# Patient Record
Sex: Female | Born: 1940
Health system: Southern US, Community
[De-identification: ages and names within clinical notes are randomized; demographics above are authoritative.]

## PROBLEM LIST (undated history)

## (undated) DIAGNOSIS — I82409 Acute embolism and thrombosis of unspecified deep veins of unspecified lower extremity: Secondary | ICD-10-CM

## (undated) DIAGNOSIS — Z95828 Presence of other vascular implants and grafts: Secondary | ICD-10-CM

## (undated) DIAGNOSIS — C801 Malignant (primary) neoplasm, unspecified: Secondary | ICD-10-CM

## (undated) DIAGNOSIS — I10 Essential (primary) hypertension: Secondary | ICD-10-CM

## (undated) DIAGNOSIS — I5189 Other ill-defined heart diseases: Secondary | ICD-10-CM

## (undated) DIAGNOSIS — C50919 Malignant neoplasm of unspecified site of unspecified female breast: Secondary | ICD-10-CM

## (undated) HISTORY — DX: Acute embolism and thrombosis of unspecified deep veins of unspecified lower extremity: I82.409

## (undated) HISTORY — DX: Other ill-defined heart diseases: I51.89

## (undated) HISTORY — PX: ABDOMINAL HYSTERECTOMY: SHX81

## (undated) HISTORY — DX: Malignant neoplasm of unspecified site of unspecified female breast: C50.919

## (undated) HISTORY — PX: BREAST BIOPSY: SHX20

## (undated) HISTORY — PX: APPENDECTOMY: SHX54

## (undated) SURGERY — EXCISION MASS
Anesthesia: General | Laterality: Right

## (undated) SURGERY — INSERTION, TUNNELED CENTRAL VENOUS DEVICE, WITH PORT
Anesthesia: IV Sedation (MBSC Only)

## (undated) SURGERY — EXCISION MASS
Anesthesia: General

---

## 2016-08-24 ENCOUNTER — Other Ambulatory Visit: Payer: Self-pay | Admitting: Family Medicine

## 2016-08-24 DIAGNOSIS — N63 Unspecified lump in unspecified breast: Secondary | ICD-10-CM

## 2016-08-25 ENCOUNTER — Ambulatory Visit (INDEPENDENT_AMBULATORY_CARE_PROVIDER_SITE_OTHER): Payer: Medicare Other | Admitting: Surgery

## 2016-08-25 ENCOUNTER — Encounter: Payer: Self-pay | Admitting: Surgery

## 2016-08-25 ENCOUNTER — Telehealth: Payer: Self-pay

## 2016-08-25 VITALS — BP 160/80 | HR 60 | Temp 98.1°F | Ht 62.0 in | Wt 212.0 lb

## 2016-08-25 DIAGNOSIS — N63 Unspecified lump in unspecified breast: Secondary | ICD-10-CM

## 2016-08-25 DIAGNOSIS — K6289 Other specified diseases of anus and rectum: Secondary | ICD-10-CM

## 2016-08-25 NOTE — Patient Instructions (Signed)
We have scheduled your appointment for your CT scan and will contact you with that appointment.  Your mammogram and ultrasound has also been scheduled and will be contacted with that appointment as well.   We will see you back in office as scheduled below.  See wound care instructions below. If you have any questions or concerns please give our office a call.   Wound Care, Adult Taking care of your wound properly can help to prevent pain and infection. It can also help your wound to heal more quickly. How is this treated? Wound care   Follow instructions from your health care provider about how to take care of your wound. Make sure you:  Wash your hands with soap and water before you change the bandage (dressing). If soap and water are not available, use hand sanitizer.  Change your dressing as told by your health care provider.  Leave stitches (sutures), skin glue, or adhesive strips in place. These skin closures may need to stay in place for 2 weeks or longer. If adhesive strip edges start to loosen and curl up, you may trim the loose edges. Do not remove adhesive strips completely unless your health care provider tells you to do that.  Check your wound area every day for signs of infection. Check for:  More redness, swelling, or pain.  More fluid or blood.  Warmth.  Pus or a bad smell.  Ask your health care provider if you should clean the wound with mild soap and water. Doing this may include:  Using a clean towel to pat the wound dry after cleaning it. Do not rub or scrub the wound.  Applying a cream or ointment. Do this only as told by your health care provider.  Covering the incision with a clean dressing.  Ask your health care provider when you can leave the wound uncovered. Medicines    If you were prescribed an antibiotic medicine, cream, or ointment, take or use the antibiotic as told by your health care provider. Do not stop taking or using the antibiotic even  if your condition improves.  Take over-the-counter and prescription medicines only as told by your health care provider. If you were prescribed pain medicine, take it at least 30 minutes before doing any wound care or as told by your health care provider. General instructions   Return to your normal activities as told by your health care provider. Ask your health care provider what activities are safe.  Do not scratch or pick at the wound.  Keep all follow-up visits as told by your health care provider. This is important.  Eat a diet that includes protein, vitamin A, vitamin C, and other nutrient-rich foods. These help the wound heal:  Protein-rich foods include meat, dairy, beans, nuts, and other sources.  Vitamin A-rich foods include carrots and dark green, leafy vegetables.  Vitamin C-rich foods include citrus, tomatoes, and other fruits and vegetables.  Nutrient-rich foods have protein, carbohydrates, fat, vitamins, or minerals. Eat a variety of healthy foods including vegetables, fruits, and whole grains. Contact a health care provider if:  You received a tetanus shot and you have swelling, severe pain, redness, or bleeding at the injection site.  Your pain is not controlled with medicine.  You have more redness, swelling, or pain around the wound.  You have more fluid or blood coming from the wound.  Your wound feels warm to the touch.  You have pus or a bad smell coming from the wound.  You have a fever or chills.  You are nauseous or you vomit.  You are dizzy. Get help right away if:  You have a red streak going away from your wound.  The edges of the wound open up and separate.  Your wound is bleeding and the bleeding does not stop with gentle pressure.  You have a rash.  You faint.  You have trouble breathing. This information is not intended to replace advice given to you by your health care provider. Make sure you discuss any questions you have with  your health care provider. Document Released: 01/07/2008 Document Revised: 11/27/2015 Document Reviewed: 10/15/2015 Elsevier Interactive Patient Education  2017 [employer].

## 2016-08-25 NOTE — Progress Notes (Signed)
Patient ID: Victoria Steele, female   DOB: Oct 13, 1940, 76 y.o.   MRN: [id number]  HPI Victoria Steele is a 76 y.o. female referring consultation by [facility] Dr. Rebeca Alert for a left breast mass. Patient reports that over the last 8 months she has had a left breast mass. She states that is gradually increasing in size. Now it is tender to palpation. She also reports that they size of the mass has fluctuated with time. She tells me that apparently there was some areas with some foul-smelling discharge but now this has healed. She experienced intermittent sharp moderate pain in the left breast with worsening with certain movement and a tight wrap. No fevers no chills. No previous mammograms or ultrasounds of her breast. No previous breast pathology. Initially patient reports that a few months ago her nipple was inverted but now is everted. Another complaint its a right buttocks mass. She is unable to see it but feels that there is a pedunculated mass. She experiences some mild intermittent pain that is dull. No specific alleviating or aggravating factors. The mass has increased in size in the last few months  HPI   No pertinent Past Medical Hx  No pertinent fam hx. No hx of breast CA, ovarian CA or breast pathology.  Social History Social History  Substance Use Topics  . Smoking status: Never Smoker  . Smokeless tobacco: Not on file  . Alcohol use No   MEDS: none  Allergies: NKDA  Review of Systems Full ROS  was asked and was negative except for the information on the HPI  Physical Exam Blood pressure (!) 160/80, pulse 60, temperature 98.1 F (36.7 C), temperature source Oral, height 5\' 2"  (1.575 m), weight 96.2 kg (212 lb). CONSTITUTIONAL: NAD EYES: Pupils are equal, round, and reactive to light, Sclera are non-icteric. EARS, NOSE, MOUTH AND THROAT: The oropharynx is clear. The oral mucosa is pink and moist. Hearing is intact to voice. LYMPH NODES:  Lymph nodes in the  neck are normal. RESPIRATORY:  Lungs are clear. There is normal respiratory effort, with equal breath sounds bilaterally, and without pathologic use of accessory muscles. CARDIOVASCULAR: Heart is regular without murmurs, gallops, or rubs. BREAST: There is a large area of induration from 4 o'clock to 10 o'clock encompassing approximately 10 cm2, incorporating the lower half of the nipple.. It is mildly tender to palpation. It is difficult to clearly distinguish between specific mass or an indurated area. There is some edema within the dermis and some erosions within the skin with tiny eschars. No definitive abscess or necrotizing infection. Nipple is everted  Axillas w/o LAD. GI: The abdomen is soft, nontender, and nondistended. There are no palpable masses. There is no hepatosplenomegaly. There are normal bowel sounds in all quadrants. GU: Rectal deferred.   MUSCULOSKELETAL: Normal muscle strength and tone. No cyanosis or edema.   SKIN:  There is a pedunculated soft tissue mass along the right buttocks this measures 5 x 3 cm in size. It is nontender and it is pedunculated dystonic measures approximately 8 mm. I do not see any infiltration into the soft tissue or heart areas within the gluteus.  NEUROLOGIC: Motor and sensation is grossly normal. Cranial nerves are grossly intact. PSYCH:  Oriented to person, place and time. Affect is normal.  Data Reviewed  I have personally reviewed the patient's imaging, laboratory findings and medical records.    Assessment /Plan 1. Right breast mass. First order of business is to rule out cancer  specifically inflammatory breast cancer given that there is significant inflammatory response and induration in that area. I will obtain an incisional biopsy of the affected area. She will also needs mammogram and ultrasound with potential needle directed biopsy into the actual breast tissue. Cuts with the patient in detail about the procedure of an incisional biopsy.  Risks benefits and possible complications she understands.  2. Right buttocks pedunculated mass. This seems to be benign in nature. But given its pedunculated nature I will obtain a CT scan of the pelvis to rule out any potential soft tissue tumor such as a rhabdo in this area.   Procedure Note  Procedure: incisional biopsy left breast   Anesthesia: lidocaine 1% w epi 5 cc  EBL: minimal  Complications: none  Patient was prepped and draped in sterile fashion and local anesthetic was injected over the area of interest. Using an 11 blade knife I took a wedge incisional biopsy of the left breast incorporating the area affected. We will send the specimen for random pathology. Because of the significant edema decided not to close the area for the possibility of a wound infection. Hemostasis obtained with pressure. Dry dressing was placed.     Caroleen Hamman, MD FACS General Surgeon 08/25/2016, 11:04 AM

## 2016-08-25 NOTE — Telephone Encounter (Signed)
Called central scheduling at this time to schedule patients CT Scan with contrast. Next Wednesday 5/23 at 8:30AM at [facility] on [address]. Patient will need to pick up her kit on 5/22 prior to the CT Scan.     Spoke with patient at this time and informed her that we have scheduled her CT Scan for 09/02/16 at 8:30. I informed her that she would need to pick up her prep kit the day before. Patient verbalized understanding.

## 2016-08-26 ENCOUNTER — Other Ambulatory Visit: Payer: Self-pay

## 2016-08-26 DIAGNOSIS — N6320 Unspecified lump in the left breast, unspecified quadrant: Secondary | ICD-10-CM

## 2016-08-27 ENCOUNTER — Telehealth: Payer: Self-pay

## 2016-08-27 ENCOUNTER — Encounter: Payer: Self-pay | Admitting: Surgery

## 2016-08-27 ENCOUNTER — Ambulatory Visit (INDEPENDENT_AMBULATORY_CARE_PROVIDER_SITE_OTHER): Payer: Medicare Other | Admitting: Surgery

## 2016-08-27 ENCOUNTER — Other Ambulatory Visit: Payer: Self-pay

## 2016-08-27 VITALS — BP 158/94 | HR 80 | Temp 97.8°F | Ht 62.0 in | Wt 212.2 lb

## 2016-08-27 DIAGNOSIS — C50912 Malignant neoplasm of unspecified site of left female breast: Secondary | ICD-10-CM | POA: Diagnosis not present

## 2016-08-27 DIAGNOSIS — N6320 Unspecified lump in the left breast, unspecified quadrant: Secondary | ICD-10-CM

## 2016-08-27 NOTE — Telephone Encounter (Signed)
Called over to Oncology at this time. Spoke with a scheduler and was informed that breast scheduler is currently working on it. I informed the patient while she was in office that this was in process and that I or the oncology office will be contacting her with this. Patient verbalized understanding.

## 2016-08-27 NOTE — Telephone Encounter (Signed)
Spoke with Dr. 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 She has ER, PR, Her2 pending and should have this result by tomorrow.   Informed Dr. Dahlia Byes of these results. He would like to see patient in office this afternoon. Appointment has been made for 3:30pm.  Call made to patient. Appointment information for today given. She verbalizes understanding.

## 2016-08-27 NOTE — Telephone Encounter (Signed)
Dr. Dahlia Byes requesting CBC, CMP and Chest X-ray to be done today. He would also like an appointment as soon as possible with Oncology. Orders placed. Referral sent.  Call made to Oncology at this time. Left voicemail with schedulers. Awaiting appointment information.

## 2016-08-27 NOTE — Patient Instructions (Addendum)
Your Oncology appointment is currently being scheduled. Either I will contact you and/or someone from Oncology will contact you with this appointment.  We have scheduled your port placement for 09/09/2016.

## 2016-08-28 ENCOUNTER — Telehealth: Payer: Self-pay | Admitting: Surgery

## 2016-08-28 ENCOUNTER — Ambulatory Visit
Admission: RE | Admit: 2016-08-28 | Discharge: 2016-08-28 | Disposition: A | Discharge status: Home / Self Care | Payer: Medicare Other | Source: Ambulatory Visit | Attending: Surgery | Admitting: Surgery

## 2016-08-28 ENCOUNTER — Ambulatory Visit: Admission: RE | Admit: 2016-08-28 | Payer: Medicare Other | Source: Ambulatory Visit | Admitting: Surgery

## 2016-08-28 ENCOUNTER — Telehealth: Payer: Self-pay

## 2016-08-28 ENCOUNTER — Other Ambulatory Visit
Admission: RE | Admit: 2016-08-28 | Discharge: 2016-08-28 | Disposition: A | Discharge status: Home / Self Care | Payer: Medicare Other | Source: Ambulatory Visit | Attending: Surgery | Admitting: Surgery

## 2016-08-28 DIAGNOSIS — N6320 Unspecified lump in the left breast, unspecified quadrant: Secondary | ICD-10-CM | POA: Insufficient documentation

## 2016-08-28 DIAGNOSIS — Z01812 Encounter for preprocedural laboratory examination: Secondary | ICD-10-CM | POA: Insufficient documentation

## 2016-08-28 DIAGNOSIS — Z01818 Encounter for other preprocedural examination: Secondary | ICD-10-CM | POA: Insufficient documentation

## 2016-08-28 LAB — CBC WITH DIFFERENTIAL/PLATELET
BASOS PCT: 1 %
Basophils Absolute: 0.1 10*3/uL (ref 0–0.1)
Eosinophils Absolute: 0.1 10*3/uL (ref 0–0.7)
Eosinophils Relative: 2 %
HEMATOCRIT: 40.2 % (ref 35.0–47.0)
Hemoglobin: 13.8 g/dL (ref 12.0–16.0)
LYMPHS PCT: 33 %
Lymphs Abs: 1.8 10*3/uL (ref 1.0–3.6)
MCH: 30.3 pg (ref 26.0–34.0)
MCHC: 34.3 g/dL (ref 32.0–36.0)
MCV: 88.3 fL (ref 80.0–100.0)
MONOS PCT: 5 %
Monocytes Absolute: 0.3 10*3/uL (ref 0.2–0.9)
NEUTROS ABS: 3 10*3/uL (ref 1.4–6.5)
Neutrophils Relative %: 59 %
Platelets: 248 10*3/uL (ref 150–440)
RBC: 4.55 MIL/uL (ref 3.80–5.20)
RDW: 14.6 % — AB (ref 11.5–14.5)
WBC: 5.3 10*3/uL (ref 3.6–11.0)

## 2016-08-28 LAB — COMPREHENSIVE METABOLIC PANEL
ALT: 33 U/L (ref 14–54)
AST: 34 U/L (ref 15–41)
Albumin: 4.1 g/dL (ref 3.5–5.0)
Alkaline Phosphatase: 237 U/L — ABNORMAL HIGH (ref 38–126)
Anion gap: 7 (ref 5–15)
BILIRUBIN TOTAL: 1.1 mg/dL (ref 0.3–1.2)
BUN: 11 mg/dL (ref 6–20)
CO2: 24 mmol/L (ref 22–32)
Calcium: 8.8 mg/dL — ABNORMAL LOW (ref 8.9–10.3)
Chloride: 106 mmol/L (ref 101–111)
Creatinine, Ser: 0.7 mg/dL (ref 0.44–1.00)
GFR calc Af Amer: >60 mL/min (ref >60)
Glucose, Bld: 128 mg/dL — ABNORMAL HIGH (ref 65–99)
POTASSIUM: 3.9 mmol/L (ref 3.5–5.1)
Sodium: 137 mmol/L (ref 135–145)
TOTAL PROTEIN: 7.3 g/dL (ref 6.5–8.1)

## 2016-08-28 NOTE — Telephone Encounter (Signed)
Reviewed results of Chest X-ray with Dr. Dahlia Byes at this time. NO further orders currently.

## 2016-08-28 NOTE — Telephone Encounter (Signed)
Patient is scheduled on 09/03/16 to see Dr. Janese Banks and the [facility] has notified patient of appointment.

## 2016-08-28 NOTE — Telephone Encounter (Signed)
Erline Levine is calling from [facility] Radiology with a verbal report. I have asked that she also fax this report to me at [phone].  No acute process. They is scleriatic change in the left scapula. Recommend CT to evaluate for skeletal metastasis.

## 2016-08-28 NOTE — Progress Notes (Signed)
Outpatient Surgical Follow Up  08/28/2016  Victoria Steele is an 76 y.o. female.   Chief Complaint  Patient presents with  . Follow-up    Left Breast Mass Biopsy    HPI: [name] is following up after a recent incisional biopsy for preceding inflammatory breast cancer. 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 HER-2/neu is pending, ER and PR still pending. Explained to detail about her new diagnosis and because of her clinical diagnosis of inflammatory breast cancer is is that usually neoadjuvant therapy first. Obviously the size of the cancer precludes a mastectomy  as well. Discussed in detail with Dr. Janese Banks from oncology and she will see her next week after she gets her nomogram and ultrasound for staging poor processes and also to measure more objectively the extent of the cancer. Currently she is doing well from her recent incisional biopsy.   History reviewed. No pertinent past medical history.  History reviewed. No pertinent surgical history.  History reviewed. No pertinent family history.  Social History:  reports that she has never smoked. She has never used smokeless tobacco. She reports that she does not drink alcohol or use drugs.  Allergies: Not on File  Medications reviewed.    ROS Full ROS performed and is otherwise negative other than what is stated in HPI   BP (!) 158/94   Pulse 80   Temp 97.8 F (36.6 C) (Oral)   Ht _0  (1.575 m)   Wt 96.3 kg (212 lb 3.2 oz)   BMI 38.81 kg/m   Physical Exam  Constitutional: She is oriented to person, place, and time and well-developed, well-nourished, and in no distress. No distress.  Pulmonary/Chest: Effort normal. No respiratory distress.  Abdominal: She exhibits no distension. There is no tenderness. There is no rebound and no guarding.  Musculoskeletal: Normal range of motion. She exhibits no edema.  Neurological: She is alert and oriented to  person, place, and time. Gait normal. GCS score is 15.  Skin: Skin is warm and dry.  Psychiatric: Mood, memory, affect and judgment normal.  Nursing note and vitals reviewed. Breast: left breast mass, indurated biopsy site intact. 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 Clinically no evidence of metastatic disease. Pending mammogram and ultrasound . I will order a CBC, CMP and a chest x-ray as part of the staging process. I will let Dr. Janese Banks from oncology to further staging imaging studies as clinically indicated. Since we already have a diagnosis of cancer I have also discussed with her in detail about the need for a port placement. Discussed with the patient in detail about the risks benefits and possible complications including but not limited to: Bleeding, infection, pneumothorax, vascular injury. She understands and wishes to proceed. We'll schedule her for port in about 10 days to expedite her chemotherapy treatment. We will obviously await further markers from the pathology report and the mammogram. Extensive counseling provided and I spent greater than 40 minutes in this encounter with greater than 50% of the time spent in counseling and coordination of care.  Caroleen Hamman, MD [facility] General Surgeon

## 2016-08-28 NOTE — Telephone Encounter (Signed)
Pt advised of pre op date/time and sx date. Sx: 09/10/16 with Dr Jeremy Johann Placement.  Pre op: 09/03/16 (phone) between 9-1:00pm  Patient made aware to call [phone], between 1-3:00pm the day before surgery, to find out what time to arrive.    No authorization is required with [employer] number: [id number].

## 2016-08-31 ENCOUNTER — Ambulatory Visit
Admission: RE | Admit: 2016-08-31 | Discharge: 2016-08-31 | Disposition: A | Discharge status: Home / Self Care | Payer: Medicare Other | Source: Ambulatory Visit | Attending: Family Medicine | Admitting: Family Medicine

## 2016-08-31 ENCOUNTER — Telehealth: Payer: Self-pay

## 2016-08-31 DIAGNOSIS — N6453 Retraction of nipple: Secondary | ICD-10-CM | POA: Diagnosis not present

## 2016-08-31 DIAGNOSIS — N63 Unspecified lump in unspecified breast: Secondary | ICD-10-CM | POA: Diagnosis present

## 2016-08-31 DIAGNOSIS — N6320 Unspecified lump in the left breast, unspecified quadrant: Secondary | ICD-10-CM | POA: Insufficient documentation

## 2016-08-31 DIAGNOSIS — N6489 Other specified disorders of breast: Secondary | ICD-10-CM | POA: Diagnosis not present

## 2016-08-31 DIAGNOSIS — C50912 Malignant neoplasm of unspecified site of left female breast: Secondary | ICD-10-CM | POA: Diagnosis not present

## 2016-08-31 LAB — SURGICAL PATHOLOGY

## 2016-08-31 NOTE — Telephone Encounter (Signed)
Patient returned my call at this time. Asking when she needed to get her EKG test completed. I informed her that she has not been scheduled for an EKG that she may or may not have to have one when she comes in for her pre-admission appointment on the 24th. I also informed her that she will have an oncology appointment on the 24th as well. Patient verbalized understanding of these upcoming appointments.

## 2016-08-31 NOTE — Telephone Encounter (Signed)
Patient is calling for [employer] or [employer]. She has a list of things she has to have done. She has already completed her lab work and her chest x-ray. She is scheduled for her ultrasounds today and CT on Wednesday.   Patient is calling because she thought she also had to get an EKG. She would like to know if she does or does not and if she does, where does she go in for that. Please call patient and advice. She would like to be left a voicemail if she does not pick up

## 2016-08-31 NOTE — Telephone Encounter (Signed)
Return patient's call at this time referring to EKG appointment. Left a message for her to call back.

## 2016-09-02 ENCOUNTER — Ambulatory Visit
Admission: RE | Admit: 2016-09-02 | Discharge: 2016-09-02 | Disposition: A | Discharge status: Home / Self Care | Payer: Medicare Other | Source: Ambulatory Visit | Attending: Surgery | Admitting: Surgery

## 2016-09-02 DIAGNOSIS — K6289 Other specified diseases of anus and rectum: Secondary | ICD-10-CM | POA: Diagnosis not present

## 2016-09-02 DIAGNOSIS — C7951 Secondary malignant neoplasm of bone: Secondary | ICD-10-CM | POA: Insufficient documentation

## 2016-09-02 DIAGNOSIS — Z90710 Acquired absence of both cervix and uterus: Secondary | ICD-10-CM | POA: Diagnosis not present

## 2016-09-02 DIAGNOSIS — K5730 Diverticulosis of large intestine without perforation or abscess without bleeding: Secondary | ICD-10-CM | POA: Diagnosis not present

## 2016-09-02 DIAGNOSIS — I700 Atherosclerosis of aorta: Secondary | ICD-10-CM | POA: Diagnosis not present

## 2016-09-02 DIAGNOSIS — K760 Fatty (change of) liver, not elsewhere classified: Secondary | ICD-10-CM | POA: Insufficient documentation

## 2016-09-02 MED ORDER — IOPAMIDOL (ISOVUE-300) INJECTION 61%
100.0000 mL | Freq: Once | INTRAVENOUS | Status: AC | PRN
  Administered 2016-09-02: 100 mL via INTRAVENOUS

## 2016-09-03 ENCOUNTER — Inpatient Hospital Stay: Payer: Medicare Other | Attending: Oncology | Admitting: Oncology

## 2016-09-03 ENCOUNTER — Other Ambulatory Visit: Payer: Self-pay

## 2016-09-03 ENCOUNTER — Encounter
Admission: RE | Admit: 2016-09-03 | Discharge: 2016-09-03 | Disposition: A | Discharge status: Home / Self Care | Payer: Medicare Other | Source: Ambulatory Visit | Attending: Surgery | Admitting: Surgery

## 2016-09-03 VITALS — HR 79 | Temp 98.2°F | Wt 211.8 lb

## 2016-09-03 DIAGNOSIS — K760 Fatty (change of) liver, not elsewhere classified: Secondary | ICD-10-CM | POA: Insufficient documentation

## 2016-09-03 DIAGNOSIS — C50912 Malignant neoplasm of unspecified site of left female breast: Secondary | ICD-10-CM

## 2016-09-03 DIAGNOSIS — C7951 Secondary malignant neoplasm of bone: Secondary | ICD-10-CM | POA: Diagnosis not present

## 2016-09-03 DIAGNOSIS — K5730 Diverticulosis of large intestine without perforation or abscess without bleeding: Secondary | ICD-10-CM | POA: Insufficient documentation

## 2016-09-03 DIAGNOSIS — Z7189 Other specified counseling: Secondary | ICD-10-CM

## 2016-09-03 DIAGNOSIS — I4510 Unspecified right bundle-branch block: Secondary | ICD-10-CM | POA: Insufficient documentation

## 2016-09-03 DIAGNOSIS — C50112 Malignant neoplasm of central portion of left female breast: Secondary | ICD-10-CM | POA: Diagnosis not present

## 2016-09-03 DIAGNOSIS — Z01818 Encounter for other preprocedural examination: Secondary | ICD-10-CM | POA: Insufficient documentation

## 2016-09-03 DIAGNOSIS — Z90710 Acquired absence of both cervix and uterus: Secondary | ICD-10-CM | POA: Diagnosis not present

## 2016-09-03 DIAGNOSIS — Z170 Estrogen receptor positive status [ER+]: Secondary | ICD-10-CM | POA: Insufficient documentation

## 2016-09-03 DIAGNOSIS — N6320 Unspecified lump in the left breast, unspecified quadrant: Secondary | ICD-10-CM

## 2016-09-03 HISTORY — DX: Malignant (primary) neoplasm, unspecified: C80.1

## 2016-09-03 LAB — COMPREHENSIVE METABOLIC PANEL
ALT: 31 U/L (ref 14–54)
AST: 33 U/L (ref 15–41)
Albumin: 4.1 g/dL (ref 3.5–5.0)
Alkaline Phosphatase: 258 U/L — ABNORMAL HIGH (ref 38–126)
Anion gap: 5 (ref 5–15)
BUN: 13 mg/dL (ref 6–20)
CHLORIDE: 105 mmol/L (ref 101–111)
CO2: 25 mmol/L (ref 22–32)
Calcium: 9.2 mg/dL (ref 8.9–10.3)
Creatinine, Ser: 0.89 mg/dL (ref 0.44–1.00)
GFR calc Af Amer: >60 mL/min (ref >60)
GLUCOSE: 143 mg/dL — AB (ref 65–99)
POTASSIUM: 3.8 mmol/L (ref 3.5–5.1)
Sodium: 135 mmol/L (ref 135–145)
Total Bilirubin: 0.9 mg/dL (ref 0.3–1.2)
Total Protein: 7.6 g/dL (ref 6.5–8.1)

## 2016-09-03 LAB — CBC WITH DIFFERENTIAL/PLATELET
Basophils Absolute: 0 10*3/uL (ref 0–0.1)
Basophils Relative: 1 %
EOS ABS: 0.1 10*3/uL (ref 0–0.7)
EOS PCT: 2 %
HCT: 41 % (ref 35.0–47.0)
Hemoglobin: 14.2 g/dL (ref 12.0–16.0)
LYMPHS ABS: 1.8 10*3/uL (ref 1.0–3.6)
LYMPHS PCT: 29 %
MCH: 30.6 pg (ref 26.0–34.0)
MCHC: 34.7 g/dL (ref 32.0–36.0)
MCV: 88.3 fL (ref 80.0–100.0)
MONO ABS: 0.3 10*3/uL (ref 0.2–0.9)
MONOS PCT: 6 %
Neutro Abs: 3.8 10*3/uL (ref 1.4–6.5)
Neutrophils Relative %: 62 %
PLATELETS: 246 10*3/uL (ref 150–440)
RBC: 4.64 MIL/uL (ref 3.80–5.20)
RDW: 14.8 % — AB (ref 11.5–14.5)
WBC: 6 10*3/uL (ref 3.6–11.0)

## 2016-09-03 NOTE — Patient Instructions (Signed)
  Your procedure is scheduled on: 09-10-16 THURSDAY Report to Same Day Surgery 2nd floor medical mall [facility] Entrance-take elevator on left to 2nd floor.  Check in with surgery information desk.) To find out your arrival time please call [phone] between 1PM - 3PM on 09-09-16 [facility]  Remember: Instructions that are not followed completely may result in serious medical risk, up to and including death, or upon the discretion of your surgeon and anesthesiologist your surgery may need to be rescheduled.    _x___ 1. Do not eat food or drink liquids after midnight. No gum chewing or hard candies.     __x__ 2. No Alcohol for 24 hours before or after surgery.   __x__3. No Smoking for 24 prior to surgery.   ____  4. Bring all medications with you on the day of surgery if instructed.    __x__ 5. Notify your doctor if there is any change in your medical condition     (cold, fever, infections).     Do not wear jewelry, make-up, hairpins, clips or nail polish.  Do not wear lotions, powders, or perfumes. You may wear deodorant.  Do not shave 48 hours prior to surgery. Men may shave face and neck.  Do not bring valuables to the hospital.    [facility] is not responsible for any belongings or valuables.               Contacts, dentures or bridgework may not be worn into surgery.  Leave your suitcase in the car. After surgery it may be brought to your room.  For patients admitted to the hospital, discharge time is determined by your treatment team.   Patients discharged the day of surgery will not be allowed to drive home.  You will need someone to drive you home and stay with you the night of your procedure.    Please read over the following fact sheets that you were given:     ____ Take anti-hypertensive (unless it includes a diuretic), cardiac, seizure, asthma,     anti-reflux and psychiatric medicines. These include:  1. NONE  2.  3.  4.  5.  6.  ____Fleets enema or  Magnesium Citrate as directed.   _x___ Use CHG Soap or sage wipes as directed on instruction sheet   ____ Use inhalers on the day of surgery and bring to hospital day of surgery  ____ Stop Metformin and Janumet 2 days prior to surgery.    ____ Take 1/2 of usual insulin dose the night before surgery and none on the morning surgery.   ____ Follow recommendations from Cardiologist, Pulmonologist or PCP regarding stopping Aspirin, Coumadin, Pllavix ,Eliquis, Effient, or Pradaxa, and Pletal.  X____Stop Anti-inflammatories such as Advil, Aleve, Ibuprofen, Motrin, Naproxen, Naprosyn, Goodies powders, EXCEDRIN or aspirin products NOW-OK to take Tylenol    ____ Stop supplements until after surgery.    ____ Bring C-Pap to the hospital.

## 2016-09-04 ENCOUNTER — Encounter: Payer: Self-pay | Admitting: Oncology

## 2016-09-04 DIAGNOSIS — C7951 Secondary malignant neoplasm of bone: Secondary | ICD-10-CM | POA: Insufficient documentation

## 2016-09-04 DIAGNOSIS — Z7189 Other specified counseling: Secondary | ICD-10-CM | POA: Insufficient documentation

## 2016-09-04 DIAGNOSIS — C50919 Malignant neoplasm of unspecified site of unspecified female breast: Secondary | ICD-10-CM | POA: Insufficient documentation

## 2016-09-04 LAB — CANCER ANTIGEN 27.29: CA 27.29: 563.2 U/mL — ABNORMAL HIGH (ref 0.0–38.6)

## 2016-09-04 MED ORDER — ONDANSETRON HCL 8 MG PO TABS: 8.0000 mg | ORAL_TABLET | Freq: Two times a day (BID) | ORAL | 1 refills | Status: DC | PRN

## 2016-09-04 MED ORDER — DEXAMETHASONE 4 MG PO TABS: ORAL_TABLET | ORAL | 1 refills | Status: DC

## 2016-09-04 MED ORDER — LORAZEPAM 0.5 MG PO TABS: 0.5000 mg | ORAL_TABLET | Freq: Four times a day (QID) | ORAL | 0 refills | Status: DC | PRN

## 2016-09-04 MED ORDER — LIDOCAINE-PRILOCAINE 2.5-2.5 % EX CREA: TOPICAL_CREAM | CUTANEOUS | 3 refills | Status: DC

## 2016-09-04 MED ORDER — PROCHLORPERAZINE MALEATE 10 MG PO TABS: 10.0000 mg | ORAL_TABLET | Freq: Four times a day (QID) | ORAL | 1 refills | Status: DC | PRN

## 2016-09-04 NOTE — Progress Notes (Signed)
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 Dahlia Byes  Date of visit: 09/04/16   History of presenting illness- 1. Patient is a 76 year old female with no significant comorbidities who noticed left breast mass about 6-9 months ago. She thought it would go away but it continued to increase in size and began to involve her skin. She also noted tenderness to palpation as well as intermittent sharp tingling pain. She was seen by Dr. Adora Fridge on 08/25/2016 and patient had a biopsy of her skin in his office which showed:DIAGNOSIS:  A. LEFT BREAST SKIN; EXCISION:  - INVASIVE CARCINOMA MORPHOLOGICALLY CONSISTENT WITH MAMMARY ORIGIN  INVOLVING THE DERMIS.   BREAST BIOMARKER TESTS  Estrogen Receptor (ER) Status: POSITIVE, >90%  Progesterone Receptor (PgR) Status: POSITIVE, >70%  Her2 negative  2. Patient underwent bilateral diagnostic mammogram on 08/31/2016 showed:IMPRESSION: 1. Known left breast cancer, retroareolar with probable extension to the nipple, measuring 4.3 cm greatest dimension by ultrasound, with associated architectural distortion and associated left nipple retraction, and with associated diffuse skin thickening on the left. 2. No enlarged or morphologically abnormal lymph nodes are seen in the left axilla by ultrasound. 3. No evidence of malignancy within the right breast.  3. Patient was also complaining of some perirectal pain and underwent CT abdomen pelvis with contrast on 09/02/2016 which showed: IMPRESSION: Left colonic diverticulosis.  No active diverticulitis.Fatty liver. No acute findings or evidence of metastatic disease in the abdomen or pelvis. Diffuse bony sclerotic  metastases.  4. Other than that her breast pain patient overall feels well. Denies any unintentional weight loss. She continues to be active and care for her great-grandchildren. She has had a hysterectomy in the past. No family history of breast or ovarian cancer.   . ECOG PS- 0  Pain scale- 0   Review of systems- Review of Systems  Constitutional: Negative for chills, fever, malaise/fatigue and weight loss.  HENT: Negative for congestion, ear discharge and nosebleeds.   Eyes: Negative for blurred vision.  Respiratory: Negative for cough, hemoptysis, sputum production, shortness of breath and wheezing.   Cardiovascular: Negative for chest pain, palpitations, orthopnea and claudication.  Gastrointestinal: Negative for abdominal pain, blood in stool, constipation, diarrhea, heartburn, melena, nausea and vomiting.  Genitourinary: Negative for dysuria, flank pain, frequency, hematuria and urgency.  Musculoskeletal: Negative for back pain, joint pain and myalgias.  Skin: Negative for rash.  Neurological: Negative for dizziness, tingling, focal weakness, seizures, weakness and headaches.  Endo/Heme/Allergies: Does not bruise/bleed easily.  Psychiatric/Behavioral: Negative for depression and suicidal ideas. The patient does not have insomnia.     No Known Allergies  There are no active problems to display for this patient.    Past Medical History:  Diagnosis Date  . Cancer [facility])      Past Surgical History:  Procedure Laterality Date  . ABDOMINAL HYSTERECTOMY      Social History   Social History  . Marital status: Married    Spouse name: N/A  . Number of children: N/A  . Years of education: N/A   Occupational History  . Not on file.   Social History Main Topics  . Smoking status: Never Smoker  . Smokeless tobacco: Never Used  . Alcohol use No  .  Drug use: No  . Sexual activity: No   Other Topics Concern  . Not on file   Social History Narrative  . No  narrative on file     History reviewed. No pertinent family history.   Current Outpatient Prescriptions:  .  Aspirin-Acetaminophen-Caffeine (EXCEDRIN EXTRA STRENGTH PO), Take 1 tablet by mouth daily as needed (pain)., Disp: , Rfl:    Physical exam:  Vitals:   09/03/16 1435  Pulse: 79  Temp: 98.2 F (36.8 C)  TempSrc: Tympanic  Weight: 211 lb 12.8 oz (96.1 kg)   Physical Exam  Constitutional: She is oriented to person, place, and time and well-developed, well-nourished, and in no distress.  HENT:  Head: Normocephalic and atraumatic.  Eyes: EOM are normal. Pupils are equal, round, and reactive to light.  Neck: Normal range of motion.  Cardiovascular: Normal rate, regular rhythm and normal heart sounds.   Pulmonary/Chest: Effort normal and breath sounds normal.  Abdominal: Soft. Bowel sounds are normal.  Neurological: She is alert and oriented to person, place, and time.  Skin: Skin is warm and dry.   There is a palpable mass in the left breast just beneath the nipple and areola which is involving the skin and causing superficial ulceration. There is no drainage or nipple discharge. No palpable b/l adenopathy. There is no erythema or inflammation of surrounding skin over left breast.    CMP Latest Ref Rng & Units 09/03/2016  Glucose 65 - 99 mg/dL 143(H)  BUN 6 - 20 mg/dL 13  Creatinine 0.44 - 1.00 mg/dL 0.89  Sodium 135 - 145 mmol/L 135  Potassium 3.5 - 5.1 mmol/L 3.8  Chloride 101 - 111 mmol/L 105  CO2 22 - 32 mmol/L 25  Calcium 8.9 - 10.3 mg/dL 9.2  Total Protein 6.5 - 8.1 g/dL 7.6  Total Bilirubin 0.3 - 1.2 mg/dL 0.9  Alkaline Phos 38 - 126 U/L 258(H)  AST 15 - 41 U/L 33  ALT 14 - 54 U/L 31   CBC Latest Ref Rng & Units 09/03/2016  WBC 3.6 - 11.0 K/uL 6.0  Hemoglobin 12.0 - 16.0 g/dL 14.2  Hematocrit 35.0 - 47.0 % 41.0  Platelets 150 - 440 K/uL 246    No images are attached to the encounter.  Dg Chest 2 View  Result Date: 08/28/2016 CLINICAL DATA:  Preop for  breast lump removal EXAM: CHEST  2 VIEW COMPARISON:  None. FINDINGS: Normal cardiac silhouette. There is chronic bronchitic markings in the lungs. No focal infiltrate or pneumothorax. No pulmonary edema. Sclerotic changes in the LEFT shoulder and scapula. IMPRESSION: 1. No acute cardiopulmonary process. 2. Sclerotic change in the LEFT scapula and proximal humerus. Recommend CT to evaluate for sclerotic skeletal metastasis. These results will be called to the ordering clinician or representative by the Radiologist Assistant, and communication documented in the PACS or zVision Dashboard. Electronically Signed   By: Suzy Bouchard M.D.   On: 08/28/2016 09:56   Ct Abdomen Pelvis W Contrast  Result Date: 09/02/2016 CLINICAL DATA:  Right perirectal swelling. Newly diagnosed left breast cancer EXAM: CT ABDOMEN AND PELVIS WITH CONTRAST TECHNIQUE: Multidetector CT imaging of the abdomen and pelvis was performed using the standard protocol following bolus administration of intravenous contrast. CONTRAST:  185m ISOVUE-300 IOPAMIDOL (ISOVUE-300) INJECTION 61% COMPARISON:  None. FINDINGS: Lower chest: Linear scarring in the left base. No effusions. Small nodule at the right base on image 9 measures 3 mm. Heart is normal size. Hepatobiliary: Fatty infiltration of the liver. No focal  abnormality or biliary ductal dilatation. Gallbladder unremarkable. Pancreas: No focal abnormality or ductal dilatation. Spleen: No focal abnormality.  Normal size. Adrenals/Urinary Tract: No adrenal abnormality. No focal renal abnormality. No stones or hydronephrosis. Urinary bladder is unremarkable. Stomach/Bowel: Descending colonic and sigmoid diverticulosis. No active diverticulitis. Stomach and small bowel unremarkable. Vascular/Lymphatic: No evidence of aneurysm or adenopathy. Aortic calcifications. Reproductive: Prior hysterectomy.  No adnexal masses. Other: No free fluid or free air. No visible soft tissue abnormality in the perirectal  region. Musculoskeletal: Diffuse sclerosis throughout the entire visualized spine and bony pelvis is well is lower ribs compatible with diffuse sclerotic metastases. IMPRESSION: Left colonic diverticulosis.  No active diverticulitis. Fatty liver. No acute findings or evidence of metastatic disease in the abdomen or pelvis. Diffuse bony sclerotic metastases. Electronically Signed   By: Rolm Baptise M.D.   On: 09/02/2016 09:53   US Breast Ltd Uni Left Inc Axilla  Result Date: 08/31/2016 CLINICAL DATA:  Recently diagnosed inflammatory breast cancer via skin punch biopsy. Baseline mammogram. EXAM: 2D DIGITAL DIAGNOSTIC BILATERAL MAMMOGRAM WITH CAD AND ADJUNCT TOMO ULTRASOUND LEFT BREAST COMPARISON:  None. ACR Breast Density Category b: There are scattered areas of fibroglandular density. FINDINGS: There is a spiculated mass within the retroareolar left breast, measuring 4-5 cm greatest dimension, with associated architectural distortion, probable extension to the nipple, with associated left nipple retraction and diffuse skin thickening on the left. There are no dominant masses, suspicious calcifications or secondary signs of malignancy within the right breast. Mammographic images were processed with CAD. Targeted ultrasound is performed, showing an ill-defined hypoechoic area in the subareolar left breast, measuring up to 4.3 cm greatest dimension, corresponding to the mammographic finding and known invasive carcinoma. Left axilla was also evaluated with ultrasound showing no enlarged or morphologically abnormal lymph nodes. IMPRESSION: 1. Known left breast cancer, retroareolar with probable extension to the nipple, measuring 4.3 cm greatest dimension by ultrasound, with associated architectural distortion and associated left nipple retraction, and with associated diffuse skin thickening on the left. 2. No enlarged or morphologically abnormal lymph nodes are seen in the left axilla by ultrasound. 3. No evidence of  malignancy within the right breast. RECOMMENDATION: Per current treatment plan for patient's known left breast cancer. I have discussed the findings and recommendations with the patient. Results were also provided in writing at the conclusion of the visit. If applicable, a reminder letter will be sent to the patient regarding the next appointment. BI-RADS CATEGORY  6: Known biopsy-proven malignancy. Electronically Signed   By: Franki Cabot M.D.   On: 08/31/2016 15:46   Mm Diag Breast Tomo Bilateral  Result Date: 08/31/2016 CLINICAL DATA:  Recently diagnosed inflammatory breast cancer via skin punch biopsy. Baseline mammogram. EXAM: 2D DIGITAL DIAGNOSTIC BILATERAL MAMMOGRAM WITH CAD AND ADJUNCT TOMO ULTRASOUND LEFT BREAST COMPARISON:  None. ACR Breast Density Category b: There are scattered areas of fibroglandular density. FINDINGS: There is a spiculated mass within the retroareolar left breast, measuring 4-5 cm greatest dimension, with associated architectural distortion, probable extension to the nipple, with associated left nipple retraction and diffuse skin thickening on the left. There are no dominant masses, suspicious calcifications or secondary signs of malignancy within the right breast. Mammographic images were processed with CAD. Targeted ultrasound is performed, showing an ill-defined hypoechoic area in the subareolar left breast, measuring up to 4.3 cm greatest dimension, corresponding to the mammographic finding and known invasive carcinoma. Left axilla was also evaluated with ultrasound showing no enlarged or morphologically abnormal lymph nodes. IMPRESSION: 1.  Known left breast cancer, retroareolar with probable extension to the nipple, measuring 4.3 cm greatest dimension by ultrasound, with associated architectural distortion and associated left nipple retraction, and with associated diffuse skin thickening on the left. 2. No enlarged or morphologically abnormal lymph nodes are seen in the left  axilla by ultrasound. 3. No evidence of malignancy within the right breast. RECOMMENDATION: Per current treatment plan for patient's known left breast cancer. I have discussed the findings and recommendations with the patient. Results were also provided in writing at the conclusion of the visit. If applicable, a reminder letter will be sent to the patient regarding the next appointment. BI-RADS CATEGORY  6: Known biopsy-proven malignancy. Electronically Signed   By: Franki Cabot M.D.   On: 08/31/2016 15:46    Assessment and plan- Patient is a 76 y.o. female referred to [place] for left breast inflammatory breast cancer. 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 Clinically patient has inflammatory left breast cancer that CT abdomen also shows bone metastases consistent with stage IV disease. Today I will obtain CBC, CMP and CA-27-29. I will also obtain baseline PET/CT scan for complete staging workup. Typically for stage IV disease, breast primary is not resected. However given that patient has inflammatory breast cancer, toilet mastectomy could be considered in the near future. I would recommend doing palliative chemotherapy with dose dense adriamycin and cytoxan Q2 weeks X 4 cycles and then switch her to hormone therapy with ibrance and letrozole given no evidence of visceral disease on CT abdomen. If she has good response to treatment, mastectomy could be considered to prevent skin breakdown. I explained the risks and benefits of chemotherapy including all but not limited to nausea, vomiting, low blood counts and risk of cardiac toxicity associated with doxorubicin. I will obtain baseline echocardiogram prior to starting chemotherapy. Patient understands the risks and benefits of chemotherapy and agrees to proceed as planned. She will be  getting port placement next week and we will tentatively plan to start chemotherapy in 2 weeks from now. Chemotherapy will be given a palliative and not a curative intent.   I will discuss zometa for bone metastases during next visit. Patient was quite overwhelmed with all the information given today   Total face to face encounter time for this patient visit was 45 min. >50% of the time was  spent in counseling and coordination of care.     Thank you for this kind referral and the opportunity to participate in the care of this patient   Visit Diagnosis 1. Primary cancer of left breast with metastasis to other site [facility])   2. Bone metastases ([place])   3. Goals of care, counseling/discussion     Dr. Randa Evens, MD, MPH [facility] Pager- [phone] 09/04/2016
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
## 2016-09-08 ENCOUNTER — Telehealth: Payer: Self-pay | Admitting: *Deleted

## 2016-09-08 ENCOUNTER — Inpatient Hospital Stay: Payer: Medicare Other

## 2016-09-08 ENCOUNTER — Encounter: Payer: Self-pay | Admitting: *Deleted

## 2016-09-08 DIAGNOSIS — C801 Malignant (primary) neoplasm, unspecified: Secondary | ICD-10-CM | POA: Insufficient documentation

## 2016-09-08 NOTE — Telephone Encounter (Signed)
Got a call from pt that she needs emla cream sent to her pharmacy. I checked and all the meds were released on 5/25 and went to pharmacy.  The only thing that did not go through was lorazepam. I have called it into drugstore and left message of the drug.

## 2016-09-08 NOTE — Patient Instructions (Signed)
Doxorubicin injection What is this medicine? DOXORUBICIN (dox oh ROO bi sin) is a chemotherapy drug. It is used to treat many kinds of cancer like leukemia, lymphoma, neuroblastoma, sarcoma, and Wilms' tumor. It is also used to treat bladder cancer, breast cancer, lung cancer, ovarian cancer, stomach cancer, and thyroid cancer. This medicine may be used for other purposes; ask your health care provider or pharmacist if you have questions. COMMON BRAND NAME(S): Adriamycin, Adriamycin PFS, Adriamycin RDF, Rubex What should I tell my health care provider before I take this medicine? They need to know if you have any of these conditions: -heart disease -history of low blood counts caused by a medicine -liver disease -recent or ongoing radiation therapy -an unusual or allergic reaction to doxorubicin, other chemotherapy agents, other medicines, foods, dyes, or preservatives -pregnant or trying to get pregnant -breast-feeding How should I use this medicine? This drug is given as an infusion into a vein. It is administered in a hospital or clinic by a specially trained health care professional. If you have pain, swelling, burning or any unusual feeling around the site of your injection, tell your health care professional right away. Talk to your pediatrician regarding the use of this medicine in children. Special care may be needed. Overdosage: If you think you have taken too much of this medicine contact a poison control center or emergency room at once. NOTE: This medicine is only for you. Do not share this medicine with others. What if I miss a dose? It is important not to miss your dose. Call your doctor or health care professional if you are unable to keep an appointment. What may interact with this medicine? This medicine may interact with the following medications: -6-mercaptopurine -paclitaxel -phenytoin -St. John's Wort -trastuzumab -verapamil This list may not describe all possible  interactions. Give your health care provider a list of all the medicines, herbs, non-prescription drugs, or dietary supplements you use. Also tell them if you smoke, drink alcohol, or use illegal drugs. Some items may interact with your medicine. What should I watch for while using this medicine? This drug may make you feel generally unwell. This is not uncommon, as chemotherapy can affect healthy cells as well as cancer cells. Report any side effects. Continue your course of treatment even though you feel ill unless your doctor tells you to stop. There is a maximum amount of this medicine you should receive throughout your life. The amount depends on the medical condition being treated and your overall health. Your doctor will watch how much of this medicine you receive in your lifetime. Tell your doctor if you have taken this medicine before. You may need blood work done while you are taking this medicine. Your urine may turn red for a few days after your dose. This is not blood. If your urine is dark or brown, call your doctor. In some cases, you may be given additional medicines to help with side effects. Follow all directions for their use. Call your doctor or health care professional for advice if you get a fever, chills or sore throat, or other symptoms of a cold or flu. Do not treat yourself. This drug decreases your body's ability to fight infections. Try to avoid being around people who are sick. This medicine may increase your risk to bruise or bleed. Call your doctor or health care professional if you notice any unusual bleeding. Talk to your doctor about your risk of cancer. You may be more at risk for certain   types of cancers if you take this medicine. Do not become pregnant while taking this medicine or for 6 months after stopping it. Women should inform their doctor if they wish to become pregnant or think they might be pregnant. Men should not father a child while taking this medicine and  for 6 months after stopping it. There is a potential for serious side effects to an unborn child. Talk to your health care professional or pharmacist for more information. Do not breast-feed an infant while taking this medicine. This medicine has caused ovarian failure in some women and reduced sperm counts in some men This medicine may interfere with the ability to have a child. Talk with your doctor or health care professional if you are concerned about your fertility. What side effects may I notice from receiving this medicine? Side effects that you should report to your doctor or health care professional as soon as possible: -allergic reactions like skin rash, itching or hives, swelling of the face, lips, or tongue -breathing problems -chest pain -fast or irregular heartbeat -low blood counts - this medicine may decrease the number of white blood cells, red blood cells and platelets. You may be at increased risk for infections and bleeding. -pain, redness, or irritation at site where injected -signs of infection - fever or chills, cough, sore throat, pain or difficulty passing urine -signs of decreased platelets or bleeding - bruising, pinpoint red spots on the skin, black, tarry stools, blood in the urine -swelling of the ankles, feet, hands -tiredness -weakness Side effects that usually do not require medical attention (report to your doctor or health care professional if they continue or are bothersome): -diarrhea -hair loss -mouth sores -nail discoloration or damage -nausea -red colored urine -vomiting This list may not describe all possible side effects. Call your doctor for medical advice about side effects. You may report side effects to FDA at 1-800-FDA-1088. Where should I keep my medicine? This drug is given in a hospital or clinic and will not be stored at home. NOTE: This sheet is a summary. It may not cover all possible information. If you have questions about this  medicine, talk to your doctor, pharmacist, or health care provider.  2018 [employer] Standard (2015-05-27 11:28:51) Cyclophosphamide injection What is this medicine? CYCLOPHOSPHAMIDE (sye kloe FOSS fa mide) is a chemotherapy drug. It slows the growth of cancer cells. This medicine is used to treat many types of cancer like lymphoma, myeloma, leukemia, breast cancer, and ovarian cancer, to name a few. This medicine may be used for other purposes; ask your health care provider or pharmacist if you have questions. COMMON BRAND NAME(S): Cytoxan, Neosar What should I tell my health care provider before I take this medicine? They need to know if you have any of these conditions: -blood disorders -history of other chemotherapy -infection -kidney disease -liver disease -recent or ongoing radiation therapy -tumors in the bone marrow -an unusual or allergic reaction to cyclophosphamide, other chemotherapy, other medicines, foods, dyes, or preservatives -pregnant or trying to get pregnant -breast-feeding How should I use this medicine? This drug is usually given as an injection into a vein or muscle or by infusion into a vein. It is administered in a hospital or clinic by a specially trained health care professional. Talk to your pediatrician regarding the use of this medicine in children. Special care may be needed. Overdosage: If you think you have taken too much of this medicine contact a poison control center or emergency room at   once. NOTE: This medicine is only for you. Do not share this medicine with others. What if I miss a dose? It is important not to miss your dose. Call your doctor or health care professional if you are unable to keep an appointment. What may interact with this medicine? This medicine may interact with the following medications: -amiodarone -amphotericin B -azathioprine -certain antiviral medicines for HIV or AIDS such as protease inhibitors (e.g., indinavir,  ritonavir) and zidovudine -certain blood pressure medications such as benazepril, captopril, enalapril, fosinopril, lisinopril, moexipril, monopril, perindopril, quinapril, ramipril, trandolapril -certain cancer medications such as anthracyclines (e.g., daunorubicin, doxorubicin), busulfan, cytarabine, paclitaxel, pentostatin, tamoxifen, trastuzumab -certain diuretics such as chlorothiazide, chlorthalidone, hydrochlorothiazide, indapamide, metolazone -certain medicines that treat or prevent blood clots like warfarin -certain muscle relaxants such as succinylcholine -cyclosporine -etanercept -indomethacin -medicines to increase blood counts like filgrastim, pegfilgrastim, sargramostim -medicines used as general anesthesia -metronidazole -natalizumab This list may not describe all possible interactions. Give your health care provider a list of all the medicines, herbs, non-prescription drugs, or dietary supplements you use. Also tell them if you smoke, drink alcohol, or use illegal drugs. Some items may interact with your medicine. What should I watch for while using this medicine? Visit your doctor for checks on your progress. This drug may make you feel generally unwell. This is not uncommon, as chemotherapy can affect healthy cells as well as cancer cells. Report any side effects. Continue your course of treatment even though you feel ill unless your doctor tells you to stop. Drink water or other fluids as directed. Urinate often, even at night. In some cases, you may be given additional medicines to help with side effects. Follow all directions for their use. Call your doctor or health care professional for advice if you get a fever, chills or sore throat, or other symptoms of a cold or flu. Do not treat yourself. This drug decreases your body's ability to fight infections. Try to avoid being around people who are sick. This medicine may increase your risk to bruise or bleed. Call your doctor or  health care professional if you notice any unusual bleeding. Be careful brushing and flossing your teeth or using a toothpick because you may get an infection or bleed more easily. If you have any dental work done, tell your dentist you are receiving this medicine. You may get drowsy or dizzy. Do not drive, use machinery, or do anything that needs mental alertness until you know how this medicine affects you. Do not become pregnant while taking this medicine or for 1 year after stopping it. Women should inform their doctor if they wish to become pregnant or think they might be pregnant. Men should not father a child while taking this medicine and for 4 months after stopping it. There is a potential for serious side effects to an unborn child. Talk to your health care professional or pharmacist for more information. Do not breast-feed an infant while taking this medicine. This medicine may interfere with the ability to have a child. This medicine has caused ovarian failure in some women. This medicine has caused reduced sperm counts in some men. You should talk with your doctor or health care professional if you are concerned about your fertility. If you are going to have surgery, tell your doctor or health care professional that you have taken this medicine. What side effects may I notice from receiving this medicine? Side effects that you should report to your doctor or health care professional as   soon as possible: -allergic reactions like skin rash, itching or hives, swelling of the face, lips, or tongue -low blood counts - this medicine may decrease the number of white blood cells, red blood cells and platelets. You may be at increased risk for infections and bleeding. -signs of infection - fever or chills, cough, sore throat, pain or difficulty passing urine -signs of decreased platelets or bleeding - bruising, pinpoint red spots on the skin, black, tarry stools, blood in the urine -signs of  decreased red blood cells - unusually weak or tired, fainting spells, lightheadedness -breathing problems -dark urine -dizziness -palpitations -swelling of the ankles, feet, hands -trouble passing urine or change in the amount of urine -weight gain -yellowing of the eyes or skin Side effects that usually do not require medical attention (report to your doctor or health care professional if they continue or are bothersome): -changes in nail or skin color -hair loss -missed menstrual periods -mouth sores -nausea, vomiting This list may not describe all possible side effects. Call your doctor for medical advice about side effects. You may report side effects to FDA at 1-800-FDA-1088. Where should I keep my medicine? This drug is given in a hospital or clinic and will not be stored at home. NOTE: This sheet is a summary. It may not cover all possible information. If you have questions about this medicine, talk to your doctor, pharmacist, or health care provider.  2018 [employer]/Gold Standard (2012-02-12 16:22:58) Paclitaxel injection What is this medicine? PACLITAXEL (PAK li TAX el) is a chemotherapy drug. It targets fast dividing cells, like cancer cells, and causes these cells to die. This medicine is used to treat ovarian cancer, breast cancer, and other cancers. This medicine may be used for other purposes; ask your health care provider or pharmacist if you have questions. COMMON BRAND NAME(S): Onxol, Taxol What should I tell my health care provider before I take this medicine? They need to know if you have any of these conditions: -blood disorders -irregular heartbeat -infection (especially a virus infection such as chickenpox, cold sores, or herpes) -liver disease -previous or ongoing radiation therapy -an unusual or allergic reaction to paclitaxel, alcohol, polyoxyethylated castor oil, other chemotherapy agents, other medicines, foods, dyes, or preservatives -pregnant or trying to  get pregnant -breast-feeding How should I use this medicine? This drug is given as an infusion into a vein. It is administered in a hospital or clinic by a specially trained health care professional. Talk to your pediatrician regarding the use of this medicine in children. Special care may be needed. Overdosage: If you think you have taken too much of this medicine contact a poison control center or emergency room at once. NOTE: This medicine is only for you. Do not share this medicine with others. What if I miss a dose? It is important not to miss your dose. Call your doctor or health care professional if you are unable to keep an appointment. What may interact with this medicine? Do not take this medicine with any of the following medications: -disulfiram -metronidazole This medicine may also interact with the following medications: -cyclosporine -diazepam -ketoconazole -medicines to increase blood counts like filgrastim, pegfilgrastim, sargramostim -other chemotherapy drugs like cisplatin, doxorubicin, epirubicin, etoposide, teniposide, vincristine -quinidine -testosterone -vaccines -verapamil Talk to your doctor or health care professional before taking any of these medicines: -acetaminophen -aspirin -ibuprofen -ketoprofen -naproxen This list may not describe all possible interactions. Give your health care provider a list of all the medicines, herbs, non-prescription drugs, or   dietary supplements you use. Also tell them if you smoke, drink alcohol, or use illegal drugs. Some items may interact with your medicine. What should I watch for while using this medicine? Your condition will be monitored carefully while you are receiving this medicine. You will need important blood work done while you are taking this medicine. This medicine can cause serious allergic reactions. To reduce your risk you will need to take other medicine(s) before treatment with this medicine. If you  experience allergic reactions like skin rash, itching or hives, swelling of the face, lips, or tongue, tell your doctor or health care professional right away. In some cases, you may be given additional medicines to help with side effects. Follow all directions for their use. This drug may make you feel generally unwell. This is not uncommon, as chemotherapy can affect healthy cells as well as cancer cells. Report any side effects. Continue your course of treatment even though you feel ill unless your doctor tells you to stop. Call your doctor or health care professional for advice if you get a fever, chills or sore throat, or other symptoms of a cold or flu. Do not treat yourself. This drug decreases your body's ability to fight infections. Try to avoid being around people who are sick. This medicine may increase your risk to bruise or bleed. Call your doctor or health care professional if you notice any unusual bleeding. Be careful brushing and flossing your teeth or using a toothpick because you may get an infection or bleed more easily. If you have any dental work done, tell your dentist you are receiving this medicine. Avoid taking products that contain aspirin, acetaminophen, ibuprofen, naproxen, or ketoprofen unless instructed by your doctor. These medicines may hide a fever. Do not become pregnant while taking this medicine. Women should inform their doctor if they wish to become pregnant or think they might be pregnant. There is a potential for serious side effects to an unborn child. Talk to your health care professional or pharmacist for more information. Do not breast-feed an infant while taking this medicine. Men are advised not to father a child while receiving this medicine. This product may contain alcohol. Ask your pharmacist or healthcare provider if this medicine contains alcohol. Be sure to tell all healthcare providers you are taking this medicine. Certain medicines, like metronidazole  and disulfiram, can cause an unpleasant reaction when taken with alcohol. The reaction includes flushing, headache, nausea, vomiting, sweating, and increased thirst. The reaction can last from 30 minutes to several hours. What side effects may I notice from receiving this medicine? Side effects that you should report to your doctor or health care professional as soon as possible: -allergic reactions like skin rash, itching or hives, swelling of the face, lips, or tongue -low blood counts - This drug may decrease the number of white blood cells, red blood cells and platelets. You may be at increased risk for infections and bleeding. -signs of infection - fever or chills, cough, sore throat, pain or difficulty passing urine -signs of decreased platelets or bleeding - bruising, pinpoint red spots on the skin, black, tarry stools, nosebleeds -signs of decreased red blood cells - unusually weak or tired, fainting spells, lightheadedness -breathing problems -chest pain -high or low blood pressure -mouth sores -nausea and vomiting -pain, swelling, redness or irritation at the injection site -pain, tingling, numbness in the hands or feet -slow or irregular heartbeat -swelling of the ankle, feet, hands Side effects that usually do not   require medical attention (report to your doctor or health care professional if they continue or are bothersome): -bone pain -complete hair loss including hair on your head, underarms, pubic hair, eyebrows, and eyelashes -changes in the color of fingernails -diarrhea -loosening of the fingernails -loss of appetite -muscle or joint pain -red flush to skin -sweating This list may not describe all possible side effects. Call your doctor for medical advice about side effects. You may report side effects to FDA at 1-800-FDA-1088. Where should I keep my medicine? This drug is given in a hospital or clinic and will not be stored at home. NOTE: This sheet is a summary. It  may not cover all possible information. If you have questions about this medicine, talk to your doctor, pharmacist, or health care provider.  2018 [employer] Standard (2015-01-29 19:58:00)  

## 2016-09-08 NOTE — Telephone Encounter (Signed)
-----   Message from Theodore Demark, RN sent at 09/08/2016  3:39 PM EDT ----- Regarding: EMLA Cream; Clinical Trial Dr. Janese Banks, Patient phoned requesting EMLA cream be called in to [facility], [place].  She also states she does not want to participate in clinical trial at this time. Thanks! Webb Silversmith

## 2016-09-09 MED ORDER — CEFAZOLIN SODIUM-DEXTROSE 2-4 GM/100ML-% IV SOLN
2.0000 g | INTRAVENOUS | Status: AC
  Administered 2016-09-10: 2 g via INTRAVENOUS

## 2016-09-10 ENCOUNTER — Ambulatory Visit
Admission: RE | Admit: 2016-09-10 | Discharge: 2016-09-10 | Disposition: A | Discharge status: Home / Self Care | Payer: Medicare Other | Source: Ambulatory Visit | Attending: Surgery | Admitting: Surgery

## 2016-09-10 ENCOUNTER — Ambulatory Visit: Payer: Medicare Other | Admitting: Certified Registered"

## 2016-09-10 ENCOUNTER — Encounter
Admission: RE | Disposition: A | Discharge status: Home / Self Care | Payer: Self-pay | Source: Ambulatory Visit | Attending: Surgery

## 2016-09-10 ENCOUNTER — Ambulatory Visit: Payer: Medicare Other

## 2016-09-10 ENCOUNTER — Encounter: Payer: Self-pay | Admitting: *Deleted

## 2016-09-10 DIAGNOSIS — C50912 Malignant neoplasm of unspecified site of left female breast: Secondary | ICD-10-CM | POA: Diagnosis not present

## 2016-09-10 DIAGNOSIS — Z79899 Other long term (current) drug therapy: Secondary | ICD-10-CM | POA: Diagnosis not present

## 2016-09-10 DIAGNOSIS — K219 Gastro-esophageal reflux disease without esophagitis: Secondary | ICD-10-CM | POA: Diagnosis not present

## 2016-09-10 HISTORY — PX: PORTACATH PLACEMENT: SHX2246

## 2016-09-10 SURGERY — INSERTION, TUNNELED CENTRAL VENOUS DEVICE, WITH PORT
Anesthesia: General | Site: Chest | Laterality: Right | Wound class: Clean

## 2016-09-10 MED ORDER — LIDOCAINE HCL (PF) 2 % IJ SOLN
INTRAMUSCULAR | Status: AC
  Filled 2016-09-10: qty 2

## 2016-09-10 MED ORDER — FENTANYL CITRATE (PF) 100 MCG/2ML IJ SOLN
INTRAMUSCULAR | Status: DC | PRN
  Administered 2016-09-10 (×2): 50 ug via INTRAVENOUS

## 2016-09-10 MED ORDER — PROPOFOL 500 MG/50ML IV EMUL
INTRAVENOUS | Status: AC
  Filled 2016-09-10: qty 50

## 2016-09-10 MED ORDER — ACETAMINOPHEN 10 MG/ML IV SOLN
INTRAVENOUS | Status: AC
  Filled 2016-09-10: qty 100

## 2016-09-10 MED ORDER — FENTANYL CITRATE (PF) 100 MCG/2ML IJ SOLN
INTRAMUSCULAR | Status: AC
  Filled 2016-09-10: qty 2

## 2016-09-10 MED ORDER — MIDAZOLAM HCL 2 MG/2ML IJ SOLN
INTRAMUSCULAR | Status: AC
  Filled 2016-09-10: qty 2

## 2016-09-10 MED ORDER — LIDOCAINE HCL (PF) 1 % IJ SOLN
INTRAMUSCULAR | Status: AC
  Filled 2016-09-10: qty 30

## 2016-09-10 MED ORDER — CHLORHEXIDINE GLUCONATE CLOTH 2 % EX PADS: 6.0000 | MEDICATED_PAD | Freq: Once | CUTANEOUS | Status: DC

## 2016-09-10 MED ORDER — FENTANYL CITRATE (PF) 100 MCG/2ML IJ SOLN: 25.0000 ug | INTRAMUSCULAR | Status: DC | PRN

## 2016-09-10 MED ORDER — LIDOCAINE HCL (CARDIAC) 20 MG/ML IV SOLN
INTRAVENOUS | Status: DC | PRN
  Administered 2016-09-10: 50 mg via INTRAVENOUS

## 2016-09-10 MED ORDER — BUPIVACAINE-EPINEPHRINE (PF) 0.25% -1:200000 IJ SOLN
INTRAMUSCULAR | Status: DC | PRN
  Administered 2016-09-10: 12 mL

## 2016-09-10 MED ORDER — BUPIVACAINE-EPINEPHRINE (PF) 0.25% -1:200000 IJ SOLN
INTRAMUSCULAR | Status: AC
  Filled 2016-09-10: qty 30

## 2016-09-10 MED ORDER — CEFAZOLIN SODIUM-DEXTROSE 2-4 GM/100ML-% IV SOLN
INTRAVENOUS | Status: AC
  Filled 2016-09-10: qty 100

## 2016-09-10 MED ORDER — LACTATED RINGERS IV SOLN
INTRAVENOUS | Status: DC
  Administered 2016-09-10: 12:00:00 via INTRAVENOUS

## 2016-09-10 MED ORDER — MIDAZOLAM HCL 2 MG/2ML IJ SOLN
INTRAMUSCULAR | Status: DC | PRN
  Administered 2016-09-10: 1 mg via INTRAVENOUS

## 2016-09-10 MED ORDER — LABETALOL HCL 5 MG/ML IV SOLN
INTRAVENOUS | Status: DC | PRN
  Administered 2016-09-10: 5 mg via INTRAVENOUS
  Administered 2016-09-10: 15 mg via INTRAVENOUS

## 2016-09-10 MED ORDER — SODIUM CHLORIDE 0.9 % IJ SOLN
INTRAMUSCULAR | Status: AC
  Filled 2016-09-10: qty 10

## 2016-09-10 MED ORDER — FAMOTIDINE 20 MG PO TABS
ORAL_TABLET | ORAL | Status: AC
  Filled 2016-09-10: qty 1

## 2016-09-10 MED ORDER — HEPARIN SODIUM (PORCINE) 5000 UNIT/ML IJ SOLN
INTRAMUSCULAR | Status: AC
  Filled 2016-09-10: qty 1

## 2016-09-10 MED ORDER — HYDROCODONE-ACETAMINOPHEN 5-325 MG PO TABS: 1.0000 | ORAL_TABLET | Freq: Four times a day (QID) | ORAL | 0 refills | Status: DC | PRN

## 2016-09-10 MED ORDER — ONDANSETRON HCL 4 MG/2ML IJ SOLN: 4.0000 mg | Freq: Once | INTRAMUSCULAR | Status: DC | PRN

## 2016-09-10 MED ORDER — ACETAMINOPHEN 10 MG/ML IV SOLN
INTRAVENOUS | Status: DC | PRN
  Administered 2016-09-10: 1000 mg via INTRAVENOUS

## 2016-09-10 MED ORDER — PROPOFOL 500 MG/50ML IV EMUL
INTRAVENOUS | Status: DC | PRN
  Administered 2016-09-10: 100 ug/kg/min via INTRAVENOUS

## 2016-09-10 MED ORDER — FAMOTIDINE 20 MG PO TABS
20.0000 mg | ORAL_TABLET | Freq: Once | ORAL | Status: AC
  Administered 2016-09-10: 20 mg via ORAL

## 2016-09-10 MED ORDER — PROPOFOL 10 MG/ML IV BOLUS
INTRAVENOUS | Status: DC | PRN
  Administered 2016-09-10: 20 mg via INTRAVENOUS
  Administered 2016-09-10: 30 mg via INTRAVENOUS

## 2016-09-10 MED ORDER — LIDOCAINE HCL (PF) 1 % IJ SOLN
INTRAMUSCULAR | Status: DC | PRN
  Administered 2016-09-10: 12 mL

## 2016-09-10 MED ORDER — KETOROLAC TROMETHAMINE 30 MG/ML IJ SOLN
INTRAMUSCULAR | Status: DC | PRN
  Administered 2016-09-10: 15 mg via INTRAVENOUS

## 2016-09-10 SURGICAL SUPPLY — 34 items
BAG DECANTER FOR FLEXI CONT (MISCELLANEOUS) ×3 IMPLANT
BLADE SURG SZ11 CARB STEEL (BLADE) ×3 IMPLANT
BOOT SUTURE AID YELLOW STND (SUTURE) ×3 IMPLANT
CANISTER SUCT 1200ML W/VALVE (MISCELLANEOUS) ×3 IMPLANT
CHLORAPREP W/TINT 26ML (MISCELLANEOUS) ×3 IMPLANT
COVER LIGHT HANDLE STERIS (MISCELLANEOUS) ×6 IMPLANT
DERMABOND ADVANCED (GAUZE/BANDAGES/DRESSINGS) ×2
DERMABOND ADVANCED .7 DNX12 (GAUZE/BANDAGES/DRESSINGS) ×1 IMPLANT
DRAPE C-ARM XRAY 36X54 (DRAPES) ×3 IMPLANT
DRAPE INCISE IOBAN 66X45 STRL (DRAPES) ×3 IMPLANT
ELECT REM PT RETURN 9FT ADLT (ELECTROSURGICAL) ×3
ELECTRODE REM PT RTRN 9FT ADLT (ELECTROSURGICAL) ×1 IMPLANT
GEL ULTRASOUND 20GR AQUASONIC (MISCELLANEOUS) ×3 IMPLANT
GLOVE BIO SURGEON STRL SZ7 (GLOVE) ×3 IMPLANT
GOWN STRL REUS W/ TWL LRG LVL3 (GOWN DISPOSABLE) ×2 IMPLANT
GOWN STRL REUS W/TWL LRG LVL3 (GOWN DISPOSABLE) ×4
IV NS 500ML (IV SOLUTION) ×2
IV NS 500ML BAXH (IV SOLUTION) ×1 IMPLANT
KIT PORT POWER 8FR ISP CVUE (Catheter) ×3 IMPLANT
MICROPUNCTURE 5FR NT-U-SST (MISCELLANEOUS) ×3
NEEDLE HYPO 22GX1.5 SAFETY (NEEDLE) ×3 IMPLANT
NS IRRIG 1000ML POUR BTL (IV SOLUTION) ×3 IMPLANT
PACK PORT-A-CATH (MISCELLANEOUS) ×3 IMPLANT
SET MICROPUNCTURE 5FR NT-U-SST (MISCELLANEOUS) ×1 IMPLANT
SPONGE LAP 18X18 5 PK (GAUZE/BANDAGES/DRESSINGS) ×3 IMPLANT
SUT MNCRL AB 4-0 PS2 18 (SUTURE) ×3 IMPLANT
SUT PROLENE 2-0 (SUTURE) ×2
SUT PROLENE 2-0 RB1 36X2 ARM (SUTURE) ×1
SUT VIC AB 3-0 SH 27 (SUTURE) ×2
SUT VIC AB 3-0 SH 27X BRD (SUTURE) ×1 IMPLANT
SUTURE PROLEN 2-0 RB1 36X2 ARM (SUTURE) ×1 IMPLANT
SYR 20CC LL (SYRINGE) ×3 IMPLANT
SYR 5ML LL (SYRINGE) ×3 IMPLANT
TOWEL OR 17X26 4PK STRL BLUE (TOWEL DISPOSABLE) ×3 IMPLANT

## 2016-09-10 NOTE — Discharge Instructions (Signed)
AMBULATORY SURGERY  DISCHARGE INSTRUCTIONS   1) The drugs that you were given will stay in your system until tomorrow so for the next 24 hours you should not:  A) Drive an automobile B) Make any legal decisions C) Drink any alcoholic beverage   2) You may resume regular meals tomorrow.  Today it is better to start with liquids and gradually work up to solid foods.  You may eat anything you prefer, but it is better to start with liquids, then soup and crackers, and gradually work up to solid foods.   3) Please notify your doctor immediately if you have any unusual bleeding, trouble breathing, redness and pain at the surgery site, drainage, fever, or pain not relieved by medication.   Please contact your physician with any problems or Same Day Surgery at [phone], Monday through Friday 6 am to 4 pm, or [place] at [facility] number at [phone].   Implanted Port Insertion, Care After This sheet gives you information about how to care for yourself after your procedure. Your health care provider may also give you more specific instructions. If you have problems or questions, contact your health care provider. What can I expect after the procedure? After your procedure, it is common to have:  Discomfort at the port insertion site.  Bruising on the skin over the port. This should improve over 3-4 days.  Follow these instructions at home: [facility] care  After your port is placed, you will get a manufacturer's information card. The card has information about your port. Keep this card with you at all times.  Take care of the port as told by your health care provider. Ask your health care provider if you or a family member can get training for taking care of the port at home. A home health care nurse may also take care of the port.  Make sure to remember what type of port you have. Incision care  Follow instructions from your health care provider about how to take care of your  port insertion site. Make sure you: ? Wash your hands with soap and water before you change your bandage (dressing). If soap and water are not available, use hand sanitizer. ? Change your dressing as told by your health care provider. ? Leave stitches (sutures), skin glue, or adhesive strips in place. These skin closures may need to stay in place for 2 weeks or longer. If adhesive strip edges start to loosen and curl up, you may trim the loose edges. Do not remove adhesive strips completely unless your health care provider tells you to do that.  Check your port insertion site every day for signs of infection. Check for: ? More redness, swelling, or pain. ? More fluid or blood. ? Warmth. ? Pus or a bad smell. General instructions  Do not take baths, swim, or use a hot tub until your health care provider approves.  Do not lift anything that is heavier than 10 lb (4.5 kg) for a week, or as told by your health care provider.  Ask your health care provider when it is okay to: ? Return to work or school. ? Resume usual physical activities or sports.  Do not drive for 24 hours if you were given a medicine to help you relax (sedative).  Take over-the-counter and prescription medicines only as told by your health care provider.  Wear a medical alert bracelet in case of an emergency. This will tell any health care providers that you  have a port.  Keep all follow-up visits as told by your health care provider. This is important. Contact a health care provider if:  You cannot flush your port with saline as directed, or you cannot draw blood from the port.  You have a fever or chills.  You have more redness, swelling, or pain around your port insertion site.  You have more fluid or blood coming from your port insertion site.  Your port insertion site feels warm to the touch.  You have pus or a bad smell coming from the port insertion site. Get help right away if:  You have chest pain or  shortness of breath.  You have bleeding from your port that you cannot control. Summary  Take care of the port as told by your health care provider.  Change your dressing as told by your health care provider.  Keep all follow-up visits as told by your health care provider. This information is not intended to replace advice given to you by your health care provider. Make sure you discuss any questions you have with your health care provider. Document Released: 01/18/2013 Document Revised: 02/19/2016 Document Reviewed: 02/19/2016 Elsevier Interactive Patient Education  2017 [employer].

## 2016-09-10 NOTE — H&P (View-Only) (Signed)
Outpatient Surgical Follow Up  08/28/2016  Victoria Steele is an 75 y.o. female.   Chief Complaint  Patient presents with  . 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 HER-2/neu is pending, ER and PR still pending. Explained to detail about her new diagnosis and because of her clinical diagnosis of inflammatory breast cancer is is that usually neoadjuvant therapy first. Obviously the size of the cancer precludes a mastectomy  as well. Discussed in detail with Dr. Rao from oncology and she will see her next week after she gets her nomogram and ultrasound for staging poor processes and also to measure more objectively the extent of the cancer. Currently she is doing well from her recent incisional biopsy.   History reviewed. No pertinent past medical history.  History reviewed. No pertinent surgical history.  History reviewed. No pertinent family history.  Social History:  reports that she has never smoked. She has never used smokeless tobacco. She reports that she does not drink alcohol or use drugs.  Allergies: Not on File  Medications reviewed.    ROS Full ROS performed and is otherwise negative other than what is stated in HPI   BP (!) 158/94   Pulse 80   Temp 97.8 F (36.6 C) (Oral)   Ht 5' 2" (1.575 m)   Wt 96.3 kg (212 lb 3.2 oz)   BMI 38.81 kg/m   Physical Exam  Constitutional: She is oriented to person, place, and time and well-developed, well-nourished, and in no distress. No distress.  Pulmonary/Chest: Effort normal. No respiratory distress.  Abdominal: She exhibits no distension. There is no tenderness. There is no rebound and no guarding.  Musculoskeletal: Normal range of motion. She exhibits no edema.  Neurological: She is alert and oriented to  person, place, and time. Gait normal. GCS score is 15.  Skin: Skin is warm and dry.  Psychiatric: Mood, memory, affect and judgment normal.  Nursing note and vitals reviewed. Breast: left breast mass, indurated biopsy site intact. 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 Clinically no evidence of metastatic disease. Pending mammogram and ultrasound . I will order a CBC, CMP and a chest x-ray as part of the staging process. I will let Dr. Rao from oncology to further staging imaging studies as clinically indicated. Since we already have a diagnosis of cancer I have also discussed with her in detail about the need for a port placement. Discussed with the patient in detail about the risks benefits and possible complications including but not limited to: Bleeding, infection, pneumothorax, vascular injury. She understands and wishes to proceed. We'll schedule her for port in about 10 days to expedite her chemotherapy treatment. We will obviously await further markers from the pathology report and the mammogram. Extensive counseling provided and I spent greater than 40 minutes in this encounter with greater than 50% of the time spent in counseling and coordination of care.  Diego Pabon, MD FACS General Surgeon 

## 2016-09-10 NOTE — Anesthesia Procedure Notes (Signed)
Performed by: Larry Knipp Pre-anesthesia Checklist: Patient identified, Emergency Drugs available, Suction available, Patient being monitored and Timeout performed Patient Re-evaluated:Patient Re-evaluated prior to inductionOxygen Delivery Method: Nasal cannula Preoxygenation: Pre-oxygenation with 100% oxygen Intubation Type: IV induction       

## 2016-09-10 NOTE — Anesthesia Preprocedure Evaluation (Addendum)
Anesthesia Evaluation  Patient identified by MRN, date of birth, ID band Patient awake    Reviewed: Allergy & Precautions, NPO status , Patient's Chart, lab work & pertinent test results  Airway Mallampati: III  TM Distance: <3 FB     Dental   Pulmonary neg pulmonary ROS,    Pulmonary exam normal        Cardiovascular negative cardio ROS Normal cardiovascular exam     Neuro/Psych negative neurological ROS  negative psych ROS   GI/Hepatic Neg liver ROS, GERD  Medicated,  Endo/Other  negative endocrine ROS  Renal/GU negative Renal ROS     Musculoskeletal   Abdominal Normal abdominal exam  (+)   Peds  Hematology negative hematology ROS (+)   Anesthesia Other Findings Past Medical History: No date: 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

## 2016-09-10 NOTE — Op Note (Signed)
  Pre-operative Diagnosis:  Post-operative Diagnosis:    Surgeon: Caroleen Hamman, MD FACS  Anesthesia: IV sedation, marcaine .25% w epi and lidocaine 1%  Procedure: right IJ  Port placement with fluoroscopy under U/S guidance  Findings: Good position of the tip of the catheter by fluoroscopy  Estimated Blood Loss: Minimal         Drains: None         Specimens: None       Complications: none            Procedure Details  The patient was seen again in the Holding Room. The benefits, complications, treatment options, and expected outcomes were discussed with the patient. The risks of bleeding, infection, recurrence of symptoms, failure to resolve symptoms,  thrombosis nonfunction breakage pneumothorax hemopneumothorax any of which could require chest tube or further surgery were reviewed with the patient.   The patient was taken to Operating Room, identified as Victoria Steele and the procedure verified.  A Time Out was held and the above information confirmed.  Prior to the induction of general anesthesia, antibiotic prophylaxis was administered. VTE prophylaxis was in place. Appropriate anesthesia was then administered and tolerated well. The chest was prepped with Chloraprep and draped in the sterile fashion. The patient was positioned in the supine position. Then the patient was placed in Trendelenburg position.  Patient was prepped and draped in sterile fashion and in a Trendelenburg position local anesthetic was infiltrated into the skin and subcutaneous tissues in the neck and anterior chest wall. The large bore needle was placed into the internal jugular vein under U/S guidance without difficulty, initially the standard wire would not pass and we used a micro puncture wire that was able to be manipulated w/o issues.. Fluoroscopy was utilized to confirm that the  wire was in the superior vena cava.  An incision was made and a port pocket developed with blunt and electrocautery  dissection. The introducer dilator was placed over the Seldinger wire the wire was removed. The previously flushed catheter was placed into the introducer dilator and the peel-away sheath was removed. The catheter length was confirmed and trimmed utilizing fluoroscopy for proper positioning. The catheter was then attached to the previously flushed port. The port was placed into the pocket. The port was held in with 2-0 Prolenes and flushed for function and heparin locked.  The wound was closed with interrupted 3-0 Vicryl followed by 4-0 subcuticular Monocryl sutures. Dermabond used to coat the skin  Patient was taken to the recovery room in stable condition where a postoperative chest film has been ordered.

## 2016-09-10 NOTE — Interval H&P Note (Signed)
History and Physical Interval Note:  09/10/2016 10:41 AM  Victoria Steele  has presented today for surgery, with the diagnosis of breast mass  The various methods of treatment have been discussed with the patient and family. After consideration of risks, benefits and other options for treatment, the patient has consented to  Procedure(s): INSERTION PORT-A-CATH (N/A) as a surgical intervention .  The patient's history has been reviewed, patient examined, no change in status, stable for surgery.  I have reviewed the patient's chart and labs.  Questions were answered to the patient's satisfaction.     [place]

## 2016-09-10 NOTE — Transfer of Care (Signed)
Immediate Anesthesia Transfer of Care Note  Patient: Victoria Steele  Procedure(s) Performed: Procedure(s): INSERTION PORT-A-CATH (Right)  Patient Location: PACU  Anesthesia Type:General  Level of Consciousness: sedated and responds to stimulation  Airway & Oxygen Therapy: Patient Spontanous Breathing and Patient connected to nasal cannula oxygen  Post-op Assessment: Report given to RN and Post -op Vital signs reviewed and stable  Post vital signs: Reviewed and stable  Last Vitals:  Vitals:   09/10/16 1107 09/10/16 1334  BP: (!) 212/62 (!) 144/66  Pulse: 66 67  Resp: 16 14  Temp: 37.2 C     Last Pain:  Vitals:   09/10/16 1107  TempSrc: Oral         Complications: No apparent anesthesia complications

## 2016-09-10 NOTE — Anesthesia Post-op Follow-up Note (Cosign Needed)
Anesthesia QCDR form completed.        

## 2016-09-10 NOTE — Anesthesia Postprocedure Evaluation (Signed)
Anesthesia Post Note  Patient: Victoria Steele  Procedure(s) Performed: Procedure(s) (LRB): INSERTION PORT-A-CATH (Right)  Patient location during evaluation: PACU Anesthesia Type: General Level of consciousness: awake and alert and oriented Pain management: pain level controlled Vital Signs Assessment: post-procedure vital signs reviewed and stable Respiratory status: spontaneous breathing Cardiovascular status: blood pressure returned to baseline Anesthetic complications: no     Last Vitals:  Vitals:   09/10/16 1427 09/10/16 1451  BP: (!) 212/62 (!) 200/67  Pulse: 66 (!) 56  Resp: 18 16  Temp: 36.4 C     Last Pain:  Vitals:   09/10/16 1451  TempSrc:   PainSc: 0-No pain                 Madyx Delfin

## 2016-09-11 ENCOUNTER — Ambulatory Visit
Admission: RE | Admit: 2016-09-11 | Discharge: 2016-09-11 | Disposition: A | Discharge status: Home / Self Care | Payer: Medicare Other | Source: Ambulatory Visit | Attending: Oncology | Admitting: Oncology

## 2016-09-11 DIAGNOSIS — C7951 Secondary malignant neoplasm of bone: Secondary | ICD-10-CM | POA: Diagnosis not present

## 2016-09-11 DIAGNOSIS — K5730 Diverticulosis of large intestine without perforation or abscess without bleeding: Secondary | ICD-10-CM | POA: Insufficient documentation

## 2016-09-11 DIAGNOSIS — C50912 Malignant neoplasm of unspecified site of left female breast: Secondary | ICD-10-CM | POA: Diagnosis present

## 2016-09-11 DIAGNOSIS — I700 Atherosclerosis of aorta: Secondary | ICD-10-CM | POA: Insufficient documentation

## 2016-09-11 HISTORY — PX: CYST EXCISION: SHX5701

## 2016-09-11 LAB — GLUCOSE, CAPILLARY: Glucose-Capillary: 122 mg/dL — ABNORMAL HIGH (ref 65–99)

## 2016-09-11 MED ORDER — FLUDEOXYGLUCOSE F - 18 (FDG) INJECTION
12.5000 | Freq: Once | INTRAVENOUS | Status: AC | PRN
  Administered 2016-09-11: 12.5 via INTRAVENOUS

## 2016-09-14 ENCOUNTER — Encounter
Admission: RE | Admit: 2016-09-14 | Discharge: 2016-09-14 | Disposition: A | Discharge status: Home / Self Care | Payer: Medicare Other | Source: Ambulatory Visit | Attending: Oncology | Admitting: Oncology

## 2016-09-14 DIAGNOSIS — Z95828 Presence of other vascular implants and grafts: Secondary | ICD-10-CM

## 2016-09-14 DIAGNOSIS — C50912 Malignant neoplasm of unspecified site of left female breast: Secondary | ICD-10-CM | POA: Insufficient documentation

## 2016-09-14 HISTORY — DX: Presence of other vascular implants and grafts: Z95.828

## 2016-09-14 MED ORDER — TECHNETIUM TC 99M-LABELED RED BLOOD CELLS IV KIT: 20.0000 | PACK | Freq: Once | INTRAVENOUS | Status: DC | PRN

## 2016-09-15 ENCOUNTER — Other Ambulatory Visit: Payer: Self-pay | Admitting: *Deleted

## 2016-09-16 ENCOUNTER — Telehealth: Payer: Self-pay

## 2016-09-16 NOTE — Telephone Encounter (Signed)
Patient had port a cath placed on 09/10/16. She has an upcoming appointment with [place] tomorrow but then has her chemo therapy afterwards. She was given a creme to put on her surgical site 2 hours prior to her chemo and she would like to know if it's okay to do that before her appointment with [place] or if she should hold of on it. Please call patient and advice.

## 2016-09-16 NOTE — Telephone Encounter (Signed)
Returned phone call to patient. I explained that it is suggested that she apply EMLA cream as she normally does prior to chemo and that this should not interfere with our exam. She verbalizes understanding.

## 2016-09-17 ENCOUNTER — Encounter: Payer: Self-pay | Admitting: Surgery

## 2016-09-17 ENCOUNTER — Encounter: Payer: Self-pay | Admitting: Oncology

## 2016-09-17 ENCOUNTER — Inpatient Hospital Stay: Payer: Medicare Other

## 2016-09-17 ENCOUNTER — Other Ambulatory Visit: Payer: Self-pay | Admitting: *Deleted

## 2016-09-17 ENCOUNTER — Inpatient Hospital Stay: Payer: Medicare Other | Attending: Oncology | Admitting: Oncology

## 2016-09-17 ENCOUNTER — Ambulatory Visit (INDEPENDENT_AMBULATORY_CARE_PROVIDER_SITE_OTHER): Payer: Medicare Other | Admitting: Surgery

## 2016-09-17 VITALS — BP 195/92 | HR 84 | Temp 99.1°F | Resp 18 | Wt 210.6 lb

## 2016-09-17 VITALS — BP 212/85 | HR 84 | Temp 98.6°F | Ht 62.0 in | Wt 211.0 lb

## 2016-09-17 DIAGNOSIS — Z90710 Acquired absence of both cervix and uterus: Secondary | ICD-10-CM | POA: Diagnosis not present

## 2016-09-17 DIAGNOSIS — Z79899 Other long term (current) drug therapy: Secondary | ICD-10-CM | POA: Diagnosis not present

## 2016-09-17 DIAGNOSIS — C50912 Malignant neoplasm of unspecified site of left female breast: Secondary | ICD-10-CM

## 2016-09-17 DIAGNOSIS — I517 Cardiomegaly: Secondary | ICD-10-CM | POA: Diagnosis not present

## 2016-09-17 DIAGNOSIS — Z5111 Encounter for antineoplastic chemotherapy: Secondary | ICD-10-CM | POA: Diagnosis present

## 2016-09-17 DIAGNOSIS — C7951 Secondary malignant neoplasm of bone: Secondary | ICD-10-CM

## 2016-09-17 DIAGNOSIS — C50112 Malignant neoplasm of central portion of left female breast: Secondary | ICD-10-CM | POA: Diagnosis not present

## 2016-09-17 DIAGNOSIS — K760 Fatty (change of) liver, not elsewhere classified: Secondary | ICD-10-CM | POA: Diagnosis not present

## 2016-09-17 DIAGNOSIS — Z170 Estrogen receptor positive status [ER+]: Secondary | ICD-10-CM | POA: Insufficient documentation

## 2016-09-17 DIAGNOSIS — K5730 Diverticulosis of large intestine without perforation or abscess without bleeding: Secondary | ICD-10-CM | POA: Diagnosis not present

## 2016-09-17 DIAGNOSIS — Z7982 Long term (current) use of aspirin: Secondary | ICD-10-CM | POA: Diagnosis not present

## 2016-09-17 DIAGNOSIS — Z09 Encounter for follow-up examination after completed treatment for conditions other than malignant neoplasm: Secondary | ICD-10-CM

## 2016-09-17 DIAGNOSIS — C801 Malignant (primary) neoplasm, unspecified: Secondary | ICD-10-CM

## 2016-09-17 LAB — COMPREHENSIVE METABOLIC PANEL
ALT: 31 U/L (ref 14–54)
AST: 33 U/L (ref 15–41)
Albumin: 4.3 g/dL (ref 3.5–5.0)
Alkaline Phosphatase: 253 U/L — ABNORMAL HIGH (ref 38–126)
Anion gap: 8 (ref 5–15)
BILIRUBIN TOTAL: 0.9 mg/dL (ref 0.3–1.2)
BUN: 12 mg/dL (ref 6–20)
CALCIUM: 9.2 mg/dL (ref 8.9–10.3)
CO2: 24 mmol/L (ref 22–32)
CREATININE: 0.67 mg/dL (ref 0.44–1.00)
Chloride: 105 mmol/L (ref 101–111)
Glucose, Bld: 120 mg/dL — ABNORMAL HIGH (ref 65–99)
Potassium: 4.1 mmol/L (ref 3.5–5.1)
SODIUM: 137 mmol/L (ref 135–145)
TOTAL PROTEIN: 7.4 g/dL (ref 6.5–8.1)

## 2016-09-17 LAB — CBC WITH DIFFERENTIAL/PLATELET
Basophils Absolute: 0 10*3/uL (ref 0–0.1)
Basophils Relative: 1 %
Eosinophils Absolute: 0.1 10*3/uL (ref 0–0.7)
Eosinophils Relative: 2 %
HEMATOCRIT: 38.9 % (ref 35.0–47.0)
HEMOGLOBIN: 13.6 g/dL (ref 12.0–16.0)
LYMPHS ABS: 1.8 10*3/uL (ref 1.0–3.6)
LYMPHS PCT: 33 %
MCH: 30.7 pg (ref 26.0–34.0)
MCHC: 35 g/dL (ref 32.0–36.0)
MCV: 87.8 fL (ref 80.0–100.0)
MONOS PCT: 5 %
Monocytes Absolute: 0.3 10*3/uL (ref 0.2–0.9)
Neutro Abs: 3.1 10*3/uL (ref 1.4–6.5)
Neutrophils Relative %: 59 %
Platelets: 257 10*3/uL (ref 150–440)
RBC: 4.43 MIL/uL (ref 3.80–5.20)
RDW: 15.1 % — ABNORMAL HIGH (ref 11.5–14.5)
WBC: 5.3 10*3/uL (ref 3.6–11.0)

## 2016-09-17 MED ORDER — SODIUM CHLORIDE 0.9 % IV SOLN
Freq: Once | INTRAVENOUS | Status: AC
  Administered 2016-09-17: 13:00:00 via INTRAVENOUS
  Filled 2016-09-17: qty 5

## 2016-09-17 MED ORDER — DOXORUBICIN HCL CHEMO IV INJECTION 2 MG/ML
60.0000 mg/m2 | Freq: Once | INTRAVENOUS | Status: AC
  Administered 2016-09-17: 124 mg via INTRAVENOUS
  Filled 2016-09-17: qty 62

## 2016-09-17 MED ORDER — PALONOSETRON HCL INJECTION 0.25 MG/5ML
0.2500 mg | Freq: Once | INTRAVENOUS | Status: AC
  Administered 2016-09-17: 0.25 mg via INTRAVENOUS
  Filled 2016-09-17: qty 5

## 2016-09-17 MED ORDER — SODIUM CHLORIDE 0.9 % IV SOLN
Freq: Once | INTRAVENOUS | Status: AC
  Administered 2016-09-17: 12:00:00 via INTRAVENOUS
  Filled 2016-09-17: qty 1000

## 2016-09-17 MED ORDER — HEPARIN SOD (PORK) LOCK FLUSH 100 UNIT/ML IV SOLN
500.0000 [IU] | Freq: Once | INTRAVENOUS | Status: AC | PRN
  Administered 2016-09-17: 500 [IU]
  Filled 2016-09-17: qty 5

## 2016-09-17 MED ORDER — PEGFILGRASTIM 6 MG/0.6ML ~~LOC~~ PSKT
6.0000 mg | PREFILLED_SYRINGE | Freq: Once | SUBCUTANEOUS | Status: AC
  Administered 2016-09-17: 6 mg via SUBCUTANEOUS
  Filled 2016-09-17: qty 0.6

## 2016-09-17 MED ORDER — SODIUM CHLORIDE 0.9 % IV SOLN
600.0000 mg/m2 | Freq: Once | INTRAVENOUS | Status: AC
  Administered 2016-09-17: 1240 mg via INTRAVENOUS
  Filled 2016-09-17: qty 12

## 2016-09-17 MED ORDER — SODIUM CHLORIDE 0.9% FLUSH
10.0000 mL | INTRAVENOUS | Status: DC | PRN
  Administered 2016-09-17: 10 mL
  Filled 2016-09-17: qty 10

## 2016-09-17 NOTE — Patient Instructions (Signed)
Please call with any questions or concerns. We will call as soon as it is time for your next appointment.

## 2016-09-17 NOTE — Progress Notes (Signed)
Patient here today for her first chemotherapy treatment.  States she is a little anxious.   Offers no complaints. BP elevated today.  195/92 HR 84.  Patient was at Dr. Corlis Leak office earlier today and she states her BP is lower than what it was at his office.

## 2016-09-17 NOTE — Addendum Note (Signed)
Addended by: Randa Evens C on: 09/17/2016 12:16 PM   Modules accepted: Orders

## 2016-09-17 NOTE — Progress Notes (Signed)
S/p port doing well  PE NAD Incision c/d/i  A/p doing well  no complications Ok to use port

## 2016-09-17 NOTE — Addendum Note (Signed)
Addended by: Novella Olive on: 09/17/2016 12:29 PM   Modules accepted: Orders

## 2016-09-17 NOTE — Progress Notes (Signed)
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 Patient is a 76 year old female with no significant comorbidities who noticed left breast mass about 6-9 months ago. She thought it would go away but it continued to increase in size and began to involve her skin. She also noted tenderness to palpation as well as intermittent sharp tingling pain. She was seen by Dr. Adora Fridge on 08/25/2016 and patient had a biopsy of her skin in his office which showed:DIAGNOSIS:  A. LEFT BREAST SKIN; EXCISION:  - INVASIVE CARCINOMA MORPHOLOGICALLY CONSISTENT WITH MAMMARY ORIGIN  INVOLVING THE DERMIS.   BREAST BIOMARKER TESTS  Estrogen Receptor (ER) Status: POSITIVE, >90%  Progesterone Receptor (PgR) Status: POSITIVE, >70%  Her2 negative  2. Patient underwent bilateral diagnostic mammogram on 08/31/2016 showed:IMPRESSION: 1. Known left breast cancer, retroareolar with probable extension to the nipple, measuring 4.3 cm greatest dimension by ultrasound, with associated architectural distortion and associated left nipple retraction, and with associated diffuse skin thickening on the left. 2. No enlarged or morphologically abnormal lymph nodes are seen in the left axilla by ultrasound. 3. No evidence of malignancy within the right breast.  3. Patient was also complaining of some perirectal pain and underwent CT abdomen pelvis with contrast on 09/02/2016 which showed: IMPRESSION: Left colonic diverticulosis. No active diverticulitis.Fatty liver. No acute findings or evidence of metastatic  disease in the abdomen or pelvis. Diffuse bony sclerotic metastases.  4. Other than that her breast pain patient overall feels well. Denies any unintentional weight loss. She continues to be active and care for her great-grandchildren. She has had a hysterectomy in the past. No family history of breast or ovarian cancer.  5. Given that she had inflammatory breast cancer- plan was to give 4 cycles of dose dense AC followed by possible mastectomy and treat bone mets with AI + ibrance  6. PET CT scan on 09/11/16 showed: IMPRESSION: 1. Low-grade but abnormal hypermetabolic activity associated with the cutaneous and sub areolar glandular tissues of the left breast, compatible with malignancy, representative SUV 3.9 compared to contralateral normal side 1.4. 2. Diffuse sclerotic osseous metastatic disease with low-grade hypermetabolic activity, a representative SUV along the left sacrum 5.4. 3. Other imaging findings of potential clinical significance: Aortic Atherosclerosis (ICD10-I70.0). Descending and sigmoid colon Diverticulosis.  7. Baseline MUGA scan showed EF of 71%. Baseline CA 27.29 elevated at 563.2   Interval history- doing well. Denies any complaints today  ECOG PS- 0 Pain scale- 0 Opioid associated constipation- no  Review of systems- Review of Systems  Constitutional: Negative for chills, fever, malaise/fatigue and weight loss.  HENT: Negative for congestion, ear discharge and nosebleeds.   Eyes: Negative for blurred vision.  Respiratory: Negative for cough, hemoptysis, sputum production, shortness of breath and wheezing.   Cardiovascular: Negative for chest pain, palpitations, orthopnea and claudication.  Gastrointestinal: Negative for abdominal pain, blood in stool, constipation, diarrhea, heartburn, melena, nausea and vomiting.  Genitourinary: Negative for dysuria, flank pain, frequency, hematuria and urgency.  Musculoskeletal: Negative for back pain, joint pain and  myalgias.  Skin: Negative for rash.  Neurological: Negative for dizziness,  tingling, focal weakness, seizures, weakness and headaches.  Endo/Heme/Allergies: Does not bruise/bleed easily.  Psychiatric/Behavioral: Negative for depression and suicidal ideas. The patient does not have insomnia.       No Known Allergies   Past Medical History:  Diagnosis Date  . Cancer ([place])   . Port catheter in place 09/14/2016   Placed 09/10/2016 RT chest.     Past Surgical History:  Procedure Laterality Date  . ABDOMINAL HYSTERECTOMY    . PORTACATH PLACEMENT Right 09/10/2016   Procedure: INSERTION PORT-A-CATH;  Surgeon: Jules Husbands, MD;  Location: [facility] ORS;  Service: General;  Laterality: Right;    Social History   Social History  . Marital status: Married    Spouse name: N/A  . Number of children: N/A  . Years of education: N/A   Occupational History  . Not on file.   Social History Main Topics  . Smoking status: Never Smoker  . Smokeless tobacco: Never Used  . Alcohol use No  . Drug use: No  . Sexual activity: No   Other Topics Concern  . Not on file   Social History Narrative  . No narrative on file    No family h/o breast cancer   Current Outpatient Prescriptions:  .  Aspirin-Acetaminophen-Caffeine (EXCEDRIN EXTRA STRENGTH PO), Take 1 tablet by mouth daily as needed (pain)., Disp: , Rfl:  .  dexamethasone (DECADRON) 4 MG tablet, Take 2 tablets by mouth once a day on the day after chemotherapy and then take 2 tablets two times a day for 2 days. Take with food. (Patient not taking: Reported on 09/09/2016), Disp: 30 tablet, Rfl: 1 .  HYDROcodone-acetaminophen (NORCO/VICODIN) 5-325 MG tablet, Take 1-2 tablets by mouth every 6 (six) hours as needed for moderate pain., Disp: 30 tablet, Rfl: 0 .  lidocaine-prilocaine (EMLA) cream, Apply to affected area once (Patient not taking: Reported on 09/09/2016), Disp: 30 g, Rfl: 3 .  LORazepam (ATIVAN) 0.5 MG tablet, Take 1 tablet (0.5 mg  total) by mouth every 6 (six) hours as needed (Nausea or vomiting). (Patient not taking: Reported on 09/09/2016), Disp: 30 tablet, Rfl: 0 .  ondansetron (ZOFRAN) 8 MG tablet, Take 1 tablet (8 mg total) by mouth 2 (two) times daily as needed. Start on the third day after chemotherapy. (Patient not taking: Reported on 09/09/2016), Disp: 30 tablet, Rfl: 1 .  prochlorperazine (COMPAZINE) 10 MG tablet, Take 1 tablet (10 mg total) by mouth every 6 (six) hours as needed (Nausea or vomiting). (Patient not taking: Reported on 09/09/2016), Disp: 30 tablet, Rfl: 1  Physical exam:  Vitals:   09/17/16 1121  BP: (!) 195/92  Pulse: 84  Resp: 18  Temp: 99.1 F (37.3 C)  TempSrc: Tympanic  Weight: 210 lb 9 oz (95.5 kg)   Physical Exam  Constitutional: She is oriented to person, place, and time and well-developed, well-nourished, and in no distress.  HENT:  Head: Normocephalic and atraumatic.  Eyes: EOM are normal. Pupils are equal, round, and reactive to light.  Neck: Normal range of motion.  Cardiovascular: Normal rate, regular rhythm and normal heart sounds.   Pulmonary/Chest: Effort normal and breath sounds normal.  Abdominal: Soft. Bowel sounds are normal.  Neurological: She is alert and oriented to person, place, and time.  Skin: Skin is warm and dry.   large left breast mass noted involving the skin. No overt ulceration or open wounds.   CMP Latest Ref Rng & Units 09/17/2016  Glucose 65 - 99 mg/dL 120(H)  BUN  6 - 20 mg/dL 12  Creatinine 1.55 - 2.08 mg/dL 0.22  Sodium 336 - 122 mmol/L 137  Potassium 3.5 - 5.1 mmol/L 4.1  Chloride 101 - 111 mmol/L 105  CO2 22 - 32 mmol/L 24  Calcium 8.9 - 10.3 mg/dL 9.2  Total Protein 6.5 - 8.1 g/dL 7.4  Total Bilirubin 0.3 - 1.2 mg/dL 0.9  Alkaline Phos 38 - 126 U/L 253(H)  AST 15 - 41 U/L 33  ALT 14 - 54 U/L 31   CBC Latest Ref Rng & Units 09/17/2016  WBC 3.6 - 11.0 K/uL 5.3  Hemoglobin 12.0 - 16.0 g/dL 44.9  Hematocrit 75.3 - 47.0 % 38.9  Platelets 150  - 440 K/uL 257    No images are attached to the encounter.  Dg Chest 2 View  Result Date: 08/28/2016 CLINICAL DATA:  Preop for breast lump removal EXAM: CHEST  2 VIEW COMPARISON:  None. FINDINGS: Normal cardiac silhouette. There is chronic bronchitic markings in the lungs. No focal infiltrate or pneumothorax. No pulmonary edema. Sclerotic changes in the LEFT shoulder and scapula. IMPRESSION: 1. No acute cardiopulmonary process. 2. Sclerotic change in the LEFT scapula and proximal humerus. Recommend CT to evaluate for sclerotic skeletal metastasis. These results will be called to the ordering clinician or representative by the Radiologist Assistant, and communication documented in the PACS or zVision Dashboard. Electronically Signed   By: Genevive Bi M.D.   On: 08/28/2016 09:56   Nm Cardiac Muga Rest  Result Date: 09/14/2016 CLINICAL DATA:  LEFT breast cancer, pre cardiotoxic chemotherapy EXAM: NUCLEAR MEDICINE CARDIAC BLOOD POOL IMAGING (MUGA) TECHNIQUE: Cardiac multi-gated acquisition was performed at rest following intravenous injection of Tc-58m labeled red blood cells. RADIOPHARMACEUTICALS:  21.445 mCi Tc-41m pertechnetate in-vitro labeled autologous red blood cells IV COMPARISON:  None FINDINGS: Calculated LEFT ventricular ejection fraction is 71% which is within the normal range. Study was obtained at at a cardiac rate of 78 beats per minute. Patient was rhythmic during imaging. Cine analysis of the LEFT ventricle in 3 projections demonstrates normal LEFT ventricular wall motion. IMPRESSION: Normal LEFT ventricular ejection fraction of 71%. Normal LV wall motion. Electronically Signed   By: Ulyses Southward M.D.   On: 09/14/2016 15:42   Ct Abdomen Pelvis W Contrast  Result Date: 09/02/2016 CLINICAL DATA:  Right perirectal swelling. Newly diagnosed left breast cancer EXAM: CT ABDOMEN AND PELVIS WITH CONTRAST TECHNIQUE: Multidetector CT imaging of the abdomen and pelvis was performed using the  standard protocol following bolus administration of intravenous contrast. CONTRAST:  ISOVUE-300 IOPAMIDOL (ISOVUE-300) INJECTION 61% COMPARISON:  None. FINDINGS: Lower chest: Linear scarring in the left base. No effusions. Small nodule at the right base on image 9 measures 3 mm. Heart is normal size. Hepatobiliary: Fatty infiltration of the liver. No focal abnormality or biliary ductal dilatation. Gallbladder unremarkable. Pancreas: No focal abnormality or ductal dilatation. Spleen: No focal abnormality.  Normal size. Adrenals/Urinary Tract: No adrenal abnormality. No focal renal abnormality. No stones or hydronephrosis. Urinary bladder is unremarkable. Stomach/Bowel: Descending colonic and sigmoid diverticulosis. No active diverticulitis. Stomach and small bowel unremarkable. Vascular/Lymphatic: No evidence of aneurysm or adenopathy. Aortic calcifications. Reproductive: Prior hysterectomy.  No adnexal masses. Other: No free fluid or free air. No visible soft tissue abnormality in the perirectal region. Musculoskeletal: Diffuse sclerosis throughout the entire visualized spine and bony pelvis is well is lower ribs compatible with diffuse sclerotic metastases. IMPRESSION: Left colonic diverticulosis.  No active diverticulitis. Fatty liver. No acute findings or evidence of metastatic  disease in the abdomen or pelvis. Diffuse bony sclerotic metastases. Electronically Signed   By: Rolm Baptise M.D.   On: 09/02/2016 09:53   Nm Pet Image Initial (pi) Skull Base To Thigh  Result Date: 09/11/2016 CLINICAL DATA:  Initial treatment strategy for metastatic left breast cancer. EXAM: NUCLEAR MEDICINE PET SKULL BASE TO THIGH TECHNIQUE: 12.5 mCi F-18 FDG was injected intravenously. Full-ring PET imaging was performed from the skull base to thigh after the radiotracer. CT data was obtained and used for attenuation correction and anatomic localization. FASTING BLOOD GLUCOSE:  Value: 122 mg/dl COMPARISON:  Multiple exams,  including CT scan from 09/02/2016 FINDINGS: NECK No hypermetabolic lymph nodes in the neck. CHEST Right Port-A-Cath tip:  SVC. Retracted and possibly inverted left nipple with associated skin thickening and faint scan hypermetabolic activity as well as retroareolar indistinctly marginated accentuated tissues with maximum SUV 3.9. In contrast a similar region on the right has a maximum SUV of 1.4. There is stranding around a faintly metabolic left axillary node measuring about 7 mm in short axis diameter, maximum SUV of this node shown on image 86/4 is 1.4. Mild cardiomegaly. ABDOMEN/PELVIS No abnormal hypermetabolic activity within the liver, pancreas, adrenal glands, or spleen. No hypermetabolic lymph nodes in the abdomen or pelvis. Aortoiliac atherosclerotic vascular disease. Descending and sigmoid colon diverticulosis. SKELETON Diffuse osseous sclerotic metastatic disease especially involving the axial skeleton but with some involvement of the appendicular skeleton. Low-grade associated hypermetabolic activity. A representative portion of the left upper sacrum has a maximum SUV of 5.4. Overall the abnormal bony uptake is low-grade in generalized in the regions of diffuse osseous metastatic disease, rather than focal. IMPRESSION: 1. Low-grade but abnormal hypermetabolic activity associated with the cutaneous and sub areolar glandular tissues of the left breast, compatible with malignancy, representative SUV 3.9 compared to contralateral normal side 1.4. 2. Diffuse sclerotic osseous metastatic disease with low-grade hypermetabolic activity, a representative SUV along the left sacrum 5.4. 3. Other imaging findings of potential clinical significance: Aortic Atherosclerosis (ICD10-I70.0). Descending and sigmoid colon diverticulosis. Electronically Signed   By: Van Clines M.D.   On: 09/11/2016 11:48   Dg Chest Port 1 View  Result Date: 09/10/2016 CLINICAL DATA:  Status post port insertion. EXAM: PORTABLE  CHEST 1 VIEW COMPARISON:  Fluoroscopy from earlier today FINDINGS: Right IJ porta catheter with tip at the SVC level. The venotomy is somewhat high in the neck such that the upper catheter was not covered. No visualized kink. No pneumothorax. Cardiomegaly. Mild atelectasis. IMPRESSION: 1. New porta catheter with tip at the SVC level. 2. No evidence of pneumothorax. 3. Small portion of the catheter is not seen in the neck. Electronically Signed   By: Monte Fantasia M.D.   On: 09/10/2016 13:55   Dg C-arm 1-60 Min-no Report  Result Date: 09/10/2016 Fluoroscopy was utilized by the requesting physician.  No radiographic interpretation.   US Breast Ltd Uni Left Inc Axilla  Result Date: 08/31/2016 CLINICAL DATA:  Recently diagnosed inflammatory breast cancer via skin punch biopsy. Baseline mammogram. EXAM: 2D DIGITAL DIAGNOSTIC BILATERAL MAMMOGRAM WITH CAD AND ADJUNCT TOMO ULTRASOUND LEFT BREAST COMPARISON:  None. ACR Breast Density Category b: There are scattered areas of fibroglandular density. FINDINGS: There is a spiculated mass within the retroareolar left breast, measuring 4-5 cm greatest dimension, with associated architectural distortion, probable extension to the nipple, with associated left nipple retraction and diffuse skin thickening on the left. There are no dominant masses, suspicious calcifications or secondary signs of malignancy within the  right breast. Mammographic images were processed with CAD. Targeted ultrasound is performed, showing an ill-defined hypoechoic area in the subareolar left breast, measuring up to 4.3 cm greatest dimension, corresponding to the mammographic finding and known invasive carcinoma. Left axilla was also evaluated with ultrasound showing no enlarged or morphologically abnormal lymph nodes. IMPRESSION: 1. Known left breast cancer, retroareolar with probable extension to the nipple, measuring 4.3 cm greatest dimension by ultrasound, with associated architectural  distortion and associated left nipple retraction, and with associated diffuse skin thickening on the left. 2. No enlarged or morphologically abnormal lymph nodes are seen in the left axilla by ultrasound. 3. No evidence of malignancy within the right breast. RECOMMENDATION: Per current treatment plan for patient's known left breast cancer. I have discussed the findings and recommendations with the patient. Results were also provided in writing at the conclusion of the visit. If applicable, a reminder letter will be sent to the patient regarding the next appointment. BI-RADS CATEGORY  6: Known biopsy-proven malignancy. Electronically Signed   By: Franki Cabot M.D.   On: 08/31/2016 15:46   Mm Diag Breast Tomo Bilateral  Result Date: 08/31/2016 CLINICAL DATA:  Recently diagnosed inflammatory breast cancer via skin punch biopsy. Baseline mammogram. EXAM: 2D DIGITAL DIAGNOSTIC BILATERAL MAMMOGRAM WITH CAD AND ADJUNCT TOMO ULTRASOUND LEFT BREAST COMPARISON:  None. ACR Breast Density Category b: There are scattered areas of fibroglandular density. FINDINGS: There is a spiculated mass within the retroareolar left breast, measuring 4-5 cm greatest dimension, with associated architectural distortion, probable extension to the nipple, with associated left nipple retraction and diffuse skin thickening on the left. There are no dominant masses, suspicious calcifications or secondary signs of malignancy within the right breast. Mammographic images were processed with CAD. Targeted ultrasound is performed, showing an ill-defined hypoechoic area in the subareolar left breast, measuring up to 4.3 cm greatest dimension, corresponding to the mammographic finding and known invasive carcinoma. Left axilla was also evaluated with ultrasound showing no enlarged or morphologically abnormal lymph nodes. IMPRESSION: 1. Known left breast cancer, retroareolar with probable extension to the nipple, measuring 4.3 cm greatest dimension by  ultrasound, with associated architectural distortion and associated left nipple retraction, and with associated diffuse skin thickening on the left. 2. No enlarged or morphologically abnormal lymph nodes are seen in the left axilla by ultrasound. 3. No evidence of malignancy within the right breast. RECOMMENDATION: Per current treatment plan for patient's known left breast cancer. I have discussed the findings and recommendations with the patient. Results were also provided in writing at the conclusion of the visit. If applicable, a reminder letter will be sent to the patient regarding the next appointment. BI-RADS CATEGORY  6: Known biopsy-proven malignancy. Electronically Signed   By: Franki Cabot M.D.   On: 08/31/2016 15:46     Assessment and plan- Patient is a 76 y.o. 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 ER PR positive her 2 negative  Discussed results of PET CT scan. Besides the left breast inflammatrory cancer, patient has low voulme bone only metastases. I will plan to give her 4 cycles of dose AC as planned starting today. After 4 cycles, depending on her response, she can proceed to mastectomy. I will plan to give her AI + ibrance for bone only mets after mastectomy. I again discussed the risks and benefits of Adriamycin and Cytoxan including all but not limited to nausea, vomiting, fatigue, hair loss, risk of cardiotoxicity with anthracycline, low blood counts and  risk of hospitalizations or infections. Patient understands and agrees to proceed. I will see her back in 2 weeks' time with CBC, CMP and magnesium for cycle #2 of dose dense AC with neulasta support.    I will discuss role of zometa at next visit for bone metastases   Visit Diagnosis 1. Bone metastases ([place])   2. Inflammatory carcinoma of left breast ([place])   3. Encounter for antineoplastic chemotherapy      Dr. Randa Evens, MD, MPH [facility] Pager-  [phone] 09/17/2016  12:00 PM

## 2016-09-23 ENCOUNTER — Telehealth: Payer: Self-pay | Admitting: *Deleted

## 2016-09-23 MED ORDER — DIPHENOXYLATE-ATROPINE 2.5-0.025 MG PO TABS: 1.0000 | ORAL_TABLET | Freq: Four times a day (QID) | ORAL | 0 refills | Status: DC | PRN

## 2016-09-23 NOTE — Telephone Encounter (Signed)
Lomotil QID prn #30 tabs per VO Dr Grayland Ormond

## 2016-09-23 NOTE — Telephone Encounter (Signed)
Called to report that she is having "bad bouts of diarrhea and am using Imodium" Requesting something else for the diarrhea. Reports that she is having 5 times a day of watery dark stool which she has no control of and has incontinence. Please advise

## 2016-09-23 NOTE — Telephone Encounter (Signed)
Patient informed that Lomotil is being faxed to [place]. Also educated regarding diet and BRAT Diet and to stay away from high fiber foods such as salads. She repeated back to eat bland diet. I advised she call back if no relief with new med and diet changes

## 2016-09-24 NOTE — Progress Notes (Signed)
  Oncology Nurse Navigator Documentation  Navigator Location: [facility] (09/24/16 1100)   )Navigator Encounter Type: Telephone (09/24/16 1100) Telephone: Outgoing Call;Symptom Mgt (09/24/16 1100)                     Treatment Phase: Active Tx (09/24/16 1100)   Education: Pain/ Symptom Management (09/24/16 1100)                        Time Spent with Patient: 30 (09/24/16 1100)   Patient reports she is "not feeling great", but diarrhea has improved with Lomotil.  States she is taking liquids, and small meals.  Denies nausea.  Encouraged her to call  if symptoms worsen.  Explained possibility of needing IV fluids/electrolytes. Verbalizes understanding.

## 2016-10-01 ENCOUNTER — Inpatient Hospital Stay (HOSPITAL_BASED_OUTPATIENT_CLINIC_OR_DEPARTMENT_OTHER): Payer: Medicare Other | Admitting: Oncology

## 2016-10-01 ENCOUNTER — Inpatient Hospital Stay: Payer: Medicare Other

## 2016-10-01 VITALS — BP 169/102 | HR 92 | Temp 98.7°F | Resp 18 | Wt 207.7 lb

## 2016-10-01 VITALS — BP 184/98 | HR 86

## 2016-10-01 DIAGNOSIS — C7951 Secondary malignant neoplasm of bone: Secondary | ICD-10-CM

## 2016-10-01 DIAGNOSIS — C50112 Malignant neoplasm of central portion of left female breast: Secondary | ICD-10-CM

## 2016-10-01 DIAGNOSIS — Z170 Estrogen receptor positive status [ER+]: Secondary | ICD-10-CM

## 2016-10-01 DIAGNOSIS — Z79899 Other long term (current) drug therapy: Secondary | ICD-10-CM

## 2016-10-01 DIAGNOSIS — C50012 Malignant neoplasm of nipple and areola, left female breast: Secondary | ICD-10-CM

## 2016-10-01 DIAGNOSIS — C50912 Malignant neoplasm of unspecified site of left female breast: Secondary | ICD-10-CM

## 2016-10-01 DIAGNOSIS — Z5111 Encounter for antineoplastic chemotherapy: Secondary | ICD-10-CM

## 2016-10-01 DIAGNOSIS — C801 Malignant (primary) neoplasm, unspecified: Secondary | ICD-10-CM

## 2016-10-01 LAB — CBC WITH DIFFERENTIAL/PLATELET
BASOS ABS: 0.1 10*3/uL (ref 0–0.1)
Basophils Relative: 1 %
EOS ABS: 0 10*3/uL (ref 0–0.7)
Eosinophils Relative: 0 %
HEMATOCRIT: 35.3 % (ref 35.0–47.0)
HEMOGLOBIN: 12.2 g/dL (ref 12.0–16.0)
Lymphocytes Relative: 10 %
Lymphs Abs: 1.4 10*3/uL (ref 1.0–3.6)
MCH: 30.5 pg (ref 26.0–34.0)
MCHC: 34.5 g/dL (ref 32.0–36.0)
MCV: 88.6 fL (ref 80.0–100.0)
MONOS PCT: 4 %
Monocytes Absolute: 0.6 10*3/uL (ref 0.2–0.9)
NEUTROS ABS: 11.4 10*3/uL — AB (ref 1.4–6.5)
NEUTROS PCT: 85 %
Platelets: 215 10*3/uL (ref 150–440)
RBC: 3.99 MIL/uL (ref 3.80–5.20)
RDW: 14.5 % (ref 11.5–14.5)
WBC: 13.5 10*3/uL — AB (ref 3.6–11.0)

## 2016-10-01 LAB — COMPREHENSIVE METABOLIC PANEL
ALBUMIN: 3.6 g/dL (ref 3.5–5.0)
ALK PHOS: 179 U/L — AB (ref 38–126)
ALT: 21 U/L (ref 14–54)
ANION GAP: 8 (ref 5–15)
AST: 28 U/L (ref 15–41)
BILIRUBIN TOTAL: 0.7 mg/dL (ref 0.3–1.2)
BUN: 11 mg/dL (ref 6–20)
CALCIUM: 9.2 mg/dL (ref 8.9–10.3)
CO2: 25 mmol/L (ref 22–32)
Chloride: 106 mmol/L (ref 101–111)
Creatinine, Ser: 0.58 mg/dL (ref 0.44–1.00)
GFR calc Af Amer: >60 mL/min (ref >60)
GFR calc non Af Amer: >60 mL/min (ref >60)
Glucose, Bld: 134 mg/dL — ABNORMAL HIGH (ref 65–99)
Potassium: 4 mmol/L (ref 3.5–5.1)
SODIUM: 139 mmol/L (ref 135–145)
Total Protein: 7 g/dL (ref 6.5–8.1)

## 2016-10-01 MED ORDER — PALONOSETRON HCL INJECTION 0.25 MG/5ML
0.2500 mg | Freq: Once | INTRAVENOUS | Status: AC
Start: 2016-10-01 — End: 2016-10-01
  Administered 2016-10-01: 0.25 mg via INTRAVENOUS
  Filled 2016-10-01: qty 5

## 2016-10-01 MED ORDER — HEPARIN SOD (PORK) LOCK FLUSH 100 UNIT/ML IV SOLN
500.0000 [IU] | Freq: Once | INTRAVENOUS | Status: DC | PRN
  Filled 2016-10-01: qty 5

## 2016-10-01 MED ORDER — SODIUM CHLORIDE 0.9 % IV SOLN
Freq: Once | INTRAVENOUS | Status: AC
  Administered 2016-10-01: 10:00:00 via INTRAVENOUS
  Filled 2016-10-01: qty 5

## 2016-10-01 MED ORDER — SODIUM CHLORIDE 0.9 % IV SOLN
600.0000 mg/m2 | Freq: Once | INTRAVENOUS | Status: AC
  Administered 2016-10-01: 1240 mg via INTRAVENOUS
  Filled 2016-10-01: qty 12

## 2016-10-01 MED ORDER — LORAZEPAM 0.5 MG PO TABS: 0.5000 mg | ORAL_TABLET | Freq: Four times a day (QID) | ORAL | 0 refills | Status: DC | PRN

## 2016-10-01 MED ORDER — SODIUM CHLORIDE 0.9% FLUSH
10.0000 mL | Freq: Once | INTRAVENOUS | Status: AC
  Administered 2016-10-01: 10 mL via INTRAVENOUS
  Filled 2016-10-01: qty 10

## 2016-10-01 MED ORDER — SODIUM CHLORIDE 0.9 % IV SOLN
Freq: Once | INTRAVENOUS | Status: AC
  Administered 2016-10-01: 10:00:00 via INTRAVENOUS
  Filled 2016-10-01: qty 1000

## 2016-10-01 MED ORDER — DOXORUBICIN HCL CHEMO IV INJECTION 2 MG/ML
60.0000 mg/m2 | Freq: Once | INTRAVENOUS | Status: AC
  Administered 2016-10-01: 124 mg via INTRAVENOUS
  Filled 2016-10-01: qty 62

## 2016-10-01 MED ORDER — PEGFILGRASTIM 6 MG/0.6ML ~~LOC~~ PSKT
6.0000 mg | PREFILLED_SYRINGE | Freq: Once | SUBCUTANEOUS | Status: AC
  Administered 2016-10-01: 6 mg via SUBCUTANEOUS
  Filled 2016-10-01: qty 0.6

## 2016-10-01 MED ORDER — HEPARIN SOD (PORK) LOCK FLUSH 100 UNIT/ML IV SOLN
500.0000 [IU] | Freq: Once | INTRAVENOUS | Status: AC
  Administered 2016-10-01: 500 [IU] via INTRAVENOUS

## 2016-10-01 MED ORDER — ONDANSETRON HCL 8 MG PO TABS: 8.0000 mg | ORAL_TABLET | Freq: Two times a day (BID) | ORAL | 1 refills | Status: DC | PRN

## 2016-10-01 NOTE — Progress Notes (Signed)
Here for follow up. Bp 169/102, pt asymptomatic. Will repeat

## 2016-10-02 LAB — CANCER ANTIGEN 27.29: CA 27.29: 629.7 U/mL — AB (ref 0.0–38.6)

## 2016-10-05 ENCOUNTER — Encounter: Payer: Self-pay | Admitting: Oncology

## 2016-10-05 NOTE — Progress Notes (Signed)
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 Patient is a 76 year old female with no significant comorbidities who noticed left breast mass about 6-9 months ago. She thought it would go awaybut it continued to increase in size and began to involve her skin. She also noted tenderness to palpation as well as intermittent sharp tingling pain. She was seen by Dr. Adora Fridge on 08/25/2016 and patient had a biopsy of her skin in his office which showed:DIAGNOSIS:  A. LEFT BREAST SKIN; EXCISION:  - INVASIVE CARCINOMA MORPHOLOGICALLY CONSISTENT WITH MAMMARY ORIGIN  INVOLVING THE DERMIS.   BREAST BIOMARKER TESTS  Estrogen Receptor (ER) Status: POSITIVE, >90%  Progesterone Receptor (PgR) Status: POSITIVE, >70%  Her2 negative  2. Patient underwent bilateral diagnostic mammogram on 08/31/2016 showed:IMPRESSION: 1. Known left breast cancer, retroareolar with probable extension to the nipple, measuring 4.3 cm greatest dimension by ultrasound, with associated architectural distortion and associated left nipple retraction, and with associated diffuse skin thickening on the left. 2. No enlarged or morphologically abnormal lymph nodes are seen in the left axilla by ultrasound. 3. No evidence of malignancy within the right breast.  3. Patient was also complaining of some perirectal pain and underwent CT abdomen pelvis with contrast on 09/02/2016 which showed: IMPRESSION: Left colonic diverticulosis. No active diverticulitis.Fatty liver. No acute findings or evidence of  metastatic disease in the abdomen or pelvis. Diffuse bony sclerotic metastases.  4. Other than that her breast pain patient overall feels well. Denies any unintentional weight loss. She continues to be active and care for her great-grandchildren. She has had a hysterectomy in the past. No family history of breast or ovarian cancer.  5. Given that she had inflammatory breast cancer- plan was to give 4 cycles of dose dense AC followed by possible mastectomy and treat bone mets with AI + ibrance  6. PET CT scan on 09/11/16 showed: IMPRESSION: 1. Low-grade but abnormal hypermetabolic activity associated with the cutaneous and sub areolar glandular tissues of the left breast, compatible with malignancy, representative SUV 3.9 compared to contralateral normal side 1.4. 2. Diffuse sclerotic osseous metastatic disease with low-grade hypermetabolic activity, a representative SUV along the left sacrum 5.4. 3. Other imaging findings of potential clinical significance: Aortic Atherosclerosis (ICD10-I70.0). Descending and sigmoid colon Diverticulosis.  7. Baseline MUGA scan showed EF of 71%. Baseline CA 27.29 elevated at 563.2   Interval history- she is tolerating chemotherapy well. Denies any complaints other than mild fatigue  ECOG PS- 0 Pain scale- 0 Opioid associated constipation- no  Review of systems- Review of Systems  Constitutional: Positive for malaise/fatigue. Negative for chills, fever and weight loss.  HENT: Negative for congestion, ear discharge and nosebleeds.   Eyes: Negative for blurred vision.  Respiratory: Negative for cough, hemoptysis, sputum production, shortness of breath and wheezing.   Cardiovascular: Negative for chest pain, palpitations, orthopnea and claudication.  Gastrointestinal: Negative for abdominal pain, blood in stool, constipation, diarrhea, heartburn, melena, nausea and vomiting.  Genitourinary: Negative for dysuria, flank pain, frequency, hematuria and  urgency.  Musculoskeletal: Negative for back pain, joint pain and myalgias.  Skin:  Negative for rash.  Neurological: Negative for dizziness, tingling, focal weakness, seizures, weakness and headaches.  Endo/Heme/Allergies: Does not bruise/bleed easily.  Psychiatric/Behavioral: Negative for depression and suicidal ideas. The patient does not have insomnia.        No Known Allergies   Past Medical History:  Diagnosis Date  . Cancer ([place])   . Port catheter in place 09/14/2016   Placed 09/10/2016 RT chest.     Past Surgical History:  Procedure Laterality Date  . ABDOMINAL HYSTERECTOMY    . PORTACATH PLACEMENT Right 09/10/2016   Procedure: INSERTION PORT-A-CATH;  Surgeon: Jules Husbands, MD;  Location: [facility] ORS;  Service: General;  Laterality: Right;    Social History   Social History  . Marital status: Married    Spouse name: N/A  . Number of children: N/A  . Years of education: N/A   Occupational History  . Not on file.   Social History Main Topics  . Smoking status: Never Smoker  . Smokeless tobacco: Never Used  . Alcohol use No  . Drug use: No  . Sexual activity: No   Other Topics Concern  . Not on file   Social History Narrative  . No narrative on file    No family history on file.   Current Outpatient Prescriptions:  .  dexamethasone (DECADRON) 4 MG tablet, Take 2 tablets by mouth once a day on the day after chemotherapy and then take 2 tablets two times a day for 2 days. Take with food., Disp: 30 tablet, Rfl: 1 .  diphenoxylate-atropine (LOMOTIL) 2.5-0.025 MG tablet, Take 1 tablet by mouth 4 (four) times daily as needed for diarrhea or loose stools., Disp: 30 tablet, Rfl: 0 .  lidocaine-prilocaine (EMLA) cream, Apply to affected area once, Disp: 30 g, Rfl: 3 .  LORazepam (ATIVAN) 0.5 MG tablet, Take 1 tablet (0.5 mg total) by mouth every 6 (six) hours as needed (Nausea or vomiting)., Disp: 30 tablet, Rfl: 0 .  ondansetron (ZOFRAN) 8 MG tablet, Take 1  tablet (8 mg total) by mouth 2 (two) times daily as needed. Start on the third day after chemotherapy., Disp: 30 tablet, Rfl: 1 .  prochlorperazine (COMPAZINE) 10 MG tablet, Take 1 tablet (10 mg total) by mouth every 6 (six) hours as needed (Nausea or vomiting)., Disp: 30 tablet, Rfl: 1 .  Aspirin-Acetaminophen-Caffeine (EXCEDRIN EXTRA STRENGTH PO), Take 1 tablet by mouth daily as needed (pain)., Disp: , Rfl:  .  HYDROcodone-acetaminophen (NORCO/VICODIN) 5-325 MG tablet, Take 1-2 tablets by mouth every 6 (six) hours as needed for moderate pain. (Patient not taking: Reported on 09/17/2016), Disp: 30 tablet, Rfl: 0  Physical exam:  Vitals:   10/01/16 0858  BP: (!) 169/102  Pulse: 92  Resp: 18  Temp: 98.7 F (37.1 C)  TempSrc: Tympanic  Weight: 207 lb 11.2 oz (94.2 kg)   Physical Exam  Constitutional: She is oriented to person, place, and time and well-developed, well-nourished, and in no distress.  HENT:  Head: Normocephalic and atraumatic.  Eyes: EOM are normal. Pupils are equal, round, and reactive to light.  Neck: Normal range of motion.  Cardiovascular: Normal rate, regular rhythm and normal heart sounds.   Pulmonary/Chest: Effort normal and breath sounds normal.  Abdominal: Soft. Bowel sounds are normal.  Neurological: She is alert and oriented to person, place, and time.  Skin: Skin is warm and dry.   left breast mass remains unchanged. Hard mass noted under the nipple and involving the skin without obvious inflammatory changes  CMP Latest Ref Rng & Units 10/01/2016  Glucose 65 - 99 mg/dL 134(H)  BUN 6 - 20 mg/dL 11  Creatinine 0.44 - 1.00 mg/dL 0.58  Sodium 135 - 145 mmol/L 139  Potassium 3.5 - 5.1 mmol/L 4.0  Chloride 101 - 111 mmol/L 106  CO2 22 - 32 mmol/L 25  Calcium 8.9 - 10.3 mg/dL 9.2  Total Protein 6.5 - 8.1 g/dL 7.0  Total Bilirubin 0.3 - 1.2 mg/dL 0.7  Alkaline Phos 38 - 126 U/L 179(H)  AST 15 - 41 U/L 28  ALT 14 - 54 U/L 21   CBC Latest Ref Rng & Units  10/01/2016  WBC 3.6 - 11.0 K/uL 13.5(H)  Hemoglobin 12.0 - 16.0 g/dL 12.2  Hematocrit 35.0 - 47.0 % 35.3  Platelets 150 - 440 K/uL 215    No images are attached to the encounter.  Nm Cardiac Muga Rest  Result Date: 09/14/2016 CLINICAL DATA:  LEFT breast cancer, pre cardiotoxic chemotherapy EXAM: NUCLEAR MEDICINE CARDIAC BLOOD POOL IMAGING (MUGA) TECHNIQUE: Cardiac multi-gated acquisition was performed at rest following intravenous injection of Tc-5mlabeled red blood cells. RADIOPHARMACEUTICALS:  21.445 mCi Tc-953mertechnetate in-vitro labeled autologous red blood cells IV COMPARISON:  None FINDINGS: Calculated LEFT ventricular ejection fraction is 71% which is within the normal range. Study was obtained at at a cardiac rate of 78 beats per minute. Patient was rhythmic during imaging. Cine analysis of the LEFT ventricle in 3 projections demonstrates normal LEFT ventricular wall motion. IMPRESSION: Normal LEFT ventricular ejection fraction of 71%. Normal LV wall motion. Electronically Signed   By: MaLavonia Dana.D.   On: 09/14/2016 15:42   Nm Pet Image Initial (pi) Skull Base To Thigh  Result Date: 09/11/2016 CLINICAL DATA:  Initial treatment strategy for metastatic left breast cancer. EXAM: NUCLEAR MEDICINE PET SKULL BASE TO THIGH TECHNIQUE: 12.5 mCi F-18 FDG was injected intravenously. Full-ring PET imaging was performed from the skull base to thigh after the radiotracer. CT data was obtained and used for attenuation correction and anatomic localization. FASTING BLOOD GLUCOSE:  Value: 122 mg/dl COMPARISON:  Multiple exams, including CT scan from 09/02/2016 FINDINGS: NECK No hypermetabolic lymph nodes in the neck. CHEST Right Port-A-Cath tip:  SVC. Retracted and possibly inverted left nipple with associated skin thickening and faint scan hypermetabolic activity as well as retroareolar indistinctly marginated accentuated tissues with maximum SUV 3.9. In contrast a similar region on the right has a  maximum SUV of 1.4. There is stranding around a faintly metabolic left axillary node measuring about 7 mm in short axis diameter, maximum SUV of this node shown on image 86/4 is 1.4. Mild cardiomegaly. ABDOMEN/PELVIS No abnormal hypermetabolic activity within the liver, pancreas, adrenal glands, or spleen. No hypermetabolic lymph nodes in the abdomen or pelvis. Aortoiliac atherosclerotic vascular disease. Descending and sigmoid colon diverticulosis. SKELETON Diffuse osseous sclerotic metastatic disease especially involving the axial skeleton but with some involvement of the appendicular skeleton. Low-grade associated hypermetabolic activity. A representative portion of the left upper sacrum has a maximum SUV of 5.4. Overall the abnormal bony uptake is low-grade in generalized in the regions of diffuse osseous metastatic disease, rather than focal. IMPRESSION: 1. Low-grade but abnormal hypermetabolic activity associated with the cutaneous and sub areolar glandular tissues of the left breast, compatible with malignancy, representative SUV 3.9 compared to contralateral normal side 1.4. 2. Diffuse sclerotic osseous metastatic disease with low-grade hypermetabolic activity, a representative SUV along the left sacrum 5.4. 3. Other imaging findings of potential clinical significance: Aortic  Atherosclerosis (ICD10-I70.0). Descending and sigmoid colon diverticulosis. Electronically Signed   By: Van Clines M.D.   On: 09/11/2016 11:48   Dg Chest Port 1 View  Result Date: 09/10/2016 CLINICAL DATA:  Status post port insertion. EXAM: PORTABLE CHEST 1 VIEW COMPARISON:  Fluoroscopy from earlier today FINDINGS: Right IJ porta catheter with tip at the SVC level. The venotomy is somewhat high in the neck such that the upper catheter was not covered. No visualized kink. No pneumothorax. Cardiomegaly. Mild atelectasis. IMPRESSION: 1. New porta catheter with tip at the SVC level. 2. No evidence of pneumothorax. 3. Small  portion of the catheter is not seen in the neck. Electronically Signed   By: Monte Fantasia M.D.   On: 09/10/2016 13:55   Dg C-arm 1-60 Min-no Report  Result Date: 09/10/2016 Fluoroscopy was utilized by the requesting physician.  No radiographic interpretation.     Assessment and plan- Patient is a 77 y.o. 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 Counts ok to proceed with cycle 2 of dose dense AC today. Chemo is given primarily to shrink the large breast mass, prevent skin ulceration and breakdown and possible toilet mastectomy down the line based on response.   2. Also discussed risks and benefits of zometa for bone mets. She needs to get in touch with her dentist for dental clearance prior ti starting it given risk of osetonecrosis of jaw   Visit Diagnosis 1. Malignant neoplasm of left breast in female, estrogen receptor positive, unspecified site of breast ([place])   2. Bone metastases ([place])   3. Primary cancer of left breast with metastasis to other site ([place])   4. 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 Randa Evens, MD, MPH [facility] Pager- [phone] 10/05/2016 10:27 AM

## 2016-10-09 ENCOUNTER — Inpatient Hospital Stay
Admission: EM | Admit: 2016-10-09 | Discharge: 2016-10-12 | DRG: 193 | Disposition: A | Discharge status: Home / Self Care | Payer: Medicare Other | Attending: Internal Medicine | Admitting: Internal Medicine

## 2016-10-09 ENCOUNTER — Telehealth: Payer: Self-pay | Admitting: *Deleted

## 2016-10-09 ENCOUNTER — Emergency Department: Payer: Medicare Other

## 2016-10-09 ENCOUNTER — Other Ambulatory Visit: Payer: Self-pay | Admitting: Oncology

## 2016-10-09 DIAGNOSIS — Z820 Family history of epilepsy and other diseases of the nervous system: Secondary | ICD-10-CM

## 2016-10-09 DIAGNOSIS — I517 Cardiomegaly: Secondary | ICD-10-CM | POA: Diagnosis not present

## 2016-10-09 DIAGNOSIS — C50112 Malignant neoplasm of central portion of left female breast: Secondary | ICD-10-CM | POA: Diagnosis not present

## 2016-10-09 DIAGNOSIS — Z170 Estrogen receptor positive status [ER+]: Secondary | ICD-10-CM

## 2016-10-09 DIAGNOSIS — D709 Neutropenia, unspecified: Secondary | ICD-10-CM | POA: Diagnosis present

## 2016-10-09 DIAGNOSIS — K5730 Diverticulosis of large intestine without perforation or abscess without bleeding: Secondary | ICD-10-CM

## 2016-10-09 DIAGNOSIS — Z90710 Acquired absence of both cervix and uterus: Secondary | ICD-10-CM | POA: Diagnosis not present

## 2016-10-09 DIAGNOSIS — J189 Pneumonia, unspecified organism: Secondary | ICD-10-CM | POA: Diagnosis present

## 2016-10-09 DIAGNOSIS — D701 Agranulocytosis secondary to cancer chemotherapy: Secondary | ICD-10-CM | POA: Diagnosis not present

## 2016-10-09 DIAGNOSIS — E876 Hypokalemia: Secondary | ICD-10-CM | POA: Diagnosis not present

## 2016-10-09 DIAGNOSIS — C50912 Malignant neoplasm of unspecified site of left female breast: Secondary | ICD-10-CM | POA: Diagnosis present

## 2016-10-09 DIAGNOSIS — C7951 Secondary malignant neoplasm of bone: Secondary | ICD-10-CM | POA: Diagnosis present

## 2016-10-09 DIAGNOSIS — Z809 Family history of malignant neoplasm, unspecified: Secondary | ICD-10-CM | POA: Diagnosis not present

## 2016-10-09 DIAGNOSIS — R5081 Fever presenting with conditions classified elsewhere: Secondary | ICD-10-CM | POA: Diagnosis not present

## 2016-10-09 DIAGNOSIS — D61818 Other pancytopenia: Secondary | ICD-10-CM | POA: Diagnosis not present

## 2016-10-09 DIAGNOSIS — T451X5A Adverse effect of antineoplastic and immunosuppressive drugs, initial encounter: Secondary | ICD-10-CM | POA: Diagnosis present

## 2016-10-09 DIAGNOSIS — Z79899 Other long term (current) drug therapy: Secondary | ICD-10-CM

## 2016-10-09 DIAGNOSIS — R197 Diarrhea, unspecified: Secondary | ICD-10-CM | POA: Diagnosis not present

## 2016-10-09 DIAGNOSIS — Z7982 Long term (current) use of aspirin: Secondary | ICD-10-CM

## 2016-10-09 DIAGNOSIS — E871 Hypo-osmolality and hyponatremia: Secondary | ICD-10-CM | POA: Diagnosis present

## 2016-10-09 DIAGNOSIS — I10 Essential (primary) hypertension: Secondary | ICD-10-CM | POA: Diagnosis present

## 2016-10-09 DIAGNOSIS — E878 Other disorders of electrolyte and fluid balance, not elsewhere classified: Secondary | ICD-10-CM | POA: Diagnosis present

## 2016-10-09 DIAGNOSIS — D61810 Antineoplastic chemotherapy induced pancytopenia: Secondary | ICD-10-CM | POA: Diagnosis present

## 2016-10-09 DIAGNOSIS — K521 Toxic gastroenteritis and colitis: Secondary | ICD-10-CM | POA: Diagnosis present

## 2016-10-09 DIAGNOSIS — K760 Fatty (change of) liver, not elsewhere classified: Secondary | ICD-10-CM

## 2016-10-09 HISTORY — DX: Essential (primary) hypertension: I10

## 2016-10-09 LAB — CBC WITH DIFFERENTIAL/PLATELET
BASOS PCT: 2 %
Band Neutrophils: 8 %
Basophils Absolute: 0 10*3/uL (ref 0–0.1)
Blasts: 0 %
Eosinophils Absolute: 0 10*3/uL (ref 0–0.7)
Eosinophils Relative: 0 %
HEMATOCRIT: 31.9 % — AB (ref 35.0–47.0)
HEMOGLOBIN: 11.1 g/dL — AB (ref 12.0–16.0)
Lymphocytes Relative: 49 %
Lymphs Abs: 0.1 10*3/uL — ABNORMAL LOW (ref 1.0–3.6)
MCH: 30.4 pg (ref 26.0–34.0)
MCHC: 34.8 g/dL (ref 32.0–36.0)
MCV: 87.3 fL (ref 80.0–100.0)
MONO ABS: 0 10*3/uL — AB (ref 0.2–0.9)
Metamyelocytes Relative: 0 %
Monocytes Relative: 10 %
Myelocytes: 0 %
NRBC: 0 /100{WBCs}
Neutro Abs: 0.2 10*3/uL — ABNORMAL LOW (ref 1.4–6.5)
Neutrophils Relative %: 31 %
Other: 0 %
PROMYELOCYTES ABS: 0 %
Platelets: 61 10*3/uL — ABNORMAL LOW (ref 150–440)
RBC: 3.65 MIL/uL — ABNORMAL LOW (ref 3.80–5.20)
RDW: 14.5 % (ref 11.5–14.5)
WBC: 0.3 10*3/uL — CL (ref 3.6–11.0)

## 2016-10-09 LAB — URINALYSIS, COMPLETE (UACMP) WITH MICROSCOPIC
BACTERIA UA: NONE SEEN
Bilirubin Urine: NEGATIVE
Glucose, UA: NEGATIVE mg/dL
Ketones, ur: NEGATIVE mg/dL
Leukocytes, UA: NEGATIVE
Nitrite: NEGATIVE
PROTEIN: NEGATIVE mg/dL
Specific Gravity, Urine: 1.014 (ref 1.005–1.030)
pH: 6 (ref 5.0–8.0)

## 2016-10-09 LAB — COMPREHENSIVE METABOLIC PANEL
ALT: 19 U/L (ref 14–54)
AST: 20 U/L (ref 15–41)
Albumin: 3.4 g/dL — ABNORMAL LOW (ref 3.5–5.0)
Alkaline Phosphatase: 136 U/L — ABNORMAL HIGH (ref 38–126)
Anion gap: 10 (ref 5–15)
BUN: 11 mg/dL (ref 6–20)
CHLORIDE: 95 mmol/L — AB (ref 101–111)
CO2: 22 mmol/L (ref 22–32)
CREATININE: 0.53 mg/dL (ref 0.44–1.00)
Calcium: 8 mg/dL — ABNORMAL LOW (ref 8.9–10.3)
GFR calc Af Amer: >60 mL/min (ref >60)
GFR calc non Af Amer: >60 mL/min (ref >60)
Glucose, Bld: 150 mg/dL — ABNORMAL HIGH (ref 65–99)
Potassium: 3.7 mmol/L (ref 3.5–5.1)
SODIUM: 127 mmol/L — AB (ref 135–145)
Total Bilirubin: 1.2 mg/dL (ref 0.3–1.2)
Total Protein: 6.5 g/dL (ref 6.5–8.1)

## 2016-10-09 LAB — LACTIC ACID, PLASMA: Lactic Acid, Venous: 1.4 mmol/L (ref 0.5–1.9)

## 2016-10-09 LAB — LIPASE, BLOOD: Lipase: 17 U/L (ref 11–51)

## 2016-10-09 LAB — TROPONIN I

## 2016-10-09 MED ORDER — AMLODIPINE BESYLATE 5 MG PO TABS
ORAL_TABLET | ORAL | Status: AC
  Administered 2016-10-09: 5 mg via ORAL
  Filled 2016-10-09: qty 1

## 2016-10-09 MED ORDER — SODIUM CHLORIDE 0.9 % IV SOLN
INTRAVENOUS | Status: DC
  Administered 2016-10-09 – 2016-10-10 (×2): via INTRAVENOUS

## 2016-10-09 MED ORDER — AMLODIPINE BESYLATE 5 MG PO TABS
5.0000 mg | ORAL_TABLET | Freq: Every day | ORAL | Status: DC
  Administered 2016-10-09 – 2016-10-12 (×5): 5 mg via ORAL
  Filled 2016-10-09 (×3): qty 1

## 2016-10-09 MED ORDER — TBO-FILGRASTIM 480 MCG/0.8ML ~~LOC~~ SOSY
480.0000 ug | PREFILLED_SYRINGE | Freq: Once | SUBCUTANEOUS | Status: AC
  Administered 2016-10-09: 22:00:00 480 ug via SUBCUTANEOUS
  Filled 2016-10-09 (×2): qty 0.8

## 2016-10-09 MED ORDER — SODIUM CHLORIDE 0.9 % IV BOLUS (SEPSIS)
1000.0000 mL | Freq: Once | INTRAVENOUS | Status: AC
  Administered 2016-10-09: 1000 mL via INTRAVENOUS

## 2016-10-09 MED ORDER — ONDANSETRON HCL 4 MG PO TABS: 4.0000 mg | ORAL_TABLET | Freq: Four times a day (QID) | ORAL | Status: DC | PRN

## 2016-10-09 MED ORDER — VANCOMYCIN HCL IN DEXTROSE 1-5 GM/200ML-% IV SOLN
1000.0000 mg | Freq: Once | INTRAVENOUS | Status: AC
  Administered 2016-10-09: 1000 mg via INTRAVENOUS
  Filled 2016-10-09: qty 200

## 2016-10-09 MED ORDER — ONDANSETRON HCL 4 MG/2ML IJ SOLN: 4.0000 mg | Freq: Four times a day (QID) | INTRAMUSCULAR | Status: DC | PRN

## 2016-10-09 MED ORDER — DEXTROSE 5 % IV SOLN
2.0000 g | Freq: Three times a day (TID) | INTRAVENOUS | Status: DC
  Administered 2016-10-09 – 2016-10-12 (×8): 2 g via INTRAVENOUS
  Filled 2016-10-09 (×10): qty 2

## 2016-10-09 MED ORDER — ENOXAPARIN SODIUM 40 MG/0.4ML ~~LOC~~ SOLN
40.0000 mg | SUBCUTANEOUS | Status: DC
  Administered 2016-10-09 – 2016-10-11 (×3): 40 mg via SUBCUTANEOUS
  Filled 2016-10-09 (×3): qty 0.4

## 2016-10-09 MED ORDER — HYDROCHLOROTHIAZIDE 12.5 MG PO CAPS: 12.5000 mg | ORAL_CAPSULE | Freq: Every day | ORAL | Status: DC

## 2016-10-09 MED ORDER — PROCHLORPERAZINE MALEATE 10 MG PO TABS
10.0000 mg | ORAL_TABLET | Freq: Four times a day (QID) | ORAL | Status: DC | PRN
  Filled 2016-10-09: qty 1

## 2016-10-09 MED ORDER — ACETAMINOPHEN 325 MG PO TABS
650.0000 mg | ORAL_TABLET | Freq: Four times a day (QID) | ORAL | Status: DC | PRN
  Administered 2016-10-09: 650 mg via ORAL
  Filled 2016-10-09: qty 2

## 2016-10-09 MED ORDER — IPRATROPIUM-ALBUTEROL 0.5-2.5 (3) MG/3ML IN SOLN
3.0000 mL | Freq: Four times a day (QID) | RESPIRATORY_TRACT | Status: DC
  Administered 2016-10-09 – 2016-10-10 (×3): 3 mL via RESPIRATORY_TRACT
  Filled 2016-10-09 (×3): qty 3

## 2016-10-09 MED ORDER — ACETAMINOPHEN 650 MG RE SUPP: 650.0000 mg | Freq: Four times a day (QID) | RECTAL | Status: DC | PRN

## 2016-10-09 MED ORDER — PROCHLORPERAZINE EDISYLATE 5 MG/ML IJ SOLN
10.0000 mg | Freq: Once | INTRAMUSCULAR | Status: AC
  Administered 2016-10-09: 10 mg via INTRAVENOUS
  Filled 2016-10-09: qty 2

## 2016-10-09 MED ORDER — PIPERACILLIN-TAZOBACTAM 3.375 G IVPB 30 MIN
3.3750 g | Freq: Once | INTRAVENOUS | Status: AC
  Administered 2016-10-09: 3.375 g via INTRAVENOUS
  Filled 2016-10-09 (×2): qty 50

## 2016-10-09 MED ORDER — VANCOMYCIN HCL IN DEXTROSE 1-5 GM/200ML-% IV SOLN
1000.0000 mg | INTRAVENOUS | Status: DC
  Administered 2016-10-10 – 2016-10-11 (×3): 1000 mg via INTRAVENOUS
  Filled 2016-10-09 (×5): qty 200

## 2016-10-09 MED ORDER — LORAZEPAM 0.5 MG PO TABS: 0.5000 mg | ORAL_TABLET | Freq: Four times a day (QID) | ORAL | Status: DC | PRN

## 2016-10-09 NOTE — Telephone Encounter (Signed)
Husband called to report that patient is having fever of 102 last night she is currently having chills and her temp is 99. She has had diarrhea and she is nauseated.   Per Dr Janese Banks patient to go to ER. Husband informed of this and agrees to take her to ER

## 2016-10-09 NOTE — ED Triage Notes (Signed)
Pt is currently getting chemotherapy for stage 4 CA, C/O N/V/D with fever and bodyaches for the past 3 days, states she took tylenol last at 7am this morning. Next treatment is 7/4.

## 2016-10-09 NOTE — ED Notes (Signed)
Zosyn not in pyxix, pharmacy will tube it up.

## 2016-10-09 NOTE — ED Provider Notes (Signed)
[facility] Emergency Department Provider Note  ____________________________________________   First MD Initiated Contact with Patient 10/09/16 1233     (approximate)  I have reviewed the triage vital signs and the nursing notes.   HISTORY  Chief Complaint Dizziness and Fever   HPI Victoria Steele is a 76 y.o. female with a history of stage IV breast cancer who is presenting to the emergency department today with a fever, nausea as well as diarrhea and cough over the past 3 days.  She says that she had chemotherapy last Thursday which was her second treatment. She says that she had similar symptoms after the first treatment but without fever. She's had worsening symptoms over the past 3 days and took Tylenol once last night for fever of 102. Had a temperature to 99 this morning at about 7 AM and took a second dose of Tylenol. Has had nausea but no vomiting. About 9 episodes of diarrhea which has been nonbloody over the past 24 hours. However, she notes that after she wiped herself there is a small amount of blood on toilet tissue. She is also reporting a minimally productive cough. However, the patient when asked is not able to tell what color sputum. She said that she called the cancer center earlier today who told her to report to the emergency department for further evaluation.   Past Medical History:  Diagnosis Date  . Cancer ([place])   . Port catheter in place 09/14/2016   Placed 09/10/2016 RT chest.    Patient Active Problem List   Diagnosis Date Noted  . Cancer ([place])   . Goals of care, counseling/discussion 09/04/2016  . Breast cancer in female [facility]) 09/04/2016  . Bone metastases ([place]) 09/04/2016    Past Surgical History:  Procedure Laterality Date  . ABDOMINAL HYSTERECTOMY    . PORTACATH PLACEMENT Right 09/10/2016   Procedure: INSERTION PORT-A-CATH;  Surgeon: Jules Husbands, MD;  Location: [facility] ORS;  Service: General;  Laterality: Right;    Prior  to Admission medications   Medication Sig Start Date End Date Taking? Authorizing Provider  acetaminophen (TYLENOL) 500 MG tablet Take 1,000 mg by mouth every 6 (six) hours as needed for moderate pain.   Yes [provider]  dexamethasone (DECADRON) 4 MG tablet Take 2 tablets by mouth once a day on the day after chemotherapy and then take 2 tablets two times a day for 2 days. Take with food. 09/04/16  Yes Sindy Guadeloupe, MD  diphenoxylate-atropine (LOMOTIL) 2.5-0.025 MG tablet Take 1 tablet by mouth 4 (four) times daily as needed for diarrhea or loose stools. 09/23/16  Yes Lloyd Huger, MD  hydrochlorothiazide (MICROZIDE) 12.5 MG capsule Take 12.5 mg by mouth daily. 10/07/16  Yes [provider]  lidocaine-prilocaine (EMLA) cream Apply to affected area once 09/04/16  Yes Sindy Guadeloupe, MD  LORazepam (ATIVAN) 0.5 MG tablet Take 1 tablet (0.5 mg total) by mouth every 6 (six) hours as needed (Nausea or vomiting). Patient taking differently: Take 0.5 mg by mouth every 6 (six) hours as needed. Nausea or vomiting 10/01/16  Yes Sindy Guadeloupe, MD  ondansetron (ZOFRAN) 8 MG tablet Take 1 tablet (8 mg total) by mouth 2 (two) times daily as needed. Start on the third day after chemotherapy. Patient taking differently: Take 8 mg by mouth 2 (two) times daily as needed for nausea. Start on the third day after chemotherapy. 10/01/16  Yes Sindy Guadeloupe, MD  prochlorperazine (COMPAZINE) 10 MG  tablet Take 1 tablet (10 mg total) by mouth every 6 (six) hours as needed (Nausea or vomiting). Patient taking differently: Take 10 mg by mouth every 6 (six) hours as needed for nausea or vomiting.  09/04/16  Yes Sindy Guadeloupe, MD  HYDROcodone-acetaminophen (NORCO/VICODIN) 5-325 MG tablet Take 1-2 tablets by mouth every 6 (six) hours as needed for moderate pain. Patient not taking: Reported on 09/17/2016 09/10/16   Jules Husbands, MD    Allergies Patient has no known allergies.  No family history on  file.  Social History Social History  Substance Use Topics  . Smoking status: Never Smoker  . Smokeless tobacco: Never Used  . Alcohol use No    Review of Systems  Constitutional: fever Eyes: No visual changes. ENT: No sore throat. Cardiovascular: Denies chest pain. Respiratory: Denies shortness of breath. Gastrointestinal: No abdominal pain.    No constipation. Genitourinary: Negative for dysuria. Musculoskeletal: Negative for back pain. Skin: Negative for rash. Neurological: Negative for headaches, focal weakness or numbness.   ____________________________________________   PHYSICAL EXAM:  VITAL SIGNS: ED Triage Vitals  Enc Vitals Group     BP 10/09/16 1222 (!) 183/97     Pulse Rate 10/09/16 1222 100     Resp 10/09/16 1222 18     Temp 10/09/16 1222 99.1 F (37.3 C)     Temp Source 10/09/16 1222 Oral     SpO2 10/09/16 1222 100 %     Weight 10/09/16 1224 197 lb (89.4 kg)     Height 10/09/16 1224 5\' 2"  (1.575 m)     Head Circumference --      Peak Flow --      Pain Score 10/09/16 1222 0     Pain Loc --      Pain Edu? --      Excl. in [place]? --     Constitutional: Alert and oriented. In no acute distress. Eyes: Conjunctivae are normal.  Head: Atraumatic. Nose: No congestion/rhinnorhea. Mouth/Throat: Mucous membranes are moist.  Neck: No stridor.    Cardiovascular: Normal rate, regular rhythm. Grossly normal heart sounds. Right-sided port accessed and covered with Dermabond. No erythema or induration. Respiratory: Normal respiratory effort.  No retractions. Lungs CTAB. Gastrointestinal: Soft and nontender. No distention.  Musculoskeletal: No lower extremity tenderness nor edema.  No joint effusions. Neurologic:  Normal speech and language. No gross focal neurologic deficits are appreciated. Skin:  Skin is warm, dry and intact. No rash noted. Psychiatric: Mood and affect are normal. Speech and behavior are normal.  ____________________________________________    LABS (all labs ordered are listed, but only abnormal results are displayed)  Labs Reviewed  CBC WITH DIFFERENTIAL/PLATELET - Abnormal; Notable for the following:       Result Value   WBC 0.3 (*)    RBC 3.65 (*)    Hemoglobin 11.1 (*)    HCT 31.9 (*)    Platelets 61 (*)    Neutro Abs 0.2 (*)    Lymphs Abs 0.1 (*)    Monocytes Absolute 0.0 (*)    All other components within normal limits  COMPREHENSIVE METABOLIC PANEL - Abnormal; Notable for the following:    Sodium 127 (*)    Chloride 95 (*)    Glucose, Bld 150 (*)    Calcium 8.0 (*)    Albumin 3.4 (*)    Alkaline Phosphatase 136 (*)    All other components within normal limits  CULTURE, BLOOD (ROUTINE X 2)  CULTURE, BLOOD (ROUTINE X 2)  URINE CULTURE  GASTROINTESTINAL PANEL BY PCR, STOOL (REPLACES STOOL CULTURE)  C DIFFICILE QUICK SCREEN W PCR REFLEX  LIPASE, BLOOD  TROPONIN I  LACTIC ACID, PLASMA  URINALYSIS, COMPLETE (UACMP) WITH MICROSCOPIC  LACTIC ACID, PLASMA   ____________________________________________  EKG  ED ECG REPORT I, Brandii Lakey,  Youlanda Roys, the attending physician, personally viewed and interpreted this ECG.   Date: 10/09/2016  EKG Time: 1332  Rate: 88  Rhythm: normal sinus rhythm  Axis: Normal  Intervals:right bundle branch block  ST&T Change: T wave inversions in 3, aVF as well as V3. No ST elevations or depressions. No significant change from previous EKG of 09/03/2016.  ____________________________________________  RADIOLOGY  Chest x-ray without acute finding. ____________________________________________   PROCEDURES  Procedure(s) performed:   Procedures  Critical Care performed:   ____________________________________________   INITIAL IMPRESSION / ASSESSMENT AND PLAN / ED COURSE  Pertinent labs & imaging results that were available during my care of the patient were reviewed by me and considered in my medical decision making (see chart for details).  Patient's temperature  retaking during the exam was 102.6. Sepsis alert was called and broad-spectrum antibiotics were ordered. Stool studies also ordered.    ----------------------------------------- 2:48 PM on 10/09/2016 -----------------------------------------  Discussed case with the patient's oncologist, Dr. Janese Banks, who agrees with admitting the patient brought specimen miotics. The patient was found to have an absolute neutral count of 117. She also has a bandemia with 8 bands. The patient is aware of the need for admission. Signed out to Dr. Earleen Newport.  Unclear source for the patient's fever this time.  ____________________________________________   FINAL CLINICAL IMPRESSION(S) / ED DIAGNOSES  Neutropenic fever.    NEW MEDICATIONS STARTED DURING THIS VISIT:  New Prescriptions   No medications on file     Note:  This document was prepared using Dragon voice recognition software and may include unintentional dictation errors.     Orbie Pyo, MD 10/09/16 [phone]

## 2016-10-09 NOTE — H&P (Signed)
[facility] [place] NAME: Victoria Steele    MR#:  [id number]  DATE OF BIRTH:  [birth year]  DATE OF ADMISSION:  10/09/2016  PRIMARY CARE PHYSICIAN: Letta Median, MD   REQUESTING/REFERRING PHYSICIAN: Dr Toma Aran  CHIEF COMPLAINT:   Chief Complaint  Patient presents with  . Dizziness  . Fever    HISTORY OF PRESENT ILLNESS:  Victoria Steele  is a 76 y.o. 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 She states over the last 3 days she's been having diarrhea. She states has been 9 times over that timeframe and she does see a little bit of blood and they are probably because she's been going so often. She's had a fever of 102 shaking chills and sweats and feeling fatigued. In the ER her white blood cell count was 0.3 and was also found to have bands on the differential. Absolute neutrophil count also low. Hospitalist services were contacted for further evaluation.  PAST MEDICAL HISTORY:   Past Medical History:  Diagnosis Date  . Cancer ([place])   . Hypertension   . Port catheter in place 09/14/2016   Placed 09/10/2016 RT chest.    PAST SURGICAL HISTORY:   Past Surgical History:  Procedure Laterality Date  . ABDOMINAL HYSTERECTOMY    . APPENDECTOMY    . PORTACATH PLACEMENT Right 09/10/2016   Procedure: INSERTION PORT-A-CATH;  Surgeon: Jules Husbands, MD;  Location: [facility] ORS;  Service: General;  Laterality: Right;    SOCIAL HISTORY:   Social History  Substance Use Topics  . Smoking status: Never Smoker  . Smokeless tobacco: Never Used  . Alcohol use No    FAMILY HISTORY:   Family History  Problem Relation Age of Onset  . Parkinson's disease Mother   . Cancer Father     DRUG ALLERGIES:  No Known Allergies  REVIEW OF SYSTEMS:  CONSTITUTIONAL: Positive for fever, fatigue chills and sweats. Positive for weight loss.   EYES: No blurred or double vision.  EARS, NOSE, AND THROAT: No tinnitus or ear pain. No sore throat RESPIRATORY: Occasional cough, positive for shortness of breath, no wheezing or hemoptysis.  CARDIOVASCULAR: No chest pain, orthopnea, edema.  GASTROINTESTINAL: Positive nausea. No vomiting.  positive for diarrhea and crampy abdominal pain. See some blood with wiping. GENITOURINARY: No dysuria, hematuria.  ENDOCRINE: No polyuria, nocturia,  HEMATOLOGY: No anemia, easy bruising or bleeding SKIN: No rash or lesion. MUSCULOSKELETAL: No joint pain or arthritis.   NEUROLOGIC: No tingling, numbness, weakness. Feels woozy PSYCHIATRY: No anxiety or depression.   MEDICATIONS AT HOME:   Prior to Admission medications   Medication Sig Start Date End Date Taking? Authorizing Provider  acetaminophen (TYLENOL) 500 MG tablet Take 1,000 mg by mouth every 6 (six) hours as needed for moderate pain.   Yes [provider]  dexamethasone (DECADRON) 4 MG tablet Take 2 tablets by mouth once a day on the day after chemotherapy and then take 2 tablets two times a day for 2 days. Take with food. 09/04/16  Yes Sindy Guadeloupe, MD  diphenoxylate-atropine (LOMOTIL) 2.5-0.025 MG tablet Take 1 tablet by mouth 4 (four) times daily as needed for diarrhea or loose stools. 09/23/16  Yes Lloyd Huger, MD  hydrochlorothiazide (MICROZIDE) 12.5 MG capsule Take 12.5 mg by mouth daily. 10/07/16  Yes [provider]  lidocaine-prilocaine (EMLA) cream Apply to affected area  once 09/04/16  Yes Sindy Guadeloupe, MD  LORazepam (ATIVAN) 0.5 MG tablet Take 1 tablet (0.5 mg total) by mouth every 6 (six) hours as needed (Nausea or vomiting). Patient taking differently: Take 0.5 mg by mouth every 6 (six) hours as needed. Nausea or vomiting 10/01/16  Yes Sindy Guadeloupe, MD  ondansetron (ZOFRAN) 8 MG tablet Take 1 tablet (8 mg total) by mouth 2 (two) times daily as needed. Start on the third day after chemotherapy. Patient  taking differently: Take 8 mg by mouth 2 (two) times daily as needed for nausea. Start on the third day after chemotherapy. 10/01/16  Yes Sindy Guadeloupe, MD  prochlorperazine (COMPAZINE) 10 MG tablet Take 1 tablet (10 mg total) by mouth every 6 (six) hours as needed (Nausea or vomiting). Patient taking differently: Take 10 mg by mouth every 6 (six) hours as needed for nausea or vomiting.  09/04/16  Yes Sindy Guadeloupe, MD      VITAL SIGNS:  Blood pressure (!) 160/46, pulse 92, temperature 99.1 F (37.3 C), temperature source Oral, resp. rate (!) 37, height 5\' 2"  (1.575 m), weight 89.4 kg (197 lb), SpO2 98 %.  Respirations with me counting 18  PHYSICAL EXAMINATION:  GENERAL:  76 y.o.-year-old patient lying in the bed with no acute distress.  EYES: Pupils equal, round, reactive to light and accommodation. No scleral icterus. Extraocular muscles intact.  HEENT: Head atraumatic, normocephalic. Oropharynx and nasopharynx clear.  NECK:  Supple, no jugular venous distention. No thyroid enlargement, no tenderness.  LUNGS: Decreased breath sounds bilaterally, no wheezing, rales,rhonchi or crepitation. No use of accessory muscles of respiration.  CARDIOVASCULAR: S1, S2 normal. No murmurs, rubs, or gallops.  ABDOMEN: Soft, nontender, nondistended. Bowel sounds present. No organomegaly or mass.  EXTREMITIES: Trace edema, no cyanosis, or clubbing.  NEUROLOGIC: Cranial nerves II through XII are intact. Muscle strength 5/5 in all extremities. Sensation intact. Gait not checked.  PSYCHIATRIC: The patient is alert and oriented x 3.  SKIN: No rash, lesion, or ulcer.   LABORATORY PANEL:   CBC  Recent Labs Lab 10/09/16 1255  WBC 0.3*  HGB 11.1*  HCT 31.9*  PLT 61*   ------------------------------------------------------------------------------------------------------------------  Chemistries   Recent Labs Lab 10/09/16 1255  NA 127*  K 3.7  CL 95*  CO2 22  GLUCOSE 150*  BUN 11  CREATININE  0.53  CALCIUM 8.0*  AST 20  ALT 19  ALKPHOS 136*  BILITOT 1.2   ------------------------------------------------------------------------------------------------------------------  Cardiac Enzymes  Recent Labs Lab 10/09/16 1255  TROPONINI <0.03   ------------------------------------------------------------------------------------------------------------------  RADIOLOGY:  Dg Chest 2 View  Result Date: 10/09/2016 CLINICAL DATA:  Metastatic breast cancer with chemotherapy and fever. EXAM: CHEST  2 VIEW COMPARISON:  Sep 10, 2016 FINDINGS: A right Port-A-Cath is in stable position. No pneumothorax. Cardiomegaly persists. The hila and mediastinum are normal. No pulmonary nodules or masses are seen. No focal infiltrates. No change in the bones. IMPRESSION: No cause for fever identified. Electronically Signed   By: Dorise Bullion III M.D   On: 10/09/2016 13:57     IMPRESSION AND PLAN:   1. Sepsis with Neutropenic fever. In the ER was put on sepsis protocol and given Zosyn and vancomycin in the ER. Patient has leukopenia and tachycardia and fever Since the patient does have a port I will continue vancomycin for right now. I will switch the Zosyn over to Maxipime. Repeat chest x-ray tomorrow. Follow-up blood and urine cultures. Stool studies since the patient having diarrhea. 2.  Stage IV breast cancer metastatic to bone. Patient wishes to be a DO NOT INTUBATE at this time. Oncology consultation. 3. Essential hypertension. Switch hydrochlorothiazide over to Norvasc 4. Hyponatremia could be secondary to hydrochlorothiazide. Stop hydrochlorothiazide. Gentle IV fluid hydration. 5. Start nebulizer treatment and repeat a chest x-ray tomorrow morning    All the records are reviewed and case discussed with ED provider. Management plans discussed with the patient, family and they are in agreement.  CODE STATUS: DO NOT INTUBATE  TOTAL TIME TAKING CARE OF THIS PATIENT: 55 minutes.    Loletha Grayer M.D on 10/09/2016 at 3:08 PM  Between 7am to 6pm - Pager - [phone]  After 6pm call admission pager [phone]  [facility]  [phone]  CC: Primary care physician; Letta Median, MD

## 2016-10-09 NOTE — Progress Notes (Signed)
Pharmacy Antibiotic Note  Victoria Steele is a 76 y.o. female admitted on 10/09/2016 with sepsis with neutropenic fever.  Pharmacy has been consulted for vancomycin and cefepime dosing. Pt received vancomycin 1 g IV x1 at 1334 and Zosyn 3.375 g IV x1 at 1420 in the ED.   Plan: Start 10 h after initial dose Vancomycin 1000 mg IV every 18 hours.  Goal trough 15-20 mcg/mL. Zosyn 3.375g IV q8h (4 hour infusion).   Ke 0.045, half life 15.4 h, Vd 46.1 L Vanc trough before 4th dose of regimen - 7/2 at 0430.   Height: 5\' 2"  (157.5 cm) Weight: 197 lb (89.4 kg) IBW/kg (Calculated) : 50.1  Temp (24hrs), Avg:99.1 F (37.3 C), Min:99.1 F (37.3 C), Max:99.1 F (37.3 C)   Recent Labs Lab 10/09/16 1255 10/09/16 1257  WBC 0.3*  --   CREATININE 0.53  --   LATICACIDVEN  --  1.4    Estimated Creatinine Clearance: 65.1 mL/min (by C-G formula based on SCr of 0.53 mg/dL).    No Known Allergies  Antimicrobials this admission: Zosyn x1 6/29 Vancomycin  6/29 >> Cefepime 6/29 >>  Dose adjustments this admission:   Microbiology results: UCx BCx x2 C diff GI panel  Thank you for allowing pharmacy to be a part of this patient's care.  Rocky Morel 10/09/2016 4:10 PM

## 2016-10-09 NOTE — Progress Notes (Signed)
Hematology/Oncology Consult note [facility] [phone] [phone]  Patient Care Team: Letta Median, MD as PCP - General (Family Medicine)   Name of the patient: Victoria Steele  [id number]  06/20/1940    Reason for consult: h/o breast cancer. Neutropenic fever   Requesting physician: Dr. Vianne Bulls  Date of visit: 10/09/2016    History of presenting illness- Patient is a 76 yr old female with newly diagnosed left breast with bone mets. She has a large breast mass involving the skin and therefore getting dose dense AC chemotherapy as an outpatient. Cycle 2 was on 10/01/16 and she received neulasta as well.   She presented to the ER with symptoms of fever, chills, generally not feeling well and frequent bouts of diarrhea over last few days.   ECOG PS- 1  Pain scale- 0   Review of systems- Review of Systems  Constitutional: Positive for chills, fever and malaise/fatigue. Negative for weight loss.  HENT: Negative for congestion, ear discharge and nosebleeds.   Eyes: Negative for blurred vision.  Respiratory: Negative for cough, hemoptysis, sputum production, shortness of breath and wheezing.   Cardiovascular: Negative for chest pain, palpitations, orthopnea and claudication.  Gastrointestinal: Positive for diarrhea. Negative for abdominal pain, blood in stool, constipation, heartburn, melena, nausea and vomiting.  Genitourinary: Negative for dysuria, flank pain, frequency, hematuria and urgency.  Musculoskeletal: Negative for back pain, joint pain and myalgias.  Skin: Negative for rash.  Neurological: Negative for dizziness, tingling, focal weakness, seizures, weakness and headaches.  Endo/Heme/Allergies: Does not bruise/bleed easily.  Psychiatric/Behavioral: Negative for depression and suicidal ideas. The patient does not have insomnia.     No Known Allergies  Patient Active Problem List   Diagnosis Date Noted  . Neutropenic  fever ([place]) 10/09/2016  . Cancer ([place])   . Goals of care, counseling/discussion 09/04/2016  . Breast cancer in female [facility]) 09/04/2016  . Bone metastases ([place]) 09/04/2016     Past Medical History:  Diagnosis Date  . Cancer ([place])   . Hypertension   . Port catheter in place 09/14/2016   Placed 09/10/2016 RT chest.     Past Surgical History:  Procedure Laterality Date  . ABDOMINAL HYSTERECTOMY    . APPENDECTOMY    . PORTACATH PLACEMENT Right 09/10/2016   Procedure: INSERTION PORT-A-CATH;  Surgeon: Jules Husbands, MD;  Location: [facility] ORS;  Service: General;  Laterality: Right;    Social History   Social History  . Marital status: Married    Spouse name: N/A  . Number of children: N/A  . Years of education: N/A   Occupational History  . Not on file.   Social History Main Topics  . Smoking status: Never Smoker  . Smokeless tobacco: Never Used  . Alcohol use No  . Drug use: No  . Sexual activity: No   Other Topics Concern  . Not on file   Social History Narrative  . No narrative on file     Family History  Problem Relation Age of Onset  . Parkinson's disease Mother   . Cancer Father      Current Facility-Administered Medications:  .  0.9 %  sodium chloride infusion, , Intravenous, Continuous, Wieting, Richard, MD, Last Rate: 50 mL/hr at 10/09/16 1828 .  acetaminophen (TYLENOL) tablet 650 mg, 650 mg, Oral, Q6H PRN, 650 mg at 10/09/16 1826 **OR** acetaminophen (TYLENOL) suppository 650 mg, 650 mg, Rectal, Q6H PRN, Wieting, Richard, MD .  amLODipine (NORVASC) tablet 5 mg, 5 mg,  Oral, Daily, Loletha Grayer, MD, 5 mg at 10/09/16 1713 .  ceFEPIme (MAXIPIME) 2 g in dextrose 5 % 50 mL IVPB, 2 g, Intravenous, Q8H, Wieting, Richard, MD, Last Rate: 100 mL/hr at 10/09/16 2056, 2 g at 10/09/16 2056 .  enoxaparin (LOVENOX) injection 40 mg, 40 mg, Subcutaneous, Q24H, Wieting, Richard, MD, 40 mg at 10/09/16 2056 .  ipratropium-albuterol (DUONEB) 0.5-2.5 (3) MG/3ML nebulizer  solution 3 mL, 3 mL, Nebulization, Q6H, Wieting, Richard, MD, 3 mL at 10/09/16 2029 .  LORazepam (ATIVAN) tablet 0.5 mg, 0.5 mg, Oral, Q6H PRN, Leslye Peer, Richard, MD .  ondansetron (ZOFRAN) tablet 4 mg, 4 mg, Oral, Q6H PRN **OR** ondansetron (ZOFRAN) injection 4 mg, 4 mg, Intravenous, Q6H PRN, Wieting, Richard, MD .  prochlorperazine (COMPAZINE) tablet 10 mg, 10 mg, Oral, Q6H PRN, Wieting, Richard, MD .  vancomycin (VANCOCIN) IVPB 1000 mg/200 mL premix, 1,000 mg, Intravenous, Q18H, Loletha Grayer, MD   Physical exam:  Vitals:   10/09/16 1631 10/09/16 1758 10/09/16 1923 10/09/16 2000  BP: (!) 175/75 (!) 158/52  (!) 136/47  Pulse: 88 91  81  Resp: (!) 22   (!) 21  Temp:  (!) 101.5 F (38.6 C) 99.6 F (37.6 C) 99.2 F (37.3 C)  TempSrc:  Oral Axillary Axillary  SpO2: 100% 98%  95%  Weight:      Height:       Physical Exam  Constitutional: She is oriented to person, place, and time.  Appears fatigued. In no acute distress  HENT:  Head: Normocephalic and atraumatic.  Mouth/Throat: Oropharynx is clear and moist.  Eyes: EOM are normal. Pupils are equal, round, and reactive to light.  Neck: Normal range of motion.  Cardiovascular: Normal rate, regular rhythm and normal heart sounds.   Pulmonary/Chest: Effort normal and breath sounds normal.  Abdominal: Soft. Bowel sounds are normal.  Neurological: She is alert and oriented to person, place, and time.  Skin: Skin is warm and dry.       CMP Latest Ref Rng & Units 10/09/2016  Glucose 65 - 99 mg/dL 150(H)  BUN 6 - 20 mg/dL 11  Creatinine 0.44 - 1.00 mg/dL 0.53  Sodium 135 - 145 mmol/L 127(L)  Potassium 3.5 - 5.1 mmol/L 3.7  Chloride 101 - 111 mmol/L 95(L)  CO2 22 - 32 mmol/L 22  Calcium 8.9 - 10.3 mg/dL 8.0(L)  Total Protein 6.5 - 8.1 g/dL 6.5  Total Bilirubin 0.3 - 1.2 mg/dL 1.2  Alkaline Phos 38 - 126 U/L 136(H)  AST 15 - 41 U/L 20  ALT 14 - 54 U/L 19   CBC Latest Ref Rng & Units 10/09/2016  WBC 3.6 - 11.0 K/uL 0.3(LL)    Hemoglobin 12.0 - 16.0 g/dL 11.1(L)  Hematocrit 35.0 - 47.0 % 31.9(L)  Platelets 150 - 440 K/uL 61(L)    @IMAGES @  Dg Chest 2 View  Result Date: 10/09/2016 CLINICAL DATA:  Metastatic breast cancer with chemotherapy and fever. EXAM: CHEST  2 VIEW COMPARISON:  Sep 10, 2016 FINDINGS: A right Port-A-Cath is in stable position. No pneumothorax. Cardiomegaly persists. The hila and mediastinum are normal. No pulmonary nodules or masses are seen. No focal infiltrates. No change in the bones. IMPRESSION: No cause for fever identified. Electronically Signed   By: Dorise Bullion III M.D   On: 10/09/2016 13:57   Nm Cardiac Muga Rest  Result Date: 09/14/2016 CLINICAL DATA:  LEFT breast cancer, pre cardiotoxic chemotherapy EXAM: NUCLEAR MEDICINE CARDIAC BLOOD POOL IMAGING (MUGA) TECHNIQUE: Cardiac multi-gated acquisition was  performed at rest following intravenous injection of Tc-39m labeled red blood cells. RADIOPHARMACEUTICALS:  21.445 mCi Tc-25m pertechnetate in-vitro labeled autologous red blood cells IV COMPARISON:  None FINDINGS: Calculated LEFT ventricular ejection fraction is 71% which is within the normal range. Study was obtained at at a cardiac rate of 78 beats per minute. Patient was rhythmic during imaging. Cine analysis of the LEFT ventricle in 3 projections demonstrates normal LEFT ventricular wall motion. IMPRESSION: Normal LEFT ventricular ejection fraction of 71%. Normal LV wall motion. Electronically Signed   By: Lavonia Dana M.D.   On: 09/14/2016 15:42   Nm Pet Image Initial (pi) Skull Base To Thigh  Result Date: 09/11/2016 CLINICAL DATA:  Initial treatment strategy for metastatic left breast cancer. EXAM: NUCLEAR MEDICINE PET SKULL BASE TO THIGH TECHNIQUE: 12.5 mCi F-18 FDG was injected intravenously. Full-ring PET imaging was performed from the skull base to thigh after the radiotracer. CT data was obtained and used for attenuation correction and anatomic localization. FASTING BLOOD GLUCOSE:   Value: 122 mg/dl COMPARISON:  Multiple exams, including CT scan from 09/02/2016 FINDINGS: NECK No hypermetabolic lymph nodes in the neck. CHEST Right Port-A-Cath tip:  SVC. Retracted and possibly inverted left nipple with associated skin thickening and faint scan hypermetabolic activity as well as retroareolar indistinctly marginated accentuated tissues with maximum SUV 3.9. In contrast a similar region on the right has a maximum SUV of 1.4. There is stranding around a faintly metabolic left axillary node measuring about 7 mm in short axis diameter, maximum SUV of this node shown on image 86/4 is 1.4. Mild cardiomegaly. ABDOMEN/PELVIS No abnormal hypermetabolic activity within the liver, pancreas, adrenal glands, or spleen. No hypermetabolic lymph nodes in the abdomen or pelvis. Aortoiliac atherosclerotic vascular disease. Descending and sigmoid colon diverticulosis. SKELETON Diffuse osseous sclerotic metastatic disease especially involving the axial skeleton but with some involvement of the appendicular skeleton. Low-grade associated hypermetabolic activity. A representative portion of the left upper sacrum has a maximum SUV of 5.4. Overall the abnormal bony uptake is low-grade in generalized in the regions of diffuse osseous metastatic disease, rather than focal. IMPRESSION: 1. Low-grade but abnormal hypermetabolic activity associated with the cutaneous and sub areolar glandular tissues of the left breast, compatible with malignancy, representative SUV 3.9 compared to contralateral normal side 1.4. 2. Diffuse sclerotic osseous metastatic disease with low-grade hypermetabolic activity, a representative SUV along the left sacrum 5.4. 3. Other imaging findings of potential clinical significance: Aortic Atherosclerosis (ICD10-I70.0). Descending and sigmoid colon diverticulosis. Electronically Signed   By: Van Clines M.D.   On: 09/11/2016 11:48   Dg Chest Port 1 View  Result Date: 09/10/2016 CLINICAL DATA:   Status post port insertion. EXAM: PORTABLE CHEST 1 VIEW COMPARISON:  Fluoroscopy from earlier today FINDINGS: Right IJ porta catheter with tip at the SVC level. The venotomy is somewhat high in the neck such that the upper catheter was not covered. No visualized kink. No pneumothorax. Cardiomegaly. Mild atelectasis. IMPRESSION: 1. New porta catheter with tip at the SVC level. 2. No evidence of pneumothorax. 3. Small portion of the catheter is not seen in the neck. Electronically Signed   By: Monte Fantasia M.D.   On: 09/10/2016 13:55   Dg C-arm 1-60 Min-no Report  Result Date: 09/10/2016 Fluoroscopy was utilized by the requesting physician.  No radiographic interpretation.    Assessment and plan- Patient is a 76 y.o. female with h/o stage Iv left breast cancer with bone mets s/p 2 cycles of dose dense AC admitted  for neutropenic fever  1. CXR and UA was normal blood cultures pending. Recommend broad spectrum IV antibiotics and IV fluids given hyponatremia and hypochloremia and clinically she appears a little dehydrated as well. No clear source of infection as yet. Agree with checking stool for C diff. If she remains afebrile for 24 hours after fluids and IV antibiotics she may be discharged home on PO antibiotics if blood cultures are negative  2. Pancytopenia- secondary to chemotherapy. Continue to monitor. No need for transfusion unless hb <7 or platelets <10. She has already received neulasta on 6/21 and therefore does not need neupogen at this time. Her wbc should recover soon.   She will see me as an outpatient next week   Visit Diagnosis 1. Neutropenic fever ([place])     Dr. Randa Evens, MD, MPH [facility] Pager- [phone] 10/09/2016

## 2016-10-09 NOTE — ED Notes (Signed)
No episodes of diarrhea since pt has been in ED. Pt aware we need a sample if pt has any more episodes of diarrhea.

## 2016-10-09 NOTE — ED Notes (Signed)
Code Sepsis called to [facility] 

## 2016-10-09 NOTE — ED Notes (Signed)
Pt unable to give urine sample at this time. Will continue to try.

## 2016-10-10 ENCOUNTER — Other Ambulatory Visit: Payer: Self-pay | Admitting: Internal Medicine

## 2016-10-10 ENCOUNTER — Inpatient Hospital Stay: Payer: Medicare Other

## 2016-10-10 DIAGNOSIS — R197 Diarrhea, unspecified: Secondary | ICD-10-CM

## 2016-10-10 LAB — C DIFFICILE QUICK SCREEN W PCR REFLEX
C DIFFICILE (CDIFF) TOXIN: NEGATIVE
C DIFFICLE (CDIFF) ANTIGEN: NEGATIVE
C Diff interpretation: NOT DETECTED

## 2016-10-10 LAB — BLOOD CULTURE ID PANEL (REFLEXED)
Acinetobacter baumannii: NOT DETECTED
CANDIDA ALBICANS: NOT DETECTED
CANDIDA GLABRATA: NOT DETECTED
CANDIDA KRUSEI: NOT DETECTED
CANDIDA PARAPSILOSIS: NOT DETECTED
CANDIDA TROPICALIS: NOT DETECTED
ENTEROBACTER CLOACAE COMPLEX: NOT DETECTED
ENTEROBACTERIACEAE SPECIES: NOT DETECTED
ESCHERICHIA COLI: NOT DETECTED
Enterococcus species: NOT DETECTED
Haemophilus influenzae: NOT DETECTED
KLEBSIELLA OXYTOCA: NOT DETECTED
Klebsiella pneumoniae: NOT DETECTED
Listeria monocytogenes: NOT DETECTED
Methicillin resistance: NOT DETECTED
Neisseria meningitidis: NOT DETECTED
PSEUDOMONAS AERUGINOSA: NOT DETECTED
Proteus species: NOT DETECTED
STREPTOCOCCUS PNEUMONIAE: NOT DETECTED
STREPTOCOCCUS PYOGENES: NOT DETECTED
Serratia marcescens: NOT DETECTED
Staphylococcus aureus (BCID): NOT DETECTED
Staphylococcus species: DETECTED — AB
Streptococcus agalactiae: NOT DETECTED
Streptococcus species: NOT DETECTED

## 2016-10-10 LAB — CBC
HEMATOCRIT: 27 % — AB (ref 35.0–47.0)
HEMOGLOBIN: 9.5 g/dL — AB (ref 12.0–16.0)
MCH: 31 pg (ref 26.0–34.0)
MCHC: 35.3 g/dL (ref 32.0–36.0)
MCV: 87.8 fL (ref 80.0–100.0)
Platelets: 57 10*3/uL — ABNORMAL LOW (ref 150–440)
RBC: 3.07 MIL/uL — AB (ref 3.80–5.20)
RDW: 14.5 % (ref 11.5–14.5)
WBC: 0.6 10*3/uL — AB (ref 3.6–11.0)

## 2016-10-10 LAB — BASIC METABOLIC PANEL
ANION GAP: 5 (ref 5–15)
BUN: 9 mg/dL (ref 6–20)
CHLORIDE: 102 mmol/L (ref 101–111)
CO2: 25 mmol/L (ref 22–32)
Calcium: 7.8 mg/dL — ABNORMAL LOW (ref 8.9–10.3)
Creatinine, Ser: 0.66 mg/dL (ref 0.44–1.00)
GFR calc Af Amer: >60 mL/min (ref >60)
GFR calc non Af Amer: >60 mL/min (ref >60)
Glucose, Bld: 113 mg/dL — ABNORMAL HIGH (ref 65–99)
POTASSIUM: 3 mmol/L — AB (ref 3.5–5.1)
SODIUM: 132 mmol/L — AB (ref 135–145)

## 2016-10-10 LAB — GASTROINTESTINAL PANEL BY PCR, STOOL (REPLACES STOOL CULTURE)

## 2016-10-10 LAB — POTASSIUM: Potassium: 3.5 mmol/L (ref 3.5–5.1)

## 2016-10-10 MED ORDER — POTASSIUM CHLORIDE 10 MEQ/100ML IV SOLN
10.0000 meq | INTRAVENOUS | Status: AC
  Administered 2016-10-10 (×2): 10 meq via INTRAVENOUS
  Filled 2016-10-10 (×2): qty 100

## 2016-10-10 MED ORDER — IPRATROPIUM-ALBUTEROL 0.5-2.5 (3) MG/3ML IN SOLN: 3.0000 mL | Freq: Four times a day (QID) | RESPIRATORY_TRACT | Status: DC | PRN

## 2016-10-10 MED ORDER — POTASSIUM CHLORIDE 10 MEQ/100ML IV SOLN
10.0000 meq | INTRAVENOUS | Status: AC
  Administered 2016-10-10 (×2): 10 meq via INTRAVENOUS
  Filled 2016-10-10 (×2): qty 100

## 2016-10-10 NOTE — Plan of Care (Signed)
Problem: Fluid Volume: Goal: Ability to maintain a balanced intake and output will improve Outcome: Progressing Pt denies n/v. IVF infusing.  Problem: Nutrition: Goal: Adequate nutrition will be maintained Outcome: Progressing Pt tolerates meals.   Problem: Bowel/Gastric: Goal: Will not experience complications related to bowel motility Outcome: Progressing Pt had2xloose stools during the shift. C.diff and GI panel negative. Enteric precautions d/c.

## 2016-10-10 NOTE — Progress Notes (Signed)
PHARMACY - PHYSICIAN COMMUNICATION CRITICAL VALUE ALERT - BLOOD CULTURE IDENTIFICATION (BCID)  Results for orders placed or performed during the hospital encounter of 10/09/16  Blood Culture ID Panel (Reflexed) (Collected: 10/09/2016 12:52 PM)  Result Value Ref Range   Enterococcus species NOT DETECTED NOT DETECTED   Listeria monocytogenes NOT DETECTED NOT DETECTED   Staphylococcus species DETECTED (A) NOT DETECTED   Staphylococcus aureus NOT DETECTED NOT DETECTED   Methicillin resistance NOT DETECTED NOT DETECTED   Streptococcus species NOT DETECTED NOT DETECTED   Streptococcus agalactiae NOT DETECTED NOT DETECTED   Streptococcus pneumoniae NOT DETECTED NOT DETECTED   Streptococcus pyogenes NOT DETECTED NOT DETECTED   Acinetobacter baumannii NOT DETECTED NOT DETECTED   Enterobacteriaceae species NOT DETECTED NOT DETECTED   Enterobacter cloacae complex NOT DETECTED NOT DETECTED   Escherichia coli NOT DETECTED NOT DETECTED   Klebsiella oxytoca NOT DETECTED NOT DETECTED   Klebsiella pneumoniae NOT DETECTED NOT DETECTED   Proteus species NOT DETECTED NOT DETECTED   Serratia marcescens NOT DETECTED NOT DETECTED   Haemophilus influenzae NOT DETECTED NOT DETECTED   Neisseria meningitidis NOT DETECTED NOT DETECTED   Pseudomonas aeruginosa NOT DETECTED NOT DETECTED   Candida albicans NOT DETECTED NOT DETECTED   Candida glabrata NOT DETECTED NOT DETECTED   Candida krusei NOT DETECTED NOT DETECTED   Candida parapsilosis NOT DETECTED NOT DETECTED   Candida tropicalis NOT DETECTED NOT DETECTED    Name of physician (or Provider) Contacted: Konidena  Changes to prescribed antibiotics required: No, will continue pt on Vanc and Cefepime.   Farrah Skoda D 10/10/2016  12:38 PM

## 2016-10-10 NOTE — Progress Notes (Signed)
Victoria Steele   DOB:[birth year]   TT#:[id number]    Subjective: Neutropenic fever.  Patient has had not any fevers. She appears nontoxic complaints of abdominal discomfort and intermittent diarrhea. No blood in stools. No nausea no vomiting. No chills. She feels much better since yesterday.  Objective:  Vitals:   10/10/16 0445 10/10/16 0850  BP: (!) 144/39 (!) 134/40  Pulse: 86 86  Resp: 20 18  Temp: 99.5 F (37.5 C) 97.9 F (36.6 C)     Intake/Output Summary (Last 24 hours) at 10/10/16 1227 Last data filed at 10/10/16 0400  Gross per 24 hour  Intake           726.67 ml  Output                0 ml  Net           726.67 ml    GENERAL Alert, no distress and comfortable. On RA. accompanied by her husband.  EYES: no pallor or icterus OROPHARYNX: no thrush or ulceration. NECK: supple, no masses felt LYMPH:  no palpable lymphadenopathy in the cervical, axillary or inguinal regions LUNGS: decreased breath sounds to auscultation at bases and  No wheeze or crackles HEART/CVS: regular rate & rhythm and no murmurs; No lower extremity edema ABDOMEN: abdomen soft, tender  on deep palpation. and normal bowel sounds Musculoskeletal:no cyanosis of digits and no clubbing  PSYCH: alert & oriented x 3 with fluent speech NEURO: no focal motor/sensory deficits SKIN:  no rashes or significant lesions Left BREAST exam [in the presence of family]- integration of the left nipple / puckering of the skin 3-4 cm mass felt approximately 10:00 position. As per patient significant improvement of erythema   Labs:  Lab Results  Component Value Date   WBC 0.6 (LL) 10/10/2016   HGB 9.5 (L) 10/10/2016   HCT 27.0 (L) 10/10/2016   MCV 87.8 10/10/2016   PLT 57 (L) 10/10/2016   NEUTROABS 0.2 (L) 10/09/2016    Lab Results  Component Value Date   NA 132 (L) 10/10/2016   K 3.0 (L) 10/10/2016   CL 102 10/10/2016   CO2 25 10/10/2016    Studies:  Dg Chest 2 View  Result Date: 10/10/2016 CLINICAL DATA:   Metastatic breast cancer.  Neutropenia.  Fever. EXAM: CHEST  2 VIEW COMPARISON:  10/09/2016.  09/10/2016.  08/28/2016. FINDINGS: Artifact overlies chest. Heart size is normal. Power port on the right inserted from an internal jugular approach has its tip in the SVC 3 cm above the right atrium. The right lung is clear. There is abnormal density at the left cardiophrenic angle. This could be due to volume loss or pneumonia. In this setting, lingular pneumonia is the concern. IMPRESSION: Worsening density at the left cardiophrenic angle worrisome for lingular pneumonia. Electronically Signed   By: Nelson Chimes M.D.   On: 10/10/2016 10:09   Dg Chest 2 View  Result Date: 10/09/2016 CLINICAL DATA:  Metastatic breast cancer with chemotherapy and fever. EXAM: CHEST  2 VIEW COMPARISON:  Sep 10, 2016 FINDINGS: A right Port-A-Cath is in stable position. No pneumothorax. Cardiomegaly persists. The hila and mediastinum are normal. No pulmonary nodules or masses are seen. No focal infiltrates. No change in the bones. IMPRESSION: No cause for fever identified. Electronically Signed   By: Dorise Bullion III M.D   On: 10/09/2016 13:57    Assessment & Plan:   # 76 year old female patient with metastatic breast cancer on chemotherapy currently admitted to the  hospital for neutropenic fever  # Metastatic breast cancer ER/PR positive HER-2/neu negative- on dose dense Adriamycin and Cytoxan status post cycle #2 day 9 [s/p neulasta]. Clinical response noted. Discussed with patient and husband that cycle #3 which is due this coming week likely be pushed back- based on her clinical status. Also discussed multiple other non-chemotherapy treatment options available for advanced breast cancer; although stage IV cancer is still incurable.   # Neutropenic fever-Blood culture positive for coag negative staph question contaminant. Chest x-ray- left lingular infiltrate. Improved; however continued neutropenia; status post Neulasta. No  role for Neupogen. Continue broad-spectrum antibiotics.   # Pancytopenia secondary to chemotherapy- hemoglobin 9.5 platelets- 57. No role for transfusions. Monitor for now.  # Diarrhea- improving secondary to chemotherapy. Negative for C. difficile stool PCR pending. Clinically not typhlitis.   # above plan discussed with Dr. Vianne Bulls. Also discussed with patient/ and her husband.   Cammie Sickle, MD 10/10/2016  12:27 PM

## 2016-10-10 NOTE — Progress Notes (Signed)
[facility] [place] NAME: Victoria Steele    MR#:  [id number]  DATE OF BIRTH:  June 12, 1940  SUBJECTIVE: admitted  For neutropenic fever, found to have pneumonia by chest x-ray today morning. Patient is afebrile. No shortness of breath or cough. Blood cultures have been negative so far. Urine cultures also negative.   CHIEF COMPLAINT:   Chief Complaint  Patient presents with  . Dizziness  . Fever    REVIEW OF SYSTEMS:    Review of Systems  Constitutional: Negative for chills and fever.  HENT: Negative for hearing loss.   Eyes: Negative for blurred vision, double vision and photophobia.  Respiratory: Negative for cough, hemoptysis and shortness of breath.   Cardiovascular: Negative for palpitations, orthopnea and leg swelling.  Gastrointestinal: Negative for abdominal pain, diarrhea and vomiting.  Genitourinary: Negative for dysuria and urgency.  Musculoskeletal: Negative for myalgias and neck pain.  Skin: Negative for rash.  Neurological: Negative for dizziness, focal weakness, seizures, weakness and headaches.  Psychiatric/Behavioral: Negative for memory loss. The patient does not have insomnia.     Nutrition:  Tolerating Diet: Tolerating PT:      DRUG ALLERGIES:  No Known Allergies  VITALS:  Blood pressure (!) 134/40, pulse 86, temperature 97.9 F (36.6 C), temperature source Oral, resp. rate 18, height 5\' 2"  (1.575 m), weight 89.4 kg (197 lb), SpO2 97 %.  PHYSICAL EXAMINATION:   Physical Exam  GENERAL:  76 y.o.-year-old patient lying in the bed with no acute distress.  EYES: Pupils equal, round, reactive to light and accommodation. No scleral icterus. Extraocular muscles intact.  HEENT: Head atraumatic, normocephalic. Oropharynx and nasopharynx clear.  NECK:  Supple, no jugular venous distention. No thyroid enlargement, no tenderness.  LUNGS: Normal breath sounds bilaterally, no wheezing, rales,rhonchi or  crepitation. No use of accessory muscles of respiration.  CARDIOVASCULAR: S1, S2 normal. No murmurs, rubs, or gallops.  ABDOMEN: Soft, nontender, nondistended. Bowel sounds present. No organomegaly or mass.  EXTREMITIES: No pedal edema, cyanosis, or clubbing.  NEUROLOGIC: Cranial nerves II through XII are intact. Muscle strength 5/5 in all extremities. Sensation intact. Gait not checked.  PSYCHIATRIC: The patient is alert and oriented x 3.  SKIN: No obvious rash, lesion, or ulcer.    LABORATORY PANEL:   CBC  Recent Labs Lab 10/10/16 0329  WBC 0.6*  HGB 9.5*  HCT 27.0*  PLT 57*   ------------------------------------------------------------------------------------------------------------------  Chemistries   Recent Labs Lab 10/09/16 1255 10/10/16 0329  NA 127* 132*  K 3.7 3.0*  CL 95* 102  CO2 22 25  GLUCOSE 150* 113*  BUN 11 9  CREATININE 0.53 0.66  CALCIUM 8.0* 7.8*  AST 20  --   ALT 19  --   ALKPHOS 136*  --   BILITOT 1.2  --    ------------------------------------------------------------------------------------------------------------------  Cardiac Enzymes  Recent Labs Lab 10/09/16 1255  TROPONINI <0.03   ------------------------------------------------------------------------------------------------------------------  RADIOLOGY:  Dg Chest 2 View  Result Date: 10/10/2016 CLINICAL DATA:  Metastatic breast cancer.  Neutropenia.  Fever. EXAM: CHEST  2 VIEW COMPARISON:  10/09/2016.  09/10/2016.  08/28/2016. FINDINGS: Artifact overlies chest. Heart size is normal. Power port on the right inserted from an internal jugular approach has its tip in the SVC 3 cm above the right atrium. The right lung is clear. There is abnormal density at the left cardiophrenic angle. This could be due to volume loss or pneumonia. In this setting, lingular pneumonia is the concern. IMPRESSION: Worsening  density at the left cardiophrenic angle worrisome for lingular pneumonia.  Electronically Signed   By: Nelson Chimes M.D.   On: 10/10/2016 10:09   Dg Chest 2 View  Result Date: 10/09/2016 CLINICAL DATA:  Metastatic breast cancer with chemotherapy and fever. EXAM: CHEST  2 VIEW COMPARISON:  Sep 10, 2016 FINDINGS: A right Port-A-Cath is in stable position. No pneumothorax. Cardiomegaly persists. The hila and mediastinum are normal. No pulmonary nodules or masses are seen. No focal infiltrates. No change in the bones. IMPRESSION: No cause for fever identified. Electronically Signed   By: Dorise Bullion III M.D   On: 10/09/2016 13:57     ASSESSMENT AND PLAN:   Active Problems:   Neutropenic fever ([place])  #1. neutropenic fever. Likely due to left lingular  pneumonia. Continue vancomycin, cefepime, follow blood cultures. Patient is been afebrile. Continue Neupogen for neutropenia. Seen by oncology. Continue treatment neutropenic precautions. Patient did have diarrhea before he she came but no further diarrhea. Discontinue enteral precautions if patient cannot provide any more stoolsample by today evening. #2. Hypokalemia: Replace the potassium, recheck potassium in the morning. #3. metastatic breast cancer. Follows up with oncology, seen by Dr. Randa Evens yesterday.  D/w RN and pt.    All the records are reviewed and case discussed with Care Management/Social Workerr. Management plans discussed with the patient, family and they are in agreement.  CODE STATUS: full  TOTAL TIME TAKING CARE OF THIS PATIENT:35 minutes.   POSSIBLE D/C IN 1-2DAYS, DEPENDING ON CLINICAL CONDITION.   Epifanio Lesches M.D on 10/10/2016 at 10:17 AM  Between 7am to 6pm - Pager - [phone]  After 6pm go to [email or web address] - password [email or web address] [facility]  Office  [phone]  CC: Primary care physician; Letta Median, MD

## 2016-10-11 LAB — CBC WITH DIFFERENTIAL/PLATELET
BAND NEUTROPHILS: 11 %
BASOS PCT: 2 %
BLASTS: 0 %
Basophils Absolute: 0.1 10*3/uL (ref 0–0.1)
EOS ABS: 0 10*3/uL (ref 0–0.7)
Eosinophils Relative: 0 %
HEMATOCRIT: 29.5 % — AB (ref 35.0–47.0)
Hemoglobin: 10.1 g/dL — ABNORMAL LOW (ref 12.0–16.0)
Lymphocytes Relative: 7 %
Lymphs Abs: 0.3 10*3/uL — ABNORMAL LOW (ref 1.0–3.6)
MCH: 29.6 pg (ref 26.0–34.0)
MCHC: 34.2 g/dL (ref 32.0–36.0)
MCV: 86.8 fL (ref 80.0–100.0)
METAMYELOCYTES PCT: 2 %
MONO ABS: 0.2 10*3/uL (ref 0.2–0.9)
MONOS PCT: 6 %
Myelocytes: 0 %
NEUTROS ABS: 3.4 10*3/uL (ref 1.4–6.5)
Neutrophils Relative %: 72 %
Other: 0 %
Platelets: 84 10*3/uL — ABNORMAL LOW (ref 150–440)
Promyelocytes Absolute: 0 %
RBC: 3.4 MIL/uL — ABNORMAL LOW (ref 3.80–5.20)
RDW: 14.5 % (ref 11.5–14.5)
WBC: 4 10*3/uL (ref 3.6–11.0)
nRBC: 0 /100 WBC

## 2016-10-11 LAB — URINE CULTURE

## 2016-10-11 MED ORDER — LEVOFLOXACIN 750 MG PO TABS: 750.0000 mg | ORAL_TABLET | Freq: Every day | ORAL | 0 refills | Status: AC

## 2016-10-11 MED ORDER — AMLODIPINE BESYLATE 5 MG PO TABS: 5.0000 mg | ORAL_TABLET | Freq: Every day | ORAL | 0 refills | Status: DC

## 2016-10-11 NOTE — Progress Notes (Signed)
Victoria Steele   DOB:Nov 13, 1940   AC#:[id number]    Subjective: Neutropenic fever.  Patient has had not any fevers. He continues to have mild diarrhea. Had 3 loose stools yesterday. This morning she had one loose stool. Otherwise no abdominal cramping. No blood in stools. No nausea no vomiting. No chills. She has been ambulating.  ROS: No headaches. No shortness of breath or chest pain   Objective:  Vitals:   10/11/16 0805 10/11/16 1300  BP: (!) 153/67 (!) 165/54  Pulse: 73 78  Resp: 18   Temp: 97.8 F (36.6 C) 98.5 F (36.9 C)     Intake/Output Summary (Last 24 hours) at 10/11/16 1304 Last data filed at 10/11/16 0800  Gross per 24 hour  Intake          1939.17 ml  Output              350 ml  Net          1589.17 ml    GENERAL Alert, no distress and comfortable. On RA. accompanied by her husband.  EYES: no pallor or icterus OROPHARYNX: no thrush or ulceration. NECK: supple, no masses felt LYMPH:  no palpable lymphadenopathy in the cervical, axillary or inguinal regions LUNGS: decreased breath sounds to auscultation at bases and  No wheeze or crackles HEART/CVS: regular rate & rhythm and no murmurs; No lower extremity edema ABDOMEN: abdomen soft, tender  on deep palpation. and normal bowel sounds Musculoskeletal:no cyanosis of digits and no clubbing  PSYCH: alert & oriented x 3 with fluent speech NEURO: no focal motor/sensory deficits SKIN:  no rashes or significant lesions    Labs:  Lab Results  Component Value Date   WBC 4.0 10/11/2016   HGB 10.1 (L) 10/11/2016   HCT 29.5 (L) 10/11/2016   MCV 86.8 10/11/2016   PLT 84 (L) 10/11/2016   NEUTROABS PENDING 10/11/2016    Lab Results  Component Value Date   NA 132 (L) 10/10/2016   K 3.5 10/10/2016   CL 102 10/10/2016   CO2 25 10/10/2016    Studies:  Dg Chest 2 View  Result Date: 10/10/2016 CLINICAL DATA:  Metastatic breast cancer.  Neutropenia.  Fever. EXAM: CHEST  2 VIEW COMPARISON:  10/09/2016.  09/10/2016.   08/28/2016. FINDINGS: Artifact overlies chest. Heart size is normal. Power port on the right inserted from an internal jugular approach has its tip in the SVC 3 cm above the right atrium. The right lung is clear. There is abnormal density at the left cardiophrenic angle. This could be due to volume loss or pneumonia. In this setting, lingular pneumonia is the concern. IMPRESSION: Worsening density at the left cardiophrenic angle worrisome for lingular pneumonia. Electronically Signed   By: Nelson Chimes M.D.   On: 10/10/2016 10:09   Dg Chest 2 View  Result Date: 10/09/2016 CLINICAL DATA:  Metastatic breast cancer with chemotherapy and fever. EXAM: CHEST  2 VIEW COMPARISON:  Sep 10, 2016 FINDINGS: A right Port-A-Cath is in stable position. No pneumothorax. Cardiomegaly persists. The hila and mediastinum are normal. No pulmonary nodules or masses are seen. No focal infiltrates. No change in the bones. IMPRESSION: No cause for fever identified. 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 Clinical response noted. Defer further treatment options to pt's primary oncologist, Dr.Rao.    # Neutropenic fever-Blood culture positive for coag negative staph question contaminant. Chest x-ray- left lingular infiltrate. Improved; today white count 4. Patient will be discharged home on Levaquin for 7 days.  # Pancytopenia secondary to chemotherapy- improving white count 4 hemoglobin 10 platelets 84.  # Diarrhea- improving secondary to chemotherapy. Negative for C. difficile stool PCR-negative. Likely from chemotherapy. Recommend Bratt diet; probiotics; lomotil.    # discussed with patient/  and her husband- Jennet Maduro on going home. Patient will keep the appointment with Dr. Janese Banks as planned this week.  Cammie Sickle, MD 10/11/2016  1:04 PM

## 2016-10-11 NOTE — Progress Notes (Signed)
[facility] [place] NAME: Victoria Steele    MR#:  [id number]  DATE OF BIRTH:  05-26-1940  SUBJECTIVE: admitted  For neutropenic fever, found to have pneumonia by chest x-ray . Patient is afebrile. Patient is doing well,    CHIEF COMPLAINT:   Chief Complaint  Patient presents with  . Dizziness  . Fever    REVIEW OF SYSTEMS:    Review of Systems  Constitutional: Negative for chills and fever.  HENT: Negative for hearing loss.   Eyes: Negative for blurred vision, double vision and photophobia.  Respiratory: Negative for cough, hemoptysis and shortness of breath.   Cardiovascular: Negative for palpitations, orthopnea and leg swelling.  Gastrointestinal: Negative for abdominal pain, diarrhea and vomiting.  Genitourinary: Negative for dysuria and urgency.  Musculoskeletal: Negative for myalgias and neck pain.  Skin: Negative for rash.  Neurological: Negative for dizziness, focal weakness, seizures, weakness and headaches.  Psychiatric/Behavioral: Negative for memory loss. The patient does not have insomnia.     Nutrition:  Tolerating Diet: Tolerating PT:      DRUG ALLERGIES:  No Known Allergies  VITALS:  Blood pressure (!) 153/67, pulse 73, temperature 97.8 F (36.6 C), temperature source Oral, resp. rate 18, height 5\' 2"  (1.575 m), weight 89.4 kg (197 lb), SpO2 98 %.  PHYSICAL EXAMINATION:   Physical Exam  GENERAL:  76 y.o.-year-old patient lying in the bed with no acute distress.  EYES: Pupils equal, round, reactive to light and accommodation. No scleral icterus. Extraocular muscles intact.  HEENT: Head atraumatic, normocephalic. Oropharynx and nasopharynx clear.  NECK:  Supple, no jugular venous distention. No thyroid enlargement, no tenderness.  LUNGS: Normal breath sounds bilaterally, no wheezing, rales,rhonchi or crepitation. No use of accessory muscles of respiration.  CARDIOVASCULAR: S1, S2 normal. No murmurs,  rubs, or gallops.  ABDOMEN: Soft, nontender, nondistended. Bowel sounds present. No organomegaly or mass.  EXTREMITIES: No pedal edema, cyanosis, or clubbing.  NEUROLOGIC: Cranial nerves II through XII are intact. Muscle strength 5/5 in all extremities. Sensation intact. Gait not checked.  PSYCHIATRIC: The patient is alert and oriented x 3.  SKIN: No obvious rash, lesion, or ulcer.    LABORATORY PANEL:   CBC  Recent Labs Lab 10/10/16 0329  WBC 0.6*  HGB 9.5*  HCT 27.0*  PLT 57*   ------------------------------------------------------------------------------------------------------------------  Chemistries   Recent Labs Lab 10/09/16 1255 10/10/16 0329 10/10/16 2124  NA 127* 132*  --   K 3.7 3.0* 3.5  CL 95* 102  --   CO2 22 25  --   GLUCOSE 150* 113*  --   BUN 11 9  --   CREATININE 0.53 0.66  --   CALCIUM 8.0* 7.8*  --   AST 20  --   --   ALT 19  --   --   ALKPHOS 136*  --   --   BILITOT 1.2  --   --    ------------------------------------------------------------------------------------------------------------------  Cardiac Enzymes  Recent Labs Lab 10/09/16 1255  TROPONINI <0.03   ------------------------------------------------------------------------------------------------------------------  RADIOLOGY:  Dg Chest 2 View  Result Date: 10/10/2016 CLINICAL DATA:  Metastatic breast cancer.  Neutropenia.  Fever. EXAM: CHEST  2 VIEW COMPARISON:  10/09/2016.  09/10/2016.  08/28/2016. FINDINGS: Artifact overlies chest. Heart size is normal. Power port on the right inserted from an internal jugular approach has its tip in the SVC 3 cm above the right atrium. The right lung is clear. There is abnormal density at  the left cardiophrenic angle. This could be due to volume loss or pneumonia. In this setting, lingular pneumonia is the concern. IMPRESSION: Worsening density at the left cardiophrenic angle worrisome for lingular pneumonia. Electronically Signed   By: Nelson Chimes M.D.   On: 10/10/2016 10:09   Dg Chest 2 View  Result Date: 10/09/2016 CLINICAL DATA:  Metastatic breast cancer with chemotherapy and fever. EXAM: CHEST  2 VIEW COMPARISON:  Sep 10, 2016 FINDINGS: A right Port-A-Cath is in stable position. No pneumothorax. Cardiomegaly persists. The hila and mediastinum are normal. No pulmonary nodules or masses are seen. No focal infiltrates. No change in the bones. IMPRESSION: No cause for fever identified. Electronically Signed   By: Dorise Bullion III M.D   On: 10/09/2016 13:57     ASSESSMENT AND PLAN:   Active Problems:   Neutropenic fever ([place])  #1. neutropenic fever. Likely due to left lingular  pneumonia. Continue vancomycin, cefepime,  Blood cultures are positive only in one bottle out of 4, called micro,they said it's:  Gram postive  Cocci,likley staph(contaminant) but final results are available late afternoon today, if they are showing contaminant patient can be discharged home with Levaquin for pneumonia for 5 days.  #2. Hypokalemia: Potassium replaced.  #3. metastatic breast cancer. Follows up with oncology, seen by Dr. Randa Evens yesterday.  D/w RN and pt.    All the records are reviewed and case discussed with Care Management/Social Workerr. Management plans discussed with the patient, family and they are in agreement.  CODE STATUS: full  TOTAL TIME TAKING CARE OF THIS PATIENT:35 minutes.   POSSIBLE D/C IN 1-2DAYS, DEPENDING ON CLINICAL CONDITION.   Epifanio Lesches M.D on 10/11/2016 at 12:03 PM  Between 7am to 6pm - Pager - [phone]  After 6pm go to [email or web address] - password [email or web address] [facility]  Office  [phone]  CC: Primary care physician; Letta Median, MD

## 2016-10-11 NOTE — Plan of Care (Signed)
Problem: Bowel/Gastric: Goal: Will not experience complications related to bowel motility Outcome: Progressing Pt with continued diarrhea. C-diff negative.

## 2016-10-12 LAB — CULTURE, BLOOD (ROUTINE X 2): SPECIAL REQUESTS: ADEQUATE

## 2016-10-12 LAB — VANCOMYCIN, TROUGH: Vancomycin Tr: 7 ug/mL — ABNORMAL LOW (ref 15–20)

## 2016-10-12 MED ORDER — VANCOMYCIN HCL IN DEXTROSE 1-5 GM/200ML-% IV SOLN
1000.0000 mg | Freq: Two times a day (BID) | INTRAVENOUS | Status: DC
  Administered 2016-10-12: 06:00:00 1000 mg via INTRAVENOUS
  Filled 2016-10-12 (×2): qty 200

## 2016-10-12 MED ORDER — SODIUM CHLORIDE 0.9% FLUSH
10.0000 mL | INTRAVENOUS | Status: AC | PRN
  Administered 2016-10-12: 10:00:00 10 mL

## 2016-10-12 MED ORDER — HEPARIN SOD (PORK) LOCK FLUSH 100 UNIT/ML IV SOLN
500.0000 [IU] | INTRAVENOUS | Status: AC | PRN
  Administered 2016-10-12: 500 [IU]
  Filled 2016-10-12: qty 5

## 2016-10-12 NOTE — Care Management (Signed)
Admitted to [facility] with the diagnosis of neutropenic fever. Lives with husband. Seen Dr, Rebeca Alert last Wednesday. Goes to the [facility] and sees Dr. Janese Banks for her breast Cancer. Receiving chemotherapy. Next treatment is Thursday. Prescriptions are filled at [facility] on [address]. No home Health. No skilled facility. No home oxygen. No equipment in the home. Takes care of all basic and instrumental activities of daily living herself. No falls. Always [place]. Discharge to home today per Dr. Vianne Bulls.  Husband will transport. Shelbie Ammons RN MSN CCM Care Management [phone]

## 2016-10-12 NOTE — Discharge Summary (Signed)
Victoria Steele, is a 76 y.o. female  DOB 24-Sep-1940  MRN [id number].  Admission date:  10/09/2016  Admitting Physician  Loletha Grayer, MD  Discharge Date:  10/12/2016   Primary MD  Letta Median, MD  Recommendations for primary care physician for things to follow:  Discharge home and follow up with Dr. Randa Evens  On Thursday July 5th   Admission Diagnosis  Neutropenic fever ([place]) [D70.9, R50.81]   Discharge Diagnosis  Neutropenic fever ([place]) [D70.9, R50.81]    Active Problems:   Neutropenic fever ([place])      Past Medical History:  Diagnosis Date  . Cancer ([place])   . Hypertension   . Port catheter in place 09/14/2016   Placed 09/10/2016 RT chest.    Past Surgical History:  Procedure Laterality Date  . ABDOMINAL HYSTERECTOMY    . APPENDECTOMY    . PORTACATH PLACEMENT Right 09/10/2016   Procedure: INSERTION PORT-A-CATH;  Surgeon: Jules Husbands, MD;  Location: [facility] ORS;  Service: General;  Laterality: Right;       History of present illness and  Hospital Course:     Kindly see H&P for history of present illness and admission details, please review complete Labs, Consult reports and Test reports for all details in brief  HPI  from the history and physical done on the day of admission 77 year old female patient with metastatic breast cancer on chemotherapy came in because of neutropenic fever. No arm shortness of breath or cough on admission but the only complaint is diarrhea.   Hospital Course  #81.76 year old female patient with neutropenic fever. Started on vancomycin and cefepime on admission, blood cultures, urine cultures, chest x-ray are done. patient the blood cultures are showing only one  bottle out of 4 bottles positive for coagulase negative staph. Urine cultures negative for infection. chest  x-ray showed left lingular pneumonia. Stool  C. difficile, GI panel negative, diarrhea resolved. She  will go home with Levaquin for 7 days for pneumonia treatment. #2. Metastatic breast cancer on chemotherapy, follows up with Dr. Fanny Dance in our. Seen by Dr. Phillip Heal Monday while in the hospital. #3. Pancytopenia due to chemotherapy. Her patient received a Neupogen . WBC improved from 0.6 to 4. #4 hypokalemia: Replace the potassium.   Discharge Condition: stable   Follow UP  Follow-up Information    Bender, Durene Cal, MD Follow up in 5 day(s).   Specialty:  Family Medicine Contact information: [place] [address] [phone]        Cammie Sickle, MD Follow up in 5 day(s).   Specialties:  Internal Medicine, Oncology Contact information: [place] [address] [phone]             Discharge Instructions  and  Discharge Medications      Allergies as of 10/12/2016   No Known Allergies     Medication List    TAKE these medications   acetaminophen 500 MG tablet Commonly known as:  TYLENOL Take 1,000 mg by mouth every 6 (six) hours as needed for moderate pain.   amLODipine 5 MG tablet Commonly known as:  NORVASC Take 1 tablet (5 mg total) by mouth daily.   dexamethasone 4 MG tablet Commonly known as:  DECADRON Take 2 tablets by mouth once a day on the day after chemotherapy and then take 2 tablets two times a day for 2 days. Take with food.   diphenoxylate-atropine 2.5-0.025 MG tablet Commonly known as:  LOMOTIL Take 1 tablet by mouth 4 (four) times daily as needed for diarrhea or loose stools.   hydrochlorothiazide 12.5 MG capsule Commonly known as:  MICROZIDE Take 12.5 mg by mouth daily.   levofloxacin 750 MG tablet Commonly known as:  LEVAQUIN Take 1 tablet (750 mg total) by mouth daily.   lidocaine-prilocaine cream Commonly known as:  EMLA Apply to affected area once   LORazepam 0.5 MG  tablet Commonly known as:  ATIVAN Take 1 tablet (0.5 mg total) by mouth every 6 (six) hours as needed (Nausea or vomiting). What changed:  reasons to take this  additional instructions   ondansetron 8 MG tablet Commonly known as:  ZOFRAN Take 1 tablet (8 mg total) by mouth 2 (two) times daily as needed. Start on the third day after chemotherapy. What changed:  reasons to take this  additional instructions   prochlorperazine 10 MG tablet Commonly known as:  COMPAZINE Take 1 tablet (10 mg total) by mouth every 6 (six) hours as needed (Nausea or vomiting). What changed:  reasons to take this         Diet and Activity recommendation: See Discharge Instructions above   Consults obtained -Oncology   Major procedures and Radiology Reports - PLEASE review detailed and final reports for all details, in brief -      Dg Chest 2 View  Result Date: 10/10/2016 CLINICAL DATA:  Metastatic breast cancer.  Neutropenia.  Fever. EXAM: CHEST  2 VIEW COMPARISON:  10/09/2016.  09/10/2016.  08/28/2016. FINDINGS: Artifact overlies chest. Heart size is normal. Power port on the right inserted from an internal jugular approach has its tip in the SVC 3 cm above the right atrium. The right lung is clear. There is abnormal density at the left cardiophrenic angle. This could be due to volume loss or pneumonia. In this setting, lingular pneumonia is the concern. IMPRESSION: Worsening density at the left cardiophrenic angle worrisome for lingular pneumonia. Electronically Signed   By: Nelson Chimes M.D.   On: 10/10/2016 10:09   Dg Chest 2 View  Result Date: 10/09/2016 CLINICAL DATA:  Metastatic breast cancer with chemotherapy and fever. EXAM: CHEST  2 VIEW COMPARISON:  Sep 10, 2016 FINDINGS: A right Port-A-Cath is in stable position. No pneumothorax. Cardiomegaly persists. The hila and mediastinum are normal. No pulmonary nodules or masses are seen. No focal infiltrates. No change in the bones.  IMPRESSION: No cause for fever identified. Electronically Signed   By: Dorise Bullion III M.D   On: 10/09/2016 13:57   Nm Cardiac Muga Rest  Result Date: 09/14/2016 CLINICAL DATA:  LEFT breast cancer, pre cardiotoxic chemotherapy EXAM: NUCLEAR MEDICINE CARDIAC BLOOD POOL IMAGING (MUGA) TECHNIQUE: Cardiac multi-gated acquisition was performed at rest following intravenous injection of Tc-57m labeled red blood cells. RADIOPHARMACEUTICALS:  21.445 mCi Tc-38m pertechnetate in-vitro labeled autologous red blood cells IV COMPARISON:  None FINDINGS: Calculated LEFT ventricular ejection fraction is 71% which is within the normal range. Study was obtained at at a cardiac rate of 78 beats per minute. Patient was rhythmic during imaging. Cine analysis of the LEFT ventricle in 3 projections demonstrates normal LEFT ventricular wall motion. IMPRESSION: Normal LEFT ventricular ejection fraction of 71%. Normal LV wall motion. Electronically Signed   By: Lavonia Dana M.D.   On: 09/14/2016 15:42    Micro Results     Recent Results (from the past 240 hour(s))  Blood culture (routine x 2)     Status: Abnormal (Preliminary result)   Collection Time:  10/09/16 12:52 PM  Result Value Ref Range Status   Specimen Description BLOOD LEFT ANTECUBITAL  Final   Special Requests   Final    BOTTLES DRAWN AEROBIC AND ANAEROBIC Blood Culture adequate volume   Culture  Setup Time   Final    GRAM POSITIVE COCCI ANAEROBIC BOTTLE ONLY CRITICAL RESULT CALLED TO, READ BACK BY AND VERIFIED WITH: Violeta Gelinas @ 1030 10/10/16 by [facility]    Culture STAPHYLOCOCCUS SPECIES (COAGULASE NEGATIVE) (A)  Final   Report Status PENDING  Incomplete  Blood Culture ID Panel (Reflexed)     Status: Abnormal   Collection Time: 10/09/16 12:52 PM  Result Value Ref Range Status   Enterococcus species NOT DETECTED NOT DETECTED Final   Listeria monocytogenes NOT DETECTED NOT DETECTED Final   Staphylococcus species DETECTED (A) NOT DETECTED Final     Comment: Methicillin (oxacillin) susceptible coagulase negative staphylococcus. Possible blood culture contaminant (unless isolated from more than one blood culture draw or clinical case suggests pathogenicity). No antibiotic treatment is indicated for blood  culture contaminants. CRITICAL RESULT CALLED TO, READ BACK BY AND VERIFIED WITH: Violeta Gelinas @ 1030 10/10/16 by [place]    Staphylococcus aureus NOT DETECTED NOT DETECTED Final   Methicillin resistance NOT DETECTED NOT DETECTED Final   Streptococcus species NOT DETECTED NOT DETECTED Final   Streptococcus agalactiae NOT DETECTED NOT DETECTED Final   Streptococcus pneumoniae NOT DETECTED NOT DETECTED Final   Streptococcus pyogenes NOT DETECTED NOT DETECTED Final   Acinetobacter baumannii NOT DETECTED NOT DETECTED Final   Enterobacteriaceae species NOT DETECTED NOT DETECTED Final   Enterobacter cloacae complex NOT DETECTED NOT DETECTED Final   Escherichia coli NOT DETECTED NOT DETECTED Final   Klebsiella oxytoca NOT DETECTED NOT DETECTED Final   Klebsiella pneumoniae NOT DETECTED NOT DETECTED Final   Proteus species NOT DETECTED NOT DETECTED Final   Serratia marcescens NOT DETECTED NOT DETECTED Final   Haemophilus influenzae NOT DETECTED NOT DETECTED Final   Neisseria meningitidis NOT DETECTED NOT DETECTED Final   Pseudomonas aeruginosa NOT DETECTED NOT DETECTED Final   Candida albicans NOT DETECTED NOT DETECTED Final   Candida glabrata NOT DETECTED NOT DETECTED Final   Candida krusei NOT DETECTED NOT DETECTED Final   Candida parapsilosis NOT DETECTED NOT DETECTED Final   Candida tropicalis NOT DETECTED NOT DETECTED Final  Blood culture (routine x 2)     Status: None (Preliminary result)   Collection Time: 10/09/16  1:01 PM  Result Value Ref Range Status   Specimen Description BLOOD RIGHT ANTECUBITAL  Final   Special Requests   Final    BOTTLES DRAWN AEROBIC AND ANAEROBIC Blood Culture adequate volume   Culture NO GROWTH 3 DAYS  Final    Report Status PENDING  Incomplete  Urine culture     Status: Abnormal   Collection Time: 10/09/16  3:26 PM  Result Value Ref Range Status   Specimen Description URINE, CLEAN CATCH  Final   Special Requests NONE  Final   Culture (A)  Final    <10,000 COLONIES/mL INSIGNIFICANT GROWTH Performed at [facility], [address] [place],  [address]    Report Status 10/11/2016 FINAL  Final  Gastrointestinal Panel by PCR , Stool     Status: None   Collection Time: 10/10/16 11:00 AM  Result Value Ref Range Status   Campylobacter species NOT DETECTED NOT DETECTED Final   Plesimonas shigelloides NOT DETECTED NOT DETECTED Final   Salmonella species NOT DETECTED NOT DETECTED Final  Yersinia enterocolitica NOT DETECTED NOT DETECTED Final   Vibrio species NOT DETECTED NOT DETECTED Final   Vibrio cholerae NOT DETECTED NOT DETECTED Final   Enteroaggregative E coli (EAEC) NOT DETECTED NOT DETECTED Final   Enteropathogenic E coli (EPEC) NOT DETECTED NOT DETECTED Final   Enterotoxigenic E coli (ETEC) NOT DETECTED NOT DETECTED Final   Shiga like toxin producing E coli (STEC) NOT DETECTED NOT DETECTED Final   Shigella/Enteroinvasive E coli (EIEC) NOT DETECTED NOT DETECTED Final   Cryptosporidium NOT DETECTED NOT DETECTED Final   Cyclospora cayetanensis NOT DETECTED NOT DETECTED Final   Entamoeba histolytica NOT DETECTED NOT DETECTED Final   Giardia lamblia NOT DETECTED NOT DETECTED Final   Adenovirus F40/41 NOT DETECTED NOT DETECTED Final   Astrovirus NOT DETECTED NOT DETECTED Final   Norovirus GI/GII NOT DETECTED NOT DETECTED Final   Rotavirus A NOT DETECTED NOT DETECTED Final   Sapovirus (I, II, IV, and V) NOT DETECTED NOT DETECTED Final  C difficile quick scan w PCR reflex     Status: None   Collection Time: 10/10/16 11:00 AM  Result Value Ref Range Status   C Diff antigen NEGATIVE NEGATIVE Final   C Diff toxin NEGATIVE NEGATIVE Final   C Diff interpretation No C. difficile  detected.  Final       Today   Subjective:   Victoria Steele today has no headache,no chest abdominal pain,no new weakness tingling or numbness, feels much better wants to go home today.   Objective:   Blood pressure (!) 152/54, pulse 81, temperature 98.4 F (36.9 C), temperature source Oral, resp. rate 20, height 5\' 2"  (1.575 m), weight 89.4 kg (197 lb), SpO2 97 %.   Intake/Output Summary (Last 24 hours) at 10/12/16 0810 Last data filed at 10/12/16 0541  Gross per 24 hour  Intake              960 ml  Output                0 ml  Net              960 ml    Exam Awake Alert, Oriented x 3, No new F.N deficits, Normal affect .AT,PERRAL Supple Neck,No JVD, No cervical lymphadenopathy appriciated.  Symmetrical Chest wall movement, Good air movement bilaterally, CTAB RRR,No Gallops,Rubs or new Murmurs, No Parasternal Heave +ve B.Sounds, Abd Soft, Non tender, No organomegaly appriciated, No rebound -guarding or rigidity. No Cyanosis, Clubbing or edema, No new Rash or bruise  Data Review   CBC w Diff:  Lab Results  Component Value Date   WBC 4.0 10/11/2016   HGB 10.1 (L) 10/11/2016   HCT 29.5 (L) 10/11/2016   PLT 84 (L) 10/11/2016   LYMPHOPCT 7 10/11/2016   BANDSPCT 11 10/11/2016   MONOPCT 6 10/11/2016   EOSPCT 0 10/11/2016   BASOPCT 2 10/11/2016    CMP:  Lab Results  Component Value Date   NA 132 (L) 10/10/2016   K 3.5 10/10/2016   CL 102 10/10/2016   CO2 25 10/10/2016   BUN 9 10/10/2016   CREATININE 0.66 10/10/2016   PROT 6.5 10/09/2016   ALBUMIN 3.4 (L) 10/09/2016   BILITOT 1.2 10/09/2016   ALKPHOS 136 (H) 10/09/2016   AST 20 10/09/2016   ALT 19 10/09/2016  .   Total Time in preparing paper work, data evaluation and todays exam - 73 minutes  Stamatia Masri M.D on 10/12/2016 at 8:10 AM    Note: This dictation was prepared  with Dragon dictation along with smaller phrase technology. Any transcriptional errors that result from this process are  unintentional.

## 2016-10-12 NOTE — Progress Notes (Signed)
Pharmacy Antibiotic Note  Victoria Steele is a 76 y.o. female admitted on 10/09/2016 with sepsis with neutropenic fever.  Pharmacy has been consulted for vancomycin and cefepime dosing. Pt received vancomycin 1 g IV x1 at 1334 and Zosyn 3.375 g IV x1 at 1420 in the ED.   Plan: Start 10 h after initial dose Vancomycin 1000 mg IV every 18 hours.  Goal trough 15-20 mcg/mL. Zosyn 3.375g IV q8h (4 hour infusion).   Ke 0.045, half life 15.4 h, Vd 46.1 L Vanc trough before 4th dose of regimen - 7/2 at 0430.   Height: 5\' 2"  (157.5 cm) Weight: 197 lb (89.4 kg) IBW/kg (Calculated) : 50.1  Temp (24hrs), Avg:98.5 F (36.9 C), Min:97.8 F (36.6 C), Max:99.3 F (37.4 C)   Recent Labs Lab 10/09/16 1255 10/09/16 1257 10/10/16 0329 10/11/16 1203 10/12/16 0437  WBC 0.3*  --  0.6* 4.0  --   CREATININE 0.53  --  0.66  --   --   LATICACIDVEN  --  1.4  --   --   --   VANCOTROUGH  --   --   --   --  7*    Estimated Creatinine Clearance: 65.1 mL/min (by C-G formula based on SCr of 0.66 mg/dL).    No Known Allergies  Antimicrobials this admission: Zosyn x1 6/29 Vancomycin  6/29 >> Cefepime 6/29 >>  Dose adjustments this admission: 7/2 0430 vanc level 7. Changing to 1 gram q 12 hours. Level before 4th new dose.   Microbiology results: UCx BCx x2 C diff GI panel  Thank you for allowing pharmacy to be a part of this patient's care.  Cortavious Nix S 10/12/2016 5:05 AM

## 2016-10-12 NOTE — Progress Notes (Signed)
Pt has been discharged home. Discharge papers given and explained to pt. Pt verbalized understanding. Meds and f/u appointments reviewed with pt. RX sent electronicaly to pharmacy by MD. Pt escorted in a wheelchair.

## 2016-10-12 NOTE — Care Management Important Message (Signed)
Important Message  Patient Details  Name: [name] MRN: [id number] Date of Birth: [birth year]   Medicare Important Message Given:  Yes    Shelbie Ammons, RN 10/12/2016, 8:56 AM

## 2016-10-14 LAB — CULTURE, BLOOD (ROUTINE X 2)
Culture: NO GROWTH
Special Requests: ADEQUATE

## 2016-10-15 ENCOUNTER — Other Ambulatory Visit: Payer: Self-pay | Admitting: *Deleted

## 2016-10-15 ENCOUNTER — Encounter
Admission: RE | Admit: 2016-10-15 | Discharge: 2016-10-15 | Disposition: A | Discharge status: Home / Self Care | Payer: Medicare Other | Source: Ambulatory Visit | Attending: Surgery | Admitting: Surgery

## 2016-10-15 ENCOUNTER — Telehealth: Payer: Self-pay

## 2016-10-15 ENCOUNTER — Telehealth: Payer: Self-pay | Admitting: Surgery

## 2016-10-15 ENCOUNTER — Inpatient Hospital Stay: Payer: Medicare Other

## 2016-10-15 ENCOUNTER — Other Ambulatory Visit: Payer: Self-pay

## 2016-10-15 ENCOUNTER — Inpatient Hospital Stay: Payer: Medicare Other | Attending: Oncology | Admitting: Oncology

## 2016-10-15 ENCOUNTER — Encounter: Payer: Self-pay | Admitting: Surgery

## 2016-10-15 ENCOUNTER — Encounter: Payer: Self-pay | Admitting: Oncology

## 2016-10-15 ENCOUNTER — Ambulatory Visit (INDEPENDENT_AMBULATORY_CARE_PROVIDER_SITE_OTHER): Payer: Medicare Other | Admitting: Surgery

## 2016-10-15 ENCOUNTER — Ambulatory Visit
Admission: RE | Admit: 2016-10-15 | Discharge: 2016-10-15 | Disposition: A | Discharge status: Home / Self Care | Payer: Medicare Other | Source: Ambulatory Visit | Attending: Surgery | Admitting: Surgery

## 2016-10-15 VITALS — BP 158/87 | HR 84 | Temp 98.1°F | Ht 62.0 in | Wt 204.0 lb

## 2016-10-15 DIAGNOSIS — Z170 Estrogen receptor positive status [ER+]: Secondary | ICD-10-CM | POA: Insufficient documentation

## 2016-10-15 DIAGNOSIS — Z7689 Persons encountering health services in other specified circumstances: Secondary | ICD-10-CM | POA: Insufficient documentation

## 2016-10-15 DIAGNOSIS — C7951 Secondary malignant neoplasm of bone: Secondary | ICD-10-CM

## 2016-10-15 DIAGNOSIS — Z01818 Encounter for other preprocedural examination: Secondary | ICD-10-CM | POA: Insufficient documentation

## 2016-10-15 DIAGNOSIS — J189 Pneumonia, unspecified organism: Secondary | ICD-10-CM

## 2016-10-15 DIAGNOSIS — R229 Localized swelling, mass and lump, unspecified: Secondary | ICD-10-CM | POA: Diagnosis not present

## 2016-10-15 DIAGNOSIS — K760 Fatty (change of) liver, not elsewhere classified: Secondary | ICD-10-CM | POA: Diagnosis not present

## 2016-10-15 DIAGNOSIS — IMO0002 Reserved for concepts with insufficient information to code with codable children: Secondary | ICD-10-CM | POA: Insufficient documentation

## 2016-10-15 DIAGNOSIS — Z95828 Presence of other vascular implants and grafts: Secondary | ICD-10-CM

## 2016-10-15 DIAGNOSIS — C50912 Malignant neoplasm of unspecified site of left female breast: Secondary | ICD-10-CM

## 2016-10-15 DIAGNOSIS — I10 Essential (primary) hypertension: Secondary | ICD-10-CM | POA: Insufficient documentation

## 2016-10-15 DIAGNOSIS — Z5111 Encounter for antineoplastic chemotherapy: Secondary | ICD-10-CM | POA: Diagnosis not present

## 2016-10-15 DIAGNOSIS — Z79899 Other long term (current) drug therapy: Secondary | ICD-10-CM | POA: Insufficient documentation

## 2016-10-15 DIAGNOSIS — C50012 Malignant neoplasm of nipple and areola, left female breast: Secondary | ICD-10-CM | POA: Insufficient documentation

## 2016-10-15 DIAGNOSIS — D6481 Anemia due to antineoplastic chemotherapy: Secondary | ICD-10-CM

## 2016-10-15 DIAGNOSIS — Z90710 Acquired absence of both cervix and uterus: Secondary | ICD-10-CM | POA: Insufficient documentation

## 2016-10-15 LAB — COMPREHENSIVE METABOLIC PANEL WITH GFR
ALT: 22 U/L (ref 14–54)
AST: 32 U/L (ref 15–41)
Albumin: 3.4 g/dL — ABNORMAL LOW (ref 3.5–5.0)
Alkaline Phosphatase: 137 U/L — ABNORMAL HIGH (ref 38–126)
Anion gap: 8 (ref 5–15)
BUN: 8 mg/dL (ref 6–20)
CO2: 25 mmol/L (ref 22–32)
Calcium: 8.7 mg/dL — ABNORMAL LOW (ref 8.9–10.3)
Chloride: 105 mmol/L (ref 101–111)
Creatinine, Ser: 0.62 mg/dL (ref 0.44–1.00)
GFR calc Af Amer: >60 mL/min
GFR calc non Af Amer: >60 mL/min
Glucose, Bld: 99 mg/dL (ref 65–99)
Potassium: 3.4 mmol/L — ABNORMAL LOW (ref 3.5–5.1)
Sodium: 138 mmol/L (ref 135–145)
Total Bilirubin: 0.4 mg/dL (ref 0.3–1.2)
Total Protein: 6.3 g/dL — ABNORMAL LOW (ref 6.5–8.1)

## 2016-10-15 LAB — CBC WITH DIFFERENTIAL/PLATELET
Basophils Absolute: 0.2 K/uL — ABNORMAL HIGH (ref 0–0.1)
Basophils Relative: 1 %
Eosinophils Absolute: 0 K/uL (ref 0–0.7)
Eosinophils Relative: 0 %
HCT: 29.1 % — ABNORMAL LOW (ref 35.0–47.0)
Hemoglobin: 10.2 g/dL — ABNORMAL LOW (ref 12.0–16.0)
Lymphocytes Relative: 6 %
Lymphs Abs: 0.9 K/uL — ABNORMAL LOW (ref 1.0–3.6)
MCH: 30.6 pg (ref 26.0–34.0)
MCHC: 34.9 g/dL (ref 32.0–36.0)
MCV: 87.7 fL (ref 80.0–100.0)
Monocytes Absolute: 0.5 K/uL (ref 0.2–0.9)
Monocytes Relative: 4 %
Neutro Abs: 12.1 K/uL — ABNORMAL HIGH (ref 1.4–6.5)
Neutrophils Relative %: 89 %
Platelets: 189 K/uL (ref 150–440)
RBC: 3.32 MIL/uL — ABNORMAL LOW (ref 3.80–5.20)
RDW: 15 % — ABNORMAL HIGH (ref 11.5–14.5)
WBC: 13.7 K/uL — ABNORMAL HIGH (ref 3.6–11.0)

## 2016-10-15 MED ORDER — PALBOCICLIB 125 MG PO CAPS: 125.0000 mg | ORAL_CAPSULE | Freq: Every day | ORAL | 5 refills | Status: DC

## 2016-10-15 MED ORDER — HEPARIN SOD (PORK) LOCK FLUSH 100 UNIT/ML IV SOLN
500.0000 [IU] | Freq: Once | INTRAVENOUS | Status: AC
  Administered 2016-10-15: 500 [IU] via INTRAVENOUS

## 2016-10-15 MED ORDER — SODIUM CHLORIDE 0.9% FLUSH
10.0000 mL | INTRAVENOUS | Status: DC | PRN
  Administered 2016-10-15: 10 mL via INTRAVENOUS
  Filled 2016-10-15: qty 10

## 2016-10-15 NOTE — Telephone Encounter (Signed)
Called pre-admit back at this time. Patient is currently taking Levaquin. Patient was seen in office today and no complaints of Shortness of breath, fever, chills. Asked if chest xray can be repeated?  Patient scheduled for surgery tomorrow.

## 2016-10-15 NOTE — Pre-Procedure Instructions (Signed)
Patient's last CXR 10/10/16 revealed "worsening density at the left cardiophrenic angle worrisome for lingular pneumoia". Patient is currently taking Levaquin po. Dr. Kayleen Memos (anesthesiologist) notified and recommended patient be clear of pneumonia prior to having surgery. Dr. Corlis Leak office notified of such and ordered a CXR today. Today's CXR revealed "no active cardiopulmonary disease". Dr. Kayleen Memos notified of this result and again recommends that the patient be symptom free of pneumonia for 2 weeks prior to having surgery. Dr. Corlis Leak office notified and surgery for tomorrow is cancelled. Patient notified via telephone.

## 2016-10-15 NOTE — Progress Notes (Unsigned)
[place]

## 2016-10-15 NOTE — Telephone Encounter (Signed)
Pt advised of pre op date/time and sx date. Sx: 10/16/16 with Dr Pabon--Excision of mass on buttocks.  Pre op: 10/15/16 @ 12:00pm--office.   Patient made aware to arrive at 7:45am the day of surgery.

## 2016-10-15 NOTE — Progress Notes (Signed)
Patient recently started on Amlodipine for BP and is also currently taking Levaquin for pneumonia.

## 2016-10-15 NOTE — Progress Notes (Signed)
Surgical Consultation  10/15/2016  Victoria Steele is an 76 y.o. female.   Chief Complaint  Patient presents with  . 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 Janese Banks, case d/w her. Well known pt w met breast CA. She did have a soft tissue mass that now has necrosed. She experiences severe pain and notice that the skin turned black. Pain is moderate and sharp. Recent hospitalization from neutropenic fevers, last wbc 13, she is well from oncologic perspective according to Dr. Janese Banks  Past Medical History:  Diagnosis Date  . Cancer ([place])   . Hypertension   . Port catheter in place 09/14/2016   Placed 09/10/2016 RT chest.    Past Surgical History:  Procedure Laterality Date  . ABDOMINAL HYSTERECTOMY    . APPENDECTOMY    . PORTACATH PLACEMENT Right 09/10/2016   Procedure: INSERTION PORT-A-CATH;  Surgeon: Jules Husbands, MD;  Location: [facility] ORS;  Service: General;  Laterality: Right;    Family History  Problem Relation Age of Onset  . Parkinson's disease Mother   . Cancer Father     Social History:  reports that she has never smoked. She has never used smokeless tobacco. She reports that she does not drink alcohol or use drugs.  Allergies: No Known Allergies  Medications reviewed.     ROS Full ROS performed and is otherwise negative other than what is stated in the HPI    BP (!) 158/87   Pulse 84   Temp 98.1 F (36.7 C) (Oral)   Ht '5\' 2"'$  (1.575 m)   Wt 92.5 kg (204 lb)   BMI 37.31 kg/m   Physical Exam  Constitutional: She is oriented to person, place, and time and well-developed, well-nourished, and in no distress. No distress.  Neck: Normal range of motion. No JVD present.  Cardiovascular: Normal rate, regular rhythm and intact distal pulses.   Pulmonary/Chest: Effort normal. No stridor. No respiratory distress. She exhibits no tenderness.  Abdominal: Soft. She exhibits no distension. There is no tenderness. There is no rebound.   Neurological: She is alert and oriented to person, place, and time. Gait normal. GCS score is 15.  Skin: Skin is warm and dry.  Necrotic pedunculated right buttocks soft tissue mass, some inflammatory component at the base. No abscess   Psychiatric: Mood, memory, affect and judgment normal.  Nursing note and vitals reviewed.     Results for orders placed or performed in visit on 10/15/16 (from the past 48 hour(s))  CBC with Differential     Status: Abnormal   Collection Time: 10/15/16  8:46 AM  Result Value Ref Range   WBC 13.7 (H) 3.6 - 11.0 K/uL   RBC 3.32 (L) 3.80 - 5.20 MIL/uL   Hemoglobin 10.2 (L) 12.0 - 16.0 g/dL   HCT 29.1 (L) 35.0 - 47.0 %   MCV 87.7 80.0 - 100.0 fL   MCH 30.6 26.0 - 34.0 pg   MCHC 34.9 32.0 - 36.0 g/dL   RDW 15.0 (H) 11.5 - 14.5 %   Platelets 189 150 - 440 K/uL   Neutrophils Relative % 89 %   Neutro Abs 12.1 (H) 1.4 - 6.5 K/uL   Lymphocytes Relative 6 %   Lymphs Abs 0.9 (L) 1.0 - 3.6 K/uL   Monocytes Relative 4 %   Monocytes Absolute 0.5 0.2 - 0.9 K/uL   Eosinophils Relative 0 %   Eosinophils Absolute 0.0 0 - 0.7 K/uL  Basophils Relative 1 %   Basophils Absolute 0.2 (H) 0 - 0.1 K/uL  Comprehensive metabolic panel     Status: Abnormal   Collection Time: 10/15/16  8:46 AM  Result Value Ref Range   Sodium 138 135 - 145 mmol/L   Potassium 3.4 (L) 3.5 - 5.1 mmol/L   Chloride 105 101 - 111 mmol/L   CO2 25 22 - 32 mmol/L   Glucose, Bld 99 65 - 99 mg/dL   BUN 8 6 - 20 mg/dL   Creatinine, Ser 0.62 0.44 - 1.00 mg/dL   Calcium 8.7 (L) 8.9 - 10.3 mg/dL   Total Protein 6.3 (L) 6.5 - 8.1 g/dL   Albumin 3.4 (L) 3.5 - 5.0 g/dL   AST 32 15 - 41 U/L   ALT 22 14 - 54 U/L   Alkaline Phosphatase 137 (H) 38 - 126 U/L   Total Bilirubin 0.4 0.3 - 1.2 mg/dL   GFR calc non Af Amer >60 >60 mL/min   GFR calc Af Amer >60 >60 mL/min    Comment: (NOTE) The eGFR has been calculated using the CKD EPI equation. This calculation has not been validated in all clinical  situations. eGFR's persistently <60 mL/min signify possible Chronic Kidney Disease.    Anion gap 8 5 - 15   No results found.  Assessment/Plan: Necrotic mass buttocks in need for excision. We will schedule for Excision tomorrow under GETA. D/w the pt in detail about the procedure, risks ( bleeding, infection, recurrence, pain), benefits and possible complications . She understands  Caroleen Hamman, MD [facility] General Surgeon

## 2016-10-15 NOTE — Patient Instructions (Signed)
We will plan to remove your mass from your buttock area tomorrow, 10/16/16. You will arrive to the medical mall 2nd floor and check in at 0745am. We will take you to Pre-admission testing at this time to get your pre-op appointment completed. Please call our office with any other questions that you may have.  Please look at your [facility]) Pre-care sheet for further information.

## 2016-10-15 NOTE — Telephone Encounter (Signed)
Surgery was canceled due to previous chest x ray. Per anesthesia, patient will have to wait 2 weeks before having surgery. I have advised Dr Dahlia Byes and would like to do this in the office tomorrow at 8:15am.   I have contacted patient and she has agreed to come to office for the procedure.   Surgery has been cancelled at [place].

## 2016-10-15 NOTE — Progress Notes (Signed)
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 Patient is a 77 year old female with no significant comorbidities who noticed left breast mass about 6-9 months ago. She thought it would go awaybut it continued to increase in size and began to involve her skin. She also noted tenderness to palpation as well as intermittent sharp tingling pain. She was seen by Dr. Adora Fridge on 08/25/2016 and patient had a biopsy of her skin in his office which showed:DIAGNOSIS:  A. LEFT BREAST SKIN; EXCISION:  - INVASIVE CARCINOMA MORPHOLOGICALLY CONSISTENT WITH MAMMARY ORIGIN  INVOLVING THE DERMIS.   BREAST BIOMARKER TESTS  Estrogen Receptor (ER) Status: POSITIVE, >90%  Progesterone Receptor (PgR) Status: POSITIVE, >70%  Her2 negative  2. Patient underwent bilateral diagnostic mammogram on 08/31/2016 showed:IMPRESSION: 1. Known left breast cancer, retroareolar with probable extension to the nipple, measuring 4.3 cm greatest dimension by ultrasound, with associated architectural distortion and associated left nipple retraction, and with associated diffuse skin thickening on the left. 2. No enlarged or morphologically abnormal lymph nodes are seen in the left axilla by ultrasound. 3. No evidence of malignancy within the right breast.  3. Patient was also complaining of some perirectal pain and underwent CT abdomen pelvis with contrast on 09/02/2016 which showed: IMPRESSION: Left colonic diverticulosis. No active diverticulitis.Fatty liver. No acute findings or evidence of metastatic disease in the  abdomen or pelvis. Diffuse bony sclerotic metastases.  4. Other than that her breast pain patient overall feels well. Denies any unintentional weight loss. She continues to be active and care for her great-grandchildren. She has had a hysterectomy in the past. No family history of breast or ovarian cancer.  5. Given that she had inflammatory breast cancer- plan was to give 4 cycles of dose dense AC followed by possible mastectomy and treat bone mets with AI + ibrance  6. PET CT scan on 09/11/16 showed: IMPRESSION: 1. Low-grade but abnormal hypermetabolic activity associated with the cutaneous and sub areolar glandular tissues of the left breast, compatible with malignancy, representative SUV 3.9 compared to contralateral normal side 1.4. 2. Diffuse sclerotic osseous metastatic disease with low-grade hypermetabolic activity, a representative SUV along the left sacrum 5.4. 3. Other imaging findings of potential clinical significance: Aortic Atherosclerosis (ICD10-I70.0). Descending and sigmoid colon Diverticulosis.  7. Baseline MUGA scan showed EF of 71%. Baseline CA 27.29 elevated at 563.2    Interval history- she feels well today since her hospital discharge. She has 3 more days of antibiotics left. She has discomfort because of a lesion around her buttock which she has not mentioned to me before  ECOG PS- 1 Pain scale- 0   Review of systems- Review of Systems  Constitutional: Negative for chills, fever, malaise/fatigue and weight loss.  HENT: Negative for congestion, ear discharge and nosebleeds.   Eyes: Negative for blurred vision.  Respiratory: Negative for cough, hemoptysis, sputum production, shortness of breath and wheezing.   Cardiovascular: Negative for chest pain, palpitations, orthopnea and claudication.  Gastrointestinal: Negative for abdominal pain, blood in stool, constipation, diarrhea, heartburn, melena, nausea and vomiting.  Genitourinary: Negative for dysuria,  flank pain, frequency, hematuria and urgency.  Musculoskeletal:  Negative for back pain, joint pain and myalgias.  Skin: Negative for rash.       Lesion along gluteal fold  Neurological: Negative for dizziness, tingling, focal weakness, seizures, weakness and headaches.  Endo/Heme/Allergies: Does not bruise/bleed easily.  Psychiatric/Behavioral: Negative for depression and suicidal ideas. The patient does not have insomnia.        No Known Allergies   Past Medical History:  Diagnosis Date  . Cancer ([place])   . Hypertension   . Port catheter in place 09/14/2016   Placed 09/10/2016 RT chest.     Past Surgical History:  Procedure Laterality Date  . ABDOMINAL HYSTERECTOMY    . APPENDECTOMY    . PORTACATH PLACEMENT Right 09/10/2016   Procedure: INSERTION PORT-A-CATH;  Surgeon: Jules Husbands, MD;  Location: [facility] ORS;  Service: General;  Laterality: Right;    Social History   Social History  . Marital status: Married    Spouse name: N/A  . Number of children: N/A  . Years of education: N/A   Occupational History  . Not on file.   Social History Main Topics  . Smoking status: Never Smoker  . Smokeless tobacco: Never Used  . Alcohol use No  . Drug use: No  . Sexual activity: No   Other Topics Concern  . Not on file   Social History Narrative  . No narrative on file    Family History  Problem Relation Age of Onset  . Parkinson's disease Mother   . Cancer Father      Current Outpatient Prescriptions:  .  acetaminophen (TYLENOL) 500 MG tablet, Take 1,000 mg by mouth every 6 (six) hours as needed for moderate pain., Disp: , Rfl:  .  amLODipine (NORVASC) 5 MG tablet, Take 1 tablet (5 mg total) by mouth daily., Disp: 30 tablet, Rfl: 0 .  dexamethasone (DECADRON) 4 MG tablet, Take 2 tablets by mouth once a day on the day after chemotherapy and then take 2 tablets two times a day for 2 days. Take with food., Disp: 30 tablet, Rfl: 1 .  diphenoxylate-atropine (LOMOTIL)  2.5-0.025 MG tablet, Take 1 tablet by mouth 4 (four) times daily as needed for diarrhea or loose stools., Disp: 30 tablet, Rfl: 0 .  hydrochlorothiazide (MICROZIDE) 12.5 MG capsule, Take 12.5 mg by mouth daily., Disp: , Rfl:  .  levofloxacin (LEVAQUIN) 750 MG tablet, Take 1 tablet (750 mg total) by mouth daily., Disp: 7 tablet, Rfl: 0 .  lidocaine-prilocaine (EMLA) cream, Apply to affected area once, Disp: 30 g, Rfl: 3 .  LORazepam (ATIVAN) 0.5 MG tablet, Take 1 tablet (0.5 mg total) by mouth every 6 (six) hours as needed (Nausea or vomiting). (Patient taking differently: Take 0.5 mg by mouth every 6 (six) hours as needed. Nausea or vomiting), Disp: 30 tablet, Rfl: 0 .  ondansetron (ZOFRAN) 8 MG tablet, Take 1 tablet (8 mg total) by mouth 2 (two) times daily as needed. Start on the third day after chemotherapy. (Patient taking differently: Take 8 mg by mouth 2 (two) times daily as needed for nausea. Start on the third day after chemotherapy.), Disp: 30 tablet, Rfl: 1 .  prochlorperazine (COMPAZINE) 10 MG tablet, Take 1 tablet (10 mg total) by mouth every 6 (six) hours as needed (Nausea or vomiting). (Patient taking differently: Take 10 mg by mouth every 6 (six) hours as needed for nausea or vomiting. ), Disp: 30 tablet, Rfl: 1  Physical exam:  Vitals:   10/15/16 0904  BP: (!) 168/83  Pulse: 97  Resp: 18  Temp: 98.2 F (36.8 C)  TempSrc: Tympanic  Weight: 204 lb 5 oz (92.7 kg)   Physical Exam  Constitutional: She is oriented to person, place, and time and well-developed, well-nourished, and in no distress.  HENT:  Head: Normocephalic and atraumatic.  Eyes: EOM are normal. Pupils are equal, round, and reactive to light.  Neck: Normal range of motion.  Cardiovascular: Normal rate, regular rhythm and normal heart sounds.   Pulmonary/Chest: Effort normal and breath sounds normal.  Abdominal: Soft. Bowel sounds are normal.  Pedunculated firm black lesion about 5-6 cm noted overlying the right  buttock.  Neurological: She is alert and oriented to person, place, and time.  Skin: Skin is warm and dry.   left breast mass appears smaller and less firm on todays exam  CMP Latest Ref Rng & Units 10/10/2016  Glucose 65 - 99 mg/dL -  BUN 6 - 20 mg/dL -  Creatinine 0.44 - 1.00 mg/dL -  Sodium 135 - 145 mmol/L -  Potassium 3.5 - 5.1 mmol/L 3.5  Chloride 101 - 111 mmol/L -  CO2 22 - 32 mmol/L -  Calcium 8.9 - 10.3 mg/dL -  Total Protein 6.5 - 8.1 g/dL -  Total Bilirubin 0.3 - 1.2 mg/dL -  Alkaline Phos 38 - 126 U/L -  AST 15 - 41 U/L -  ALT 14 - 54 U/L -   CBC Latest Ref Rng & Units 10/11/2016  WBC 3.6 - 11.0 K/uL 4.0  Hemoglobin 12.0 - 16.0 g/dL 10.1(L)  Hematocrit 35.0 - 47.0 % 29.5(L)  Platelets 150 - 440 K/uL 84(L)    No images are attached to the encounter.  Dg Chest 2 View  Result Date: 10/10/2016 CLINICAL DATA:  Metastatic breast cancer.  Neutropenia.  Fever. EXAM: CHEST  2 VIEW COMPARISON:  10/09/2016.  09/10/2016.  08/28/2016. FINDINGS: Artifact overlies chest. Heart size is normal. Power port on the right inserted from an internal jugular approach has its tip in the SVC 3 cm above the right atrium. The right lung is clear. There is abnormal density at the left cardiophrenic angle. This could be due to volume loss or pneumonia. In this setting, lingular pneumonia is the concern. IMPRESSION: Worsening density at the left cardiophrenic angle worrisome for lingular pneumonia. Electronically Signed   By: Nelson Chimes M.D.   On: 10/10/2016 10:09   Dg Chest 2 View  Result Date: 10/09/2016 CLINICAL DATA:  Metastatic breast cancer with chemotherapy and fever. EXAM: CHEST  2 VIEW COMPARISON:  Sep 10, 2016 FINDINGS: A right Port-A-Cath is in stable position. No pneumothorax. Cardiomegaly persists. The hila and mediastinum are normal. No pulmonary nodules or masses are seen. No focal infiltrates. No change in the bones. IMPRESSION: No cause for fever identified. Electronically Signed    By: Dorise Bullion III M.D   On: 10/09/2016 13:57     Assessment and plan- Patient is a 76 y.o. female with Stage IV left breast cancer with bone only metastases s/p 2 cycles of dose dense AC complicated by nuetropenic fever  She has had some clinical response in the size of her left breast mass. She was just discharged from the hospital for neutropenic fever. I will therefore hold her chemotherapy today I will tentatively schedule her for cycle 3 AC next week and plan to give her 4 cycles every 3 weeks instead of 2 weeks given clinical response.   Patient has significant discomfort from her buttock lesion and I  spoke to Dr. Dahlia Byes today regarding this. He will see her next week and see if it can be excised in which case I will have to postpone her chemo by 1 more week.  After 4 cycles of AC, I will plan to switch ehr to letrozole and ibrance given bone only mets. I will work on getting it approved by insurance at this time  I will see ehr back tentatively in 4 weeks prior to 4th cycle of AC.   Chemo induced anemia- check iron studies, b12, folate  She will see dentist next week to get clearance prior to starting zometa  Will obtain baseline bone density scan prior to starting letrozole     Visit Diagnosis 1. Malignant neoplasm of left breast in female, estrogen receptor positive, unspecified site of breast ([place])   2. Bone metastases ([place])      Dr. Randa Evens, MD, MPH [facility] Pager- [phone] 10/15/2016 10:31 AM

## 2016-10-15 NOTE — Telephone Encounter (Signed)
Patient is scheduled to have surgery tomorrow. Denise from pre-admit is calling because patient had a CT x-ray on 10/10/16 that showed worsening Pneumonia. Pre-admit is requiring medical clearance before her surgery. You can contact her at [phone] if you have any questions.

## 2016-10-15 NOTE — Patient Instructions (Signed)
Your procedure is scheduled on: Friday, October 16, 2016 Report to Same Day Surgery on the 2nd floor in the [facility] at 8:00 AM Please call [phone] if you are going to be late.  REMEMBER: Instructions that are not followed completely may result in serious medical risk up to and including death; or upon the discretion of your surgeon and anesthesiologist your surgery may need to be rescheduled.  Do not eat food or drink liquids after midnight. No gum chewing or hard candies  No Alcohol for 24 hours before or after surgery.  No Smoking for 24 hours prior to surgery.  Notify your doctor if there is any change in your medical condition (cold, fever, infection).  Do not wear jewelry, make-up, hairpins, clips or nail polish.  Do not wear lotions, powders, or perfumes.   Do not shave 48 hours prior to surgery.   Do not bring valuables to the hospital. [facility] is not responsible for any belongings or valuables.   TAKE THESE MEDICATIONS THE MORNING OF SURGERY WITH A SIP OF WATER:  1. NORVASC  Use CHG Soap or wipes as directed on instruction sheet.  Stop Anti-inflammatories such as Advil, Aleve, Ibuprofen, Motrin, Naproxen, Naprosyn, Goodie powder, or aspirin products. (May take Tylenol or Acetaminophen and Celebrex if needed.)  Stop supplements until after surgery.  If you are being discharged the day of surgery, you will not be allowed to drive home. You will need someone to drive you home and stay with you that night.   If you are taking public transportation, you will need to have a responsible adult to with you.

## 2016-10-15 NOTE — Telephone Encounter (Signed)
Victoria Steele is calling in from pre-admit. Chest x-ray has been repeated and there are no signs of an infection of any kind. Dr. Arbie Cookey has been advised of this but will not sedate the patient until she is symptom free for 2 weeks. It has been short of 2 days from 1 week. I have spoken with Dr. Dahlia Byes, we will see the patient in the office tomorrow morning. Her surgery has been canceled.

## 2016-10-16 ENCOUNTER — Ambulatory Visit (INDEPENDENT_AMBULATORY_CARE_PROVIDER_SITE_OTHER): Payer: Medicare Other | Admitting: Surgery

## 2016-10-16 ENCOUNTER — Encounter: Payer: Self-pay | Admitting: Surgery

## 2016-10-16 ENCOUNTER — Ambulatory Visit: Admission: RE | Admit: 2016-10-16 | Payer: Medicare Other | Source: Ambulatory Visit | Admitting: Surgery

## 2016-10-16 ENCOUNTER — Encounter: Admission: RE | Payer: Self-pay | Source: Ambulatory Visit

## 2016-10-16 VITALS — BP 199/74 | HR 86 | Temp 97.9°F | Ht 62.0 in | Wt 205.4 lb

## 2016-10-16 DIAGNOSIS — D367 Benign neoplasm of other specified sites: Secondary | ICD-10-CM | POA: Diagnosis not present

## 2016-10-16 DIAGNOSIS — R229 Localized swelling, mass and lump, unspecified: Principal | ICD-10-CM

## 2016-10-16 DIAGNOSIS — IMO0002 Reserved for concepts with insufficient information to code with codable children: Secondary | ICD-10-CM

## 2016-10-16 SURGERY — EXCISION MASS
Anesthesia: Choice | Laterality: Right

## 2016-10-16 NOTE — Progress Notes (Signed)
PROCEDURES: Excision 7 cms right buttocks mass subcutaneous  Preop Dx; Necrotic buttocks mass  Post op Dx: same  EBL: minimal   Findings: necrotic pedunculated mass  Anesthesia: lidocaine 1 % w epi  Complications: none  Patient presents with a necrotic soft tissue mass in the right buttocks in need for excision. Discussed with the patient in detail of the risks, benefits and possible complications and a written consent was obtained. She is playing a prone position and she was prepped and draped in the usual sterile fashion. Lidocaine 1% with epinephrine was injected into the subq tissue. using a 15 blade knife we excise the mass incorporating its pedicle in the elliptical incision. Pedicle was involving the subq tissue. Cautery was used to obtain hemostasis. The specimen was sent for permanent pathology. There was a defect that because of the necrosis and infection currently present in inflammatory response I decided not to close. I did place a wet-to-dry gauze.  No complications

## 2016-10-16 NOTE — Patient Instructions (Addendum)
We will see you back in the office next Friday. Please see your appointment below.  In the meantime, you will place a damp 2x2 in the inside of the area, then place a dry guaze over this and secure with tape.  You may shower and wash this area with soap and water. If this area begins to bleed once again. Stack up guaze right over top of the bleeding in a significant stack (7-10 guaze) and sit directly on the bleeding to hold pressure. Do not get up for 5-10 minutes. Then secure dressing with tape. Do not pull off the bottom guaze if this occurs, this removes the clot and the area will begin bleeding once again.  Please use Tylenol for pain.  If you have any questions or concerns please call our office.

## 2016-10-20 LAB — SURGICAL PATHOLOGY

## 2016-10-21 ENCOUNTER — Ambulatory Visit: Payer: Medicare Other | Admitting: Surgery

## 2016-10-22 ENCOUNTER — Other Ambulatory Visit: Payer: Medicare Other

## 2016-10-22 ENCOUNTER — Telehealth: Payer: Self-pay | Admitting: *Deleted

## 2016-10-22 ENCOUNTER — Ambulatory Visit: Payer: Medicare Other

## 2016-10-22 NOTE — Telephone Encounter (Signed)
Thanks

## 2016-10-22 NOTE — Telephone Encounter (Signed)
Ibrance copay [id number] after insurance approval. I asked that they fax the PAth application to [place]

## 2016-10-23 ENCOUNTER — Telehealth: Payer: Self-pay

## 2016-10-23 ENCOUNTER — Ambulatory Visit (INDEPENDENT_AMBULATORY_CARE_PROVIDER_SITE_OTHER): Payer: Medicare Other | Admitting: Surgery

## 2016-10-23 ENCOUNTER — Encounter: Payer: Self-pay | Admitting: Surgery

## 2016-10-23 ENCOUNTER — Telehealth: Payer: Self-pay | Admitting: *Deleted

## 2016-10-23 VITALS — BP 167/99 | HR 91 | Temp 97.4°F | Ht 62.0 in | Wt 201.4 lb

## 2016-10-23 DIAGNOSIS — Z09 Encounter for follow-up examination after completed treatment for conditions other than malignant neoplasm: Secondary | ICD-10-CM

## 2016-10-23 NOTE — Telephone Encounter (Signed)
Patient is asking their office when she is to start her Ibrance. She states her insurance called and approved her meds, and told her to pick it up from pharmacy, but she wants to know when she is to start taking med.   I called patient and ewplained that we are working on an application for free drug for her since her co pay will be almost $3000 dollars. And that it is going to take a some time and to expect a call from [place] for financial information. She stated she is not in any hurry

## 2016-10-23 NOTE — Progress Notes (Signed)
S/p excision buttocks mass C/w necrotic lipoma THe pt did not change the packing but just the dressing  PE NAd Packing removed and dry dressing replaced. Wound healing well, no infection  A/P Doing well BID dry dressings D/w pt path RTC prn

## 2016-10-23 NOTE — Telephone Encounter (Signed)
Called Dr. Janese Banks office to get instructions on when patient is to start taking Ibrance as she has asked when to start this medication when in office today. I left a message on the triage nurse voicemail asking for this instruction and to either give our office a call or to call the patient.

## 2016-10-23 NOTE — Patient Instructions (Signed)
Continue to keep the area clean and change dressing twice daily.  If you need to be seen back in our office or have any questions please give our office a call.   How to Change Your Dressing A dressing is a material that is placed in and over wounds. A dressing helps your wound to heal by protecting it from:  Bacteria.  Worse injury.  Being too dry or too wet.  What are the risks? The sticky (adhesive) tape that is used with a dressing may make your skin sore or irritated, or it may cause a rash. These are the most common problems. However, more serious problems can develop, such as:  Bleeding.  Infection.  How to change your dressing Getting Ready to Change Your Dressing   Take a shower before you do the first dressing change of the day. If your doctor does not want your wound to get wet and your dressing is not waterproof, you may need to put plastic leak-proof sealing wrap on your dressing to protect it.  If needed, take pain medicine as told by your doctor 30 minutes before you change your dressing.  Set up a clean station for wound care. You will need: ? A plastic trash bag that is open and ready to use. ? Hand sanitizer. ? Wound cleanser or salt-water solution (saline) as told by your doctor. ? New dressing material or bandages. Make sure to open the dressing package so the dressing stays on the inside of the package. You may also need these supplies in your clean station:  A box of vinyl gloves.  Tape.  Skin protectant. This may be a wipe, film, or spray.  Clean or germ-free (sterile) scissors.  A cotton-tipped applicator.  Taking Off Your Old Dressing  Wash your hands with soap and water. Dry your hands with a clean towel. If you cannot use soap and water, use hand sanitizer.  If you are using gloves, put on the gloves before you take off the dressing.  Gently take off any adhesive or tape by pulling it off in the direction of your hair growth. Only touch  the outside edges of the dressing.  Take off the dressing. If the dressing sticks to your skin, wet the dressing with a germ-free salt-water solution. This helps it come off more easily.  Take off any gauze or packing in your wound.  Throw the old dressing supplies into the ready trash bag.  Take off your gloves. To take off each glove, grab the cuff with your other hand and turn the glove inside out. Put the gloves in the trash right away.  Wash your hands with soap and water. Dry your hands with a clean towel. If you cannot use soap and water, use hand sanitizer. Cleaning Your Wound  Follow instructions from your doctor about how to clean your wound. This may include using a salt-water solution or recommended wound cleanser.  Do not use over-the-counter medicated or antiseptic creams, sprays, liquids, or dressings unless your doctor tells you to do that.  Use a clean gauze pad to clean the area fully with the salt-water solution or wound cleanser that your doctor recommends.  Throw the gauze pad into the trash bag.  Wash your hands with soap and water. Dry your hands with a clean towel. If you cannot use soap and water, use hand sanitizer. Putting on the Dressing  If your doctor recommended a skin protectant, put it on the skin around the wound.  Cover the wound with the recommended dressing, such as a nonstick gauze or bandage. Make sure to touch only the outside edges of the dressing. Do not touch the inside of the dressing.  Attach the dressing so all sides stay in place. You may do this with the attached medical adhesive, roll gauze, or tape. If you use tape, do not wrap the tape all the way around your arm or leg.  Take off your gloves. Put them in the trash bag with the old dressing. Tie the bag shut and throw it away.  Wash your hands with soap and water. Dry your hands with a clean towel. If you cannot use soap and water, use hand sanitizer. Get help if:   You have new  pain.  You have irritation, a rash, or itching around the wound or dressing.  Changing your dressing is painful.  Changing your dressing causes a lot of bleeding. Get help right away if:  You have very bad pain.  You have signs of infection, such as: ? More redness, swelling, or pain. ? More fluid or blood. ? Warmth. ? Pus or a bad smell. ? Red streaks leading from wound. ? A fever. This information is not intended to replace advice given to you by your health care provider. Make sure you discuss any questions you have with your health care provider. Document Released: 06/26/2008 Document Revised: 09/05/2015 Document Reviewed: 01/03/2015 Elsevier Interactive Patient Education  [employer].

## 2016-10-28 ENCOUNTER — Telehealth: Payer: Self-pay | Admitting: *Deleted

## 2016-10-28 NOTE — Telephone Encounter (Signed)
I have papers done from MD side for the possibility of getting drug asst. I need pt to sign in some spots as well as bringing in proof of income. Pt states she is coming tom and she will bring the above and sign papers at that time

## 2016-10-29 ENCOUNTER — Inpatient Hospital Stay: Payer: Medicare Other

## 2016-10-29 ENCOUNTER — Telehealth: Payer: Self-pay | Admitting: Oncology

## 2016-10-29 ENCOUNTER — Inpatient Hospital Stay (HOSPITAL_BASED_OUTPATIENT_CLINIC_OR_DEPARTMENT_OTHER): Payer: Medicare Other | Admitting: Oncology

## 2016-10-29 ENCOUNTER — Encounter: Payer: Self-pay | Admitting: Oncology

## 2016-10-29 VITALS — BP 173/86 | HR 90 | Temp 98.0°F | Resp 16 | Ht 62.0 in | Wt 202.4 lb

## 2016-10-29 DIAGNOSIS — C50012 Malignant neoplasm of nipple and areola, left female breast: Secondary | ICD-10-CM

## 2016-10-29 DIAGNOSIS — D6481 Anemia due to antineoplastic chemotherapy: Secondary | ICD-10-CM | POA: Diagnosis not present

## 2016-10-29 DIAGNOSIS — Z5111 Encounter for antineoplastic chemotherapy: Secondary | ICD-10-CM | POA: Diagnosis not present

## 2016-10-29 DIAGNOSIS — C50912 Malignant neoplasm of unspecified site of left female breast: Secondary | ICD-10-CM

## 2016-10-29 DIAGNOSIS — C7951 Secondary malignant neoplasm of bone: Secondary | ICD-10-CM

## 2016-10-29 DIAGNOSIS — Z170 Estrogen receptor positive status [ER+]: Secondary | ICD-10-CM

## 2016-10-29 DIAGNOSIS — Z79899 Other long term (current) drug therapy: Secondary | ICD-10-CM

## 2016-10-29 LAB — CBC WITH DIFFERENTIAL/PLATELET
BASOS ABS: 0 10*3/uL (ref 0–0.1)
Basophils Relative: 1 %
EOS PCT: 1 %
Eosinophils Absolute: 0 10*3/uL (ref 0–0.7)
HEMATOCRIT: 31.8 % — AB (ref 35.0–47.0)
HEMOGLOBIN: 11.1 g/dL — AB (ref 12.0–16.0)
LYMPHS ABS: 0.7 10*3/uL — AB (ref 1.0–3.6)
LYMPHS PCT: 17 %
MCH: 31.5 pg (ref 26.0–34.0)
MCHC: 35 g/dL (ref 32.0–36.0)
MCV: 89.9 fL (ref 80.0–100.0)
Monocytes Absolute: 0.4 10*3/uL (ref 0.2–0.9)
Monocytes Relative: 10 %
NEUTROS ABS: 3.1 10*3/uL (ref 1.4–6.5)
Neutrophils Relative %: 71 %
Platelets: 208 10*3/uL (ref 150–440)
RBC: 3.54 MIL/uL — AB (ref 3.80–5.20)
RDW: 16.8 % — ABNORMAL HIGH (ref 11.5–14.5)
WBC: 4.4 10*3/uL (ref 3.6–11.0)

## 2016-10-29 LAB — COMPREHENSIVE METABOLIC PANEL
ALBUMIN: 3.8 g/dL (ref 3.5–5.0)
ALT: 31 U/L (ref 14–54)
ANION GAP: 8 (ref 5–15)
AST: 37 U/L (ref 15–41)
Alkaline Phosphatase: 220 U/L — ABNORMAL HIGH (ref 38–126)
BILIRUBIN TOTAL: 0.9 mg/dL (ref 0.3–1.2)
BUN: 9 mg/dL (ref 6–20)
CHLORIDE: 106 mmol/L (ref 101–111)
CO2: 23 mmol/L (ref 22–32)
Calcium: 9.3 mg/dL (ref 8.9–10.3)
Creatinine, Ser: 0.56 mg/dL (ref 0.44–1.00)
GFR calc Af Amer: >60 mL/min (ref >60)
Glucose, Bld: 118 mg/dL — ABNORMAL HIGH (ref 65–99)
POTASSIUM: 4.1 mmol/L (ref 3.5–5.1)
Sodium: 137 mmol/L (ref 135–145)
TOTAL PROTEIN: 6.9 g/dL (ref 6.5–8.1)

## 2016-10-29 LAB — IRON AND TIBC
Iron: 68 ug/dL (ref 28–170)
Saturation Ratios: 19 % (ref 10.4–31.8)
TIBC: 361 ug/dL (ref 250–450)
UIBC: 293 ug/dL

## 2016-10-29 LAB — VITAMIN B12: Vitamin B-12: 2205 pg/mL — ABNORMAL HIGH (ref 180–914)

## 2016-10-29 LAB — FOLATE: FOLATE: 26 ng/mL (ref >5.9)

## 2016-10-29 LAB — FERRITIN: FERRITIN: 830 ng/mL — AB (ref 11–307)

## 2016-10-29 MED ORDER — SODIUM CHLORIDE 0.9 % IV SOLN
Freq: Once | INTRAVENOUS | Status: AC
  Administered 2016-10-29: 11:00:00 via INTRAVENOUS
  Filled 2016-10-29: qty 5

## 2016-10-29 MED ORDER — PEGFILGRASTIM 6 MG/0.6ML ~~LOC~~ PSKT
6.0000 mg | PREFILLED_SYRINGE | Freq: Once | SUBCUTANEOUS | Status: AC
  Administered 2016-10-29: 6 mg via SUBCUTANEOUS
  Filled 2016-10-29: qty 0.6

## 2016-10-29 MED ORDER — HEPARIN SOD (PORK) LOCK FLUSH 100 UNIT/ML IV SOLN
500.0000 [IU] | Freq: Once | INTRAVENOUS | Status: AC
  Administered 2016-10-29: 500 [IU] via INTRAVENOUS
  Filled 2016-10-29: qty 5

## 2016-10-29 MED ORDER — PALONOSETRON HCL INJECTION 0.25 MG/5ML
0.2500 mg | Freq: Once | INTRAVENOUS | Status: AC
  Administered 2016-10-29: 0.25 mg via INTRAVENOUS
  Filled 2016-10-29: qty 5

## 2016-10-29 MED ORDER — SODIUM CHLORIDE 0.9% FLUSH
10.0000 mL | INTRAVENOUS | Status: DC | PRN
  Filled 2016-10-29: qty 10

## 2016-10-29 MED ORDER — HEPARIN SOD (PORK) LOCK FLUSH 100 UNIT/ML IV SOLN: 500.0000 [IU] | Freq: Once | INTRAVENOUS | Status: DC | PRN

## 2016-10-29 MED ORDER — DOXORUBICIN HCL CHEMO IV INJECTION 2 MG/ML
60.0000 mg/m2 | Freq: Once | INTRAVENOUS | Status: AC
  Administered 2016-10-29: 124 mg via INTRAVENOUS
  Filled 2016-10-29: qty 62

## 2016-10-29 MED ORDER — SODIUM CHLORIDE 0.9 % IV SOLN
Freq: Once | INTRAVENOUS | Status: AC
  Administered 2016-10-29: 11:00:00 via INTRAVENOUS
  Filled 2016-10-29: qty 1000

## 2016-10-29 MED ORDER — CYCLOPHOSPHAMIDE CHEMO INJECTION 1 GM
600.0000 mg/m2 | Freq: Once | INTRAMUSCULAR | Status: AC
  Administered 2016-10-29: 1240 mg via INTRAVENOUS
  Filled 2016-10-29: qty 12

## 2016-10-29 MED ORDER — SODIUM CHLORIDE 0.9% FLUSH
10.0000 mL | INTRAVENOUS | Status: DC | PRN
  Administered 2016-10-29: 10 mL via INTRAVENOUS
  Filled 2016-10-29: qty 10

## 2016-10-29 NOTE — Progress Notes (Signed)
Patient here for pre treament check. She states that since her last visit her vision is blurred and she has more "floaters". BP is elevated today patient states it is always high when she comes here.

## 2016-10-29 NOTE — Progress Notes (Signed)
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 Patient is a 76 year old female with no significant comorbidities who noticed left breast mass about 6-9 months ago. She thought it would go awaybut it continued to increase in size and began to involve her skin. She also noted tenderness to palpation as well as intermittent sharp tingling pain. She was seen by Dr. Adora Fridge on 08/25/2016 and patient had a biopsy of her skin in his office which showed:DIAGNOSIS:  A. LEFT BREAST SKIN; EXCISION:  - INVASIVE CARCINOMA MORPHOLOGICALLY CONSISTENT WITH MAMMARY ORIGIN  INVOLVING THE DERMIS.   BREAST BIOMARKER TESTS  Estrogen Receptor (ER) Status: POSITIVE, >90%  Progesterone Receptor (PgR) Status: POSITIVE, >70%  Her2 negative  2. Patient underwent bilateral diagnostic mammogram on 08/31/2016 showed:IMPRESSION: 1. Known left breast cancer, retroareolar with probable extension to the nipple, measuring 4.3 cm greatest dimension by ultrasound, with associated architectural distortion and associated left nipple retraction, and with associated diffuse skin thickening on the left. 2. No enlarged or morphologically abnormal lymph nodes are seen in the left axilla by ultrasound. 3. No evidence of malignancy within the right breast.  3. Patient was also complaining of some perirectal pain and underwent CT abdomen pelvis with contrast on 09/02/2016 which showed: IMPRESSION: Left colonic diverticulosis. No active diverticulitis.Fatty liver. No acute findings or evidence of metastatic disease in the  abdomen or pelvis. Diffuse bony sclerotic metastases.  4. Other than that her breast pain patient overall feels well. Denies any unintentional weight loss. She continues to be active and care for her great-grandchildren. She has had a hysterectomy in the past. No family history of breast or ovarian cancer.  5. Given that she had inflammatory breast cancer- plan was to give 4 cycles of dose dense AC followed by possible mastectomy and treat bone mets with AI + ibrance  6. PET CT scan on 09/11/16 showed: IMPRESSION: 1. Low-grade but abnormal hypermetabolic activity associated with the cutaneous and sub areolar glandular tissues of the left breast, compatible with malignancy, representative SUV 3.9 compared to contralateral normal side 1.4. 2. Diffuse sclerotic osseous metastatic disease with low-grade hypermetabolic activity, a representative SUV along the left sacrum 5.4. 3. Other imaging findings of potential clinical significance: Aortic Atherosclerosis (ICD10-I70.0). Descending and sigmoid colon Diverticulosis.  7. Baseline MUGA scan showed EF of 71%. Baseline CA 27.29 elevated at 563.2  8. Cycle 3 of AC was on hold because of a large buttock lipoma that needed excision  Interval history- feels much better after buttock lipoma was excised. Denies any complaints today  ECOG PS- 1 Pain scale- 0   Review of systems- Review of Systems  Constitutional: Negative for chills, fever, malaise/fatigue and weight loss.  HENT: Negative for congestion, ear discharge and nosebleeds.   Eyes: Negative for blurred vision.  Respiratory: Negative for cough, hemoptysis, sputum production, shortness of breath and wheezing.   Cardiovascular: Negative for chest pain, palpitations, orthopnea and claudication.  Gastrointestinal: Negative for abdominal pain, blood in stool, constipation, diarrhea, heartburn, melena, nausea and vomiting.  Genitourinary: Negative for dysuria, flank pain, frequency,  hematuria and urgency.  Musculoskeletal: Negative for back pain, joint pain  and myalgias.  Skin: Negative for rash.  Neurological: Negative for dizziness, tingling, focal weakness, seizures, weakness and headaches.  Endo/Heme/Allergies: Does not bruise/bleed easily.  Psychiatric/Behavioral: Negative for depression and suicidal ideas. The patient does not have insomnia.      No Known Allergies   Past Medical History:  Diagnosis Date  . Cancer ([place])    left breast  . Hypertension   . Port catheter in place 09/14/2016   Placed 09/10/2016 RT chest.     Past Surgical History:  Procedure Laterality Date  . ABDOMINAL HYSTERECTOMY    . APPENDECTOMY    . PORTACATH PLACEMENT Right 09/10/2016   Procedure: INSERTION PORT-A-CATH;  Surgeon: Jules Husbands, MD;  Location: [facility] ORS;  Service: General;  Laterality: Right;    Social History   Social History  . Marital status: Married    Spouse name: N/A  . Number of children: N/A  . Years of education: N/A   Occupational History  . Not on file.   Social History Main Topics  . Smoking status: Never Smoker  . Smokeless tobacco: Never Used  . Alcohol use No  . Drug use: No  . Sexual activity: No   Other Topics Concern  . Not on file   Social History Narrative  . No narrative on file    Family History  Problem Relation Age of Onset  . Parkinson's disease Mother   . Cancer Father      Current Outpatient Prescriptions:  .  acetaminophen (TYLENOL) 500 MG tablet, Take 1,000 mg by mouth every 6 (six) hours as needed for moderate pain., Disp: , Rfl:  .  amLODipine (NORVASC) 5 MG tablet, Take 1 tablet (5 mg total) by mouth daily., Disp: 30 tablet, Rfl: 0 .  dexamethasone (DECADRON) 4 MG tablet, Take 2 tablets by mouth once a day on the day after chemotherapy and then take 2 tablets two times a day for 2 days. Take with food. (Patient taking differently: Take 2 tablets by mouth once a day on the day after chemotherapy and then take 2  tablets two times a day for 2 days. Take with food.), Disp: 30 tablet, Rfl: 1 .  diphenoxylate-atropine (LOMOTIL) 2.5-0.025 MG tablet, Take 1 tablet by mouth 4 (four) times daily as needed for diarrhea or loose stools., Disp: 30 tablet, Rfl: 0 .  hydrochlorothiazide (MICROZIDE) 12.5 MG capsule, , Disp: , Rfl:  .  lidocaine-prilocaine (EMLA) cream, Apply to affected area once, Disp: 30 g, Rfl: 3 .  LORazepam (ATIVAN) 0.5 MG tablet, Take 1 tablet (0.5 mg total) by mouth every 6 (six) hours as needed (Nausea or vomiting). (Patient taking differently: Take 0.5 mg by mouth every 6 (six) hours as needed. Nausea or vomiting), Disp: 30 tablet, Rfl: 0 .  ondansetron (ZOFRAN) 8 MG tablet, Take 1 tablet (8 mg total) by mouth 2 (two) times daily as needed. Start on the third day after chemotherapy. (Patient taking differently: Take 8 mg by mouth 2 (two) times daily as needed for nausea. Start on the third day after chemotherapy.), Disp: 30 tablet, Rfl: 1 .  palbociclib (IBRANCE) 125 MG capsule, Take 1 capsule (125 mg total) by mouth daily with breakfast. Take whole with food. 3 weeks on 1 week off (Patient not taking: Reported on 10/23/2016), Disp: 21 capsule, Rfl: 5 .  prochlorperazine (COMPAZINE) 10 MG tablet, Take 1 tablet (10 mg total) by mouth every 6 (six) hours as needed (Nausea or vomiting). (Patient taking differently: Take 10 mg by  mouth every 6 (six) hours as needed for nausea or vomiting. ), Disp: 30 tablet, Rfl: 1 No current facility-administered medications for this visit.   Facility-Administered Medications Ordered in Other Visits:  .  heparin lock flush 100 unit/mL, 500 Units, Intravenous, Once, Sindy Guadeloupe, MD .  sodium chloride flush (NS) 0.9 % injection 10 mL, 10 mL, Intravenous, PRN, Sindy Guadeloupe, MD, 10 mL at 10/29/16 0920  Physical exam:  Vitals:   10/29/16 0946  BP: (!) 173/86  Pulse: 90  Resp: 16  Temp: 98 F (36.7 C)  TempSrc: Tympanic  Weight: 202 lb 6.4 oz (91.8 kg)    Height: 5' 2" (1.575 m)   Physical Exam  Constitutional: She is oriented to person, place, and time and well-developed, well-nourished, and in no distress.  HENT:  Head: Normocephalic and atraumatic.  Eyes: Pupils are equal, round, and reactive to light. EOM are normal.  Neck: Normal range of motion.  Cardiovascular: Normal rate, regular rhythm and normal heart sounds.   Pulmonary/Chest: Effort normal and breath sounds normal.  Abdominal: Soft. Bowel sounds are normal.  1cm oval wound seen over right buttock. Base has healthy granulation tissue. No infection  Neurological: She is alert and oriented to person, place, and time.  Skin: Skin is warm and dry.   ill defined mass in the left breast somewhat the same size as prior exam. No open areas or ulceration   CMP Latest Ref Rng & Units 10/15/2016  Glucose 65 - 99 mg/dL 99  BUN 6 - 20 mg/dL 8  Creatinine 0.44 - 1.00 mg/dL 0.62  Sodium 135 - 145 mmol/L 138  Potassium 3.5 - 5.1 mmol/L 3.4(L)  Chloride 101 - 111 mmol/L 105  CO2 22 - 32 mmol/L 25  Calcium 8.9 - 10.3 mg/dL 8.7(L)  Total Protein 6.5 - 8.1 g/dL 6.3(L)  Total Bilirubin 0.3 - 1.2 mg/dL 0.4  Alkaline Phos 38 - 126 U/L 137(H)  AST 15 - 41 U/L 32  ALT 14 - 54 U/L 22   CBC Latest Ref Rng & Units 10/29/2016  WBC 3.6 - 11.0 K/uL 4.4  Hemoglobin 12.0 - 16.0 g/dL 11.1(L)  Hematocrit 35.0 - 47.0 % 31.8(L)  Platelets 150 - 440 K/uL 208    No images are attached to the encounter.  Dg Chest 2 View  Result Date: 10/15/2016 CLINICAL DATA:  76 year old female with a history of breast cancer and planned surgical excision of a bile duct mass. Preoperative chest x-ray. EXAM: CHEST  2 VIEW COMPARISON:  Prior chest x-ray 10/10/2016 FINDINGS: Right IJ single-lumen power injectable port catheter. The catheter tip terminates in the mid SVC. Stable cardiac and mediastinal contours. No overt pulmonary edema, new airspace consolidation, pleural effusion or pneumothorax. Diffusely mottled  appearance to the bony structures consistent with known osseous metastatic disease. Left basilar opacity consistent with prominent pericardial fat pad. IMPRESSION: No active cardiopulmonary disease. Electronically Signed   By: Jacqulynn Cadet M.D.   On: 10/15/2016 14:13   Dg Chest 2 View  Result Date: 10/10/2016 CLINICAL DATA:  Metastatic breast cancer.  Neutropenia.  Fever. EXAM: CHEST  2 VIEW COMPARISON:  10/09/2016.  09/10/2016.  08/28/2016. FINDINGS: Artifact overlies chest. Heart size is normal. Power port on the right inserted from an internal jugular approach has its tip in the SVC 3 cm above the right atrium. The right lung is clear. There is abnormal density at the left cardiophrenic angle. This could be due to volume loss or pneumonia. In this setting,  lingular pneumonia is the concern. IMPRESSION: Worsening density at the left cardiophrenic angle worrisome for lingular pneumonia. Electronically Signed   By: Nelson Chimes M.D.   On: 10/10/2016 10:09   Dg Chest 2 View  Result Date: 10/09/2016 CLINICAL DATA:  Metastatic breast cancer with chemotherapy and fever. EXAM: CHEST  2 VIEW COMPARISON:  Sep 10, 2016 FINDINGS: A right Port-A-Cath is in stable position. No pneumothorax. Cardiomegaly persists. The hila and mediastinum are normal. No pulmonary nodules or masses are seen. No focal infiltrates. No change in the bones. IMPRESSION: No cause for fever identified. Electronically Signed   By: Dorise Bullion III M.D   On: 10/09/2016 13:57     Assessment and plan- Patient is a 76 y.o. female with Stage IV left breast cancer with bone only metastases s/p 2 cycles of dose dense AC  Counts ok to proceed with cycle 3 of AC with neulasta support today. rtc in 3 weeks with labs for cycle 4 of AC. After that I will switch her to ibrance and letrozole. We are working on Biochemist, clinical for the same. Mass iss hrinking slowly. Tumor markers remain elevated. Will plan for toilet mastectomy down the line  if it responds well to ibrance  Bone mets- Patient will see dentist within 1 week and once we get clearance for zometa we will hopefully start in 3 weeks time  Buttock wound- monitor closely for any signs of infection   Visit Diagnosis 1. Malignant neoplasm of left breast in female, estrogen receptor positive, unspecified site of breast ([place])   2. Encounter for antineoplastic chemotherapy      Dr. Randa Evens, MD, MPH [facility] Pager- [phone] 10/29/2016 10:17 AM

## 2016-10-29 NOTE — Telephone Encounter (Signed)
Per Myra/lab, AC with Zometa duration time is 3 1/2 hrs.

## 2016-10-30 LAB — CANCER ANTIGEN 27.29: CA 27.29: 739.8 U/mL — ABNORMAL HIGH (ref 0.0–38.6)

## 2016-11-09 ENCOUNTER — Other Ambulatory Visit: Payer: Self-pay | Admitting: *Deleted

## 2016-11-09 ENCOUNTER — Telehealth: Payer: Self-pay | Admitting: *Deleted

## 2016-11-09 DIAGNOSIS — C50912 Malignant neoplasm of unspecified site of left female breast: Secondary | ICD-10-CM

## 2016-11-09 DIAGNOSIS — C7951 Secondary malignant neoplasm of bone: Secondary | ICD-10-CM

## 2016-11-09 MED ORDER — PROCHLORPERAZINE MALEATE 10 MG PO TABS: 10.0000 mg | ORAL_TABLET | Freq: Four times a day (QID) | ORAL | 1 refills | Status: DC | PRN

## 2016-11-09 NOTE — Telephone Encounter (Signed)
Pt called to say that she needs refill of norvasc and prochloraperazine.  I told her that I will check with Dr.Rao about the b/p med. She was started on it in hospital.  The nausea med is ok to refill. I sent it.  I told her that we got copay grant for 5,000 dollars for her [facility] and she had got a call from [facility] to deliver med and she told them that she still had 1 more chemo left first.  I called [facility] today and spoke to [facility] and let her know the last dose of chemo will be 8/9 so if they can ship med. 8/10 that would be great to get pt started.  I also told pt that she will need to go see Dr. Ulyses Jarred about her nerves under her crown before we can give her the zometa and she had got a call from dentist office explaining same thing. She is trying to make appt in between the ones she already has.  I told her I will check back about the norvasc and she has 3 more pills left so it will be ok.

## 2016-11-10 ENCOUNTER — Other Ambulatory Visit: Payer: Self-pay | Admitting: *Deleted

## 2016-11-10 MED ORDER — AMLODIPINE BESYLATE 5 MG PO TABS: 5.0000 mg | ORAL_TABLET | Freq: Every day | ORAL | 0 refills | Status: DC

## 2016-11-10 NOTE — Telephone Encounter (Signed)
I called pt and let her know that we will fill it for 1 month and asked if she had PCP and she sees Abby bender. I will send her a message about future refills.

## 2016-11-10 NOTE — Telephone Encounter (Signed)
Does she have pcp? We can refill once and then have pcp take over. She should wait to start ibrance 2 weeks after last cycle of AC. She needs cbc checked before starting

## 2016-11-12 ENCOUNTER — Ambulatory Visit: Payer: Medicare Other

## 2016-11-12 ENCOUNTER — Other Ambulatory Visit: Payer: Medicare Other

## 2016-11-12 ENCOUNTER — Ambulatory Visit: Payer: Medicare Other | Admitting: Oncology

## 2016-11-19 ENCOUNTER — Encounter: Payer: Self-pay | Admitting: Oncology

## 2016-11-19 ENCOUNTER — Inpatient Hospital Stay: Payer: Medicare Other

## 2016-11-19 ENCOUNTER — Inpatient Hospital Stay: Payer: Medicare Other | Attending: Oncology | Admitting: Oncology

## 2016-11-19 VITALS — BP 148/87 | HR 88 | Temp 98.2°F | Resp 18 | Ht 62.0 in | Wt 201.0 lb

## 2016-11-19 DIAGNOSIS — C7951 Secondary malignant neoplasm of bone: Secondary | ICD-10-CM | POA: Insufficient documentation

## 2016-11-19 DIAGNOSIS — Z7689 Persons encountering health services in other specified circumstances: Secondary | ICD-10-CM | POA: Insufficient documentation

## 2016-11-19 DIAGNOSIS — K6289 Other specified diseases of anus and rectum: Secondary | ICD-10-CM | POA: Diagnosis not present

## 2016-11-19 DIAGNOSIS — Z170 Estrogen receptor positive status [ER+]: Secondary | ICD-10-CM | POA: Diagnosis not present

## 2016-11-19 DIAGNOSIS — C50912 Malignant neoplasm of unspecified site of left female breast: Secondary | ICD-10-CM

## 2016-11-19 DIAGNOSIS — Z90710 Acquired absence of both cervix and uterus: Secondary | ICD-10-CM | POA: Diagnosis not present

## 2016-11-19 DIAGNOSIS — N644 Mastodynia: Secondary | ICD-10-CM | POA: Insufficient documentation

## 2016-11-19 DIAGNOSIS — Z79899 Other long term (current) drug therapy: Secondary | ICD-10-CM | POA: Diagnosis not present

## 2016-11-19 DIAGNOSIS — Z5111 Encounter for antineoplastic chemotherapy: Secondary | ICD-10-CM | POA: Diagnosis present

## 2016-11-19 DIAGNOSIS — K760 Fatty (change of) liver, not elsewhere classified: Secondary | ICD-10-CM | POA: Diagnosis not present

## 2016-11-19 DIAGNOSIS — I10 Essential (primary) hypertension: Secondary | ICD-10-CM | POA: Insufficient documentation

## 2016-11-19 LAB — COMPREHENSIVE METABOLIC PANEL
ALBUMIN: 3.8 g/dL (ref 3.5–5.0)
ALT: 38 U/L (ref 14–54)
AST: 48 U/L — AB (ref 15–41)
Alkaline Phosphatase: 173 U/L — ABNORMAL HIGH (ref 38–126)
Anion gap: 9 (ref 5–15)
BUN: 9 mg/dL (ref 6–20)
CHLORIDE: 106 mmol/L (ref 101–111)
CO2: 23 mmol/L (ref 22–32)
CREATININE: 0.57 mg/dL (ref 0.44–1.00)
Calcium: 9.2 mg/dL (ref 8.9–10.3)
GFR calc non Af Amer: >60 mL/min (ref >60)
GLUCOSE: 132 mg/dL — AB (ref 65–99)
Potassium: 3.6 mmol/L (ref 3.5–5.1)
SODIUM: 138 mmol/L (ref 135–145)
Total Bilirubin: 0.7 mg/dL (ref 0.3–1.2)
Total Protein: 6.5 g/dL (ref 6.5–8.1)

## 2016-11-19 LAB — CBC WITH DIFFERENTIAL/PLATELET
BASOS ABS: 0 10*3/uL (ref 0–0.1)
Basophils Relative: 1 %
EOS ABS: 0 10*3/uL (ref 0–0.7)
EOS PCT: 1 %
HCT: 27.3 % — ABNORMAL LOW (ref 35.0–47.0)
Hemoglobin: 9.5 g/dL — ABNORMAL LOW (ref 12.0–16.0)
Lymphocytes Relative: 16 %
Lymphs Abs: 0.6 10*3/uL — ABNORMAL LOW (ref 1.0–3.6)
MCH: 31.9 pg (ref 26.0–34.0)
MCHC: 34.9 g/dL (ref 32.0–36.0)
MCV: 91.5 fL (ref 80.0–100.0)
Monocytes Absolute: 0.4 10*3/uL (ref 0.2–0.9)
Monocytes Relative: 12 %
Neutro Abs: 2.5 10*3/uL (ref 1.4–6.5)
Neutrophils Relative %: 70 %
PLATELETS: 222 10*3/uL (ref 150–440)
RBC: 2.98 MIL/uL — AB (ref 3.80–5.20)
RDW: 18.8 % — ABNORMAL HIGH (ref 11.5–14.5)
WBC: 3.6 10*3/uL (ref 3.6–11.0)

## 2016-11-19 MED ORDER — HEPARIN SOD (PORK) LOCK FLUSH 100 UNIT/ML IV SOLN
500.0000 [IU] | Freq: Once | INTRAVENOUS | Status: AC
  Administered 2016-11-19: 500 [IU] via INTRAVENOUS

## 2016-11-19 MED ORDER — SODIUM CHLORIDE 0.9 % IV SOLN
600.0000 mg/m2 | Freq: Once | INTRAVENOUS | Status: AC
  Administered 2016-11-19: 1240 mg via INTRAVENOUS
  Filled 2016-11-19: qty 12

## 2016-11-19 MED ORDER — FOSAPREPITANT DIMEGLUMINE INJECTION 150 MG
Freq: Once | INTRAVENOUS | Status: AC
  Administered 2016-11-19: 11:00:00 via INTRAVENOUS
  Filled 2016-11-19: qty 5

## 2016-11-19 MED ORDER — SODIUM CHLORIDE 0.9 % IV SOLN
Freq: Once | INTRAVENOUS | Status: AC
  Administered 2016-11-19: 11:00:00 via INTRAVENOUS
  Filled 2016-11-19: qty 1000

## 2016-11-19 MED ORDER — DOXORUBICIN HCL CHEMO IV INJECTION 2 MG/ML
60.0000 mg/m2 | Freq: Once | INTRAVENOUS | Status: AC
  Administered 2016-11-19: 124 mg via INTRAVENOUS
  Filled 2016-11-19: qty 62

## 2016-11-19 MED ORDER — PEGFILGRASTIM 6 MG/0.6ML ~~LOC~~ PSKT
6.0000 mg | PREFILLED_SYRINGE | Freq: Once | SUBCUTANEOUS | Status: AC
  Administered 2016-11-19: 6 mg via SUBCUTANEOUS
  Filled 2016-11-19: qty 0.6

## 2016-11-19 MED ORDER — SODIUM CHLORIDE 0.9% FLUSH
10.0000 mL | INTRAVENOUS | Status: DC | PRN
Start: 2016-11-19 — End: 2016-11-19
  Administered 2016-11-19: 10 mL via INTRAVENOUS
  Filled 2016-11-19: qty 10

## 2016-11-19 MED ORDER — PALONOSETRON HCL INJECTION 0.25 MG/5ML
0.2500 mg | Freq: Once | INTRAVENOUS | Status: AC
Start: 2016-11-19 — End: 2016-11-19
  Administered 2016-11-19: 0.25 mg via INTRAVENOUS
  Filled 2016-11-19: qty 5

## 2016-11-19 NOTE — Progress Notes (Signed)
Hematology/Oncology Consult note [facility]  [phone] Fax:([phone]  Patient Care Team: Letta Median, MD as PCP - General (Family Medicine)   Name of the patient: Victoria Steele  [id number]  1940-07-30   Date of visit: 11/19/16  Diagnosis- Stage IV invasive mammary carcinoma [id number] with bone only metastases  Chief complaint/ Reason for visit- post hospital discharge f/u  Heme/Onc history:1. Patient is a 76 year old female with no significant comorbidities who noticed left breast mass about 6-9 months ago. She thought it would go awaybut it continued to increase in size and began to involve her skin. She also noted tenderness to palpation as well as intermittent sharp tingling pain. She was seen by Dr. Adora Fridge on 08/25/2016 and patient had a biopsy of her skin in his office which showed:DIAGNOSIS:  A. LEFT BREAST SKIN; EXCISION:  - INVASIVE CARCINOMA MORPHOLOGICALLY CONSISTENT WITH MAMMARY ORIGIN  INVOLVING THE DERMIS.   BREAST BIOMARKER TESTS  Estrogen Receptor (ER) Status: POSITIVE, >90%  Progesterone Receptor (PgR) Status: POSITIVE, >70%  Her2 negative  2. Patient underwent bilateral diagnostic mammogram on 08/31/2016 showed:IMPRESSION: 1. Known left breast cancer, retroareolar with probable extension to the nipple, measuring 4.3 cm greatest dimension by ultrasound, with associated architectural distortion and associated left nipple retraction, and with associated diffuse skin thickening on the left. 2. No enlarged or morphologically abnormal lymph nodes are seen in the left axilla by ultrasound. 3. No evidence of malignancy within the right breast.  3. Patient was also complaining of some perirectal pain and underwent CT abdomen pelvis with contrast on 09/02/2016 which showed: IMPRESSION: Left colonic diverticulosis. No active diverticulitis.Fatty liver. No acute findings or evidence of metastatic disease in the  abdomen or pelvis. Diffuse bony sclerotic metastases.  4. Other than that her breast pain patient overall feels well. Denies any unintentional weight loss. She continues to be active and care for her great-grandchildren. She has had a hysterectomy in the past. No family history of breast or ovarian cancer.  5. Given that she had inflammatory breast cancer- plan was to give 4 cycles of dose dense AC followed by possible mastectomy and treat bone mets with AI + ibrance  6. PET CT scan on 09/11/16 showed: IMPRESSION: 1. Low-grade but abnormal hypermetabolic activity associated with the cutaneous and sub areolar glandular tissues of the left breast, compatible with malignancy, representative SUV 3.9 compared to contralateral normal side 1.4. 2. Diffuse sclerotic osseous metastatic disease with low-grade hypermetabolic activity, a representative SUV along the left sacrum 5.4. 3. Other imaging findings of potential clinical significance: Aortic Atherosclerosis (ICD10-I70.0). Descending and sigmoid colon Diverticulosis.  7. Baseline MUGA scan showed EF of 71%. Baseline CA 27.29 elevated at 563.2  8. 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   Interval history- she is undergoing dental work in 2 weeks. Otherwise denies other complaints  ECOG PS- 0 Pain scale- 0  Review of systems- Review of Systems  Constitutional: Negative for chills, fever, malaise/fatigue and weight loss.  HENT: Negative for congestion, ear discharge and nosebleeds.   Eyes: Negative for blurred vision.  Respiratory: Negative for cough, hemoptysis, sputum production, shortness of breath and wheezing.   Cardiovascular: Negative for chest pain, palpitations, orthopnea and claudication.  Gastrointestinal: Negative for abdominal pain, blood in stool, constipation, diarrhea, heartburn, melena, nausea and vomiting.  Genitourinary: Negative for dysuria, flank pain, frequency, hematuria and urgency.    Musculoskeletal: Negative for back pain, joint pain and myalgias.  Skin: Negative for rash.  Neurological: Negative for dizziness, tingling, focal weakness, seizures, weakness and headaches.  Endo/Heme/Allergies: Does not bruise/bleed easily.  Psychiatric/Behavioral: Negative for depression and suicidal ideas. The patient does not have insomnia.       No Known Allergies   Past Medical History:  Diagnosis Date  . Cancer ([place])    left breast  . Hypertension   . Port catheter in place 09/14/2016   Placed 09/10/2016 RT chest.     Past Surgical History:  Procedure Laterality Date  . ABDOMINAL HYSTERECTOMY    . APPENDECTOMY    . PORTACATH PLACEMENT Right 09/10/2016   Procedure: INSERTION PORT-A-CATH;  Surgeon: Jules Husbands, MD;  Location: [facility] ORS;  Service: General;  Laterality: Right;    Social History   Social History  . Marital status: Married    Spouse name: N/A  . Number of children: N/A  . Years of education: N/A   Occupational History  . Not on file.   Social History Main Topics  . Smoking status: Never Smoker  . Smokeless tobacco: Never Used  . Alcohol use No  . Drug use: No  . Sexual activity: No   Other Topics Concern  . Not on file   Social History Narrative  . No narrative on file    Family History  Problem Relation Age of Onset  . Parkinson's disease Mother   . Cancer Father      Current Outpatient Prescriptions:  .  acetaminophen (TYLENOL) 500 MG tablet, Take 1,000 mg by mouth every 6 (six) hours as needed for moderate pain., Disp: , Rfl:  .  amLODipine (NORVASC) 5 MG tablet, Take 1 tablet (5 mg total) by mouth daily., Disp: 30 tablet, Rfl: 0 .  dexamethasone (DECADRON) 4 MG tablet, Take 2 tablets by mouth once a day on the day after chemotherapy and then take 2 tablets two times a day for 2 days. Take with food. (Patient taking differently: Take 2 tablets by mouth once a day on the day after chemotherapy and then take 2 tablets two times a  day for 2 days. Take with food.), Disp: 30 tablet, Rfl: 1 .  diphenoxylate-atropine (LOMOTIL) 2.5-0.025 MG tablet, Take 1 tablet by mouth 4 (four) times daily as needed for diarrhea or loose stools., Disp: 30 tablet, Rfl: 0 .  hydrochlorothiazide (MICROZIDE) 12.5 MG capsule, , Disp: , Rfl:  .  lidocaine-prilocaine (EMLA) cream, Apply to affected area once, Disp: 30 g, Rfl: 3 .  LORazepam (ATIVAN) 0.5 MG tablet, Take 1 tablet (0.5 mg total) by mouth every 6 (six) hours as needed (Nausea or vomiting). (Patient taking differently: Take 0.5 mg by mouth every 6 (six) hours as needed. Nausea or vomiting), Disp: 30 tablet, Rfl: 0 .  ondansetron (ZOFRAN) 8 MG tablet, Take 1 tablet (8 mg total) by mouth 2 (two) times daily as needed. Start on the third day after chemotherapy. (Patient taking differently: Take 8 mg by mouth 2 (two) times daily as needed for nausea. Start on the third day after chemotherapy.), Disp: 30 tablet, Rfl: 1 .  palbociclib (IBRANCE) 125 MG capsule, Take 1 capsule (125 mg total) by mouth daily with breakfast. Take whole with food. 3 weeks on 1 week off, Disp: 21 capsule, Rfl: 5 .  prochlorperazine (COMPAZINE) 10 MG tablet, Take 1 tablet (10 mg total) by mouth every 6 (six) hours as needed (Nausea or vomiting)., Disp: 30 tablet, Rfl: 1  Physical exam:  Vitals:   11/19/16 0957  BP: (!) 148/87  Pulse:  88  Resp: 18  Temp: 98.2 F (36.8 C)  TempSrc: Tympanic  Weight: 201 lb (91.2 kg)  Height: '5\' 2"'$  (1.575 m)   Physical Exam  Constitutional: She is oriented to person, place, and time and well-developed, well-nourished, and in no distress.  HENT:  Head: Normocephalic and atraumatic.  Eyes: Pupils are equal, round, and reactive to light. EOM are normal.  Neck: Normal range of motion.  Cardiovascular: Normal rate, regular rhythm and normal heart sounds.   Pulmonary/Chest: Effort normal and breath sounds normal.  Abdominal: Soft. Bowel sounds are normal.  Buttock wound has healed  well. No open areas  Neurological: She is alert and oriented to person, place, and time.  Skin: Skin is warm and dry.   left breast mass measures about 5 cm in size. It does appear 20-30% smaller since initial exam  CMP Latest Ref Rng & Units 11/19/2016  Glucose 65 - 99 mg/dL 132(H)  BUN 6 - 20 mg/dL 9  Creatinine 0.44 - 1.00 mg/dL 0.57  Sodium 135 - 145 mmol/L 138  Potassium 3.5 - 5.1 mmol/L 3.6  Chloride 101 - 111 mmol/L 106  CO2 22 - 32 mmol/L 23  Calcium 8.9 - 10.3 mg/dL 9.2  Total Protein 6.5 - 8.1 g/dL 6.5  Total Bilirubin 0.3 - 1.2 mg/dL 0.7  Alkaline Phos 38 - 126 U/L 173(H)  AST 15 - 41 U/L 48(H)  ALT 14 - 54 U/L 38   CBC Latest Ref Rng & Units 11/19/2016  WBC 3.6 - 11.0 K/uL 3.6  Hemoglobin 12.0 - 16.0 g/dL 9.5(L)  Hematocrit 35.0 - 47.0 % 27.3(L)  Platelets 150 - 440 K/uL 222      Assessment and plan- Patient is a 76 y.o. female with Stage IV left breast cancer with bone only metastases s/p 3 cycles of dose dense AC  Counts ok to proceed with cycle 4 of AC chemotherapy today. Clinically her breast mass appears smaller although not significant decrease in size. CA 27/29 has gone up. I will switch her to ibrance 125 mg daily 3 weeks on and 1 week of 2 weeks from now after checking repeat cbc. She will also be starting letrozole. As long as she is not neutorpnic it would be ok to start ibrance at that time.   I discussed risks and benefits of ibrance including all but not limited to fatigue, nausea, mouth sores, risk of low blood counts, diarrhea and abnormal liver functions. Patient understands and agrees to proceed as planned. She will be attending class to discuss side effects of ibrance in the next week  Also discussed risks and benefits of letrozole including all but not limited to fatigue, arthraligias and worsening bone health. Bone density scan ahs been scheduled. She will take ca 1200 gm and vit D 800 IU while on letrozole  I will see her back in 6 weeks with cbc,  cmp, ca 27.29 to see how she tolerated her ibrance. Also gets possible zometa. She will have cbc, cmp checked at 2 weeks. She will call us sooner if she has any worsening side effects from ibrance  Still awaiting dental clearance to start zometa for bone mets. She is due for antoher dental procedure in 2 weeks    Visit Diagnosis 1. Malignant neoplasm of left breast in female, estrogen receptor positive, unspecified site of breast ([place])   2. Bone metastases ([place])   3. Encounter for antineoplastic chemotherapy      Dr. Randa Evens, MD, MPH [place] at  [facility] Pager- [phone] 11/19/2016 10:19 AM

## 2016-11-19 NOTE — Progress Notes (Signed)
Repair under crown 8/6, she has another one to do in 2 weeks. No c/o today.

## 2016-11-20 LAB — CANCER ANTIGEN 27.29: CAN 27.29: 582.1 U/mL — AB (ref 0.0–38.6)

## 2016-12-01 ENCOUNTER — Ambulatory Visit
Admission: RE | Admit: 2016-12-01 | Discharge: 2016-12-01 | Disposition: A | Discharge status: Home / Self Care | Payer: Medicare Other | Source: Ambulatory Visit | Attending: Oncology | Admitting: Oncology

## 2016-12-01 ENCOUNTER — Inpatient Hospital Stay: Payer: Medicare Other

## 2016-12-01 ENCOUNTER — Telehealth: Payer: Self-pay | Admitting: *Deleted

## 2016-12-01 DIAGNOSIS — Z170 Estrogen receptor positive status [ER+]: Secondary | ICD-10-CM | POA: Insufficient documentation

## 2016-12-01 DIAGNOSIS — Z9221 Personal history of antineoplastic chemotherapy: Secondary | ICD-10-CM | POA: Insufficient documentation

## 2016-12-01 DIAGNOSIS — C7951 Secondary malignant neoplasm of bone: Secondary | ICD-10-CM | POA: Diagnosis not present

## 2016-12-01 DIAGNOSIS — C50912 Malignant neoplasm of unspecified site of left female breast: Secondary | ICD-10-CM | POA: Insufficient documentation

## 2016-12-01 DIAGNOSIS — Z780 Asymptomatic menopausal state: Secondary | ICD-10-CM | POA: Insufficient documentation

## 2016-12-01 NOTE — Telephone Encounter (Signed)
Called pt and let her know that if her labs on this Thursday are normal then she will start the ibrance

## 2016-12-03 ENCOUNTER — Inpatient Hospital Stay: Payer: Medicare Other

## 2016-12-03 DIAGNOSIS — Z5111 Encounter for antineoplastic chemotherapy: Secondary | ICD-10-CM | POA: Diagnosis not present

## 2016-12-03 DIAGNOSIS — Z170 Estrogen receptor positive status [ER+]: Principal | ICD-10-CM

## 2016-12-03 DIAGNOSIS — C50912 Malignant neoplasm of unspecified site of left female breast: Secondary | ICD-10-CM

## 2016-12-03 LAB — CBC WITH DIFFERENTIAL/PLATELET
BASOS ABS: 0.1 10*3/uL (ref 0–0.1)
Basophils Relative: 1 %
Eosinophils Absolute: 0 10*3/uL (ref 0–0.7)
Eosinophils Relative: 0 %
HEMATOCRIT: 25.3 % — AB (ref 35.0–47.0)
HEMOGLOBIN: 8.8 g/dL — AB (ref 12.0–16.0)
LYMPHS PCT: 5 %
Lymphs Abs: 0.3 10*3/uL — ABNORMAL LOW (ref 1.0–3.6)
MCH: 32.8 pg (ref 26.0–34.0)
MCHC: 34.9 g/dL (ref 32.0–36.0)
MCV: 93.8 fL (ref 80.0–100.0)
MONO ABS: 0.4 10*3/uL (ref 0.2–0.9)
Monocytes Relative: 6 %
Neutro Abs: 5.6 10*3/uL (ref 1.4–6.5)
Neutrophils Relative %: 88 %
Platelets: 161 10*3/uL (ref 150–440)
RBC: 2.7 MIL/uL — ABNORMAL LOW (ref 3.80–5.20)
RDW: 20.4 % — AB (ref 11.5–14.5)
WBC: 6.4 10*3/uL (ref 3.6–11.0)

## 2016-12-03 LAB — COMPREHENSIVE METABOLIC PANEL
ALBUMIN: 3.8 g/dL (ref 3.5–5.0)
ALK PHOS: 143 U/L — AB (ref 38–126)
ALT: 21 U/L (ref 14–54)
AST: 32 U/L (ref 15–41)
Anion gap: 8 (ref 5–15)
BILIRUBIN TOTAL: 0.7 mg/dL (ref 0.3–1.2)
BUN: 6 mg/dL (ref 6–20)
CALCIUM: 9.1 mg/dL (ref 8.9–10.3)
CO2: 26 mmol/L (ref 22–32)
CREATININE: 0.48 mg/dL (ref 0.44–1.00)
Chloride: 106 mmol/L (ref 101–111)
GFR calc Af Amer: >60 mL/min (ref >60)
GLUCOSE: 110 mg/dL — AB (ref 65–99)
POTASSIUM: 3.5 mmol/L (ref 3.5–5.1)
Sodium: 140 mmol/L (ref 135–145)
TOTAL PROTEIN: 6.5 g/dL (ref 6.5–8.1)

## 2016-12-04 ENCOUNTER — Other Ambulatory Visit: Payer: Self-pay | Admitting: *Deleted

## 2016-12-04 ENCOUNTER — Telehealth: Payer: Self-pay | Admitting: *Deleted

## 2016-12-04 DIAGNOSIS — Z170 Estrogen receptor positive status [ER+]: Principal | ICD-10-CM

## 2016-12-04 DIAGNOSIS — C50912 Malignant neoplasm of unspecified site of left female breast: Secondary | ICD-10-CM

## 2016-12-04 DIAGNOSIS — C7951 Secondary malignant neoplasm of bone: Secondary | ICD-10-CM

## 2016-12-04 NOTE — Telephone Encounter (Signed)
Called pt after speaking to Dr Mike Gip on call and she said to hold starting the ibrance.  Your wbc is normal but hgb 8.8 and she is concerned about hgb dropping while on the ibrance and she did not want to start her yet.  Check labs next week 8/30 at 10:30. Pt agreeable and knows to hold off starting on Ibrance.

## 2016-12-10 ENCOUNTER — Telehealth: Payer: Self-pay

## 2016-12-10 ENCOUNTER — Inpatient Hospital Stay: Payer: Medicare Other

## 2016-12-10 DIAGNOSIS — C7951 Secondary malignant neoplasm of bone: Secondary | ICD-10-CM

## 2016-12-10 DIAGNOSIS — Z170 Estrogen receptor positive status [ER+]: Principal | ICD-10-CM

## 2016-12-10 DIAGNOSIS — Z5111 Encounter for antineoplastic chemotherapy: Secondary | ICD-10-CM | POA: Diagnosis not present

## 2016-12-10 DIAGNOSIS — C50912 Malignant neoplasm of unspecified site of left female breast: Secondary | ICD-10-CM

## 2016-12-10 LAB — CBC WITH DIFFERENTIAL/PLATELET
Basophils Absolute: 0.1 10*3/uL (ref 0–0.1)
Basophils Relative: 1 %
Eosinophils Absolute: 0 10*3/uL (ref 0–0.7)
Eosinophils Relative: 0 %
HCT: 27.7 % — ABNORMAL LOW (ref 35.0–47.0)
Hemoglobin: 9.5 g/dL — ABNORMAL LOW (ref 12.0–16.0)
Lymphocytes Relative: 16 %
Lymphs Abs: 0.7 10*3/uL — ABNORMAL LOW (ref 1.0–3.6)
MCH: 32.5 pg (ref 26.0–34.0)
MCHC: 34.2 g/dL (ref 32.0–36.0)
MCV: 95 fL (ref 80.0–100.0)
Monocytes Absolute: 0.7 10*3/uL (ref 0.2–0.9)
Monocytes Relative: 15 %
Neutro Abs: 3 10*3/uL (ref 1.4–6.5)
Neutrophils Relative %: 68 %
Platelets: 216 10*3/uL (ref 150–440)
RBC: 2.92 MIL/uL — ABNORMAL LOW (ref 3.80–5.20)
RDW: 21.3 % — ABNORMAL HIGH (ref 11.5–14.5)
WBC: 4.4 10*3/uL (ref 3.6–11.0)

## 2016-12-10 NOTE — Telephone Encounter (Signed)
Call to pt at home. She stated she will be having dental work 12/17/16. After consultation w Dr Janese Banks Burna Sis RN spoke w her ) to then start back taking Ibrance as she had previously taken. She will now come to next appt for labs and to see Dr Janese Banks Sept 20 @ 145 pm.

## 2016-12-11 ENCOUNTER — Telehealth: Payer: Self-pay | Admitting: *Deleted

## 2016-12-11 MED ORDER — AMLODIPINE BESYLATE 5 MG PO TABS: 5.0000 mg | ORAL_TABLET | Freq: Every day | ORAL | 0 refills | Status: DC

## 2016-12-11 NOTE — Telephone Encounter (Signed)
Pt called to say that she only has 4 pills left and needs refill of her b/p med. I spoke to Dr. Janese Banks and she said to fill it once and call her PCP to cont. Med in the future.  Pat. Contacted and told that we will refill it one time and I will send message to PCP Rutherford Guys.

## 2016-12-16 ENCOUNTER — Telehealth: Payer: Self-pay | Admitting: *Deleted

## 2016-12-16 DIAGNOSIS — C50912 Malignant neoplasm of unspecified site of left female breast: Secondary | ICD-10-CM

## 2016-12-16 DIAGNOSIS — C7951 Secondary malignant neoplasm of bone: Secondary | ICD-10-CM

## 2016-12-16 MED ORDER — PROCHLORPERAZINE MALEATE 10 MG PO TABS: 10.0000 mg | ORAL_TABLET | Freq: Four times a day (QID) | ORAL | 1 refills | Status: DC | PRN

## 2016-12-16 NOTE — Telephone Encounter (Signed)
She has nausea from time to time and the zofran does not help but the compazine does. Wants refill. Sent in refill for pt. Pt still getting last tooth repaired this Thursday. She will call me after all tooth work done so we can get approval letter to start zometa

## 2016-12-17 ENCOUNTER — Inpatient Hospital Stay: Payer: Medicare Other

## 2016-12-21 ENCOUNTER — Telehealth: Payer: Self-pay | Admitting: *Deleted

## 2016-12-21 NOTE — Telephone Encounter (Signed)
Pt called to say she is having teeth cleaning tom. And she wanted to know if she should go while on ibrance.  She just started [facility] Sat 9/8.  Dr. Janese Banks said she could get the cleaning.I called pt to let her know that

## 2016-12-31 ENCOUNTER — Inpatient Hospital Stay: Payer: Medicare Other

## 2016-12-31 ENCOUNTER — Inpatient Hospital Stay: Payer: Medicare Other | Attending: Oncology | Admitting: *Deleted

## 2016-12-31 ENCOUNTER — Encounter: Payer: Self-pay | Admitting: Oncology

## 2016-12-31 ENCOUNTER — Inpatient Hospital Stay (HOSPITAL_BASED_OUTPATIENT_CLINIC_OR_DEPARTMENT_OTHER): Payer: Medicare Other | Admitting: Oncology

## 2016-12-31 VITALS — BP 180/75 | HR 90 | Temp 98.6°F | Resp 12 | Ht 62.0 in | Wt 197.6 lb

## 2016-12-31 DIAGNOSIS — K5730 Diverticulosis of large intestine without perforation or abscess without bleeding: Secondary | ICD-10-CM | POA: Diagnosis not present

## 2016-12-31 DIAGNOSIS — C7951 Secondary malignant neoplasm of bone: Secondary | ICD-10-CM | POA: Diagnosis not present

## 2016-12-31 DIAGNOSIS — Z9221 Personal history of antineoplastic chemotherapy: Secondary | ICD-10-CM | POA: Diagnosis not present

## 2016-12-31 DIAGNOSIS — C50912 Malignant neoplasm of unspecified site of left female breast: Secondary | ICD-10-CM | POA: Diagnosis not present

## 2016-12-31 DIAGNOSIS — K760 Fatty (change of) liver, not elsewhere classified: Secondary | ICD-10-CM | POA: Diagnosis not present

## 2016-12-31 DIAGNOSIS — K6289 Other specified diseases of anus and rectum: Secondary | ICD-10-CM | POA: Insufficient documentation

## 2016-12-31 DIAGNOSIS — I10 Essential (primary) hypertension: Secondary | ICD-10-CM | POA: Insufficient documentation

## 2016-12-31 DIAGNOSIS — D709 Neutropenia, unspecified: Secondary | ICD-10-CM

## 2016-12-31 DIAGNOSIS — R978 Other abnormal tumor markers: Secondary | ICD-10-CM | POA: Diagnosis not present

## 2016-12-31 DIAGNOSIS — Z79899 Other long term (current) drug therapy: Secondary | ICD-10-CM | POA: Insufficient documentation

## 2016-12-31 DIAGNOSIS — Z170 Estrogen receptor positive status [ER+]: Secondary | ICD-10-CM

## 2016-12-31 LAB — COMPREHENSIVE METABOLIC PANEL
ALBUMIN: 3.9 g/dL (ref 3.5–5.0)
ALK PHOS: 164 U/L — AB (ref 38–126)
ALT: 21 U/L (ref 14–54)
ANION GAP: 9 (ref 5–15)
AST: 30 U/L (ref 15–41)
BUN: 8 mg/dL (ref 6–20)
CO2: 23 mmol/L (ref 22–32)
Calcium: 9.1 mg/dL (ref 8.9–10.3)
Chloride: 106 mmol/L (ref 101–111)
Creatinine, Ser: 0.68 mg/dL (ref 0.44–1.00)
GFR calc Af Amer: >60 mL/min (ref >60)
GFR calc non Af Amer: >60 mL/min (ref >60)
GLUCOSE: 146 mg/dL — AB (ref 65–99)
POTASSIUM: 3.5 mmol/L (ref 3.5–5.1)
SODIUM: 138 mmol/L (ref 135–145)
Total Bilirubin: 0.9 mg/dL (ref 0.3–1.2)
Total Protein: 6.6 g/dL (ref 6.5–8.1)

## 2016-12-31 LAB — CBC WITH DIFFERENTIAL/PLATELET
BASOS PCT: 1 %
Basophils Absolute: 0 10*3/uL (ref 0–0.1)
EOS PCT: 5 %
Eosinophils Absolute: 0.1 10*3/uL (ref 0–0.7)
HCT: 28.3 % — ABNORMAL LOW (ref 35.0–47.0)
HEMOGLOBIN: 10 g/dL — AB (ref 12.0–16.0)
LYMPHS ABS: 0.8 10*3/uL — AB (ref 1.0–3.6)
LYMPHS PCT: 45 %
MCH: 33.8 pg (ref 26.0–34.0)
MCHC: 35.1 g/dL (ref 32.0–36.0)
MCV: 96.2 fL (ref 80.0–100.0)
MONO ABS: 0.1 10*3/uL — AB (ref 0.2–0.9)
Monocytes Relative: 4 %
Neutro Abs: 0.7 10*3/uL — ABNORMAL LOW (ref 1.4–6.5)
Neutrophils Relative %: 45 %
Platelets: 131 10*3/uL — ABNORMAL LOW (ref 150–440)
RBC: 2.95 MIL/uL — ABNORMAL LOW (ref 3.80–5.20)
RDW: 18.9 % — AB (ref 11.5–14.5)
WBC: 1.7 10*3/uL — AB (ref 3.6–11.0)

## 2016-12-31 MED ORDER — LETROZOLE 2.5 MG PO TABS: 2.5000 mg | ORAL_TABLET | Freq: Every day | ORAL | 3 refills | Status: DC

## 2016-12-31 MED ORDER — SODIUM CHLORIDE 0.9 % IV SOLN
Freq: Once | INTRAVENOUS | Status: AC
  Administered 2016-12-31: 16:00:00 via INTRAVENOUS
  Filled 2016-12-31: qty 1000

## 2016-12-31 MED ORDER — ZOLEDRONIC ACID 4 MG/100ML IV SOLN
4.0000 mg | Freq: Once | INTRAVENOUS | Status: AC
  Administered 2016-12-31: 4 mg via INTRAVENOUS
  Filled 2016-12-31: qty 100

## 2016-12-31 MED ORDER — HEPARIN SOD (PORK) LOCK FLUSH 100 UNIT/ML IV SOLN
500.0000 [IU] | Freq: Once | INTRAVENOUS | Status: AC | PRN
  Administered 2016-12-31: 500 [IU]
  Filled 2016-12-31: qty 5

## 2016-12-31 NOTE — Patient Instructions (Signed)
Begin taking Calcium 1200 mg and vitamin d 800 units per day. Start Letrozole

## 2016-12-31 NOTE — Progress Notes (Signed)
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 Patient is a 76 year old female with no significant comorbidities who noticed left breast mass about in 2017. She was referred to Dr. Dahlia Byes on 08/25/16 by [place]. Patient initially noticed a mass during self breast exam and observed that she had an area of foul-smelling drainage.   Biopsy of mass in the office showed:  DIAGNOSIS:  A. LEFT BREAST SKIN; EXCISION:  - INVASIVE CARCINOMA MORPHOLOGICALLY CONSISTENT WITH MAMMARY ORIGIN  INVOLVING THE DERMIS.   BREAST BIOMARKER TESTS  Estrogen Receptor (ER) Status: POSITIVE, >90%  Progesterone Receptor (PgR) Status: POSITIVE, >70%  Her2 negative  2. Patient underwent bilateral diagnostic mammogram on 08/31/2016 showed:  IMPRESSION: 1. Known left breast cancer, retroareolar with probable extension to the nipple, measuring 4.3 cm greatest dimension by ultrasound, with associated architectural distortion and associated left nipple retraction, and with associated diffuse skin thickening on the left. 2. No enlarged or morphologically abnormal lymph nodes are seen in the left axilla by ultrasound. 3. No evidence of malignancy within the right breast.  3. Patient was also complaining of some perirectal pain and underwent CT abdomen pelvis with contrast on 09/02/2016 which showed:  IMPRESSION: Left colonic diverticulosis. No active diverticulitis.Fatty liver. No acute findings or evidence of metastatic disease in the abdomen or pelvis. Diffuse bony sclerotic  metastases.  4. Given inflammatory breast cancer- plan was to give 4 cycles of dose dense AC followed by possible mastectomy and treat bone mets with AI + ibrance  5. PET CT scan on 09/11/16 showed:  IMPRESSION: 1. Low-grade but abnormal hypermetabolic activity associated with the cutaneous and sub areolar glandular tissues of the left breast, compatible with malignancy, representative SUV 3.9 compared to contralateral normal side 1.4. 2. Diffuse sclerotic osseous metastatic disease with low-grade hypermetabolic activity, a representative SUV along the left sacrum 5.4. 3. Other imaging findings of potential clinical significance: Aortic Atherosclerosis (ICD10-I70.0). Descending and sigmoid colon Diverticulosis.  6. Baseline MUGA scan showed EF of 71%. Baseline CA 27.29 elevated at 563.2  7. 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 Dahlia Byes.   8. She completed 4 cycles of dose dense AC with neulasta support on 11/19/16.  8. Baseline bone density scan on 12/01/16 showed T-Score of -0.4. Patient initiated Ibrance on 12/19/16.    Interval history- She continues to be fatigued. Started Ibrance on 12/19/16. Denies nausea, mouth sores, diarrhea or abdominal pain. Fatigue is same as prior to initiating Ibrance.  She has undergone all dental work required and has received dental clearance to begin Zometa. She has not started letrozole.   ECOG PS- 0 Pain scale- 0  Review of systems- Review of Systems  Constitutional: Positive for malaise/fatigue. Negative for chills, fever and weight loss.  HENT: Negative for congestion, ear discharge and nosebleeds.   Eyes: Negative for blurred vision.  Respiratory: Negative for cough, hemoptysis, sputum production, shortness of breath and wheezing.   Cardiovascular: Negative for chest pain, palpitations, orthopnea and claudication.  Gastrointestinal: Negative for abdominal pain, blood in stool, constipation, diarrhea,  heartburn, melena, nausea and vomiting.  Genitourinary: Negative  for dysuria, flank pain, frequency, hematuria and urgency.  Musculoskeletal: Negative for back pain, joint pain and myalgias.  Skin: Negative for rash.  Neurological: Negative for dizziness, tingling, focal weakness, seizures, weakness and headaches.  Endo/Heme/Allergies: Does not bruise/bleed easily.  Psychiatric/Behavioral: Negative for depression and suicidal ideas. The patient is nervous/anxious. The patient does not have insomnia.     No Known Allergies  Past Medical History:  Diagnosis Date  . Cancer ([place])    left breast  . Hypertension   . Port catheter in place 09/14/2016   Placed 09/10/2016 RT chest.   Past Surgical History:  Procedure Laterality Date  . ABDOMINAL HYSTERECTOMY    . APPENDECTOMY    . PORTACATH PLACEMENT Right 09/10/2016   Procedure: INSERTION PORT-A-CATH;  Surgeon: Jules Husbands, MD;  Location: [facility] ORS;  Service: General;  Laterality: Right;   Social History   Social History  . Marital status: Married    Spouse name: N/A  . Number of children: N/A  . Years of education: N/A   Occupational History  . Not on file.   Social History Main Topics  . Smoking status: Never Smoker  . Smokeless tobacco: Never Used  . Alcohol use No  . Drug use: No  . Sexual activity: No   Other Topics Concern  . Not on file   Social History Narrative  . No narrative on file   Family History  Problem Relation Age of Onset  . Parkinson's disease Mother   . Cancer Father     Current Outpatient Prescriptions:  .  acetaminophen (TYLENOL) 500 MG tablet, Take 1,000 mg by mouth every 6 (six) hours as needed for moderate pain., Disp: , Rfl:  .  amLODipine (NORVASC) 5 MG tablet, Take 1 tablet (5 mg total) by mouth daily., Disp: 30 tablet, Rfl: 0 .  dexamethasone (DECADRON) 4 MG tablet, Take 2 tablets by mouth once a day on the day after chemotherapy and then take 2 tablets two times a day for 2 days. Take  with food. (Patient taking differently: Take 2 tablets by mouth once a day on the day after chemotherapy and then take 2 tablets two times a day for 2 days. Take with food.), Disp: 30 tablet, Rfl: 1 .  diphenoxylate-atropine (LOMOTIL) 2.5-0.025 MG tablet, Take 1 tablet by mouth 4 (four) times daily as needed for diarrhea or loose stools., Disp: 30 tablet, Rfl: 0 .  hydrochlorothiazide (MICROZIDE) 12.5 MG capsule, 12.5 mg daily as needed. , Disp: , Rfl:  .  lidocaine-prilocaine (EMLA) cream, Apply to affected area once, Disp: 30 g, Rfl: 3 .  LORazepam (ATIVAN) 0.5 MG tablet, Take 1 tablet (0.5 mg total) by mouth every 6 (six) hours as needed (Nausea or vomiting). (Patient taking differently: Take 0.5 mg by mouth every 6 (six) hours as needed. Nausea or vomiting), Disp: 30 tablet, Rfl: 0 .  ondansetron (ZOFRAN) 8 MG tablet, Take 1 tablet (8 mg total) by mouth 2 (two) times daily as needed. Start on the third day after chemotherapy. (Patient taking differently: Take 8 mg by mouth 2 (two) times daily as needed for nausea. Start on the third day after chemotherapy.), Disp: 30 tablet, Rfl: 1 .  palbociclib (IBRANCE) 125 MG capsule, Take 1 capsule (125 mg total) by mouth daily with breakfast. Take whole with food. 3 weeks on 1 week off, Disp: 21 capsule, Rfl: 5 .  PREVIDENT 5000 BOOSTER PLUS 1.1 % PSTE, , Disp: , Rfl:  .  prochlorperazine (COMPAZINE) 10  MG tablet, Take 1 tablet (10 mg total) by mouth every 6 (six) hours as needed (Nausea or vomiting)., Disp: 30 tablet, Rfl: 1 .  letrozole (FEMARA) 2.5 MG tablet, Take 1 tablet (2.5 mg total) by mouth daily., Disp: 90 tablet, Rfl: 3  Physical exam:  Vitals:   12/31/16 1408  BP: (!) 180/75  Pulse: 90  Resp: 12  Temp: 98.6 F (37 C)  TempSrc: Tympanic  Weight: 197 lb 9.6 oz (89.6 kg)  Height: '5\' 2"'$  (1.575 m)   Physical Exam  Constitutional: She is oriented to person, place, and time and well-developed, well-nourished, and in no distress.  HENT:  Head:  Normocephalic and atraumatic.  Eyes: Pupils are equal, round, and reactive to light. EOM are normal.  Neck: Normal range of motion.  Cardiovascular: Normal rate, regular rhythm and normal heart sounds.   Pulmonary/Chest: Effort normal and breath sounds normal.  Abdominal: Soft. Bowel sounds are normal.  Neurological: She is alert and oriented to person, place, and time.  Skin: Skin is warm and dry.    CMP Latest Ref Rng & Units 12/31/2016  Glucose 65 - 99 mg/dL 146(H)  BUN 6 - 20 mg/dL 8  Creatinine 0.44 - 1.00 mg/dL 0.68  Sodium 135 - 145 mmol/L 138  Potassium 3.5 - 5.1 mmol/L 3.5  Chloride 101 - 111 mmol/L 106  CO2 22 - 32 mmol/L 23  Calcium 8.9 - 10.3 mg/dL 9.1  Total Protein 6.5 - 8.1 g/dL 6.6  Total Bilirubin 0.3 - 1.2 mg/dL 0.9  Alkaline Phos 38 - 126 U/L 164(H)  AST 15 - 41 U/L 30  ALT 14 - 54 U/L 21   CBC Latest Ref Rng & Units 12/31/2016  WBC 3.6 - 11.0 K/uL 1.7(L)  Hemoglobin 12.0 - 16.0 g/dL 10.0(L)  Hematocrit 35.0 - 47.0 % 28.3(L)  Platelets 150 - 440 K/uL 131(L)    Assessment and plan- Patient is a 76 y.o. female with Stage IV left breast cancer with bone only metastases s/p 4 cycles of dose dense AC. Clinically her breast mass is smaller although not significant decrease in size. CA 27/29 went up and she was switched to Ibrance 125 mg daily with 3 weeks of medication and 1 week off.   Patient initiated [facility] approximately 2 weeks ago. WBC count today is 1.7 with ANC of 0.7. She is afebrile today and reports no recent illness or fever. Given her grade 3 neutropenia we'll plan to proceed with Ibrance to completion of cycle 1. Will repeat CBC with differential on day 22. If neutropenia worsens, will plan to hold Ibrance until resolution or improvement in neutrophil count. After resolution, will consider resuming Ibrance at lower dose. Discussed the risk of infection with the patient including neutropenic precautions. Discussed the importance of proper hand hygiene  avoiding those who are ill and the need to monitor her temperature Patient is to notify M.D. Fever greater than 101. CA 27. 29 had decreased from 739.8 in July to 582.1 in August. Ca 27.29 pending at this time. Will continue to monitor.  Again we discussed the benefits of aromatase inhibitor therapy, specifically letrozole. Again discussed side effects of letrozole including hot flashes, headache, bone pain, night sweats, fatigue, weakness, fatigue. Patient agrees to initiate letrozole. Patient agrees to initiate letrozole at this time. She will require 5 years of AI therapy completing in 2023. Baseline bone density scan with T score of -0.4. Will initiate calcium '1200mg'$  and vitamin D 800 iu daily therapy. We will plan  to repeat bone density scan in August 2020.  Previously imaging has demonstrated osseous metastasis to lower ribs and sacrum. Had previously discussed the role of Zometa in the setting of bony metastasis. Patient has come completed her dental work and has received dental clearance We'll plan for her to receive Zometa today and once a month thereafter.  Patient's blood pressure continues to remain elevated today. Discussed the need to have this followed by primary care. She endorses being quite anxious about today's visit. Will continue to monitor but appreciate PCP's management of this issue.   We will plan for patient to receive Zometa today and complete cycle 1 of Ibrance on 9/29. Will plan for her to see Dr. Janese Banks on Monday, October 1st for lab work and re-evaluation.     Visit Diagnosis 1. Malignant neoplasm of left breast in female, estrogen receptor positive, unspecified site of breast ([place])      Dr. Randa Evens, MD, MPH [facility] at [facility] Pager- [phone] 12/31/2016 4:21 PM

## 2016-12-31 NOTE — Progress Notes (Signed)
Patient here for follow up. No changes since her last appointment,.

## 2017-01-01 LAB — CANCER ANTIGEN 27.29: CA 27.29: 294.3 U/mL — ABNORMAL HIGH (ref 0.0–38.6)

## 2017-01-11 ENCOUNTER — Inpatient Hospital Stay: Payer: Medicare Other | Attending: Oncology | Admitting: Oncology

## 2017-01-11 ENCOUNTER — Inpatient Hospital Stay: Payer: Medicare Other

## 2017-01-11 ENCOUNTER — Encounter: Payer: Self-pay | Admitting: Oncology

## 2017-01-11 VITALS — BP 159/82 | HR 83 | Temp 98.9°F | Resp 14 | Wt 197.0 lb

## 2017-01-11 DIAGNOSIS — Z79811 Long term (current) use of aromatase inhibitors: Secondary | ICD-10-CM | POA: Insufficient documentation

## 2017-01-11 DIAGNOSIS — Z170 Estrogen receptor positive status [ER+]: Secondary | ICD-10-CM | POA: Diagnosis not present

## 2017-01-11 DIAGNOSIS — Z90710 Acquired absence of both cervix and uterus: Secondary | ICD-10-CM | POA: Diagnosis not present

## 2017-01-11 DIAGNOSIS — Z79899 Other long term (current) drug therapy: Secondary | ICD-10-CM | POA: Diagnosis not present

## 2017-01-11 DIAGNOSIS — D6959 Other secondary thrombocytopenia: Secondary | ICD-10-CM | POA: Insufficient documentation

## 2017-01-11 DIAGNOSIS — I10 Essential (primary) hypertension: Secondary | ICD-10-CM | POA: Diagnosis not present

## 2017-01-11 DIAGNOSIS — C7951 Secondary malignant neoplasm of bone: Secondary | ICD-10-CM | POA: Insufficient documentation

## 2017-01-11 DIAGNOSIS — C50912 Malignant neoplasm of unspecified site of left female breast: Secondary | ICD-10-CM | POA: Diagnosis not present

## 2017-01-11 DIAGNOSIS — J029 Acute pharyngitis, unspecified: Secondary | ICD-10-CM | POA: Insufficient documentation

## 2017-01-11 DIAGNOSIS — T50905A Adverse effect of unspecified drugs, medicaments and biological substances, initial encounter: Secondary | ICD-10-CM

## 2017-01-11 DIAGNOSIS — K760 Fatty (change of) liver, not elsewhere classified: Secondary | ICD-10-CM | POA: Diagnosis not present

## 2017-01-11 DIAGNOSIS — D72810 Lymphocytopenia: Secondary | ICD-10-CM | POA: Insufficient documentation

## 2017-01-11 DIAGNOSIS — D702 Other drug-induced agranulocytosis: Secondary | ICD-10-CM

## 2017-01-11 LAB — CBC WITH DIFFERENTIAL/PLATELET
BASOS PCT: 2 %
Basophils Absolute: 0 10*3/uL (ref 0–0.1)
EOS ABS: 0 10*3/uL (ref 0–0.7)
EOS PCT: 3 %
HCT: 29.3 % — ABNORMAL LOW (ref 35.0–47.0)
Hemoglobin: 10.2 g/dL — ABNORMAL LOW (ref 12.0–16.0)
Lymphocytes Relative: 47 %
Lymphs Abs: 0.6 10*3/uL — ABNORMAL LOW (ref 1.0–3.6)
MCH: 34.5 pg — ABNORMAL HIGH (ref 26.0–34.0)
MCHC: 34.8 g/dL (ref 32.0–36.0)
MCV: 99 fL (ref 80.0–100.0)
MONO ABS: 0 10*3/uL — AB (ref 0.2–0.9)
MONOS PCT: 3 %
Neutro Abs: 0.6 10*3/uL — ABNORMAL LOW (ref 1.4–6.5)
Neutrophils Relative %: 45 %
PLATELETS: 47 10*3/uL — AB (ref 150–440)
RBC: 2.96 MIL/uL — ABNORMAL LOW (ref 3.80–5.20)
RDW: 18.7 % — AB (ref 11.5–14.5)
WBC: 1.3 10*3/uL — CL (ref 3.6–11.0)

## 2017-01-11 LAB — COMPREHENSIVE METABOLIC PANEL
ALBUMIN: 4 g/dL (ref 3.5–5.0)
ALT: 18 U/L (ref 14–54)
ANION GAP: 9 (ref 5–15)
AST: 32 U/L (ref 15–41)
Alkaline Phosphatase: 170 U/L — ABNORMAL HIGH (ref 38–126)
BILIRUBIN TOTAL: 0.8 mg/dL (ref 0.3–1.2)
BUN: 7 mg/dL (ref 6–20)
CALCIUM: 8.8 mg/dL — AB (ref 8.9–10.3)
CO2: 25 mmol/L (ref 22–32)
CREATININE: 0.56 mg/dL (ref 0.44–1.00)
Chloride: 106 mmol/L (ref 101–111)
GFR calc Af Amer: >60 mL/min (ref >60)
GFR calc non Af Amer: >60 mL/min (ref >60)
GLUCOSE: 122 mg/dL — AB (ref 65–99)
POTASSIUM: 3.5 mmol/L (ref 3.5–5.1)
Sodium: 140 mmol/L (ref 135–145)
TOTAL PROTEIN: 6.8 g/dL (ref 6.5–8.1)

## 2017-01-11 NOTE — Progress Notes (Signed)
Patient here for routine follow up. She reports having constipation for the last two weeks, which she attributes to the letrozole. She has increased her fiber intake to help with this. She also reports some seasonal allergy symptoms and has been having itchy eyes. Her left eye is red from scratching in her sleep. She denies having any pain. She states that she can't have a flu shot on Ibrance.

## 2017-01-11 NOTE — Progress Notes (Signed)
Hematology/Oncology Consult note [facility]  Telephone:([phone] [phone]  Patient Care Team: Letta Median, MD as PCP - General (Family Medicine)   Name of the patient: Victoria Steele  [id number]  [birth year]   Date of visit: 01/11/17  Diagnosis- Stage IV invasive mammary carcinoma [id number] with bone only metastases  Chief complaint/ Reason for visit- f/u of breast cancer on letrozole plus ibrance  Heme/Onc history:1. Patient is a 76 year old female with no significant comorbidities who noticed left breast mass about 6-9 months ago. She thought it would go awaybut it continued to increase in size and began to involve her skin. She also noted tenderness to palpation as well as intermittent sharp tingling pain. She was seen by Dr. Adora Fridge on 08/25/2016 and patient had a biopsy of her skin in his office which showed:DIAGNOSIS:  A. LEFT BREAST SKIN; EXCISION:  - INVASIVE CARCINOMA MORPHOLOGICALLY CONSISTENT WITH MAMMARY ORIGIN  INVOLVING THE DERMIS.   BREAST BIOMARKER TESTS  Estrogen Receptor (ER) Status: POSITIVE, >90%  Progesterone Receptor (PgR) Status: POSITIVE, >70%  Her2 negative  2. Patient underwent bilateral diagnostic mammogram on 08/31/2016 showed:IMPRESSION: 1. Known left breast cancer, retroareolar with probable extension to the nipple, measuring 4.3 cm greatest dimension by ultrasound, with associated architectural distortion and associated left nipple retraction, and with associated diffuse skin thickening on the left. 2. No enlarged or morphologically abnormal lymph nodes are seen in the left axilla by ultrasound. 3. No evidence of malignancy within the right breast.  3. Patient was also complaining of some perirectal pain and underwent CT abdomen pelvis with contrast on 09/02/2016 which showed: IMPRESSION: Left colonic diverticulosis. No active diverticulitis.Fatty liver. No acute findings or evidence of metastatic  disease in the abdomen or pelvis. Diffuse bony sclerotic metastases.  4. Other than that her breast pain patient overall feels well. Denies any unintentional weight loss. She continues to be active and care for her great-grandchildren. She has had a hysterectomy in the past. No family history of breast or ovarian cancer.  5. Given that she had inflammatory breast cancer- plan was to give 4 cycles of dose dense AC followed by possible mastectomy and treat bone mets with AI + ibrance  6. PET CT scan on 09/11/16 showed: IMPRESSION: 1. Low-grade but abnormal hypermetabolic activity associated with the cutaneous and sub areolar glandular tissues of the left breast, compatible with malignancy, representative SUV 3.9 compared to contralateral normal side 1.4. 2. Diffuse sclerotic osseous metastatic disease with low-grade hypermetabolic activity, a representative SUV along the left sacrum 5.4. 3. Other imaging findings of potential clinical significance: Aortic Atherosclerosis (ICD10-I70.0). Descending and sigmoid colon Diverticulosis.  7. Baseline MUGA scan showed EF of 71%. Baseline CA 27.29 elevated at 563.2  8. 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  9. Letrozole and ibrance started in sept 2018. zometa monthly also started. Baseline bone density scan normal  Interval history- she has mild nasal congestion. No fever. Left eye is irritated since this morning. No discharge  ECOG PS- 0 Pain scale- 0   Review of systems- Review of Systems  Constitutional: Negative for chills, fever, malaise/fatigue and weight loss.  HENT: Positive for congestion. Negative for ear discharge and nosebleeds.   Eyes: Positive for redness. Negative for blurred vision.  Respiratory: Negative for cough, hemoptysis, sputum production, shortness of breath and wheezing.   Cardiovascular: Negative for chest pain, palpitations, orthopnea and claudication.  Gastrointestinal: Negative for  abdominal pain, blood in stool, constipation, diarrhea, heartburn,  melena, nausea and vomiting.  Genitourinary: Negative for dysuria, flank pain, frequency, hematuria and urgency.  Musculoskeletal: Negative for back pain, joint pain and myalgias.  Skin: Negative for rash.  Neurological: Negative for dizziness, tingling, focal weakness, seizures, weakness and headaches.  Endo/Heme/Allergies: Does not bruise/bleed easily.  Psychiatric/Behavioral: Negative for depression and suicidal ideas. The patient does not have insomnia.        No Known Allergies   Past Medical History:  Diagnosis Date  . Cancer (HCC)    left breast  . Hypertension   . Port catheter in place 09/14/2016   Placed 09/10/2016 RT chest.     Past Surgical History:  Procedure Laterality Date  . ABDOMINAL HYSTERECTOMY    . APPENDECTOMY    . PORTACATH PLACEMENT Right 09/10/2016   Procedure: INSERTION PORT-A-CATH;  Surgeon: Leafy Ro, MD;  Location: [facility] ORS;  Service: General;  Laterality: Right;    Social History   Social History  . Marital status: Married    Spouse name: N/A  . Number of children: N/A  . Years of education: N/A   Occupational History  . Not on file.   Social History Main Topics  . Smoking status: Never Smoker  . Smokeless tobacco: Never Used  . Alcohol use No  . Drug use: No  . Sexual activity: No   Other Topics Concern  . Not on file   Social History Narrative  . No narrative on file    Family History  Problem Relation Age of Onset  . Parkinson's disease Mother   . Cancer Father      Current Outpatient Prescriptions:  .  acetaminophen (TYLENOL) 500 MG tablet, Take 1,000 mg by mouth every 6 (six) hours as needed for moderate pain., Disp: , Rfl:  .  amLODipine (NORVASC) 5 MG tablet, Take 1 tablet (5 mg total) by mouth daily., Disp: 30 tablet, Rfl: 0 .  dexamethasone (DECADRON) 4 MG tablet, Take 2 tablets by mouth once a day on the day after chemotherapy and then take 2  tablets two times a day for 2 days. Take with food. (Patient taking differently: Take 2 tablets by mouth once a day on the day after chemotherapy and then take 2 tablets two times a day for 2 days. Take with food.), Disp: 30 tablet, Rfl: 1 .  diphenoxylate-atropine (LOMOTIL) 2.5-0.025 MG tablet, Take 1 tablet by mouth 4 (four) times daily as needed for diarrhea or loose stools., Disp: 30 tablet, Rfl: 0 .  hydrochlorothiazide (MICROZIDE) 12.5 MG capsule, 12.5 mg daily as needed. , Disp: , Rfl:  .  letrozole (FEMARA) 2.5 MG tablet, Take 1 tablet (2.5 mg total) by mouth daily., Disp: 90 tablet, Rfl: 3 .  lidocaine-prilocaine (EMLA) cream, Apply to affected area once, Disp: 30 g, Rfl: 3 .  LORazepam (ATIVAN) 0.5 MG tablet, Take 1 tablet (0.5 mg total) by mouth every 6 (six) hours as needed (Nausea or vomiting). (Patient taking differently: Take 0.5 mg by mouth every 6 (six) hours as needed. Nausea or vomiting), Disp: 30 tablet, Rfl: 0 .  ondansetron (ZOFRAN) 8 MG tablet, Take 1 tablet (8 mg total) by mouth 2 (two) times daily as needed. Start on the third day after chemotherapy. (Patient taking differently: Take 8 mg by mouth 2 (two) times daily as needed for nausea. Start on the third day after chemotherapy.), Disp: 30 tablet, Rfl: 1 .  palbociclib (IBRANCE) 125 MG capsule, Take 1 capsule (125 mg total) by mouth daily with breakfast. Take  whole with food. 3 weeks on 1 week off, Disp: 21 capsule, Rfl: 5 .  PREVIDENT 5000 BOOSTER PLUS 1.1 % PSTE, , Disp: , Rfl:  .  prochlorperazine (COMPAZINE) 10 MG tablet, Take 1 tablet (10 mg total) by mouth every 6 (six) hours as needed (Nausea or vomiting)., Disp: 30 tablet, Rfl: 1  Physical exam:  Vitals:   01/11/17 0956  BP: (!) 159/82  Pulse: 83  Resp: 14  Temp: 98.9 F (37.2 C)  TempSrc: Tympanic  Weight: 197 lb (89.4 kg)   Physical Exam  Constitutional: She is oriented to person, place, and time and well-developed, well-nourished, and in no distress.    HENT:  Head: Normocephalic and atraumatic.  Mouth/Throat: Oropharynx is clear and moist.  Conjunctival injection+  Eyes: Pupils are equal, round, and reactive to light. EOM are normal.  Neck: Normal range of motion.  Cardiovascular: Normal rate, regular rhythm and normal heart sounds.   Pulmonary/Chest: Effort normal and breath sounds normal.  Abdominal: Soft. Bowel sounds are normal.  Neurological: She is alert and oriented to person, place, and time.  Skin: Skin is warm and dry.   left breast mass appears smaller  CMP Latest Ref Rng & Units 12/31/2016  Glucose 65 - 99 mg/dL 146(H)  BUN 6 - 20 mg/dL 8  Creatinine 0.44 - 1.00 mg/dL 0.68  Sodium 135 - 145 mmol/L 138  Potassium 3.5 - 5.1 mmol/L 3.5  Chloride 101 - 111 mmol/L 106  CO2 22 - 32 mmol/L 23  Calcium 8.9 - 10.3 mg/dL 9.1  Total Protein 6.5 - 8.1 g/dL 6.6  Total Bilirubin 0.3 - 1.2 mg/dL 0.9  Alkaline Phos 38 - 126 U/L 164(H)  AST 15 - 41 U/L 30  ALT 14 - 54 U/L 21   CBC Latest Ref Rng & Units 01/11/2017  WBC 3.6 - 11.0 K/uL 1.3(LL)  Hemoglobin 12.0 - 16.0 g/dL 10.2(L)  Hematocrit 35.0 - 47.0 % 29.3(L)  Platelets 150 - 440 K/uL 47(L)     Assessment and plan- Patient is a 76 y.o. female with Stage IV left breast cancer with bone only mets  ANC today is 0.6 and wbc 1.3. She also has moderate thrombocytopenia with platelet count of 47. This is due to ibrance. This is her week off ibrance.I will repeat cbc with diff in 1 week. She will not restart ibrance until we ask her to. When her counts recover, I will retsart ibrance at lower dose of 100 mg. She will continue po letrozole.   She will call us if she has fever, infectious symptoms including all but not limited to productive cough, sputum production, SOB, symptoms of UTI. Neutropenic precautions reviewed. Patient verbalizes understanding  I will see her back on 02/01/17 with cbc with diff and cmp and CA 27.29 prior to cycle 2 of ibrance. She will also get her zometa on  that day.    Visit Diagnosis 1. Malignant neoplasm of left breast in female, estrogen receptor positive, unspecified site of breast ([place])   2. Bone metastases ([place])   3. Drug-induced neutropenia (HCC)   4. Drug-induced thrombocytopenia      Dr. Randa Evens, MD, MPH [facility] at [facility] Pager- [phone] 01/11/2017 10:20 AM

## 2017-01-12 ENCOUNTER — Other Ambulatory Visit: Payer: Self-pay | Admitting: *Deleted

## 2017-01-12 ENCOUNTER — Telehealth: Payer: Self-pay | Admitting: *Deleted

## 2017-01-12 DIAGNOSIS — C50912 Malignant neoplasm of unspecified site of left female breast: Secondary | ICD-10-CM

## 2017-01-12 DIAGNOSIS — C7951 Secondary malignant neoplasm of bone: Secondary | ICD-10-CM

## 2017-01-12 MED ORDER — PROCHLORPERAZINE MALEATE 10 MG PO TABS: 10.0000 mg | ORAL_TABLET | Freq: Four times a day (QID) | ORAL | 1 refills | Status: DC | PRN

## 2017-01-12 NOTE — Telephone Encounter (Signed)
Pt called and needed refills of her meds and I told her that we can refill the compazine but I had spoke to someone at [employer]' office and was told to fax request over and I did that last month asking them to take over the amlodipine.  I called Dr. Rebeca Alert and spoke to Vivien Rota  And she is sending a message to the doctor but wants me to send the fax again and I did.  The pt. Has 3 tablets left and I will call md office again if I do not hear back from them. Patient is acceptable to above

## 2017-01-14 ENCOUNTER — Telehealth: Payer: Self-pay | Admitting: *Deleted

## 2017-01-14 NOTE — Telephone Encounter (Signed)
Called pt to let her know that Dr. Hilaria Ota office called in the amlodipine into her pharmacy. I just got off the phone with the office at [facility] and spoke Almyra Free and it was sent to [address].

## 2017-01-18 ENCOUNTER — Inpatient Hospital Stay: Payer: Medicare Other

## 2017-01-18 DIAGNOSIS — C50912 Malignant neoplasm of unspecified site of left female breast: Secondary | ICD-10-CM

## 2017-01-18 DIAGNOSIS — Z170 Estrogen receptor positive status [ER+]: Principal | ICD-10-CM

## 2017-01-18 LAB — CBC WITH DIFFERENTIAL/PLATELET
BASOS PCT: 4 %
Basophils Absolute: 0.1 10*3/uL (ref 0–0.1)
EOS ABS: 0 10*3/uL (ref 0–0.7)
Eosinophils Relative: 2 %
HCT: 29.5 % — ABNORMAL LOW (ref 35.0–47.0)
HEMOGLOBIN: 10.1 g/dL — AB (ref 12.0–16.0)
LYMPHS ABS: 0.6 10*3/uL — AB (ref 1.0–3.6)
Lymphocytes Relative: 39 %
MCH: 34.7 pg — AB (ref 26.0–34.0)
MCHC: 34.3 g/dL (ref 32.0–36.0)
MCV: 101.1 fL — ABNORMAL HIGH (ref 80.0–100.0)
MONO ABS: 0.2 10*3/uL (ref 0.2–0.9)
MONOS PCT: 12 %
Neutro Abs: 0.6 10*3/uL — ABNORMAL LOW (ref 1.4–6.5)
Neutrophils Relative %: 43 %
Platelets: 108 10*3/uL — ABNORMAL LOW (ref 150–440)
RBC: 2.92 MIL/uL — ABNORMAL LOW (ref 3.80–5.20)
RDW: 20 % — AB (ref 11.5–14.5)
WBC: 1.5 10*3/uL — ABNORMAL LOW (ref 3.6–11.0)

## 2017-01-26 ENCOUNTER — Other Ambulatory Visit: Payer: Self-pay | Admitting: *Deleted

## 2017-01-26 DIAGNOSIS — C7951 Secondary malignant neoplasm of bone: Secondary | ICD-10-CM

## 2017-01-27 ENCOUNTER — Telehealth: Payer: Self-pay | Admitting: *Deleted

## 2017-01-27 MED ORDER — MAGIC MOUTHWASH: 5.0000 mL | Freq: Four times a day (QID) | ORAL | 0 refills | Status: DC | PRN

## 2017-01-27 NOTE — Telephone Encounter (Signed)
Pt had called to see what location she will come to for labs-it will be [place].  She then said that she has had sore throat for 1 week.  She has been drinking hot beverages and sometimes with lemon and honey in it and it helps but does not last long and at night she has a cough that keeps her awake until she puts in a cough drop.  She feels like it is hard to swallow and hurts to talk. She has no fevers, and she does not have sores in her mouth and no white patches on her tongue.  Dr. Janese Banks says she can try MMW.  I gave her the instructions and I will send rx into pharmacy.

## 2017-01-28 ENCOUNTER — Inpatient Hospital Stay: Payer: Medicare Other

## 2017-01-28 DIAGNOSIS — C50912 Malignant neoplasm of unspecified site of left female breast: Secondary | ICD-10-CM | POA: Diagnosis not present

## 2017-01-28 DIAGNOSIS — Z170 Estrogen receptor positive status [ER+]: Principal | ICD-10-CM

## 2017-01-28 LAB — CBC WITH DIFFERENTIAL/PLATELET
Basophils Absolute: 0.1 10*3/uL (ref 0–0.1)
Basophils Relative: 2 %
Eosinophils Absolute: 0.1 10*3/uL (ref 0–0.7)
Eosinophils Relative: 3 %
HEMATOCRIT: 34.8 % — AB (ref 35.0–47.0)
HEMOGLOBIN: 11.9 g/dL — AB (ref 12.0–16.0)
LYMPHS ABS: 0.9 10*3/uL — AB (ref 1.0–3.6)
Lymphocytes Relative: 34 %
MCH: 34.1 pg — AB (ref 26.0–34.0)
MCHC: 34.2 g/dL (ref 32.0–36.0)
MCV: 99.6 fL (ref 80.0–100.0)
MONOS PCT: 10 %
Monocytes Absolute: 0.3 10*3/uL (ref 0.2–0.9)
NEUTROS ABS: 1.4 10*3/uL (ref 1.4–6.5)
NEUTROS PCT: 51 %
Platelets: 334 10*3/uL (ref 150–440)
RBC: 3.5 MIL/uL — AB (ref 3.80–5.20)
RDW: 18.7 % — ABNORMAL HIGH (ref 11.5–14.5)
WBC: 2.7 10*3/uL — ABNORMAL LOW (ref 3.6–11.0)

## 2017-01-28 LAB — COMPREHENSIVE METABOLIC PANEL
ALK PHOS: 179 U/L — AB (ref 38–126)
ALT: 35 U/L (ref 14–54)
ANION GAP: 9 (ref 5–15)
AST: 41 U/L (ref 15–41)
Albumin: 4 g/dL (ref 3.5–5.0)
BILIRUBIN TOTAL: 0.7 mg/dL (ref 0.3–1.2)
BUN: 8 mg/dL (ref 6–20)
CO2: 24 mmol/L (ref 22–32)
CREATININE: 0.55 mg/dL (ref 0.44–1.00)
Calcium: 9.2 mg/dL (ref 8.9–10.3)
Chloride: 106 mmol/L (ref 101–111)
GFR calc non Af Amer: >60 mL/min (ref >60)
GLUCOSE: 139 mg/dL — AB (ref 65–99)
Potassium: 3.1 mmol/L — ABNORMAL LOW (ref 3.5–5.1)
Sodium: 139 mmol/L (ref 135–145)
TOTAL PROTEIN: 6.8 g/dL (ref 6.5–8.1)

## 2017-01-29 LAB — CANCER ANTIGEN 27.29: CAN 27.29: 268.4 U/mL — AB (ref 0.0–38.6)

## 2017-02-01 ENCOUNTER — Encounter: Payer: Self-pay | Admitting: Oncology

## 2017-02-01 ENCOUNTER — Telehealth: Payer: Self-pay | Admitting: Pharmacist

## 2017-02-01 ENCOUNTER — Other Ambulatory Visit: Payer: Self-pay | Admitting: *Deleted

## 2017-02-01 ENCOUNTER — Inpatient Hospital Stay: Payer: Medicare Other

## 2017-02-01 ENCOUNTER — Inpatient Hospital Stay (HOSPITAL_BASED_OUTPATIENT_CLINIC_OR_DEPARTMENT_OTHER): Payer: Medicare Other | Admitting: Oncology

## 2017-02-01 ENCOUNTER — Telehealth: Payer: Self-pay | Admitting: Oncology

## 2017-02-01 VITALS — BP 174/86 | HR 66 | Temp 99.3°F | Resp 16 | Wt 196.9 lb

## 2017-02-01 VITALS — BP 180/84 | HR 78 | Temp 97.8°F | Resp 18

## 2017-02-01 DIAGNOSIS — Z170 Estrogen receptor positive status [ER+]: Principal | ICD-10-CM

## 2017-02-01 DIAGNOSIS — D72810 Lymphocytopenia: Secondary | ICD-10-CM | POA: Diagnosis not present

## 2017-02-01 DIAGNOSIS — Z79811 Long term (current) use of aromatase inhibitors: Secondary | ICD-10-CM

## 2017-02-01 DIAGNOSIS — C7951 Secondary malignant neoplasm of bone: Secondary | ICD-10-CM

## 2017-02-01 DIAGNOSIS — J029 Acute pharyngitis, unspecified: Secondary | ICD-10-CM

## 2017-02-01 DIAGNOSIS — C50912 Malignant neoplasm of unspecified site of left female breast: Secondary | ICD-10-CM

## 2017-02-01 DIAGNOSIS — D6959 Other secondary thrombocytopenia: Secondary | ICD-10-CM

## 2017-02-01 MED ORDER — PALBOCICLIB 100 MG PO CAPS: 100.0000 mg | ORAL_CAPSULE | Freq: Every day | ORAL | 1 refills | Status: DC

## 2017-02-01 MED ORDER — AZITHROMYCIN 250 MG PO TABS: ORAL_TABLET | ORAL | 0 refills | Status: DC

## 2017-02-01 MED ORDER — SODIUM CHLORIDE 0.9 % IV SOLN
Freq: Once | INTRAVENOUS | Status: AC
  Administered 2017-02-01: 10:00:00 via INTRAVENOUS
  Filled 2017-02-01: qty 1000

## 2017-02-01 MED ORDER — ZOLEDRONIC ACID 4 MG/100ML IV SOLN
4.0000 mg | Freq: Once | INTRAVENOUS | Status: AC
  Administered 2017-02-01: 4 mg via INTRAVENOUS
  Filled 2017-02-01: qty 100

## 2017-02-01 MED ORDER — SODIUM CHLORIDE 0.9% FLUSH
10.0000 mL | INTRAVENOUS | Status: DC | PRN
  Administered 2017-02-01: 10 mL via INTRAVENOUS
  Filled 2017-02-01: qty 10

## 2017-02-01 MED ORDER — HEPARIN SOD (PORK) LOCK FLUSH 100 UNIT/ML IV SOLN
500.0000 [IU] | Freq: Once | INTRAVENOUS | Status: AC
  Administered 2017-02-01: 500 [IU] via INTRAVENOUS
  Filled 2017-02-01: qty 5

## 2017-02-01 NOTE — Telephone Encounter (Signed)
Oral Oncology Pharmacist Encounter  Received refill prescription for Ibrance for the treatment of metastatic breast cancer, planned duration until disease progression or unacceptable drug toxicity. Victoria Steele is being dose decreased to Ibrance 100mg   CBC/CMP from 01/28/17 assessed, no relevant lab abnormalities. Prescription dose and frequency assessed.   Current medication list in Epic reviewed,  DDIs with Ibrance identified: Ibrance may increase the concentrations of amlodipine and dexamethasone and the interaction should just be monitored. Victoria Steele has been on this combination and does not appear to be a problem.  Prescription has been e-scribed to the [facility] for benefits analysis and approval.  Patient education Counseled patient on administration, dosing, side effects, monitoring, drug-food interactions, safe handling, storage, and disposal. Patient will take 1 capsule (100 mg total) by mouth daily with breakfast. Take whole with food. 3 weeks on 1 week off.  Side effects include but not limited to: N/V/D, rash, pyrexia.    Reviewed with patient importance of keeping a medication schedule and plan for any missed doses.  Victoria Steele voiced understanding and appreciation. All questions answered.  Oral Oncology Clinic will continue to follow for insurance authorization, copayment issues, initial counseling and start date.  Provided patient with [facility] phone number. Patient knows to call the office with questions or concerns. Oral Chemotherapy Navigation Clinic will continue to follow.  Darl Pikes, PharmD, BCPS Hematology/Oncology Clinical Pharmacist [facility] [place] [facility] [phone]  02/01/2017 2:59 PM

## 2017-02-01 NOTE — Telephone Encounter (Signed)
Oral Oncology Patient Advocate Encounter   Patient has PAF copay assistance   ID# [id number] Bin# [id number] Group # [id number]  She has $847.97  Her copay is $575.91  We will get her signed up for manufacture assistance.   [place] Patient Advocate [phone] 02/01/2017 11:40 AM

## 2017-02-01 NOTE — Progress Notes (Signed)
Patient here for follow up with Zometa today. She had labs done this past Thursday; WBC has improved. She denies having any pain but complains of a persistent sore throat, which has not improved with Magic Mouthwash. She complains of new onset of hot flashes and reddening of her face that started a couple of weeks ago.

## 2017-02-01 NOTE — Telephone Encounter (Signed)
Oral Oncology Patient Advocate Encounter  Spoke with patient about her copay assistance thru [employer] only covering one more fill. We are going to send application for manufacture assistance. Patient is coming Tuesday (02/02/2017) to fill out application for manufacture assistance thru [employer].    Sent email to [facility] to please mail out her [facility] and told them the information for copay was already in system. Verified her address.    [place] Patient Advocate [phone] 02/01/2017 4:26 PM

## 2017-02-01 NOTE — Progress Notes (Signed)
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 Patient is a 76 year old female with no significant comorbidities who noticed left breast mass about 6-9 months ago. She thought it would go awaybut it continued to increase in size and began to involve her skin. She also noted tenderness to palpation as well as intermittent sharp tingling pain. She was seen by Dr. Adora Fridge on 08/25/2016 and patient had a biopsy of her skin in his office which showed:DIAGNOSIS:  A. LEFT BREAST SKIN; EXCISION:  - INVASIVE CARCINOMA MORPHOLOGICALLY CONSISTENT WITH MAMMARY ORIGIN  INVOLVING THE DERMIS.   BREAST BIOMARKER TESTS  Estrogen Receptor (ER) Status: POSITIVE, >90%  Progesterone Receptor (PgR) Status: POSITIVE, >70%  Her2 negative  2. Patient underwent bilateral diagnostic mammogram on 08/31/2016 showed:IMPRESSION: 1. Known left breast cancer, retroareolar with probable extension to the nipple, measuring 4.3 cm greatest dimension by ultrasound, with associated architectural distortion and associated left nipple retraction, and with associated diffuse skin thickening on the left. 2. No enlarged or morphologically abnormal lymph nodes are seen in the left axilla by ultrasound. 3. No evidence of malignancy within the right breast.  3. Patient was also complaining of some perirectal pain and underwent CT abdomen pelvis with contrast on 09/02/2016 which showed: IMPRESSION: Left colonic diverticulosis. No active diverticulitis.Fatty liver. No acute findings or evidence of metastatic  disease in the abdomen or pelvis. Diffuse bony sclerotic metastases.  4. Other than that her breast pain patient overall feels well. Denies any unintentional weight loss. She continues to be active and care for her great-grandchildren. She has had a hysterectomy in the past. No family history of breast or ovarian cancer.  5. Given that she had inflammatory breast cancer- plan was to give 4 cycles of dose dense AC followed by possible mastectomy and treat bone mets with AI + ibrance  6. PET CT scan on 09/11/16 showed: IMPRESSION: 1. Low-grade but abnormal hypermetabolic activity associated with the cutaneous and sub areolar glandular tissues of the left breast, compatible with malignancy, representative SUV 3.9 compared to contralateral normal side 1.4. 2. Diffuse sclerotic osseous metastatic disease with low-grade hypermetabolic activity, a representative SUV along the left sacrum 5.4. 3. Other imaging findings of potential clinical significance: Aortic Atherosclerosis (ICD10-I70.0). Descending and sigmoid colon Diverticulosis.  7. Baseline MUGA scan showed EF of 71%. Baseline CA 27.29 elevated at 563.2  8. 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  9. Letrozole and ibrance started in sept 2018. zometa monthly also started. Baseline bone density scan normal  10. ibrance held in oct 2018 after cycle 1 due to Grade 3 neutropenia and thrombocytopenia.   Interval history- she still has ongoing problems with sore throat that started 5 days ago. She feels her symptoms are going down the throat. No fever.  ECOG PS- 1 Pain scale- 0   Review of systems- Review of Systems  Constitutional: Negative for chills, fever, malaise/fatigue and weight loss.  HENT: Positive for sore throat. Negative for congestion, ear discharge and nosebleeds.   Eyes: Negative for blurred vision.  Respiratory: Negative for cough, hemoptysis, sputum production, shortness of breath and  wheezing.    Cardiovascular: Negative for chest pain, palpitations, orthopnea and claudication.  Gastrointestinal: Negative for abdominal pain, blood in stool, constipation, diarrhea, heartburn, melena, nausea and vomiting.  Genitourinary: Negative for dysuria, flank pain, frequency, hematuria and urgency.  Musculoskeletal: Negative for back pain, joint pain and myalgias.  Skin: Negative for rash.  Neurological: Negative for dizziness, tingling, focal weakness, seizures, weakness and headaches.  Endo/Heme/Allergies: Does not bruise/bleed easily.  Psychiatric/Behavioral: Negative for depression and suicidal ideas. The patient does not have insomnia.      No Known Allergies   Past Medical History:  Diagnosis Date  . Breast cancer ([place])   . Cancer ([place])    left breast  . Hypertension   . Port catheter in place 09/14/2016   Placed 09/10/2016 RT chest.     Past Surgical History:  Procedure Laterality Date  . ABDOMINAL HYSTERECTOMY    . APPENDECTOMY    . PORTACATH PLACEMENT Right 09/10/2016   Procedure: INSERTION PORT-A-CATH;  Surgeon: Jules Husbands, MD;  Location: [facility] ORS;  Service: General;  Laterality: Right;    Social History   Social History  . Marital status: Married    Spouse name: N/A  . Number of children: N/A  . Years of education: N/A   Occupational History  . Not on file.   Social History Main Topics  . Smoking status: Never Smoker  . Smokeless tobacco: Never Used  . Alcohol use No  . Drug use: No  . Sexual activity: No   Other Topics Concern  . Not on file   Social History Narrative  . No narrative on file    Family History  Problem Relation Age of Onset  . Parkinson's disease Mother   . Cancer Father      Current Outpatient Prescriptions:  .  acetaminophen (TYLENOL) 500 MG tablet, Take 1,000 mg by mouth every 6 (six) hours as needed for moderate pain., Disp: , Rfl:  .  amLODipine (NORVASC) 5 MG tablet, Take 1 tablet (5 mg total) by mouth daily., Disp: 30  tablet, Rfl: 0 .  dexamethasone (DECADRON) 4 MG tablet, Take 2 tablets by mouth once a day on the day after chemotherapy and then take 2 tablets two times a day for 2 days. Take with food., Disp: 30 tablet, Rfl: 1 .  diphenoxylate-atropine (LOMOTIL) 2.5-0.025 MG tablet, Take 1 tablet by mouth 4 (four) times daily as needed for diarrhea or loose stools., Disp: 30 tablet, Rfl: 0 .  hydrochlorothiazide (MICROZIDE) 12.5 MG capsule, 12.5 mg daily as needed. , Disp: , Rfl:  .  letrozole (FEMARA) 2.5 MG tablet, Take 1 tablet (2.5 mg total) by mouth daily., Disp: 90 tablet, Rfl: 3 .  lidocaine-prilocaine (EMLA) cream, Apply to affected area once, Disp: 30 g, Rfl: 3 .  LORazepam (ATIVAN) 0.5 MG tablet, Take 1 tablet (0.5 mg total) by mouth every 6 (six) hours as needed (Nausea or vomiting). (Patient taking differently: Take 0.5 mg by mouth every 6 (six) hours as needed. Nausea or vomiting), Disp: 30 tablet, Rfl: 0 .  magic mouthwash SOLN, Take 5 mLs by mouth 4 (four) times daily as needed for mouth pain., Disp: 240 mL, Rfl: 0 .  ondansetron (ZOFRAN) 8 MG tablet, Take 1 tablet (8 mg total) by mouth 2 (two) times daily as needed. Start on the third day after chemotherapy. (Patient taking differently: Take 8 mg by mouth 2 (two) times daily as needed for nausea. Start on the third day after chemotherapy.), Disp: 30  tablet, Rfl: 1 .  PREVIDENT 5000 BOOSTER PLUS 1.1 % PSTE, , Disp: , Rfl:  .  prochlorperazine (COMPAZINE) 10 MG tablet, Take 1 tablet (10 mg total) by mouth every 6 (six) hours as needed (Nausea or vomiting)., Disp: 30 tablet, Rfl: 1 .  azithromycin (ZITHROMAX Z-PAK) 250 MG tablet, 2 tablets on day 1 and then 1 tablet day 2, 3, and 4, Disp: 5 each, Rfl: 0 .  palbociclib (IBRANCE) 100 MG capsule, Take 1 capsule (100 mg total) by mouth daily with breakfast. Take whole with food. 3 weeks on 1 week off, Disp: 21 capsule, Rfl: 1 No current facility-administered medications for this visit.    Facility-Administered Medications Ordered in Other Visits:  .  heparin lock flush 100 unit/mL, 500 Units, Intravenous, Once, Sindy Guadeloupe, MD .  sodium chloride flush (NS) 0.9 % injection 10 mL, 10 mL, Intravenous, PRN, Sindy Guadeloupe, MD  Physical exam:  Vitals:   02/01/17 0952 02/01/17 0956  BP:  (!) 174/86  Pulse:  66  Resp:  16  Temp:  99.3 F (37.4 C)  TempSrc:  Tympanic  Weight: 196 lb 13.9 oz (89.3 kg)    Physical Exam  Constitutional: She is oriented to person, place, and time and well-developed, well-nourished, and in no distress.  HENT:  Head: Normocephalic and atraumatic.  Mild erythema noted over posterior oropharynx. No ulcerations, mouth sores  Eyes: Pupils are equal, round, and reactive to light. EOM are normal.  Neck: Normal range of motion.  Cardiovascular: Normal rate, regular rhythm and normal heart sounds.   Pulmonary/Chest: Effort normal and breath sounds normal.  Abdominal: Soft. Bowel sounds are normal.  Neurological: She is alert and oriented to person, place, and time.  Skin: Skin is warm and dry.   left breast mass has remained unchanged  CMP Latest Ref Rng & Units 01/28/2017  Glucose 65 - 99 mg/dL 139(H)  BUN 6 - 20 mg/dL 8  Creatinine 0.44 - 1.00 mg/dL 0.55  Sodium 135 - 145 mmol/L 139  Potassium 3.5 - 5.1 mmol/L 3.1(L)  Chloride 101 - 111 mmol/L 106  CO2 22 - 32 mmol/L 24  Calcium 8.9 - 10.3 mg/dL 9.2  Total Protein 6.5 - 8.1 g/dL 6.8  Total Bilirubin 0.3 - 1.2 mg/dL 0.7  Alkaline Phos 38 - 126 U/L 179(H)  AST 15 - 41 U/L 41  ALT 14 - 54 U/L 35   CBC Latest Ref Rng & Units 01/28/2017  WBC 3.6 - 11.0 K/uL 2.7(L)  Hemoglobin 12.0 - 16.0 g/dL 11.9(L)  Hematocrit 35.0 - 47.0 % 34.8(L)  Platelets 150 - 440 K/uL 334      Assessment and plan- Patient is a 76 y.o. female with metastatic breast cancer and bone only mets on ibrance  Neutropenia and thrombocytopenia have recovered. She is still leukopenia but mainly lymphopenia. However  it has taken almost 3 weeks for her counts to recover. I will therefore restart ibrance at lower dose 100 mg. Repeat cbc with diff weekly X2 and I will see her in 2 weeks  Zometa today  She will continue her letrozole. CA 27 29 is trending down. I will repeat scans after 3 cycles of ibrance  Since her breast mass has shrunken since presentation and no overt concern for ulceration, I do not think she will benefit from breast surgery in the future  Sore throat- no signs of infection. Given ongoing symptoms for 5 days and because I am restarting ibrance today with potential for  neutropenia, I will give her 5 days of Z pak   Visit Diagnosis 1. Malignant neoplasm of left breast in female, estrogen receptor positive, unspecified site of breast ([place])   2. Bone metastases ([place])      Dr. Randa Evens, MD, MPH [facility] Pager- [phone] 02/01/2017 11:01 AM

## 2017-02-02 MED FILL — IBRANCE 100 MG CAPSULE: 100 | 28 days supply | Qty: 21 | Fill #0

## 2017-02-03 NOTE — Telephone Encounter (Signed)
Oral Oncology Patient Advocate Encounter  Met patient in lobby to complete application for [employer] in an effort to reduce patient's out of pocket expense for Ibrance to $0.    Application completed and faxed to [phone] mailed 02/11/2017.   Patient assistance phone number for follow up is [phone].   This encounter will be updated until final determination.   [place] Patient Advocate [phone] 02/03/2017 10:43 AM

## 2017-02-04 ENCOUNTER — Telehealth: Payer: Self-pay | Admitting: *Deleted

## 2017-02-04 NOTE — Telephone Encounter (Signed)
Patient called to inform us that she started her Ibrance 100 mg this morning.    dhs

## 2017-02-08 ENCOUNTER — Inpatient Hospital Stay: Payer: Medicare Other

## 2017-02-08 DIAGNOSIS — C50912 Malignant neoplasm of unspecified site of left female breast: Secondary | ICD-10-CM

## 2017-02-08 DIAGNOSIS — Z170 Estrogen receptor positive status [ER+]: Principal | ICD-10-CM

## 2017-02-08 DIAGNOSIS — C7951 Secondary malignant neoplasm of bone: Secondary | ICD-10-CM

## 2017-02-08 LAB — CBC WITH DIFFERENTIAL/PLATELET
Basophils Absolute: 0.1 10*3/uL (ref 0–0.1)
Basophils Relative: 2 %
EOS ABS: 0.1 10*3/uL (ref 0–0.7)
EOS PCT: 3 %
HCT: 39.9 % (ref 35.0–47.0)
Hemoglobin: 13.4 g/dL (ref 12.0–16.0)
LYMPHS ABS: 0.9 10*3/uL — AB (ref 1.0–3.6)
Lymphocytes Relative: 29 %
MCH: 32.8 pg (ref 26.0–34.0)
MCHC: 33.7 g/dL (ref 32.0–36.0)
MCV: 97.2 fL (ref 80.0–100.0)
MONO ABS: 0.1 10*3/uL — AB (ref 0.2–0.9)
Monocytes Relative: 5 %
Neutro Abs: 1.9 10*3/uL (ref 1.4–6.5)
Neutrophils Relative %: 61 %
PLATELETS: 313 10*3/uL (ref 150–440)
RBC: 4.1 MIL/uL (ref 3.80–5.20)
RDW: 17.8 % — AB (ref 11.5–14.5)
WBC: 3 10*3/uL — ABNORMAL LOW (ref 3.6–11.0)

## 2017-02-15 ENCOUNTER — Encounter: Payer: Self-pay | Admitting: Oncology

## 2017-02-15 ENCOUNTER — Inpatient Hospital Stay: Payer: Medicare Other | Attending: Oncology | Admitting: Oncology

## 2017-02-15 ENCOUNTER — Inpatient Hospital Stay: Payer: Medicare Other

## 2017-02-15 VITALS — BP 145/80 | HR 64 | Temp 98.6°F | Resp 16 | Wt 197.0 lb

## 2017-02-15 DIAGNOSIS — Z9221 Personal history of antineoplastic chemotherapy: Secondary | ICD-10-CM | POA: Diagnosis not present

## 2017-02-15 DIAGNOSIS — D702 Other drug-induced agranulocytosis: Secondary | ICD-10-CM | POA: Diagnosis not present

## 2017-02-15 DIAGNOSIS — C50912 Malignant neoplasm of unspecified site of left female breast: Secondary | ICD-10-CM

## 2017-02-15 DIAGNOSIS — K5730 Diverticulosis of large intestine without perforation or abscess without bleeding: Secondary | ICD-10-CM | POA: Diagnosis not present

## 2017-02-15 DIAGNOSIS — Z90710 Acquired absence of both cervix and uterus: Secondary | ICD-10-CM

## 2017-02-15 DIAGNOSIS — C7951 Secondary malignant neoplasm of bone: Secondary | ICD-10-CM

## 2017-02-15 DIAGNOSIS — I10 Essential (primary) hypertension: Secondary | ICD-10-CM | POA: Diagnosis not present

## 2017-02-15 DIAGNOSIS — Z170 Estrogen receptor positive status [ER+]: Secondary | ICD-10-CM | POA: Diagnosis not present

## 2017-02-15 DIAGNOSIS — T451X5S Adverse effect of antineoplastic and immunosuppressive drugs, sequela: Secondary | ICD-10-CM | POA: Diagnosis not present

## 2017-02-15 DIAGNOSIS — K760 Fatty (change of) liver, not elsewhere classified: Secondary | ICD-10-CM | POA: Diagnosis not present

## 2017-02-15 DIAGNOSIS — Z79811 Long term (current) use of aromatase inhibitors: Secondary | ICD-10-CM

## 2017-02-15 DIAGNOSIS — G893 Neoplasm related pain (acute) (chronic): Secondary | ICD-10-CM | POA: Diagnosis not present

## 2017-02-15 DIAGNOSIS — Z79899 Other long term (current) drug therapy: Secondary | ICD-10-CM | POA: Insufficient documentation

## 2017-02-15 LAB — CBC WITH DIFFERENTIAL/PLATELET
Basophils Absolute: 0 10*3/uL (ref 0–0.1)
Basophils Relative: 1 %
Eosinophils Absolute: 0 10*3/uL (ref 0–0.7)
Eosinophils Relative: 2 %
HCT: 38 % (ref 35.0–47.0)
Hemoglobin: 12.9 g/dL (ref 12.0–16.0)
LYMPHS ABS: 0.9 10*3/uL — AB (ref 1.0–3.6)
LYMPHS PCT: 44 %
MCH: 33.1 pg (ref 26.0–34.0)
MCHC: 34.1 g/dL (ref 32.0–36.0)
MCV: 97 fL (ref 80.0–100.0)
Monocytes Absolute: 0.1 10*3/uL — ABNORMAL LOW (ref 0.2–0.9)
Monocytes Relative: 4 %
NEUTROS PCT: 49 %
Neutro Abs: 1 10*3/uL — ABNORMAL LOW (ref 1.4–6.5)
Platelets: 240 10*3/uL (ref 150–440)
RBC: 3.91 MIL/uL (ref 3.80–5.20)
RDW: 17.2 % — ABNORMAL HIGH (ref 11.5–14.5)
WBC: 2.1 10*3/uL — AB (ref 3.6–11.0)

## 2017-02-15 LAB — BASIC METABOLIC PANEL
Anion gap: 7 (ref 5–15)
BUN: 9 mg/dL (ref 6–20)
CHLORIDE: 105 mmol/L (ref 101–111)
CO2: 26 mmol/L (ref 22–32)
Calcium: 9.4 mg/dL (ref 8.9–10.3)
Creatinine, Ser: 0.72 mg/dL (ref 0.44–1.00)
GFR calc Af Amer: >60 mL/min (ref >60)
GFR calc non Af Amer: >60 mL/min (ref >60)
GLUCOSE: 110 mg/dL — AB (ref 65–99)
POTASSIUM: 3.9 mmol/L (ref 3.5–5.1)
Sodium: 138 mmol/L (ref 135–145)

## 2017-02-15 NOTE — Progress Notes (Signed)
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 Patient is a 76 year old female with no significant comorbidities who noticed left breast mass about 6-9 months ago. She thought it would go awaybut it continued to increase in size and began to involve her skin. She also noted tenderness to palpation as well as intermittent sharp tingling pain. She was seen by Dr. Adora Fridge on 08/25/2016 and patient had a biopsy of her skin in his office which showed:DIAGNOSIS:  A. LEFT BREAST SKIN; EXCISION:  - INVASIVE CARCINOMA MORPHOLOGICALLY CONSISTENT WITH MAMMARY ORIGIN  INVOLVING THE DERMIS.   BREAST BIOMARKER TESTS  Estrogen Receptor (ER) Status: POSITIVE, >90%  Progesterone Receptor (PgR) Status: POSITIVE, >70%  Her2 negative  2. Patient underwent bilateral diagnostic mammogram on 08/31/2016 showed:IMPRESSION: 1. Known left breast cancer, retroareolar with probable extension to the nipple, measuring 4.3 cm greatest dimension by ultrasound, with associated architectural distortion and associated left nipple retraction, and with associated diffuse skin thickening on the left. 2. No enlarged or morphologically abnormal lymph nodes are seen in the left axilla by ultrasound. 3. No evidence of malignancy within the right breast.  3. Patient was also complaining of some perirectal pain and underwent CT abdomen pelvis with contrast on 09/02/2016 which showed: IMPRESSION: Left colonic diverticulosis. No active diverticulitis.Fatty liver. No acute findings or evidence of metastatic  disease in the abdomen or pelvis. Diffuse bony sclerotic metastases.  4. Other than that her breast pain patient overall feels well. Denies any unintentional weight loss. She continues to be active and care for her great-grandchildren. She has had a hysterectomy in the past. No family history of breast or ovarian cancer.  5. Given that she had inflammatory breast cancer- plan was to give 4 cycles of dose dense AC followed by possible mastectomy and treat bone mets with AI + ibrance  6. PET CT scan on 09/11/16 showed: IMPRESSION: 1. Low-grade but abnormal hypermetabolic activity associated with the cutaneous and sub areolar glandular tissues of the left breast, compatible with malignancy, representative SUV 3.9 compared to contralateral normal side 1.4. 2. Diffuse sclerotic osseous metastatic disease with low-grade hypermetabolic activity, a representative SUV along the left sacrum 5.4. 3. Other imaging findings of potential clinical significance: Aortic Atherosclerosis (ICD10-I70.0). Descending and sigmoid colon Diverticulosis.  7. Baseline MUGA scan showed EF of 71%. Baseline CA 27.29 elevated at 563.2  8. 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  9. Letrozole and ibrance started in sept 2018. zometa monthly also started. Baseline bone density scan normal  10. ibrance held in oct 2018 after cycle 1 due to Grade 3 neutropenia and thrombocytopenia. cycle 2 restaretd on 02/04/17 at 100 mg    Interval history- patient is feeling well today. Her symptoms of cough and sore troat improved after Z pack. She has runny nose since few days. Denies any fever  ECOG PS- 0 Pain scale- 0   Review of systems- Review of Systems  Constitutional: Negative for chills, fever, malaise/fatigue and weight loss.  HENT: Negative for congestion, ear discharge and nosebleeds.   Eyes: Negative for blurred vision.  Respiratory: Negative for cough,  hemoptysis, sputum production,  shortness of breath and wheezing.   Cardiovascular: Negative for chest pain, palpitations, orthopnea and claudication.  Gastrointestinal: Negative for abdominal pain, blood in stool, constipation, diarrhea, heartburn, melena, nausea and vomiting.  Genitourinary: Negative for dysuria, flank pain, frequency, hematuria and urgency.  Musculoskeletal: Negative for back pain, joint pain and myalgias.  Skin: Negative for rash.  Neurological: Negative for dizziness, tingling, focal weakness, seizures, weakness and headaches.  Endo/Heme/Allergies: Does not bruise/bleed easily.  Psychiatric/Behavioral: Negative for depression and suicidal ideas. The patient does not have insomnia.       No Known Allergies   Past Medical History:  Diagnosis Date  . Breast cancer ([place])   . Cancer ([place])    left breast  . Hypertension   . Port catheter in place 09/14/2016   Placed 09/10/2016 RT chest.     Past Surgical History:  Procedure Laterality Date  . ABDOMINAL HYSTERECTOMY    . APPENDECTOMY      Social History   Socioeconomic History  . Marital status: Married    Spouse name: Not on file  . Number of children: Not on file  . Years of education: Not on file  . Highest education level: Not on file  Social Needs  . Financial resource strain: Not on file  . Food insecurity - worry: Not on file  . Food insecurity - inability: Not on file  . Transportation needs - medical: Not on file  . Transportation needs - non-medical: Not on file  Occupational History  . Not on file  Tobacco Use  . Smoking status: Never Smoker  . Smokeless tobacco: Never Used  Substance and Sexual Activity  . Alcohol use: No  . Drug use: No  . Sexual activity: No  Other Topics Concern  . Not on file  Social History Narrative  . Not on file    Family History  Problem Relation Age of Onset  . Parkinson's disease Mother   . Cancer Father      Current Outpatient Medications:  .  acetaminophen (TYLENOL) 500 MG  tablet, Take 1,000 mg by mouth every 6 (six) hours as needed for moderate pain., Disp: , Rfl:  .  amLODipine (NORVASC) 5 MG tablet, Take 1 tablet (5 mg total) by mouth daily., Disp: 30 tablet, Rfl: 0 .  azithromycin (ZITHROMAX Z-PAK) 250 MG tablet, 2 tablets on day 1 and then 1 tablet day 2, 3, and 4, Disp: 5 each, Rfl: 0 .  dexamethasone (DECADRON) 4 MG tablet, Take 2 tablets by mouth once a day on the day after chemotherapy and then take 2 tablets two times a day for 2 days. Take with food., Disp: 30 tablet, Rfl: 1 .  diphenoxylate-atropine (LOMOTIL) 2.5-0.025 MG tablet, Take 1 tablet by mouth 4 (four) times daily as needed for diarrhea or loose stools., Disp: 30 tablet, Rfl: 0 .  hydrochlorothiazide (MICROZIDE) 12.5 MG capsule, 12.5 mg daily as needed. , Disp: , Rfl:  .  letrozole (FEMARA) 2.5 MG tablet, Take 1 tablet (2.5 mg total) by mouth daily., Disp: 90 tablet, Rfl: 3 .  lidocaine-prilocaine (EMLA) cream, Apply to affected area once, Disp: 30 g, Rfl: 3 .  LORazepam (ATIVAN) 0.5 MG tablet, Take 1 tablet (0.5 mg total) by mouth every 6 (six) hours as needed (Nausea or vomiting). (Patient taking differently: Take 0.5 mg by mouth every 6 (six) hours as needed. Nausea or vomiting), Disp: 30 tablet, Rfl: 0 .  magic mouthwash SOLN, Take 5 mLs by mouth  4 (four) times daily as needed for mouth pain., Disp: 240 mL, Rfl: 0 .  ondansetron (ZOFRAN) 8 MG tablet, Take 1 tablet (8 mg total) by mouth 2 (two) times daily as needed. Start on the third day after chemotherapy. (Patient taking differently: Take 8 mg by mouth 2 (two) times daily as needed for nausea. Start on the third day after chemotherapy.), Disp: 30 tablet, Rfl: 1 .  palbociclib (IBRANCE) 100 MG capsule, Take 1 capsule (100 mg total) by mouth daily with breakfast. Take whole with food. 3 weeks on 1 week off, Disp: 21 capsule, Rfl: 1 .  PREVIDENT 5000 BOOSTER PLUS 1.1 % PSTE, , Disp: , Rfl:  .  prochlorperazine (COMPAZINE) 10 MG tablet, Take 1  tablet (10 mg total) by mouth every 6 (six) hours as needed (Nausea or vomiting)., Disp: 30 tablet, Rfl: 1  Physical exam:  Vitals:   02/15/17 1012  BP: (!) 145/80  Pulse: 64  Resp: 16  Temp: 98.6 F (37 C)  TempSrc: Tympanic  Weight: 197 lb (89.4 kg)   Physical Exam  Constitutional: She is oriented to person, place, and time and well-developed, well-nourished, and in no distress.  HENT:  Head: Normocephalic and atraumatic.  Mouth/Throat: Oropharynx is clear and moist.  Eyes: EOM are normal. Pupils are equal, round, and reactive to light.  Neck: Normal range of motion.  Cardiovascular: Normal rate, regular rhythm and normal heart sounds.  Pulmonary/Chest: Effort normal and breath sounds normal.  Abdominal: Soft. Bowel sounds are normal.  Neurological: She is alert and oriented to person, place, and time.  Skin: Skin is warm and dry.     CMP Latest Ref Rng & Units 02/15/2017  Glucose 65 - 99 mg/dL 110(H)  BUN 6 - 20 mg/dL 9  Creatinine 0.44 - 1.00 mg/dL 0.72  Sodium 135 - 145 mmol/L 138  Potassium 3.5 - 5.1 mmol/L 3.9  Chloride 101 - 111 mmol/L 105  CO2 22 - 32 mmol/L 26  Calcium 8.9 - 10.3 mg/dL 9.4  Total Protein 6.5 - 8.1 g/dL -  Total Bilirubin 0.3 - 1.2 mg/dL -  Alkaline Phos 38 - 126 U/L -  AST 15 - 41 U/L -  ALT 14 - 54 U/L -   CBC Latest Ref Rng & Units 02/15/2017  WBC 3.6 - 11.0 K/uL 2.1(L)  Hemoglobin 12.0 - 16.0 g/dL 12.9  Hematocrit 35.0 - 47.0 % 38.0  Platelets 150 - 440 K/uL 240      Assessment and plan- Patient is a 76 y.o. female with Stage IV ER PR positive her 2 negative breast cancer with bone only mets  This is her cycle 2 week 2 (day 11)  of ibrance at 100 mg. She has f=grade 2 neutropenia ANC 1.0. Continue same dose of ibrance and I will repeat CBC on day 22 cycle 2.  Repeat CBC with diff on 02/25/17 and 03/02/17 along with cmp and ca 27.29. If counts are stable, she will proceed with cycle 3 of ibrance starting 03/04/17 at same dose of 100  mg. I will see her on 03/02/17. I will plan to repeat scans after 3 cycles of Ibrance. Tumor markers are trending down. Xgeva on 03/02/17    Visit Diagnosis 1. Bone metastases ([place])   2. Malignant neoplasm of left breast in female, estrogen receptor positive, unspecified site of breast ([place])   3. High risk medication use   4. Drug-induced neutropenia ([facility])      Dr. Randa Evens, MD, MPH [facility]  at [facility] Pager- [phone] 02/15/2017 9:57 AM

## 2017-02-15 NOTE — Progress Notes (Signed)
Patient is here today for follow up with labs. She is feeling well today. She states that she is still having the hot flashes, where her face gets really red. Her PMD has increased her BP medication from 5 mg to 10 mg. She denies having any pain.

## 2017-02-25 ENCOUNTER — Inpatient Hospital Stay: Payer: Medicare Other

## 2017-02-25 DIAGNOSIS — C50912 Malignant neoplasm of unspecified site of left female breast: Secondary | ICD-10-CM | POA: Diagnosis not present

## 2017-02-25 DIAGNOSIS — Z170 Estrogen receptor positive status [ER+]: Secondary | ICD-10-CM

## 2017-02-25 DIAGNOSIS — C7951 Secondary malignant neoplasm of bone: Secondary | ICD-10-CM

## 2017-02-25 LAB — CBC WITH DIFFERENTIAL/PLATELET
Basophils Absolute: 0 10*3/uL (ref 0–0.1)
Basophils Relative: 2 %
Eosinophils Absolute: 0 10*3/uL (ref 0–0.7)
Eosinophils Relative: 2 %
HEMATOCRIT: 38.7 % (ref 35.0–47.0)
Hemoglobin: 13.2 g/dL (ref 12.0–16.0)
Lymphocytes Relative: 50 %
Lymphs Abs: 0.9 10*3/uL — ABNORMAL LOW (ref 1.0–3.6)
MCH: 33.1 pg (ref 26.0–34.0)
MCHC: 34.2 g/dL (ref 32.0–36.0)
MCV: 96.8 fL (ref 80.0–100.0)
MONO ABS: 0.1 10*3/uL — AB (ref 0.2–0.9)
MONOS PCT: 7 %
NEUTROS ABS: 0.7 10*3/uL — AB (ref 1.4–6.5)
Neutrophils Relative %: 39 %
Platelets: 157 10*3/uL (ref 150–440)
RBC: 4 MIL/uL (ref 3.80–5.20)
RDW: 18.1 % — AB (ref 11.5–14.5)
WBC: 1.7 10*3/uL — ABNORMAL LOW (ref 3.6–11.0)

## 2017-03-01 ENCOUNTER — Telehealth: Payer: Self-pay | Admitting: Oncology

## 2017-03-01 NOTE — Progress Notes (Signed)
Hematology/Oncology Consult note [facility]  Telephone:([phone] Fax:([phone]  Patient Care Team: Letta Median, MD as PCP - General (Family Medicine)   Name of the patient: Victoria Steele  [id number]  1940/12/29   Date of visit: 03/01/17  Diagnosis- Stage IV invasive mammary carcinoma [id number] with bone only metastases  Chief complaint/ Reason for visit- f/u of breast cancer on letrozole plus ibrance  Heme/Onc history:1. Patient is a 76 year old female with no significant comorbidities who noticed left breast mass about 6-9 months ago. She thought it would go awaybut it continued to increase in size and began to involve her skin. She also noted tenderness to palpation as well as intermittent sharp tingling pain. She was seen by Dr. Adora Fridge on 08/25/2016 and patient had a biopsy of her skin in his office which showed:DIAGNOSIS:  A. LEFT BREAST SKIN; EXCISION:  - INVASIVE CARCINOMA MORPHOLOGICALLY CONSISTENT WITH MAMMARY ORIGIN  INVOLVING THE DERMIS.   BREAST BIOMARKER TESTS  Estrogen Receptor (ER) Status: POSITIVE, >90%  Progesterone Receptor (PgR) Status: POSITIVE, >70%  Her2 negative  2. Patient underwent bilateral diagnostic mammogram on 08/31/2016 showed:IMPRESSION: 1. Known left breast cancer, retroareolar with probable extension to the nipple, measuring 4.3 cm greatest dimension by ultrasound, with associated architectural distortion and associated left nipple retraction, and with associated diffuse skin thickening on the left. 2. No enlarged or morphologically abnormal lymph nodes are seen in the left axilla by ultrasound. 3. No evidence of malignancy within the right breast.  3. Patient was also complaining of some perirectal pain and underwent CT abdomen pelvis with contrast on 09/02/2016 which showed: IMPRESSION: Left colonic diverticulosis. No active diverticulitis.Fatty liver. No acute findings or evidence of metastatic  disease in the abdomen or pelvis. Diffuse bony sclerotic metastases.  4. Other than that her breast pain patient overall feels well. Denies any unintentional weight loss. She continues to be active and care for her great-grandchildren. She has had a hysterectomy in the past. No family history of breast or ovarian cancer.  5. Given that she had inflammatory breast cancer- plan was to give 4 cycles of dose dense AC followed by possible mastectomy and treat bone mets with AI + ibrance  6. PET CT scan on 09/11/16 showed: IMPRESSION: 1. Low-grade but abnormal hypermetabolic activity associated with the cutaneous and sub areolar glandular tissues of the left breast, compatible with malignancy, representative SUV 3.9 compared to contralateral normal side 1.4. 2. Diffuse sclerotic osseous metastatic disease with low-grade hypermetabolic activity, a representative SUV along the left sacrum 5.4. 3. Other imaging findings of potential clinical significance: Aortic Atherosclerosis (ICD10-I70.0). Descending and sigmoid colon Diverticulosis.  7. Baseline MUGA scan showed EF of 71%. Baseline CA 27.29 elevated at 563.2  8. 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  9. Letrozole and ibrance started in sept 2018. zometa monthly also started. Baseline bone density scan normal  10. ibrance held in oct 2018 after cycle 1 due to Grade 3 neutropenia and thrombocytopenia.cycle 2 restaretd on 02/04/17 at 100 mg    Interval history- she is doing well. Reports no fatigue or fever. She is taking her letrozole daily  ECOG PS- 0 Pain scale- 0  Review of systems- Review of Systems  Constitutional: Negative for chills, fever, malaise/fatigue and weight loss.  HENT: Negative for congestion, ear discharge and nosebleeds.   Eyes: Negative for blurred vision.  Respiratory: Negative for cough, hemoptysis, sputum production, shortness of breath and wheezing.   Cardiovascular: Negative for  chest pain, palpitations, orthopnea and claudication.  Gastrointestinal: Negative for abdominal pain, blood in stool, constipation, diarrhea, heartburn, melena, nausea and vomiting.  Genitourinary: Negative for dysuria, flank pain, frequency, hematuria and urgency.  Musculoskeletal: Negative for back pain, joint pain and myalgias.  Skin: Negative for rash.  Neurological: Negative for dizziness, tingling, focal weakness, seizures, weakness and headaches.  Endo/Heme/Allergies: Does not bruise/bleed easily.  Psychiatric/Behavioral: Negative for depression and suicidal ideas. The patient does not have insomnia.       No Known Allergies   Past Medical History:  Diagnosis Date  . Breast cancer ([place])   . Cancer ([place])    left breast  . Hypertension   . Port catheter in place 09/14/2016   Placed 09/10/2016 RT chest.     Past Surgical History:  Procedure Laterality Date  . ABDOMINAL HYSTERECTOMY    . APPENDECTOMY    . BREAST BIOPSY    . CYST EXCISION Right 09/2016   Buttocks  . INSERTION PORT-A-CATH Right 09/10/2016   Performed by Jules Husbands, MD at [facility] ORS    Social History   Socioeconomic History  . Marital status: Married    Spouse name: Not on file  . Number of children: Not on file  . Years of education: Not on file  . Highest education level: Not on file  Social Needs  . Financial resource strain: Not on file  . Food insecurity - worry: Not on file  . Food insecurity - inability: Not on file  . Transportation needs - medical: Not on file  . Transportation needs - non-medical: Not on file  Occupational History  . Not on file  Tobacco Use  . Smoking status: Never Smoker  . Smokeless tobacco: Never Used  Substance and Sexual Activity  . Alcohol use: No  . Drug use: No  . Sexual activity: No  Other Topics Concern  . Not on file  Social History Narrative  . Not on file    Family History  Problem Relation Age of Onset  . Parkinson's disease Mother   .  Cancer Father      Current Outpatient Medications:  .  acetaminophen (TYLENOL) 500 MG tablet, Take 1,000 mg by mouth every 6 (six) hours as needed for moderate pain., Disp: , Rfl:  .  amLODipine (NORVASC) 5 MG tablet, Take 1 tablet (5 mg total) by mouth daily. (Patient taking differently: Take 10 mg daily by mouth. ), Disp: 30 tablet, Rfl: 0 .  dexamethasone (DECADRON) 4 MG tablet, Take 2 tablets by mouth once a day on the day after chemotherapy and then take 2 tablets two times a day for 2 days. Take with food., Disp: 30 tablet, Rfl: 1 .  diphenoxylate-atropine (LOMOTIL) 2.5-0.025 MG tablet, Take 1 tablet by mouth 4 (four) times daily as needed for diarrhea or loose stools., Disp: 30 tablet, Rfl: 0 .  hydrochlorothiazide (MICROZIDE) 12.5 MG capsule, 12.5 mg daily as needed. , Disp: , Rfl:  .  letrozole (FEMARA) 2.5 MG tablet, Take 1 tablet (2.5 mg total) by mouth daily., Disp: 90 tablet, Rfl: 3 .  lidocaine-prilocaine (EMLA) cream, Apply to affected area once, Disp: 30 g, Rfl: 3 .  LORazepam (ATIVAN) 0.5 MG tablet, Take 1 tablet (0.5 mg total) by mouth every 6 (six) hours as needed (Nausea or vomiting). (Patient taking differently: Take 0.5 mg by mouth every 6 (six) hours as needed. Nausea or vomiting), Disp: 30 tablet, Rfl: 0 .  magic mouthwash SOLN, Take 5 mLs by  mouth 4 (four) times daily as needed for mouth pain., Disp: 240 mL, Rfl: 0 .  ondansetron (ZOFRAN) 8 MG tablet, Take 1 tablet (8 mg total) by mouth 2 (two) times daily as needed. Start on the third day after chemotherapy. (Patient taking differently: Take 8 mg by mouth 2 (two) times daily as needed for nausea. Start on the third day after chemotherapy.), Disp: 30 tablet, Rfl: 1 .  palbociclib (IBRANCE) 100 MG capsule, Take 1 capsule (100 mg total) by mouth daily with breakfast. Take whole with food. 3 weeks on 1 week off, Disp: 21 capsule, Rfl: 1 .  PREVIDENT 5000 BOOSTER PLUS 1.1 % PSTE, , Disp: , Rfl:  .  prochlorperazine (COMPAZINE)  10 MG tablet, Take 1 tablet (10 mg total) by mouth every 6 (six) hours as needed (Nausea or vomiting)., Disp: 30 tablet, Rfl: 1  Physical exam:  Vitals:   03/02/17 1342  BP: (!) 157/90  Pulse: 77  Temp: 98.1 F (36.7 C)  TempSrc: Tympanic  Weight: 197 lb 4.8 oz (89.5 kg)   Physical Exam  Constitutional: She is oriented to person, place, and time and well-developed, well-nourished, and in no distress.  HENT:  Head: Normocephalic and atraumatic.  Eyes: EOM are normal. Pupils are equal, round, and reactive to light.  Neck: Normal range of motion.  Cardiovascular: Normal rate, regular rhythm and normal heart sounds.  Pulmonary/Chest: Effort normal and breath sounds normal.  Abdominal: Soft. Bowel sounds are normal.  Neurological: She is alert and oriented to person, place, and time.  Skin: Skin is warm and dry.     CMP Latest Ref Rng & Units 03/02/2017  Glucose 65 - 99 mg/dL 134(H)  BUN 6 - 20 mg/dL 11  Creatinine 0.44 - 1.00 mg/dL 0.77  Sodium 135 - 145 mmol/L 136  Potassium 3.5 - 5.1 mmol/L 3.6  Chloride 101 - 111 mmol/L 101  CO2 22 - 32 mmol/L 25  Calcium 8.9 - 10.3 mg/dL 9.8  Total Protein 6.5 - 8.1 g/dL 7.2  Total Bilirubin 0.3 - 1.2 mg/dL 0.9  Alkaline Phos 38 - 126 U/L 132(H)  AST 15 - 41 U/L 32  ALT 14 - 54 U/L 24   CBC Latest Ref Rng & Units 03/02/2017  WBC 3.6 - 11.0 K/uL 1.9(L)  Hemoglobin 12.0 - 16.0 g/dL 12.5  Hematocrit 35.0 - 47.0 % 35.8  Platelets 150 - 440 K/uL 176      Assessment and plan- Patient is a 76 y.o. female with Stage IV ER PR positive her 2 negative breast cancer with bone only mets  Patient has completed 2 cycles of Ibrance so far.  This is her week off treatment and she is supposed to start cycle 3 on 03/04/2017. However cbc is 1.9 today and ANC is 0.9 which is grdae 3 neutropenia. Hold ibrance until ANC >1. Repeat cbc with diff in 1 week. We will call her with results and if ANC >1 she will start ibrance at lower dose of 75 mg. Continue  letrozole  zometa today   I will see her back in 4 weeks on 04/05/2017 with CBC CMP and CA-27-29 in [place]. She will get zometa on that day    She will get CT chest abdomen and pelvis with contrast and bone scan prior.  I will check interim CBC on 03/18/2017   Visit Diagnosis 1. Bone metastases ([place])   2. Malignant neoplasm of left breast in female, estrogen receptor positive, unspecified site of breast ([place])  3. Drug-induced neutropenia ([place])      Dr. Randa Evens, MD, MPH [facility] at [facility] Pager- [phone] 03/02/2017 2:02 PM

## 2017-03-01 NOTE — Telephone Encounter (Signed)
Oral Oncology Patient Advocate Encounter  Received notification from [place] Patient Assistance program that patient has been successfully enrolled into their program to receive Ibrance from the manufacturer at $0 out of pocket until 04/12/2017.   I called and spoke with patient.  She knows we will have to re-apply.   Patient knows to call the office with questions or concerns.  Oral Oncology Clinic will continue to follow.  [place] Patient Advocate [phone] 03/01/2017 2:31 PM

## 2017-03-02 ENCOUNTER — Other Ambulatory Visit: Payer: Self-pay

## 2017-03-02 ENCOUNTER — Inpatient Hospital Stay: Payer: Medicare Other | Admitting: Oncology

## 2017-03-02 ENCOUNTER — Ambulatory Visit: Payer: Medicare Other | Admitting: Oncology

## 2017-03-02 ENCOUNTER — Inpatient Hospital Stay: Payer: Medicare Other

## 2017-03-02 ENCOUNTER — Telehealth: Payer: Self-pay | Admitting: Pharmacist

## 2017-03-02 ENCOUNTER — Encounter: Payer: Self-pay | Admitting: Oncology

## 2017-03-02 VITALS — BP 157/90 | HR 77 | Temp 98.1°F | Wt 197.3 lb

## 2017-03-02 DIAGNOSIS — Z9221 Personal history of antineoplastic chemotherapy: Secondary | ICD-10-CM | POA: Diagnosis not present

## 2017-03-02 DIAGNOSIS — Z170 Estrogen receptor positive status [ER+]: Secondary | ICD-10-CM

## 2017-03-02 DIAGNOSIS — C50912 Malignant neoplasm of unspecified site of left female breast: Secondary | ICD-10-CM

## 2017-03-02 DIAGNOSIS — C7951 Secondary malignant neoplasm of bone: Secondary | ICD-10-CM

## 2017-03-02 DIAGNOSIS — Z79899 Other long term (current) drug therapy: Secondary | ICD-10-CM

## 2017-03-02 DIAGNOSIS — T451X5S Adverse effect of antineoplastic and immunosuppressive drugs, sequela: Secondary | ICD-10-CM | POA: Diagnosis not present

## 2017-03-02 DIAGNOSIS — G893 Neoplasm related pain (acute) (chronic): Secondary | ICD-10-CM | POA: Diagnosis not present

## 2017-03-02 DIAGNOSIS — D702 Other drug-induced agranulocytosis: Secondary | ICD-10-CM | POA: Diagnosis not present

## 2017-03-02 LAB — COMPREHENSIVE METABOLIC PANEL
ALBUMIN: 4.3 g/dL (ref 3.5–5.0)
ALT: 24 U/L (ref 14–54)
ANION GAP: 10 (ref 5–15)
AST: 32 U/L (ref 15–41)
Alkaline Phosphatase: 132 U/L — ABNORMAL HIGH (ref 38–126)
BILIRUBIN TOTAL: 0.9 mg/dL (ref 0.3–1.2)
BUN: 11 mg/dL (ref 6–20)
CHLORIDE: 101 mmol/L (ref 101–111)
CO2: 25 mmol/L (ref 22–32)
Calcium: 9.8 mg/dL (ref 8.9–10.3)
Creatinine, Ser: 0.77 mg/dL (ref 0.44–1.00)
GFR calc Af Amer: >60 mL/min (ref >60)
GFR calc non Af Amer: >60 mL/min (ref >60)
GLUCOSE: 134 mg/dL — AB (ref 65–99)
POTASSIUM: 3.6 mmol/L (ref 3.5–5.1)
SODIUM: 136 mmol/L (ref 135–145)
TOTAL PROTEIN: 7.2 g/dL (ref 6.5–8.1)

## 2017-03-02 LAB — CBC WITH DIFFERENTIAL/PLATELET
BASOS ABS: 0 10*3/uL (ref 0–0.1)
Basophils Relative: 1 %
Eosinophils Absolute: 0 10*3/uL (ref 0–0.7)
Eosinophils Relative: 1 %
HEMATOCRIT: 35.8 % (ref 35.0–47.0)
Hemoglobin: 12.5 g/dL (ref 12.0–16.0)
LYMPHS ABS: 0.8 10*3/uL — AB (ref 1.0–3.6)
LYMPHS PCT: 41 %
MCH: 33.6 pg (ref 26.0–34.0)
MCHC: 34.8 g/dL (ref 32.0–36.0)
MCV: 96.4 fL (ref 80.0–100.0)
MONO ABS: 0.2 10*3/uL (ref 0.2–0.9)
Monocytes Relative: 11 %
NEUTROS ABS: 0.9 10*3/uL — AB (ref 1.4–6.5)
Neutrophils Relative %: 46 %
Platelets: 176 10*3/uL (ref 150–440)
RBC: 3.71 MIL/uL — ABNORMAL LOW (ref 3.80–5.20)
RDW: 18.7 % — AB (ref 11.5–14.5)
WBC: 1.9 10*3/uL — ABNORMAL LOW (ref 3.6–11.0)

## 2017-03-02 MED ORDER — SODIUM CHLORIDE 0.9% FLUSH
10.0000 mL | INTRAVENOUS | Status: DC | PRN
  Administered 2017-03-02: 10 mL via INTRAVENOUS
  Filled 2017-03-02: qty 10

## 2017-03-02 MED ORDER — SODIUM CHLORIDE 0.9 % IV SOLN
Freq: Once | INTRAVENOUS | Status: AC
Start: 2017-03-02 — End: 2017-03-02
  Administered 2017-03-02: 14:00:00 via INTRAVENOUS
  Filled 2017-03-02: qty 1000

## 2017-03-02 MED ORDER — ZOLEDRONIC ACID 4 MG/100ML IV SOLN
4.0000 mg | Freq: Once | INTRAVENOUS | Status: AC
  Administered 2017-03-02: 4 mg via INTRAVENOUS
  Filled 2017-03-02: qty 100

## 2017-03-02 MED ORDER — PALBOCICLIB 75 MG PO CAPS: 75.0000 mg | ORAL_CAPSULE | Freq: Every day | ORAL | 3 refills | Status: DC

## 2017-03-02 MED ORDER — HEPARIN SOD (PORK) LOCK FLUSH 100 UNIT/ML IV SOLN
500.0000 [IU] | Freq: Once | INTRAVENOUS | Status: AC
  Administered 2017-03-02: 500 [IU] via INTRAVENOUS
  Filled 2017-03-02: qty 5

## 2017-03-02 NOTE — Telephone Encounter (Signed)
Oral Chemotherapy Pharmacist Encounter  Faxed dose reduction Ibrance 75mg  prescription to [employer].  Darl Pikes, PharmD, BCPS Hematology/Oncology Clinical Pharmacist [facility] Oral [facility] [phone]  03/02/2017 2:22 PM

## 2017-03-03 LAB — CANCER ANTIGEN 27.29: CAN 27.29: 178.8 U/mL — AB (ref 0.0–38.6)

## 2017-03-03 NOTE — Telephone Encounter (Signed)
Oral Oncology Patient [place] to see when patients medication Ibrance 75 mg would be shipped out, Per Hinton Dyer B. She will have it by Friday 03/05/2017. Left message for patient on voice mail.   [place] Patient Advocate [phone] 03/03/2017 1:58 PM

## 2017-03-10 ENCOUNTER — Inpatient Hospital Stay: Payer: Medicare Other

## 2017-03-10 DIAGNOSIS — C50912 Malignant neoplasm of unspecified site of left female breast: Secondary | ICD-10-CM | POA: Diagnosis not present

## 2017-03-10 DIAGNOSIS — Z170 Estrogen receptor positive status [ER+]: Principal | ICD-10-CM

## 2017-03-10 LAB — CBC WITH DIFFERENTIAL/PLATELET
Basophils Absolute: 0.1 10*3/uL (ref 0–0.1)
Basophils Relative: 3 %
EOS PCT: 1 %
Eosinophils Absolute: 0 10*3/uL (ref 0–0.7)
HCT: 38.3 % (ref 35.0–47.0)
HEMOGLOBIN: 13.3 g/dL (ref 12.0–16.0)
LYMPHS ABS: 1 10*3/uL (ref 1.0–3.6)
LYMPHS PCT: 31 %
MCH: 33.1 pg (ref 26.0–34.0)
MCHC: 34.6 g/dL (ref 32.0–36.0)
MCV: 95.7 fL (ref 80.0–100.0)
MONOS PCT: 9 %
Monocytes Absolute: 0.3 10*3/uL (ref 0.2–0.9)
Neutro Abs: 1.8 10*3/uL (ref 1.4–6.5)
Neutrophils Relative %: 56 %
PLATELETS: 324 10*3/uL (ref 150–440)
RBC: 4 MIL/uL (ref 3.80–5.20)
RDW: 18.3 % — AB (ref 11.5–14.5)
WBC: 3.2 10*3/uL — AB (ref 3.6–11.0)

## 2017-03-11 ENCOUNTER — Other Ambulatory Visit: Payer: Self-pay | Admitting: *Deleted

## 2017-03-11 ENCOUNTER — Telehealth: Payer: Self-pay | Admitting: Pharmacist

## 2017-03-11 NOTE — Progress Notes (Signed)
I called pt and she has not got the new dose.  You ordered the new dose11/23 but it has not came to her. alyson will call and check on status today. Patient was instructed to start it as soon as she gets new date and then call me to let me know what date she is starting

## 2017-03-11 NOTE — Telephone Encounter (Signed)
Pt called me also to let me know that she got med. Today and started it today.

## 2017-03-12 ENCOUNTER — Telehealth: Payer: Self-pay | Admitting: *Deleted

## 2017-03-12 NOTE — Telephone Encounter (Signed)
Had spoke to pt about her appts.  She did not have md visit.  I checked with dr. Janese Banks and she said that she wanted to see her on 12/24, I sent message to robin to add on md visit for that same day

## 2017-03-18 ENCOUNTER — Inpatient Hospital Stay: Payer: Medicare Other | Attending: Oncology

## 2017-03-18 DIAGNOSIS — C50912 Malignant neoplasm of unspecified site of left female breast: Secondary | ICD-10-CM | POA: Diagnosis present

## 2017-03-18 DIAGNOSIS — C7951 Secondary malignant neoplasm of bone: Secondary | ICD-10-CM | POA: Insufficient documentation

## 2017-03-18 DIAGNOSIS — T451X5S Adverse effect of antineoplastic and immunosuppressive drugs, sequela: Secondary | ICD-10-CM | POA: Insufficient documentation

## 2017-03-18 DIAGNOSIS — K760 Fatty (change of) liver, not elsewhere classified: Secondary | ICD-10-CM | POA: Insufficient documentation

## 2017-03-18 DIAGNOSIS — D701 Agranulocytosis secondary to cancer chemotherapy: Secondary | ICD-10-CM | POA: Diagnosis not present

## 2017-03-18 DIAGNOSIS — Z79811 Long term (current) use of aromatase inhibitors: Secondary | ICD-10-CM | POA: Diagnosis not present

## 2017-03-18 DIAGNOSIS — D171 Benign lipomatous neoplasm of skin and subcutaneous tissue of trunk: Secondary | ICD-10-CM | POA: Insufficient documentation

## 2017-03-18 DIAGNOSIS — I10 Essential (primary) hypertension: Secondary | ICD-10-CM | POA: Diagnosis not present

## 2017-03-18 DIAGNOSIS — D696 Thrombocytopenia, unspecified: Secondary | ICD-10-CM | POA: Diagnosis not present

## 2017-03-18 DIAGNOSIS — Z90710 Acquired absence of both cervix and uterus: Secondary | ICD-10-CM | POA: Insufficient documentation

## 2017-03-18 DIAGNOSIS — Z170 Estrogen receptor positive status [ER+]: Secondary | ICD-10-CM | POA: Diagnosis not present

## 2017-03-18 DIAGNOSIS — D702 Other drug-induced agranulocytosis: Secondary | ICD-10-CM

## 2017-03-18 LAB — CBC WITH DIFFERENTIAL/PLATELET
BASOS PCT: 2 %
Basophils Absolute: 0.1 10*3/uL (ref 0–0.1)
EOS ABS: 0.1 10*3/uL (ref 0–0.7)
Eosinophils Relative: 1 %
HCT: 40.6 % (ref 35.0–47.0)
HEMOGLOBIN: 14 g/dL (ref 12.0–16.0)
Lymphocytes Relative: 20 %
Lymphs Abs: 0.8 10*3/uL — ABNORMAL LOW (ref 1.0–3.6)
MCH: 33.2 pg (ref 26.0–34.0)
MCHC: 34.4 g/dL (ref 32.0–36.0)
MCV: 96.5 fL (ref 80.0–100.0)
MONOS PCT: 3 %
Monocytes Absolute: 0.1 10*3/uL — ABNORMAL LOW (ref 0.2–0.9)
NEUTROS PCT: 74 %
Neutro Abs: 3 10*3/uL (ref 1.4–6.5)
PLATELETS: 335 10*3/uL (ref 150–440)
RBC: 4.21 MIL/uL (ref 3.80–5.20)
RDW: 17.5 % — ABNORMAL HIGH (ref 11.5–14.5)
WBC: 4 10*3/uL (ref 3.6–11.0)

## 2017-03-26 ENCOUNTER — Telehealth: Payer: Self-pay | Admitting: *Deleted

## 2017-03-26 DIAGNOSIS — C50912 Malignant neoplasm of unspecified site of left female breast: Secondary | ICD-10-CM

## 2017-03-26 DIAGNOSIS — C7951 Secondary malignant neoplasm of bone: Secondary | ICD-10-CM

## 2017-03-26 MED ORDER — PROCHLORPERAZINE MALEATE 10 MG PO TABS: 10.0000 mg | ORAL_TABLET | Freq: Four times a day (QID) | ORAL | 1 refills | Status: DC | PRN

## 2017-03-26 NOTE — Telephone Encounter (Signed)
Called back pt and let her know the refill has been sent to pharmacy.

## 2017-04-05 ENCOUNTER — Inpatient Hospital Stay: Payer: Medicare Other

## 2017-04-05 ENCOUNTER — Other Ambulatory Visit: Payer: Self-pay | Admitting: *Deleted

## 2017-04-05 ENCOUNTER — Inpatient Hospital Stay (HOSPITAL_BASED_OUTPATIENT_CLINIC_OR_DEPARTMENT_OTHER): Payer: Medicare Other | Admitting: Oncology

## 2017-04-05 ENCOUNTER — Encounter: Payer: Self-pay | Admitting: Oncology

## 2017-04-05 VITALS — BP 159/73 | HR 67 | Temp 98.0°F | Resp 18 | Ht 62.0 in | Wt 195.3 lb

## 2017-04-05 DIAGNOSIS — D702 Other drug-induced agranulocytosis: Secondary | ICD-10-CM

## 2017-04-05 DIAGNOSIS — Z79811 Long term (current) use of aromatase inhibitors: Secondary | ICD-10-CM | POA: Diagnosis not present

## 2017-04-05 DIAGNOSIS — Z170 Estrogen receptor positive status [ER+]: Principal | ICD-10-CM

## 2017-04-05 DIAGNOSIS — C7951 Secondary malignant neoplasm of bone: Secondary | ICD-10-CM

## 2017-04-05 DIAGNOSIS — C50812 Malignant neoplasm of overlapping sites of left female breast: Secondary | ICD-10-CM

## 2017-04-05 DIAGNOSIS — D701 Agranulocytosis secondary to cancer chemotherapy: Secondary | ICD-10-CM

## 2017-04-05 DIAGNOSIS — C50912 Malignant neoplasm of unspecified site of left female breast: Secondary | ICD-10-CM

## 2017-04-05 DIAGNOSIS — D696 Thrombocytopenia, unspecified: Secondary | ICD-10-CM

## 2017-04-05 DIAGNOSIS — T451X5S Adverse effect of antineoplastic and immunosuppressive drugs, sequela: Secondary | ICD-10-CM | POA: Diagnosis not present

## 2017-04-05 LAB — CBC WITH DIFFERENTIAL/PLATELET
BASOS ABS: 0 10*3/uL (ref 0–0.1)
BASOS PCT: 2 %
Eosinophils Absolute: 0 10*3/uL (ref 0–0.7)
Eosinophils Relative: 1 %
HEMATOCRIT: 36.7 % (ref 35.0–47.0)
HEMOGLOBIN: 12.9 g/dL (ref 12.0–16.0)
LYMPHS PCT: 35 %
Lymphs Abs: 0.8 10*3/uL — ABNORMAL LOW (ref 1.0–3.6)
MCH: 33.3 pg (ref 26.0–34.0)
MCHC: 35.1 g/dL (ref 32.0–36.0)
MCV: 94.9 fL (ref 80.0–100.0)
MONO ABS: 0.2 10*3/uL (ref 0.2–0.9)
Monocytes Relative: 9 %
NEUTROS ABS: 1.2 10*3/uL — AB (ref 1.4–6.5)
NEUTROS PCT: 53 %
Platelets: 206 10*3/uL (ref 150–440)
RBC: 3.86 MIL/uL (ref 3.80–5.20)
RDW: 18.4 % — AB (ref 11.5–14.5)
WBC: 2.3 10*3/uL — ABNORMAL LOW (ref 3.6–11.0)

## 2017-04-05 LAB — COMPREHENSIVE METABOLIC PANEL
ALBUMIN: 4.4 g/dL (ref 3.5–5.0)
ALT: 23 U/L (ref 14–54)
ANION GAP: 10 (ref 5–15)
AST: 27 U/L (ref 15–41)
Alkaline Phosphatase: 94 U/L (ref 38–126)
BUN: 17 mg/dL (ref 6–20)
CO2: 26 mmol/L (ref 22–32)
Calcium: 9.7 mg/dL (ref 8.9–10.3)
Chloride: 102 mmol/L (ref 101–111)
Creatinine, Ser: 0.77 mg/dL (ref 0.44–1.00)
GFR calc non Af Amer: >60 mL/min (ref >60)
GLUCOSE: 123 mg/dL — AB (ref 65–99)
POTASSIUM: 3.5 mmol/L (ref 3.5–5.1)
SODIUM: 138 mmol/L (ref 135–145)
Total Bilirubin: 0.9 mg/dL (ref 0.3–1.2)
Total Protein: 7 g/dL (ref 6.5–8.1)

## 2017-04-05 MED ORDER — HEPARIN SOD (PORK) LOCK FLUSH 100 UNIT/ML IV SOLN
500.0000 [IU] | Freq: Once | INTRAVENOUS | Status: AC | PRN
  Administered 2017-04-05: 500 [IU]
  Filled 2017-04-05: qty 5

## 2017-04-05 MED ORDER — SODIUM CHLORIDE 0.9 % IV SOLN
Freq: Once | INTRAVENOUS | Status: AC
Start: 2017-04-05 — End: 2017-04-05
  Administered 2017-04-05: 09:00:00 via INTRAVENOUS
  Filled 2017-04-05: qty 1000

## 2017-04-05 MED ORDER — ZOLEDRONIC ACID 4 MG/100ML IV SOLN
4.0000 mg | Freq: Once | INTRAVENOUS | Status: AC
  Administered 2017-04-05: 4 mg via INTRAVENOUS
  Filled 2017-04-05: qty 100

## 2017-04-05 MED ORDER — SODIUM CHLORIDE 0.9% FLUSH
10.0000 mL | INTRAVENOUS | Status: DC | PRN
  Administered 2017-04-05: 10 mL
  Filled 2017-04-05: qty 10

## 2017-04-05 NOTE — Progress Notes (Signed)
Waiting on shipment of ibrance to start after christmas.

## 2017-04-05 NOTE — Progress Notes (Signed)
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 Patient is a 76 year old female with no significant comorbidities who noticed left breast mass about 6-9 months ago. She thought it would go awaybut it continued to increase in size and began to involve her skin. She also noted tenderness to palpation as well as intermittent sharp tingling pain. She was seen by Dr. Adora Fridge on 08/25/2016 and patient had a biopsy of her skin in his office which showed:DIAGNOSIS:  A. LEFT BREAST SKIN; EXCISION:  - INVASIVE CARCINOMA MORPHOLOGICALLY CONSISTENT WITH MAMMARY ORIGIN  INVOLVING THE DERMIS.   BREAST BIOMARKER TESTS  Estrogen Receptor (ER) Status: POSITIVE, >90%  Progesterone Receptor (PgR) Status: POSITIVE, >70%  Her2 negative  2. Patient underwent bilateral diagnostic mammogram on 08/31/2016 showed:IMPRESSION: 1. Known left breast cancer, retroareolar with probable extension to the nipple, measuring 4.3 cm greatest dimension by ultrasound, with associated architectural distortion and associated left nipple retraction, and with associated diffuse skin thickening on the left. 2. No enlarged or morphologically abnormal lymph nodes are seen in the left axilla by ultrasound. 3. No evidence of malignancy within the right breast.  3. Patient was also complaining of some perirectal pain and underwent CT abdomen pelvis with contrast on 09/02/2016 which showed: IMPRESSION: Left colonic diverticulosis. No active diverticulitis.Fatty liver. No acute findings or evidence of metastatic  disease in the abdomen or pelvis. Diffuse bony sclerotic metastases.  4. Other than that her breast pain patient overall feels well. Denies any unintentional weight loss. She continues to be active and care for her great-grandchildren. She has had a hysterectomy in the past. No family history of breast or ovarian cancer.  5. Given that she had inflammatory breast cancer- plan was to give 4 cycles of dose dense AC followed by possible mastectomy and treat bone mets with AI + ibrance  6. PET CT scan on 09/11/16 showed: IMPRESSION: 1. Low-grade but abnormal hypermetabolic activity associated with the cutaneous and sub areolar glandular tissues of the left breast, compatible with malignancy, representative SUV 3.9 compared to contralateral normal side 1.4. 2. Diffuse sclerotic osseous metastatic disease with low-grade hypermetabolic activity, a representative SUV along the left sacrum 5.4. 3. Other imaging findings of potential clinical significance: Aortic Atherosclerosis (ICD10-I70.0). Descending and sigmoid colon Diverticulosis.  7. Baseline MUGA scan showed EF of 71%. Baseline CA 27.29 elevated at 563.2  8. 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  9. Letrozole and ibrance started in sept 2018. zometa monthly also started. Baseline bone density scan normal  10. ibrance held in oct 2018 after cycle 1 due to Grade 3 neutropenia and thrombocytopenia.cycle 2 restaretd on 02/04/17 at 100 mg. Patient continued to have prolongeed grade 3 neutropenia and dose lowered to 75 mg  Interval history-patient is currently tolerating her Ibrance well.  She is awaiting her next shipment of Ibrance.  She does report minor fatigue.  Has joint aches from letrozole.  ECOG PS- 0 Pain scale- 0   Review of systems- Review of Systems  Constitutional: Negative for chills, fever, malaise/fatigue and weight loss.  HENT: Negative for congestion, ear discharge and nosebleeds.  Eyes:  Negative for blurred vision.  Respiratory: Negative for cough, hemoptysis, sputum production, shortness of breath and wheezing.   Cardiovascular: Negative for chest pain, palpitations, orthopnea and claudication.  Gastrointestinal: Negative for abdominal pain, blood in stool, constipation, diarrhea, heartburn, melena, nausea and vomiting.  Genitourinary: Negative for dysuria, flank pain, frequency, hematuria and urgency.  Musculoskeletal: Negative for back pain, joint pain and myalgias.  Skin: Negative for rash.  Neurological: Negative for dizziness, tingling, focal weakness, seizures, weakness and headaches.  Endo/Heme/Allergies: Does not bruise/bleed easily.  Psychiatric/Behavioral: Negative for depression and suicidal ideas. The patient does not have insomnia.       No Known Allergies   Past Medical History:  Diagnosis Date  . Breast cancer ([place])   . Cancer ([place])    left breast  . Hypertension   . Port catheter in place 09/14/2016   Placed 09/10/2016 RT chest.     Past Surgical History:  Procedure Laterality Date  . ABDOMINAL HYSTERECTOMY    . APPENDECTOMY    . BREAST BIOPSY    . CYST EXCISION Right 09/2016   Buttocks  . PORTACATH PLACEMENT Right 09/10/2016   Procedure: INSERTION PORT-A-CATH;  Surgeon: Jules Husbands, MD;  Location: [facility] ORS;  Service: General;  Laterality: Right;    Social History   Socioeconomic History  . Marital status: Married    Spouse name: Jeneen Rinks  . Number of children: 2  . Years of education: 6  . Highest education level: Bachelor's degree (e.g., BA, AB, BS)  Social Needs  . Financial resource strain: 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  . Food insecurity - worry: Never true  . Food insecurity - inability: Never true  . Transportation needs - medical: No  . Transportation needs - non-medical: No  Occupational History  . 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 Smoking status: Never Smoker  . Smokeless tobacco:  Never Used  Substance and Sexual Activity  . Alcohol use: No  . Drug use: No  . Sexual activity: No  Other Topics Concern  . Not on file  Social History Narrative  . Not on file    Family History  Problem Relation Age of Onset  . Parkinson's disease Mother   . Bladder Cancer Father   . Lung cancer Father      Current Outpatient Medications:  .  acetaminophen (TYLENOL) 500 MG tablet, Take 1,000 mg by mouth every 6 (six) hours as needed for moderate pain., Disp: , Rfl:  .  amLODipine (NORVASC) 10 MG tablet, Take 10 mg by mouth daily., Disp: , Rfl:  .  Calcium Carbonate (CALCIUM-CARB 600 PO), Take 1 capsule by mouth 2 (two) times daily., Disp: , Rfl:  .  diphenoxylate-atropine (LOMOTIL) 2.5-0.025 MG tablet, Take 1 tablet by mouth 4 (four) times daily as needed for diarrhea or loose stools., Disp: 30 tablet, Rfl: 0 .  hydrochlorothiazide (MICROZIDE) 12.5 MG capsule, 12.5 mg daily as needed. , Disp: , Rfl:  .  letrozole (FEMARA) 2.5 MG tablet, Take 1 tablet (2.5 mg total) by mouth daily., Disp: 90 tablet, Rfl: 3 .  lidocaine-prilocaine (EMLA) cream, Apply to affected area once, Disp: 30 g, Rfl: 3 .  LORazepam (ATIVAN) 0.5 MG tablet, Take 1 tablet (0.5 mg total) by mouth every 6 (six) hours as needed (Nausea or vomiting). (Patient taking differently: Take 0.5 mg by mouth every 6 (six) hours as needed. Nausea or vomiting), Disp: 30 tablet, Rfl: 0 .  magic mouthwash SOLN, Take 5 mLs by mouth 4 (four) times daily as needed for mouth pain., Disp: 240 mL, Rfl: 0 .  ondansetron (ZOFRAN) 8 MG tablet, Take 1 tablet (8 mg total) by mouth 2 (two) times daily as needed. Start on the third day after chemotherapy. (Patient taking differently: Take 8 mg by mouth 2 (two) times daily as needed for nausea. Start on the third day after chemotherapy.), Disp: 30 tablet, Rfl: 1 .  palbociclib (IBRANCE) 75 MG capsule, Take 1 capsule (75 mg total) by mouth daily with breakfast. Take whole with food., Disp: 30  capsule, Rfl: 3 .  prochlorperazine (COMPAZINE) 10 MG tablet, Take 1 tablet (10 mg total) by mouth every 6 (six) hours as needed (Nausea or vomiting)., Disp: 30 tablet, Rfl: 1 .  Vitamin D, Cholecalciferol, 400 units CAPS, Take 1 capsule by mouth 2 (two) times daily., Disp: , Rfl:  .  amLODipine (NORVASC) 5 MG tablet, Take 1 tablet (5 mg total) by mouth daily. (Patient taking differently: Take 10 mg daily by mouth. ), Disp: 30 tablet, Rfl: 0 .  dexamethasone (DECADRON) 4 MG tablet, Take 2 tablets by mouth once a day on the day after chemotherapy and then take 2 tablets two times a day for 2 days. Take with food., Disp: 30 tablet, Rfl: 1 .  PREVIDENT 5000 BOOSTER PLUS 1.1 % PSTE, , Disp: , Rfl:  No current facility-administered medications for this visit.   Facility-Administered Medications Ordered in Other Visits:  .  0.9 %  sodium chloride infusion, , Intravenous, Once, Sindy Guadeloupe, MD .  Zoledronic Acid (ZOMETA) IVPB 4 mg, 4 mg, Intravenous, Once, Sindy Guadeloupe, MD  Physical exam:  Vitals:   04/05/17 0841  BP: (!) 159/73  Pulse: 67  Resp: 18  Temp: 98 F (36.7 C)  TempSrc: Tympanic  Weight: 195 lb 5.2 oz (88.6 kg)  Height: _0  (1.575 m)   Physical Exam  Constitutional: She is oriented to person, place, and time and well-developed, well-nourished, and in no distress.  HENT:  Head: Normocephalic and atraumatic.  Mouth/Throat: Oropharynx is clear and moist.  Eyes: EOM are normal. Pupils are equal, round, and reactive to light.  Neck: Normal range of motion.  Cardiovascular: Normal rate, regular rhythm and normal heart sounds.  Pulmonary/Chest: Effort normal and breath sounds normal.  Abdominal: Soft. Bowel sounds are normal.  Neurological: She is alert and oriented to person, place, and time.  Skin: Skin is warm and dry.  Left breast mass is still palpable but overall decreased in size over the last 4 months gradually  CMP Latest Ref Rng & Units 04/05/2017  Glucose 65 - 99  mg/dL 123(H)  BUN 6 - 20 mg/dL 17  Creatinine 0.44 - 1.00 mg/dL 0.77  Sodium 135 - 145 mmol/L 138  Potassium 3.5 - 5.1 mmol/L 3.5  Chloride 101 - 111 mmol/L 102  CO2 22 - 32 mmol/L 26  Calcium 8.9 - 10.3 mg/dL 9.7  Total Protein 6.5 - 8.1 g/dL 7.0  Total Bilirubin 0.3 - 1.2 mg/dL 0.9  Alkaline Phos 38 - 126 U/L 94  AST 15 - 41 U/L 27  ALT 14 - 54 U/L 23   CBC Latest Ref Rng & Units 04/05/2017  WBC 3.6 - 11.0 K/uL 2.3(L)  Hemoglobin 12.0 - 16.0 g/dL 12.9  Hematocrit 35.0 - 47.0 % 36.7  Platelets 150 - 440 K/uL 206      Assessment and plan- Patient is a 76 y.o. female  with Stage IV ER PR positive her 2 negative breast cancer with bone only mets  Patient currently has grade 1 neutropenia today as her ANC is 1.2.  She will continue letrozole at 75 mg when her shipment arrives.  We will repeat a CBC with differential on day 15 of cycle 4 of treatment.  She will continue letrozole as well as calcium and vitamin D.  Zometa today.  CA-27-29 continues to improve and levels are pending from today.  We will obtain CT chest abdomen and pelvis as well as a bone scan and I will see her back in 4 weeks time prior to cycle #5 of Ibrance   Visit Diagnosis 1. Bone metastases ([place])   2. Malignant neoplasm of left breast in female, estrogen receptor positive, unspecified site of breast ([place])   3. Drug-induced neutropenia ([place])      Dr. Randa Evens, MD, MPH [facility] at [facility] Pager- [phone] 04/05/2017 9:02 AM

## 2017-04-06 LAB — CANCER ANTIGEN 27.29: CAN 27.29: 151.3 U/mL — AB (ref 0.0–38.6)

## 2017-04-09 ENCOUNTER — Telehealth: Payer: Self-pay | Admitting: Oncology

## 2017-04-09 NOTE — Telephone Encounter (Signed)
Oral Oncology Patient Advocate Copywriter, advertising to set up patients next shipment of Ibrance. Will ship out on today and be delivered Saturday 04/10/2017.  Called patient to let her know she would get her [place] tomorrow. Told patient to call them every month when she gets close to being out. [phone].    [place] Patient Advocate [phone] 04/09/2017 9:21 AM

## 2017-04-16 ENCOUNTER — Telehealth: Payer: Self-pay | Admitting: *Deleted

## 2017-04-16 NOTE — Telephone Encounter (Signed)
Pt called to say that she got her revlimid and started it on 04/10/2017.

## 2017-04-19 ENCOUNTER — Telehealth: Payer: Self-pay | Admitting: Oncology

## 2017-04-19 NOTE — Telephone Encounter (Signed)
Oral Oncology Patient Advocate Encounter  Was successful in securing patient an $ 6000.00 grant from [place] to provide copayment coverage for her Ibrance.  This will keep the out of pocket expense at $0.    I have spoken with the patient.    The billing information is as follows and has been shared with [place].   Member ID: [id number] Group ID: [id number] RxBin: [id number] Dates of Eligibility: 04/14/2017 through 04/14/2018    Oral Oncology Patient Advocate Encounter  Was successful in securing patient an $ 15,000.00 grant from [place] to provide copayment coverage for her Ibrance. This will keep the out of pocket expense at $0.    I have spoken with the patient.    The billing information is as follows and has been shared with [place].   Member ID: [id number] Group ID: [id number] RxBin: [id number] Dates of Eligibility: 03/16/2017 through 03/15/2018   [place] Patient Advocate [phone] 04/19/2017 10:11 AM

## 2017-04-20 ENCOUNTER — Other Ambulatory Visit: Payer: Self-pay | Admitting: *Deleted

## 2017-04-20 DIAGNOSIS — C50912 Malignant neoplasm of unspecified site of left female breast: Secondary | ICD-10-CM

## 2017-04-20 DIAGNOSIS — C7951 Secondary malignant neoplasm of bone: Secondary | ICD-10-CM

## 2017-04-20 DIAGNOSIS — Z170 Estrogen receptor positive status [ER+]: Principal | ICD-10-CM

## 2017-04-21 ENCOUNTER — Other Ambulatory Visit: Payer: Self-pay | Admitting: *Deleted

## 2017-04-21 DIAGNOSIS — C7951 Secondary malignant neoplasm of bone: Secondary | ICD-10-CM

## 2017-04-21 DIAGNOSIS — C50912 Malignant neoplasm of unspecified site of left female breast: Secondary | ICD-10-CM

## 2017-04-21 DIAGNOSIS — Z170 Estrogen receptor positive status [ER+]: Secondary | ICD-10-CM

## 2017-04-26 ENCOUNTER — Inpatient Hospital Stay: Payer: Medicare Other | Attending: Oncology

## 2017-04-26 DIAGNOSIS — I10 Essential (primary) hypertension: Secondary | ICD-10-CM | POA: Diagnosis not present

## 2017-04-26 DIAGNOSIS — C50912 Malignant neoplasm of unspecified site of left female breast: Secondary | ICD-10-CM

## 2017-04-26 DIAGNOSIS — K760 Fatty (change of) liver, not elsewhere classified: Secondary | ICD-10-CM | POA: Diagnosis not present

## 2017-04-26 DIAGNOSIS — D171 Benign lipomatous neoplasm of skin and subcutaneous tissue of trunk: Secondary | ICD-10-CM | POA: Insufficient documentation

## 2017-04-26 DIAGNOSIS — Z170 Estrogen receptor positive status [ER+]: Secondary | ICD-10-CM | POA: Diagnosis not present

## 2017-04-26 DIAGNOSIS — C7951 Secondary malignant neoplasm of bone: Secondary | ICD-10-CM | POA: Insufficient documentation

## 2017-04-26 DIAGNOSIS — Z79811 Long term (current) use of aromatase inhibitors: Secondary | ICD-10-CM | POA: Diagnosis not present

## 2017-04-26 DIAGNOSIS — Z79899 Other long term (current) drug therapy: Secondary | ICD-10-CM | POA: Diagnosis not present

## 2017-04-26 DIAGNOSIS — Z90710 Acquired absence of both cervix and uterus: Secondary | ICD-10-CM | POA: Insufficient documentation

## 2017-04-26 LAB — CBC WITH DIFFERENTIAL/PLATELET
Basophils Absolute: 0.1 10*3/uL (ref 0–0.1)
Basophils Relative: 3 %
EOS ABS: 0 10*3/uL (ref 0–0.7)
EOS PCT: 1 %
HCT: 38.5 % (ref 35.0–47.0)
Hemoglobin: 13.5 g/dL (ref 12.0–16.0)
LYMPHS PCT: 37 %
Lymphs Abs: 0.9 10*3/uL — ABNORMAL LOW (ref 1.0–3.6)
MCH: 34 pg (ref 26.0–34.0)
MCHC: 35.2 g/dL (ref 32.0–36.0)
MCV: 96.6 fL (ref 80.0–100.0)
Monocytes Absolute: 0.1 10*3/uL — ABNORMAL LOW (ref 0.2–0.9)
Monocytes Relative: 6 %
Neutro Abs: 1.3 10*3/uL — ABNORMAL LOW (ref 1.4–6.5)
Neutrophils Relative %: 53 %
PLATELETS: 237 10*3/uL (ref 150–440)
RBC: 3.98 MIL/uL (ref 3.80–5.20)
RDW: 18.3 % — ABNORMAL HIGH (ref 11.5–14.5)
WBC: 2.5 10*3/uL — AB (ref 3.6–11.0)

## 2017-05-06 ENCOUNTER — Encounter: Payer: Self-pay | Admitting: Oncology

## 2017-05-06 ENCOUNTER — Inpatient Hospital Stay (HOSPITAL_BASED_OUTPATIENT_CLINIC_OR_DEPARTMENT_OTHER): Payer: Medicare Other | Admitting: Oncology

## 2017-05-06 ENCOUNTER — Other Ambulatory Visit: Payer: Self-pay | Admitting: Pharmacist

## 2017-05-06 ENCOUNTER — Inpatient Hospital Stay: Payer: Medicare Other

## 2017-05-06 VITALS — BP 159/77 | HR 74 | Temp 98.7°F | Resp 20 | Wt 196.0 lb

## 2017-05-06 DIAGNOSIS — Z170 Estrogen receptor positive status [ER+]: Principal | ICD-10-CM

## 2017-05-06 DIAGNOSIS — Z7983 Long term (current) use of bisphosphonates: Secondary | ICD-10-CM

## 2017-05-06 DIAGNOSIS — C50912 Malignant neoplasm of unspecified site of left female breast: Secondary | ICD-10-CM

## 2017-05-06 DIAGNOSIS — C7951 Secondary malignant neoplasm of bone: Secondary | ICD-10-CM | POA: Diagnosis not present

## 2017-05-06 DIAGNOSIS — D702 Other drug-induced agranulocytosis: Secondary | ICD-10-CM

## 2017-05-06 DIAGNOSIS — Z79899 Other long term (current) drug therapy: Secondary | ICD-10-CM

## 2017-05-06 DIAGNOSIS — Z79811 Long term (current) use of aromatase inhibitors: Secondary | ICD-10-CM | POA: Diagnosis not present

## 2017-05-06 LAB — CBC WITH DIFFERENTIAL/PLATELET
Basophils Absolute: 0 10*3/uL (ref 0–0.1)
Basophils Relative: 1 %
EOS ABS: 0 10*3/uL (ref 0–0.7)
EOS PCT: 1 %
HCT: 36.5 % (ref 35.0–47.0)
Hemoglobin: 12.9 g/dL (ref 12.0–16.0)
Lymphocytes Relative: 41 %
Lymphs Abs: 1.2 10*3/uL (ref 1.0–3.6)
MCH: 34.2 pg — ABNORMAL HIGH (ref 26.0–34.0)
MCHC: 35.2 g/dL (ref 32.0–36.0)
MCV: 97 fL (ref 80.0–100.0)
MONOS PCT: 11 %
Monocytes Absolute: 0.3 10*3/uL (ref 0.2–0.9)
Neutro Abs: 1.4 10*3/uL (ref 1.4–6.5)
Neutrophils Relative %: 46 %
PLATELETS: 234 10*3/uL (ref 150–440)
RBC: 3.76 MIL/uL — ABNORMAL LOW (ref 3.80–5.20)
RDW: 19.4 % — ABNORMAL HIGH (ref 11.5–14.5)
WBC: 3 10*3/uL — ABNORMAL LOW (ref 3.6–11.0)

## 2017-05-06 LAB — COMPREHENSIVE METABOLIC PANEL
ALT: 22 U/L (ref 14–54)
AST: 29 U/L (ref 15–41)
Albumin: 4.2 g/dL (ref 3.5–5.0)
Alkaline Phosphatase: 74 U/L (ref 38–126)
Anion gap: 10 (ref 5–15)
BUN: 12 mg/dL (ref 6–20)
CHLORIDE: 103 mmol/L (ref 101–111)
CO2: 26 mmol/L (ref 22–32)
CREATININE: 0.78 mg/dL (ref 0.44–1.00)
Calcium: 9.7 mg/dL (ref 8.9–10.3)
GFR calc Af Amer: >60 mL/min (ref >60)
GFR calc non Af Amer: >60 mL/min (ref >60)
GLUCOSE: 137 mg/dL — AB (ref 65–99)
Potassium: 3.7 mmol/L (ref 3.5–5.1)
SODIUM: 139 mmol/L (ref 135–145)
Total Bilirubin: 0.9 mg/dL (ref 0.3–1.2)
Total Protein: 6.9 g/dL (ref 6.5–8.1)

## 2017-05-06 MED ORDER — PALBOCICLIB 75 MG PO CAPS: 75.0000 mg | ORAL_CAPSULE | Freq: Every day | ORAL | 3 refills | Status: DC

## 2017-05-06 MED ORDER — HEPARIN SOD (PORK) LOCK FLUSH 100 UNIT/ML IV SOLN
500.0000 [IU] | Freq: Once | INTRAVENOUS | Status: AC | PRN
  Administered 2017-05-06: 500 [IU]

## 2017-05-06 MED ORDER — HEPARIN SOD (PORK) LOCK FLUSH 100 UNIT/ML IV SOLN
INTRAVENOUS | Status: AC
  Filled 2017-05-06: qty 5

## 2017-05-06 MED ORDER — SODIUM CHLORIDE 0.9 % IV SOLN
INTRAVENOUS | Status: DC
  Administered 2017-05-06: 15:00:00 via INTRAVENOUS
  Filled 2017-05-06: qty 1000

## 2017-05-06 MED ORDER — ZOLEDRONIC ACID 4 MG/100ML IV SOLN
4.0000 mg | Freq: Once | INTRAVENOUS | Status: AC
  Administered 2017-05-06: 4 mg via INTRAVENOUS
  Filled 2017-05-06: qty 100

## 2017-05-06 NOTE — Progress Notes (Signed)
Hematology/Oncology Consult note [facility]  Telephone:([phone] Fax:([phone]  Patient Care Team: Letta Median, MD as PCP - General (Family Medicine)   Name of the patient: Victoria Steele  [id number]  June 26, 1940   Date of visit: 05/06/17  Diagnosis- Stage IV invasive mammary carcinoma [id number] with bone only metastases  Chief complaint/ Reason for visit- f/u of breast cancer on letrozole plus ibrance  Heme/Onc history:1. Patient is a 77 year old female with no significant comorbidities who noticed left breast mass about 6-9 months ago. She thought it would go awaybut it continued to increase in size and began to involve her skin. She also noted tenderness to palpation as well as intermittent sharp tingling pain. She was seen by Dr. Adora Fridge on 08/25/2016 and patient had a biopsy of her skin in his office which showed:DIAGNOSIS:  A. LEFT BREAST SKIN; EXCISION:  - INVASIVE CARCINOMA MORPHOLOGICALLY CONSISTENT WITH MAMMARY ORIGIN  INVOLVING THE DERMIS.   BREAST BIOMARKER TESTS  Estrogen Receptor (ER) Status: POSITIVE, >90%  Progesterone Receptor (PgR) Status: POSITIVE, >70%  Her2 negative  2. Patient underwent bilateral diagnostic mammogram on 08/31/2016 showed:IMPRESSION: 1. Known left breast cancer, retroareolar with probable extension to the nipple, measuring 4.3 cm greatest dimension by ultrasound, with associated architectural distortion and associated left nipple retraction, and with associated diffuse skin thickening on the left. 2. No enlarged or morphologically abnormal lymph nodes are seen in the left axilla by ultrasound. 3. No evidence of malignancy within the right breast.  3. Patient was also complaining of some perirectal pain and underwent CT abdomen pelvis with contrast on 09/02/2016 which showed: IMPRESSION: Left colonic diverticulosis. No active diverticulitis.Fatty liver. No acute findings or evidence of metastatic  disease in the abdomen or pelvis. Diffuse bony sclerotic metastases.  4. Other than that her breast pain patient overall feels well. Denies any unintentional weight loss. She continues to be active and care for her great-grandchildren. She has had a hysterectomy in the past. No family history of breast or ovarian cancer.  5. Given that she had inflammatory breast cancer- plan was to give 4 cycles of dose dense AC followed by possible mastectomy and treat bone mets with AI + ibrance  6. PET CT scan on 09/11/16 showed: IMPRESSION: 1. Low-grade but abnormal hypermetabolic activity associated with the cutaneous and sub areolar glandular tissues of the left breast, compatible with malignancy, representative SUV 3.9 compared to contralateral normal side 1.4. 2. Diffuse sclerotic osseous metastatic disease with low-grade hypermetabolic activity, a representative SUV along the left sacrum 5.4. 3. Other imaging findings of potential clinical significance: Aortic Atherosclerosis (ICD10-I70.0). Descending and sigmoid colon Diverticulosis.  7. Baseline MUGA scan showed EF of 71%. Baseline CA 27.29 elevated at 563.2  8. 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  9. Letrozole and ibrance started in sept 2018. zometa monthly also started. Baseline bone density scan normal  10. ibrance held in oct 2018 after cycle 1 due to Grade 3 neutropenia and thrombocytopenia.cycle 2 restaretd on 02/04/17 at 100 mg. Patient continued to have prolongeed grade 3 neutropenia and dose lowered to 75 mg    Interval history- she is due to start next cycle of ibrance tomorrow. Tolerating it well so far. Denies any fatigue, fever, mouth sores or diarrhea  ECOG PS- 0 Pain scale- 0   Review of systems- Review of Systems  Constitutional: Negative for chills, fever, malaise/fatigue and weight loss.  HENT: Negative for congestion, ear discharge and nosebleeds.  Eyes: Negative for blurred vision.    Respiratory: Negative for cough, hemoptysis, sputum production, shortness of breath and wheezing.   Cardiovascular: Negative for chest pain, palpitations, orthopnea and claudication.  Gastrointestinal: Negative for abdominal pain, blood in stool, constipation, diarrhea, heartburn, melena, nausea and vomiting.  Genitourinary: Negative for dysuria, flank pain, frequency, hematuria and urgency.  Musculoskeletal: Negative for back pain, joint pain and myalgias.  Skin: Negative for rash.  Neurological: Negative for dizziness, tingling, focal weakness, seizures, weakness and headaches.  Endo/Heme/Allergies: Does not bruise/bleed easily.  Psychiatric/Behavioral: Negative for depression and suicidal ideas. The patient does not have insomnia.       No Known Allergies   Past Medical History:  Diagnosis Date  . Breast cancer ([place])   . Cancer ([place])    left breast  . Hypertension   . Port catheter in place 09/14/2016   Placed 09/10/2016 RT chest.     Past Surgical History:  Procedure Laterality Date  . ABDOMINAL HYSTERECTOMY    . APPENDECTOMY    . BREAST BIOPSY    . CYST EXCISION Right 09/2016   Buttocks  . PORTACATH PLACEMENT Right 09/10/2016   Procedure: INSERTION PORT-A-CATH;  Surgeon: Jules Husbands, MD;  Location: [facility] ORS;  Service: General;  Laterality: Right;    Social History   Socioeconomic History  . Marital status: Married    Spouse name: Jeneen Rinks  . Number of children: 2  . Years of education: 6  . Highest education level: Bachelor's degree (e.g., BA, AB, BS)  Social Needs  . Financial resource strain: 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  . Food insecurity - worry: Never true  . Food insecurity - inability: Never true  . Transportation needs - medical: No  . Transportation needs - non-medical: No  Occupational History  . 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 Smoking status: Never Smoker  . Smokeless tobacco: Never Used  Substance and  Sexual Activity  . Alcohol use: No  . Drug use: No  . Sexual activity: No  Other Topics Concern  . Not on file  Social History Narrative  . Not on file    Family History  Problem Relation Age of Onset  . Parkinson's disease Mother   . Bladder Cancer Father   . Lung cancer Father      Current Outpatient Medications:  .  acetaminophen (TYLENOL) 500 MG tablet, Take 1,000 mg by mouth every 6 (six) hours as needed for moderate pain., Disp: , Rfl:  .  amLODipine (NORVASC) 10 MG tablet, Take 10 mg by mouth daily., Disp: , Rfl:  .  amLODipine (NORVASC) 5 MG tablet, Take 1 tablet (5 mg total) by mouth daily. (Patient taking differently: Take 10 mg daily by mouth. ), Disp: 30 tablet, Rfl: 0 .  Calcium Carbonate (CALCIUM-CARB 600 PO), Take 1 capsule by mouth 2 (two) times daily., Disp: , Rfl:  .  dexamethasone (DECADRON) 4 MG tablet, Take 2 tablets by mouth once a day on the day after chemotherapy and then take 2 tablets two times a day for 2 days. Take with food., Disp: 30 tablet, Rfl: 1 .  diphenoxylate-atropine (LOMOTIL) 2.5-0.025 MG tablet, Take 1 tablet by mouth 4 (four) times daily as needed for diarrhea or loose stools., Disp: 30 tablet, Rfl: 0 .  hydrochlorothiazide (MICROZIDE) 12.5 MG capsule, 12.5 mg daily as needed. , Disp: , Rfl:  .  letrozole (FEMARA) 2.5 MG tablet, Take 1 tablet (2.5 mg  total) by mouth daily., Disp: 90 tablet, Rfl: 3 .  lidocaine-prilocaine (EMLA) cream, Apply to affected area once, Disp: 30 g, Rfl: 3 .  LORazepam (ATIVAN) 0.5 MG tablet, Take 1 tablet (0.5 mg total) by mouth every 6 (six) hours as needed (Nausea or vomiting). (Patient taking differently: Take 0.5 mg by mouth every 6 (six) hours as needed. Nausea or vomiting), Disp: 30 tablet, Rfl: 0 .  magic mouthwash SOLN, Take 5 mLs by mouth 4 (four) times daily as needed for mouth pain., Disp: 240 mL, Rfl: 0 .  ondansetron (ZOFRAN) 8 MG tablet, Take 1 tablet (8 mg total) by mouth 2 (two) times daily as needed.  Start on the third day after chemotherapy. (Patient taking differently: Take 8 mg by mouth 2 (two) times daily as needed for nausea. Start on the third day after chemotherapy.), Disp: 30 tablet, Rfl: 1 .  palbociclib (IBRANCE) 75 MG capsule, Take 1 capsule (75 mg total) by mouth daily with breakfast. Take whole with food., Disp: 30 capsule, Rfl: 3 .  PREVIDENT 5000 BOOSTER PLUS 1.1 % PSTE, , Disp: , Rfl:  .  prochlorperazine (COMPAZINE) 10 MG tablet, Take 1 tablet (10 mg total) by mouth every 6 (six) hours as needed (Nausea or vomiting)., Disp: 30 tablet, Rfl: 1 .  Vitamin D, Cholecalciferol, 400 units CAPS, Take 1 capsule by mouth 2 (two) times daily., Disp: , Rfl:   Physical exam:  Vitals:   05/06/17 1430  BP: (!) 159/77  Pulse: 74  Resp: 20  Temp: 98.7 F (37.1 C)  TempSrc: Tympanic  Weight: 196 lb (88.9 kg)   Physical Exam  Constitutional: She is oriented to person, place, and time and well-developed, well-nourished, and in no distress.  HENT:  Head: Normocephalic and atraumatic.  Eyes: EOM are normal. Pupils are equal, round, and reactive to light.  Neck: Normal range of motion.  Cardiovascular: Normal rate, regular rhythm and normal heart sounds.  Pulmonary/Chest: Effort normal and breath sounds normal.  Abdominal: Soft. Bowel sounds are normal.  Neurological: She is alert and oriented to person, place, and time.  Skin: Skin is warm and dry.   persistent left breast mass that is slowly shrinking in size. Associated skin thickening  CMP Latest Ref Rng & Units 05/06/2017  Glucose 65 - 99 mg/dL 137(H)  BUN 6 - 20 mg/dL 12  Creatinine 0.44 - 1.00 mg/dL 0.78  Sodium 135 - 145 mmol/L 139  Potassium 3.5 - 5.1 mmol/L 3.7  Chloride 101 - 111 mmol/L 103  CO2 22 - 32 mmol/L 26  Calcium 8.9 - 10.3 mg/dL 9.7  Total Protein 6.5 - 8.1 g/dL 6.9  Total Bilirubin 0.3 - 1.2 mg/dL 0.9  Alkaline Phos 38 - 126 U/L 74  AST 15 - 41 U/L 29  ALT 14 - 54 U/L 22   CBC Latest Ref Rng & Units  05/06/2017  WBC 3.6 - 11.0 K/uL 3.0(L)  Hemoglobin 12.0 - 16.0 g/dL 12.9  Hematocrit 35.0 - 47.0 % 36.5  Platelets 150 - 440 K/uL 234    Assessment and plan- Patient is a 77 y.o. female with Stage IV ER PR positive her 2 negative breast cancer with bone only mets  Wbc 3 today but anc >1.5. Ok to proceed with ibrance at 75 mg daily 3 week on 1 week off. I will see her back in 4 weeks with cbc, cmp and CA 27.29 and next dose of zometa  zometa today. CA 27.29 pending from today. Overall down  to 151 from baseline of 731. Ct chest abdomen pelvis and bone scan to be done asap to assess response    Visit Diagnosis 1. Bone metastases ([place])   2. Malignant neoplasm of left breast in female, estrogen receptor positive, unspecified site of breast ([place])   3. High risk medication use   4. Drug-induced neutropenia ([facility])   5. Long term current use of bisphosphonates      Dr. Randa Evens, MD, MPH [facility] at [facility] Pager- [phone] 05/06/2017 3:10 PM

## 2017-05-06 NOTE — Progress Notes (Signed)
Oral Chemotherapy Pharmacist Encounter   Foundation assistance opened up to cover the copay of Ms. Engelbrecht's Ibrance (palbociclib). A prescription for her [facility] will now be sent to [facility].   Darl Pikes, PharmD, BCPS Hematology/Oncology Clinical Pharmacist [facility] [phone]  05/06/2017 3:01 PM

## 2017-05-07 LAB — CANCER ANTIGEN 27.29: CA 27.29: 126.9 U/mL — ABNORMAL HIGH (ref 0.0–38.6)

## 2017-05-14 ENCOUNTER — Encounter
Admission: RE | Admit: 2017-05-14 | Discharge: 2017-05-14 | Disposition: A | Discharge status: Home / Self Care | Payer: Medicare Other | Source: Ambulatory Visit | Attending: Oncology | Admitting: Oncology

## 2017-05-14 ENCOUNTER — Ambulatory Visit
Admission: RE | Admit: 2017-05-14 | Discharge: 2017-05-14 | Disposition: A | Discharge status: Home / Self Care | Payer: Medicare Other | Source: Ambulatory Visit | Attending: Oncology | Admitting: Oncology

## 2017-05-14 DIAGNOSIS — C50912 Malignant neoplasm of unspecified site of left female breast: Secondary | ICD-10-CM | POA: Diagnosis not present

## 2017-05-14 DIAGNOSIS — C7951 Secondary malignant neoplasm of bone: Secondary | ICD-10-CM | POA: Insufficient documentation

## 2017-05-14 DIAGNOSIS — Z90710 Acquired absence of both cervix and uterus: Secondary | ICD-10-CM | POA: Insufficient documentation

## 2017-05-14 DIAGNOSIS — Z170 Estrogen receptor positive status [ER+]: Secondary | ICD-10-CM | POA: Diagnosis not present

## 2017-05-14 MED ORDER — TECHNETIUM TC 99M MEDRONATE IV KIT
25.0000 | PACK | Freq: Once | INTRAVENOUS | Status: AC | PRN
  Administered 2017-05-14: 23.12 via INTRAVENOUS

## 2017-05-14 MED ORDER — IOPAMIDOL (ISOVUE-300) INJECTION 61%
100.0000 mL | Freq: Once | INTRAVENOUS | Status: AC | PRN
  Administered 2017-05-14: 100 mL via INTRAVENOUS

## 2017-05-24 ENCOUNTER — Telehealth: Payer: Self-pay | Admitting: Oncology

## 2017-05-24 ENCOUNTER — Ambulatory Visit: Payer: Self-pay | Admitting: Oncology

## 2017-05-24 ENCOUNTER — Telehealth: Payer: Self-pay | Admitting: Pharmacist

## 2017-05-24 MED FILL — IBRANCE 75 MG CAPSULE: 75 | 28 days supply | Qty: 21 | Fill #0

## 2017-05-24 NOTE — Telephone Encounter (Signed)
Oral Oncology Patient Advocate Encounter  Sent e-mail to [facility] to please ship patients medication.    [place] Patient Advocate [phone] 05/24/2017 11:02 AM

## 2017-05-24 NOTE — Telephone Encounter (Signed)
Oral Chemotherapy Pharmacist Encounter  Patient Education I spoke with patient for overview of oral chemotherapy medication: Ibrance (palbociclib) for the treatment of metastatic breast cancer, planned duration until disease progression or unacceptable drug toxicity.   Pt is doing well. Counseled patient on administration, dosing, side effects, monitoring, drug-food interactions, safe handling, storage, and disposal. Patient will take 1 capsule (75 mg total) by mouth daily with breakfast. Take for 21 days on, then 7 days off.  Side effects include but not limited to: decrease ANC, fatigue, N/V, mouth sores irritation.    Reviewed with patient importance of keeping a medication schedule and plan for any missed doses.  Ms. Coatney voiced understanding and appreciation. All questions answered.  Provided patient with [facility] phone number. Patient knows to call the office with questions or concerns. Oral Chemotherapy Navigation Clinic will continue to follow.  Darl Pikes, PharmD, BCPS Hematology/Oncology Clinical Pharmacist [facility] [phone]  05/24/2017 9:34 AM

## 2017-05-24 NOTE — Telephone Encounter (Signed)
Oral Oncology Patient Advocate Encounter  Senr e-mail to [facility] to please mail patients medication.    [place] Patient Advocate [phone] 05/24/2017 9:40 AM

## 2017-05-24 NOTE — Progress Notes (Deleted)
Oral Oncology Patient Advocate Encounter  Sent e-mail to [facility] to please ship medication to her. She has grants for her co-pay.   [place] Patient Advocate [phone] 05/24/2017 10:07 AM

## 2017-06-03 ENCOUNTER — Inpatient Hospital Stay: Payer: Medicare Other | Attending: Oncology | Admitting: Oncology

## 2017-06-03 ENCOUNTER — Encounter: Payer: Self-pay | Admitting: Oncology

## 2017-06-03 ENCOUNTER — Inpatient Hospital Stay: Payer: Medicare Other

## 2017-06-03 VITALS — BP 156/84 | HR 88 | Temp 98.3°F | Resp 18 | Wt 198.0 lb

## 2017-06-03 DIAGNOSIS — Z7983 Long term (current) use of bisphosphonates: Secondary | ICD-10-CM

## 2017-06-03 DIAGNOSIS — C50912 Malignant neoplasm of unspecified site of left female breast: Secondary | ICD-10-CM | POA: Insufficient documentation

## 2017-06-03 DIAGNOSIS — C7951 Secondary malignant neoplasm of bone: Secondary | ICD-10-CM | POA: Insufficient documentation

## 2017-06-03 DIAGNOSIS — Z79811 Long term (current) use of aromatase inhibitors: Secondary | ICD-10-CM

## 2017-06-03 DIAGNOSIS — K760 Fatty (change of) liver, not elsewhere classified: Secondary | ICD-10-CM | POA: Diagnosis not present

## 2017-06-03 DIAGNOSIS — D72810 Lymphocytopenia: Secondary | ICD-10-CM | POA: Insufficient documentation

## 2017-06-03 DIAGNOSIS — Z79899 Other long term (current) drug therapy: Secondary | ICD-10-CM | POA: Insufficient documentation

## 2017-06-03 DIAGNOSIS — Z90710 Acquired absence of both cervix and uterus: Secondary | ICD-10-CM | POA: Diagnosis not present

## 2017-06-03 DIAGNOSIS — Z170 Estrogen receptor positive status [ER+]: Principal | ICD-10-CM

## 2017-06-03 DIAGNOSIS — I10 Essential (primary) hypertension: Secondary | ICD-10-CM | POA: Insufficient documentation

## 2017-06-03 DIAGNOSIS — Z95828 Presence of other vascular implants and grafts: Secondary | ICD-10-CM

## 2017-06-03 DIAGNOSIS — K5730 Diverticulosis of large intestine without perforation or abscess without bleeding: Secondary | ICD-10-CM | POA: Insufficient documentation

## 2017-06-03 DIAGNOSIS — D7589 Other specified diseases of blood and blood-forming organs: Secondary | ICD-10-CM

## 2017-06-03 LAB — COMPREHENSIVE METABOLIC PANEL
ALT: 28 U/L (ref 14–54)
AST: 32 U/L (ref 15–41)
Albumin: 4.3 g/dL (ref 3.5–5.0)
Alkaline Phosphatase: 68 U/L (ref 38–126)
Anion gap: 8 (ref 5–15)
BUN: 14 mg/dL (ref 6–20)
CO2: 27 mmol/L (ref 22–32)
Calcium: 9.8 mg/dL (ref 8.9–10.3)
Chloride: 102 mmol/L (ref 101–111)
Creatinine, Ser: 0.99 mg/dL (ref 0.44–1.00)
GFR calc Af Amer: >60 mL/min (ref >60)
GFR, EST NON AFRICAN AMERICAN: 55 mL/min — AB (ref >60)
GLUCOSE: 151 mg/dL — AB (ref 65–99)
POTASSIUM: 3.4 mmol/L — AB (ref 3.5–5.1)
Sodium: 137 mmol/L (ref 135–145)
Total Bilirubin: 1.2 mg/dL (ref 0.3–1.2)
Total Protein: 7.6 g/dL (ref 6.5–8.1)

## 2017-06-03 LAB — CBC WITH DIFFERENTIAL/PLATELET
Basophils Absolute: 0 10*3/uL (ref 0–0.1)
Basophils Relative: 1 %
Eosinophils Absolute: 0 10*3/uL (ref 0–0.7)
Eosinophils Relative: 1 %
HEMATOCRIT: 38 % (ref 35.0–47.0)
HEMOGLOBIN: 13.3 g/dL (ref 12.0–16.0)
LYMPHS ABS: 1.1 10*3/uL (ref 1.0–3.6)
LYMPHS PCT: 31 %
MCH: 35.4 pg — AB (ref 26.0–34.0)
MCHC: 35.1 g/dL (ref 32.0–36.0)
MCV: 100.9 fL — AB (ref 80.0–100.0)
MONO ABS: 0.3 10*3/uL (ref 0.2–0.9)
MONOS PCT: 7 %
NEUTROS ABS: 2.1 10*3/uL (ref 1.4–6.5)
NEUTROS PCT: 60 %
Platelets: 249 10*3/uL (ref 150–440)
RBC: 3.77 MIL/uL — ABNORMAL LOW (ref 3.80–5.20)
RDW: 19.1 % — AB (ref 11.5–14.5)
WBC: 3.4 10*3/uL — ABNORMAL LOW (ref 3.6–11.0)

## 2017-06-03 MED ORDER — ZOLEDRONIC ACID 4 MG/100ML IV SOLN
4.0000 mg | INTRAVENOUS | Status: DC
  Administered 2017-06-03: 4 mg via INTRAVENOUS
  Filled 2017-06-03: qty 100

## 2017-06-03 MED ORDER — SODIUM CHLORIDE 0.9 % IV SOLN
Freq: Once | INTRAVENOUS | Status: AC
  Administered 2017-06-03: 15:00:00 via INTRAVENOUS
  Filled 2017-06-03: qty 1000

## 2017-06-03 MED ORDER — HEPARIN SOD (PORK) LOCK FLUSH 100 UNIT/ML IV SOLN
500.0000 [IU] | Freq: Once | INTRAVENOUS | Status: AC
  Administered 2017-06-03: 500 [IU] via INTRAVENOUS
  Filled 2017-06-03: qty 5

## 2017-06-03 MED ORDER — SODIUM CHLORIDE 0.9% FLUSH
10.0000 mL | INTRAVENOUS | Status: DC | PRN
  Administered 2017-06-03 (×2): 10 mL via INTRAVENOUS
  Filled 2017-06-03: qty 10

## 2017-06-03 MED ORDER — HEPARIN SOD (PORK) LOCK FLUSH 100 UNIT/ML IV SOLN: 500.0000 [IU] | Freq: Once | INTRAVENOUS | Status: DC | PRN

## 2017-06-03 NOTE — Progress Notes (Signed)
Port does not yield blood return today. Patient states, "My port didn't give a blood return last time either." Port flushes without difficulty. No swelling, redness, or pain noted at port site. MD, Dr. Janese Banks, notified verbally in person. Per MD order: Port-a-cath can be used for treatment today. Proceed with scheduled Zometa treatment at this time.

## 2017-06-03 NOTE — Progress Notes (Signed)
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 Patient is a 77 year old female with no significant comorbidities who noticed left breast mass about 6-9 months ago. She thought it would go awaybut it continued to increase in size and began to involve her skin. She also noted tenderness to palpation as well as intermittent sharp tingling pain. She was seen by Dr. Adora Fridge on 08/25/2016 and patient had a biopsy of her skin in his office which showed:DIAGNOSIS:  A. LEFT BREAST SKIN; EXCISION:  - INVASIVE CARCINOMA MORPHOLOGICALLY CONSISTENT WITH MAMMARY ORIGIN  INVOLVING THE DERMIS.   BREAST BIOMARKER TESTS  Estrogen Receptor (ER) Status: POSITIVE, >90%  Progesterone Receptor (PgR) Status: POSITIVE, >70%  Her2 negative  2. Patient underwent bilateral diagnostic mammogram on 08/31/2016 showed:IMPRESSION: 1. Known left breast cancer, retroareolar with probable extension to the nipple, measuring 4.3 cm greatest dimension by ultrasound, with associated architectural distortion and associated left nipple retraction, and with associated diffuse skin thickening on the left. 2. No enlarged or morphologically abnormal lymph nodes are seen in the left axilla by ultrasound. 3. No evidence of malignancy within the right breast.  3. Patient was also complaining of some perirectal pain and underwent CT abdomen pelvis with contrast on 09/02/2016 which showed: IMPRESSION: Left colonic diverticulosis. No active diverticulitis.Fatty liver. No acute findings or evidence of metastatic  disease in the abdomen or pelvis. Diffuse bony sclerotic metastases.  4. Other than that her breast pain patient overall feels well. Denies any unintentional weight loss. She continues to be active and care for her great-grandchildren. She has had a hysterectomy in the past. No family history of breast or ovarian cancer.  5. Given that she had inflammatory breast cancer- plan was to give 4 cycles of dose dense AC followed by possible mastectomy and treat bone mets with AI + ibrance  6. PET CT scan on 09/11/16 showed: IMPRESSION: 1. Low-grade but abnormal hypermetabolic activity associated with the cutaneous and sub areolar glandular tissues of the left breast, compatible with malignancy, representative SUV 3.9 compared to contralateral normal side 1.4. 2. Diffuse sclerotic osseous metastatic disease with low-grade hypermetabolic activity, a representative SUV along the left sacrum 5.4. 3. Other imaging findings of potential clinical significance: Aortic Atherosclerosis (ICD10-I70.0). Descending and sigmoid colon Diverticulosis.  7. Baseline MUGA scan showed EF of 71%. Baseline CA 27.29 elevated at 563.2  8. 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  9. Letrozole and ibrance started in sept 2018. zometa monthly also started. Baseline bone density scan normal  10. ibrance held in oct 2018 after cycle 1 due to Grade 3 neutropenia and thrombocytopenia.cycle 2 restaretd on 02/04/17 at 100 mg. Patient continued to have prolongeed grade 3 neutropenia and dose lowered to 75 mg     Interval history-patient is tolerating her Ibrance well and reports no complaints today.  Denies any symptoms of fatigue, skin rash or diarrhea  ECOG PS- 0 Pain scale- 0   Review of systems- Review of Systems  Constitutional: Negative for chills, fever, malaise/fatigue and weight loss.  HENT: Negative for congestion, ear discharge and nosebleeds.   Eyes: Negative  for blurred vision.    Respiratory: Negative for cough, hemoptysis, sputum production, shortness of breath and wheezing.   Cardiovascular: Negative for chest pain, palpitations, orthopnea and claudication.  Gastrointestinal: Negative for abdominal pain, blood in stool, constipation, diarrhea, heartburn, melena, nausea and vomiting.  Genitourinary: Negative for dysuria, flank pain, frequency, hematuria and urgency.  Musculoskeletal: Negative for back pain, joint pain and myalgias.  Skin: Negative for rash.  Neurological: Negative for dizziness, tingling, focal weakness, seizures, weakness and headaches.  Endo/Heme/Allergies: Does not bruise/bleed easily.  Psychiatric/Behavioral: Negative for depression and suicidal ideas. The patient does not have insomnia.       No Known Allergies   Past Medical History:  Diagnosis Date  . Breast cancer ([place])   . Cancer ([place])    left breast  . Hypertension   . Port catheter in place 09/14/2016   Placed 09/10/2016 RT chest.     Past Surgical History:  Procedure Laterality Date  . ABDOMINAL HYSTERECTOMY    . APPENDECTOMY    . BREAST BIOPSY    . CYST EXCISION Right 09/2016   Buttocks  . PORTACATH PLACEMENT Right 09/10/2016   Procedure: INSERTION PORT-A-CATH;  Surgeon: Jules Husbands, MD;  Location: [facility] ORS;  Service: General;  Laterality: Right;    Social History   Socioeconomic History  . Marital status: Married    Spouse name: Jeneen Rinks  . Number of children: 2  . Years of education: 6  . Highest education level: Bachelor's degree (e.g., BA, AB, BS)  Social Needs  . Financial resource strain: 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  . Food insecurity - worry: Never true  . Food insecurity - inability: Never true  . Transportation needs - medical: No  . Transportation needs - non-medical: No  Occupational History  . 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 Smoking status: Never Smoker  . Smokeless tobacco: Never Used  Substance and  Sexual Activity  . Alcohol use: No  . Drug use: No  . Sexual activity: No  Other Topics Concern  . Not on file  Social History Narrative  . Not on file    Family History  Problem Relation Age of Onset  . Parkinson's disease Mother   . Bladder Cancer Father   . Lung cancer Father      Current Outpatient Medications:  .  acetaminophen (TYLENOL) 500 MG tablet, Take 1,000 mg by mouth every 6 (six) hours as needed for moderate pain., Disp: , Rfl:  .  amLODipine (NORVASC) 10 MG tablet, Take 10 mg by mouth daily., Disp: , Rfl:  .  amLODipine (NORVASC) 5 MG tablet, Take 1 tablet (5 mg total) by mouth daily. (Patient taking differently: Take 10 mg daily by mouth. ), Disp: 30 tablet, Rfl: 0 .  Calcium Carbonate (CALCIUM-CARB 600 PO), Take 1 capsule by mouth 2 (two) times daily., Disp: , Rfl:  .  dexamethasone (DECADRON) 4 MG tablet, Take 2 tablets by mouth once a day on the day after chemotherapy and then take 2 tablets two times a day for 2 days. Take with food., Disp: 30 tablet, Rfl: 1 .  diphenoxylate-atropine (LOMOTIL) 2.5-0.025 MG tablet, Take 1 tablet by mouth 4 (four) times daily as needed for diarrhea or loose stools., Disp: 30 tablet, Rfl: 0 .  hydrochlorothiazide (MICROZIDE) 12.5 MG capsule, 12.5 mg daily as needed. , Disp: , Rfl:  .  letrozole (FEMARA) 2.5 MG tablet, Take 1 tablet (2.5 mg total) by  mouth daily., Disp: 90 tablet, Rfl: 3 .  lidocaine-prilocaine (EMLA) cream, Apply to affected area once, Disp: 30 g, Rfl: 3 .  LORazepam (ATIVAN) 0.5 MG tablet, Take 1 tablet (0.5 mg total) by mouth every 6 (six) hours as needed (Nausea or vomiting). (Patient taking differently: Take 0.5 mg by mouth every 6 (six) hours as needed. Nausea or vomiting), Disp: 30 tablet, Rfl: 0 .  magic mouthwash SOLN, Take 5 mLs by mouth 4 (four) times daily as needed for mouth pain., Disp: 240 mL, Rfl: 0 .  ondansetron (ZOFRAN) 8 MG tablet, Take 1 tablet (8 mg total) by mouth 2 (two) times daily as needed.  Start on the third day after chemotherapy. (Patient taking differently: Take 8 mg by mouth 2 (two) times daily as needed for nausea. Start on the third day after chemotherapy.), Disp: 30 tablet, Rfl: 1 .  palbociclib (IBRANCE) 75 MG capsule, Take 1 capsule (75 mg total) by mouth daily with breakfast. Take for 21 days on, then 7 days off., Disp: 21 capsule, Rfl: 3 .  PREVIDENT 5000 BOOSTER PLUS 1.1 % PSTE, , Disp: , Rfl:  .  prochlorperazine (COMPAZINE) 10 MG tablet, Take 1 tablet (10 mg total) by mouth every 6 (six) hours as needed (Nausea or vomiting)., Disp: 30 tablet, Rfl: 1 .  Vitamin D, Cholecalciferol, 400 units CAPS, Take 1 capsule by mouth 2 (two) times daily., Disp: , Rfl:  No current facility-administered medications for this visit.   Facility-Administered Medications Ordered in Other Visits:  .  heparin lock flush 100 unit/mL, 500 Units, Intravenous, Once, Randa Evens C, MD .  sodium chloride flush (NS) 0.9 % injection 10 mL, 10 mL, Intravenous, PRN, Sindy Guadeloupe, MD, 10 mL at 06/03/17 1440 .  Zoledronic Acid (ZOMETA) IVPB 4 mg, 4 mg, Intravenous, Q30 days, Sindy Guadeloupe, MD, 4 mg at 06/03/17 1505  Physical exam:  Vitals:   06/03/17 1402 06/03/17 1403  BP:  (!) 156/84  Pulse:  88  Resp:  18  Temp:  98.3 F (36.8 C)  TempSrc:  Tympanic  Weight: 198 lb (89.8 kg)    Physical Exam  Constitutional: She is oriented to person, place, and time and well-developed, well-nourished, and in no distress.  HENT:  Head: Normocephalic and atraumatic.  Eyes: EOM are normal. Pupils are equal, round, and reactive to light.  Neck: Normal range of motion.  Cardiovascular: Normal rate, regular rhythm and normal heart sounds.  Pulmonary/Chest: Effort normal and breath sounds normal.  Abdominal: Soft. Bowel sounds are normal.  Neurological: She is alert and oriented to person, place, and time.  Skin: Skin is warm and dry.     CMP Latest Ref Rng & Units 06/03/2017  Glucose 65 - 99 mg/dL  151(H)  BUN 6 - 20 mg/dL 14  Creatinine 0.44 - 1.00 mg/dL 0.99  Sodium 135 - 145 mmol/L 137  Potassium 3.5 - 5.1 mmol/L 3.4(L)  Chloride 101 - 111 mmol/L 102  CO2 22 - 32 mmol/L 27  Calcium 8.9 - 10.3 mg/dL 9.8  Total Protein 6.5 - 8.1 g/dL 7.6  Total Bilirubin 0.3 - 1.2 mg/dL 1.2  Alkaline Phos 38 - 126 U/L 68  AST 15 - 41 U/L 32  ALT 14 - 54 U/L 28   CBC Latest Ref Rng & Units 06/03/2017  WBC 3.6 - 11.0 K/uL 3.4(L)  Hemoglobin 12.0 - 16.0 g/dL 13.3  Hematocrit 35.0 - 47.0 % 38.0  Platelets 150 - 440 K/uL 249  No images are attached to the encounter.  Ct Chest W Contrast  Result Date: 05/14/2017 CLINICAL DATA:  F/u for left sided breast ca. Pt stated she has been doing chemo which started in may. Denies surgery or radiation tx. PT states she has no chest pain or SOB. But does have nausea without vomiting, intermittent constipation. EXAM: CT CHEST, ABDOMEN, AND PELVIS WITH CONTRAST TECHNIQUE: Multidetector CT imaging of the chest, abdomen and pelvis was performed following the standard protocol during bolus administration of intravenous contrast. CONTRAST:  131m ISOVUE-300 IOPAMIDOL (ISOVUE-300) INJECTION 61% COMPARISON:  PET-CT 09/11/2016 FINDINGS: CT CHEST FINDINGS Cardiovascular: Port in the RIGHT chest wall with tip in distal SVC. No significant vascular findings. Normal heart size. No pericardial effusion. Mediastinum/Nodes: No axillary supraclavicular adenopathy. No internal mammary adenopathy. No mediastinal adenopathy Pericardial effusion.  Esophagus Lungs/Pleura: No suspicious pulmonary nodules Musculoskeletal: Widespread sclerotic metastasis CT ABDOMEN AND PELVIS FINDINGS Hepatobiliary: No focal hepatic lesion. No biliary ductal dilatation. Gallbladder is normal. Common bile duct is normal. Pancreas: Pancreas is normal. No ductal dilatation. No pancreatic inflammation. Spleen: Normal spleen Adrenals/urinary tract: Adrenal glands and kidneys are normal. The ureters and bladder  normal. Stomach/Bowel: Stomach, small bowel, appendix, and cecum are normal. Multiple diverticula sigmoid colon. Vascular/Lymphatic: Abdominal aorta is normal caliber with atherosclerotic calcification. There is no retroperitoneal or periportal lymphadenopathy. No pelvic lymphadenopathy. Reproductive: Post hysterectomy.  No adnexal abnormality Other: No peritoneal metastasis. No omental metastasis. No free-fluid Musculoskeletal: Widespread sclerotic osseous metastasis not changed from PET-CT IMPRESSION: Chest Impression: 1. No evidence of thoracic metastasis. 2. Widespread sclerotic skeletal metastasis Abdomen / Pelvis Impression: 1. No evidence soft tissue metastasis in the abdomen or pelvis. 2. Stable sclerotic skeletal metastasis. Electronically Signed   By: SSuzy BouchardM.D.   On: 05/14/2017 11:34   Nm Bone Scan Whole Body  Result Date: 05/14/2017 CLINICAL DATA:  Left breast cancer, on chemotherapy EXAM: NUCLEAR MEDICINE WHOLE BODY BONE SCAN TECHNIQUE: Whole body anterior and posterior images were obtained approximately 3 hours after intravenous injection of radiopharmaceutical. RADIOPHARMACEUTICALS:  23.12 mCi Technetium-968mDP IV COMPARISON:  CT chest abdomen pelvis dated 05/14/2017 FINDINGS: Surprisingly, despite the appearance on concurrent CT, the bone scan is essentially normal, aside from mild uptake in the right pelvic ring. Mild increased radiotracer uptake may be present in the calvarium and possibly the bilateral humeri, although this is equivocal. The bilateral renal shadows are visualized, indicating that this is not a super scan appearance. Notably, the sclerotic metastases were also essentially non FDG avid on prior PET. IMPRESSION: Whole-body bone scan vastly underestimates this patient's known diffuse osseous metastases throughout the visualized axial and appendicular skeleton. Electronically Signed   By: SrJulian Hy.D.   On: 05/14/2017 15:29   Ct Abdomen Pelvis W  Contrast  Result Date: 05/14/2017 CLINICAL DATA:  F/u for left sided breast ca. Pt stated she has been doing chemo which started in may. Denies surgery or radiation tx. PT states she has no chest pain or SOB. But does have nausea without vomiting, intermittent constipation. EXAM: CT CHEST, ABDOMEN, AND PELVIS WITH CONTRAST TECHNIQUE: Multidetector CT imaging of the chest, abdomen and pelvis was performed following the standard protocol during bolus administration of intravenous contrast. CONTRAST:  10053mSOVUE-300 IOPAMIDOL (ISOVUE-300) INJECTION 61% COMPARISON:  PET-CT 09/11/2016 FINDINGS: CT CHEST FINDINGS Cardiovascular: Port in the RIGHT chest wall with tip in distal SVC. No significant vascular findings. Normal heart size. No pericardial effusion. Mediastinum/Nodes: No axillary supraclavicular adenopathy. No internal mammary adenopathy. No mediastinal adenopathy  Pericardial effusion.  Esophagus Lungs/Pleura: No suspicious pulmonary nodules Musculoskeletal: Widespread sclerotic metastasis CT ABDOMEN AND PELVIS FINDINGS Hepatobiliary: No focal hepatic lesion. No biliary ductal dilatation. Gallbladder is normal. Common bile duct is normal. Pancreas: Pancreas is normal. No ductal dilatation. No pancreatic inflammation. Spleen: Normal spleen Adrenals/urinary tract: Adrenal glands and kidneys are normal. The ureters and bladder normal. Stomach/Bowel: Stomach, small bowel, appendix, and cecum are normal. Multiple diverticula sigmoid colon. Vascular/Lymphatic: Abdominal aorta is normal caliber with atherosclerotic calcification. There is no retroperitoneal or periportal lymphadenopathy. No pelvic lymphadenopathy. Reproductive: Post hysterectomy.  No adnexal abnormality Other: No peritoneal metastasis. No omental metastasis. No free-fluid Musculoskeletal: Widespread sclerotic osseous metastasis not changed from PET-CT IMPRESSION: Chest Impression: 1. No evidence of thoracic metastasis. 2. Widespread sclerotic skeletal  metastasis Abdomen / Pelvis Impression: 1. No evidence soft tissue metastasis in the abdomen or pelvis. 2. Stable sclerotic skeletal metastasis. Electronically Signed   By: Suzy Bouchard M.D.   On: 05/14/2017 11:34     Assessment and plan- Patient is a 77 y.o. female female with Stage IV ER PR positive her 2 negative breast cancer with bone only mets  Clinically patient is doing well and tolerating Ibrance and letrozole without any significant side effects.  CA-27-29 is continuing to trend down and was down to 126 from a value of 151 prior.  Levels from today are pending.  Her CBC done today shows mild leukopenia with a white count of 3.4 which is predominantly lymphopenia and there is no evidence of neutropenia.  Her H&H and platelet count is stable.  She will continue taking Ibrance 75 mg 3 weeks on and one week off along with letrozole and calcium and vitamin D   I have personally reviewed his CT chest abdomen and pelvis as well as bone scan independently and discussed the findings with her. She continues to have evidence of stable bone metastases without any evidence of disease progression or new lesions elsewhere.  Her tumor markers are also trending down indicating good response to treatment.  Patient will get Zometa today and I will see her back in 4 weeks time with CBC CMP and CA-27-29 prior to next cycle of Ibrance.   Visit Diagnosis 1. Malignant neoplasm of left breast in female, estrogen receptor positive, unspecified site of breast ([place])   2. High risk medication use   3. Macrocytosis without anemia   4. Bone metastases ([place])   5. Long-term current use of bisphosphonate      Dr. Randa Evens, MD, MPH [facility] at [facility] Pager- [phone] 06/03/2017 3:11 PM

## 2017-06-04 LAB — CANCER ANTIGEN 27.29: CA 27.29: 124.3 U/mL — ABNORMAL HIGH (ref 0.0–38.6)

## 2017-06-18 MED FILL — IBRANCE 75 MG CAPSULE: 75 | 28 days supply | Qty: 21 | Fill #1

## 2017-07-02 ENCOUNTER — Encounter: Payer: Self-pay | Admitting: Oncology

## 2017-07-02 ENCOUNTER — Inpatient Hospital Stay: Payer: Medicare Other | Attending: Oncology

## 2017-07-02 ENCOUNTER — Inpatient Hospital Stay: Payer: Medicare Other

## 2017-07-02 ENCOUNTER — Inpatient Hospital Stay (HOSPITAL_BASED_OUTPATIENT_CLINIC_OR_DEPARTMENT_OTHER): Payer: Medicare Other | Admitting: Oncology

## 2017-07-02 VITALS — BP 144/76 | HR 99 | Temp 98.6°F | Resp 18 | Ht 62.0 in | Wt 198.1 lb

## 2017-07-02 DIAGNOSIS — K6289 Other specified diseases of anus and rectum: Secondary | ICD-10-CM | POA: Diagnosis not present

## 2017-07-02 DIAGNOSIS — K760 Fatty (change of) liver, not elsewhere classified: Secondary | ICD-10-CM | POA: Insufficient documentation

## 2017-07-02 DIAGNOSIS — Z79811 Long term (current) use of aromatase inhibitors: Secondary | ICD-10-CM | POA: Diagnosis not present

## 2017-07-02 DIAGNOSIS — D171 Benign lipomatous neoplasm of skin and subcutaneous tissue of trunk: Secondary | ICD-10-CM | POA: Insufficient documentation

## 2017-07-02 DIAGNOSIS — C7951 Secondary malignant neoplasm of bone: Secondary | ICD-10-CM | POA: Diagnosis not present

## 2017-07-02 DIAGNOSIS — Z90710 Acquired absence of both cervix and uterus: Secondary | ICD-10-CM | POA: Diagnosis not present

## 2017-07-02 DIAGNOSIS — Z7983 Long term (current) use of bisphosphonates: Secondary | ICD-10-CM

## 2017-07-02 DIAGNOSIS — C50912 Malignant neoplasm of unspecified site of left female breast: Secondary | ICD-10-CM | POA: Diagnosis present

## 2017-07-02 DIAGNOSIS — Z170 Estrogen receptor positive status [ER+]: Secondary | ICD-10-CM | POA: Insufficient documentation

## 2017-07-02 DIAGNOSIS — I10 Essential (primary) hypertension: Secondary | ICD-10-CM | POA: Insufficient documentation

## 2017-07-02 DIAGNOSIS — D696 Thrombocytopenia, unspecified: Secondary | ICD-10-CM | POA: Diagnosis not present

## 2017-07-02 LAB — COMPREHENSIVE METABOLIC PANEL
ALT: 22 U/L (ref 14–54)
AST: 33 U/L (ref 15–41)
Albumin: 4.3 g/dL (ref 3.5–5.0)
Alkaline Phosphatase: 64 U/L (ref 38–126)
Anion gap: 10 (ref 5–15)
BUN: 12 mg/dL (ref 6–20)
CHLORIDE: 104 mmol/L (ref 101–111)
CO2: 22 mmol/L (ref 22–32)
CREATININE: 0.83 mg/dL (ref 0.44–1.00)
Calcium: 9.5 mg/dL (ref 8.9–10.3)
GFR calc Af Amer: >60 mL/min (ref >60)
GFR calc non Af Amer: >60 mL/min (ref >60)
Glucose, Bld: 164 mg/dL — ABNORMAL HIGH (ref 65–99)
POTASSIUM: 3.7 mmol/L (ref 3.5–5.1)
SODIUM: 136 mmol/L (ref 135–145)
Total Bilirubin: 1.1 mg/dL (ref 0.3–1.2)
Total Protein: 7.4 g/dL (ref 6.5–8.1)

## 2017-07-02 LAB — CBC WITH DIFFERENTIAL/PLATELET
Basophils Absolute: 0 10*3/uL (ref 0–0.1)
Basophils Relative: 1 %
EOS ABS: 0 10*3/uL (ref 0–0.7)
EOS PCT: 1 %
HCT: 35.8 % (ref 35.0–47.0)
HEMOGLOBIN: 12.9 g/dL (ref 12.0–16.0)
LYMPHS ABS: 1.3 10*3/uL (ref 1.0–3.6)
Lymphocytes Relative: 33 %
MCH: 37.2 pg — AB (ref 26.0–34.0)
MCHC: 36 g/dL (ref 32.0–36.0)
MCV: 103.3 fL — ABNORMAL HIGH (ref 80.0–100.0)
MONOS PCT: 9 %
Monocytes Absolute: 0.4 10*3/uL (ref 0.2–0.9)
NEUTROS PCT: 56 %
Neutro Abs: 2.1 10*3/uL (ref 1.4–6.5)
Platelets: 209 10*3/uL (ref 150–440)
RBC: 3.47 MIL/uL — AB (ref 3.80–5.20)
RDW: 16.7 % — ABNORMAL HIGH (ref 11.5–14.5)
WBC: 3.8 10*3/uL (ref 3.6–11.0)

## 2017-07-02 MED ORDER — SODIUM CHLORIDE 0.9 % IV SOLN
Freq: Once | INTRAVENOUS | Status: AC
  Administered 2017-07-02: 15:00:00 via INTRAVENOUS
  Filled 2017-07-02: qty 1000

## 2017-07-02 MED ORDER — HEPARIN SOD (PORK) LOCK FLUSH 100 UNIT/ML IV SOLN
500.0000 [IU] | Freq: Once | INTRAVENOUS | Status: AC | PRN
  Administered 2017-07-02: 500 [IU]
  Filled 2017-07-02: qty 5

## 2017-07-02 MED ORDER — ZOLEDRONIC ACID 4 MG/100ML IV SOLN
4.0000 mg | Freq: Once | INTRAVENOUS | Status: AC
  Administered 2017-07-02: 4 mg via INTRAVENOUS
  Filled 2017-07-02: qty 100

## 2017-07-02 NOTE — Progress Notes (Signed)
Increase pain noted to left thumb kunckle ( pain scale 8)

## 2017-07-03 LAB — CANCER ANTIGEN 27.29: CA 27.29: 111.3 U/mL — ABNORMAL HIGH (ref 0.0–38.6)

## 2017-07-05 NOTE — Progress Notes (Signed)
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 Patient is a 77 year old female with no significant comorbidities who noticed left breast mass about 6-9 months ago. She thought it would go awaybut it continued to increase in size and began to involve her skin. She also noted tenderness to palpation as well as intermittent sharp tingling pain. She was seen by Dr. Adora Fridge on 08/25/2016 and patient had a biopsy of her skin in his office which showed:DIAGNOSIS:  A. LEFT BREAST SKIN; EXCISION:  - INVASIVE CARCINOMA MORPHOLOGICALLY CONSISTENT WITH MAMMARY ORIGIN  INVOLVING THE DERMIS.   BREAST BIOMARKER TESTS  Estrogen Receptor (ER) Status: POSITIVE, >90%  Progesterone Receptor (PgR) Status: POSITIVE, >70%  Her2 negative  2. Patient underwent bilateral diagnostic mammogram on 08/31/2016 showed:IMPRESSION: 1. Known left breast cancer, retroareolar with probable extension to the nipple, measuring 4.3 cm greatest dimension by ultrasound, with associated architectural distortion and associated left nipple retraction, and with associated diffuse skin thickening on the left. 2. No enlarged or morphologically abnormal lymph nodes are seen in the left axilla by ultrasound. 3. No evidence of malignancy within the right breast.  3. Patient was also complaining of some perirectal pain and underwent CT abdomen pelvis with contrast on 09/02/2016 which showed: IMPRESSION: Left colonic diverticulosis. No active diverticulitis.Fatty liver. No acute findings or evidence of metastatic  disease in the abdomen or pelvis. Diffuse bony sclerotic metastases.  4. Other than that her breast pain patient overall feels well. Denies any unintentional weight loss. She continues to be active and care for her great-grandchildren. She has had a hysterectomy in the past. No family history of breast or ovarian cancer.  5. Given that she had inflammatory breast cancer- plan was to give 4 cycles of dose dense AC followed by possible mastectomy and treat bone mets with AI + ibrance  6. PET CT scan on 09/11/16 showed: IMPRESSION: 1. Low-grade but abnormal hypermetabolic activity associated with the cutaneous and sub areolar glandular tissues of the left breast, compatible with malignancy, representative SUV 3.9 compared to contralateral normal side 1.4. 2. Diffuse sclerotic osseous metastatic disease with low-grade hypermetabolic activity, a representative SUV along the left sacrum 5.4. 3. Other imaging findings of potential clinical significance: Aortic Atherosclerosis (ICD10-I70.0). Descending and sigmoid colon Diverticulosis.  7. Baseline MUGA scan showed EF of 71%. Baseline CA 27.29 elevated at 563.2  8. 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  9. Letrozole and ibrance started in sept 2018. zometa monthly also started. Baseline bone density scan normal  10. ibrance held in oct 2018 after cycle 1 due to Grade 3 neutropenia and thrombocytopenia.cycle 2 restaretd on 02/04/17 at 100 mg. Patient continued to have prolongeed grade 3 neutropenia and dose lowered to 75 mg   Interval history- she is tolerating ibrance very well at 75 mg. Denies any fatigue, skin rash of diarrhea. Hair is growing back. She denies any pain  ECOG PS- 0 Pain scale- 0   Review of systems- Review of Systems  Constitutional: Negative for chills, fever, malaise/fatigue and weight loss.  HENT: Negative for congestion, ear discharge and nosebleeds.  Eyes: Negative for blurred vision.    Respiratory: Negative for cough, hemoptysis, sputum production, shortness of breath and wheezing.   Cardiovascular: Negative for chest pain, palpitations, orthopnea and claudication.  Gastrointestinal: Negative for abdominal pain, blood in stool, constipation, diarrhea, heartburn, melena, nausea and vomiting.  Genitourinary: Negative for dysuria, flank pain, frequency, hematuria and urgency.  Musculoskeletal: Negative for back pain, joint pain and myalgias.  Skin: Negative for rash.  Neurological: Negative for dizziness, tingling, focal weakness, seizures, weakness and headaches.  Endo/Heme/Allergies: Does not bruise/bleed easily.  Psychiatric/Behavioral: Negative for depression and suicidal ideas. The patient does not have insomnia.      No Known Allergies   Past Medical History:  Diagnosis Date  . Breast cancer ([place])   . Cancer ([place])    left breast  . Hypertension   . Port catheter in place 09/14/2016   Placed 09/10/2016 RT chest.     Past Surgical History:  Procedure Laterality Date  . ABDOMINAL HYSTERECTOMY    . APPENDECTOMY    . BREAST BIOPSY    . CYST EXCISION Right 09/2016   Buttocks  . PORTACATH PLACEMENT Right 09/10/2016   Procedure: INSERTION PORT-A-CATH;  Surgeon: Jules Husbands, MD;  Location: [facility] ORS;  Service: General;  Laterality: Right;    Social History   Socioeconomic History  . Marital status: Married    Spouse name: Jeneen Rinks  . Number of children: 2  . Years of education: 6  . Highest education level: Bachelor's degree (e.g., BA, AB, BS)  Occupational History  . 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[employer]  Social Needs  . Financial resource strain: 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  . Food insecurity:    Worry: Never true    Inability: Never true  . Transportation needs:    Medical: No    Non-medical: No  Tobacco Use  . Smoking status: Never Smoker  . Smokeless tobacco: Never Used  Substance and Sexual Activity  . Alcohol use: No  .  Drug use: No  . Sexual activity: Never  Lifestyle  . Physical activity:    Days per week: Patient refused    Minutes per session: Patient refused  . Stress: Not at all  Relationships  . Social connections:    Talks on phone: More than three times a week    Gets together: Once a week    Attends religious service: More than 4 times per year    Active member of club or organization: No    Attends meetings of clubs or organizations: Never    Relationship status: Not on file  . Intimate partner violence:    Fear of current or ex partner: Not on file    Emotionally abused: Not on file    Physically abused: Not on file    Forced sexual activity: Not on file  Other Topics Concern  . Not on file  Social History Narrative  . Not on file    Family History  Problem Relation Age of Onset  . Parkinson's disease Mother   . Bladder Cancer Father   . Lung cancer Father      Current Outpatient Medications:  .  amLODipine (NORVASC) 10 MG tablet, Take 10 mg by mouth daily., Disp: , Rfl:  .  Calcium Carbonate (CALCIUM-CARB 600 PO), Take 1 capsule by mouth 2 (two) times daily., Disp: , Rfl:  .  diphenoxylate-atropine (LOMOTIL) 2.5-0.025 MG tablet, Take 1 tablet by mouth 4 (four) times daily as needed  for diarrhea or loose stools., Disp: 30 tablet, Rfl: 0 .  hydrochlorothiazide (MICROZIDE) 12.5 MG capsule, 12.5 mg daily as needed. , Disp: , Rfl:  .  letrozole (FEMARA) 2.5 MG tablet, Take 1 tablet (2.5 mg total) by mouth daily., Disp: 90 tablet, Rfl: 3 .  lidocaine-prilocaine (EMLA) cream, Apply to affected area once, Disp: 30 g, Rfl: 3 .  palbociclib (IBRANCE) 75 MG capsule, Take 1 capsule (75 mg total) by mouth daily with breakfast. Take for 21 days on, then 7 days off., Disp: 21 capsule, Rfl: 3 .  prochlorperazine (COMPAZINE) 10 MG tablet, Take 1 tablet (10 mg total) by mouth every 6 (six) hours as needed (Nausea or vomiting)., Disp: 30 tablet, Rfl: 1 .  Vitamin D, Cholecalciferol, 400 units  CAPS, Take 1 capsule by mouth 2 (two) times daily., Disp: , Rfl:  .  acetaminophen (TYLENOL) 500 MG tablet, Take 1,000 mg by mouth every 6 (six) hours as needed for moderate pain., Disp: , Rfl:  .  amLODipine (NORVASC) 5 MG tablet, Take 1 tablet (5 mg total) by mouth daily. (Patient taking differently: Take 10 mg daily by mouth. ), Disp: 30 tablet, Rfl: 0 .  dexamethasone (DECADRON) 4 MG tablet, Take 2 tablets by mouth once a day on the day after chemotherapy and then take 2 tablets two times a day for 2 days. Take with food. (Patient not taking: Reported on 07/02/2017), Disp: 30 tablet, Rfl: 1 .  LORazepam (ATIVAN) 0.5 MG tablet, Take 1 tablet (0.5 mg total) by mouth every 6 (six) hours as needed (Nausea or vomiting). (Patient not taking: Reported on 07/02/2017), Disp: 30 tablet, Rfl: 0 .  magic mouthwash SOLN, Take 5 mLs by mouth 4 (four) times daily as needed for mouth pain. (Patient not taking: Reported on 07/02/2017), Disp: 240 mL, Rfl: 0 .  ondansetron (ZOFRAN) 8 MG tablet, Take 1 tablet (8 mg total) by mouth 2 (two) times daily as needed. Start on the third day after chemotherapy. (Patient not taking: Reported on 07/02/2017), Disp: 30 tablet, Rfl: 1 .  PREVIDENT 5000 BOOSTER PLUS 1.1 % PSTE, , Disp: , Rfl:   Physical exam:  Vitals:   07/02/17 1405  BP: (!) 144/76  Pulse: 99  Resp: 18  Temp: 98.6 F (37 C)  TempSrc: Tympanic  SpO2: 97%  Weight: 198 lb 1.6 oz (89.9 kg)  Height: '5\' 2"'$  (1.575 m)   Physical Exam  Constitutional: She is oriented to person, place, and time and well-developed, well-nourished, and in no distress.  HENT:  Head: Normocephalic and atraumatic.  Eyes: Pupils are equal, round, and reactive to light. EOM are normal.  Neck: Normal range of motion.  Cardiovascular: Normal rate, regular rhythm and normal heart sounds.  Pulmonary/Chest: Effort normal and breath sounds normal.  Abdominal: Soft. Bowel sounds are normal.  Neurological: She is alert and oriented to person,  place, and time.  Skin: Skin is warm and dry.   there is persistent skin thickening over left left breast and there is a palpable ill defined mass in the sub areolar region which appears significantly smaller since diagnosis  CMP Latest Ref Rng & Units 07/02/2017  Glucose 65 - 99 mg/dL 164(H)  BUN 6 - 20 mg/dL 12  Creatinine 0.44 - 1.00 mg/dL 0.83  Sodium 135 - 145 mmol/L 136  Potassium 3.5 - 5.1 mmol/L 3.7  Chloride 101 - 111 mmol/L 104  CO2 22 - 32 mmol/L 22  Calcium 8.9 - 10.3 mg/dL 9.5  Total Protein  6.5 - 8.1 g/dL 7.4  Total Bilirubin 0.3 - 1.2 mg/dL 1.1  Alkaline Phos 38 - 126 U/L 64  AST 15 - 41 U/L 33  ALT 14 - 54 U/L 22   CBC Latest Ref Rng & Units 07/02/2017  WBC 3.6 - 11.0 K/uL 3.8  Hemoglobin 12.0 - 16.0 g/dL 12.9  Hematocrit 35.0 - 47.0 % 35.8  Platelets 150 - 440 K/uL 209    Component     Latest Ref Rng & Units 10/29/2016 11/19/2016 12/31/2016 01/28/2017  CA 27.29     0.0 - 38.6 U/mL      CA 27.29     0.0 - 38.6 U/mL 739.8 (H) 582.1 (H) 294.3 (H) 268.4 (H)   Component     Latest Ref Rng & Units 03/02/2017 04/05/2017 05/06/2017 06/03/2017  CA 27.29     0.0 - 38.6 U/mL      CA 27.29     0.0 - 38.6 U/mL 178.8 (H) 151.3 (H) 126.9 (H) 124.3 (H)   Component     Latest Ref Rng & Units 07/02/2017  CA 27.29     0.0 - 38.6 U/mL   CA 27.29     0.0 - 38.6 U/mL 111.3 (H)    Assessment and plan- Patient is a 77 y.o. female with Stage IV ER PR positive her 2 negative breast cancer with bone only mets  Clinically patient is doing well and tolerating lower dose of ibrance without any side effects. CA 27.29 continues to trend down. Imaging from feb 2019 showed dtable bone mets. No new metastases. Continue ibrance 2 weeks on and 1 week off along with letrozole. She startes next cycle today  I will see her back in 1 month with cbc with diff, cmp and CA 27.29.   Zometa today and in 1 month.     Visit Diagnosis 1. Malignant neoplasm of left breast in female, estrogen  receptor positive, unspecified site of breast ([place])   2. Bone metastases ([place])   3. Long term (current) use of bisphosphonates      Dr. Randa Evens, MD, MPH [facility] Pager- [phone] 07/05/2017 10:13 AM

## 2017-07-21 MED FILL — IBRANCE 75 MG CAPSULE: 75 | 28 days supply | Qty: 21 | Fill #2

## 2017-08-03 ENCOUNTER — Inpatient Hospital Stay: Payer: Medicare Other | Admitting: Oncology

## 2017-08-03 ENCOUNTER — Inpatient Hospital Stay: Payer: Medicare Other

## 2017-08-03 ENCOUNTER — Encounter: Payer: Self-pay | Admitting: Oncology

## 2017-08-03 ENCOUNTER — Inpatient Hospital Stay: Payer: Medicare Other | Attending: Oncology

## 2017-08-03 VITALS — BP 143/80 | HR 80 | Temp 99.1°F | Resp 18 | Ht 62.0 in | Wt 199.5 lb

## 2017-08-03 DIAGNOSIS — Z170 Estrogen receptor positive status [ER+]: Secondary | ICD-10-CM | POA: Insufficient documentation

## 2017-08-03 DIAGNOSIS — Z79899 Other long term (current) drug therapy: Secondary | ICD-10-CM

## 2017-08-03 DIAGNOSIS — C7951 Secondary malignant neoplasm of bone: Secondary | ICD-10-CM

## 2017-08-03 DIAGNOSIS — Z90710 Acquired absence of both cervix and uterus: Secondary | ICD-10-CM | POA: Insufficient documentation

## 2017-08-03 DIAGNOSIS — K6289 Other specified diseases of anus and rectum: Secondary | ICD-10-CM | POA: Insufficient documentation

## 2017-08-03 DIAGNOSIS — Z79811 Long term (current) use of aromatase inhibitors: Secondary | ICD-10-CM

## 2017-08-03 DIAGNOSIS — D171 Benign lipomatous neoplasm of skin and subcutaneous tissue of trunk: Secondary | ICD-10-CM | POA: Diagnosis not present

## 2017-08-03 DIAGNOSIS — C50912 Malignant neoplasm of unspecified site of left female breast: Secondary | ICD-10-CM

## 2017-08-03 DIAGNOSIS — Z7983 Long term (current) use of bisphosphonates: Secondary | ICD-10-CM

## 2017-08-03 DIAGNOSIS — K760 Fatty (change of) liver, not elsewhere classified: Secondary | ICD-10-CM | POA: Insufficient documentation

## 2017-08-03 DIAGNOSIS — D696 Thrombocytopenia, unspecified: Secondary | ICD-10-CM | POA: Insufficient documentation

## 2017-08-03 DIAGNOSIS — I10 Essential (primary) hypertension: Secondary | ICD-10-CM | POA: Diagnosis not present

## 2017-08-03 LAB — COMPREHENSIVE METABOLIC PANEL
ALT: 20 U/L (ref 14–54)
AST: 30 U/L (ref 15–41)
Albumin: 4 g/dL (ref 3.5–5.0)
Alkaline Phosphatase: 63 U/L (ref 38–126)
Anion gap: 10 (ref 5–15)
BUN: 13 mg/dL (ref 6–20)
CHLORIDE: 103 mmol/L (ref 101–111)
CO2: 22 mmol/L (ref 22–32)
CREATININE: 0.82 mg/dL (ref 0.44–1.00)
Calcium: 9.4 mg/dL (ref 8.9–10.3)
GFR calc non Af Amer: >60 mL/min (ref >60)
Glucose, Bld: 158 mg/dL — ABNORMAL HIGH (ref 65–99)
POTASSIUM: 3.8 mmol/L (ref 3.5–5.1)
SODIUM: 135 mmol/L (ref 135–145)
Total Bilirubin: 1.1 mg/dL (ref 0.3–1.2)
Total Protein: 7.1 g/dL (ref 6.5–8.1)

## 2017-08-03 LAB — CBC WITH DIFFERENTIAL/PLATELET
Basophils Absolute: 0.1 10*3/uL (ref 0–0.1)
Basophils Relative: 2 %
EOS ABS: 0 10*3/uL (ref 0–0.7)
Eosinophils Relative: 1 %
HCT: 34.2 % — ABNORMAL LOW (ref 35.0–47.0)
HEMOGLOBIN: 12.3 g/dL (ref 12.0–16.0)
LYMPHS PCT: 25 %
Lymphs Abs: 0.9 10*3/uL — ABNORMAL LOW (ref 1.0–3.6)
MCH: 37.2 pg — AB (ref 26.0–34.0)
MCHC: 36 g/dL (ref 32.0–36.0)
MCV: 103.3 fL — ABNORMAL HIGH (ref 80.0–100.0)
MONOS PCT: 5 %
Monocytes Absolute: 0.2 10*3/uL (ref 0.2–0.9)
NEUTROS PCT: 67 %
Neutro Abs: 2.3 10*3/uL (ref 1.4–6.5)
Platelets: 236 10*3/uL (ref 150–440)
RBC: 3.31 MIL/uL — AB (ref 3.80–5.20)
RDW: 15.7 % — ABNORMAL HIGH (ref 11.5–14.5)
WBC: 3.4 10*3/uL — AB (ref 3.6–11.0)

## 2017-08-03 MED ORDER — HEPARIN SOD (PORK) LOCK FLUSH 100 UNIT/ML IV SOLN
250.0000 [IU] | Freq: Once | INTRAVENOUS | Status: AC | PRN
  Administered 2017-08-03: 500 [IU]

## 2017-08-03 MED ORDER — ZOLEDRONIC ACID 4 MG/100ML IV SOLN
4.0000 mg | Freq: Once | INTRAVENOUS | Status: AC
  Administered 2017-08-03: 4 mg via INTRAVENOUS
  Filled 2017-08-03: qty 100

## 2017-08-03 MED ORDER — SODIUM CHLORIDE 0.9 % IV SOLN
Freq: Once | INTRAVENOUS | Status: AC
  Administered 2017-08-03: 15:00:00 via INTRAVENOUS
  Filled 2017-08-03: qty 1000

## 2017-08-03 NOTE — Progress Notes (Signed)
Port flushes without difficulty, no redness or swelling at site noted, pt denies pain, no blood return noted. Per Teressa Lower per Dr. Janese Banks okay to proceed with port use for Zometa treatment.

## 2017-08-03 NOTE — Progress Notes (Signed)
No new changes noted today 

## 2017-08-03 NOTE — Progress Notes (Signed)
Hematology/Oncology Consult note [facility]  Telephone:([phone] Fax:([phone]  Patient Care Team: Letta Median, MD as PCP - General (Family Medicine)   Name of the patient: Victoria Steele  [id number]  1940-09-11   Date of visit: 08/03/17  Diagnosis- Stage IV invasive mammary carcinoma [id number] with bone only metastases  Chief complaint/ Reason for visit- f/u of breast cancer on letrozole plus ibrance  Heme/Onc history:1. Patient is a 77 year old female with no significant comorbidities who noticed left breast mass about 6-9 months ago. She thought it would go awaybut it continued to increase in size and began to involve her skin. She also noted tenderness to palpation as well as intermittent sharp tingling pain. She was seen by Dr. Adora Fridge on 08/25/2016 and patient had a biopsy of her skin in his office which showed:DIAGNOSIS:  A. LEFT BREAST SKIN; EXCISION:  - INVASIVE CARCINOMA MORPHOLOGICALLY CONSISTENT WITH MAMMARY ORIGIN  INVOLVING THE DERMIS.   BREAST BIOMARKER TESTS  Estrogen Receptor (ER) Status: POSITIVE, >90%  Progesterone Receptor (PgR) Status: POSITIVE, >70%  Her2 negative  2. Patient underwent bilateral diagnostic mammogram on 08/31/2016 showed:IMPRESSION: 1. Known left breast cancer, retroareolar with probable extension to the nipple, measuring 4.3 cm greatest dimension by ultrasound, with associated architectural distortion and associated left nipple retraction, and with associated diffuse skin thickening on the left. 2. No enlarged or morphologically abnormal lymph nodes are seen in the left axilla by ultrasound. 3. No evidence of malignancy within the right breast.  3. Patient was also complaining of some perirectal pain and underwent CT abdomen pelvis with contrast on 09/02/2016 which showed: IMPRESSION: Left colonic diverticulosis. No active diverticulitis.Fatty liver. No acute findings or evidence of metastatic  disease in the abdomen or pelvis. Diffuse bony sclerotic metastases.  4. Other than that her breast pain patient overall feels well. Denies any unintentional weight loss. She continues to be active and care for her great-grandchildren. She has had a hysterectomy in the past. No family history of breast or ovarian cancer.  5. Given that she had inflammatory breast cancer- plan was to give 4 cycles of dose dense AC followed by possible mastectomy and treat bone mets with AI + ibrance  6. PET CT scan on 09/11/16 showed: IMPRESSION: 1. Low-grade but abnormal hypermetabolic activity associated with the cutaneous and sub areolar glandular tissues of the left breast, compatible with malignancy, representative SUV 3.9 compared to contralateral normal side 1.4. 2. Diffuse sclerotic osseous metastatic disease with low-grade hypermetabolic activity, a representative SUV along the left sacrum 5.4. 3. Other imaging findings of potential clinical significance: Aortic Atherosclerosis (ICD10-I70.0). Descending and sigmoid colon Diverticulosis.  7. Baseline MUGA scan showed EF of 71%. Baseline CA 27.29 elevated at 563.2  8. 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  9. Letrozole and ibrance started in sept 2018. zometa monthly also started. Baseline bone density scan normal  10. ibrance held in oct 2018 after cycle 1 due to Grade 3 neutropenia and thrombocytopenia.cycle 2 restaretd on 02/04/17 at 100 mg. Patient continued to have prolongeed grade 3 neutropenia and dose lowered to 75 mg  Interval history- tolerating ibrance well except for mild fatigue. She feels winded when she walks short distances. She is concerned about her weight gain  ECOG PS- 1 Pain scale- 0   Review of systems- Review of Systems  Constitutional: Positive for malaise/fatigue. Negative for chills, fever and weight loss.  HENT: Negative for congestion, ear discharge and nosebleeds.   Eyes:  Negative for  blurred vision.  Respiratory: Negative for cough, hemoptysis, sputum production, shortness of breath and wheezing.   Cardiovascular: Negative for chest pain, palpitations, orthopnea and claudication.  Gastrointestinal: Negative for abdominal pain, blood in stool, constipation, diarrhea, heartburn, melena, nausea and vomiting.  Genitourinary: Negative for dysuria, flank pain, frequency, hematuria and urgency.  Musculoskeletal: Negative for back pain, joint pain and myalgias.  Skin: Negative for rash.  Neurological: Negative for dizziness, tingling, focal weakness, seizures, weakness and headaches.  Endo/Heme/Allergies: Does not bruise/bleed easily.  Psychiatric/Behavioral: Negative for depression and suicidal ideas. The patient does not have insomnia.       No Known Allergies   Past Medical History:  Diagnosis Date  . Breast cancer ([place])   . Cancer ([place])    left breast  . Hypertension   . Port catheter in place 09/14/2016   Placed 09/10/2016 RT chest.     Past Surgical History:  Procedure Laterality Date  . ABDOMINAL HYSTERECTOMY    . APPENDECTOMY    . BREAST BIOPSY    . CYST EXCISION Right 09/2016   Buttocks  . PORTACATH PLACEMENT Right 09/10/2016   Procedure: INSERTION PORT-A-CATH;  Surgeon: Jules Husbands, MD;  Location: [facility] ORS;  Service: General;  Laterality: Right;    Social History   Socioeconomic History  . Marital status: Married    Spouse name: Jeneen Rinks  . Number of children: 2  . Years of education: 6  . Highest education level: Bachelor's degree (e.g., BA, AB, BS)  Occupational History  . 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 Financial resource strain: 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  . Food insecurity:    Worry: Never true    Inability: Never true  . Transportation needs:    Medical: No    Non-medical: No  Tobacco Use  . Smoking status: Never Smoker  . Smokeless tobacco: Never Used  Substance and Sexual Activity  .  Alcohol use: No  . Drug use: No  . Sexual activity: Never  Lifestyle  . Physical activity:    Days per week: Patient refused    Minutes per session: Patient refused  . Stress: Not at all  Relationships  . Social connections:    Talks on phone: More than three times a week    Gets together: Once a week    Attends religious service: More than 4 times per year    Active member of club or organization: No    Attends meetings of clubs or organizations: Never    Relationship status: Not on file  . Intimate partner violence:    Fear of current or ex partner: Not on file    Emotionally abused: Not on file    Physically abused: Not on file    Forced sexual activity: Not on file  Other Topics Concern  . Not on file  Social History Narrative  . Not on file    Family History  Problem Relation Age of Onset  . Parkinson's disease Mother   . Bladder Cancer Father   . Lung cancer Father      Current Outpatient Medications:  .  amLODipine (NORVASC) 10 MG tablet, Take 10 mg by mouth daily., Disp: , Rfl:  .  Calcium Carbonate (CALCIUM-CARB 600 PO), Take 1 capsule by mouth 2 (two) times daily., Disp: , Rfl:  .  diphenoxylate-atropine (LOMOTIL) 2.5-0.025 MG tablet, Take 1 tablet by mouth 4 (four) times daily as needed for  diarrhea or loose stools., Disp: 30 tablet, Rfl: 0 .  hydrochlorothiazide (MICROZIDE) 12.5 MG capsule, 12.5 mg daily as needed. , Disp: , Rfl:  .  letrozole (FEMARA) 2.5 MG tablet, Take 1 tablet (2.5 mg total) by mouth daily., Disp: 90 tablet, Rfl: 3 .  ondansetron (ZOFRAN) 8 MG tablet, Take 1 tablet (8 mg total) by mouth 2 (two) times daily as needed. Start on the third day after chemotherapy., Disp: 30 tablet, Rfl: 1 .  palbociclib (IBRANCE) 75 MG capsule, Take 1 capsule (75 mg total) by mouth daily with breakfast. Take for 21 days on, then 7 days off., Disp: 21 capsule, Rfl: 3 .  prochlorperazine (COMPAZINE) 10 MG tablet, Take 1 tablet (10 mg total) by mouth every 6 (six)  hours as needed (Nausea or vomiting)., Disp: 30 tablet, Rfl: 1 .  Vitamin D, Cholecalciferol, 400 units CAPS, Take 1 capsule by mouth 2 (two) times daily., Disp: , Rfl:  .  acetaminophen (TYLENOL) 500 MG tablet, Take 1,000 mg by mouth every 6 (six) hours as needed for moderate pain., Disp: , Rfl:  .  amLODipine (NORVASC) 5 MG tablet, Take 1 tablet (5 mg total) by mouth daily. (Patient taking differently: Take 10 mg daily by mouth. ), Disp: 30 tablet, Rfl: 0 .  dexamethasone (DECADRON) 4 MG tablet, Take 2 tablets by mouth once a day on the day after chemotherapy and then take 2 tablets two times a day for 2 days. Take with food. (Patient not taking: Reported on 07/02/2017), Disp: 30 tablet, Rfl: 1 .  lidocaine-prilocaine (EMLA) cream, Apply to affected area once (Patient not taking: Reported on 08/03/2017), Disp: 30 g, Rfl: 3 .  LORazepam (ATIVAN) 0.5 MG tablet, Take 1 tablet (0.5 mg total) by mouth every 6 (six) hours as needed (Nausea or vomiting). (Patient not taking: Reported on 07/02/2017), Disp: 30 tablet, Rfl: 0 .  magic mouthwash SOLN, Take 5 mLs by mouth 4 (four) times daily as needed for mouth pain. (Patient not taking: Reported on 07/02/2017), Disp: 240 mL, Rfl: 0 .  PREVIDENT 5000 BOOSTER PLUS 1.1 % PSTE, , Disp: , Rfl:   Physical exam:  Vitals:   08/03/17 1338  BP: (!) 143/80  Pulse: 80  Resp: 18  Temp: 99.1 F (37.3 C)  TempSrc: Tympanic  SpO2: 98%  Weight: 199 lb 8 oz (90.5 kg)  Height: _0  (1.575 m)   Physical Exam  Constitutional: She is oriented to person, place, and time. She appears well-developed and well-nourished.  HENT:  Head: Normocephalic and atraumatic.  Eyes: Pupils are equal, round, and reactive to light. EOM are normal.  Neck: Normal range of motion.  Cardiovascular: Normal rate, regular rhythm and normal heart sounds.  Pulmonary/Chest: Effort normal and breath sounds normal.  Abdominal: Soft. Bowel sounds are normal.  Neurological: She is alert and oriented  to person, place, and time.  Skin: Skin is warm and dry.     CMP Latest Ref Rng & Units 08/03/2017  Glucose 65 - 99 mg/dL 158(H)  BUN 6 - 20 mg/dL 13  Creatinine 0.44 - 1.00 mg/dL 0.82  Sodium 135 - 145 mmol/L 135  Potassium 3.5 - 5.1 mmol/L 3.8  Chloride 101 - 111 mmol/L 103  CO2 22 - 32 mmol/L 22  Calcium 8.9 - 10.3 mg/dL 9.4  Total Protein 6.5 - 8.1 g/dL 7.1  Total Bilirubin 0.3 - 1.2 mg/dL 1.1  Alkaline Phos 38 - 126 U/L 63  AST 15 - 41 U/L 30  ALT  14 - 54 U/L 20   CBC Latest Ref Rng & Units 08/03/2017  WBC 3.6 - 11.0 K/uL 3.4(L)  Hemoglobin 12.0 - 16.0 g/dL 12.3  Hematocrit 35.0 - 47.0 % 34.2(L)  Platelets 150 - 440 K/uL 236     Assessment and plan- Patient is a 77 y.o. female Stage IV ER PR positive her 2 negative breast cancer with bone only mets  Overall patient is tolerating Ibrance well except for mild fatigue.  She will continue 75 mg of Ibrance 3 weeks on and  1 week off.  Zometa today.  She will come back in 4 weeks with CBC CMP and CA-27-29 for next dose of Zometa.  I will see her back in 8 weeks with the same set of labs.  She will continue taking letrozole along with calcium and vitamin D daily.    Visit Diagnosis 1. Malignant neoplasm of left breast in female, estrogen receptor positive, unspecified site of breast ([place])   2. Bone metastases ([place])   3. High risk medication use   4. Long term (current) use of bisphosphonates      Dr. Randa Evens, MD, MPH [facility] [phone] 08/03/2017 2:52 PM

## 2017-08-04 LAB — CANCER ANTIGEN 27.29: CA 27.29: 97.7 U/mL — ABNORMAL HIGH (ref 0.0–38.6)

## 2017-08-09 ENCOUNTER — Telehealth: Payer: Self-pay | Admitting: *Deleted

## 2017-08-09 DIAGNOSIS — C50912 Malignant neoplasm of unspecified site of left female breast: Secondary | ICD-10-CM

## 2017-08-09 DIAGNOSIS — Z170 Estrogen receptor positive status [ER+]: Principal | ICD-10-CM

## 2017-08-09 NOTE — Telephone Encounter (Signed)
Yes ok with adding those labs.

## 2017-08-09 NOTE — Telephone Encounter (Signed)
Patient called and said she saw her PCP Rutherford Guys and she gave her a [facility] form and said that when she comes over here next time she could get tsh and hgb a1c drawn at the same time as cancer center labs> I told her that I will need to check with Dr. Janese Banks and let her know. The next labs draw is on 5/21.I will call her back with answer.

## 2017-08-11 NOTE — Telephone Encounter (Signed)
Dr. Janese Banks said yes to labs and I called pt back to let her know that we can add labs on and it will be 5/21 on her next labs. I will add the labs on to the 5/21 date. Pt agreeable to this.

## 2017-08-13 ENCOUNTER — Telehealth: Payer: Self-pay | Admitting: Pharmacist

## 2017-08-13 NOTE — Telephone Encounter (Signed)
Oral Chemotherapy Pharmacist Encounter   Attempted to reach patient for follow up on oral medication: Ibrance (palbociclib). No answer. Left VM for patient to call back.    Darl Pikes, PharmD, BCPS Hematology/Oncology Clinical Pharmacist [facility] [phone]  08/13/2017 5:15 PM

## 2017-08-19 MED FILL — IBRANCE 75 MG CAPSULE: 75 | 28 days supply | Qty: 21 | Fill #3

## 2017-08-19 NOTE — Telephone Encounter (Signed)
Oral Chemotherapy Pharmacist Encounter   Attempted to reach patient for follow up on oral medication: Ibrance (palbociclib). No answer. Left VM for patient to call back.   Darl Pikes, PharmD, BCPS Hematology/Oncology Clinical Pharmacist [facility] [phone]  08/19/2017 11:52 AM

## 2017-08-20 NOTE — Telephone Encounter (Signed)
Oral Chemotherapy Pharmacist Encounter  Follow-Up Form  Called patient today to follow up regarding patient's oral chemotherapy medication: Ibrance (palbociclib)  Original Start date of oral chemotherapy: 12/2016  Pt reports 0 tablets/doses of Ibrance missed in the last cycle.   Pt reports the following side effects: None reported  Recent labs reviewed: CA 27.29 and CBC from 08/03/17  New medications?: None reported  Other Issues: None reported  Patient knows to call the office with questions or concerns. [facility] Clinic will continue to follow.  Darl Pikes, PharmD, BCPS Hematology/Oncology Clinical Pharmacist [facility] [phone]  08/20/2017 10:53 AM

## 2017-08-23 ENCOUNTER — Ambulatory Visit
Admission: RE | Admit: 2017-08-23 | Discharge: 2017-08-23 | Disposition: A | Discharge status: Home / Self Care | Payer: Medicare Other | Source: Ambulatory Visit | Attending: Oncology | Admitting: Oncology

## 2017-08-23 ENCOUNTER — Other Ambulatory Visit: Payer: Self-pay | Admitting: Nurse Practitioner

## 2017-08-23 ENCOUNTER — Telehealth: Payer: Self-pay | Admitting: *Deleted

## 2017-08-23 DIAGNOSIS — Z170 Estrogen receptor positive status [ER+]: Secondary | ICD-10-CM | POA: Insufficient documentation

## 2017-08-23 DIAGNOSIS — I82491 Acute embolism and thrombosis of other specified deep vein of right lower extremity: Secondary | ICD-10-CM | POA: Insufficient documentation

## 2017-08-23 DIAGNOSIS — M79604 Pain in right leg: Secondary | ICD-10-CM

## 2017-08-23 DIAGNOSIS — C50912 Malignant neoplasm of unspecified site of left female breast: Secondary | ICD-10-CM | POA: Insufficient documentation

## 2017-08-23 DIAGNOSIS — M7989 Other specified soft tissue disorders: Secondary | ICD-10-CM

## 2017-08-23 DIAGNOSIS — C7951 Secondary malignant neoplasm of bone: Secondary | ICD-10-CM | POA: Diagnosis present

## 2017-08-23 DIAGNOSIS — M25561 Pain in right knee: Secondary | ICD-10-CM

## 2017-08-23 MED ORDER — APIXABAN 5 MG PO TABS: ORAL_TABLET | ORAL | 1 refills | Status: DC

## 2017-08-23 NOTE — Progress Notes (Signed)
Received phone call from Jenny Burns, NP requesting medication be sent in for patient for acute DVT. Called patient's pharmacy to check on stock and coverage. They do not have Xarelto in stock. They have Eliquis in stock but do not have a starter pack. Will be 2 days for one to arrive. Per pharmacist, monthly supply of 60 tablets is approximately $20 however insurance denied supply of 74 tablets (which is quantity of starter pack). Prescription sent for 60 tablets.  Called patient and husband to confirm teaching provided by Mrs. Burns and convey information regarding quantity and encourage patient to receive additional teaching with pharmacist. Also requested Mrs. Burns to request starter pack and education kit for patient.  

## 2017-08-23 NOTE — Telephone Encounter (Signed)
Pt called clinical line and left message to let me know that pt having right leg swelling, right leg pain. It started this weekend and her big toe in red and she had muscle cramping in her calf but then it finally went away but still is swollen and now the swelling is in her thigh. She does have a red streak running up or down leg. I called Sonia Baller and she said to do u/s stat and I called sch. And got it done stat and pt needs to come on over and I called pt back and she said that she will have her husband take her there as quick as she can go. I called back to Sonia Baller to let her know that I told u/s to call Sonia Baller with results

## 2017-08-26 ENCOUNTER — Telehealth: Payer: Self-pay | Admitting: *Deleted

## 2017-08-26 NOTE — Telephone Encounter (Signed)
Called pt to let her know about appt for vascular doctor.  It was scheduled for 5/21 and I called her to tell her about it and then I noticed that her appt has been changed to 5/17.  When I spoke to patient she was angry with me. She states that she called me and left message on tues and I have not called her back. I told her that I have checked my messages every day this week and there was no message from her.  She states I don't care about her enough to call without her calling me and leave a message.  I told her that I got the appt for her yest. Afternoon and I was with other patients and was planning to call her today with the appt since it was for next week only to find out the office moved appt up which I am glad of but I did not know it was moved up.  She states the vascular office called her and let her know they were moving it up.  She states that she could have a life threatening blood clot and I act like I don't care.  I told her that over her course of care every time she has ever called me I always call her back and I would never not call her if she needed my asstistance.  I apologized for her feeling like I do not care because  I really do care about every patient I work for. She told me that maybe my phone is not working correctly. She asked me if I need to tell her anything else and I said no and she hung up.

## 2017-08-27 ENCOUNTER — Encounter (INDEPENDENT_AMBULATORY_CARE_PROVIDER_SITE_OTHER): Payer: Self-pay | Admitting: Vascular Surgery

## 2017-08-27 ENCOUNTER — Encounter (INDEPENDENT_AMBULATORY_CARE_PROVIDER_SITE_OTHER): Payer: Self-pay

## 2017-08-27 ENCOUNTER — Ambulatory Visit (INDEPENDENT_AMBULATORY_CARE_PROVIDER_SITE_OTHER): Payer: Medicare Other | Admitting: Vascular Surgery

## 2017-08-27 ENCOUNTER — Other Ambulatory Visit (INDEPENDENT_AMBULATORY_CARE_PROVIDER_SITE_OTHER): Payer: Self-pay | Admitting: Vascular Surgery

## 2017-08-27 VITALS — BP 179/77 | HR 65 | Resp 15 | Ht 62.0 in | Wt 199.0 lb

## 2017-08-27 DIAGNOSIS — Z170 Estrogen receptor positive status [ER+]: Secondary | ICD-10-CM

## 2017-08-27 DIAGNOSIS — I10 Essential (primary) hypertension: Secondary | ICD-10-CM | POA: Insufficient documentation

## 2017-08-27 DIAGNOSIS — I82411 Acute embolism and thrombosis of right femoral vein: Secondary | ICD-10-CM

## 2017-08-27 DIAGNOSIS — C50912 Malignant neoplasm of unspecified site of left female breast: Secondary | ICD-10-CM | POA: Diagnosis not present

## 2017-08-27 DIAGNOSIS — I82409 Acute embolism and thrombosis of unspecified deep veins of unspecified lower extremity: Secondary | ICD-10-CM | POA: Insufficient documentation

## 2017-08-27 NOTE — Assessment & Plan Note (Signed)
This increases her clotting risk and is likely a cause of her being hypercoagulable.

## 2017-08-27 NOTE — Assessment & Plan Note (Signed)
Placed last year by her general surgeon and working well.

## 2017-08-27 NOTE — Assessment & Plan Note (Signed)
blood pressure control important in reducing the progression of atherosclerotic disease. On appropriate oral medications.  

## 2017-08-27 NOTE — Progress Notes (Signed)
Patient ID: Victoria Steele, female   DOB: November 29, 1940, 77 y.o.   MRN: [id number]  Chief Complaint  Patient presents with  . Follow-up    DVT on Right leg    HPI Victoria Steele is a 77 y.o. female.  I am asked to see the patient by Beckey Rutter, NP for evaluation of extensive RLE DVT.  The patient reports over the weekend, beginning to have a cramp in her calf and knee area.  This was associated with some swelling.  Over about a 24 to 48-hour span, the pain intensified and the swelling worsened.  She had no clear inciting event or causative trauma that started the symptoms.  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  As her symptoms progressed, by Tuesday she presented to her oncologist where she was found to have a very extensive right lower extremity DVT.  This basically went from the calf veins up to the femoral vein which was the most proximal that is visualized at the hospital.  She was started on anticoagulation which has helped the swelling and pain some.  It is still quite profound.  No left leg symptoms.  No chest pain or shortness of breath.  No fevers or chills.   Past Medical History:  Diagnosis Date  . Breast cancer ([place])   . Cancer ([place])    left breast  . DVT (deep venous thrombosis) ([place])    2019  . Hypertension   . Port catheter in place 09/14/2016   Placed 09/10/2016 RT chest.    Past Surgical History:  Procedure Laterality Date  . ABDOMINAL HYSTERECTOMY    . APPENDECTOMY    . BREAST BIOPSY    . CYST EXCISION Right 09/2016   Buttocks  . PORTACATH PLACEMENT Right 09/10/2016   Procedure: INSERTION PORT-A-CATH;  Surgeon: Jules Husbands, MD;  Location: [facility];  Service: General;  Laterality: Right;    Family History  Problem Relation Age of Onset  . Parkinson's disease Mother   . Bladder Cancer Father   . Lung cancer Father   No bleeding or clotting disorders  Social History Social History   Tobacco Use  . Smoking status: Never  Smoker  . Smokeless tobacco: Never Used  Substance Use Topics  . Alcohol use: No  . Drug use: No    No Known Allergies  Current Outpatient Medications  Medication Sig Dispense Refill  . acetaminophen (TYLENOL) 500 MG tablet Take 1,000 mg by mouth every 6 (six) hours as needed for moderate pain.    Marland Kitchen amLODipine (NORVASC) 10 MG tablet Take 10 mg by mouth daily.    Marland Kitchen amLODipine (NORVASC) 5 MG tablet Take 1 tablet (5 mg total) by mouth daily. (Patient taking differently: Take 10 mg daily by mouth. ) 30 tablet 0  . apixaban (ELIQUIS) 5 MG TABS tablet Take two-52m tablets twice daily for 7 days. On day 8, switch to one-582mtablet twice daily. 60 tablet 1  . Calcium Carbonate (CALCIUM-CARB 600 PO) Take 1 capsule by mouth 2 (two) times daily.    . Marland Kitchenexamethasone (DECADRON) 4 MG tablet Take 2 tablets by mouth once a day on the day after chemotherapy and then take 2 tablets two times a day for 2 days. Take with food. (Patient not taking: Reported on 07/02/2017) 30 tablet 1  . diphenoxylate-atropine (LOMOTIL) 2.5-0.025 MG tablet Take 1 tablet by mouth 4 (four) times daily as needed for diarrhea or loose stools. 30 tablet  0  . hydrochlorothiazide (MICROZIDE) 12.5 MG capsule 12.5 mg daily as needed.     Marland Kitchen letrozole (FEMARA) 2.5 MG tablet Take 1 tablet (2.5 mg total) by mouth daily. 90 tablet 3  . lidocaine-prilocaine (EMLA) cream Apply to affected area once (Patient not taking: Reported on 08/03/2017) 30 g 3  . LORazepam (ATIVAN) 0.5 MG tablet Take 1 tablet (0.5 mg total) by mouth every 6 (six) hours as needed (Nausea or vomiting). (Patient not taking: Reported on 07/02/2017) 30 tablet 0  . magic mouthwash SOLN Take 5 mLs by mouth 4 (four) times daily as needed for mouth pain. (Patient not taking: Reported on 07/02/2017) 240 mL 0  . ondansetron (ZOFRAN) 8 MG tablet Take 1 tablet (8 mg total) by mouth 2 (two) times daily as needed. Start on the third day after chemotherapy. 30 tablet 1  . palbociclib (IBRANCE)  75 MG capsule Take 1 capsule (75 mg total) by mouth daily with breakfast. Take for 21 days on, then 7 days off. 21 capsule 3  . PREVIDENT 5000 BOOSTER PLUS 1.1 % PSTE     . prochlorperazine (COMPAZINE) 10 MG tablet Take 1 tablet (10 mg total) by mouth every 6 (six) hours as needed (Nausea or vomiting). 30 tablet 1  . Vitamin D, Cholecalciferol, 400 units CAPS Take 1 capsule by mouth 2 (two) times daily.     No current facility-administered medications for this visit.       REVIEW OF SYSTEMS (Negative unless checked)  Constitutional: _0 Weight loss  _1 Fever  _2 Chills Cardiac: _3 Chest pain   _4 Chest pressure   _5 Palpitations   _6 Shortness of breath when laying flat   _7 Shortness of breath at rest   _8 Shortness of breath with exertion. Vascular:  _9 Pain in legs with walking   _10 Pain in legs at rest   _11 Pain in legs when laying flat   _12 Claudication   _13 Pain in feet when walking  _14 Pain in feet at rest  _15 Pain in feet when laying flat   _16 History of DVT   _17 Phlebitis   _18 Swelling in legs   _19 Varicose veins   _20 Non-healing ulcers Pulmonary:   _21 Uses home oxygen   _22 Productive cough   _23 Hemoptysis   _24 Wheeze  _25 COPD   _26 Asthma Neurologic:  _27 Dizziness  _28 Blackouts   _29 Seizures   _30 History of stroke   _31 History of TIA  _32 Aphasia   _33 Temporary blindness   _34 Dysphagia   _35 Weakness or numbness in arms   _36 Weakness or numbness in legs Musculoskeletal:  _37 Arthritis   _38 Joint swelling   _39 Joint pain   _40 Low back pain Hematologic:  _41 Easy bruising  _42 Easy bleeding   _43 Hypercoagulable state   _44 Anemic  _45 Hepatitis Gastrointestinal:  _46 Blood in stool   _47 Vomiting blood  _48 Gastroesophageal reflux/heartburn   _49 Abdominal pain Genitourinary:  _50 Chronic kidney disease   _51 Difficult urination  _52 Frequent urination  _53 Burning with urination   _54 Hematuria Skin:  _55 Rashes   _56 Ulcers   _57 Wounds Psychological:  _58 History of anxiety   _59  History of major depression.    Physical Exam BP (!) 179/77 (BP Location:  Right Arm, Patient Position: Sitting)   Pulse 65   Resp 15   Ht _60  (1.575 m)   Wt 199 lb (90.3 kg)   BMI 36.40 kg/m  Gen:  WD/WN, NAD. Appears younger than stated age. Head: [place]/AT, No temporalis wasting.  Ear/Nose/Throat: Hearing grossly intact, nares w/o erythema or drainage, oropharynx w/o Erythema/Exudate Eyes: Conjunctiva clear, sclera non-icteric  Neck: trachea midline.  No JVD.  Pulmonary:  Good air  movement, respirations not labored, no use of accessory muscles Cardiac: RRR Vascular:  Vessel Right Left  Radial Palpable Palpable                      Popliteal Not Palpable 1+ Palpable  PT Trace Palpable Palpable  DP Palpable Palpable   Gastrointestinal: soft, non-tender/non-distended.  Musculoskeletal: M/S 5/5 throughout.  Extremities without ischemic changes.  No deformity or atrophy.  Venous congestion is present in the right lower leg and foot area.  3+ right lower extremity edema.  No left lower extremity edema Neurologic: Sensation grossly intact in extremities.  Symmetrical.  Speech is fluent. Motor exam as listed above. Psychiatric: Judgment intact, Mood & affect appropriate for pt's clinical situation. Dermatologic: No rashes or ulcers noted.  No cellulitis or open wounds.    Radiology US Venous Img Lower Unilateral Right  Result Date: 08/23/2017 CLINICAL DATA:  Pain and swelling x3 days EXAM: RIGHT LOWER EXTREMITY VENOUS DOPPLER ULTRASOUND TECHNIQUE: Gray-scale sonography with compression, as well as color and duplex ultrasound, were performed to evaluate the deep venous system from the level of the common femoral vein through the popliteal and proximal calf veins. COMPARISON:  None FINDINGS: There is noncompressible occlusive thrombus in the peroneal veins which has propagated through the popliteal , femoral, and common femoral vein and extends into deep femoral vein. Posterior tibial veins remain patent. IMPRESSION: 1. POSITIVE for extensive occlusive RIGHT  lower extremity DVT from posterior tibial veins through the femoral-popliteal system as above. Electronically Signed   By: Lucrezia Europe M.D.   On: 08/23/2017 16:30    Labs Recent Results (from the past 2160 hour(s))  Cancer antigen 27.29     Status: Abnormal   Collection Time: 06/03/17  1:48 PM  Result Value Ref Range   CA 27.29 124.3 (H) 0.0 - 38.6 U/mL    Comment: (NOTE) Siemens Centaur Immunochemiluminometric Methodology (ICMA) Values obtained with different assay methods or kits cannot be used interchangeably. Results cannot be interpreted as absolute evidence of the presence or absence of malignant disease. Performed At: [facility] [place], [place] [id number] Rush Farmer MD [id number] Performed at [facility] [address] [place], [place] [address]   Comprehensive metabolic panel     Status: Abnormal   Collection Time: 06/03/17  1:48 PM  Result Value Ref Range   Sodium 137 135 - 145 mmol/L   Potassium 3.4 (L) 3.5 - 5.1 mmol/L   Chloride 102 101 - 111 mmol/L   CO2 27 22 - 32 mmol/L   Glucose, Bld 151 (H) 65 - 99 mg/dL   BUN 14 6 - 20 mg/dL   Creatinine, Ser 0.99 0.44 - 1.00 mg/dL   Calcium 9.8 8.9 - 10.3 mg/dL   Total Protein 7.6 6.5 - 8.1 g/dL   Albumin 4.3 3.5 - 5.0 g/dL   AST 32 15 - 41 U/L   ALT 28 14 - 54 U/L   Alkaline Phosphatase 68 38 - 126 U/L   Total Bilirubin 1.2 0.3 - 1.2 mg/dL   GFR calc non Af Amer 55 (L) >60 mL/min   GFR calc Af Amer >60 >60 mL/min    Comment: (NOTE) The eGFR has been calculated using the CKD EPI equation. This calculation has not been validated in all clinical situations. eGFR's persistently <60 mL/min signify possible Chronic Kidney Disease.    Anion gap 8 5 - 15    Comment: Performed at [facility], [address] [place], [place] [address]  CBC with Differential     Status: Abnormal   Collection Time: 06/03/17  1:48 PM  Result Value Ref Range   WBC 3.4 (L) 3.6 - 11.0 K/uL   RBC  3.77 (L) 3.80 - 5.20 MIL/uL   Hemoglobin 13.3 12.0 - 16.0 g/dL   HCT 38.0 35.0 - 47.0 %   MCV 100.9 (H) 80.0 - 100.0 fL   MCH 35.4 (H) 26.0 - 34.0 pg   MCHC 35.1 32.0 - 36.0 g/dL   RDW 19.1 (H) 11.5 - 14.5 %   Platelets 249 150 - 440 K/uL   Neutrophils Relative % 60 %   Neutro Abs 2.1 1.4 - 6.5 K/uL   Lymphocytes Relative 31 %   Lymphs Abs 1.1 1.0 - 3.6 K/uL   Monocytes Relative 7 %   Monocytes Absolute 0.3 0.2 - 0.9 K/uL   Eosinophils Relative 1 %   Eosinophils Absolute 0.0 0 - 0.7 K/uL   Basophils Relative 1 %   Basophils Absolute 0.0 0 - 0.1 K/uL    Comment: Performed at [facility], [address] [place], [place] [address]  Cancer antigen 27.29     Status: Abnormal   Collection Time: 07/02/17  1:17 PM  Result Value Ref Range   CA 27.29 111.3 (H) 0.0 - 38.6 U/mL    Comment: (NOTE) Siemens Centaur Immunochemiluminometric Methodology (ICMA) Values obtained with different assay methods or kits cannot be used interchangeably. Results cannot be interpreted as absolute evidence of the presence or absence of malignant disease. Performed At: [facility] [place], [place] [id number] Rush Farmer MD [id number] Performed at [facility], [address] [place], [place] [address]   Comprehensive metabolic panel     Status: Abnormal   Collection Time: 07/02/17  1:17 PM  Result Value Ref Range   Sodium 136 135 - 145 mmol/L   Potassium 3.7 3.5 - 5.1 mmol/L   Chloride 104 101 - 111 mmol/L   CO2 22 22 - 32 mmol/L   Glucose, Bld 164 (H) 65 - 99 mg/dL   BUN 12 6 - 20 mg/dL   Creatinine, Ser 0.83 0.44 - 1.00 mg/dL   Calcium 9.5 8.9 - 10.3 mg/dL   Total Protein 7.4 6.5 - 8.1 g/dL   Albumin 4.3 3.5 - 5.0 g/dL   AST 33 15 - 41 U/L   ALT 22 14 - 54 U/L   Alkaline Phosphatase 64 38 - 126 U/L   Total Bilirubin 1.1 0.3 - 1.2 mg/dL   GFR calc non Af Amer >60 >60 mL/min   GFR calc Af Amer >60 >60 mL/min    Comment: (NOTE) The eGFR has been  calculated using the CKD EPI equation. This calculation has not been validated in all clinical situations. eGFR's persistently <60 mL/min signify possible Chronic Kidney Disease.    Anion gap 10 5 - 15    Comment: Performed at [facility], [address] [place], [place] [address]  CBC with Differential/Platelet     Status: Abnormal   Collection Time: 07/02/17  1:17 PM  Result Value Ref Range   WBC 3.8 3.6 - 11.0 K/uL   RBC 3.47 (L) 3.80 - 5.20 MIL/uL   Hemoglobin 12.9 12.0 - 16.0 g/dL   HCT 35.8 35.0 - 47.0 %   MCV 103.3 (H) 80.0 - 100.0 fL   MCH 37.2 (H) 26.0 - 34.0 pg   MCHC 36.0 32.0 - 36.0 g/dL  RDW 16.7 (H) 11.5 - 14.5 %   Platelets 209 150 - 440 K/uL   Neutrophils Relative % 56 %   Neutro Abs 2.1 1.4 - 6.5 K/uL   Lymphocytes Relative 33 %   Lymphs Abs 1.3 1.0 - 3.6 K/uL   Monocytes Relative 9 %   Monocytes Absolute 0.4 0.2 - 0.9 K/uL   Eosinophils Relative 1 %   Eosinophils Absolute 0.0 0 - 0.7 K/uL   Basophils Relative 1 %   Basophils Absolute 0.0 0 - 0.1 K/uL    Comment: Performed at [facility], [place], [place] [address]  Cancer antigen 27.29     Status: Abnormal   Collection Time: 08/03/17  1:29 PM  Result Value Ref Range   CA 27.29 97.7 (H) 0.0 - 38.6 U/mL    Comment: (NOTE) Siemens Centaur Immunochemiluminometric Methodology (ICMA) Values obtained with different assay methods or kits cannot be used interchangeably. Results cannot be interpreted as absolute evidence of the presence or absence of malignant disease. Performed At: [facility] [place], [place] [id number] Rush Farmer MD [id number] Performed at [facility], [address] [place], [place] [address]   Comprehensive metabolic panel     Status: Abnormal   Collection Time: 08/03/17  1:29 PM  Result Value Ref Range   Sodium 135 135 - 145 mmol/L   Potassium 3.8 3.5 - 5.1 mmol/L   Chloride 103 101 - 111 mmol/L   CO2 22 22 - 32  mmol/L   Glucose, Bld 158 (H) 65 - 99 mg/dL   BUN 13 6 - 20 mg/dL   Creatinine, Ser 0.82 0.44 - 1.00 mg/dL   Calcium 9.4 8.9 - 10.3 mg/dL   Total Protein 7.1 6.5 - 8.1 g/dL   Albumin 4.0 3.5 - 5.0 g/dL   AST 30 15 - 41 U/L   ALT 20 14 - 54 U/L   Alkaline Phosphatase 63 38 - 126 U/L   Total Bilirubin 1.1 0.3 - 1.2 mg/dL   GFR calc non Af Amer >60 >60 mL/min   GFR calc Af Amer >60 >60 mL/min    Comment: (NOTE) The eGFR has been calculated using the CKD EPI equation. This calculation has not been validated in all clinical situations. eGFR's persistently <60 mL/min signify possible Chronic Kidney Disease.    Anion gap 10 5 - 15    Comment: Performed at [facility], [address] [place], [place] [address]  CBC with Differential/Platelet     Status: Abnormal   Collection Time: 08/03/17  1:29 PM  Result Value Ref Range   WBC 3.4 (L) 3.6 - 11.0 K/uL   RBC 3.31 (L) 3.80 - 5.20 MIL/uL   Hemoglobin 12.3 12.0 - 16.0 g/dL   HCT 34.2 (L) 35.0 - 47.0 %   MCV 103.3 (H) 80.0 - 100.0 fL   MCH 37.2 (H) 26.0 - 34.0 pg   MCHC 36.0 32.0 - 36.0 g/dL   RDW 15.7 (H) 11.5 - 14.5 %   Platelets 236 150 - 440 K/uL   Neutrophils Relative % 67 %   Neutro Abs 2.3 1.4 - 6.5 K/uL   Lymphocytes Relative 25 %   Lymphs Abs 0.9 (L) 1.0 - 3.6 K/uL   Monocytes Relative 5 %   Monocytes Absolute 0.2 0.2 - 0.9 K/uL   Eosinophils Relative 1 %   Eosinophils Absolute 0.0 0 - 0.7 K/uL   Basophils Relative 2 %   Basophils Absolute 0.1 0 -  0.1 K/uL    Comment: Performed at [facility], [address] [place], [place] [address]    Assessment/Plan:  Breast cancer in female [facility]) This increases her clotting risk and is likely a cause of her being hypercoagulable.  Hypertension blood pressure control important in reducing the progression of atherosclerotic disease. On appropriate oral medications.   Port catheter in place Placed last year by her Education officer, environmental and working well.  DVT (deep  venous thrombosis) ([place]) The patient has an extensive right lower extremity DVT with profound pain and swelling in the right leg.  This is a serious and life-threatening situation.  She has been appropriately started on anticoagulation which is the primary treatment for her DVT.  For DVTs this symptomatic and extensive, consideration for venous intervention is now given.  Her symptoms are only less than a week old so she is within a window that thrombolytic therapy could be considered and effective.  I discussed in detail the risks and benefits of thrombolytic therapy/thrombectomy and IVC filter placement.  I discussed why we placed the IVC filter.  I have discussed that this would be taken out 1 to 2 months after the procedure.  I discussed the risks of bleeding, nephrotoxicity, and recurrent thromboses.  She should continue her anticoagulation as well.  She will be on anticoagulation following the procedure for a minimum of 6 months.  I have discussed that her malignancy was likely the cause of her coagulable state.  After discussions with the patient, she desires to proceed with venous thrombectomy and thrombolytic therapy.  This has been scheduled for 08/30/2017      Leotis Pain 08/27/2017, 10:35 AM   This note was created with Dragon medical transcription system.  Any errors from dictation are unintentional.

## 2017-08-27 NOTE — Assessment & Plan Note (Signed)
The patient has an extensive right lower extremity DVT with profound pain and swelling in the right leg.  This is a serious and life-threatening situation.  She has been appropriately started on anticoagulation which is the primary treatment for her DVT.  For DVTs this symptomatic and extensive, consideration for venous intervention is now given.  Her symptoms are only less than a week old so she is within a window that thrombolytic therapy could be considered and effective.  I discussed in detail the risks and benefits of thrombolytic therapy/thrombectomy and IVC filter placement.  I discussed why we placed the IVC filter.  I have discussed that this would be taken out 1 to 2 months after the procedure.  I discussed the risks of bleeding, nephrotoxicity, and recurrent thromboses.  She should continue her anticoagulation as well.  She will be on anticoagulation following the procedure for a minimum of 6 months.  I have discussed that her malignancy was likely the cause of her coagulable state.  After discussions with the patient, she desires to proceed with venous thrombectomy and thrombolytic therapy.  This has been scheduled for 08/30/2017

## 2017-08-27 NOTE — Patient Instructions (Signed)
Deep Vein Thrombosis Deep vein thrombosis (DVT) is a condition in which a blood clot forms in a deep vein, such as a lower leg, thigh, or arm vein. A clot is blood that has thickened into a gel or solid. This condition is dangerous. It can lead to serious and even life-threatening complications if the clot travels to the lungs and causes a blockage (pulmonary embolism). It can also damage veins in the leg. This can result in leg pain, swelling, discoloration, and sores (post-thrombotic syndrome). What are the causes? This condition may be caused by:  A slowdown of blood flow.  Damage to a vein.  A condition that makes blood clot more easily.  What increases the risk? The following factors may make you more likely to develop this condition:  Being overweight.  Being elderly, especially over age 60.  Sitting or lying down for more than four hours.  Lack of physical activity (sedentary lifestyle).  Being pregnant, giving birth, or having recently given birth.  Taking medicines that contain estrogen.  Smoking.  A history of any of the following: ? Blood clots or blood clotting disease. ? Peripheral vascular disease. ? Inflammatory bowel disease. ? Cancer. ? Heart disease. ? Genetic conditions that affect how blood clots. ? Neurological diseases that affect the legs (leg paresis). ? Injury. ? Major or lengthy surgery. ? A central line placed inside a large vein.  What are the signs or symptoms? Symptoms of this condition include:  Swelling, pain, or tenderness in an arm or leg.  Warmth, redness, or discoloration in an arm or leg.  If the clot is in your leg, symptoms may be more noticeable or worse when you stand or walk. Some people do not have any symptoms. How is this diagnosed? This condition is diagnosed with:  A medical history.  A physical exam.  Tests, such as: ? Blood tests. These are done to see how your blood clots. ? Imaging tests. These are done to  check for clots. Tests may include:  Ultrasound.  CT scan.  MRI.  X-ray.  Venogram. For this test, X-rays are taken after a dye is injected into a vein.  How is this treated? Treatment for this condition depends on the cause, your risk for bleeding or developing more clots, and any medical conditions you have. Treatment may include:  Taking blood thinners (also called anticoagulants). These medicines may be taken by mouth, injected under the skin, or injected through an IV tube (catheter). These medicines prevent clots from forming.  Injecting medicine that dissolves blood clots into the affected vein (catheter-directed thrombolysis).  Having surgery. Surgery may be done to: ? Remove the clot. ? Place a filter in a large vein to catch blood clots before they reach the lungs.  Some treatments may be continued for up to six months. Follow these instructions at home: If you are taking an oral blood thinner:  Take the medicine exactly as told by your health care provider. Some blood thinners need to be taken at the same time every day. Do not skip a dose.  Ask your health care provider about what foods and drugs interact with the medicine.  Ask about possible side effects. General instructions  Blood thinners can cause easy bruising and difficulty stopping bleeding. Because of this, if you are taking or were given a blood thinner: ? Hold pressure over cuts for longer than usual. ? Tell your dentist and other health care providers that you are taking blood thinners before   having any procedures that can cause bleeding. ? Avoid contact sports.  Take over-the-counter and prescription medicines only as told by your health care provider.  Return to your normal activities as told by your health care provider. Ask your health care provider what activities are safe for you.  Wear compression stockings if recommended by your health care provider.  Keep all follow-up visits as told by  your health care provider. This is important. How is this prevented? To lower your risk of developing this condition again:  For 30 or more minutes every day, do an activity that: ? Involves moving your arms and legs. ? Increases your heart rate.  When traveling for longer than four hours: ? Exercise your arms and legs every hour. ? Drink plenty of water. ? Avoid drinking alcohol.  Avoid sitting or lying for a long time without moving your legs.  Stay a healthy weight.  If you are a woman who is older than age 35, avoid unnecessary use of medicines that contain estrogen.  Do not use any products that contain nicotine or tobacco, such as cigarettes and e-cigarettes. This is especially important if you take estrogen medicines. If you need help quitting, ask your health care provider.  Contact a health care provider if:  You miss a dose of your blood thinner.  You have nausea, vomiting, or diarrhea that lasts for more than one day.  Your menstrual period is heavier than usual.  You have unusual bruising. Get help right away if:  You have new or increased pain, swelling, or redness in an arm or leg.  You have numbness or tingling in an arm or leg.  You have shortness of breath.  You have chest pain.  You have a rapid or irregular heartbeat.  You feel light-headed or dizzy.  You cough up blood.  There is blood in your vomit, stool, or urine.  You have a serious fall or accident, or you hit your head.  You have a severe headache or confusion.  You have a cut that will not stop bleeding. These symptoms may represent a serious problem that is an emergency. Do not wait to see if the symptoms will go away. Get medical help right away. Call your local emergency services (911 in the [place] Do not drive yourself to the hospital. Summary  DVT is a condition in which a blood clot forms in a deep vein, such as a lower leg, thigh, or arm vein.  Symptoms can include swelling,  warmth, pain, and redness in your leg or arm.  Treatment may include taking blood thinners, injecting medicine that dissolves blood clots,wearing compression stockings, or surgery.  If you are prescribed blood thinners, take them exactly as told. This information is not intended to replace advice given to you by your health care provider. Make sure you discuss any questions you have with your health care provider. Document Released: 03/30/2005 Document Revised: 05/02/2016 Document Reviewed: 05/02/2016 Elsevier Interactive Patient Education  2018 [employer]  

## 2017-08-29 MED ORDER — CEFAZOLIN SODIUM-DEXTROSE 1-4 GM/50ML-% IV SOLN
1.0000 g | Freq: Once | INTRAVENOUS | Status: AC
  Administered 2017-08-30: 1 g via INTRAVENOUS
  Filled 2017-08-29: qty 50

## 2017-08-30 ENCOUNTER — Ambulatory Visit
Admission: RE | Admit: 2017-08-30 | Discharge: 2017-08-30 | Disposition: A | Discharge status: Home / Self Care | Payer: Medicare Other | Source: Ambulatory Visit | Attending: Vascular Surgery | Admitting: Vascular Surgery

## 2017-08-30 ENCOUNTER — Encounter
Admission: RE | Disposition: A | Discharge status: Home / Self Care | Payer: Self-pay | Source: Ambulatory Visit | Attending: Vascular Surgery

## 2017-08-30 DIAGNOSIS — I10 Essential (primary) hypertension: Secondary | ICD-10-CM | POA: Insufficient documentation

## 2017-08-30 DIAGNOSIS — Z170 Estrogen receptor positive status [ER+]: Secondary | ICD-10-CM | POA: Diagnosis not present

## 2017-08-30 DIAGNOSIS — Z801 Family history of malignant neoplasm of trachea, bronchus and lung: Secondary | ICD-10-CM | POA: Insufficient documentation

## 2017-08-30 DIAGNOSIS — Z8052 Family history of malignant neoplasm of bladder: Secondary | ICD-10-CM | POA: Diagnosis not present

## 2017-08-30 DIAGNOSIS — Z9889 Other specified postprocedural states: Secondary | ICD-10-CM | POA: Insufficient documentation

## 2017-08-30 DIAGNOSIS — I82401 Acute embolism and thrombosis of unspecified deep veins of right lower extremity: Secondary | ICD-10-CM | POA: Diagnosis not present

## 2017-08-30 DIAGNOSIS — Z79899 Other long term (current) drug therapy: Secondary | ICD-10-CM | POA: Diagnosis not present

## 2017-08-30 DIAGNOSIS — I82411 Acute embolism and thrombosis of right femoral vein: Secondary | ICD-10-CM | POA: Insufficient documentation

## 2017-08-30 DIAGNOSIS — Z90710 Acquired absence of both cervix and uterus: Secondary | ICD-10-CM | POA: Insufficient documentation

## 2017-08-30 DIAGNOSIS — C50912 Malignant neoplasm of unspecified site of left female breast: Secondary | ICD-10-CM | POA: Diagnosis not present

## 2017-08-30 DIAGNOSIS — Z7901 Long term (current) use of anticoagulants: Secondary | ICD-10-CM | POA: Diagnosis not present

## 2017-08-30 DIAGNOSIS — I82499 Acute embolism and thrombosis of other specified deep vein of unspecified lower extremity: Secondary | ICD-10-CM

## 2017-08-30 DIAGNOSIS — Z820 Family history of epilepsy and other diseases of the nervous system: Secondary | ICD-10-CM | POA: Diagnosis not present

## 2017-08-30 HISTORY — PX: PERIPHERAL VASCULAR THROMBECTOMY: CATH118306

## 2017-08-30 LAB — GLUCOSE, CAPILLARY: Glucose-Capillary: 125 mg/dL — ABNORMAL HIGH (ref 65–99)

## 2017-08-30 SURGERY — PERIPHERAL VASCULAR THROMBECTOMY
Anesthesia: Moderate Sedation | Laterality: Right

## 2017-08-30 MED ORDER — ALTEPLASE 2 MG IJ SOLR
INTRAMUSCULAR | Status: AC
  Filled 2017-08-30: qty 10

## 2017-08-30 MED ORDER — HEPARIN (PORCINE) IN NACL 1000-0.9 UT/500ML-% IV SOLN
INTRAVENOUS | Status: AC
  Filled 2017-08-30: qty 1500

## 2017-08-30 MED ORDER — HEPARIN SODIUM (PORCINE) 1000 UNIT/ML IJ SOLN
INTRAMUSCULAR | Status: AC
  Filled 2017-08-30: qty 1

## 2017-08-30 MED ORDER — MIDAZOLAM HCL 5 MG/5ML IJ SOLN
INTRAMUSCULAR | Status: AC
  Filled 2017-08-30: qty 5

## 2017-08-30 MED ORDER — ALTEPLASE 2 MG IJ SOLR
INTRAMUSCULAR | Status: DC | PRN
  Administered 2017-08-30: 10 mg

## 2017-08-30 MED ORDER — METHYLPREDNISOLONE SODIUM SUCC 125 MG IJ SOLR: 125.0000 mg | INTRAMUSCULAR | Status: DC | PRN

## 2017-08-30 MED ORDER — FAMOTIDINE 20 MG PO TABS: 40.0000 mg | ORAL_TABLET | ORAL | Status: DC | PRN

## 2017-08-30 MED ORDER — FENTANYL CITRATE (PF) 100 MCG/2ML IJ SOLN
INTRAMUSCULAR | Status: AC
  Filled 2017-08-30: qty 2

## 2017-08-30 MED ORDER — ONDANSETRON HCL 4 MG/2ML IJ SOLN
4.0000 mg | Freq: Once | INTRAMUSCULAR | Status: AC
  Administered 2017-08-30: 4 mg via INTRAVENOUS

## 2017-08-30 MED ORDER — MIDAZOLAM HCL 2 MG/2ML IJ SOLN
INTRAMUSCULAR | Status: DC | PRN
  Administered 2017-08-30 (×3): 1 mg via INTRAVENOUS
  Administered 2017-08-30: 2 mg via INTRAVENOUS

## 2017-08-30 MED ORDER — HEPARIN (PORCINE) IN NACL 1000-0.9 UT/500ML-% IV SOLN
INTRAVENOUS | Status: AC
  Filled 2017-08-30: qty 1000

## 2017-08-30 MED ORDER — ONDANSETRON HCL 4 MG/2ML IJ SOLN
INTRAMUSCULAR | Status: AC
  Filled 2017-08-30: qty 2

## 2017-08-30 MED ORDER — HEPARIN SODIUM (PORCINE) 1000 UNIT/ML IJ SOLN
INTRAMUSCULAR | Status: DC | PRN
  Administered 2017-08-30: 3000 [IU] via INTRAVENOUS

## 2017-08-30 MED ORDER — IOPAMIDOL (ISOVUE-300) INJECTION 61%
INTRAVENOUS | Status: DC | PRN
  Administered 2017-08-30: 40 mL via INTRAVENOUS

## 2017-08-30 MED ORDER — SODIUM CHLORIDE 0.9 % IV SOLN
INTRAVENOUS | Status: DC
  Administered 2017-08-30: 12:00:00 via INTRAVENOUS

## 2017-08-30 MED ORDER — FENTANYL CITRATE (PF) 100 MCG/2ML IJ SOLN
INTRAMUSCULAR | Status: DC | PRN
  Administered 2017-08-30: 25 ug via INTRAVENOUS
  Administered 2017-08-30: 50 ug via INTRAVENOUS
  Administered 2017-08-30 (×2): 25 ug via INTRAVENOUS

## 2017-08-30 MED ORDER — LIDOCAINE-EPINEPHRINE (PF) 1 %-1:200000 IJ SOLN
INTRAMUSCULAR | Status: AC
  Filled 2017-08-30: qty 30

## 2017-08-30 SURGICAL SUPPLY — 19 items
BALLN ULTRVRSE 12X80X75 (BALLOONS) ×3
BALLN ULTRVRSE 8X200X130 (BALLOONS)
BALLN ULTRVRSE 8X200X75 (BALLOONS) ×3
BALLOON ULTRVRSE 12X80X75 (BALLOONS) ×1 IMPLANT
BALLOON ULTRVRSE 8X200X130 (BALLOONS) IMPLANT
BALLOON ULTRVRSE 8X200X75 (BALLOONS) ×1 IMPLANT
CANISTER PENUMBRA ENGINE (MISCELLANEOUS) ×3 IMPLANT
CANNULA 5F STIFF (CANNULA) ×3 IMPLANT
CATH INDIGO CAT8 TORQ 85 KIT (CATHETERS) ×3 IMPLANT
CATH INDIGO SEP 8 (CATHETERS) ×3 IMPLANT
CATH INFUS 90CMX50CM (CATHETERS) ×2
CATH INFUS UNIFUSE 90X50 5FR (CATHETERS) ×1 IMPLANT
DEVICE PRESTO INFLATION (MISCELLANEOUS) ×6 IMPLANT
DEVICE SAFEGUARD 24CM (GAUZE/BANDAGES/DRESSINGS) ×3 IMPLANT
FILTER VC CELECT-FEMORAL (Filter) ×3 IMPLANT
PACK ANGIOGRAPHY (CUSTOM PROCEDURE TRAY) ×3 IMPLANT
SHEATH BRITE TIP 8FRX11 (SHEATH) ×3 IMPLANT
WIRE J 3MM .035X145CM (WIRE) ×3 IMPLANT
WIRE MAGIC TOR.035 180C (WIRE) ×3 IMPLANT

## 2017-08-30 NOTE — Op Note (Signed)
[facility]   OPERATIVE NOTE   PRE-OPERATIVE DIAGNOSIS: extensive right lower extremity DVT  POST-OPERATIVE DIAGNOSIS: same   PROCEDURE: 1. US guidance for vascular access to left femoral vein and right lesser saphenous vein 2. Catheter placement into IVC from left femoral vein approach and right lesser saphenous vein approach 3. IVC gram and right lower extremity venogram 4. IVC filter placement 5.   Catheter directed thrombolysis with 10 mg of TPA to the right popliteal vein, superficial femoral vein, common femoral vein, and iliac veins  6. Mechanical thrombectomy to the right popliteal vein, superficial femoral vein, common femoral vein, and iliac veins 7. PTA of popliteal vein, superficial femoral vein, common femoral vein, and iliac veins with 8 mm balloon 8. PTA of right iliac veins and common femoral vein with 12 mm balloon   SURGEON: Leotis Pain, MD  ASSISTANT(S): none  ANESTHESIA: local with moderate conscious sedation for 75 minutes using 5 mg of Versed and 125 Mcg of Fentanyl  ESTIMATED BLOOD LOSS: 400 cc  FINDING(S): 1. Extensive right lower extremity DVT throughout the popliteal vein, superficial femoral vein, common femoral vein, and iliac veins  SPECIMEN(S): none  INDICATIONS:  Patient is a 77 y.o. female who presents with extensive right lower extremity DVT with about 1 week of symptoms. Patient has marked leg swelling and pain. Venous intervention is performed to reduce the symtpoms and avoid long term postphlebitic symptoms.   DESCRIPTION: After obtaining full informed written consent, the patient was brought back to the vascular suite and placed supine upon the table.Moderate conscious sedation was administered during a face to face encounter with the patient throughout the procedure with my supervision of the RN administering medicines and monitoring the patient's vital signs, pulse oximetry, telemetry and mental  status throughout from the start of the procedure until the patient was taken to the recovery room. After obtaining adequate anesthesia, the patient was prepped and draped in the standard fashion. The left femoral vein was then accessed under US guidance and found to be widely patent. It was accessed without difficulty and a permanent image was recorded. I then placed the delivery sheath into the IVC. The IVC was patent and the renal veins were at the level of L1. The retrievable IVC filter was then deployed at the level of the L1-L2 interspace. The delivery sheath was then removed and dressings were placed in the left groin.  The patient was then placed into the prone position. The right lesser saphenous vein vein was then accessed under direct ultrasound guidance without difficulty with a micropuncture needle and a permanent image was recorded. I then upsized to an 8Fr sheath over a J wire. 3000 units of heparin were then given. Imaging showed extensive DVT with minimal flow. A Kumpe catheter and Magic tourque wire were then advanced into the CFV and images were performed. There was extensive thrombus that was occlusive in the iliac veins both the external and common iliac veins. I was able to cross the thrombus and stenosis and advance into the IVC which was patent. I then used the 90 cm total length 50 cm working length thrombolytic catheter and instilled 10 mg of tpa throughout the right popliteal, superficial femoral, common femoral, and iliac veins.  After this dwelled for 15 minutes, I used the Penumbra Cat cat 8 catheter and evacuated about 150 cc of effluent with mechanical thrombectomy throughout the iliac veins, CFV, SFV, and popliteal vein. This had mild improvement. I then treated the  popliteal vein, SFV, and CFV with a long 20 cm length, 8 mm diameter angioplasty balloon to open a channel. I also advanced this into the iliac veins to open a channel there as well.  A total of 3  inflations with this long 8 mm balloon were performed with each inflation being 10 to 12 atm for a minute.  This resulted in some resolution of the thrombus and improved flow. I then turned my attention to the iliac veins. The stenosis/occlusion and thrombus was treated with a 12 mm diameter by 8 cm length angioplasty balloon.  2 inflations were required in the iliac veins and these inflations were 6 to 8 atm for a minute.  There remained significant thrombus in the common femoral vein and iliac veins.  The SFV and popliteal veins had marked improvement with mild residual thrombosis.  Another pass with the penumbra cat 8 catheter in the common femoral vein and iliac vein location was performed for about 250 cc of effluent returned with the mechanical thrombectomy device.  The iliac veins actually cleared with only about a 10 to 15% residual stenosis and thrombus remaining.  The common femoral vein continued to have a nearly occlusive blob of thrombus.  I then upsized to the 12 mm diameter by 8 cm length angioplasty balloon to treat the common femoral vein and this was inflated to 8 atm for 1 minute.  Completion imaging showed only about a 30% residual stenosis with mild residual thrombus present in the common femoral vein now.  I felt we had markedly improved her flow.  I then elected to terminate the procedure. The sheath was removed and a dressing was placed. She was taken to the recovery room in stable condition having tolerated the procedure well.   COMPLICATIONS: None  CONDITION: Stable  Leotis Pain 08/30/2017 3:01 PM

## 2017-08-30 NOTE — H&P (Signed)
[facility] History & Physical Update  The patient was interviewed and re-examined.  The patient's previous History and Physical has been reviewed and is unchanged.  There is no change in the plan of care. We plan to proceed with the scheduled procedure.  Leotis Pain, MD  08/30/2017, 11:22 AM

## 2017-08-31 ENCOUNTER — Ambulatory Visit (INDEPENDENT_AMBULATORY_CARE_PROVIDER_SITE_OTHER): Payer: Self-pay | Admitting: Vascular Surgery

## 2017-08-31 ENCOUNTER — Encounter: Payer: Self-pay | Admitting: Vascular Surgery

## 2017-08-31 ENCOUNTER — Inpatient Hospital Stay: Payer: Medicare Other

## 2017-09-02 ENCOUNTER — Inpatient Hospital Stay: Payer: Medicare Other

## 2017-09-02 ENCOUNTER — Inpatient Hospital Stay: Payer: Medicare Other | Attending: Oncology

## 2017-09-02 VITALS — BP 138/77 | HR 78 | Temp 97.5°F | Resp 18

## 2017-09-02 DIAGNOSIS — Z79899 Other long term (current) drug therapy: Secondary | ICD-10-CM | POA: Diagnosis not present

## 2017-09-02 DIAGNOSIS — C50912 Malignant neoplasm of unspecified site of left female breast: Secondary | ICD-10-CM | POA: Insufficient documentation

## 2017-09-02 DIAGNOSIS — Z170 Estrogen receptor positive status [ER+]: Principal | ICD-10-CM

## 2017-09-02 DIAGNOSIS — C7951 Secondary malignant neoplasm of bone: Secondary | ICD-10-CM | POA: Diagnosis not present

## 2017-09-02 LAB — CBC WITH DIFFERENTIAL/PLATELET
Basophils Absolute: 0.1 10*3/uL (ref 0–0.1)
Basophils Relative: 2 %
EOS PCT: 1 %
Eosinophils Absolute: 0 10*3/uL (ref 0–0.7)
HCT: 28.9 % — ABNORMAL LOW (ref 35.0–47.0)
Hemoglobin: 10.2 g/dL — ABNORMAL LOW (ref 12.0–16.0)
LYMPHS ABS: 1 10*3/uL (ref 1.0–3.6)
LYMPHS PCT: 25 %
MCH: 36.3 pg — AB (ref 26.0–34.0)
MCHC: 35.2 g/dL (ref 32.0–36.0)
MCV: 103.1 fL — AB (ref 80.0–100.0)
MONOS PCT: 5 %
Monocytes Absolute: 0.2 10*3/uL (ref 0.2–0.9)
Neutro Abs: 2.6 10*3/uL (ref 1.4–6.5)
Neutrophils Relative %: 67 %
PLATELETS: 299 10*3/uL (ref 150–440)
RBC: 2.8 MIL/uL — ABNORMAL LOW (ref 3.80–5.20)
RDW: 15.6 % — ABNORMAL HIGH (ref 11.5–14.5)
WBC: 3.9 10*3/uL (ref 3.6–11.0)

## 2017-09-02 LAB — COMPREHENSIVE METABOLIC PANEL
ALBUMIN: 4 g/dL (ref 3.5–5.0)
ALT: 17 U/L (ref 14–54)
AST: 27 U/L (ref 15–41)
Alkaline Phosphatase: 62 U/L (ref 38–126)
Anion gap: 11 (ref 5–15)
BUN: 12 mg/dL (ref 6–20)
CHLORIDE: 102 mmol/L (ref 101–111)
CO2: 23 mmol/L (ref 22–32)
CREATININE: 0.92 mg/dL (ref 0.44–1.00)
Calcium: 9.6 mg/dL (ref 8.9–10.3)
GFR calc Af Amer: >60 mL/min (ref >60)
GFR calc non Af Amer: 60 mL/min — ABNORMAL LOW (ref >60)
GLUCOSE: 126 mg/dL — AB (ref 65–99)
POTASSIUM: 4.1 mmol/L (ref 3.5–5.1)
SODIUM: 136 mmol/L (ref 135–145)
Total Bilirubin: 1 mg/dL (ref 0.3–1.2)
Total Protein: 7.2 g/dL (ref 6.5–8.1)

## 2017-09-02 LAB — TSH: TSH: 3.485 u[IU]/mL (ref 0.350–4.500)

## 2017-09-02 MED ORDER — SODIUM CHLORIDE 0.9 % IV SOLN
Freq: Once | INTRAVENOUS | Status: AC
  Administered 2017-09-02: 14:00:00 via INTRAVENOUS
  Filled 2017-09-02: qty 1000

## 2017-09-02 MED ORDER — ZOLEDRONIC ACID 4 MG/100ML IV SOLN
4.0000 mg | Freq: Once | INTRAVENOUS | Status: AC
  Administered 2017-09-02: 4 mg via INTRAVENOUS
  Filled 2017-09-02: qty 100

## 2017-09-02 MED ORDER — HEPARIN SOD (PORK) LOCK FLUSH 100 UNIT/ML IV SOLN
500.0000 [IU] | Freq: Once | INTRAVENOUS | Status: AC | PRN
  Administered 2017-09-02: 500 [IU]
  Filled 2017-09-02: qty 5

## 2017-09-03 LAB — HEMOGLOBIN A1C
Hgb A1c MFr Bld: 5.3 % (ref 4.8–5.6)
MEAN PLASMA GLUCOSE: 105 mg/dL

## 2017-09-03 LAB — CANCER ANTIGEN 27.29: CA 27.29: 73.4 U/mL — ABNORMAL HIGH (ref 0.0–38.6)

## 2017-09-07 ENCOUNTER — Telehealth (INDEPENDENT_AMBULATORY_CARE_PROVIDER_SITE_OTHER): Payer: Self-pay

## 2017-09-07 NOTE — Telephone Encounter (Signed)
Spoke with the patient regarding her call on Friday. She stated that her leg had been swelling since her procedure on Monday and that when it did she would elevate and she wanted to know if anything else could be done. I let her know she could wear compression hose and to also elevate above heart level as needed. She was agreeable to this.

## 2017-09-10 ENCOUNTER — Telehealth: Payer: Self-pay | Admitting: Nurse Practitioner

## 2017-09-10 ENCOUNTER — Other Ambulatory Visit: Payer: Self-pay | Admitting: *Deleted

## 2017-09-10 ENCOUNTER — Other Ambulatory Visit: Payer: Self-pay | Admitting: Oncology

## 2017-09-10 DIAGNOSIS — C50912 Malignant neoplasm of unspecified site of left female breast: Secondary | ICD-10-CM

## 2017-09-10 DIAGNOSIS — Z170 Estrogen receptor positive status [ER+]: Principal | ICD-10-CM

## 2017-09-10 MED ORDER — APIXABAN 5 MG PO TABS: 5.0000 mg | ORAL_TABLET | Freq: Two times a day (BID) | ORAL | 1 refills | Status: DC

## 2017-09-10 NOTE — Telephone Encounter (Signed)
Called patient to discuss refill and notify her that her insurance company had been contacted and they had approved an override so that she could pick up medication and medication would be covered. This was due to her drug store not having starter packs available initially. She thanked for efforts and call to update.

## 2017-09-16 MED FILL — IBRANCE 75 MG CAPSULE: 75 | 28 days supply | Qty: 21 | Fill #0

## 2017-09-30 ENCOUNTER — Inpatient Hospital Stay: Payer: Medicare Other

## 2017-09-30 ENCOUNTER — Encounter: Payer: Self-pay | Admitting: Oncology

## 2017-09-30 ENCOUNTER — Inpatient Hospital Stay: Payer: Medicare Other | Attending: Oncology | Admitting: Oncology

## 2017-09-30 VITALS — BP 145/81 | HR 73 | Temp 97.1°F | Resp 18 | Ht 62.0 in | Wt 192.7 lb

## 2017-09-30 DIAGNOSIS — Z7901 Long term (current) use of anticoagulants: Secondary | ICD-10-CM | POA: Diagnosis not present

## 2017-09-30 DIAGNOSIS — Z170 Estrogen receptor positive status [ER+]: Secondary | ICD-10-CM

## 2017-09-30 DIAGNOSIS — C50912 Malignant neoplasm of unspecified site of left female breast: Secondary | ICD-10-CM

## 2017-09-30 DIAGNOSIS — Z79899 Other long term (current) drug therapy: Secondary | ICD-10-CM

## 2017-09-30 DIAGNOSIS — I82411 Acute embolism and thrombosis of right femoral vein: Secondary | ICD-10-CM | POA: Diagnosis not present

## 2017-09-30 DIAGNOSIS — C7951 Secondary malignant neoplasm of bone: Secondary | ICD-10-CM

## 2017-09-30 DIAGNOSIS — Z7983 Long term (current) use of bisphosphonates: Secondary | ICD-10-CM | POA: Diagnosis not present

## 2017-09-30 DIAGNOSIS — Z79811 Long term (current) use of aromatase inhibitors: Secondary | ICD-10-CM | POA: Insufficient documentation

## 2017-09-30 LAB — CBC WITH DIFFERENTIAL/PLATELET
BASOS ABS: 0.1 10*3/uL (ref 0–0.1)
BASOS PCT: 3 %
EOS PCT: 2 %
Eosinophils Absolute: 0 10*3/uL (ref 0–0.7)
HCT: 34.7 % — ABNORMAL LOW (ref 35.0–47.0)
Hemoglobin: 12.2 g/dL (ref 12.0–16.0)
LYMPHS PCT: 28 %
Lymphs Abs: 0.8 10*3/uL — ABNORMAL LOW (ref 1.0–3.6)
MCH: 36.4 pg — ABNORMAL HIGH (ref 26.0–34.0)
MCHC: 35.3 g/dL (ref 32.0–36.0)
MCV: 103.1 fL — AB (ref 80.0–100.0)
MONO ABS: 0.1 10*3/uL — AB (ref 0.2–0.9)
Monocytes Relative: 5 %
Neutro Abs: 1.7 10*3/uL (ref 1.4–6.5)
Neutrophils Relative %: 62 %
PLATELETS: 309 10*3/uL (ref 150–440)
RBC: 3.36 MIL/uL — AB (ref 3.80–5.20)
RDW: 16.7 % — ABNORMAL HIGH (ref 11.5–14.5)
WBC: 2.8 10*3/uL — AB (ref 3.6–11.0)

## 2017-09-30 LAB — COMPREHENSIVE METABOLIC PANEL
ALT: 24 U/L (ref 14–54)
AST: 29 U/L (ref 15–41)
Albumin: 4.3 g/dL (ref 3.5–5.0)
Alkaline Phosphatase: 62 U/L (ref 38–126)
Anion gap: 10 (ref 5–15)
BILIRUBIN TOTAL: 1 mg/dL (ref 0.3–1.2)
BUN: 11 mg/dL (ref 6–20)
CO2: 24 mmol/L (ref 22–32)
CREATININE: 0.85 mg/dL (ref 0.44–1.00)
Calcium: 10.2 mg/dL (ref 8.9–10.3)
Chloride: 104 mmol/L (ref 101–111)
GFR calc Af Amer: >60 mL/min (ref >60)
Glucose, Bld: 111 mg/dL — ABNORMAL HIGH (ref 65–99)
Potassium: 3.7 mmol/L (ref 3.5–5.1)
Sodium: 138 mmol/L (ref 135–145)
TOTAL PROTEIN: 7.2 g/dL (ref 6.5–8.1)

## 2017-09-30 MED ORDER — SODIUM CHLORIDE 0.9% FLUSH
10.0000 mL | Freq: Once | INTRAVENOUS | Status: AC
  Administered 2017-09-30: 10 mL via INTRAVENOUS
  Filled 2017-09-30: qty 10

## 2017-09-30 MED ORDER — HEPARIN SOD (PORK) LOCK FLUSH 100 UNIT/ML IV SOLN
500.0000 [IU] | Freq: Once | INTRAVENOUS | Status: AC
  Administered 2017-09-30: 500 [IU] via INTRAVENOUS
  Filled 2017-09-30: qty 5

## 2017-09-30 MED ORDER — SODIUM CHLORIDE 0.9 % IV SOLN
Freq: Once | INTRAVENOUS | Status: AC
  Administered 2017-09-30: 15:00:00 via INTRAVENOUS
  Filled 2017-09-30: qty 1000

## 2017-09-30 MED ORDER — ZOLEDRONIC ACID 4 MG/100ML IV SOLN
4.0000 mg | Freq: Once | INTRAVENOUS | Status: AC
  Administered 2017-09-30: 4 mg via INTRAVENOUS
  Filled 2017-09-30: qty 100

## 2017-09-30 NOTE — Progress Notes (Signed)
No new changes noted today 

## 2017-10-01 LAB — CANCER ANTIGEN 27.29: CA 27.29: 90.8 U/mL — ABNORMAL HIGH (ref 0.0–38.6)

## 2017-10-01 NOTE — Progress Notes (Signed)
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 Patient is a 77 year old female with no significant comorbidities who noticed left breast mass about 6-9 months ago. She thought it would go awaybut it continued to increase in size and began to involve her skin. She also noted tenderness to palpation as well as intermittent sharp tingling pain. She was seen by Dr. Adora Fridge on 08/25/2016 and patient had a biopsy of her skin in his office which showed:DIAGNOSIS:  A. LEFT BREAST SKIN; EXCISION:  - INVASIVE CARCINOMA MORPHOLOGICALLY CONSISTENT WITH MAMMARY ORIGIN  INVOLVING THE DERMIS.   BREAST BIOMARKER TESTS  Estrogen Receptor (ER) Status: POSITIVE, >90%  Progesterone Receptor (PgR) Status: POSITIVE, >70%  Her2 negative  2. Patient underwent bilateral diagnostic mammogram on 08/31/2016 showed:IMPRESSION: 1. Known left breast cancer, retroareolar with probable extension to the nipple, measuring 4.3 cm greatest dimension by ultrasound, with associated architectural distortion and associated left nipple retraction, and with associated diffuse skin thickening on the left. 2. No enlarged or morphologically abnormal lymph nodes are seen in the left axilla by ultrasound. 3. No evidence of malignancy within the right breast.  3. Patient was also complaining of some perirectal pain and underwent CT abdomen pelvis with contrast on 09/02/2016 which showed: IMPRESSION: Left colonic diverticulosis. No active diverticulitis.Fatty liver. No acute findings or evidence of  metastatic disease in the abdomen or pelvis. Diffuse bony sclerotic metastases.  4. Other than that her breast pain patient overall feels well. Denies any unintentional weight loss. She continues to be active and care for her great-grandchildren. She has had a hysterectomy in the past. No family history of breast or ovarian cancer.  5. Given that she had inflammatory breast cancer- plan was to give 4 cycles of dose dense AC followed by possible mastectomy and treat bone mets with AI + ibrance  6. PET CT scan on 09/11/16 showed: IMPRESSION: 1. Low-grade but abnormal hypermetabolic activity associated with the cutaneous and sub areolar glandular tissues of the left breast, compatible with malignancy, representative SUV 3.9 compared to contralateral normal side 1.4. 2. Diffuse sclerotic osseous metastatic disease with low-grade hypermetabolic activity, a representative SUV along the left sacrum 5.4. 3. Other imaging findings of potential clinical significance: Aortic Atherosclerosis (ICD10-I70.0). Descending and sigmoid colon Diverticulosis.  7. Baseline MUGA scan showed EF of 71%. Baseline CA 27.29 elevated at 563.2  8. 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  9. Letrozole and ibrance started in sept 2018. zometa monthly also started. Baseline bone density scan normal  10. ibrance held in oct 2018 after cycle 1 due to Grade 3 neutropenia and thrombocytopenia.cycle 2 restaretd on 02/04/17 at 100 mg. Patient continued to have prolongeed grade 3 neutropenia and dose lowered to 75 mg  11. Patient had extensive RLE DVT in April 2019 and is currently on eliquis. She also underwent venous thrombectomy and thrombolytic therapy and IVC placement by Dr. Lucky Cowboy   Interval history- right leg swelling is significantly improved over last 1 month. She is taking her eliquis regularly. She is also complaint with ibrance and letrozole. She reports fatigue due to  the medication but  deneis other complaints. She is independent of her ADL's and IADL's  ECOG PS- 1 Pain scale- 0   Review of systems- Review of Systems  Constitutional: Positive for malaise/fatigue. Negative for chills, fever and weight loss.  HENT: Negative for congestion, ear discharge and nosebleeds.   Eyes: Negative for blurred vision.  Respiratory: Negative for cough, hemoptysis, sputum production, shortness of breath and wheezing.   Cardiovascular: Negative for chest pain, palpitations, orthopnea and claudication.  Gastrointestinal: Negative for abdominal pain, blood in stool, constipation, diarrhea, heartburn, melena, nausea and vomiting.  Genitourinary: Negative for dysuria, flank pain, frequency, hematuria and urgency.  Musculoskeletal: Negative for back pain, joint pain and myalgias.  Skin: Negative for rash.  Neurological: Negative for dizziness, tingling, focal weakness, seizures, weakness and headaches.  Endo/Heme/Allergies: Does not bruise/bleed easily.  Psychiatric/Behavioral: Negative for depression and suicidal ideas. The patient does not have insomnia.       No Known Allergies   Past Medical History:  Diagnosis Date  . Breast cancer ([place])   . Cancer ([place])    left breast  . DVT (deep venous thrombosis) ([place])    2019  . Hypertension   . Port catheter in place 09/14/2016   Placed 09/10/2016 RT chest.     Past Surgical History:  Procedure Laterality Date  . ABDOMINAL HYSTERECTOMY    . APPENDECTOMY    . BREAST BIOPSY    . CYST EXCISION Right 09/2016   Buttocks  . PERIPHERAL VASCULAR THROMBECTOMY Right 08/30/2017   Procedure: PERIPHERAL VASCULAR THROMBECTOMY;  Surgeon: Algernon Huxley, MD;  Location: [place] [facility];  Service: Cardiovascular;  Laterality: Right;  . PORTACATH PLACEMENT Right 09/10/2016   Procedure: INSERTION PORT-A-CATH;  Surgeon: Jules Husbands, MD;  Location: [facility];  Service: General;  Laterality: Right;    Social History   Socioeconomic History  .  Marital status: Married    Spouse name: Jeneen Rinks  . Number of children: 2  . Years of education: 6  . Highest education level: Bachelor's degree (e.g., BA, AB, BS)  Occupational History  . 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[employer]  Social Needs  . Financial resource strain: 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  . Food insecurity:    Worry: Never true    Inability: Never true  . Transportation needs:    Medical: No    Non-medical: No  Tobacco Use  . Smoking status: Never Smoker  . Smokeless tobacco: Never Used  Substance and Sexual Activity  . Alcohol use: No  . Drug use: No  . Sexual activity: Never  Lifestyle  . Physical activity:    Days per week: Patient refused    Minutes per session: Patient refused  . Stress: Not at all  Relationships  . Social connections:    Talks on phone: More than three times a week    Gets together: Once a week    Attends religious service: More than 4 times per year    Active member of club or organization: No    Attends meetings of clubs or organizations: Never    Relationship status: Not on file  . Intimate partner violence:    Fear of current or ex partner: Not on file    Emotionally abused: Not on file    Physically abused: Not on file    Forced sexual activity: Not on file  Other Topics Concern  . Not on file  Social History Narrative  . Not  on file    Family History  Problem Relation Age of Onset  . Parkinson's disease Mother   . Bladder Cancer Father   . Lung cancer Father      Current Outpatient Medications:  .  amLODipine (NORVASC) 10 MG tablet, Take 10 mg by mouth daily., Disp: , Rfl:  .  apixaban (ELIQUIS) 5 MG TABS tablet, Take 1 tablet (5 mg total) by mouth 2 (two) times daily., Disp: 60 tablet, Rfl: 1 .  Calcium Carb-Cholecalciferol (CALCIUM 600/VITAMIN D3 PO), Take 1 tablet by mouth daily., Disp: , Rfl:  .  hydrochlorothiazide (MICROZIDE) 12.5 MG capsule, Take 12.5 mg by mouth daily. , Disp: , Rfl:  .  IBRANCE 75  MG capsule, TAKE 1 CAPSULE (75 MG TOTAL) BY MOUTH DAILY WITH BREAKFAST. TAKE FOR 21 DAYS ON, THEN 7 DAYS OFF., Disp: 21 capsule, Rfl: 3 .  letrozole (FEMARA) 2.5 MG tablet, Take 1 tablet (2.5 mg total) by mouth daily., Disp: 90 tablet, Rfl: 3 .  Vitamin D, Cholecalciferol, 400 units CAPS, Take 400 Units by mouth daily. , Disp: , Rfl:  .  acetaminophen (TYLENOL) 500 MG tablet, Take 1,000 mg by mouth every 6 (six) hours as needed for moderate pain., Disp: , Rfl:  .  dexamethasone (DECADRON) 4 MG tablet, Take 2 tablets by mouth once a day on the day after chemotherapy and then take 2 tablets two times a day for 2 days. Take with food. (Patient not taking: Reported on 08/27/2017), Disp: 30 tablet, Rfl: 1 .  diphenoxylate-atropine (LOMOTIL) 2.5-0.025 MG tablet, Take 1 tablet by mouth 4 (four) times daily as needed for diarrhea or loose stools. (Patient not taking: Reported on 08/27/2017), Disp: 30 tablet, Rfl: 0 .  lidocaine-prilocaine (EMLA) cream, Apply to affected area once (Patient not taking: Reported on 09/30/2017), Disp: 30 g, Rfl: 3 .  LORazepam (ATIVAN) 0.5 MG tablet, Take 1 tablet (0.5 mg total) by mouth every 6 (six) hours as needed (Nausea or vomiting). (Patient not taking: Reported on 08/27/2017), Disp: 30 tablet, Rfl: 0 .  ondansetron (ZOFRAN) 8 MG tablet, Take 1 tablet (8 mg total) by mouth 2 (two) times daily as needed. Start on the third day after chemotherapy. (Patient not taking: Reported on 08/27/2017), Disp: 30 tablet, Rfl: 1 .  PREVIDENT 5000 BOOSTER PLUS 1.1 % PSTE, , Disp: , Rfl:  .  prochlorperazine (COMPAZINE) 10 MG tablet, Take 1 tablet (10 mg total) by mouth every 6 (six) hours as needed (Nausea or vomiting). (Patient not taking: Reported on 09/30/2017), Disp: 30 tablet, Rfl: 1  Physical exam:  Vitals:   09/30/17 1359  BP: (!) 145/81  Pulse: 73  Resp: 18  Temp: (!) 97.1 F (36.2 C)  TempSrc: Tympanic  SpO2: 99%  Weight: 192 lb 11.2 oz (87.4 kg)  Height: _0  (1.575 m)    Physical Exam  Constitutional: She is oriented to person, place, and time. She appears well-developed and well-nourished.  HENT:  Head: Normocephalic and atraumatic.  Eyes: Pupils are equal, round, and reactive to light. EOM are normal.  Neck: Normal range of motion.  Cardiovascular: Normal rate, regular rhythm and normal heart sounds.  Pulmonary/Chest: Effort normal and breath sounds normal.  Abdominal: Soft. Bowel sounds are normal.  Musculoskeletal:  RLE appears more swollen than left  Neurological: She is alert and oriented to person, place, and time.  Skin: Skin is warm and dry.   left breast mass is vaguely palpable. Induration around the skin has decreased significantly  CMP Latest Ref Rng & Units 09/30/2017  Glucose 65 - 99 mg/dL 111(H)  BUN 6 - 20 mg/dL 11  Creatinine 0.44 - 1.00 mg/dL 0.85  Sodium 135 - 145 mmol/L 138  Potassium 3.5 - 5.1 mmol/L 3.7  Chloride 101 - 111 mmol/L 104  CO2 22 - 32 mmol/L 24  Calcium 8.9 - 10.3 mg/dL 10.2  Total Protein 6.5 - 8.1 g/dL 7.2  Total Bilirubin 0.3 - 1.2 mg/dL 1.0  Alkaline Phos 38 - 126 U/L 62  AST 15 - 41 U/L 29  ALT 14 - 54 U/L 24   CBC Latest Ref Rng & Units 09/30/2017  WBC 3.6 - 11.0 K/uL 2.8(L)  Hemoglobin 12.0 - 16.0 g/dL 12.2  Hematocrit 35.0 - 47.0 % 34.7(L)  Platelets 150 - 440 K/uL 309    Assessment and plan- Patient is a 77 y.o. female Stage IV ER PR positive her 2 negative breast cancer with bone only mets here for routine f/u visit for following issues:  1. Metastatic breast cancer with bone mets; continue ibrance and letrozole. Tumor markers are trending down. We will plan to repeat her scans in 2 months time. I will see her back in 4 weeks with cbc cmp and ca 27.29  2. Bone mets- she gets monthly zoemta and will get 1 dose today  3. RLE extensive DVT: s/p thrombectomy and IVC placement. Appreciate input from Dr. Lucky Cowboy. She will need IVC filter out in a couple of months. Continue eliquis indefinitely as it is  likely related to her malignancy     Visit Diagnosis 1. Malignant neoplasm of left breast in female, estrogen receptor positive, unspecified site of breast ([place])   2. Bone metastases ([place])   3. High risk medication use   4. Long term (current) use of bisphosphonates   5. DVT of deep femoral vein, right ([place])      Dr. Randa Evens, MD, MPH [facility] [phone] 10/01/2017 8:03 AM

## 2017-10-11 ENCOUNTER — Other Ambulatory Visit: Payer: Self-pay

## 2017-10-11 ENCOUNTER — Telehealth: Payer: Self-pay

## 2017-10-11 MED ORDER — APIXABAN 5 MG PO TABS: 5.0000 mg | ORAL_TABLET | Freq: Two times a day (BID) | ORAL | 1 refills | Status: DC

## 2017-10-11 NOTE — Telephone Encounter (Signed)
Per the patient request / refill needed for Eliquis 5 mg BID. Per Dr. Janese Banks refill has been E-perscribed to [facility] [address]. The patient was understanding and agreeable to pick up medication from [place],

## 2017-10-12 MED FILL — IBRANCE 75 MG CAPSULE: 75 | 28 days supply | Qty: 21 | Fill #1

## 2017-10-17 IMAGING — CR DG CHEST 2V
2 series · 2 of 2 positions shown · non-contrast
Comparison: September 10, 2016

CLINICAL DATA: Metastatic breast cancer with chemotherapy and
fever.

EXAM:
CHEST  2 VIEW

[chest lat]
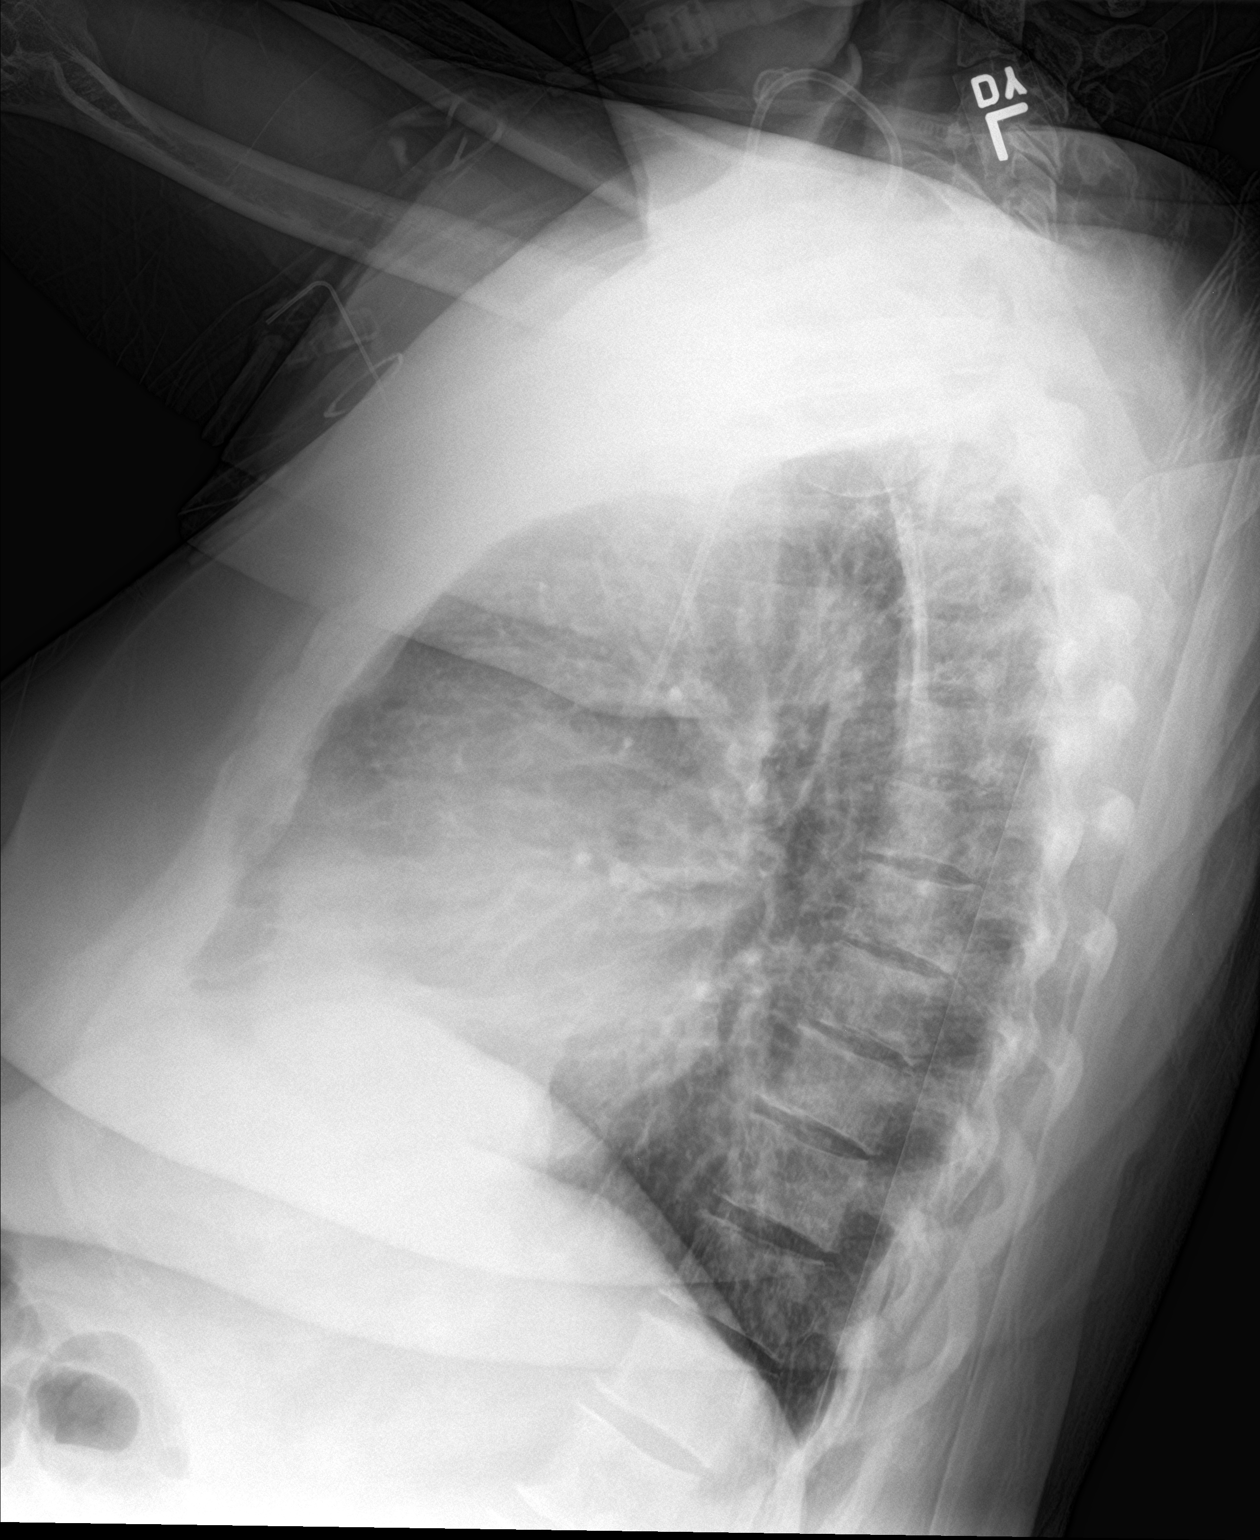

[chest ap]
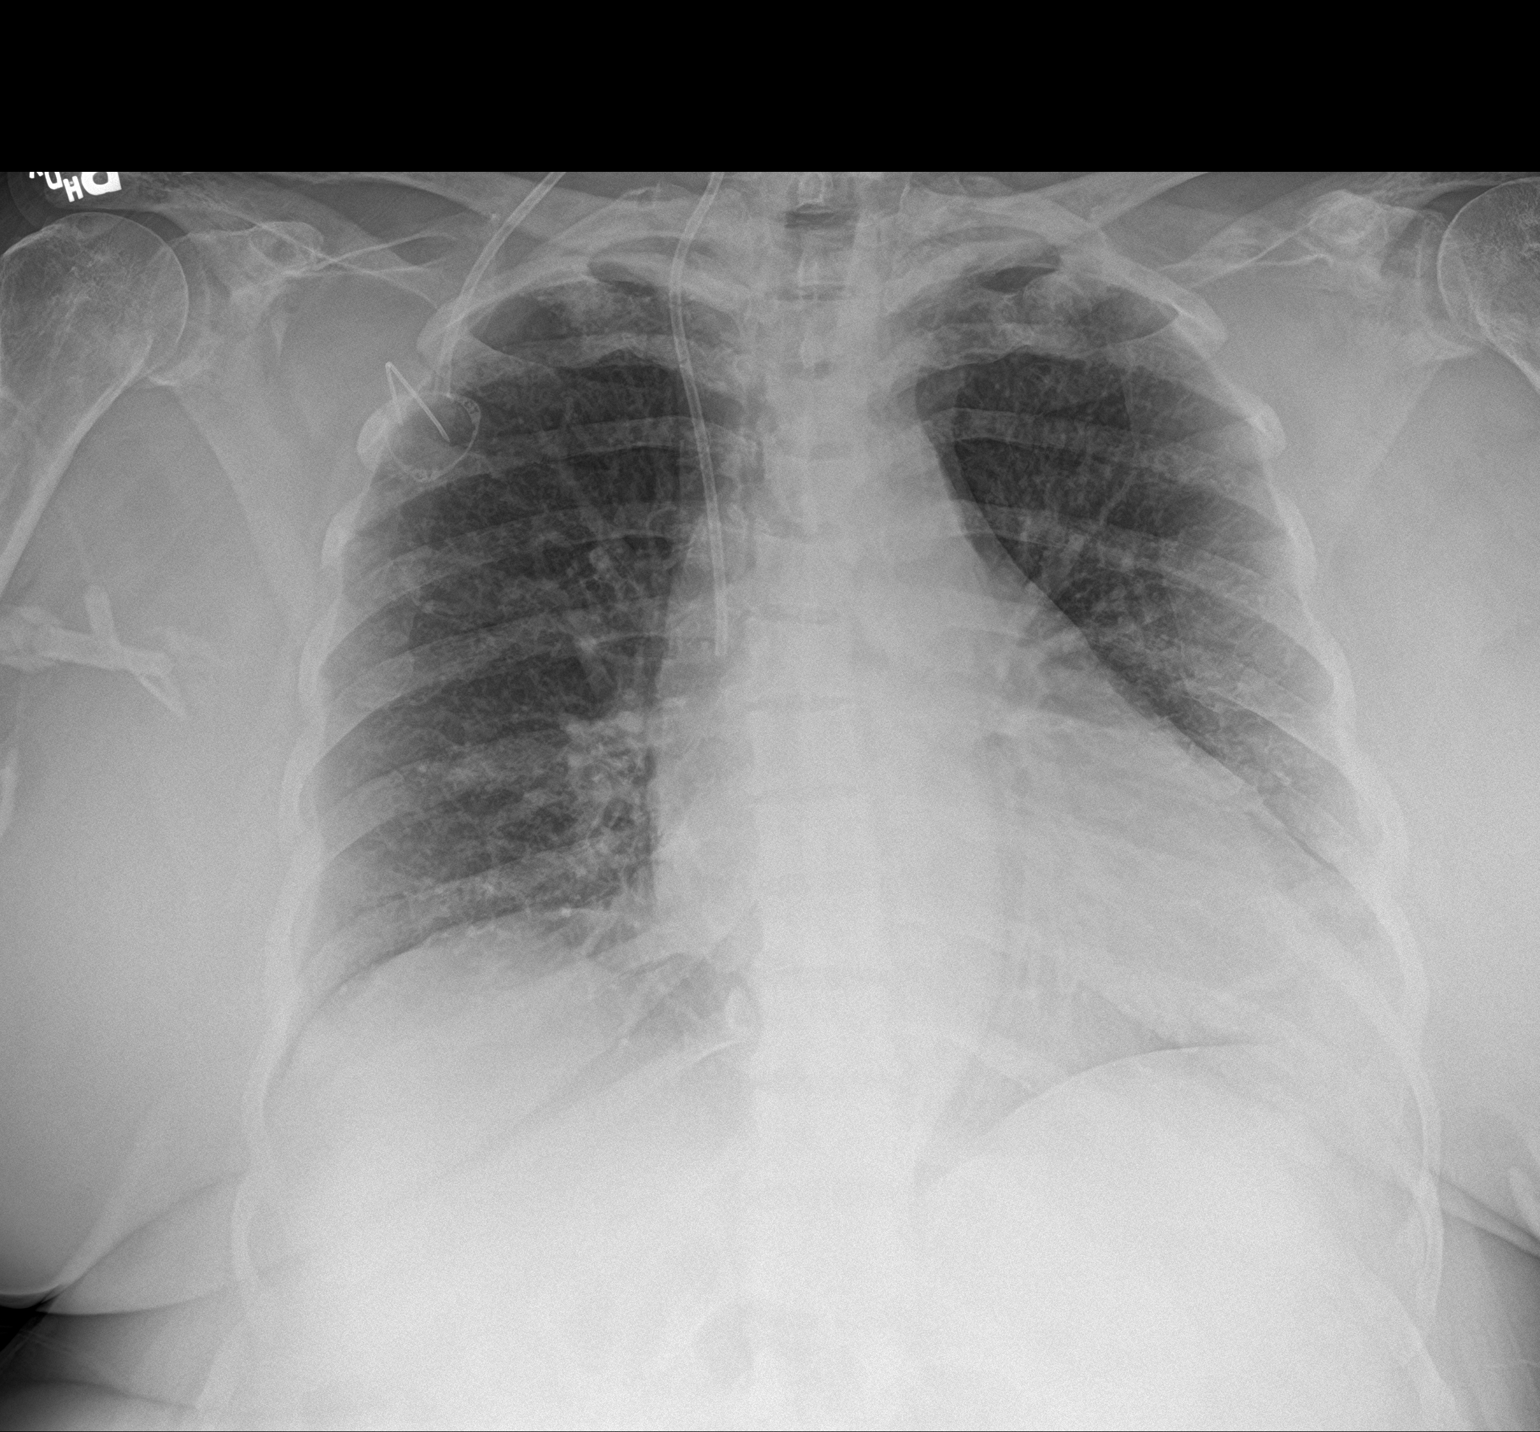

[2 of 2 positions shown; findings below may reference images not displayed]

FINDINGS: A right Port-A-Cath is in stable position. No pneumothorax.
Cardiomegaly persists. The hila and mediastinum are normal. No
pulmonary nodules or masses are seen. No focal infiltrates. No
change in the bones.
IMPRESSION: No cause for fever identified.

## 2017-10-18 IMAGING — CR DG CHEST 2V
1 series · 2 of 2 positions shown · non-contrast
Comparison: 10/09/2016.  09/10/2016.  08/28/2016.

CLINICAL DATA: Metastatic breast cancer.  Neutropenia.  Fever.

EXAM:
CHEST  2 VIEW

[Series 1: dg chest 2 view · 0.14mm/px · 2 of 2 slices shown]
[im 1/2]
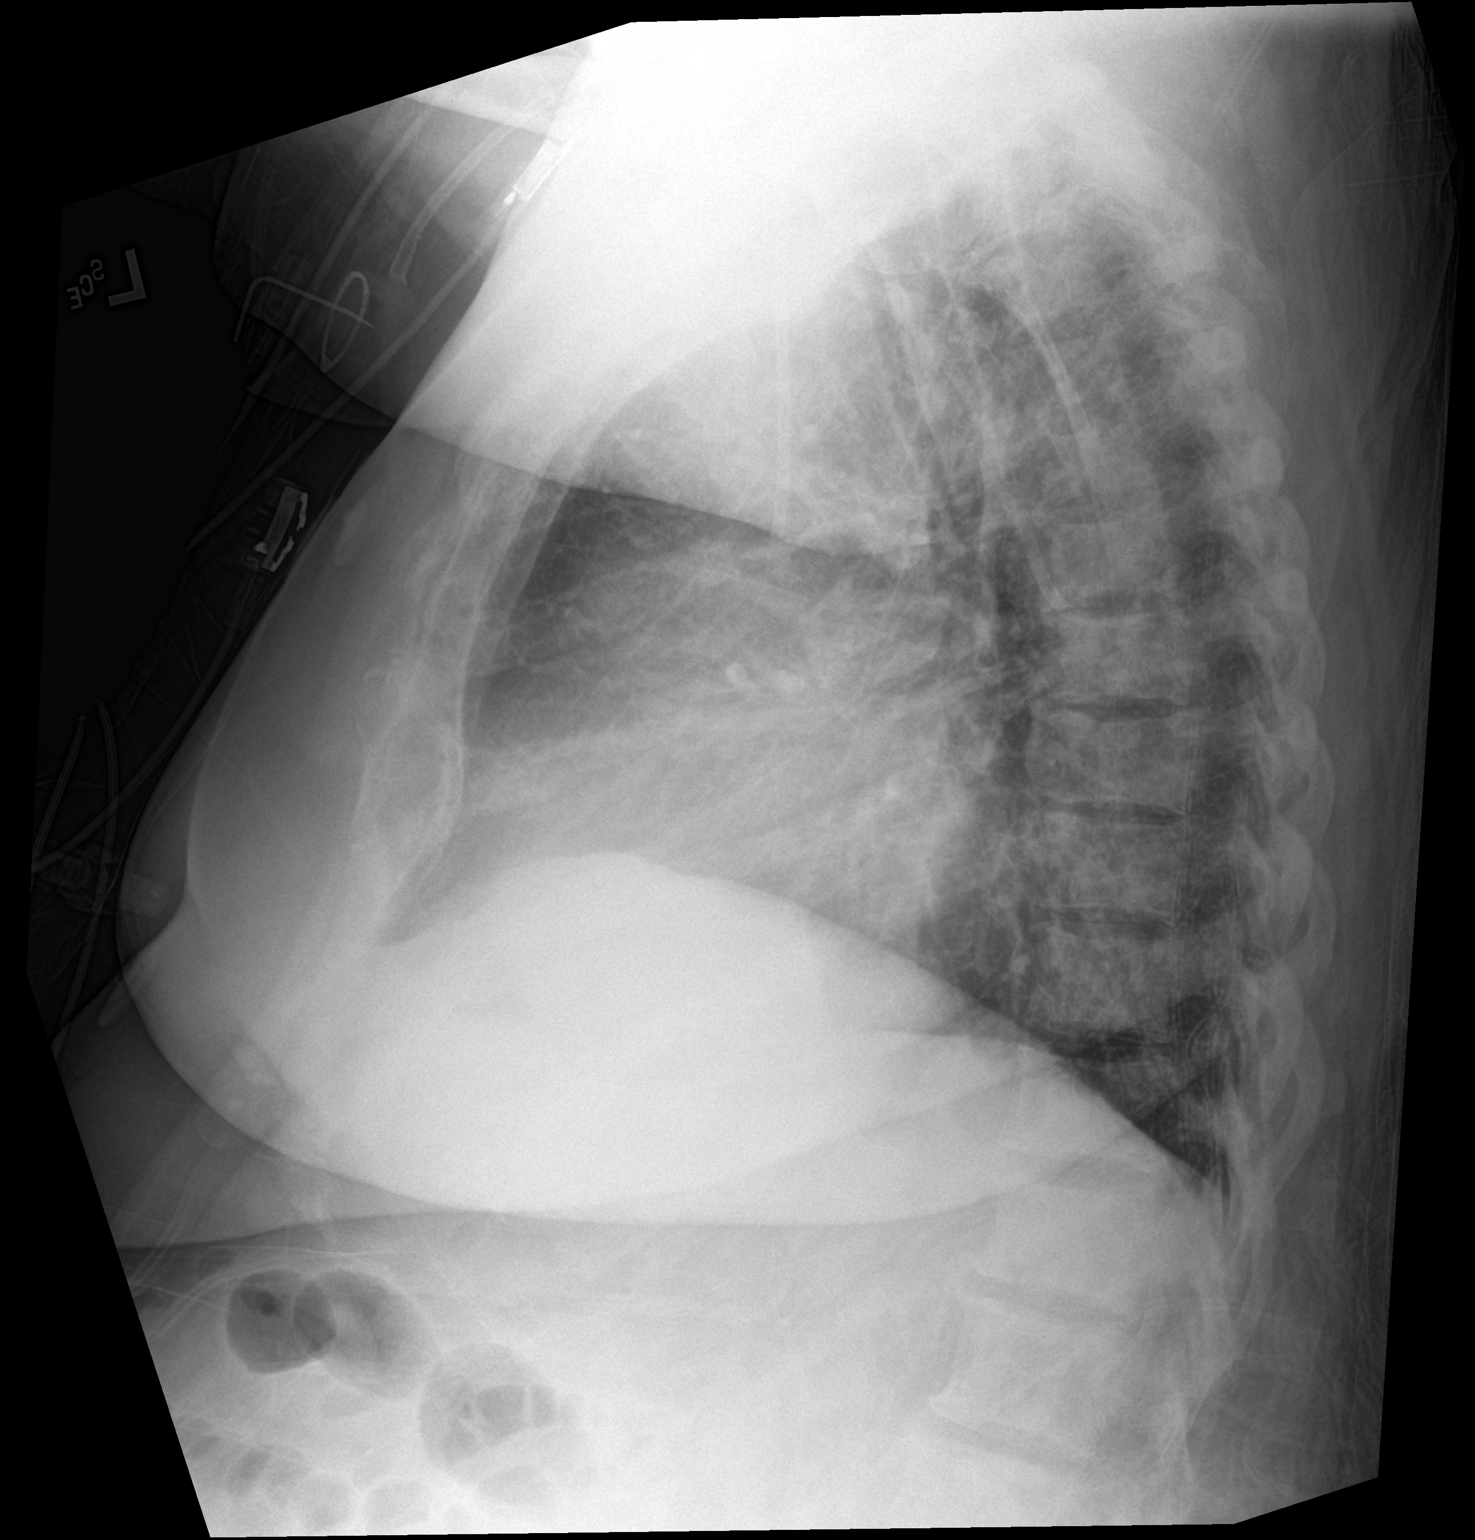
[im 2/2]
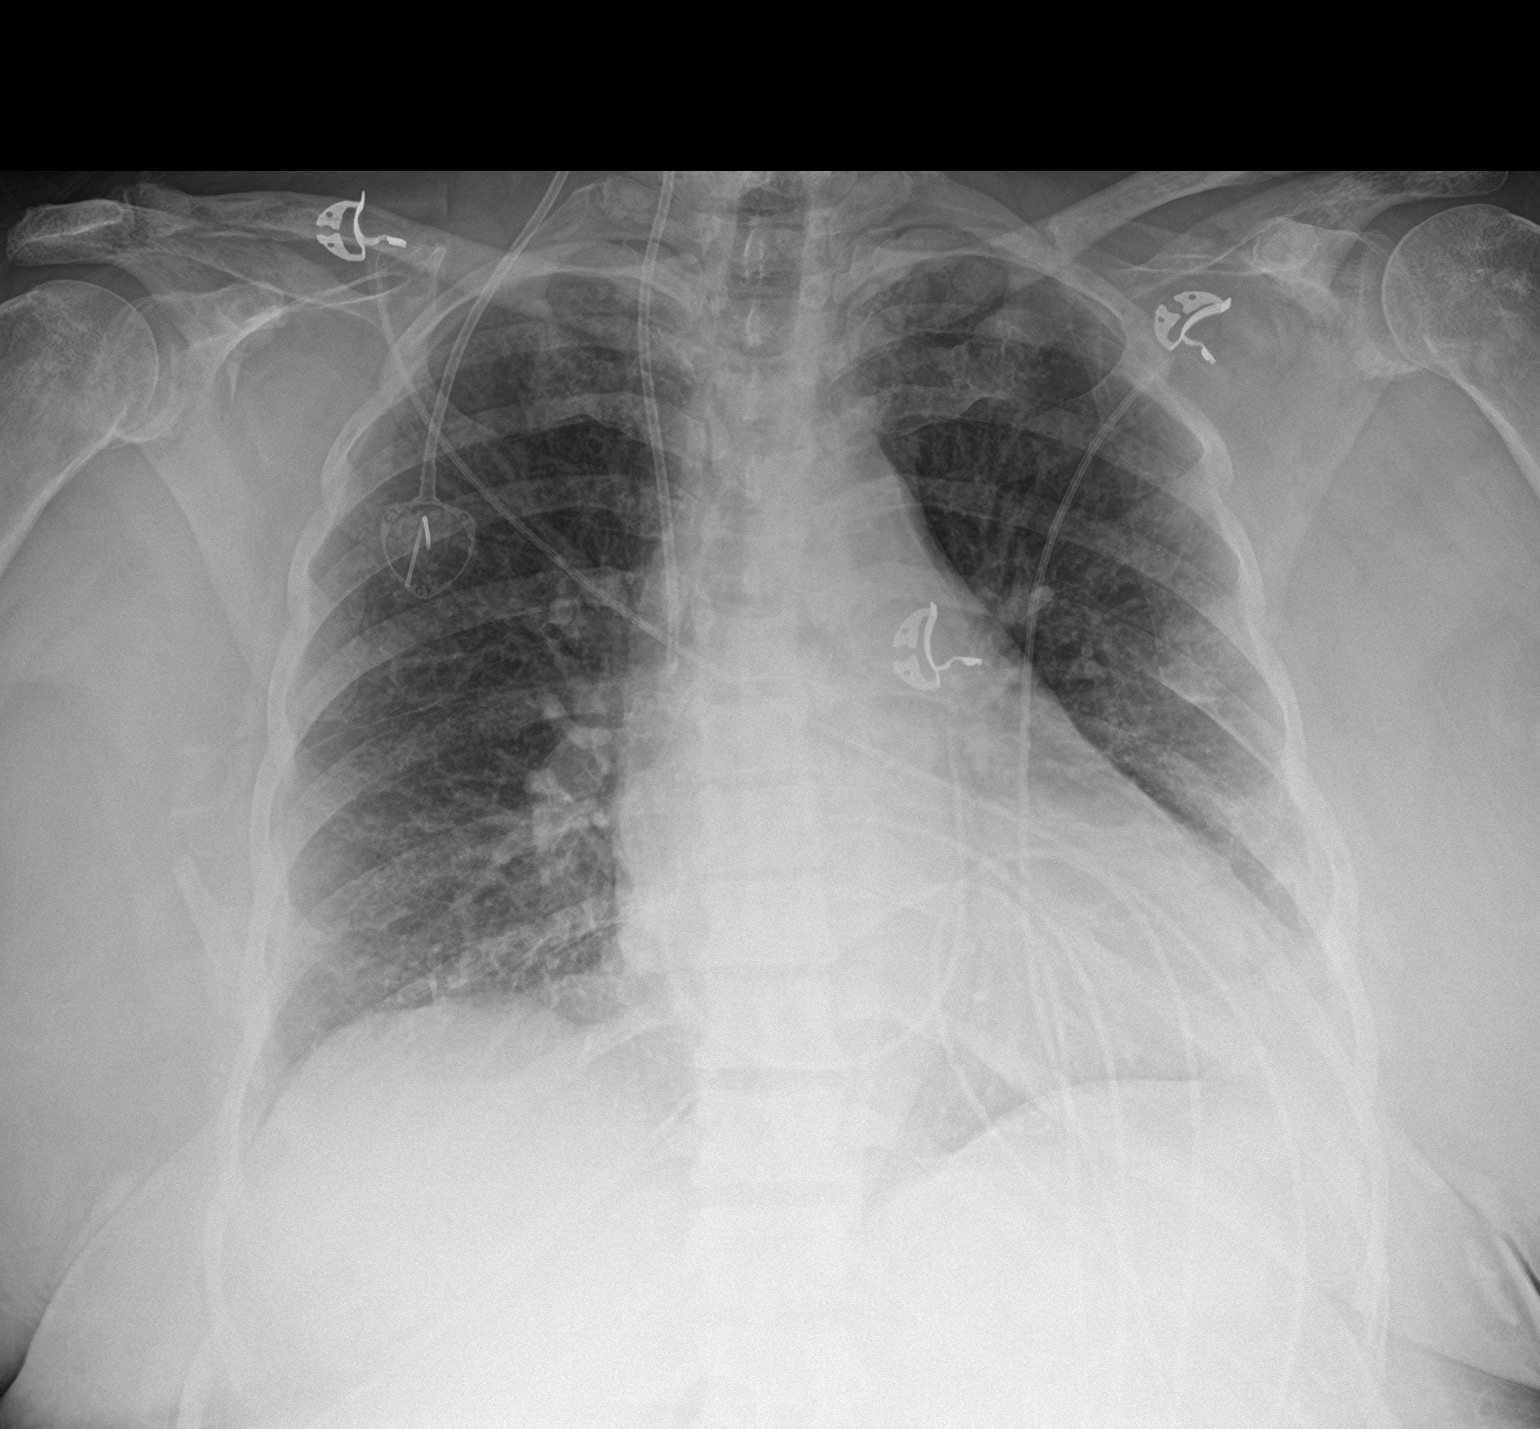

[2 of 2 positions shown; findings below may reference images not displayed]

FINDINGS: Artifact overlies chest. Heart size is normal. Power port on the
right inserted from an internal jugular approach has its tip in the
SVC 3 cm above the right atrium. The right lung is clear. There is
abnormal density at the left cardiophrenic angle. This could be due
to volume loss or pneumonia. In this setting, lingular pneumonia is
the concern.
IMPRESSION: Worsening density at the left cardiophrenic angle worrisome for
lingular pneumonia.

## 2017-10-19 ENCOUNTER — Other Ambulatory Visit (INDEPENDENT_AMBULATORY_CARE_PROVIDER_SITE_OTHER): Payer: Self-pay | Admitting: Vascular Surgery

## 2017-10-19 DIAGNOSIS — I82401 Acute embolism and thrombosis of unspecified deep veins of right lower extremity: Secondary | ICD-10-CM

## 2017-10-19 DIAGNOSIS — Z95828 Presence of other vascular implants and grafts: Secondary | ICD-10-CM

## 2017-10-20 ENCOUNTER — Ambulatory Visit (INDEPENDENT_AMBULATORY_CARE_PROVIDER_SITE_OTHER): Payer: Medicare Other

## 2017-10-20 ENCOUNTER — Encounter (INDEPENDENT_AMBULATORY_CARE_PROVIDER_SITE_OTHER): Payer: Self-pay | Admitting: Vascular Surgery

## 2017-10-20 ENCOUNTER — Ambulatory Visit (INDEPENDENT_AMBULATORY_CARE_PROVIDER_SITE_OTHER): Payer: Medicare Other | Admitting: Vascular Surgery

## 2017-10-20 VITALS — BP 174/86 | HR 74 | Resp 15 | Ht 62.0 in | Wt 196.0 lb

## 2017-10-20 DIAGNOSIS — Z95828 Presence of other vascular implants and grafts: Secondary | ICD-10-CM

## 2017-10-20 DIAGNOSIS — I82401 Acute embolism and thrombosis of unspecified deep veins of right lower extremity: Secondary | ICD-10-CM

## 2017-10-20 DIAGNOSIS — I82411 Acute embolism and thrombosis of right femoral vein: Secondary | ICD-10-CM

## 2017-10-20 NOTE — Progress Notes (Signed)
Subjective:    Patient ID: Victoria Steele, female    DOB: [birth year], 77 y.o.   MRN: [id number] Chief Complaint  Patient presents with  . Follow-up    4 week [facility] LE DVT   Patient presents for her first post procedure follow-up.  The patient is status post a right lower extremity DVT venous lysis on Aug 30, 2017 with IVC filter placement.  The patient's post procedure course has been uneventful.  The patient still experiences some swelling to the right lower extremity after sitting or standing for long periods of time.  At this time, the patient is not engaging conservative therapy including wearing medical grade 1 compression socks.  The patient does elevate her leg.  The patient denies any shortness of breath or chest pain.  The patient denies any claudication-like symptoms, rest pain or ulcer formation to the bilateral lateral lower extremity the patient continues to take Eliquis twice daily on a daily basis.  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  When compared to the previous duplex in May 2019 there has been improvement.  The patient denies any issues with her Port-A-Cath.  The patient denies any fever, nausea or vomiting.  Review of Systems  Constitutional: Negative.   HENT: Negative.   Eyes: Negative.   Respiratory: Negative.   Cardiovascular: Positive for leg swelling.       DVT  Gastrointestinal: Negative.   Endocrine: Negative.   Genitourinary: Negative.   Musculoskeletal: Negative.   Skin: Negative.   Allergic/Immunologic: Negative.   Neurological: Negative.   Hematological: Negative.   Psychiatric/Behavioral: Negative.       Objective:   Physical Exam  Constitutional: She is oriented to person, place, and time. She appears well-developed and well-nourished. No distress.  HENT:  Head: Normocephalic and atraumatic.  Right  Ear: External ear normal.  Left Ear: External ear normal.  Eyes: Pupils are equal, round, and reactive to light. Conjunctivae and EOM are normal.  Neck: Normal range of motion.  Cardiovascular: Normal rate, regular rhythm, normal heart sounds and intact distal pulses.  Pulses:      Radial pulses are 2+ on the right side, and 2+ on the left side.       Dorsalis pedis pulses are 2+ on the right side, and 2+ on the left side.       Posterior tibial pulses are 2+ on the right side, and 2+ on the left side.  Pulmonary/Chest: Effort normal and breath sounds normal.  Musculoskeletal: Normal range of motion. She exhibits edema (Mild right nonpitting edema).  Neurological: She is alert and oriented to person, place, and time.  Skin: Skin is warm and dry. She is not diaphoretic.  There is no cellulitis.  There is no active ulcerations noted to the right lower extremity.  Psychiatric: She has a normal mood and affect. Her behavior is normal. Judgment and thought content normal.  Vitals reviewed.  BP (!) 174/86 (BP Location: Right Arm, Patient Position: Sitting)   Pulse 74   Resp 15   Ht 5\' 2"  (1.575 m)   Wt 196 lb (88.9 kg)   BMI 35.85 kg/m   Past Medical History:  Diagnosis Date  . Breast cancer ([place])   . Cancer ([place])    left breast  . DVT (deep venous thrombosis) ([place])    2019  . Hypertension   .  Port catheter in place 09/14/2016   Placed 09/10/2016 RT chest.   Social History   Socioeconomic History  . Marital status: Married    Spouse name: Jeneen Rinks  . Number of children: 2  . Years of education: 6  . Highest education level: Bachelor's degree (e.g., BA, AB, BS)  Occupational History  . 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 Financial resource strain: 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  . Food insecurity:    Worry: Never true    Inability: Never true  . Transportation needs:    Medical: No    Non-medical: No  Tobacco Use  . Smoking status: Never  Smoker  . Smokeless tobacco: Never Used  Substance and Sexual Activity  . Alcohol use: No  . Drug use: No  . Sexual activity: Never  Lifestyle  . Physical activity:    Days per week: Patient refused    Minutes per session: Patient refused  . Stress: Not at all  Relationships  . Social connections:    Talks on phone: More than three times a week    Gets together: Once a week    Attends religious service: More than 4 times per year    Active member of club or organization: No    Attends meetings of clubs or organizations: Never    Relationship status: Not on file  . Intimate partner violence:    Fear of current or ex partner: Not on file    Emotionally abused: Not on file    Physically abused: Not on file    Forced sexual activity: Not on file  Other Topics Concern  . Not on file  Social History Narrative  . Not on file   Past Surgical History:  Procedure Laterality Date  . ABDOMINAL HYSTERECTOMY    . APPENDECTOMY    . BREAST BIOPSY    . CYST EXCISION Right 09/2016   Buttocks  . PERIPHERAL VASCULAR THROMBECTOMY Right 08/30/2017   Procedure: PERIPHERAL VASCULAR THROMBECTOMY;  Surgeon: Algernon Huxley, MD;  Location: [place] [facility];  Service: Cardiovascular;  Laterality: Right;  . PORTACATH PLACEMENT Right 09/10/2016   Procedure: INSERTION PORT-A-CATH;  Surgeon: Jules Husbands, MD;  Location: [facility] ORS;  Service: General;  Laterality: Right;   Family History  Problem Relation Age of Onset  . Parkinson's disease Mother   . Bladder Cancer Father   . Lung cancer Father    No Known Allergies     Assessment & Plan:  Patient presents for her first post procedure follow-up.  The patient is status post a right lower extremity DVT venous lysis on Aug 30, 2017 with IVC filter placement.  The patient's post procedure course has been uneventful.  The patient still experiences some swelling to the right lower extremity after sitting or standing for long periods of time.  At this  time, the patient is not engaging conservative therapy including wearing medical grade 1 compression socks.  The patient does elevate her leg.  The patient denies any shortness of breath or chest pain.  The patient denies any claudication-like symptoms, rest pain or ulcer formation to the bilateral lateral lower extremity the patient continues to take Eliquis twice daily on a daily basis.  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  When compared to the previous duplex in  May 2019 there has been improvement.  The patient denies any issues with her Port-A-Cath.  The patient denies any fever, nausea or vomiting.  1. 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 The patient's IVC filter has been in place for approximately 4 weeks. I had a long discussion with the patient explaining that the IVC filters are removed at 6 to 8 weeks. We will plan on removing the patient's IVC filter.  Procedure, risks and benefits explained to the patient All questions answered Patient wishes to proceed Patient should return to the office in 3 months and undergo a right lower extremity venous duplex to assess the status of her DVT Continue taking Eliquis for at least 6 months  - VAS [place] LOWER EXTREMITY VENOUS (DVT); Future  2. Port-A-Cath in place - Stable At this time, there is no issues with the patient's Port-A-Cath of the skin surrounding it.  Current Outpatient Medications on File Prior to Visit  Medication Sig Dispense Refill  . acetaminophen (TYLENOL) 500 MG  tablet Take 1,000 mg by mouth every 6 (six) hours as needed for moderate pain.    Marland Kitchen amLODipine (NORVASC) 10 MG tablet Take 10 mg by mouth daily.    Marland Kitchen apixaban (ELIQUIS) 5 MG TABS tablet Take 1 tablet (5 mg total) by mouth 2 (two) times daily. 60 tablet 1  . Calcium Carb-Cholecalciferol (CALCIUM 600/VITAMIN D3 PO) Take 1 tablet by mouth daily.    . hydrochlorothiazide (MICROZIDE) 12.5 MG capsule Take 12.5 mg by mouth daily.     Leslee Home 75 MG capsule TAKE 1 CAPSULE (75 MG TOTAL) BY MOUTH DAILY WITH BREAKFAST. TAKE FOR 21 DAYS ON, THEN 7 DAYS OFF. 21 capsule 3  . letrozole (FEMARA) 2.5 MG tablet Take 1 tablet (2.5 mg total) by mouth daily. 90 tablet 3  . PREVIDENT 5000 BOOSTER PLUS 1.1 % PSTE     . prochlorperazine (COMPAZINE) 10 MG tablet Take 1 tablet (10 mg total) by mouth every 6 (six) hours as needed (Nausea or vomiting). 30 tablet 1  . Vitamin D, Cholecalciferol, 400 units CAPS Take 400 Units by mouth daily.      No current facility-administered medications on file prior to visit.    There are no Patient Instructions on file for this visit. Return in about 3 months (around 01/20/2018), or if symptoms worsen or fail to improve.  KIMBERLY A STEGMAYER, PA-C

## 2017-10-23 IMAGING — CR DG CHEST 2V
1 series · 2 of 2 positions shown · non-contrast
Comparison: Prior chest x-ray 10/10/2016

CLINICAL DATA: 75-year-old female with a history of breast cancer
and planned surgical excision of a bile duct mass. Preoperative
chest x-ray.

EXAM:
CHEST  2 VIEW

[Series 1: dg chest 2 view · 0.14mm/px · 2 of 2 slices shown]
[im 1/2]
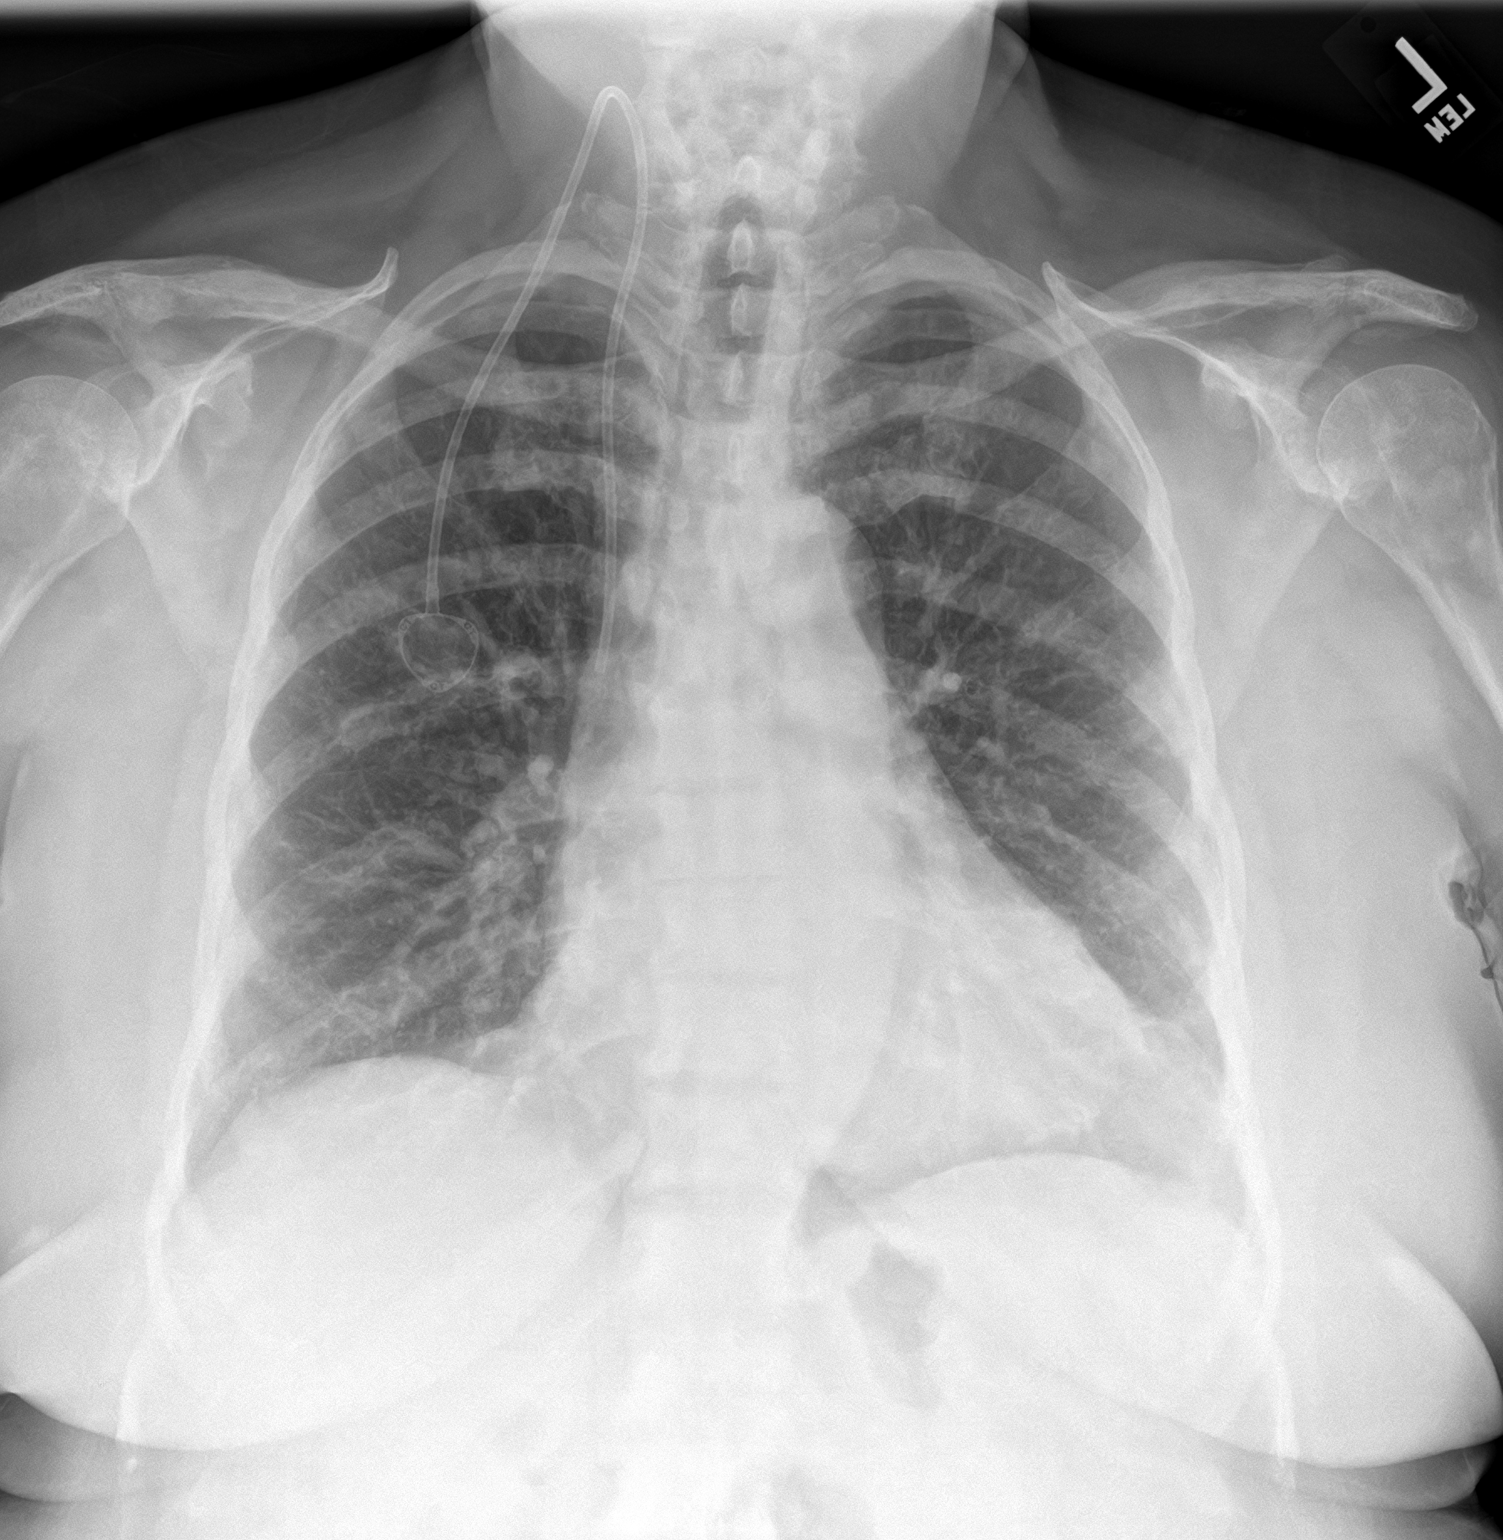
[im 2/2]
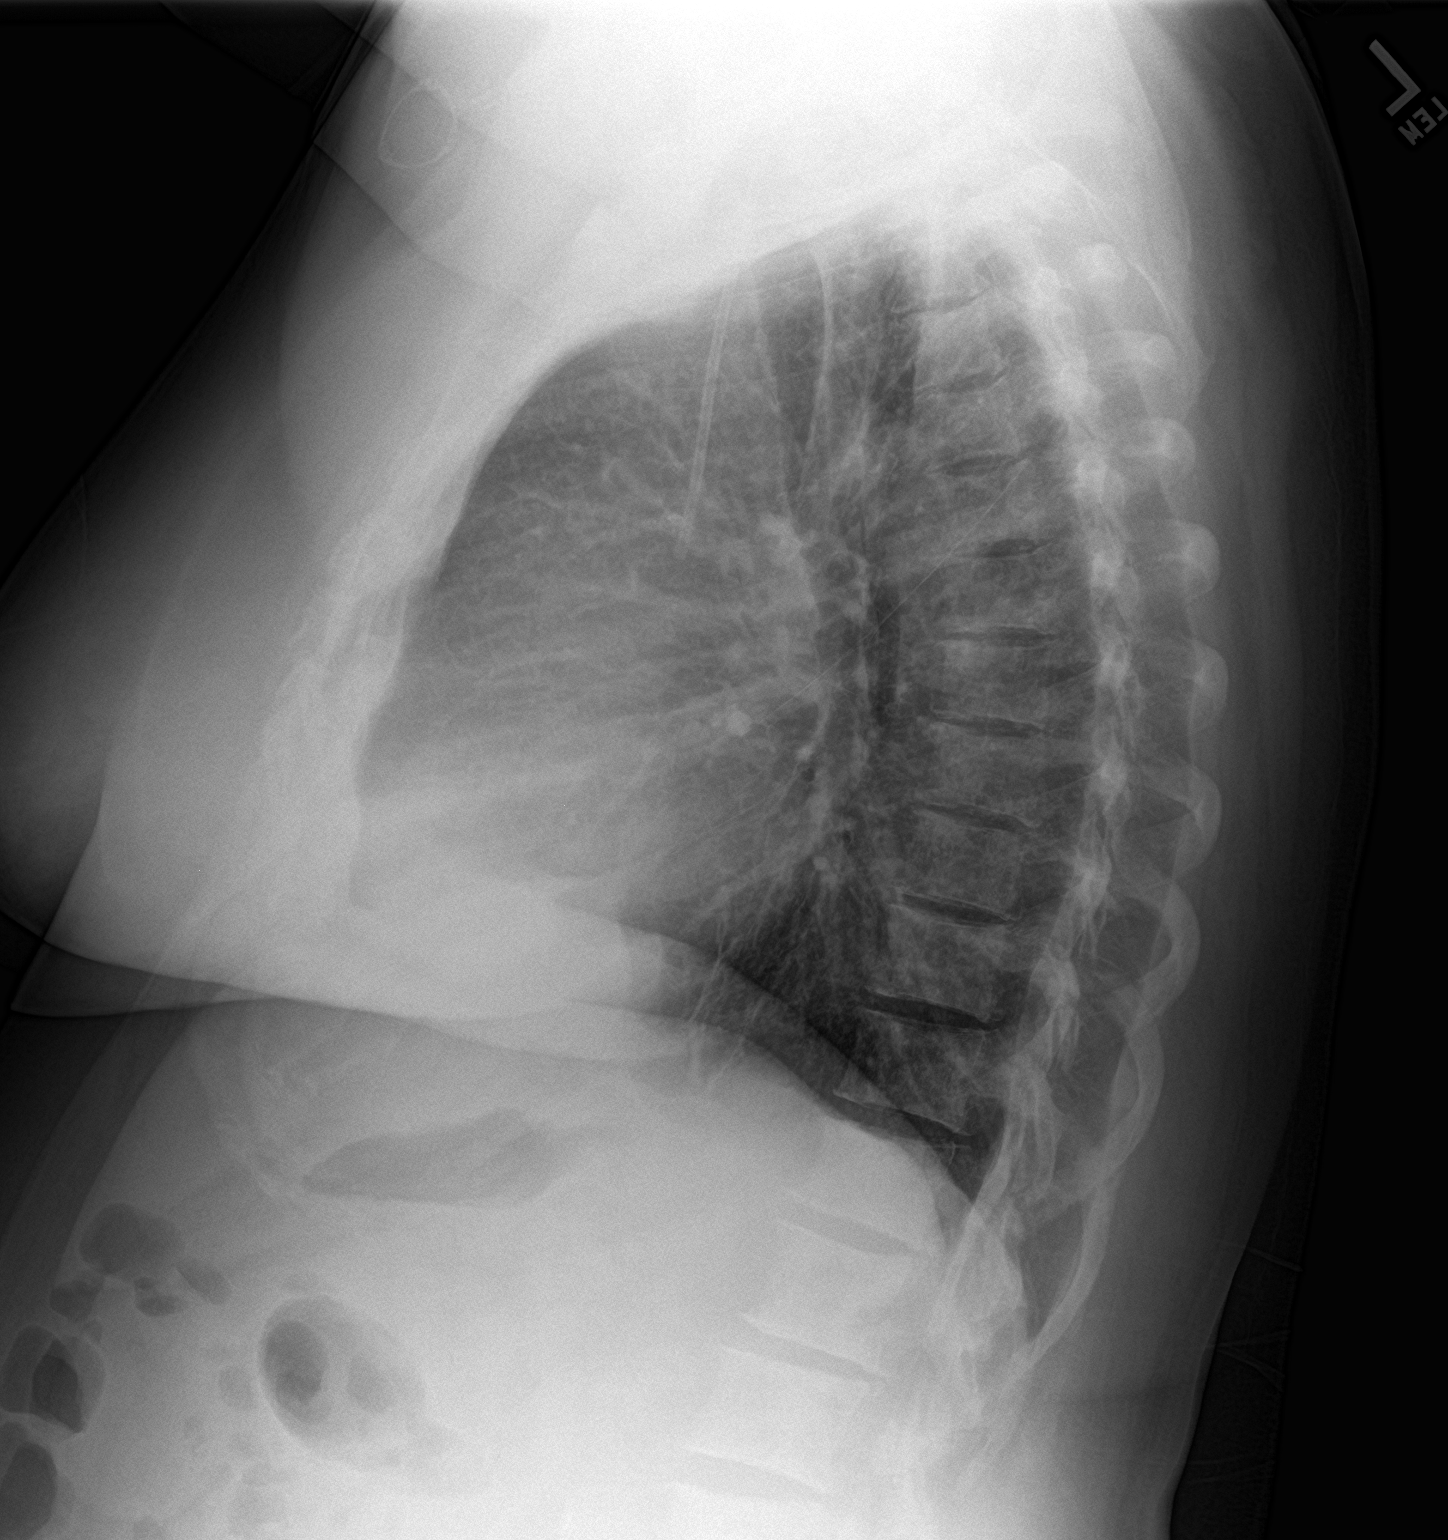

[2 of 2 positions shown; findings below may reference images not displayed]

FINDINGS: Right IJ single-lumen power injectable port catheter. The catheter
tip terminates in the mid SVC. Stable cardiac and mediastinal
contours. No overt pulmonary edema, new airspace consolidation,
pleural effusion or pneumothorax. Diffusely mottled appearance to
the bony structures consistent with known osseous metastatic
disease. Left basilar opacity consistent with prominent pericardial
fat pad.
IMPRESSION: No active cardiopulmonary disease.

## 2017-10-26 ENCOUNTER — Encounter (INDEPENDENT_AMBULATORY_CARE_PROVIDER_SITE_OTHER): Payer: Self-pay

## 2017-10-28 ENCOUNTER — Inpatient Hospital Stay (HOSPITAL_BASED_OUTPATIENT_CLINIC_OR_DEPARTMENT_OTHER): Payer: Medicare Other | Admitting: Oncology

## 2017-10-28 ENCOUNTER — Inpatient Hospital Stay: Payer: Medicare Other

## 2017-10-28 ENCOUNTER — Inpatient Hospital Stay: Payer: Medicare Other | Attending: Oncology

## 2017-10-28 ENCOUNTER — Encounter: Payer: Self-pay | Admitting: Oncology

## 2017-10-28 ENCOUNTER — Other Ambulatory Visit: Payer: Self-pay

## 2017-10-28 VITALS — BP 155/83 | HR 80 | Temp 97.1°F | Resp 18 | Wt 198.3 lb

## 2017-10-28 DIAGNOSIS — I82411 Acute embolism and thrombosis of right femoral vein: Secondary | ICD-10-CM | POA: Insufficient documentation

## 2017-10-28 DIAGNOSIS — C50912 Malignant neoplasm of unspecified site of left female breast: Secondary | ICD-10-CM | POA: Insufficient documentation

## 2017-10-28 DIAGNOSIS — Z7901 Long term (current) use of anticoagulants: Secondary | ICD-10-CM

## 2017-10-28 DIAGNOSIS — Z7983 Long term (current) use of bisphosphonates: Secondary | ICD-10-CM

## 2017-10-28 DIAGNOSIS — I82511 Chronic embolism and thrombosis of right femoral vein: Secondary | ICD-10-CM

## 2017-10-28 DIAGNOSIS — Z79899 Other long term (current) drug therapy: Secondary | ICD-10-CM | POA: Diagnosis not present

## 2017-10-28 DIAGNOSIS — C7951 Secondary malignant neoplasm of bone: Secondary | ICD-10-CM

## 2017-10-28 DIAGNOSIS — K760 Fatty (change of) liver, not elsewhere classified: Secondary | ICD-10-CM | POA: Diagnosis not present

## 2017-10-28 DIAGNOSIS — I10 Essential (primary) hypertension: Secondary | ICD-10-CM | POA: Diagnosis not present

## 2017-10-28 DIAGNOSIS — Z79811 Long term (current) use of aromatase inhibitors: Secondary | ICD-10-CM | POA: Insufficient documentation

## 2017-10-28 DIAGNOSIS — Z170 Estrogen receptor positive status [ER+]: Secondary | ICD-10-CM | POA: Insufficient documentation

## 2017-10-28 LAB — CBC WITH DIFFERENTIAL/PLATELET
BASOS ABS: 0.1 10*3/uL (ref 0–0.1)
BASOS PCT: 3 %
EOS ABS: 0 10*3/uL (ref 0–0.7)
Eosinophils Relative: 1 %
HEMATOCRIT: 37.3 % (ref 35.0–47.0)
HEMOGLOBIN: 13 g/dL (ref 12.0–16.0)
Lymphocytes Relative: 31 %
Lymphs Abs: 1.1 10*3/uL (ref 1.0–3.6)
MCH: 36 pg — ABNORMAL HIGH (ref 26.0–34.0)
MCHC: 34.9 g/dL (ref 32.0–36.0)
MCV: 103.1 fL — ABNORMAL HIGH (ref 80.0–100.0)
MONOS PCT: 4 %
Monocytes Absolute: 0.2 10*3/uL (ref 0.2–0.9)
NEUTROS ABS: 2.2 10*3/uL (ref 1.4–6.5)
NEUTROS PCT: 61 %
Platelets: 310 10*3/uL (ref 150–440)
RBC: 3.62 MIL/uL — AB (ref 3.80–5.20)
RDW: 16.5 % — ABNORMAL HIGH (ref 11.5–14.5)
WBC: 3.6 10*3/uL (ref 3.6–11.0)

## 2017-10-28 LAB — COMPREHENSIVE METABOLIC PANEL
ALT: 26 U/L (ref 0–44)
ANION GAP: 10 (ref 5–15)
AST: 29 U/L (ref 15–41)
Albumin: 4.3 g/dL (ref 3.5–5.0)
Alkaline Phosphatase: 64 U/L (ref 38–126)
BILIRUBIN TOTAL: 1 mg/dL (ref 0.3–1.2)
BUN: 14 mg/dL (ref 8–23)
CO2: 23 mmol/L (ref 22–32)
Calcium: 10.3 mg/dL (ref 8.9–10.3)
Chloride: 105 mmol/L (ref 98–111)
Creatinine, Ser: 0.97 mg/dL (ref 0.44–1.00)
GFR, EST NON AFRICAN AMERICAN: 56 mL/min — AB (ref >60)
Glucose, Bld: 129 mg/dL — ABNORMAL HIGH (ref 70–99)
POTASSIUM: 3.8 mmol/L (ref 3.5–5.1)
Sodium: 138 mmol/L (ref 135–145)
TOTAL PROTEIN: 7.4 g/dL (ref 6.5–8.1)

## 2017-10-28 MED ORDER — HEPARIN SOD (PORK) LOCK FLUSH 100 UNIT/ML IV SOLN
500.0000 [IU] | Freq: Once | INTRAVENOUS | Status: AC
  Administered 2017-10-28: 500 [IU] via INTRAVENOUS
  Filled 2017-10-28: qty 5

## 2017-10-28 MED ORDER — SODIUM CHLORIDE 0.9% FLUSH
10.0000 mL | Freq: Once | INTRAVENOUS | Status: AC
  Administered 2017-10-28: 10 mL via INTRAVENOUS
  Filled 2017-10-28: qty 10

## 2017-10-28 MED ORDER — SODIUM CHLORIDE 0.9 % IV SOLN
INTRAVENOUS | Status: DC
  Administered 2017-10-28: 11:00:00 via INTRAVENOUS
  Filled 2017-10-28: qty 1000

## 2017-10-28 MED ORDER — ZOLEDRONIC ACID 4 MG/100ML IV SOLN
4.0000 mg | Freq: Once | INTRAVENOUS | Status: AC
  Administered 2017-10-28: 4 mg via INTRAVENOUS
  Filled 2017-10-28: qty 100

## 2017-10-28 NOTE — Progress Notes (Signed)
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 Patient is a 77 year old female with no significant comorbidities who noticed left breast mass about 6-9 months ago. She thought it would go awaybut it continued to increase in size and began to involve her skin. She also noted tenderness to palpation as well as intermittent sharp tingling pain. She was seen by Dr. Adora Fridge on 08/25/2016 and patient had a biopsy of her skin in his office which showed:DIAGNOSIS:  A. LEFT BREAST SKIN; EXCISION:  - INVASIVE CARCINOMA MORPHOLOGICALLY CONSISTENT WITH MAMMARY ORIGIN  INVOLVING THE DERMIS.   BREAST BIOMARKER TESTS  Estrogen Receptor (ER) Status: POSITIVE, >90%  Progesterone Receptor (PgR) Status: POSITIVE, >70%  Her2 negative  2. Patient underwent bilateral diagnostic mammogram on 08/31/2016 showed:IMPRESSION: 1. Known left breast cancer, retroareolar with probable extension to the nipple, measuring 4.3 cm greatest dimension by ultrasound, with associated architectural distortion and associated left nipple retraction, and with associated diffuse skin thickening on the left. 2. No enlarged or morphologically abnormal lymph nodes are seen in the left axilla by ultrasound. 3. No evidence of malignancy within the right breast.  3. Patient was also complaining of some perirectal pain and underwent CT abdomen pelvis with contrast on 09/02/2016 which showed: IMPRESSION: Left colonic diverticulosis. No active diverticulitis.Fatty liver. No acute findings or evidence of  metastatic disease in the abdomen or pelvis. Diffuse bony sclerotic metastases.  4. Other than that her breast pain patient overall feels well. Denies any unintentional weight loss. She continues to be active and care for her great-grandchildren. She has had a hysterectomy in the past. No family history of breast or ovarian cancer.  5. Given that she had inflammatory breast cancer- plan was to give 4 cycles of dose dense AC followed by possible mastectomy and treat bone mets with AI + ibrance  6. PET CT scan on 09/11/16 showed: IMPRESSION: 1. Low-grade but abnormal hypermetabolic activity associated with the cutaneous and sub areolar glandular tissues of the left breast, compatible with malignancy, representative SUV 3.9 compared to contralateral normal side 1.4. 2. Diffuse sclerotic osseous metastatic disease with low-grade hypermetabolic activity, a representative SUV along the left sacrum 5.4. 3. Other imaging findings of potential clinical significance: Aortic Atherosclerosis (ICD10-I70.0). Descending and sigmoid colon Diverticulosis.  7. Baseline MUGA scan showed EF of 71%. Baseline CA 27.29 elevated at 563.2  8. 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  9. Letrozole and ibrance started in sept 2018. zometa monthly also started. Baseline bone density scan normal  10. ibrance held in oct 2018 after cycle 1 due to Grade 3 neutropenia and thrombocytopenia.cycle 2 restaretd on 02/04/17 at 100 mg. Patient continued to have prolongeed grade 3 neutropenia and dose lowered to 75 mg  11. Patient had extensive RLE DVT in April 2019 and is currently on eliquis. She also underwent venous thrombectomy and thrombolytic therapy and IVC placement by Dr. Lucky Cowboy   Interval history- she reports doing well. Has mild chronic fatigue and some exertional SOB which has been stable  ECOG PS- 1 Pain scale- 0   Review of systems-  Review of Systems  Constitutional: Positive for  malaise/fatigue. Negative for chills, fever and weight loss.  HENT: Negative for congestion, ear discharge and nosebleeds.   Eyes: Negative for blurred vision.  Respiratory: Positive for shortness of breath. Negative for cough, hemoptysis, sputum production and wheezing.   Cardiovascular: Negative for chest pain, palpitations, orthopnea and claudication.  Gastrointestinal: Negative for abdominal pain, blood in stool, constipation, diarrhea, heartburn, melena, nausea and vomiting.  Genitourinary: Negative for dysuria, flank pain, frequency, hematuria and urgency.  Musculoskeletal: Negative for back pain, joint pain and myalgias.  Skin: Negative for rash.  Neurological: Negative for dizziness, tingling, focal weakness, seizures, weakness and headaches.  Endo/Heme/Allergies: Does not bruise/bleed easily.  Psychiatric/Behavioral: Negative for depression and suicidal ideas. The patient does not have insomnia.      No Known Allergies   Past Medical History:  Diagnosis Date  . Breast cancer ([place])   . Cancer ([place])    left breast  . DVT (deep venous thrombosis) ([place])    2019  . Hypertension   . Port catheter in place 09/14/2016   Placed 09/10/2016 RT chest.     Past Surgical History:  Procedure Laterality Date  . ABDOMINAL HYSTERECTOMY    . APPENDECTOMY    . BREAST BIOPSY    . CYST EXCISION Right 09/2016   Buttocks  . PERIPHERAL VASCULAR THROMBECTOMY Right 08/30/2017   Procedure: PERIPHERAL VASCULAR THROMBECTOMY;  Surgeon: Algernon Huxley, MD;  Location: [place] [facility];  Service: Cardiovascular;  Laterality: Right;  . PORTACATH PLACEMENT Right 09/10/2016   Procedure: INSERTION PORT-A-CATH;  Surgeon: Jules Husbands, MD;  Location: [facility];  Service: General;  Laterality: Right;    Social History   Socioeconomic History  . Marital status: Married    Spouse name: Jeneen Rinks  . Number of children: 2  . Years of education: 6  . Highest education level: Bachelor's degree (e.g., BA, AB,  BS)  Occupational History  . 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[employer]  Social Needs  . Financial resource strain: 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  . Food insecurity:    Worry: Never true    Inability: Never true  . Transportation needs:    Medical: No    Non-medical: No  Tobacco Use  . Smoking status: Never Smoker  . Smokeless tobacco: Never Used  Substance and Sexual Activity  . Alcohol use: No  . Drug use: No  . Sexual activity: Never  Lifestyle  . Physical activity:    Days per week: Patient refused    Minutes per session: Patient refused  . Stress: Not at all  Relationships  . Social connections:    Talks on phone: More than three times a week    Gets together: Once a week    Attends religious service: More than 4 times per year    Active member of club or organization: No    Attends meetings of clubs or organizations: Never    Relationship status: Not on file  . Intimate partner violence:    Fear of current or ex partner: Not on file    Emotionally abused: Not on file    Physically abused: Not on file    Forced sexual activity: Not on file  Other Topics Concern  . Not on file  Social History Narrative  . Not on file    Family History  Problem Relation Age of Onset  . Parkinson's disease Mother   . Bladder Cancer Father   .  Lung cancer Father      Current Outpatient Medications:  .  acetaminophen (TYLENOL) 500 MG tablet, Take 1,000 mg by mouth every 6 (six) hours as needed for moderate pain., Disp: , Rfl:  .  amLODipine (NORVASC) 10 MG tablet, Take 10 mg by mouth daily., Disp: , Rfl:  .  apixaban (ELIQUIS) 5 MG TABS tablet, Take 1 tablet (5 mg total) by mouth 2 (two) times daily., Disp: 60 tablet, Rfl: 1 .  Calcium Carb-Cholecalciferol (CALCIUM 600/VITAMIN D3 PO), Take 1 tablet by mouth daily., Disp: , Rfl:  .  hydrochlorothiazide (MICROZIDE) 12.5 MG capsule, Take 12.5 mg by mouth daily. , Disp: , Rfl:  .  IBRANCE 75 MG capsule, TAKE 1 CAPSULE (75  MG TOTAL) BY MOUTH DAILY WITH BREAKFAST. TAKE FOR 21 DAYS ON, THEN 7 DAYS OFF., Disp: 21 capsule, Rfl: 3 .  letrozole (FEMARA) 2.5 MG tablet, Take 1 tablet (2.5 mg total) by mouth daily., Disp: 90 tablet, Rfl: 3 .  PREVIDENT 5000 BOOSTER PLUS 1.1 % PSTE, , Disp: , Rfl:  .  prochlorperazine (COMPAZINE) 10 MG tablet, Take 1 tablet (10 mg total) by mouth every 6 (six) hours as needed (Nausea or vomiting)., Disp: 30 tablet, Rfl: 1 .  Vitamin D, Cholecalciferol, 400 units CAPS, Take 400 Units by mouth daily. , Disp: , Rfl:   Physical exam:  Vitals:   10/28/17 1039  BP: (!) 155/83  Pulse: 80  Resp: 18  Temp: (!) 97.1 F (36.2 C)  TempSrc: Tympanic  Weight: 198 lb 4.8 oz (89.9 kg)   Physical Exam  Constitutional: She is oriented to person, place, and time. She appears well-developed and well-nourished.  HENT:  Head: Normocephalic and atraumatic.  Eyes: Pupils are equal, round, and reactive to light. EOM are normal.  Neck: Normal range of motion.  Cardiovascular: Normal rate, regular rhythm and normal heart sounds.  Pulmonary/Chest: Effort normal and breath sounds normal.  Abdominal: Soft. Bowel sounds are normal.  Musculoskeletal:  Compression stocking in place over RLE  Neurological: She is alert and oriented to person, place, and time.  Skin: Skin is warm and dry.   there is skin induration noted over left breast. No distinct palpable mass   CMP Latest Ref Rng & Units 09/30/2017  Glucose 65 - 99 mg/dL 111(H)  BUN 6 - 20 mg/dL 11  Creatinine 0.44 - 1.00 mg/dL 0.85  Sodium 135 - 145 mmol/L 138  Potassium 3.5 - 5.1 mmol/L 3.7  Chloride 101 - 111 mmol/L 104  CO2 22 - 32 mmol/L 24  Calcium 8.9 - 10.3 mg/dL 10.2  Total Protein 6.5 - 8.1 g/dL 7.2  Total Bilirubin 0.3 - 1.2 mg/dL 1.0  Alkaline Phos 38 - 126 U/L 62  AST 15 - 41 U/L 29  ALT 14 - 54 U/L 24   CBC Latest Ref Rng & Units 09/30/2017  WBC 3.6 - 11.0 K/uL 2.8(L)  Hemoglobin 12.0 - 16.0 g/dL 12.2  Hematocrit 35.0 - 47.0 %  34.7(L)  Platelets 150 - 440 K/uL 309     Assessment and plan- Patient is a 77 y.o. female female Stage IV ER PR positive her 2 negative breast cancer with bone only mets who is here for routine f/u on ibrance and letrozole  1. CA 27.29 pending from today. Overall she is tolerating the combination well and will continue letrozole and ibrance at this time. Next cycle starts on 11/19/17. I will see her back in 1 months time- cbc/cmp and CA 27.29. Scans-  CT chest abdomen plevis with contrast and bone scan prior. zometa today and in 4 weeks  2. RLE DVT- she will be on eliquis indefinitely. IVC filter to be removed by vascular surgery in october     Visit Diagnosis 1. Malignant neoplasm of left breast in female, estrogen receptor positive, unspecified site of breast ([place])   2. High risk medication use   3. Use of letrozole ([place])   4. Chronic deep vein thrombosis (DVT) of femoral vein of right lower extremity (HCC)      Dr. Randa Evens, MD, MPH [facility] [phone] 10/28/2017 11:06 AM

## 2017-10-28 NOTE — Progress Notes (Signed)
Here for follow up. No voiced c/o " Im doing ok "

## 2017-10-29 LAB — CANCER ANTIGEN 27.29: CAN 27.29: 88.9 U/mL — AB (ref 0.0–38.6)

## 2017-11-09 ENCOUNTER — Telehealth: Payer: Self-pay | Admitting: Pharmacist

## 2017-11-09 NOTE — Telephone Encounter (Signed)
Oral Chemotherapy Pharmacist Encounter  Follow-Up Form  Called patient today to follow up regarding patient's oral chemotherapy medication: Ibrance (palbociclib)  Original Start date of oral chemotherapy: 12/2016  Pt reports 0 tablets/doses of Ibrance missed so far this cycle.  Pt reports the following side effects: None reported  Recent labs reviewed: CA 27.29 from 10/28/17  New medications?: None reported  Other Issues: Patient having issue with her DVT and "blood pooling" in her feet. She has an appt with vascular tomorrow to evaluate this issue.   Patient knows to call the office with questions or concerns. Oral Oncology Clinic will continue to follow.  Darl Pikes, PharmD, BCPS, BCOP Hematology/Oncology Clinical Pharmacist [facility] [phone]  11/09/2017 12:12 PM

## 2017-11-10 ENCOUNTER — Other Ambulatory Visit (INDEPENDENT_AMBULATORY_CARE_PROVIDER_SITE_OTHER): Payer: Self-pay | Admitting: Vascular Surgery

## 2017-11-10 ENCOUNTER — Ambulatory Visit (INDEPENDENT_AMBULATORY_CARE_PROVIDER_SITE_OTHER): Payer: Medicare Other | Admitting: Vascular Surgery

## 2017-11-10 ENCOUNTER — Other Ambulatory Visit (INDEPENDENT_AMBULATORY_CARE_PROVIDER_SITE_OTHER): Payer: Medicare Other

## 2017-11-10 ENCOUNTER — Encounter (INDEPENDENT_AMBULATORY_CARE_PROVIDER_SITE_OTHER): Payer: Self-pay

## 2017-11-10 ENCOUNTER — Encounter (INDEPENDENT_AMBULATORY_CARE_PROVIDER_SITE_OTHER): Payer: Self-pay | Admitting: Vascular Surgery

## 2017-11-10 VITALS — BP 181/76 | HR 73 | Resp 16 | Ht 62.0 in | Wt 197.0 lb

## 2017-11-10 DIAGNOSIS — I739 Peripheral vascular disease, unspecified: Secondary | ICD-10-CM | POA: Insufficient documentation

## 2017-11-10 DIAGNOSIS — M79604 Pain in right leg: Secondary | ICD-10-CM

## 2017-11-10 DIAGNOSIS — I82511 Chronic embolism and thrombosis of right femoral vein: Secondary | ICD-10-CM | POA: Diagnosis not present

## 2017-11-10 MED ORDER — OXYCODONE-ACETAMINOPHEN 5-325 MG PO TABS: 1.0000 | ORAL_TABLET | Freq: Four times a day (QID) | ORAL | 0 refills | Status: DC | PRN

## 2017-11-10 MED ORDER — CEFAZOLIN SODIUM-DEXTROSE 2-4 GM/100ML-% IV SOLN: 2.0000 g | Freq: Once | INTRAVENOUS | Status: DC

## 2017-11-10 NOTE — Progress Notes (Signed)
Subjective:    Patient ID: Victoria Steele, female    DOB: 1940-11-26, 77 y.o.   MRN: [id number] Chief Complaint  Patient presents with  . Follow-up    pain with toes on right foot   The patient presents sooner than her 50-month right lower extremity DVT follow-up.  The patient endorses a history of progressively worsening discoloration and pain to the toes located on the right foot.  The patient notes that over the last few weeks there has been a darkening in the discoloration of her toes.  Her discomfort is also progressed to the point that she is unable to function on a daily basis.  This is what prompted her to seek medical attention.  The patient denies any claudication-like symptoms, rest pain or ulcer formation to the right lower extremity.  The patient continues to take Eliquis 5 mg 1 tab p.o. twice daily. The patient denies any shortness of breath or chest pain.  The patient does have a Port-A-Cath in place for rest cancer metastatic to the bone.  The patient underwent a stat ABI which was notable for a focal lesion in the right SFA with monophasic flow distally.  Flat first right digit waveforms.  Dampened second through fifth toe digits.  Patient denies any fever, nausea vomiting.  Review of Systems  Constitutional: Negative.   HENT: Negative.   Eyes: Negative.   Respiratory: Negative.   Cardiovascular: Positive for leg swelling.       Right lower extremity pain  Gastrointestinal: Negative.   Endocrine: Negative.   Genitourinary: Negative.   Musculoskeletal: Negative.   Skin: Negative.   Allergic/Immunologic: Negative.   Neurological: Negative.   Hematological: Negative.   Psychiatric/Behavioral: Negative.       Objective:   Physical Exam  Constitutional: She is oriented to person, place, and time. She appears well-developed and well-nourished. No distress.  HENT:  Head: Normocephalic and atraumatic.  Right Ear: External ear normal.  Left Ear: External ear normal.  Eyes:  Pupils are equal, round, and reactive to light. Conjunctivae and EOM are normal.  Neck: Normal range of motion.  Cardiovascular: Normal rate, regular rhythm, normal heart sounds and intact distal pulses.  Pulses:      Radial pulses are 2+ on the right side, and 2+ on the left side.  Hard to palpate pedal pulses however the foot is warm to approximately midfoot and the toes do become cooler.  Pulmonary/Chest: Effort normal and breath sounds normal.  Musculoskeletal: Normal range of motion. She exhibits edema (Mild nonpitting edema noted bilaterally).  Neurological: She is alert and oriented to person, place, and time.  Skin: She is not diaphoretic.  Right foot: Toes 1 through 5 with a dark and bluish color.  No ulceration.  No gangrene.  No cellulitis to the foot.  Psychiatric: She has a normal mood and affect. Her behavior is normal. Judgment and thought content normal.  Vitals reviewed.  BP (!) 181/76 (BP Location: Right Arm)   Pulse 73   Resp 16   Ht 5\' 2"  (1.575 m)   Wt 197 lb (89.4 kg)   BMI 36.03 kg/m   Past Medical History:  Diagnosis Date  . Breast cancer ([place])   . Cancer ([place])    left breast  . DVT (deep venous thrombosis) ([place])    2019  . Hypertension   . Port catheter in place 09/14/2016   Placed 09/10/2016 RT chest.   Social History   Socioeconomic History  . Marital  status: Married    Spouse name: Jeneen Rinks  . Number of children: 2  . Years of education: 6  . Highest education level: Bachelor's degree (e.g., BA, AB, BS)  Occupational History  . 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 Financial resource strain: 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  . Food insecurity:    Worry: Never true    Inability: Never true  . Transportation needs:    Medical: No    Non-medical: No  Tobacco Use  . Smoking status: Never Smoker  . Smokeless tobacco: Never Used  Substance and Sexual Activity  . Alcohol use: No  . Drug use: No  . Sexual activity:  Never  Lifestyle  . Physical activity:    Days per week: Patient refused    Minutes per session: Patient refused  . Stress: Not at all  Relationships  . Social connections:    Talks on phone: More than three times a week    Gets together: Once a week    Attends religious service: More than 4 times per year    Active member of club or organization: No    Attends meetings of clubs or organizations: Never    Relationship status: Not on file  . Intimate partner violence:    Fear of current or ex partner: Not on file    Emotionally abused: Not on file    Physically abused: Not on file    Forced sexual activity: Not on file  Other Topics Concern  . Not on file  Social History Narrative  . Not on file   Past Surgical History:  Procedure Laterality Date  . ABDOMINAL HYSTERECTOMY    . APPENDECTOMY    . BREAST BIOPSY    . CYST EXCISION Right 09/2016   Buttocks  . PERIPHERAL VASCULAR THROMBECTOMY Right 08/30/2017   Procedure: PERIPHERAL VASCULAR THROMBECTOMY;  Surgeon: Algernon Huxley, MD;  Location: [place] [facility];  Service: Cardiovascular;  Laterality: Right;  . PORTACATH PLACEMENT Right 09/10/2016   Procedure: INSERTION PORT-A-CATH;  Surgeon: Jules Husbands, MD;  Location: [facility];  Service: General;  Laterality: Right;   Family History  Problem Relation Age of Onset  . Parkinson's disease Mother   . Bladder Cancer Father   . Lung cancer Father    No Known Allergies     Assessment & Plan:  The patient presents sooner than her 65-month right lower extremity DVT follow-up.  The patient endorses a history of progressively worsening discoloration and pain to the toes located on the right foot.  The patient notes that over the last few weeks there has been a darkening in the discoloration of her toes.  Her discomfort is also progressed to the point that she is unable to function on a daily basis.  This is what prompted her to seek medical attention.  The patient denies any  claudication-like symptoms, rest pain or ulcer formation to the right lower extremity.  The patient continues to take Eliquis 5 mg 1 tab p.o. twice daily. The patient denies any shortness of breath or chest pain.  The patient does have a Port-A-Cath in place for rest cancer metastatic to the bone.  The patient underwent a stat ABI which was notable for a focal lesion in the right SFA with monophasic flow distally.  Flat first right digit waveforms.  Dampened second through fifth toe digits.  Patient denies any fever, nausea vomiting  1. Chronic deep vein  thrombosis (DVT) of femoral vein of right lower extremity (HCC) - Stable Patient has been wearing compression socks to her bilateral lower extremity which has controlled her edema Patient denies any shortness of breath or chest pain Patient is scheduled to have her IVC filter removed by Dr. Lucky Cowboy in October 2019 Patient continues to take Eliquis 5 mg 1 tab p.o. twice daily  2. PAD (peripheral artery disease) ([place]) - New Pres[place] with progressively worsening right toe pain and discoloration. Stat duplex/ABI found focal lesion in the right SFA with monophasic blood flow and dampened toe waves Recommend a right lower extremity angiogram with possible intervention and attempt to assess the patient's anatomy and degree of peripheral artery disease.  If appropriate an attempt to revascularize the extremity can be made at that time Procedure, risks and benefits explained to the patient All questions answered Patient wishes to proceed Oxycodone 5 mg 1 tab every 6 as needed for pain #28 given  Current Outpatient Medications on File Prior to Visit  Medication Sig Dispense Refill  . acetaminophen (TYLENOL) 500 MG tablet Take 1,000 mg by mouth every 6 (six) hours as needed for moderate pain.    Marland Kitchen amLODipine (NORVASC) 10 MG tablet Take 10 mg by mouth daily.    Marland Kitchen apixaban (ELIQUIS) 5 MG TABS tablet Take 1 tablet (5 mg total) by mouth 2 (two) times daily. 60  tablet 1  . Calcium Carb-Cholecalciferol (CALCIUM 600/VITAMIN D3 PO) Take 1 tablet by mouth daily.    . hydrochlorothiazide (MICROZIDE) 12.5 MG capsule Take 12.5 mg by mouth daily.     Leslee Home 75 MG capsule TAKE 1 CAPSULE (75 MG TOTAL) BY MOUTH DAILY WITH BREAKFAST. TAKE FOR 21 DAYS ON, THEN 7 DAYS OFF. 21 capsule 3  . letrozole (FEMARA) 2.5 MG tablet Take 1 tablet (2.5 mg total) by mouth daily. 90 tablet 3  . PREVIDENT 5000 BOOSTER PLUS 1.1 % PSTE     . Vitamin D, Cholecalciferol, 400 units CAPS Take 400 Units by mouth daily.     . prochlorperazine (COMPAZINE) 10 MG tablet Take 1 tablet (10 mg total) by mouth every 6 (six) hours as needed (Nausea or vomiting). (Patient not taking: Reported on 10/28/2017) 30 tablet 1   No current facility-administered medications on file prior to visit.    There are no Patient Instructions on file for this visit. No follow-ups on file.  Roverto Bodmer A Georgana Romain, PA-C

## 2017-11-11 ENCOUNTER — Ambulatory Visit
Admission: RE | Admit: 2017-11-11 | Discharge: 2017-11-11 | Disposition: A | Discharge status: Home / Self Care | Payer: Medicare Other | Source: Ambulatory Visit | Attending: Vascular Surgery | Admitting: Vascular Surgery

## 2017-11-11 ENCOUNTER — Encounter
Admission: RE | Disposition: A | Discharge status: Home / Self Care | Payer: Self-pay | Source: Ambulatory Visit | Attending: Vascular Surgery

## 2017-11-11 DIAGNOSIS — Z853 Personal history of malignant neoplasm of breast: Secondary | ICD-10-CM | POA: Diagnosis not present

## 2017-11-11 DIAGNOSIS — Z8052 Family history of malignant neoplasm of bladder: Secondary | ICD-10-CM | POA: Insufficient documentation

## 2017-11-11 DIAGNOSIS — I998 Other disorder of circulatory system: Secondary | ICD-10-CM | POA: Insufficient documentation

## 2017-11-11 DIAGNOSIS — Z801 Family history of malignant neoplasm of trachea, bronchus and lung: Secondary | ICD-10-CM | POA: Insufficient documentation

## 2017-11-11 DIAGNOSIS — Z79899 Other long term (current) drug therapy: Secondary | ICD-10-CM | POA: Insufficient documentation

## 2017-11-11 DIAGNOSIS — Z7901 Long term (current) use of anticoagulants: Secondary | ICD-10-CM | POA: Insufficient documentation

## 2017-11-11 DIAGNOSIS — Z90710 Acquired absence of both cervix and uterus: Secondary | ICD-10-CM | POA: Insufficient documentation

## 2017-11-11 DIAGNOSIS — Z9889 Other specified postprocedural states: Secondary | ICD-10-CM | POA: Diagnosis not present

## 2017-11-11 DIAGNOSIS — I82511 Chronic embolism and thrombosis of right femoral vein: Secondary | ICD-10-CM | POA: Diagnosis not present

## 2017-11-11 DIAGNOSIS — I70238 Atherosclerosis of native arteries of right leg with ulceration of other part of lower right leg: Secondary | ICD-10-CM | POA: Diagnosis not present

## 2017-11-11 DIAGNOSIS — I70211 Atherosclerosis of native arteries of extremities with intermittent claudication, right leg: Secondary | ICD-10-CM | POA: Insufficient documentation

## 2017-11-11 DIAGNOSIS — I70219 Atherosclerosis of native arteries of extremities with intermittent claudication, unspecified extremity: Secondary | ICD-10-CM

## 2017-11-11 DIAGNOSIS — I10 Essential (primary) hypertension: Secondary | ICD-10-CM | POA: Diagnosis not present

## 2017-11-11 HISTORY — PX: LOWER EXTREMITY ANGIOGRAPHY: CATH118251

## 2017-11-11 SURGERY — LOWER EXTREMITY ANGIOGRAPHY
Anesthesia: Moderate Sedation | Laterality: Right

## 2017-11-11 MED ORDER — HYDROMORPHONE HCL 1 MG/ML IJ SOLN: 1.0000 mg | Freq: Once | INTRAMUSCULAR | Status: DC | PRN

## 2017-11-11 MED ORDER — FAMOTIDINE 20 MG PO TABS: 40.0000 mg | ORAL_TABLET | ORAL | Status: DC | PRN

## 2017-11-11 MED ORDER — CLOPIDOGREL BISULFATE 75 MG PO TABS
ORAL_TABLET | ORAL | Status: AC
  Filled 2017-11-11: qty 2

## 2017-11-11 MED ORDER — SODIUM CHLORIDE 0.9% FLUSH: 3.0000 mL | INTRAVENOUS | Status: DC | PRN

## 2017-11-11 MED ORDER — MIDAZOLAM HCL 2 MG/ML PO SYRP
ORAL_SOLUTION | ORAL | Status: AC
  Administered 2017-11-11: 8 mg via ORAL
  Filled 2017-11-11: qty 4

## 2017-11-11 MED ORDER — ONDANSETRON HCL 4 MG/2ML IJ SOLN: 4.0000 mg | Freq: Four times a day (QID) | INTRAMUSCULAR | Status: DC | PRN

## 2017-11-11 MED ORDER — LABETALOL HCL 5 MG/ML IV SOLN: 10.0000 mg | INTRAVENOUS | Status: DC | PRN

## 2017-11-11 MED ORDER — MIDAZOLAM HCL 2 MG/ML PO SYRP
4.0000 mg | ORAL_SOLUTION | Freq: Once | ORAL | Status: AC
  Administered 2017-11-11: 8 mg via ORAL

## 2017-11-11 MED ORDER — ACETAMINOPHEN 325 MG PO TABS: 650.0000 mg | ORAL_TABLET | ORAL | Status: DC | PRN

## 2017-11-11 MED ORDER — FENTANYL CITRATE (PF) 100 MCG/2ML IJ SOLN
INTRAMUSCULAR | Status: DC | PRN
  Administered 2017-11-11: 50 ug via INTRAVENOUS

## 2017-11-11 MED ORDER — SODIUM CHLORIDE 0.9% FLUSH: 3.0000 mL | Freq: Two times a day (BID) | INTRAVENOUS | Status: DC

## 2017-11-11 MED ORDER — SODIUM CHLORIDE 0.9 % IV SOLN: 250.0000 mL | INTRAVENOUS | Status: DC | PRN

## 2017-11-11 MED ORDER — SODIUM CHLORIDE 0.9 % IV SOLN
INTRAVENOUS | Status: DC
  Administered 2017-11-11: 12:00:00 via INTRAVENOUS

## 2017-11-11 MED ORDER — ATORVASTATIN CALCIUM 10 MG PO TABS: 10.0000 mg | ORAL_TABLET | Freq: Every day | ORAL | Status: DC

## 2017-11-11 MED ORDER — LIDOCAINE HCL (PF) 1 % IJ SOLN
INTRAMUSCULAR | Status: AC
  Filled 2017-11-11: qty 30

## 2017-11-11 MED ORDER — HEPARIN SODIUM (PORCINE) 1000 UNIT/ML IJ SOLN
INTRAMUSCULAR | Status: AC
  Filled 2017-11-11: qty 1

## 2017-11-11 MED ORDER — MORPHINE SULFATE (PF) 4 MG/ML IV SOLN: 2.0000 mg | INTRAVENOUS | Status: DC | PRN

## 2017-11-11 MED ORDER — HEPARIN SODIUM (PORCINE) 1000 UNIT/ML IJ SOLN
INTRAMUSCULAR | Status: DC | PRN
  Administered 2017-11-11: 4000 [IU] via INTRAVENOUS

## 2017-11-11 MED ORDER — ASPIRIN EC 81 MG PO TBEC: 81.0000 mg | DELAYED_RELEASE_TABLET | Freq: Every day | ORAL | 2 refills | Status: DC

## 2017-11-11 MED ORDER — CEFAZOLIN SODIUM-DEXTROSE 2-4 GM/100ML-% IV SOLN
INTRAVENOUS | Status: AC
  Administered 2017-11-11: 2 g
  Filled 2017-11-11: qty 100

## 2017-11-11 MED ORDER — FENTANYL CITRATE (PF) 100 MCG/2ML IJ SOLN
INTRAMUSCULAR | Status: AC
  Filled 2017-11-11: qty 2

## 2017-11-11 MED ORDER — CLOPIDOGREL BISULFATE 75 MG PO TABS
150.0000 mg | ORAL_TABLET | ORAL | Status: AC
  Administered 2017-11-11: 150 mg via ORAL

## 2017-11-11 MED ORDER — OXYCODONE HCL 5 MG PO TABS: 5.0000 mg | ORAL_TABLET | ORAL | Status: DC | PRN

## 2017-11-11 MED ORDER — SODIUM CHLORIDE 0.9 % IV SOLN
INTRAVENOUS | Status: DC
  Administered 2017-11-11: 14:00:00 via INTRAVENOUS

## 2017-11-11 MED ORDER — MIDAZOLAM HCL 2 MG/2ML IJ SOLN
INTRAMUSCULAR | Status: AC
  Filled 2017-11-11: qty 4

## 2017-11-11 MED ORDER — MIDAZOLAM HCL 2 MG/ML PO SYRP: 4.0000 mg | ORAL_SOLUTION | Freq: Once | ORAL | Status: DC | PRN

## 2017-11-11 MED ORDER — MIDAZOLAM HCL 2 MG/2ML IJ SOLN
INTRAMUSCULAR | Status: DC | PRN
  Administered 2017-11-11: 2 mg via INTRAVENOUS

## 2017-11-11 MED ORDER — HEPARIN (PORCINE) IN NACL 1000-0.9 UT/500ML-% IV SOLN
INTRAVENOUS | Status: AC
  Filled 2017-11-11: qty 1000

## 2017-11-11 MED ORDER — HYDRALAZINE HCL 20 MG/ML IJ SOLN: 5.0000 mg | INTRAMUSCULAR | Status: DC | PRN

## 2017-11-11 MED ORDER — METHYLPREDNISOLONE SODIUM SUCC 125 MG IJ SOLR: 125.0000 mg | INTRAMUSCULAR | Status: DC | PRN

## 2017-11-11 SURGICAL SUPPLY — 18 items
BALLN LUTONIX DCB 5X60X130 (BALLOONS) ×3
BALLOON LUTONIX DCB 5X60X130 (BALLOONS) ×1 IMPLANT
CATH G STR 4FX120.038 (CATHETERS) ×3 IMPLANT
CATH PIG 70CM (CATHETERS) ×3 IMPLANT
DEVICE PRESTO INFLATION (MISCELLANEOUS) ×3 IMPLANT
DEVICE STARCLOSE SE CLOSURE (Vascular Products) ×3 IMPLANT
DEVICE TORQUE .025-.038 (MISCELLANEOUS) ×3 IMPLANT
NEEDLE ENTRY 21GA 7CM ECHOTIP (NEEDLE) ×3 IMPLANT
PACK ANGIOGRAPHY (CUSTOM PROCEDURE TRAY) ×3 IMPLANT
SET INTRO CAPELLA COAXIAL (SET/KITS/TRAYS/PACK) ×3 IMPLANT
SHEATH BRITE TIP 5FRX11 (SHEATH) ×3 IMPLANT
SHEATH RAABE 6FR (SHEATH) ×3 IMPLANT
SHIELD RADPAD SCOOP 12X17 (MISCELLANEOUS) ×3 IMPLANT
STENT LIFESTENT 5F 6X60X135 (Permanent Stent) ×3 IMPLANT
TUBING CONTRAST HIGH PRESS 72 (TUBING) ×3 IMPLANT
WIRE AQUATRACK .035X260CM (WIRE) ×3 IMPLANT
WIRE J 3MM .035X145CM (WIRE) ×3 IMPLANT
WIRE MAGIC TORQUE 315CM (WIRE) ×3 IMPLANT

## 2017-11-11 NOTE — Op Note (Signed)
[facility] Percutaneous Study/Intervention Procedural Note   Date of Surgery: 11/11/2017  Surgeon:  Katha Cabal, MD.  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 Introduction catheter into right lower extremity 3rd order catheter placement  2. Contrast injection right lower extremity for distal runoff   3. Percutaneous transluminal angioplasty and stent placement right superficial femoral artery and popliteal 4. Star close closure left common femoral arteriotomy  Anesthesia: Conscious sedation was administered under my direct supervision by the interventional radiology RN. IV Versed plus fentanyl were utilized. Continuous ECG, pulse oximetry and blood pressure was monitored throughout the entire procedure.  Conscious sedation was for a total of 71 minutes.  Sheath: 6 French Raby left common femoral retrograde approach  Contrast: 50 cc  Fluoroscopy Time: 8.0 minutes  Indications: Victoria Steele presents with ischemia of the right great toe.  She is at risk for limb loss.  Noninvasive studies demonstrate a high-grade stenosis of the SFA.  She is therefore undergoing angiography with the hope for intervention for limb salvage.  The risks and benefits are reviewed all questions answered patient agrees to proceed.  Procedure: Victoria Steele is a 77 y.o. y.o. female who was identified and appropriate procedural time out was performed. The patient was then placed supine on the table and prepped and draped in the usual sterile fashion.   Ultrasound was placed in the sterile sleeve and the left groin was evaluated the left common femoral artery was echolucent and pulsatile indicating patency.  Image was recorded for the permanent record and under real-time visualization a microneedle was inserted  into the common femoral artery microwire followed by a micro-sheath.  A J-wire was then advanced through the micro-sheath and a  5 [place] sheath was then inserted over a J-wire. J-wire was then advanced and a 5 French pigtail catheter was positioned at the level of T12. AP projection of the aorta was then obtained. Pigtail catheter was repositioned to above the bifurcation and a LAO view of the pelvis was obtained.  Subsequently a pigtail catheter with the stiff angle Glidewire was used to cross the aortic bifurcation the catheter wire were advanced down into the right distal external iliac artery. Oblique view of the femoral bifurcation was then obtained and subsequently the wire was reintroduced and the pigtail catheter negotiated into the SFA representing third order catheter placement. Distal runoff was then performed.  4000 units of heparin was then given and allowed to circulate and a 6 [place] Raby sheath was advanced up and over the bifurcation and positioned in the femoral artery and distal runoff was completed.  The aqua track wire and a 4 French glide catheter was then negotiated through the stenosis.  Wire was removed and hand-injection through the catheter verified intraluminal placement.  Magic torque wire was then reintroduced and the catheter removed.  A 6 mm x 60 mm life stent was then advanced across the lesion and deployed without difficulty.  It was postdilated with a 5 mm x 60 mm Lutonix drug-eluting balloon.  Inflation was to 8 atm for 1 minute.  Follow-up imaging demonstrated less than 5% residual stenosis.  After review of these images the sheath is pulled into the left external iliac oblique of the common femoral is obtained and a Star close device deployed. There no immediate complications.   Findings: The abdominal aorta is opacified with a bolus injection contrast. Renal arteries  are single and widely patent. The aorta itself has diffuse disease but no hemodynamically significant  lesions. The common and external iliac arteries are widely patent bilaterally.  The right common femoral is widely patent as is the profunda femoris.  The SFA does indeed have a significant stenosis of greater than 95% at Hunter's canal.  This appears to be a focal area measuring approximately 50 mm in length.  The distal popliteal demonstrates minimal disease and the trifurcation is patent with two-vessel runoff to the foot via the posterior tibial and anterior tibial.  The dorsalis pedis insert fairly small and appears to have some intrinsic disease.  The medial and lateral plantar vessels are quite robust and of the dominant filling of the pedal arch.  Following angioplasty and stent placement of the SFA there now is in-line flow and looks quite nice.  There is less than 5% residual stenosis.     Summary: Successful recanalization right lower extremity for limb salvage    Disposition: Patient was taken to the recovery room in stable condition having tolerated the procedure well.  Tkeya Stencil, Dolores Lory 11/11/2017,1:32 PM

## 2017-11-11 NOTE — H&P (Signed)
[facility] History & Physical Update  The patient was interviewed and re-examined.  The patient's previous History and Physical has been reviewed and is unchanged.  There is no change in the plan of care. We plan to proceed with the scheduled procedure.  Hortencia Pilar, MD  11/11/2017, 1:26 PM

## 2017-11-11 NOTE — Discharge Instructions (Signed)
[facility]   Angiogram, Care After This sheet gives you information about how to care for yourself after your procedure. Your doctor may also give you more specific instructions. If you have problems or questions, contact your doctor. Follow these instructions at home: Insertion site care  Follow instructions from your doctor about how to take care of your long, thin tube (catheter) insertion area. Make sure you: ? Wash your hands with soap and water before you change your bandage (dressing). If you cannot use soap and water, use hand sanitizer. ? Change your bandage as told by your doctor. ? Leave stitches (sutures), skin glue, or skin tape (adhesive) strips in place. They may need to stay in place for 2 weeks or longer. If tape strips get loose and curl up, you may trim the loose edges. Do not remove tape strips completely unless your doctor says it is okay.  Do not take baths, swim, or use a hot tub until your doctor says it is okay.  You may shower 24-48 hours after the procedure or as told by your doctor. ? Gently wash the area with plain soap and water. ? Pat the area dry with a clean towel. ? Do not rub the area. This may cause bleeding.  Do not apply powder or lotion to the area. Keep the area clean and dry.  Check your insertion area every day for signs of infection. Check for: ? More redness, swelling, or pain. ? Fluid or blood. ? Warmth. ? Pus or a bad smell. Activity  Rest as told by your doctor, usually for 1-2 days.  Do not lift anything that is heavier than 10 lbs. (4.5 kg) or as told by your doctor.  Do not drive for 24 hours if you were given a medicine to help you relax (sedative).  Do not drive or use heavy machinery while taking prescription pain medicine. General instructions  Go back to your normal activities as told by your doctor, usually in about a week. Ask your doctor what activities are safe for you.  If the insertion area  starts to bleed, lie flat and put pressure on the area. If the bleeding does not stop, get help right away. This is an emergency.  Drink enough fluid to keep your pee (urine) clear or pale yellow.  Take over-the-counter and prescription medicines only as told by your doctor.  Keep all follow-up visits as told by your doctor. This is important. Contact a doctor if:  You have a fever.  You have chills.  You have more redness, swelling, or pain around your insertion area.  You have fluid or blood coming from your insertion area.  The insertion area feels warm to the touch.  You have pus or a bad smell coming from your insertion area.  You have more bruising around the insertion area.  Blood collects in the tissue around the insertion area (hematoma) that may be painful to the touch. Get help right away if:  You have a lot of pain in the insertion area.  The insertion area swells very fast.  The insertion area is bleeding, and the bleeding does not stop after holding steady pressure on the area.  The area near or just beyond the insertion area becomes pale, cool, tingly, or numb. These symptoms may be an emergency. Do not wait to see if the symptoms will go away. Get medical help right away. Call your local emergency services (911 in the [place] Do  not drive yourself to the hospital. Summary  After the procedure, it is common to have bruising and tenderness at the long, thin tube insertion area.  After the procedure, it is important to rest and drink plenty of fluids.  Do not take baths, swim, or use a hot tub until your doctor says it is okay to do so. You may shower 24-48 hours after the procedure or as told by your doctor.  If the insertion area starts to bleed, lie flat and put pressure on the area. If the bleeding does not stop, get help right away. This is an emergency. This information is not intended to replace advice given to you by your health care provider. Make sure  you discuss any questions you have with your health care provider. Document Released: 06/26/2008 Document Revised: 03/24/2016 Document Reviewed: 03/24/2016 Elsevier Interactive Patient Education  2017 [employer].

## 2017-11-12 ENCOUNTER — Encounter: Payer: Self-pay | Admitting: Vascular Surgery

## 2017-11-15 ENCOUNTER — Telehealth: Payer: Self-pay | Admitting: Pharmacy Technician

## 2017-11-15 MED FILL — IBRANCE 75 MG CAPSULE: 75 | 28 days supply | Qty: 21 | Fill #2

## 2017-11-15 NOTE — Telephone Encounter (Signed)
Oral Oncology Patient Advocate Encounter  Patient has been approved for copay assistance with [employer] (TAF). [employer] [facility] will cover all copayment expenses for [facility] for the remainder of the calendar year (until 04/12/18).    The billing information is as follows and has been shared with [employer].   Member ID: [id number] Group ID: [id number] PCN: [id number] BIN: [place] Patient [place] Phone [phone] Fax [phone] 11/15/2017 11:38 AM

## 2017-11-18 ENCOUNTER — Inpatient Hospital Stay: Payer: Medicare Other | Attending: Oncology

## 2017-11-18 DIAGNOSIS — Z79899 Other long term (current) drug therapy: Secondary | ICD-10-CM | POA: Diagnosis not present

## 2017-11-18 DIAGNOSIS — Z79811 Long term (current) use of aromatase inhibitors: Secondary | ICD-10-CM | POA: Insufficient documentation

## 2017-11-18 DIAGNOSIS — Z170 Estrogen receptor positive status [ER+]: Secondary | ICD-10-CM | POA: Insufficient documentation

## 2017-11-18 DIAGNOSIS — C7951 Secondary malignant neoplasm of bone: Secondary | ICD-10-CM | POA: Insufficient documentation

## 2017-11-18 DIAGNOSIS — K760 Fatty (change of) liver, not elsewhere classified: Secondary | ICD-10-CM | POA: Diagnosis not present

## 2017-11-18 DIAGNOSIS — Z7901 Long term (current) use of anticoagulants: Secondary | ICD-10-CM | POA: Insufficient documentation

## 2017-11-18 DIAGNOSIS — C50912 Malignant neoplasm of unspecified site of left female breast: Secondary | ICD-10-CM | POA: Diagnosis not present

## 2017-11-18 DIAGNOSIS — I10 Essential (primary) hypertension: Secondary | ICD-10-CM | POA: Diagnosis not present

## 2017-11-18 DIAGNOSIS — I82411 Acute embolism and thrombosis of right femoral vein: Secondary | ICD-10-CM | POA: Insufficient documentation

## 2017-11-18 DIAGNOSIS — Z7983 Long term (current) use of bisphosphonates: Secondary | ICD-10-CM | POA: Diagnosis not present

## 2017-11-18 LAB — CBC WITH DIFFERENTIAL/PLATELET
BASOS PCT: 0 %
Basophils Absolute: 0 10*3/uL (ref 0–0.1)
EOS ABS: 0 10*3/uL (ref 0–0.7)
Eosinophils Relative: 1 %
HCT: 36.5 % (ref 35.0–47.0)
Hemoglobin: 12.7 g/dL (ref 12.0–16.0)
Lymphocytes Relative: 35 %
Lymphs Abs: 0.9 10*3/uL — ABNORMAL LOW (ref 1.0–3.6)
MCH: 36.8 pg — ABNORMAL HIGH (ref 26.0–34.0)
MCHC: 34.9 g/dL (ref 32.0–36.0)
MCV: 105.3 fL — ABNORMAL HIGH (ref 80.0–100.0)
MONO ABS: 0.2 10*3/uL (ref 0.2–0.9)
MONOS PCT: 9 %
Neutro Abs: 1.4 10*3/uL (ref 1.4–6.5)
Neutrophils Relative %: 55 %
Platelets: 244 10*3/uL (ref 150–440)
RBC: 3.46 MIL/uL — ABNORMAL LOW (ref 3.80–5.20)
RDW: 16.4 % — AB (ref 11.5–14.5)
WBC: 2.5 10*3/uL — ABNORMAL LOW (ref 3.6–11.0)

## 2017-11-18 LAB — COMPREHENSIVE METABOLIC PANEL
ALT: 19 U/L (ref 0–44)
ANION GAP: 13 (ref 5–15)
AST: 29 U/L (ref 15–41)
Albumin: 4.2 g/dL (ref 3.5–5.0)
Alkaline Phosphatase: 57 U/L (ref 38–126)
BUN: 12 mg/dL (ref 8–23)
CO2: 23 mmol/L (ref 22–32)
Calcium: 10.2 mg/dL (ref 8.9–10.3)
Chloride: 103 mmol/L (ref 98–111)
Creatinine, Ser: 0.99 mg/dL (ref 0.44–1.00)
GFR calc Af Amer: >60 mL/min (ref >60)
GFR calc non Af Amer: 55 mL/min — ABNORMAL LOW (ref >60)
Glucose, Bld: 124 mg/dL — ABNORMAL HIGH (ref 70–99)
POTASSIUM: 3.7 mmol/L (ref 3.5–5.1)
Sodium: 139 mmol/L (ref 135–145)
TOTAL PROTEIN: 7.5 g/dL (ref 6.5–8.1)
Total Bilirubin: 0.8 mg/dL (ref 0.3–1.2)

## 2017-11-19 LAB — CANCER ANTIGEN 27.29: CA 27.29: 87.9 U/mL — ABNORMAL HIGH (ref 0.0–38.6)

## 2017-11-23 ENCOUNTER — Encounter
Admission: RE | Admit: 2017-11-23 | Discharge: 2017-11-23 | Disposition: A | Discharge status: Home / Self Care | Payer: Medicare Other | Source: Ambulatory Visit | Attending: Oncology | Admitting: Oncology

## 2017-11-23 ENCOUNTER — Ambulatory Visit
Admission: RE | Admit: 2017-11-23 | Discharge: 2017-11-23 | Disposition: A | Discharge status: Home / Self Care | Payer: Medicare Other | Source: Ambulatory Visit | Attending: Oncology | Admitting: Oncology

## 2017-11-23 DIAGNOSIS — C50912 Malignant neoplasm of unspecified site of left female breast: Secondary | ICD-10-CM | POA: Insufficient documentation

## 2017-11-23 DIAGNOSIS — Z170 Estrogen receptor positive status [ER+]: Secondary | ICD-10-CM | POA: Diagnosis present

## 2017-11-23 DIAGNOSIS — C7951 Secondary malignant neoplasm of bone: Secondary | ICD-10-CM | POA: Insufficient documentation

## 2017-11-23 DIAGNOSIS — I700 Atherosclerosis of aorta: Secondary | ICD-10-CM | POA: Diagnosis not present

## 2017-11-23 DIAGNOSIS — Z95828 Presence of other vascular implants and grafts: Secondary | ICD-10-CM | POA: Insufficient documentation

## 2017-11-23 MED ORDER — TECHNETIUM TC 99M MEDRONATE IV KIT
23.0700 | PACK | Freq: Once | INTRAVENOUS | Status: AC | PRN
  Administered 2017-11-23: 23.07 via INTRAVENOUS

## 2017-11-23 MED ORDER — IOHEXOL 350 MG/ML SOLN
85.0000 mL | Freq: Once | INTRAVENOUS | Status: AC | PRN
  Administered 2017-11-23: 85 mL via INTRAVENOUS

## 2017-11-25 ENCOUNTER — Inpatient Hospital Stay: Payer: Medicare Other | Admitting: Oncology

## 2017-11-25 ENCOUNTER — Inpatient Hospital Stay: Payer: Medicare Other

## 2017-11-25 ENCOUNTER — Encounter: Payer: Self-pay | Admitting: Oncology

## 2017-11-25 VITALS — BP 149/81 | HR 89 | Temp 97.3°F | Resp 18 | Ht 62.0 in | Wt 198.0 lb

## 2017-11-25 DIAGNOSIS — C7951 Secondary malignant neoplasm of bone: Secondary | ICD-10-CM

## 2017-11-25 DIAGNOSIS — Z7983 Long term (current) use of bisphosphonates: Secondary | ICD-10-CM

## 2017-11-25 DIAGNOSIS — I82411 Acute embolism and thrombosis of right femoral vein: Secondary | ICD-10-CM | POA: Diagnosis not present

## 2017-11-25 DIAGNOSIS — C50912 Malignant neoplasm of unspecified site of left female breast: Secondary | ICD-10-CM | POA: Diagnosis not present

## 2017-11-25 DIAGNOSIS — Z170 Estrogen receptor positive status [ER+]: Secondary | ICD-10-CM

## 2017-11-25 DIAGNOSIS — Z7901 Long term (current) use of anticoagulants: Secondary | ICD-10-CM

## 2017-11-25 DIAGNOSIS — Z79811 Long term (current) use of aromatase inhibitors: Secondary | ICD-10-CM

## 2017-11-25 MED ORDER — HEPARIN SOD (PORK) LOCK FLUSH 100 UNIT/ML IV SOLN
INTRAVENOUS | Status: AC
  Filled 2017-11-25: qty 5

## 2017-11-25 MED ORDER — ZOLEDRONIC ACID 4 MG/100ML IV SOLN
4.0000 mg | Freq: Once | INTRAVENOUS | Status: AC
  Administered 2017-11-25: 4 mg via INTRAVENOUS
  Filled 2017-11-25: qty 100

## 2017-11-25 MED ORDER — HEPARIN SOD (PORK) LOCK FLUSH 100 UNIT/ML IV SOLN
500.0000 [IU] | Freq: Once | INTRAVENOUS | Status: AC | PRN
  Administered 2017-11-25: 500 [IU]

## 2017-11-25 MED ORDER — LIDOCAINE-PRILOCAINE 2.5-2.5 % EX CREA: 1.0000 "application " | TOPICAL_CREAM | CUTANEOUS | 0 refills | Status: DC | PRN

## 2017-11-25 MED ORDER — SODIUM CHLORIDE 0.9 % IV SOLN
INTRAVENOUS | Status: DC
  Administered 2017-11-25: 15:00:00 via INTRAVENOUS
  Filled 2017-11-25: qty 1000

## 2017-11-25 NOTE — Progress Notes (Signed)
No new changes noted today 

## 2017-11-26 NOTE — Progress Notes (Signed)
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 Patient is a 77 year old female with no significant comorbidities who noticed left breast mass about 6-9 months ago. She thought it would go awaybut it continued to increase in size and began to involve her skin. She also noted tenderness to palpation as well as intermittent sharp tingling pain. She was seen by Dr. Adora Fridge on 08/25/2016 and patient had a biopsy of her skin in his office which showed:DIAGNOSIS:  A. LEFT BREAST SKIN; EXCISION:  - INVASIVE CARCINOMA MORPHOLOGICALLY CONSISTENT WITH MAMMARY ORIGIN  INVOLVING THE DERMIS.   BREAST BIOMARKER TESTS  Estrogen Receptor (ER) Status: POSITIVE, >90%  Progesterone Receptor (PgR) Status: POSITIVE, >70%  Her2 negative  2. Patient underwent bilateral diagnostic mammogram on 08/31/2016 showed:IMPRESSION: 1. Known left breast cancer, retroareolar with probable extension to the nipple, measuring 4.3 cm greatest dimension by ultrasound, with associated architectural distortion and associated left nipple retraction, and with associated diffuse skin thickening on the left. 2. No enlarged or morphologically abnormal lymph nodes are seen in the left axilla by ultrasound. 3. No evidence of malignancy within the right breast.  3. Patient was also complaining of some perirectal pain and underwent CT abdomen pelvis with contrast on 09/02/2016 which showed: IMPRESSION: Left colonic diverticulosis. No active diverticulitis.Fatty liver. No acute findings or evidence  of metastatic disease in the abdomen or pelvis. Diffuse bony sclerotic metastases.  4. Other than that her breast pain patient overall feels well. Denies any unintentional weight loss. She continues to be active and care for her great-grandchildren. She has had a hysterectomy in the past. No family history of breast or ovarian cancer.  5. Given that she had inflammatory breast cancer- plan was to give 4 cycles of dose dense AC followed by possible mastectomy and treat bone mets with AI + ibrance  6. PET CT scan on 09/11/16 showed: IMPRESSION: 1. Low-grade but abnormal hypermetabolic activity associated with the cutaneous and sub areolar glandular tissues of the left breast, compatible with malignancy, representative SUV 3.9 compared to contralateral normal side 1.4. 2. Diffuse sclerotic osseous metastatic disease with low-grade hypermetabolic activity, a representative SUV along the left sacrum 5.4. 3. Other imaging findings of potential clinical significance: Aortic Atherosclerosis (ICD10-I70.0). Descending and sigmoid colon Diverticulosis.  7. Baseline MUGA scan showed EF of 71%. Baseline CA 27.29 elevated at 563.2  8. 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  9. Letrozole and ibrance started in sept 2018. zometa monthly also started. Baseline bone density scan normal  10. ibrance held in oct 2018 after cycle 1 due to Grade 3 neutropenia and thrombocytopenia.cycle 2 restaretd on 02/04/17 at 100 mg. Patient continued to have prolongeed grade 3 neutropenia and dose lowered to 75 mg  11. Patient had extensive RLE DVT in April 2019 and is currently on eliquis. She also underwent venous thrombectomy and thrombolytic therapy and IVC placement by Dr. Lucky Cowboy.  She also has peripheral vascular disease secondary to atherosclerosis and underwent stenting with vascular surgery as well   Interval history-overall she is tolerating Ibrance and letrozole well and does not  report any significant side effects.  She continues to have some pain and swelling in her right foot especially in her right great toe.  She was noted to have some claudication symptoms and was seen by vascular surgery recently and underwent angioplasty and stent placement in the femoral and popliteal artery.  ECOG PS- 1 Pain scale- 3 Opioid associated constipation- no  Review of systems- Review of Systems  Constitutional: Positive for malaise/fatigue. Negative for chills, fever and weight loss.  HENT: Negative for congestion, ear discharge and nosebleeds.   Eyes: Negative for blurred vision.  Respiratory: Negative for cough, hemoptysis, sputum production, shortness of breath and wheezing.   Cardiovascular: Negative for chest pain, palpitations, orthopnea and claudication.  Gastrointestinal: Negative for abdominal pain, blood in stool, constipation, diarrhea, heartburn, melena, nausea and vomiting.  Genitourinary: Negative for dysuria, flank pain, frequency, hematuria and urgency.  Musculoskeletal: Negative for back pain, joint pain and myalgias.       Pain in the right leg and toes  Skin: Negative for rash.  Neurological: Negative for dizziness, tingling, focal weakness, seizures, weakness and headaches.  Endo/Heme/Allergies: Does not bruise/bleed easily.  Psychiatric/Behavioral: Negative for depression and suicidal ideas. The patient does not have insomnia.        No Known Allergies   Past Medical History:  Diagnosis Date  . Breast cancer ([place])   . Cancer ([place])    left breast  . DVT (deep venous thrombosis) ([place])    2019  . Hypertension   . Port catheter in place 09/14/2016   Placed 09/10/2016 RT chest.     Past Surgical History:  Procedure Laterality Date  . ABDOMINAL HYSTERECTOMY    . APPENDECTOMY    . BREAST BIOPSY    . CYST EXCISION Right 09/2016   Buttocks  . LOWER EXTREMITY ANGIOGRAPHY Right 11/11/2017   Procedure: LOWER EXTREMITY ANGIOGRAPHY;  Surgeon: Katha Cabal, MD;  Location: [facility];  Service: Cardiovascular;  Laterality: Right;  . PERIPHERAL VASCULAR THROMBECTOMY Right 08/30/2017   Procedure: PERIPHERAL VASCULAR THROMBECTOMY;  Surgeon: Algernon Huxley, MD;  Location: [facility];  Service: Cardiovascular;  Laterality: Right;  . PORTACATH PLACEMENT Right 09/10/2016   Procedure: INSERTION PORT-A-CATH;  Surgeon: Jules Husbands, MD;  Location: [facility];  Service: General;  Laterality: Right;    Social History   Socioeconomic History  . Marital status: Married    Spouse name: Jeneen Rinks  . Number of children: 2  . Years of education: 6  . Highest education level: Bachelor's degree (e.g., BA, AB, BS)  Occupational History  . 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[employer]  Social Needs  . Financial resource strain: 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  . Food insecurity:    Worry: Never true    Inability: Never true  . Transportation needs:    Medical: No    Non-medical: No  Tobacco Use  . Smoking status: Never Smoker  . Smokeless tobacco: Never Used  Substance and Sexual Activity  . Alcohol use: No  . Drug use: No  . Sexual activity: Never  Lifestyle  . Physical activity:    Days per week: Patient refused    Minutes per session: Patient refused  . Stress: Not at all  Relationships  . Social connections:    Talks on phone: More than three times a week    Gets together: Once a week    Attends religious service: More than 4 times per year  Active member of club or organization: No    Attends meetings of clubs or organizations: Never    Relationship status: Not on file  . Intimate partner violence:    Fear of current or ex partner: Not on file    Emotionally abused: Not on file    Physically abused: Not on file    Forced sexual activity: Not on file  Other Topics Concern  . Not on file  Social History Narrative  . Not on file    Family History  Problem Relation Age of Onset  . Parkinson's disease  Mother   . Bladder Cancer Father   . Lung cancer Father      Current Outpatient Medications:  .  amLODipine (NORVASC) 10 MG tablet, Take 10 mg by mouth daily., Disp: , Rfl:  .  apixaban (ELIQUIS) 5 MG TABS tablet, Take 1 tablet (5 mg total) by mouth 2 (two) times daily., Disp: 60 tablet, Rfl: 1 .  aspirin EC 81 MG tablet, Take 1 tablet (81 mg total) by mouth daily., Disp: 150 tablet, Rfl: 2 .  Calcium Carb-Cholecalciferol (CALCIUM 600/VITAMIN D3 PO), Take 1 tablet by mouth daily., Disp: , Rfl:  .  hydrochlorothiazide (MICROZIDE) 12.5 MG capsule, Take 12.5 mg by mouth daily. , Disp: , Rfl:  .  IBRANCE 75 MG capsule, TAKE 1 CAPSULE (75 MG TOTAL) BY MOUTH DAILY WITH BREAKFAST. TAKE FOR 21 DAYS ON, THEN 7 DAYS OFF., Disp: 21 capsule, Rfl: 3 .  letrozole (FEMARA) 2.5 MG tablet, Take 1 tablet (2.5 mg total) by mouth daily., Disp: 90 tablet, Rfl: 3 .  PREVIDENT 5000 BOOSTER PLUS 1.1 % PSTE, Place 1 application onto teeth 2 (two) times daily. , Disp: , Rfl:  .  Vitamin D, Cholecalciferol, 400 units CAPS, Take 400 Units by mouth daily. , Disp: , Rfl:  .  acetaminophen (TYLENOL) 500 MG tablet, Take 500 mg by mouth daily as needed for moderate pain or headache. , Disp: , Rfl:  .  lidocaine-prilocaine (EMLA) cream, Apply 1 application topically as needed (port access)., Disp: 30 g, Rfl: 0 .  oxyCODONE-acetaminophen (PERCOCET/ROXICET) 5-325 MG tablet, Take 1 tablet by mouth every 6 (six) hours as needed for moderate pain or severe pain. (Patient not taking: Reported on 11/25/2017), Disp: 28 tablet, Rfl: 0 .  prochlorperazine (COMPAZINE) 10 MG tablet, Take 1 tablet (10 mg total) by mouth every 6 (six) hours as needed (Nausea or vomiting). (Patient not taking: Reported on 11/25/2017), Disp: 30 tablet, Rfl: 1  Physical exam:  Vitals:   11/25/17 1437  BP: (!) 149/81  Pulse: 89  Resp: 18  Temp: (!) 97.3 F (36.3 C)  TempSrc: Tympanic  SpO2: 97%  Weight: 198 lb (89.8 kg)  Height: '5\' 2"'$  (1.575 m)    Physical Exam  Constitutional: She is oriented to person, place, and time. She appears well-developed and well-nourished.  HENT:  Head: Normocephalic and atraumatic.  Mouth/Throat: Oropharynx is clear and moist.  Eyes: Pupils are equal, round, and reactive to light. EOM are normal.  Neck: Normal range of motion.  Cardiovascular: Normal rate, regular rhythm and normal heart sounds.  Pulmonary/Chest: Effort normal and breath sounds normal.  Abdominal: Soft. Bowel sounds are normal.  Musculoskeletal:  Right great toe appears to have a bluish discoloration  Neurological: She is alert and oriented to person, place, and time.  Skin: Skin is warm and dry.     CMP Latest Ref Rng & Units 11/18/2017  Glucose 70 - 99 mg/dL 124(H)  BUN 8 - 23 mg/dL 12  Creatinine 0.44 - 1.00 mg/dL 0.99  Sodium 135 - 145 mmol/L 139  Potassium 3.5 - 5.1 mmol/L 3.7  Chloride 98 - 111 mmol/L 103  CO2 22 - 32 mmol/L 23  Calcium 8.9 - 10.3 mg/dL 10.2  Total Protein 6.5 - 8.1 g/dL 7.5  Total Bilirubin 0.3 - 1.2 mg/dL 0.8  Alkaline Phos 38 - 126 U/L 57  AST 15 - 41 U/L 29  ALT 0 - 44 U/L 19   CBC Latest Ref Rng & Units 11/18/2017  WBC 3.6 - 11.0 K/uL 2.5(L)  Hemoglobin 12.0 - 16.0 g/dL 12.7  Hematocrit 35.0 - 47.0 % 36.5  Platelets 150 - 440 K/uL 244    No images are attached to the encounter.  Ct Chest W Contrast  Addendum Date: 11/23/2017   ADDENDUM REPORT: 11/23/2017 13:56 ADDENDUM: As mentioned in the body of the report, there is a tiny gas bubble in the bladder lumen. This is likely related to recent instrumentation although bladder infection could have this appearance. Electronically Signed   By: Misty Stanley M.D.   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 EXAM: CT CHEST, ABDOMEN, AND PELVIS WITH CONTRAST TECHNIQUE: Multidetector CT imaging of the chest, abdomen and pelvis was performed following the standard protocol during bolus  administration of intravenous contrast. CONTRAST:  45m OMNIPAQUE IOHEXOL 350 MG/ML SOLN COMPARISON:  05/14/2017 FINDINGS: CT CHEST FINDINGS Cardiovascular: Heart size upper normal. No pericardial effusion. Atherosclerotic calcification is noted in the wall of the thoracic aorta. Right Port-A-Cath tip positioned at the SVC/RA junction. Mediastinum/Nodes: No mediastinal lymphadenopathy. There is no hilar lymphadenopathy. The esophagus has normal imaging features. There is no axillary lymphadenopathy. Lungs/Pleura: The central tracheobronchial airways are patent. No suspicious pulmonary nodule or mass. No focal airspace consolidation. No pulmonary edema or pleural effusion. Musculoskeletal: Similar appearance of diffuse bony sclerosis consistent with widespread osseous metastatic involvement. CT ABDOMEN PELVIS FINDINGS Hepatobiliary: No focal abnormality within the liver parenchyma. There is no evidence for gallstones, gallbladder wall thickening, or pericholecystic fluid. No intrahepatic or extrahepatic biliary dilation. Pancreas: No focal mass lesion. No dilatation of the main duct. No intraparenchymal cyst. No peripancreatic edema. Spleen: No splenomegaly. No focal mass lesion. Adrenals/Urinary Tract: No adrenal nodule or mass. Right kidney unremarkable. 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 Confluence of the 2 left ureters not definitely demonstrated on this study. No evidence for hydroureter. Bladder unremarkable aside from a tiny gas bubble in the non dependent lumen. Stomach/Bowel: Stomach is nondistended. No gastric wall thickening. No evidence of outlet obstruction. Duodenum is normally positioned as is the ligament of Treitz. Large duodenal diverticulum noted near the ligament of Treitz. No small bowel wall thickening. No small bowel dilatation. The terminal ileum is normal. The appendix is not visualized, but there is no edema  or inflammation in the region of the cecum. Diverticular changes are noted in the left colon without evidence of diverticulitis. Vascular/Lymphatic: There is abdominal aortic atherosclerosis without aneurysm. IVC filter is new in the interval. There is no gastrohepatic or hepatoduodenal ligament lymphadenopathy. No intraperitoneal or retroperitoneal lymphadenopathy. No pelvic sidewall lymphadenopathy. Reproductive: Uterus surgically absent.  There is no adnexal mass. Other: No intraperitoneal free fluid. Musculoskeletal: Diffuse bony metastatic disease evident. IMPRESSION: 1. Stable exam. Widespread sclerotic bony metastases without new or progressive findings to suggest new soft tissue metastases. 2.  Aortic Atherosclerois (ICD10-170.0) 3. Interval placement of IVC filter. Electronically Signed: By: Misty Stanley M.D. On: 11/23/2017 13:37   Nm Bone Scan Whole Body  Result Date: 11/24/2017 CLINICAL DATA:  Left breast cancer restaging EXAM: NUCLEAR MEDICINE WHOLE BODY BONE SCAN TECHNIQUE: Whole body anterior and posterior images were obtained approximately 3 hours after intravenous injection of radiopharmaceutical. RADIOPHARMACEUTICALS:  23.1 mCi Technetium-64mMDP IV COMPARISON:  05/14/2017 FINDINGS: The patient has known chronic diffuse sclerotic osseous metastatic disease based on CT examinations and imaging history. As on 05/14/2017, the bone scan is surprisingly negative throughout much of the visualized skeleton. Questionable accentuated activity in the sternal manubrium and portions of the spine. Degenerative activity primarily in the right knee and ankles. Renal activity is visible bilaterally, and accordingly today's appearance is not classically a "super scan." IMPRESSION: 1. As on the prior examination of 05/14/2017, the bone scan vastly underestimates the widespread sclerotic metastatic disease shown on CT. There is potentially some accentuated activity along the sternal manubrium and perhaps portions  of the spine, much of the sclerotic disease shown on CT is not readily apparent on bone scan. 2. Degenerative findings of the right knee and both ankles. Electronically Signed   By: WVan ClinesM.D.   On: 11/24/2017 08:13   Ct Abdomen Pelvis W Contrast  Addendum Date: 11/23/2017   ADDENDUM REPORT: 11/23/2017 13:56 ADDENDUM: As mentioned in the body of the report, there is a tiny gas bubble in the bladder lumen. This is likely related to recent instrumentation although bladder infection could have this appearance. Electronically Signed   By: EMisty StanleyM.D.   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 EXAM: CT CHEST, ABDOMEN, AND PELVIS WITH CONTRAST TECHNIQUE: Multidetector CT imaging of the chest, abdomen and pelvis was performed following the standard protocol during bolus administration of intravenous contrast. CONTRAST:  874mOMNIPAQUE IOHEXOL 350 MG/ML SOLN COMPARISON:  05/14/2017 FINDINGS: CT CHEST FINDINGS Cardiovascular: Heart size upper normal. No pericardial effusion. Atherosclerotic calcification is noted in the wall of the thoracic aorta. Right Port-A-Cath tip positioned at the SVC/RA junction. Mediastinum/Nodes: No mediastinal lymphadenopathy. There is no hilar lymphadenopathy. The esophagus has normal imaging features. There is no axillary lymphadenopathy. Lungs/Pleura: The central tracheobronchial airways are patent. No suspicious pulmonary nodule or mass. No focal airspace consolidation. No pulmonary edema or pleural effusion. Musculoskeletal: Similar appearance of diffuse bony sclerosis consistent with widespread osseous metastatic involvement. CT ABDOMEN PELVIS FINDINGS Hepatobiliary: No focal abnormality within the liver parenchyma. There is no evidence for gallstones, gallbladder wall thickening, or pericholecystic fluid. No intrahepatic or extrahepatic biliary dilation. Pancreas: No focal mass lesion. No dilatation  of the main duct. No intraparenchymal cyst. No peripancreatic edema. Spleen: No splenomegaly. No focal mass lesion. Adrenals/Urinary Tract: No adrenal nodule or mass. Right kidney unremarkable. 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 Confluence of the 2 left ureters not definitely demonstrated on this study. No evidence for hydroureter. Bladder unremarkable aside from a tiny gas bubble in the non dependent lumen. Stomach/Bowel: Stomach is nondistended. No gastric wall thickening. No evidence of outlet obstruction. Duodenum is normally positioned as is the ligament of Treitz. Large duodenal diverticulum noted near the ligament of Treitz. No small bowel wall thickening. No small bowel dilatation. The terminal ileum is normal. The appendix is not visualized, but there is no edema or inflammation in the region of the cecum.  Diverticular changes are noted in the left colon without evidence of diverticulitis. Vascular/Lymphatic: There is abdominal aortic atherosclerosis without aneurysm. IVC filter is new in the interval. There is no gastrohepatic or hepatoduodenal ligament lymphadenopathy. No intraperitoneal or retroperitoneal lymphadenopathy. No pelvic sidewall lymphadenopathy. Reproductive: Uterus surgically absent.  There is no adnexal mass. Other: No intraperitoneal free fluid. Musculoskeletal: Diffuse bony metastatic disease evident. IMPRESSION: 1. Stable exam. Widespread sclerotic bony metastases without new or progressive findings to suggest new soft tissue metastases. 2.  Aortic Atherosclerois (ICD10-170.0) 3. Interval placement of IVC filter. Electronically Signed: By: Misty Stanley M.D. On: 11/23/2017 13:37     Assessment and plan- Patient is a 77 y.o. female Stage IV ER PR positive her 2 negative breast cancer with bone only mets who is here for routine f/u on ibrance and letrozole  I have reviewed CT chest abdomen pelvis as  well as bone scan images independently and discussed findings with the patient.  She has metastatic breast cancer with bone only mets which are overall stable.  Her CA-27-29 is currently between the 70s to 81s and is remained stable.  She will continue Ibrance and letrozole at this time.  She started her next cycle of Ibrance on 11/19/2017.  She will come for a repeat labs only visit on 12/15/2017 and if labs are okay she will proceed with the next cycle of Ibrance on 12/17/2017.  I will see her on 01/13/2018 prior to the next cycle of Ibrance with CBC CMP and CA-27-29.  She will get Delton See today and in October 2019 followed by monthly.  She will also continue her letrozole every day along with calcium and vitamin D  She will continue to follow-up with vascular surgery for her peripheral vascular disease and continue to remain on Eliquis for her right lower extremity DVT.   Visit Diagnosis 1. Malignant neoplasm of left breast in female, estrogen receptor positive, unspecified site of breast ([place])      Dr. Randa Evens, MD, MPH [facility] [phone] 11/26/2017 1:21 PM

## 2017-12-03 ENCOUNTER — Other Ambulatory Visit (INDEPENDENT_AMBULATORY_CARE_PROVIDER_SITE_OTHER): Payer: Self-pay | Admitting: Nurse Practitioner

## 2017-12-14 MED FILL — IBRANCE 75 MG CAPSULE: 75 | 28 days supply | Qty: 21 | Fill #3

## 2017-12-15 ENCOUNTER — Inpatient Hospital Stay: Payer: Medicare Other | Attending: Oncology

## 2017-12-15 DIAGNOSIS — C7951 Secondary malignant neoplasm of bone: Secondary | ICD-10-CM | POA: Insufficient documentation

## 2017-12-15 DIAGNOSIS — C50912 Malignant neoplasm of unspecified site of left female breast: Secondary | ICD-10-CM

## 2017-12-15 DIAGNOSIS — C50919 Malignant neoplasm of unspecified site of unspecified female breast: Secondary | ICD-10-CM | POA: Diagnosis not present

## 2017-12-15 DIAGNOSIS — Z170 Estrogen receptor positive status [ER+]: Secondary | ICD-10-CM

## 2017-12-15 LAB — CBC WITH DIFFERENTIAL/PLATELET
BASOS ABS: 0.1 10*3/uL (ref 0–0.1)
Basophils Relative: 2 %
Eosinophils Absolute: 0 10*3/uL (ref 0–0.7)
Eosinophils Relative: 1 %
HEMATOCRIT: 36.1 % (ref 35.0–47.0)
Hemoglobin: 12.6 g/dL (ref 12.0–16.0)
LYMPHS ABS: 0.9 10*3/uL — AB (ref 1.0–3.6)
LYMPHS PCT: 33 %
MCH: 36.6 pg — AB (ref 26.0–34.0)
MCHC: 34.8 g/dL (ref 32.0–36.0)
MCV: 105 fL — AB (ref 80.0–100.0)
MONO ABS: 0.2 10*3/uL (ref 0.2–0.9)
MONOS PCT: 9 %
NEUTROS ABS: 1.5 10*3/uL (ref 1.4–6.5)
Neutrophils Relative %: 55 %
Platelets: 238 10*3/uL (ref 150–440)
RBC: 3.43 MIL/uL — ABNORMAL LOW (ref 3.80–5.20)
RDW: 16.1 % — AB (ref 11.5–14.5)
WBC: 2.7 10*3/uL — ABNORMAL LOW (ref 3.6–11.0)

## 2017-12-15 LAB — COMPREHENSIVE METABOLIC PANEL
ALBUMIN: 4.3 g/dL (ref 3.5–5.0)
ALT: 28 U/L (ref 0–44)
ANION GAP: 10 (ref 5–15)
AST: 31 U/L (ref 15–41)
Alkaline Phosphatase: 58 U/L (ref 38–126)
BUN: 11 mg/dL (ref 8–23)
CHLORIDE: 101 mmol/L (ref 98–111)
CO2: 26 mmol/L (ref 22–32)
Calcium: 10.3 mg/dL (ref 8.9–10.3)
Creatinine, Ser: 0.82 mg/dL (ref 0.44–1.00)
GFR calc Af Amer: >60 mL/min (ref >60)
GFR calc non Af Amer: >60 mL/min (ref >60)
GLUCOSE: 125 mg/dL — AB (ref 70–99)
POTASSIUM: 3.5 mmol/L (ref 3.5–5.1)
SODIUM: 137 mmol/L (ref 135–145)
Total Bilirubin: 1 mg/dL (ref 0.3–1.2)
Total Protein: 7.4 g/dL (ref 6.5–8.1)

## 2017-12-16 ENCOUNTER — Other Ambulatory Visit: Payer: Self-pay | Admitting: *Deleted

## 2017-12-16 LAB — CANCER ANTIGEN 27.29: CAN 27.29: 96.2 U/mL — AB (ref 0.0–38.6)

## 2017-12-16 MED ORDER — APIXABAN 5 MG PO TABS: 5.0000 mg | ORAL_TABLET | Freq: Two times a day (BID) | ORAL | 2 refills | Status: DC

## 2017-12-21 ENCOUNTER — Other Ambulatory Visit (INDEPENDENT_AMBULATORY_CARE_PROVIDER_SITE_OTHER): Payer: Self-pay | Admitting: Vascular Surgery

## 2017-12-21 ENCOUNTER — Ambulatory Visit (INDEPENDENT_AMBULATORY_CARE_PROVIDER_SITE_OTHER): Payer: Medicare Other | Admitting: Vascular Surgery

## 2017-12-21 ENCOUNTER — Encounter (INDEPENDENT_AMBULATORY_CARE_PROVIDER_SITE_OTHER): Payer: Self-pay | Admitting: Vascular Surgery

## 2017-12-21 ENCOUNTER — Ambulatory Visit (INDEPENDENT_AMBULATORY_CARE_PROVIDER_SITE_OTHER): Payer: Medicare Other

## 2017-12-21 ENCOUNTER — Encounter

## 2017-12-21 VITALS — BP 158/78 | HR 91 | Resp 14 | Ht 62.0 in | Wt 199.0 lb

## 2017-12-21 DIAGNOSIS — C7951 Secondary malignant neoplasm of bone: Secondary | ICD-10-CM | POA: Diagnosis not present

## 2017-12-21 DIAGNOSIS — C50912 Malignant neoplasm of unspecified site of left female breast: Secondary | ICD-10-CM | POA: Diagnosis not present

## 2017-12-21 DIAGNOSIS — Z95828 Presence of other vascular implants and grafts: Secondary | ICD-10-CM | POA: Diagnosis not present

## 2017-12-21 DIAGNOSIS — I739 Peripheral vascular disease, unspecified: Secondary | ICD-10-CM

## 2017-12-21 DIAGNOSIS — I82511 Chronic embolism and thrombosis of right femoral vein: Secondary | ICD-10-CM

## 2017-12-21 DIAGNOSIS — Z95820 Peripheral vascular angioplasty status with implants and grafts: Secondary | ICD-10-CM | POA: Diagnosis not present

## 2017-12-21 DIAGNOSIS — Z170 Estrogen receptor positive status [ER+]: Secondary | ICD-10-CM

## 2017-12-21 DIAGNOSIS — I10 Essential (primary) hypertension: Secondary | ICD-10-CM

## 2017-12-21 NOTE — Assessment & Plan Note (Signed)
ABIs were 0.90 on the right 0.82 on the left.  The digital waveforms are reduced, but she has normal triphasic waveforms at the ankle.  She is doing better after revascularization a month ago.  Continue current medical regimen.  Plan to recheck arterial perfusion in 6 months with ABIs

## 2017-12-21 NOTE — Progress Notes (Signed)
MRN : [id number]  Victoria Steele is a 77 y.o. (21-Jan-1941) female who presents with chief complaint of  Chief Complaint  Patient presents with  . Follow-up    2 week abi  .  History of Present Illness: Patient returns today in follow up of multiple vascular issues.  She underwent right lower extremity revascularization for peripheral arterial disease by Dr. Delana Meyer about a month ago.  She had no periprocedural complications.  She did well and her ABIs today are 0.90 on the right and 0.80 on the left.  Her digital pressures are reduced but her waveforms are triphasic at the ankles. He also underwent treatment for DVT about 4 months ago.  She really has minimal swelling at this time in the right leg.  No chest pain or shortness of breath.  She is scheduled to have her filter removed in about a month.  Current Outpatient Medications  Medication Sig Dispense Refill  . acetaminophen (TYLENOL) 500 MG tablet Take 500 mg by mouth daily as needed for moderate pain or headache.     Marland Kitchen amLODipine (NORVASC) 10 MG tablet Take 10 mg by mouth daily.    Marland Kitchen apixaban (ELIQUIS) 5 MG TABS tablet Take 1 tablet (5 mg total) by mouth 2 (two) times daily. 60 tablet 2  . aspirin EC 81 MG tablet Take 1 tablet (81 mg total) by mouth daily. 150 tablet 2  . Calcium Carb-Cholecalciferol (CALCIUM 600/VITAMIN D3 PO) Take 1 tablet by mouth daily.    . hydrochlorothiazide (MICROZIDE) 12.5 MG capsule Take 12.5 mg by mouth daily.     Leslee Home 75 MG capsule TAKE 1 CAPSULE (75 MG TOTAL) BY MOUTH DAILY WITH BREAKFAST. TAKE FOR 21 DAYS ON, THEN 7 DAYS OFF. 21 capsule 3  . letrozole (FEMARA) 2.5 MG tablet Take 1 tablet (2.5 mg total) by mouth daily. 90 tablet 3  . lidocaine-prilocaine (EMLA) cream Apply 1 application topically as needed (port access). 30 g 0  . oxyCODONE-acetaminophen (PERCOCET/ROXICET) 5-325 MG tablet Take 1 tablet by mouth every 6 (six) hours as needed for moderate pain or severe pain. (Patient not taking:  Reported on 11/25/2017) 28 tablet 0  . PREVIDENT 5000 BOOSTER PLUS 1.1 % PSTE Place 1 application onto teeth 2 (two) times daily.     . prochlorperazine (COMPAZINE) 10 MG tablet Take 1 tablet (10 mg total) by mouth every 6 (six) hours as needed (Nausea or vomiting). (Patient not taking: Reported on 11/25/2017) 30 tablet 1  . Vitamin D, Cholecalciferol, 400 units CAPS Take 400 Units by mouth daily.      No current facility-administered medications for this visit.     Past Medical History:  Diagnosis Date  . Breast cancer ([place])   . Cancer ([place])    left breast  . DVT (deep venous thrombosis) ([place])    2019  . Hypertension   . Port catheter in place 09/14/2016   Placed 09/10/2016 RT chest.    Past Surgical History:  Procedure Laterality Date  . ABDOMINAL HYSTERECTOMY    . APPENDECTOMY    . BREAST BIOPSY    . CYST EXCISION Right 09/2016   Buttocks  . LOWER EXTREMITY ANGIOGRAPHY Right 11/11/2017   Procedure: LOWER EXTREMITY ANGIOGRAPHY;  Surgeon: Katha Cabal, MD;  Location: [facility];  Service: Cardiovascular;  Laterality: Right;  . PERIPHERAL VASCULAR THROMBECTOMY Right 08/30/2017   Procedure: PERIPHERAL VASCULAR THROMBECTOMY;  Surgeon: Algernon Huxley, MD;  Location: [facility];  Service:  Cardiovascular;  Laterality: Right;  . PORTACATH PLACEMENT Right 09/10/2016   Procedure: INSERTION PORT-A-CATH;  Surgeon: Jules Husbands, MD;  Location: [facility];  Service: General;  Laterality: Right;    Social History Social History   Tobacco Use  . Smoking status: Never Smoker  . Smokeless tobacco: Never Used  Substance Use Topics  . Alcohol use: No  . Drug use: No     Family History Family History  Problem Relation Age of Onset  . Parkinson's disease Mother   . Bladder Cancer Father   . Lung cancer Father     No Known Allergies   REVIEW OF SYSTEMS (Negative unless checked)  Constitutional: _0 Weight loss  _1 Fever  _2 Chills Cardiac: _3 Chest pain   _4 Chest  pressure   _5 Palpitations   _6 Shortness of breath when laying flat   _7 Shortness of breath at rest   _8 Shortness of breath with exertion. Vascular:  _9 Pain in legs with walking   _10 Pain in legs at rest   _11 Pain in legs when laying flat   _12 Claudication   _13 Pain in feet when walking  _14 Pain in feet at rest  _15 Pain in feet when laying flat   _16 History of DVT   _17 Phlebitis   _18 Swelling in legs   _19 Varicose veins   _20 Non-healing ulcers Pulmonary:   _21 Uses home oxygen   _22 Productive cough   _23 Hemoptysis   _24 Wheeze  _25 COPD   _26 Asthma Neurologic:  _27 Dizziness  _28 Blackouts   _29 Seizures   _30 History of stroke   _31 History of TIA  _32 Aphasia   _33 Temporary blindness   _34 Dysphagia   _35 Weakness or numbness in arms   _36 Weakness or numbness in legs Musculoskeletal:  _37 Arthritis   _38 Joint swelling   _39 Joint pain   _40 Low back pain Hematologic:  _41 Easy bruising  _42 Easy bleeding   _43 Hypercoagulable state   _44 Anemic  _45 Hepatitis Gastrointestinal:  _46 Blood in stool   _47 Vomiting blood  _48 Gastroesophageal reflux/heartburn   _49 Abdominal pain Genitourinary:  _50 Chronic kidney disease   _51 Difficult urination  _52 Frequent urination  _53 Burning with urination   _54 Hematuria Skin:  _55 Rashes   _56 Ulcers   _57 Wounds Psychological:  _58 History of anxiety   _59  History of major depression.  Physical Examination  BP (!) 158/78 (BP Location: Right Arm, Patient Position: Sitting)   Pulse 91   Resp 14   Ht _60  (1.575 m)   Wt 199 lb (90.3 kg)   BMI 36.40 kg/m  Gen:  WD/WN, NAD Head: [place]/AT, No temporalis wasting. Ear/Nose/Throat: Hearing grossly intact, nares w/o erythema or drainage Eyes: Conjunctiva clear. Sclera non-icteric Neck: Supple.  Trachea midline Pulmonary:  Good air movement, no use of accessory muscles.  Cardiac: RRR, no JVD Vascular:  Vessel Right Left  Radial Palpable Palpable                          PT Palpable Palpable  DP Palpable Palpable    Musculoskeletal: M/S 5/5 throughout.  No deformity or  atrophy. Trace RLE edema. Neurologic: Sensation grossly intact in extremities.  Symmetrical.  Speech is fluent.  Psychiatric: Judgment intact, Mood & affect appropriate for pt's clinical situation. Dermatologic: No rashes or ulcers noted.  No cellulitis or open wounds.       Labs Recent Results (from the past 2160 hour(s))  Cancer antigen 27.29     Status: Abnormal   Collection Time: 09/30/17  1:54 PM  Result Value Ref Range   CA 27.29 90.8 (H) 0.0 - 38.6 U/mL    Comment: (  NOTE) Siemens Centaur Immunochemiluminometric Methodology [facility]) Values obtained with different assay methods or kits cannot be used interchangeably. Results cannot be interpreted as absolute evidence of the presence or absence of malignant disease. Performed At: [facility] [place], [place] [id number] Rush Farmer MD [id number] Performed at [facility], [address] [place], [place] [address]   Comprehensive metabolic panel     Status: Abnormal   Collection Time: 09/30/17  1:54 PM  Result Value Ref Range   Sodium 138 135 - 145 mmol/L   Potassium 3.7 3.5 - 5.1 mmol/L   Chloride 104 101 - 111 mmol/L   CO2 24 22 - 32 mmol/L   Glucose, Bld 111 (H) 65 - 99 mg/dL   BUN 11 6 - 20 mg/dL   Creatinine, Ser 0.85 0.44 - 1.00 mg/dL   Calcium 10.2 8.9 - 10.3 mg/dL   Total Protein 7.2 6.5 - 8.1 g/dL   Albumin 4.3 3.5 - 5.0 g/dL   AST 29 15 - 41 U/L   ALT 24 14 - 54 U/L   Alkaline Phosphatase 62 38 - 126 U/L   Total Bilirubin 1.0 0.3 - 1.2 mg/dL   GFR calc non Af Amer >60 >60 mL/min   GFR calc Af Amer >60 >60 mL/min    Comment: (NOTE) The eGFR has been calculated using the CKD EPI equation. This calculation has not been validated in all clinical situations. eGFR's persistently <60 mL/min signify possible Chronic Kidney Disease.    Anion gap 10 5 - 15    Comment: Performed at [facility], [address] [place], [place] [address]  CBC with Differential/Platelet      Status: Abnormal   Collection Time: 09/30/17  1:54 PM  Result Value Ref Range   WBC 2.8 (L) 3.6 - 11.0 K/uL   RBC 3.36 (L) 3.80 - 5.20 MIL/uL   Hemoglobin 12.2 12.0 - 16.0 g/dL   HCT 34.7 (L) 35.0 - 47.0 %   MCV 103.1 (H) 80.0 - 100.0 fL   MCH 36.4 (H) 26.0 - 34.0 pg   MCHC 35.3 32.0 - 36.0 g/dL   RDW 16.7 (H) 11.5 - 14.5 %   Platelets 309 150 - 440 K/uL   Neutrophils Relative % 62 %   Neutro Abs 1.7 1.4 - 6.5 K/uL   Lymphocytes Relative 28 %   Lymphs Abs 0.8 (L) 1.0 - 3.6 K/uL   Monocytes Relative 5 %   Monocytes Absolute 0.1 (L) 0.2 - 0.9 K/uL   Eosinophils Relative 2 %   Eosinophils Absolute 0.0 0 - 0.7 K/uL   Basophils Relative 3 %   Basophils Absolute 0.1 0 - 0.1 K/uL    Comment: Performed at [facility], [address] [place], [place] [address]  Cancer antigen 27.29     Status: Abnormal   Collection Time: 10/28/17 10:25 AM  Result Value Ref Range   CA 27.29 88.9 (H) 0.0 - 38.6 U/mL    Comment: (NOTE) Siemens Centaur Immunochemiluminometric Methodology (ICMA) Values obtained with different assay methods or kits cannot be used interchangeably. Results cannot be interpreted as absolute evidence of the presence or absence of malignant disease. Performed At: [facility] [place], [place] [id number] Rush Farmer MD YE:[id number]   Comprehensive metabolic panel     Status: Abnormal   Collection Time: 10/28/17 10:25 AM  Result Value Ref Range   Sodium 138 135 - 145 mmol/L   Potassium 3.8 3.5 - 5.1 mmol/L  Chloride 105 98 - 111 mmol/L    Comment: Please note change in reference range.   CO2 23 22 - 32 mmol/L   Glucose, Bld 129 (H) 70 - 99 mg/dL    Comment: Please note change in reference range.   BUN 14 8 - 23 mg/dL    Comment: Please note change in reference range.   Creatinine, Ser 0.97 0.44 - 1.00 mg/dL   Calcium 10.3 8.9 - 10.3 mg/dL   Total Protein 7.4 6.5 - 8.1 g/dL   Albumin 4.3 3.5 - 5.0 g/dL   AST 29 15 - 41 U/L   ALT  26 0 - 44 U/L    Comment: Please note change in reference range.   Alkaline Phosphatase 64 38 - 126 U/L   Total Bilirubin 1.0 0.3 - 1.2 mg/dL   GFR calc non Af Amer 56 (L) >60 mL/min   GFR calc Af Amer >60 >60 mL/min    Comment: (NOTE) The eGFR has been calculated using the CKD EPI equation. This calculation has not been validated in all clinical situations. eGFR's persistently <60 mL/min signify possible Chronic Kidney Disease.    Anion gap 10 5 - 15    Comment: Performed at [facility], [address] [place], [place] [address]  CBC with Differential/Platelet     Status: Abnormal   Collection Time: 10/28/17 10:25 AM  Result Value Ref Range   WBC 3.6 3.6 - 11.0 K/uL   RBC 3.62 (L) 3.80 - 5.20 MIL/uL   Hemoglobin 13.0 12.0 - 16.0 g/dL   HCT 37.3 35.0 - 47.0 %   MCV 103.1 (H) 80.0 - 100.0 fL   MCH 36.0 (H) 26.0 - 34.0 pg   MCHC 34.9 32.0 - 36.0 g/dL   RDW 16.5 (H) 11.5 - 14.5 %   Platelets 310 150 - 440 K/uL   Neutrophils Relative % 61 %   Neutro Abs 2.2 1.4 - 6.5 K/uL   Lymphocytes Relative 31 %   Lymphs Abs 1.1 1.0 - 3.6 K/uL   Monocytes Relative 4 %   Monocytes Absolute 0.2 0.2 - 0.9 K/uL   Eosinophils Relative 1 %   Eosinophils Absolute 0.0 0 - 0.7 K/uL   Basophils Relative 3 %   Basophils Absolute 0.1 0 - 0.1 K/uL    Comment: Performed at [facility], [address] [place], [place] [address]  CBC with Differential/Platelet     Status: Abnormal   Collection Time: 11/18/17  9:51 AM  Result Value Ref Range   WBC 2.5 (L) 3.6 - 11.0 K/uL   RBC 3.46 (L) 3.80 - 5.20 MIL/uL   Hemoglobin 12.7 12.0 - 16.0 g/dL   HCT 36.5 35.0 - 47.0 %   MCV 105.3 (H) 80.0 - 100.0 fL   MCH 36.8 (H) 26.0 - 34.0 pg   MCHC 34.9 32.0 - 36.0 g/dL   RDW 16.4 (H) 11.5 - 14.5 %   Platelets 244 150 - 440 K/uL   Neutrophils Relative % 55 %   Neutro Abs 1.4 1.4 - 6.5 K/uL   Lymphocytes Relative 35 %   Lymphs Abs 0.9 (L) 1.0 - 3.6 K/uL   Monocytes Relative 9 %   Monocytes  Absolute 0.2 0.2 - 0.9 K/uL   Eosinophils Relative 1 %   Eosinophils Absolute 0.0 0 - 0.7 K/uL   Basophils Relative 0 %   Basophils Absolute 0.0 0 - 0.1 K/uL    Comment: Performed at [facility], [address] [place], [place] [address]  Comprehensive metabolic panel     Status: Abnormal   Collection Time: 11/18/17  9:51 AM  Result Value Ref Range   Sodium 139 135 - 145 mmol/L   Potassium 3.7 3.5 - 5.1 mmol/L   Chloride 103 98 - 111 mmol/L   CO2 23 22 - 32 mmol/L   Glucose, Bld 124 (H) 70 - 99 mg/dL   BUN 12 8 - 23 mg/dL   Creatinine, Ser 0.99 0.44 - 1.00 mg/dL   Calcium 10.2 8.9 - 10.3 mg/dL   Total Protein 7.5 6.5 - 8.1 g/dL   Albumin 4.2 3.5 - 5.0 g/dL   AST 29 15 - 41 U/L   ALT 19 0 - 44 U/L   Alkaline Phosphatase 57 38 - 126 U/L   Total Bilirubin 0.8 0.3 - 1.2 mg/dL   GFR calc non Af Amer 55 (L) >60 mL/min   GFR calc Af Amer >60 >60 mL/min    Comment: (NOTE) The eGFR has been calculated using the CKD EPI equation. This calculation has not been validated in all clinical situations. eGFR's persistently <60 mL/min signify possible Chronic Kidney Disease.    Anion gap 13 5 - 15    Comment: Performed at [facility], [address] [place], [place] [address]  Cancer antigen 27.29     Status: Abnormal   Collection Time: 11/18/17  9:51 AM  Result Value Ref Range   CA 27.29 87.9 (H) 0.0 - 38.6 U/mL    Comment: (NOTE) Siemens Centaur Immunochemiluminometric Methodology [facility]) Values obtained with different assay methods or kits cannot be used interchangeably. Results cannot be interpreted as absolute evidence of the presence or absence of malignant disease. Performed At: [facility] [place], [place] [id number] Rush Farmer MD YO:[id number]   Cancer antigen 27.29     Status: Abnormal   Collection Time: 12/15/17 11:11 AM  Result Value Ref Range   CA 27.29 96.2 (H) 0.0 - 38.6 U/mL    Comment: (NOTE) Siemens Centaur  Immunochemiluminometric Methodology [facility]) Values obtained with different assay methods or kits cannot be used interchangeably. Results cannot be interpreted as absolute evidence of the presence or absence of malignant disease. Performed At: [facility] [place], [place] [id number] Rush Farmer MD EH:[id number]   Comprehensive metabolic panel     Status: Abnormal   Collection Time: 12/15/17 11:11 AM  Result Value Ref Range   Sodium 137 135 - 145 mmol/L   Potassium 3.5 3.5 - 5.1 mmol/L   Chloride 101 98 - 111 mmol/L   CO2 26 22 - 32 mmol/L   Glucose, Bld 125 (H) 70 - 99 mg/dL   BUN 11 8 - 23 mg/dL   Creatinine, Ser 0.82 0.44 - 1.00 mg/dL   Calcium 10.3 8.9 - 10.3 mg/dL   Total Protein 7.4 6.5 - 8.1 g/dL   Albumin 4.3 3.5 - 5.0 g/dL   AST 31 15 - 41 U/L   ALT 28 0 - 44 U/L   Alkaline Phosphatase 58 38 - 126 U/L   Total Bilirubin 1.0 0.3 - 1.2 mg/dL   GFR calc non Af Amer >60 >60 mL/min   GFR calc Af Amer >60 >60 mL/min    Comment: (NOTE) The eGFR has been calculated using the CKD EPI equation. This calculation has not been validated in all clinical situations. eGFR's persistently <60 mL/min signify possible Chronic Kidney Disease.    Anion gap 10 5 - 15  Comment: Performed at [facility], [address] [place], [place] [address]  CBC with Differential/Platelet     Status: Abnormal   Collection Time: 12/15/17 11:11 AM  Result Value Ref Range   WBC 2.7 (L) 3.6 - 11.0 K/uL   RBC 3.43 (L) 3.80 - 5.20 MIL/uL   Hemoglobin 12.6 12.0 - 16.0 g/dL   HCT 36.1 35.0 - 47.0 %   MCV 105.0 (H) 80.0 - 100.0 fL   MCH 36.6 (H) 26.0 - 34.0 pg   MCHC 34.8 32.0 - 36.0 g/dL   RDW 16.1 (H) 11.5 - 14.5 %   Platelets 238 150 - 440 K/uL   Neutrophils Relative % 55 %   Neutro Abs 1.5 1.4 - 6.5 K/uL   Lymphocytes Relative 33 %   Lymphs Abs 0.9 (L) 1.0 - 3.6 K/uL   Monocytes Relative 9 %   Monocytes Absolute 0.2 0.2 - 0.9 K/uL   Eosinophils Relative 1 %    Eosinophils Absolute 0.0 0 - 0.7 K/uL   Basophils Relative 2 %   Basophils Absolute 0.1 0 - 0.1 K/uL    Comment: Performed at [facility], [address], [place],  [address]    Radiology Ct Chest W Contrast  Addendum Date: 11/23/2017   ADDENDUM REPORT: 11/23/2017 13:56 ADDENDUM: As mentioned in the body of the report, there is a tiny gas bubble in the bladder lumen. This is likely related to recent instrumentation although bladder infection could have this appearance. Electronically Signed   By: Misty Stanley M.D.   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 EXAM: CT CHEST, ABDOMEN, AND PELVIS WITH CONTRAST TECHNIQUE: Multidetector CT imaging of the chest, abdomen and pelvis was performed following the standard protocol during bolus administration of intravenous contrast. CONTRAST:  6m OMNIPAQUE IOHEXOL 350 MG/ML SOLN COMPARISON:  05/14/2017 FINDINGS: CT CHEST FINDINGS Cardiovascular: Heart size upper normal. No pericardial effusion. Atherosclerotic calcification is noted in the wall of the thoracic aorta. Right Port-A-Cath tip positioned at the SVC/RA junction. Mediastinum/Nodes: No mediastinal lymphadenopathy. There is no hilar lymphadenopathy. The esophagus has normal imaging features. There is no axillary lymphadenopathy. Lungs/Pleura: The central tracheobronchial airways are patent. No suspicious pulmonary nodule or mass. No focal airspace consolidation. No pulmonary edema or pleural effusion. Musculoskeletal: Similar appearance of diffuse bony sclerosis consistent with widespread osseous metastatic involvement. CT ABDOMEN PELVIS FINDINGS Hepatobiliary: No focal abnormality within the liver parenchyma. There is no evidence for gallstones, gallbladder wall thickening, or pericholecystic fluid. No intrahepatic or extrahepatic biliary dilation. Pancreas: No focal mass lesion. No dilatation of the main duct. No  intraparenchymal cyst. No peripancreatic edema. Spleen: No splenomegaly. No focal mass lesion. Adrenals/Urinary Tract: No adrenal nodule or mass. Right kidney unremarkable. 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 Confluence of the 2 left ureters not definitely demonstrated on this study. No evidence for hydroureter. Bladder unremarkable aside from a tiny gas bubble in the non dependent lumen. Stomach/Bowel: Stomach is nondistended. No gastric wall thickening. No evidence of outlet obstruction. Duodenum is normally positioned as is the ligament of Treitz. Large duodenal diverticulum noted near the ligament of Treitz. No small bowel wall thickening. No small bowel dilatation. The terminal ileum is normal. The appendix is not visualized, but there is no edema or inflammation in the region of the cecum. Diverticular changes are noted in the left colon without evidence of  diverticulitis. Vascular/Lymphatic: There is abdominal aortic atherosclerosis without aneurysm. IVC filter is new in the interval. There is no gastrohepatic or hepatoduodenal ligament lymphadenopathy. No intraperitoneal or retroperitoneal lymphadenopathy. No pelvic sidewall lymphadenopathy. Reproductive: Uterus surgically absent.  There is no adnexal mass. Other: No intraperitoneal free fluid. Musculoskeletal: Diffuse bony metastatic disease evident. IMPRESSION: 1. Stable exam. Widespread sclerotic bony metastases without new or progressive findings to suggest new soft tissue metastases. 2.  Aortic Atherosclerois (ICD10-170.0) 3. Interval placement of IVC filter. Electronically Signed: By: Misty Stanley M.D. On: 11/23/2017 13:37   Nm Bone Scan Whole Body  Result Date: 11/24/2017 CLINICAL DATA:  Left breast cancer restaging EXAM: NUCLEAR MEDICINE WHOLE BODY BONE SCAN TECHNIQUE: Whole body anterior and posterior images were obtained approximately 3 hours after intravenous  injection of radiopharmaceutical. RADIOPHARMACEUTICALS:  23.1 mCi Technetium-16mMDP IV COMPARISON:  05/14/2017 FINDINGS: The patient has known chronic diffuse sclerotic osseous metastatic disease based on CT examinations and imaging history. As on 05/14/2017, the bone scan is surprisingly negative throughout much of the visualized skeleton. Questionable accentuated activity in the sternal manubrium and portions of the spine. Degenerative activity primarily in the right knee and ankles. Renal activity is visible bilaterally, and accordingly today's appearance is not classically a "super scan." IMPRESSION: 1. As on the prior examination of 05/14/2017, the bone scan vastly underestimates the widespread sclerotic metastatic disease shown on CT. There is potentially some accentuated activity along the sternal manubrium and perhaps portions of the spine, much of the sclerotic disease shown on CT is not readily apparent on bone scan. 2. Degenerative findings of the right knee and both ankles. Electronically Signed   By: WVan ClinesM.D.   On: 11/24/2017 08:13   Ct Abdomen Pelvis W Contrast  Addendum Date: 11/23/2017   ADDENDUM REPORT: 11/23/2017 13:56 ADDENDUM: As mentioned in the body of the report, there is a tiny gas bubble in the bladder lumen. This is likely related to recent instrumentation although bladder infection could have this appearance. Electronically Signed   By: EMisty StanleyM.D.   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 EXAM: CT CHEST, ABDOMEN, AND PELVIS WITH CONTRAST TECHNIQUE: Multidetector CT imaging of the chest, abdomen and pelvis was performed following the standard protocol during bolus administration of intravenous contrast. CONTRAST:  824mOMNIPAQUE IOHEXOL 350 MG/ML SOLN COMPARISON:  05/14/2017 FINDINGS: CT CHEST FINDINGS Cardiovascular: Heart size upper normal. No pericardial effusion. Atherosclerotic  calcification is noted in the wall of the thoracic aorta. Right Port-A-Cath tip positioned at the SVC/RA junction. Mediastinum/Nodes: No mediastinal lymphadenopathy. There is no hilar lymphadenopathy. The esophagus has normal imaging features. There is no axillary lymphadenopathy. Lungs/Pleura: The central tracheobronchial airways are patent. No suspicious pulmonary nodule or mass. No focal airspace consolidation. No pulmonary edema or pleural effusion. Musculoskeletal: Similar appearance of diffuse bony sclerosis consistent with widespread osseous metastatic involvement. CT ABDOMEN PELVIS FINDINGS Hepatobiliary: No focal abnormality within the liver parenchyma. There is no evidence for gallstones, gallbladder wall thickening, or pericholecystic fluid. No intrahepatic or extrahepatic biliary dilation. Pancreas: No focal mass lesion. No dilatation of the main duct. No intraparenchymal cyst. No peripancreatic edema. Spleen: No splenomegaly. No focal mass lesion. Adrenals/Urinary Tract: No adrenal nodule or mass. Right kidney unremarkable. 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 Confluence of the 2 left ureters not definitely demonstrated on this study. No evidence for hydroureter. Bladder unremarkable  aside from a tiny gas bubble in the non dependent lumen. Stomach/Bowel: Stomach is nondistended. No gastric wall thickening. No evidence of outlet obstruction. Duodenum is normally positioned as is the ligament of Treitz. Large duodenal diverticulum noted near the ligament of Treitz. No small bowel wall thickening. No small bowel dilatation. The terminal ileum is normal. The appendix is not visualized, but there is no edema or inflammation in the region of the cecum. Diverticular changes are noted in the left colon without evidence of diverticulitis. Vascular/Lymphatic: There is abdominal aortic atherosclerosis without aneurysm. IVC filter is  new in the interval. There is no gastrohepatic or hepatoduodenal ligament lymphadenopathy. No intraperitoneal or retroperitoneal lymphadenopathy. No pelvic sidewall lymphadenopathy. Reproductive: Uterus surgically absent.  There is no adnexal mass. Other: No intraperitoneal free fluid. Musculoskeletal: Diffuse bony metastatic disease evident. IMPRESSION: 1. Stable exam. Widespread sclerotic bony metastases without new or progressive findings to suggest new soft tissue metastases. 2.  Aortic Atherosclerois (ICD10-170.0) 3. Interval placement of IVC filter. Electronically Signed: By: Misty Stanley M.D. On: 11/23/2017 13:37    Assessment/Plan Breast cancer in female [place] [facility]) This increases her clotting risk and is likely a cause of her being hypercoagulable.  Hypertension blood pressure control important in reducing the progression of atherosclerotic disease. On appropriate oral medications.   Port catheter in place Placed last year by her Education officer, environmental and working well.  DVT (deep venous thrombosis) ([place]) At this point, she has tolerated anticoagulation well and had a successful venous intervention for extensive DVT about 4 months ago.  Her filter will be removed next month.  Risks and benefits of filter removal were discussed.  PAD (peripheral artery disease) (HCC) ABIs were 0.90 on the right 0.82 on the left.  The digital waveforms are reduced, but she has normal triphasic waveforms at the ankle.  She is doing better after revascularization a month ago.  Continue current medical regimen.  Plan to recheck arterial perfusion in 6 months with ABIs    Leotis Pain, MD  12/21/2017 12:09 PM    This note was created with Dragon medical transcription system.  Any errors from dictation are purely unintentional

## 2017-12-21 NOTE — Assessment & Plan Note (Signed)
At this point, she has tolerated anticoagulation well and had a successful venous intervention for extensive DVT about 4 months ago.  Her filter will be removed next month.  Risks and benefits of filter removal were discussed.

## 2017-12-28 ENCOUNTER — Telehealth: Payer: Self-pay | Admitting: *Deleted

## 2017-12-28 DIAGNOSIS — Z170 Estrogen receptor positive status [ER+]: Principal | ICD-10-CM

## 2017-12-28 DIAGNOSIS — C50912 Malignant neoplasm of unspecified site of left female breast: Secondary | ICD-10-CM

## 2017-12-28 MED ORDER — LETROZOLE 2.5 MG PO TABS: 2.5000 mg | ORAL_TABLET | Freq: Every day | ORAL | 3 refills | Status: DC

## 2017-12-28 NOTE — Telephone Encounter (Signed)
Pt needs refill of letrozole. Sent to her pharmacy

## 2018-01-04 ENCOUNTER — Other Ambulatory Visit: Payer: Self-pay | Admitting: Oncology

## 2018-01-04 DIAGNOSIS — C50912 Malignant neoplasm of unspecified site of left female breast: Secondary | ICD-10-CM

## 2018-01-04 DIAGNOSIS — Z170 Estrogen receptor positive status [ER+]: Principal | ICD-10-CM

## 2018-01-06 ENCOUNTER — Telehealth: Payer: Self-pay | Admitting: Gastroenterology

## 2018-01-06 NOTE — Telephone Encounter (Signed)
Pt is calling for Victoria Steele she states Victoria Steele had some results for her

## 2018-01-10 NOTE — Telephone Encounter (Signed)
Returned patients call.  She had a referral for a colonoscopy.  She does not wish to schedule colonoscopy until she checks with her insurance.  Thanks [employer]

## 2018-01-11 ENCOUNTER — Telehealth: Payer: Self-pay | Admitting: Pharmacy Technician

## 2018-01-11 MED FILL — IBRANCE 75 MG CAPSULE: 75 | 28 days supply | Qty: 21 | Fill #0

## 2018-01-11 NOTE — Telephone Encounter (Signed)
Oral Oncology Patient Advocate Encounter   Was successful in securing patient an $60 grant from [facility]) to provide copayment coverage for Ibrance.  This will keep the out of pocket expense at $0.     I have spoken with the patient.    The billing information is as follows and has been shared with [place].   Member ID: [id number] Group ID: [id number] RxBin: [id number] Dates of Eligibility: 10/13/17 through 01/11/2019  [place] Patient [place] Phone [phone] Fax [phone] 01/11/2018 3:27 PM

## 2018-01-13 ENCOUNTER — Inpatient Hospital Stay: Payer: Medicare Other

## 2018-01-13 ENCOUNTER — Inpatient Hospital Stay: Payer: Medicare Other | Attending: Oncology | Admitting: Oncology

## 2018-01-13 ENCOUNTER — Encounter: Payer: Self-pay | Admitting: Oncology

## 2018-01-13 ENCOUNTER — Other Ambulatory Visit: Payer: Self-pay

## 2018-01-13 VITALS — BP 147/77 | HR 75 | Temp 99.0°F | Resp 18 | Ht 62.0 in | Wt 200.4 lb

## 2018-01-13 DIAGNOSIS — Z170 Estrogen receptor positive status [ER+]: Principal | ICD-10-CM

## 2018-01-13 DIAGNOSIS — Z79811 Long term (current) use of aromatase inhibitors: Secondary | ICD-10-CM

## 2018-01-13 DIAGNOSIS — C7951 Secondary malignant neoplasm of bone: Secondary | ICD-10-CM | POA: Diagnosis not present

## 2018-01-13 DIAGNOSIS — Z7983 Long term (current) use of bisphosphonates: Secondary | ICD-10-CM | POA: Insufficient documentation

## 2018-01-13 DIAGNOSIS — C50912 Malignant neoplasm of unspecified site of left female breast: Secondary | ICD-10-CM | POA: Insufficient documentation

## 2018-01-13 DIAGNOSIS — Z1211 Encounter for screening for malignant neoplasm of colon: Secondary | ICD-10-CM

## 2018-01-13 DIAGNOSIS — Z79899 Other long term (current) drug therapy: Secondary | ICD-10-CM

## 2018-01-13 LAB — CBC WITH DIFFERENTIAL/PLATELET
BASOS ABS: 0.1 10*3/uL (ref 0–0.1)
BASOS PCT: 2 %
EOS ABS: 0 10*3/uL (ref 0–0.7)
Eosinophils Relative: 1 %
HCT: 33.2 % — ABNORMAL LOW (ref 35.0–47.0)
Hemoglobin: 11.7 g/dL — ABNORMAL LOW (ref 12.0–16.0)
Lymphocytes Relative: 34 %
Lymphs Abs: 0.8 10*3/uL — ABNORMAL LOW (ref 1.0–3.6)
MCH: 37.2 pg — ABNORMAL HIGH (ref 26.0–34.0)
MCHC: 35.2 g/dL (ref 32.0–36.0)
MCV: 105.6 fL — ABNORMAL HIGH (ref 80.0–100.0)
MONO ABS: 0.3 10*3/uL (ref 0.2–0.9)
MONOS PCT: 11 %
NEUTROS ABS: 1.3 10*3/uL — AB (ref 1.4–6.5)
Neutrophils Relative %: 52 %
Platelets: 218 10*3/uL (ref 150–440)
RBC: 3.14 MIL/uL — ABNORMAL LOW (ref 3.80–5.20)
RDW: 16.3 % — AB (ref 11.5–14.5)
WBC: 2.5 10*3/uL — ABNORMAL LOW (ref 3.6–11.0)

## 2018-01-13 LAB — COMPREHENSIVE METABOLIC PANEL
ALT: 28 U/L (ref 0–44)
ANION GAP: 10 (ref 5–15)
AST: 34 U/L (ref 15–41)
Albumin: 4.1 g/dL (ref 3.5–5.0)
Alkaline Phosphatase: 55 U/L (ref 38–126)
BUN: 10 mg/dL (ref 8–23)
CHLORIDE: 105 mmol/L (ref 98–111)
CO2: 24 mmol/L (ref 22–32)
CREATININE: 0.84 mg/dL (ref 0.44–1.00)
Calcium: 9.9 mg/dL (ref 8.9–10.3)
Glucose, Bld: 131 mg/dL — ABNORMAL HIGH (ref 70–99)
Potassium: 3.5 mmol/L (ref 3.5–5.1)
SODIUM: 139 mmol/L (ref 135–145)
Total Bilirubin: 1.2 mg/dL (ref 0.3–1.2)
Total Protein: 7 g/dL (ref 6.5–8.1)

## 2018-01-13 MED ORDER — HEPARIN SOD (PORK) LOCK FLUSH 100 UNIT/ML IV SOLN
500.0000 [IU] | Freq: Once | INTRAVENOUS | Status: AC
  Administered 2018-01-13: 500 [IU] via INTRAVENOUS
  Filled 2018-01-13: qty 5

## 2018-01-13 MED ORDER — SODIUM CHLORIDE 0.9% FLUSH
10.0000 mL | Freq: Once | INTRAVENOUS | Status: AC
  Administered 2018-01-13: 10 mL via INTRAVENOUS
  Filled 2018-01-13: qty 10

## 2018-01-13 MED ORDER — ZOLEDRONIC ACID 4 MG/100ML IV SOLN
4.0000 mg | Freq: Once | INTRAVENOUS | Status: AC
  Administered 2018-01-13: 4 mg via INTRAVENOUS
  Filled 2018-01-13: qty 100

## 2018-01-13 MED ORDER — SODIUM CHLORIDE 0.9 % IV SOLN
INTRAVENOUS | Status: DC
  Administered 2018-01-13: 15:00:00 via INTRAVENOUS
  Filled 2018-01-13: qty 250

## 2018-01-13 MED ORDER — HEPARIN SOD (PORK) LOCK FLUSH 100 UNIT/ML IV SOLN
INTRAVENOUS | Status: AC
  Filled 2018-01-13: qty 5

## 2018-01-13 MED ORDER — LIDOCAINE-PRILOCAINE 2.5-2.5 % EX CREA: 1.0000 "application " | TOPICAL_CREAM | CUTANEOUS | 2 refills | Status: DC | PRN

## 2018-01-13 NOTE — Progress Notes (Signed)
No concerns today, will start next cycle of ibrance tom

## 2018-01-14 ENCOUNTER — Telehealth: Payer: Self-pay | Admitting: Gastroenterology

## 2018-01-14 LAB — CANCER ANTIGEN 27.29: CA 27.29: 85.8 U/mL — ABNORMAL HIGH (ref 0.0–38.6)

## 2018-01-14 NOTE — Progress Notes (Signed)
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 Patient is a 77 year old female with no significant comorbidities who noticed left breast mass about 6-9 months ago. She thought it would go awaybut it continued to increase in size and began to involve her skin. She also noted tenderness to palpation as well as intermittent sharp tingling pain. She was seen by Dr. Adora Fridge on 08/25/2016 and patient had a biopsy of her skin in his office which showed:DIAGNOSIS:  A. LEFT BREAST SKIN; EXCISION:  - INVASIVE CARCINOMA MORPHOLOGICALLY CONSISTENT WITH MAMMARY ORIGIN  INVOLVING THE DERMIS.   BREAST BIOMARKER TESTS  Estrogen Receptor (ER) Status: POSITIVE, >90%  Progesterone Receptor (PgR) Status: POSITIVE, >70%  Her2 negative  2. Patient underwent bilateral diagnostic mammogram on 08/31/2016 showed:IMPRESSION: 1. Known left breast cancer, retroareolar with probable extension to the nipple, measuring 4.3 cm greatest dimension by ultrasound, with associated architectural distortion and associated left nipple retraction, and with associated diffuse skin thickening on the left. 2. No enlarged or morphologically abnormal lymph nodes are seen in the left axilla by ultrasound. 3. No evidence of malignancy within the right breast.  3. Patient was also complaining of some perirectal pain and underwent CT abdomen pelvis with contrast on 09/02/2016 which showed: IMPRESSION: Left colonic diverticulosis. No active diverticulitis.Fatty liver. No acute findings or  evidence of metastatic disease in the abdomen or pelvis. Diffuse bony sclerotic metastases.  4. Other than that her breast pain patient overall feels well. Denies any unintentional weight loss. She continues to be active and care for her great-grandchildren. She has had a hysterectomy in the past. No family history of breast or ovarian cancer.  5. Given that she had inflammatory breast cancer- plan was to give 4 cycles of dose dense AC followed by possible mastectomy and treat bone mets with AI + ibrance  6. PET CT scan on 09/11/16 showed: IMPRESSION: 1. Low-grade but abnormal hypermetabolic activity associated with the cutaneous and sub areolar glandular tissues of the left breast, compatible with malignancy, representative SUV 3.9 compared to contralateral normal side 1.4. 2. Diffuse sclerotic osseous metastatic disease with low-grade hypermetabolic activity, a representative SUV along the left sacrum 5.4. 3. Other imaging findings of potential clinical significance: Aortic Atherosclerosis (ICD10-I70.0). Descending and sigmoid colon Diverticulosis.  7. Baseline MUGA scan showed EF of 71%. Baseline CA 27.29 elevated at 563.2  8. 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  9. Letrozole and ibrance started in sept 2018. zometa monthly also started. Baseline bone density scan normal  10. ibrance held in oct 2018 after cycle 1 due to Grade 3 neutropenia and thrombocytopenia.cycle 2 restaretd on 02/04/17 at 100 mg. Patient continued to have prolongeed grade 3 neutropenia and dose lowered to 75 mg  11. Patient had extensive RLE DVT in April 2019 and is currently on eliquis. She also underwent venous thrombectomy and thrombolytic therapy and IVC placement by Dr. Lucky Cowboy.  She also has peripheral vascular disease secondary to atherosclerosis and underwent stenting with vascular surgery as well   Interval history-reports fatigue when she is on Ibrance but feels better during  her week off.  Denies any diarrhea or skin rash.  Her appetite is stable and she has not had any intentional weight loss.  ECOG PS- 1 Pain scale- 0 Opioid associated constipation- no  Review of systems- Review of Systems  Constitutional: Positive for malaise/fatigue. Negative for chills, fever and weight loss.  HENT: Negative for congestion, ear discharge and nosebleeds.   Eyes: Negative for blurred vision.  Respiratory: Negative for cough, hemoptysis, sputum production, shortness of breath and wheezing.   Cardiovascular: Negative for chest pain, palpitations, orthopnea and claudication.  Gastrointestinal: Negative for abdominal pain, blood in stool, constipation, diarrhea, heartburn, melena, nausea and vomiting.  Genitourinary: Negative for dysuria, flank pain, frequency, hematuria and urgency.  Musculoskeletal: Negative for back pain, joint pain and myalgias.  Skin: Negative for rash.  Neurological: Negative for dizziness, tingling, focal weakness, seizures, weakness and headaches.  Endo/Heme/Allergies: Does not bruise/bleed easily.  Psychiatric/Behavioral: Negative for depression and suicidal ideas. The patient does not have insomnia.        No Known Allergies   Past Medical History:  Diagnosis Date  . Breast cancer ([place])   . Cancer ([place])    left breast  . DVT (deep venous thrombosis) ([place])    2019  . Hypertension   . Port catheter in place 09/14/2016   Placed 09/10/2016 RT chest.     Past Surgical History:  Procedure Laterality Date  . ABDOMINAL HYSTERECTOMY    . APPENDECTOMY    . BREAST BIOPSY    . CYST EXCISION Right 09/2016   Buttocks  . LOWER EXTREMITY ANGIOGRAPHY Right 11/11/2017   Procedure: LOWER EXTREMITY ANGIOGRAPHY;  Surgeon: Katha Cabal, MD;  Location: [place] [facility];  Service: Cardiovascular;  Laterality: Right;  . PERIPHERAL VASCULAR THROMBECTOMY Right 08/30/2017   Procedure: PERIPHERAL VASCULAR THROMBECTOMY;  Surgeon: Algernon Huxley, MD;   Location: [place] [facility];  Service: Cardiovascular;  Laterality: Right;  . PORTACATH PLACEMENT Right 09/10/2016   Procedure: INSERTION PORT-A-CATH;  Surgeon: Jules Husbands, MD;  Location: [facility] OR[facility];  Service: General;  Laterality: Right;    Social History   Socioeconomic History  . Marital status: Married    Spouse name: Jeneen Rinks  . Number of children: 2  . Years of education: 6  . Highest education level: Bachelor's degree (e.g., BA, AB, BS)  Occupational History  . 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[employer]  Social Needs  . Financial resource strain: 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  . Food insecurity:    Worry: Never true    Inability: Never true  . Transportation needs:    Medical: No    Non-medical: No  Tobacco Use  . Smoking status: Never Smoker  . Smokeless tobacco: Never Used  Substance and Sexual Activity  . Alcohol use: No  . Drug use: No  . Sexual activity: Never  Lifestyle  . Physical activity:    Days per week: Patient refused    Minutes per session: Patient refused  . Stress: Not at all  Relationships  . Social connections:    Talks on phone: More than three times a week    Gets together: Once a week    Attends religious service: More than 4 times per year    Active member of club or organization: No    Attends meetings of clubs or organizations: Never    Relationship status: Not on file  . Intimate partner violence:    Fear of current or ex partner:  Not on file    Emotionally abused: Not on file    Physically abused: Not on file    Forced sexual activity: Not on file  Other Topics Concern  . Not on file  Social History Narrative  . Not on file    Family History  Problem Relation Age of Onset  . Parkinson's disease Mother   . Bladder Cancer Father   . Lung cancer Father      Current Outpatient Medications:  .  acetaminophen (TYLENOL) 500 MG tablet, Take 500 mg by mouth daily as needed for moderate pain or headache. , Disp: , Rfl:    .  amLODipine (NORVASC) 10 MG tablet, Take 10 mg by mouth daily., Disp: , Rfl:  .  apixaban (ELIQUIS) 5 MG TABS tablet, Take 1 tablet (5 mg total) by mouth 2 (two) times daily., Disp: 60 tablet, Rfl: 2 .  aspirin EC 81 MG tablet, Take 1 tablet (81 mg total) by mouth daily., Disp: 150 tablet, Rfl: 2 .  Calcium Carb-Cholecalciferol (CALCIUM 600/VITAMIN D3 PO), Take 1 tablet by mouth daily., Disp: , Rfl:  .  hydrochlorothiazide (MICROZIDE) 12.5 MG capsule, Take 12.5 mg by mouth daily. , Disp: , Rfl:  .  IBRANCE 75 MG capsule, TAKE 1 CAPSULE (75 MG TOTAL) BY MOUTH DAILY WITH BREAKFAST. TAKE FOR 21 DAYS ON, THEN 7 DAYS OFF., Disp: 21 capsule, Rfl: 3 .  letrozole (FEMARA) 2.5 MG tablet, Take 1 tablet (2.5 mg total) by mouth daily., Disp: 90 tablet, Rfl: 3 .  lidocaine-prilocaine (EMLA) cream, Apply 1 application topically as needed (port access)., Disp: 30 g, Rfl: 2 .  PREVIDENT 5000 BOOSTER PLUS 1.1 % PSTE, Place 1 application onto teeth 2 (two) times daily. , Disp: , Rfl:  .  Vitamin D, Cholecalciferol, 400 units CAPS, Take 400 Units by mouth daily. , Disp: , Rfl:  .  oxyCODONE-acetaminophen (PERCOCET/ROXICET) 5-325 MG tablet, Take 1 tablet by mouth every 6 (six) hours as needed for moderate pain or severe pain. (Patient not taking: Reported on 11/25/2017), Disp: 28 tablet, Rfl: 0 .  prochlorperazine (COMPAZINE) 10 MG tablet, Take 1 tablet (10 mg total) by mouth every 6 (six) hours as needed (Nausea or vomiting). (Patient not taking: Reported on 11/25/2017), Disp: 30 tablet, Rfl: 1  Physical exam:  Vitals:   01/13/18 1341  BP: (!) 147/77  Pulse: 75  Resp: 18  Temp: 99 F (37.2 C)  TempSrc: Tympanic  Weight: 200 lb 6.4 oz (90.9 kg)  Height: '5\' 2"'$  (1.575 m)   Physical Exam  Constitutional: She is oriented to person, place, and time. She appears well-developed and well-nourished.  HENT:  Head: Normocephalic and atraumatic.  Eyes: Pupils are equal, round, and reactive to light. EOM are normal.   Neck: Normal range of motion.  Cardiovascular: Normal rate, regular rhythm and normal heart sounds.  Pulmonary/Chest: Effort normal and breath sounds normal.  Abdominal: Soft. Bowel sounds are normal.  Neurological: She is alert and oriented to person, place, and time.  Skin: Skin is warm and dry.     CMP Latest Ref Rng & Units 01/13/2018  Glucose 70 - 99 mg/dL 131(H)  BUN 8 - 23 mg/dL 10  Creatinine 0.44 - 1.00 mg/dL 0.84  Sodium 135 - 145 mmol/L 139  Potassium 3.5 - 5.1 mmol/L 3.5  Chloride 98 - 111 mmol/L 105  CO2 22 - 32 mmol/L 24  Calcium 8.9 - 10.3 mg/dL 9.9  Total Protein 6.5 - 8.1 g/dL 7.0  Total  Bilirubin 0.3 - 1.2 mg/dL 1.2  Alkaline Phos 38 - 126 U/L 55  AST 15 - 41 U/L 34  ALT 0 - 44 U/L 28   CBC Latest Ref Rng & Units 01/13/2018  WBC 3.6 - 11.0 K/uL 2.5(L)  Hemoglobin 12.0 - 16.0 g/dL 11.7(L)  Hematocrit 35.0 - 47.0 % 33.2(L)  Platelets 150 - 440 K/uL 218      Assessment and plan- Patient is a 77 y.o. female Stage IV ER PR positive her 2 negative breast cancer with bone only mets.  She is here for routine follow-up of her breast cancer on Ibrance and letrozole  Patient is white count is 2.5 with an ANC of 1.3.  She will continue Ibrance at current dose of 75 mg since her [place] is greater than 1.  H&H and platelet counts are stable at this dosing.  She will also continue letrozole along with calcium and vitamin D.  Her CA-27-29 has been stable between 80s to 90s at this dosing.  It has not trended down significantly but has not trended up either.  She is due for repeat scans in December 2019  She will get Zometa today and Zometa next month.  I will see her back in 2 months time with CBC CMP and CA-27-29 and scans prior   Visit Diagnosis 1. High risk medication use   2. Bone metastases ([place])   3. Malignant neoplasm of left breast in female, estrogen receptor positive, unspecified site of breast ([place])   4. Long term (current) use of bisphosphonates      Dr.  Randa Evens, MD, MPH [facility] at [facility] [phone] 01/14/2018 11:04 AM

## 2018-01-14 NOTE — Telephone Encounter (Signed)
Patient called and needs to cancel her colonoscopy scheduled 02-08-18 with Dr Vicente Males . Her Oncologist does not want her to have this at this time.

## 2018-01-17 ENCOUNTER — Ambulatory Visit
Admission: RE | Admit: 2018-01-17 | Discharge: 2018-01-17 | Disposition: A | Discharge status: Home / Self Care | Payer: Medicare Other | Source: Ambulatory Visit | Attending: Vascular Surgery | Admitting: Vascular Surgery

## 2018-01-17 ENCOUNTER — Encounter
Admission: RE | Disposition: A | Discharge status: Home / Self Care | Payer: Self-pay | Source: Ambulatory Visit | Attending: Vascular Surgery

## 2018-01-17 DIAGNOSIS — I824Z1 Acute embolism and thrombosis of unspecified deep veins of right distal lower extremity: Secondary | ICD-10-CM | POA: Insufficient documentation

## 2018-01-17 DIAGNOSIS — Z801 Family history of malignant neoplasm of trachea, bronchus and lung: Secondary | ICD-10-CM | POA: Diagnosis not present

## 2018-01-17 DIAGNOSIS — Z8052 Family history of malignant neoplasm of bladder: Secondary | ICD-10-CM | POA: Insufficient documentation

## 2018-01-17 DIAGNOSIS — Z452 Encounter for adjustment and management of vascular access device: Secondary | ICD-10-CM | POA: Diagnosis not present

## 2018-01-17 DIAGNOSIS — Z90710 Acquired absence of both cervix and uterus: Secondary | ICD-10-CM | POA: Diagnosis not present

## 2018-01-17 DIAGNOSIS — Z955 Presence of coronary angioplasty implant and graft: Secondary | ICD-10-CM | POA: Insufficient documentation

## 2018-01-17 DIAGNOSIS — Z9889 Other specified postprocedural states: Secondary | ICD-10-CM | POA: Diagnosis not present

## 2018-01-17 DIAGNOSIS — C50912 Malignant neoplasm of unspecified site of left female breast: Secondary | ICD-10-CM | POA: Diagnosis not present

## 2018-01-17 DIAGNOSIS — Z7901 Long term (current) use of anticoagulants: Secondary | ICD-10-CM | POA: Diagnosis not present

## 2018-01-17 DIAGNOSIS — I10 Essential (primary) hypertension: Secondary | ICD-10-CM | POA: Insufficient documentation

## 2018-01-17 DIAGNOSIS — I82409 Acute embolism and thrombosis of unspecified deep veins of unspecified lower extremity: Secondary | ICD-10-CM

## 2018-01-17 DIAGNOSIS — I739 Peripheral vascular disease, unspecified: Secondary | ICD-10-CM | POA: Insufficient documentation

## 2018-01-17 DIAGNOSIS — Z95828 Presence of other vascular implants and grafts: Secondary | ICD-10-CM | POA: Insufficient documentation

## 2018-01-17 DIAGNOSIS — Z79899 Other long term (current) drug therapy: Secondary | ICD-10-CM | POA: Insufficient documentation

## 2018-01-17 HISTORY — PX: IVC FILTER REMOVAL: CATH118246

## 2018-01-17 SURGERY — IVC FILTER REMOVAL
Anesthesia: Moderate Sedation

## 2018-01-17 MED ORDER — HYDROMORPHONE HCL 1 MG/ML IJ SOLN: 1.0000 mg | Freq: Once | INTRAMUSCULAR | Status: DC | PRN

## 2018-01-17 MED ORDER — SODIUM CHLORIDE 0.9 % IV SOLN: INTRAVENOUS | Status: DC

## 2018-01-17 MED ORDER — HEPARIN SOD (PORK) LOCK FLUSH 100 UNIT/ML IV SOLN
INTRAVENOUS | Status: AC
  Filled 2018-01-17: qty 5

## 2018-01-17 MED ORDER — FENTANYL CITRATE (PF) 100 MCG/2ML IJ SOLN
INTRAMUSCULAR | Status: AC
  Filled 2018-01-17: qty 2

## 2018-01-17 MED ORDER — IOPAMIDOL (ISOVUE-300) INJECTION 61%
INTRAVENOUS | Status: DC | PRN
  Administered 2018-01-17: 15 mL via INTRA_ARTERIAL

## 2018-01-17 MED ORDER — MIDAZOLAM HCL 5 MG/5ML IJ SOLN
INTRAMUSCULAR | Status: AC
  Filled 2018-01-17: qty 5

## 2018-01-17 MED ORDER — FENTANYL CITRATE (PF) 100 MCG/2ML IJ SOLN
INTRAMUSCULAR | Status: DC | PRN
  Administered 2018-01-17: 50 ug via INTRAVENOUS

## 2018-01-17 MED ORDER — ONDANSETRON HCL 4 MG/2ML IJ SOLN: 4.0000 mg | Freq: Four times a day (QID) | INTRAMUSCULAR | Status: DC | PRN

## 2018-01-17 MED ORDER — MIDAZOLAM HCL 2 MG/2ML IJ SOLN
INTRAMUSCULAR | Status: DC | PRN
  Administered 2018-01-17: 2 mg via INTRAVENOUS

## 2018-01-17 MED ORDER — LIDOCAINE-EPINEPHRINE (PF) 1 %-1:200000 IJ SOLN
INTRAMUSCULAR | Status: AC
  Filled 2018-01-17: qty 30

## 2018-01-17 MED ORDER — HEPARIN (PORCINE) IN NACL 1000-0.9 UT/500ML-% IV SOLN
INTRAVENOUS | Status: AC
  Filled 2018-01-17: qty 500

## 2018-01-17 SURGICAL SUPPLY — 3 items
PACK ANGIOGRAPHY (CUSTOM PROCEDURE TRAY) ×3 IMPLANT
SET VENACAVA FILTER RETRIEVAL (MISCELLANEOUS) ×3 IMPLANT
WIRE J 3MM .035X145CM (WIRE) ×3 IMPLANT

## 2018-01-17 NOTE — Progress Notes (Signed)
Dr. Lucky Cowboy in at bedside to speak with pt. And spouse re: IVC Filter removal. Both verbalize understanding. No complications to LIJ site. Return appt. Made for pt.

## 2018-01-17 NOTE — Op Note (Signed)
[facility]   OPERATIVE NOTE    PRE-OPERATIVE DIAGNOSIS:  1. DVT 2. status post IVC filter placement  POST-OPERATIVE DIAGNOSIS: Same as above  PROCEDURE: 1. Ultrasound guidance for vascular access right jugular vein 2. Catheter placement into inferior vena cava from right jugular vein 3. Inferior venacavogram 4. Retrieval of [place] Celect IVC filter  SURGEON: Leotis Pain, MD  ASSISTANT(S): None  ANESTHESIA: Local with moderate conscious sedation for approximately 15 minutes using 2 mg of Versed and 50 mcg of Fentanyl  ESTIMATED BLOOD LOSS: 5 cc  CONTRAST:  15 cc  FLUORO TIME:  1.2 minutes  FINDING(S): 1. small amount of thrombus within the filter itself with the IVC being otherwise widely patent  SPECIMEN(S): IVC filter  INDICATIONS:  Patient is a 77 y.o. female who presents with a previous history of IVC filter placement. Patient has tolerated anticoagulation and no longer needs this filter. The patient remains on anticoagulation. Risks and benefits were discussed, and informed consent was obtained.  DESCRIPTION: After obtaining full informed written consent, the patient was brought back to the vascular suite and placed supine upon the table.Moderate conscious sedation was administered during a face to face encounter with the patient throughout the procedure with my supervision of the RN administering medicines and monitoring the patient's vital signs, pulse oximetry, telemetry and mental status throughout from the start of the procedure until the patient was taken to the recovery room.  After obtaining adequate anesthesia, the patient was prepped and draped in the standard fashion. The right jugular vein was visualized with ultrasound and found to be widely patent. It was then accessed under direct ultrasound guidance without difficulty with the Seldinger needle and a permanent image was recorded. A J-wire was placed. After skin nick and  dilatation, the retrieval sheath was placed over the wire and advanced into the inferior vena cava. Inferior vena cava was imaged and found to be widely patent on inferior venacavogram except for a small amount of thrombus in the top of the filter. The filter was straight in its orientation. The retrieval snare was then placed through the sheath and the hook of the filter was snared without difficulty. The sheath was then advanced, and the filter was collapsed and brought into the sheath in its entirety. It was then removed from the body in its entirety. The retrieval sheath was then removed. Pressure was held at the access site and sterile dressing was placed. The patient was taken to the recovery room in stable condition having tolerated the procedure well.  COMPLICATIONS: None  CONDITION: Stable   Leotis Pain 01/17/2018 8:34 AM  This note was created with Dragon Medical transcription system. Any errors in dictation are purely unintentional.

## 2018-01-17 NOTE — H&P (Signed)
 [facility] History & Physical Update  The patient was interviewed and re-examined.  The patient's previous History and Physical has been reviewed and is unchanged.  There is no change in the plan of care. We plan to proceed with the scheduled procedure.  Leotis Pain, MD  01/17/2018, 8:05 AM

## 2018-01-28 ENCOUNTER — Telehealth: Payer: Self-pay | Admitting: Pharmacist

## 2018-01-28 NOTE — Telephone Encounter (Signed)
Oral Chemotherapy Pharmacist Encounter   Attempted to reach patient for follow up on oral medication: Ibrance (palbociclib). No answer. Left VM for patient to call back.    Darl Pikes, PharmD, BCPS, [facility] Hematology/Oncology Clinical Pharmacist [facility] [phone]  01/28/2018 3:30 PM

## 2018-02-01 NOTE — Telephone Encounter (Signed)
Follow-Up Form  Called patient today to follow up regarding patient's oral chemotherapy medication: Ibrance (palbociclib)  Original Start date of oral chemotherapy: 12/2016  Pt reports 0 tablets/doses of Ibrance missed so far this cycle.  Pt reports the following side effects: None reported  Recent labs reviewed: CA 27.29 from 01/13/18  New medications?: None reported  Other Issues: None reported  Patient knows to call the office with questions or concerns. Oral Oncology Clinic will continue to follow.  Darl Pikes, PharmD, BCPS, [facility] Hematology/Oncology Clinical Pharmacist [facility] [phone]  02/01/2018 4:24 PM

## 2018-02-08 ENCOUNTER — Ambulatory Visit: Admit: 2018-02-08 | Payer: Medicare Other | Admitting: Gastroenterology

## 2018-02-08 SURGERY — COLONOSCOPY WITH PROPOFOL
Anesthesia: General

## 2018-02-08 MED FILL — IBRANCE 75 MG CAPSULE: 75 | 28 days supply | Qty: 21 | Fill #1

## 2018-02-14 ENCOUNTER — Ambulatory Visit (INDEPENDENT_AMBULATORY_CARE_PROVIDER_SITE_OTHER): Payer: Medicare Other | Admitting: Nurse Practitioner

## 2018-02-15 ENCOUNTER — Inpatient Hospital Stay: Payer: Medicare Other

## 2018-02-15 ENCOUNTER — Inpatient Hospital Stay: Payer: Medicare Other | Attending: Oncology

## 2018-02-15 VITALS — BP 148/75 | HR 72 | Temp 99.1°F | Resp 18

## 2018-02-15 DIAGNOSIS — Z79811 Long term (current) use of aromatase inhibitors: Secondary | ICD-10-CM | POA: Diagnosis not present

## 2018-02-15 DIAGNOSIS — C50912 Malignant neoplasm of unspecified site of left female breast: Secondary | ICD-10-CM | POA: Insufficient documentation

## 2018-02-15 DIAGNOSIS — Z170 Estrogen receptor positive status [ER+]: Secondary | ICD-10-CM | POA: Diagnosis not present

## 2018-02-15 DIAGNOSIS — Z79899 Other long term (current) drug therapy: Secondary | ICD-10-CM | POA: Insufficient documentation

## 2018-02-15 DIAGNOSIS — C7951 Secondary malignant neoplasm of bone: Secondary | ICD-10-CM | POA: Diagnosis not present

## 2018-02-15 LAB — COMPREHENSIVE METABOLIC PANEL
ALK PHOS: 53 U/L (ref 38–126)
ALT: 33 U/L (ref 0–44)
AST: 33 U/L (ref 15–41)
Albumin: 4.1 g/dL (ref 3.5–5.0)
Anion gap: 8 (ref 5–15)
BILIRUBIN TOTAL: 0.8 mg/dL (ref 0.3–1.2)
BUN: 13 mg/dL (ref 8–23)
CALCIUM: 9.6 mg/dL (ref 8.9–10.3)
CO2: 24 mmol/L (ref 22–32)
CREATININE: 0.8 mg/dL (ref 0.44–1.00)
Chloride: 107 mmol/L (ref 98–111)
GLUCOSE: 129 mg/dL — AB (ref 70–99)
Potassium: 3.8 mmol/L (ref 3.5–5.1)
Sodium: 139 mmol/L (ref 135–145)
TOTAL PROTEIN: 6.9 g/dL (ref 6.5–8.1)

## 2018-02-15 LAB — CBC WITH DIFFERENTIAL/PLATELET
Abs Immature Granulocytes: 0.02 10*3/uL (ref 0.00–0.07)
Basophils Absolute: 0.1 10*3/uL (ref 0.0–0.1)
Basophils Relative: 2 %
EOS ABS: 0.1 10*3/uL (ref 0.0–0.5)
EOS PCT: 3 %
HEMATOCRIT: 32.5 % — AB (ref 36.0–46.0)
Hemoglobin: 11.4 g/dL — ABNORMAL LOW (ref 12.0–15.0)
Immature Granulocytes: 1 %
Lymphocytes Relative: 37 %
Lymphs Abs: 1.1 10*3/uL (ref 0.7–4.0)
MCH: 36.4 pg — AB (ref 26.0–34.0)
MCHC: 35.1 g/dL (ref 30.0–36.0)
MCV: 103.8 fL — AB (ref 80.0–100.0)
MONOS PCT: 8 %
Monocytes Absolute: 0.3 10*3/uL (ref 0.1–1.0)
Neutro Abs: 1.5 10*3/uL — ABNORMAL LOW (ref 1.7–7.7)
Neutrophils Relative %: 49 %
Platelets: 219 10*3/uL (ref 150–400)
RBC: 3.13 MIL/uL — ABNORMAL LOW (ref 3.87–5.11)
RDW: 14.9 % (ref 11.5–15.5)
WBC: 3 10*3/uL — ABNORMAL LOW (ref 4.0–10.5)
nRBC: 0 % (ref 0.0–0.2)

## 2018-02-15 MED ORDER — HEPARIN SOD (PORK) LOCK FLUSH 100 UNIT/ML IV SOLN
500.0000 [IU] | Freq: Once | INTRAVENOUS | Status: AC
  Administered 2018-02-15: 500 [IU] via INTRAVENOUS

## 2018-02-15 MED ORDER — HEPARIN SOD (PORK) LOCK FLUSH 100 UNIT/ML IV SOLN
INTRAVENOUS | Status: AC
  Filled 2018-02-15: qty 5

## 2018-02-15 MED ORDER — SODIUM CHLORIDE 0.9 % IV SOLN
Freq: Once | INTRAVENOUS | Status: AC
  Administered 2018-02-15: 15:00:00 via INTRAVENOUS
  Filled 2018-02-15: qty 250

## 2018-02-15 MED ORDER — ZOLEDRONIC ACID 4 MG/100ML IV SOLN
4.0000 mg | Freq: Once | INTRAVENOUS | Status: AC
  Administered 2018-02-15: 4 mg via INTRAVENOUS
  Filled 2018-02-15: qty 100

## 2018-03-01 ENCOUNTER — Telehealth: Payer: Self-pay | Admitting: *Deleted

## 2018-03-01 MED ORDER — APIXABAN 5 MG PO TABS: 5.0000 mg | ORAL_TABLET | Freq: Two times a day (BID) | ORAL | 2 refills | Status: DC

## 2018-03-01 NOTE — Telephone Encounter (Signed)
Pt. Called in for refill of Eliquis. RX sent in. Pt. Notified.

## 2018-03-07 ENCOUNTER — Telehealth: Payer: Self-pay | Admitting: *Deleted

## 2018-03-07 MED FILL — IBRANCE 75 MG CAPSULE: 75 | 28 days supply | Qty: 21 | Fill #2

## 2018-03-07 NOTE — Telephone Encounter (Signed)
Pt called with several questions: she keeps getting calls from cancer care copay asst-not sure if it is scam or not. I told her that I will check with alyson about if it is through the [place] she is getting.. The next question is that she has called pharmacy 2 days and the eliquis is not there and ready, she thinks she needs refill of letrozole and wants to make sure she has refills. The other question was that she went to PCP and they wanted her to have prevnar vaccine, DTAP and flu vaccine. She wants to know if it is ok to get them,. I will investigate these issues and let her know. I spoke to Janese Banks and she can get prevnar and TDAP at PCP office. When she comes to our office if she can remind [place] depending on her cbc results we can give her flu shot. I have sent a message to alyson about copay asst fund. [facility] called [facility] and they filled the eliquis yest. Evening-it was an insurance that it could not be filled to then. She has refills on letrozole and she gets 90 day supply. All above has be communicated to patient. alsyon is having her staff to call pt about copay asst. Pt agreeable to above

## 2018-03-15 ENCOUNTER — Encounter
Admission: RE | Admit: 2018-03-15 | Discharge: 2018-03-15 | Disposition: A | Discharge status: Home / Self Care | Payer: Medicare Other | Source: Ambulatory Visit | Attending: Oncology | Admitting: Oncology

## 2018-03-15 DIAGNOSIS — C50912 Malignant neoplasm of unspecified site of left female breast: Secondary | ICD-10-CM | POA: Diagnosis present

## 2018-03-15 DIAGNOSIS — Z170 Estrogen receptor positive status [ER+]: Secondary | ICD-10-CM | POA: Diagnosis present

## 2018-03-15 DIAGNOSIS — C7951 Secondary malignant neoplasm of bone: Secondary | ICD-10-CM | POA: Diagnosis not present

## 2018-03-15 MED ORDER — TECHNETIUM TC 99M MEDRONATE IV KIT
23.4100 | PACK | Freq: Once | INTRAVENOUS | Status: AC | PRN
  Administered 2018-03-15: 23.41 via INTRAVENOUS

## 2018-03-17 ENCOUNTER — Ambulatory Visit
Admission: RE | Admit: 2018-03-17 | Discharge: 2018-03-17 | Disposition: A | Discharge status: Home / Self Care | Payer: Medicare Other | Source: Ambulatory Visit | Attending: Oncology | Admitting: Oncology

## 2018-03-17 DIAGNOSIS — Z170 Estrogen receptor positive status [ER+]: Secondary | ICD-10-CM | POA: Insufficient documentation

## 2018-03-17 DIAGNOSIS — C7951 Secondary malignant neoplasm of bone: Secondary | ICD-10-CM | POA: Diagnosis present

## 2018-03-17 DIAGNOSIS — C50912 Malignant neoplasm of unspecified site of left female breast: Secondary | ICD-10-CM | POA: Diagnosis present

## 2018-03-17 MED ORDER — IOPAMIDOL (ISOVUE-300) INJECTION 61%
100.0000 mL | Freq: Once | INTRAVENOUS | Status: AC | PRN
  Administered 2018-03-17: 100 mL via INTRAVENOUS

## 2018-03-23 ENCOUNTER — Other Ambulatory Visit: Payer: Self-pay

## 2018-03-23 DIAGNOSIS — C7951 Secondary malignant neoplasm of bone: Secondary | ICD-10-CM

## 2018-03-24 ENCOUNTER — Encounter: Payer: Self-pay | Admitting: Oncology

## 2018-03-24 ENCOUNTER — Inpatient Hospital Stay: Payer: Medicare Other

## 2018-03-24 ENCOUNTER — Inpatient Hospital Stay: Payer: Medicare Other | Attending: Oncology | Admitting: Oncology

## 2018-03-24 VITALS — BP 150/77 | HR 73 | Temp 97.8°F | Resp 18 | Ht 62.0 in | Wt 205.1 lb

## 2018-03-24 DIAGNOSIS — Z170 Estrogen receptor positive status [ER+]: Secondary | ICD-10-CM

## 2018-03-24 DIAGNOSIS — Z79899 Other long term (current) drug therapy: Secondary | ICD-10-CM | POA: Diagnosis not present

## 2018-03-24 DIAGNOSIS — C7951 Secondary malignant neoplasm of bone: Secondary | ICD-10-CM | POA: Diagnosis not present

## 2018-03-24 DIAGNOSIS — C50912 Malignant neoplasm of unspecified site of left female breast: Secondary | ICD-10-CM | POA: Insufficient documentation

## 2018-03-24 DIAGNOSIS — Z79811 Long term (current) use of aromatase inhibitors: Secondary | ICD-10-CM | POA: Diagnosis not present

## 2018-03-24 DIAGNOSIS — Z23 Encounter for immunization: Secondary | ICD-10-CM | POA: Diagnosis not present

## 2018-03-24 LAB — CBC WITH DIFFERENTIAL/PLATELET
Abs Immature Granulocytes: 0 10*3/uL (ref 0.00–0.07)
Basophils Absolute: 0.1 10*3/uL (ref 0.0–0.1)
Basophils Relative: 2 %
EOS ABS: 0.1 10*3/uL (ref 0.0–0.5)
Eosinophils Relative: 3 %
HCT: 34.3 % — ABNORMAL LOW (ref 36.0–46.0)
HEMOGLOBIN: 12 g/dL (ref 12.0–15.0)
Immature Granulocytes: 0 %
Lymphocytes Relative: 39 %
Lymphs Abs: 1 10*3/uL (ref 0.7–4.0)
MCH: 36.1 pg — ABNORMAL HIGH (ref 26.0–34.0)
MCHC: 35 g/dL (ref 30.0–36.0)
MCV: 103.3 fL — AB (ref 80.0–100.0)
Monocytes Absolute: 0.2 10*3/uL (ref 0.1–1.0)
Monocytes Relative: 7 %
Neutro Abs: 1.2 10*3/uL — ABNORMAL LOW (ref 1.7–7.7)
Neutrophils Relative %: 49 %
Platelets: 290 10*3/uL (ref 150–400)
RBC: 3.32 MIL/uL — ABNORMAL LOW (ref 3.87–5.11)
RDW: 14.2 % (ref 11.5–15.5)
WBC: 2.4 10*3/uL — ABNORMAL LOW (ref 4.0–10.5)
nRBC: 0 % (ref 0.0–0.2)

## 2018-03-24 LAB — COMPREHENSIVE METABOLIC PANEL
ALT: 29 U/L (ref 0–44)
AST: 29 U/L (ref 15–41)
Albumin: 4.1 g/dL (ref 3.5–5.0)
Alkaline Phosphatase: 52 U/L (ref 38–126)
Anion gap: 7 (ref 5–15)
BUN: 11 mg/dL (ref 8–23)
CO2: 25 mmol/L (ref 22–32)
Calcium: 9.5 mg/dL (ref 8.9–10.3)
Chloride: 106 mmol/L (ref 98–111)
Creatinine, Ser: 0.93 mg/dL (ref 0.44–1.00)
GFR calc Af Amer: >60 mL/min (ref >60)
GFR calc non Af Amer: >60 mL/min (ref >60)
Glucose, Bld: 122 mg/dL — ABNORMAL HIGH (ref 70–99)
POTASSIUM: 3.8 mmol/L (ref 3.5–5.1)
Sodium: 138 mmol/L (ref 135–145)
Total Bilirubin: 1 mg/dL (ref 0.3–1.2)
Total Protein: 6.9 g/dL (ref 6.5–8.1)

## 2018-03-24 MED ORDER — SODIUM CHLORIDE 0.9% FLUSH
10.0000 mL | INTRAVENOUS | Status: DC | PRN
  Administered 2018-03-24: 10 mL via INTRAVENOUS
  Filled 2018-03-24: qty 10

## 2018-03-24 MED ORDER — HEPARIN SOD (PORK) LOCK FLUSH 100 UNIT/ML IV SOLN
500.0000 [IU] | Freq: Once | INTRAVENOUS | Status: AC
  Administered 2018-03-24: 500 [IU] via INTRAVENOUS
  Filled 2018-03-24: qty 5

## 2018-03-24 NOTE — Progress Notes (Signed)
No new changes noted today 

## 2018-03-25 LAB — CANCER ANTIGEN 27.29: CA 27.29: 79.8 U/mL — ABNORMAL HIGH (ref 0.0–38.6)

## 2018-03-25 NOTE — Progress Notes (Signed)
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  Discussed the results of CT scan  Heme/Onc history: 1. Patient is a 77 year old female with no significant comorbidities who noticed left breast mass about 6-9 months ago. She thought it would go awaybut it continued to increase in size and began to involve her skin. She also noted tenderness to palpation as well as intermittent sharp tingling pain. She was seen by Dr. Adora Fridge on 08/25/2016 and patient had a biopsy of her skin in his office which showed:DIAGNOSIS:  A. LEFT BREAST SKIN; EXCISION:  - INVASIVE CARCINOMA MORPHOLOGICALLY CONSISTENT WITH MAMMARY ORIGIN  INVOLVING THE DERMIS.   BREAST BIOMARKER TESTS  Estrogen Receptor (ER) Status: POSITIVE, >90%  Progesterone Receptor (PgR) Status: POSITIVE, >70%  Her2 negative  2. Patient underwent bilateral diagnostic mammogram on 08/31/2016 showed:IMPRESSION: 1. Known left breast cancer, retroareolar with probable extension to the nipple, measuring 4.3 cm greatest dimension by ultrasound, with associated architectural distortion and associated left nipple retraction, and with associated diffuse skin thickening on the left. 2. No enlarged or morphologically abnormal lymph nodes are seen in the left axilla by ultrasound. 3. No evidence of malignancy within the right breast.  3. Patient was also complaining of some perirectal pain and underwent CT abdomen pelvis with contrast on 09/02/2016 which showed: IMPRESSION: Left colonic diverticulosis. No active  diverticulitis.Fatty liver. No acute findings or evidence of metastatic disease in the abdomen or pelvis. Diffuse bony sclerotic metastases.  4. Other than that her breast pain patient overall feels well. Denies any unintentional weight loss. She continues to be active and care for her great-grandchildren. She has had a hysterectomy in the past. No family history of breast or ovarian cancer.  5. Given that she had inflammatory breast cancer- plan was to give 4 cycles of dose dense AC followed by possible mastectomy and treat bone mets with AI + ibrance  6. PET CT scan on 09/11/16 showed: IMPRESSION: 1. Low-grade but abnormal hypermetabolic activity associated with the cutaneous and sub areolar glandular tissues of the left breast, compatible with malignancy, representative SUV 3.9 compared to contralateral normal side 1.4. 2. Diffuse sclerotic osseous metastatic disease with low-grade hypermetabolic activity, a representative SUV along the left sacrum 5.4. 3. Other imaging findings of potential clinical significance: Aortic Atherosclerosis (ICD10-I70.0). Descending and sigmoid colon Diverticulosis.  7. Baseline MUGA scan showed EF of 71%. Baseline CA 27.29 elevated at 563.2  8. 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  9. Letrozole and ibrance started in sept 2018. zometa monthly also started. Baseline bone density scan normal  10. ibrance held in oct 2018 after cycle 1 due to Grade 3 neutropenia and thrombocytopenia.cycle 2 restaretd on 02/04/17 at 100 mg. Patient continued to have prolongeed grade 3 neutropenia and dose lowered to 75 mg  11. Patient had extensive RLE DVT in April 2019 and is currently on eliquis. She also underwent venous thrombectomy and thrombolytic therapy and IVC placement by Dr. Lucky Cowboy.She also has peripheral vascular disease secondary to atherosclerosis and underwent stenting with vascular surgery as well   Interval history-she is  tolerating Ibrance well  and reports no fatigue or diarrhea or skin rash.  Her toe pain from peripheral vascular disease is also relatively stable  ECOG PS- 1 Pain scale- 0 Opioid associated constipation- no  Review of systems- Review of Systems  Constitutional: Negative for chills, fever, malaise/fatigue and weight loss.  HENT: Negative for congestion, ear discharge and nosebleeds.   Eyes: Negative for blurred vision.  Respiratory: Negative for cough, hemoptysis, sputum production, shortness of breath and wheezing.   Cardiovascular: Negative for chest pain, palpitations, orthopnea and claudication.  Gastrointestinal: Negative for abdominal pain, blood in stool, constipation, diarrhea, heartburn, melena, nausea and vomiting.  Genitourinary: Negative for dysuria, flank pain, frequency, hematuria and urgency.  Musculoskeletal: Negative for back pain, joint pain and myalgias.  Skin: Negative for rash.  Neurological: Negative for dizziness, tingling, focal weakness, seizures, weakness and headaches.  Endo/Heme/Allergies: Does not bruise/bleed easily.  Psychiatric/Behavioral: Negative for depression and suicidal ideas. The patient does not have insomnia.       No Known Allergies   Past Medical History:  Diagnosis Date  . Breast cancer ([place])   . Cancer ([place])    left breast  . DVT (deep venous thrombosis) ([place])    2019  . Hypertension   . Port catheter in place 09/14/2016   Placed 09/10/2016 RT chest.     Past Surgical History:  Procedure Laterality Date  . ABDOMINAL HYSTERECTOMY    . APPENDECTOMY    . BREAST BIOPSY    . CYST EXCISION Right 09/2016   Buttocks  . IVC FILTER REMOVAL N/A 01/17/2018   Procedure: IVC FILTER REMOVAL;  Surgeon: Algernon Huxley, MD;  Location: [facility];  Service: Cardiovascular;  Laterality: N/A;  . LOWER EXTREMITY ANGIOGRAPHY Right 11/11/2017   Procedure: LOWER EXTREMITY ANGIOGRAPHY;  Surgeon: Katha Cabal, MD;  Location: [facility];  Service: Cardiovascular;  Laterality: Right;  . PERIPHERAL VASCULAR THROMBECTOMY Right 08/30/2017   Procedure: PERIPHERAL VASCULAR THROMBECTOMY;  Surgeon: Algernon Huxley, MD;  Location: [facility] [facility];  Service: Cardiovascular;  Laterality: Right;  . PORTACATH PLACEMENT Right 09/10/2016   Procedure: INSERTION PORT-A-CATH;  Surgeon: Jules Husbands, MD;  Location: [facility];  Service: General;  Laterality: Right;    Social History   Socioeconomic History  . Marital status: Married    Spouse name: Jeneen Rinks  . Number of children: 2  . Years of education: 6  . Highest education level: Bachelor's degree (e.g., BA, AB, BS)  Occupational History  . 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[employer]  Social Needs  . Financial resource strain: 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  . Food insecurity:    Worry: Never true    Inability: Never true  . Transportation needs:    Medical: No    Non-medical: No  Tobacco Use  . Smoking status: Never Smoker  . Smokeless tobacco: Never Used  Substance and Sexual Activity  . Alcohol use: No  . Drug use: No  . Sexual activity: Never  Lifestyle  . Physical activity:    Days per week: Patient refused    Minutes per session: Patient refused  . Stress: Not at all  Relationships  . Social connections:    Talks on phone: More than three times a week    Gets together: Once a week    Attends religious service: More than 4 times per year    Active member of club or organization: No    Attends meetings of clubs  or organizations: Never    Relationship status: Not on file  . Intimate partner violence:    Fear of current or ex partner: Not on file    Emotionally abused: Not on file    Physically abused: Not on file    Forced sexual activity: Not on file  Other Topics Concern  . Not on file  Social History Narrative  . Not on file    Family History  Problem Relation Age of Onset  . Parkinson's disease Mother   . Bladder Cancer Father   . Lung  cancer Father      Current Outpatient Medications:  .  amLODipine (NORVASC) 10 MG tablet, Take 10 mg by mouth daily., Disp: , Rfl:  .  apixaban (ELIQUIS) 5 MG TABS tablet, Take 1 tablet (5 mg total) by mouth 2 (two) times daily., Disp: 60 tablet, Rfl: 2 .  aspirin EC 81 MG tablet, Take 1 tablet (81 mg total) by mouth daily., Disp: 150 tablet, Rfl: 2 .  Calcium Carb-Cholecalciferol (CALCIUM 600/VITAMIN D3 PO), Take 1 tablet by mouth daily., Disp: , Rfl:  .  hydrochlorothiazide (MICROZIDE) 12.5 MG capsule, Take 12.5 mg by mouth daily. , Disp: , Rfl:  .  IBRANCE 75 MG capsule, TAKE 1 CAPSULE (75 MG TOTAL) BY MOUTH DAILY WITH BREAKFAST. TAKE FOR 21 DAYS ON, THEN 7 DAYS OFF., Disp: 21 capsule, Rfl: 3 .  letrozole (FEMARA) 2.5 MG tablet, Take 1 tablet (2.5 mg total) by mouth daily., Disp: 90 tablet, Rfl: 3 .  Vitamin D, Cholecalciferol, 400 units CAPS, Take 400 Units by mouth daily. , Disp: , Rfl:  .  acetaminophen (TYLENOL) 500 MG tablet, Take 500 mg by mouth daily as needed for moderate pain or headache. , Disp: , Rfl:  .  lidocaine-prilocaine (EMLA) cream, Apply 1 application topically as needed (port access). (Patient not taking: Reported on 03/24/2018), Disp: 30 g, Rfl: 2 .  oxyCODONE-acetaminophen (PERCOCET/ROXICET) 5-325 MG tablet, Take 1 tablet by mouth every 6 (six) hours as needed for moderate pain or severe pain. (Patient not taking: Reported on 11/25/2017), Disp: 28 tablet, Rfl: 0 .  PREVIDENT 5000 BOOSTER PLUS 1.1 % PSTE, Place 1 application onto teeth 2 (two) times daily. , Disp: , Rfl:  .  prochlorperazine (COMPAZINE) 10 MG tablet, Take 1 tablet (10 mg total) by mouth every 6 (six) hours as needed (Nausea or vomiting). (Patient not taking: Reported on 11/25/2017), Disp: 30 tablet, Rfl: 1  Physical exam:  Vitals:   03/24/18 1336  BP: (!) 150/77  Pulse: 73  Resp: 18  Temp: 97.8 F (36.6 C)  TempSrc: Tympanic  SpO2: 97%  Weight: 205 lb 1.6 oz (93 kg)  Height: 5' 2" (1.575 m)    Physical Exam Constitutional:      General: She is not in acute distress. HENT:     Head: Normocephalic and atraumatic.  Eyes:     Pupils: Pupils are equal, round, and reactive to light.  Neck:     Musculoskeletal: Normal range of motion.  Cardiovascular:     Rate and Rhythm: Normal rate and regular rhythm.     Heart sounds: Normal heart sounds.  Pulmonary:     Effort: Pulmonary effort is normal.     Breath sounds: Normal breath sounds.  Abdominal:     General: Bowel sounds are normal.     Palpations: Abdomen is soft.  Skin:    General: Skin is warm and dry.  Neurological:     Mental Status:   She is alert and oriented to person, place, and time.   There is induration noted over the skin of the left breast without a significant palpable left breast mass  CMP Latest Ref Rng & Units 03/24/2018  Glucose 70 - 99 mg/dL 122(H)  BUN 8 - 23 mg/dL 11  Creatinine 0.44 - 1.00 mg/dL 0.93  Sodium 135 - 145 mmol/L 138  Potassium 3.5 - 5.1 mmol/L 3.8  Chloride 98 - 111 mmol/L 106  CO2 22 - 32 mmol/L 25  Calcium 8.9 - 10.3 mg/dL 9.5  Total Protein 6.5 - 8.1 g/dL 6.9  Total Bilirubin 0.3 - 1.2 mg/dL 1.0  Alkaline Phos 38 - 126 U/L 52  AST 15 - 41 U/L 29  ALT 0 - 44 U/L 29   CBC Latest Ref Rng & Units 03/24/2018  WBC 4.0 - 10.5 K/uL 2.4(L)  Hemoglobin 12.0 - 15.0 g/dL 12.0  Hematocrit 36.0 - 46.0 % 34.3(L)  Platelets 150 - 400 K/uL 290    No images are attached to the encounter.  Ct Chest W Contrast  Result Date: 03/17/2018 CLINICAL DATA:  Stage IV breast cancer with bone metastasis diagnosed last year. On chemotherapy and radiation therapy. Nausea. Appendectomy. EXAM: CT CHEST, ABDOMEN, AND PELVIS WITH CONTRAST TECHNIQUE: Multidetector CT imaging of the chest, abdomen and pelvis was performed following the standard protocol during bolus administration of intravenous contrast. CONTRAST:  100mL ISOVUE-300 IOPAMIDOL (ISOVUE-300) INJECTION 61% COMPARISON:  11/23/2017 FINDINGS: CT  CHEST FINDINGS Cardiovascular: Right Port-A-Cath tip at superior caval/atrial junction. Tortuous thoracic aorta. Mild cardiomegaly, without pericardial effusion. No central pulmonary embolism, on this non-dedicated study. Mediastinum/Nodes: No supraclavicular adenopathy. No axillary adenopathy. No mediastinal or hilar adenopathy. Tiny hiatal hernia. No internal mammary adenopathy. Lungs/Pleura: No pleural fluid. Isolated 2 mm nodule along the right major fissure is similar and likely a subpleural lymph node. Musculoskeletal: Diffuse sclerotic metastasis are similar. CT ABDOMEN PELVIS FINDINGS Hepatobiliary: Lateral segment left and caudate lobe prominence. Subtle irregular hepatic capsule. A lateral segment left liver lobe 7 mm lesion, including image 54/2, is felt to be similar to prior exams including 09/02/2016, when correlated with coronal reformats. Normal gallbladder, without biliary ductal dilatation. Pancreas: Normal, without mass or ductal dilatation. Spleen: Normal in size, without focal abnormality. Adrenals/Urinary Tract: Normal adrenal glands. Mild renal cortical thinning bilaterally. No hydronephrosis. Normal urinary bladder. Stomach/Bowel: Normal stomach, without wall thickening. Extensive colonic diverticulosis. Normal terminal ileum. Normal small bowel. Vascular/Lymphatic: Advanced aortic and branch vessel atherosclerosis. Circumaortic left renal vein. No evidence of portal venous hypertension. No abdominopelvic adenopathy. Reproductive: Hysterectomy.  No adnexal mass. Other: No significant free fluid. No evidence of omental or peritoneal disease. Musculoskeletal: Relatively diffuse sclerotic osseous metastasis are similar. L4 limbus type vertebrae IMPRESSION: 1. Widespread osseous metastasis, similar. 2. No evidence of soft tissue metastasis within the chest, abdomen, or pelvis. 3. Suspicion of mild cirrhosis. A lateral segment left liver lobe lesion is felt to be present over priors, more apparent  today secondary to slice selection. Recommend attention on follow-up. 4.  Aortic Atherosclerosis (ICD10-I70.0). 5. Interval removal of IVC filter. Electronically Signed   By: Kyle  Talbot M.D.   On: 03/17/2018 15:38   Nm Bone Scan Whole Body  Result Date: 03/16/2018 CLINICAL DATA:  77-year-old female with left breast cancer restaging. History of widespread sclerotic bone metastases. EXAM: NUCLEAR MEDICINE WHOLE BODY BONE SCAN TECHNIQUE: Whole body anterior and posterior images were obtained approximately 3 hours after intravenous injection of radiopharmaceutical. RADIOPHARMACEUTICALS:  23.4 mCi Technetium-99m MDP IV   COMPARISON:  CT chest abdomen and pelvis 11/23/2017, whole-body bone scans 11/23/2017 and 05/14/2017. FINDINGS: Radiotracer activity remains visible in both kidneys in the urinary bladder. Diffuse sclerotic heterogeneous metastatic disease to bone is demonstrated throughout the entire skeleton visible on the chest abdomen and pelvis CT in August. On today's bone scan there is persistent generalized increased radiotracer activity throughout the spine (especially the thoracic and lumbosacral segments). Similar increased activity within the skull, sternum, at both acetabula, and mildly asymmetric increased activity at the left proximal femur. The bilateral ribs and visible appendicular skeleton are less affected, although increased activity at both shoulders, knees, and feet probably represents a combination of metastatic disease and degenerative change. Overall the bone scan appearance has not significantly changed since February 2019. IMPRESSION: Diffuse skeletal metastases. Whole-body bone scan appearance remains stable since February this year and continues to substantially underestimate the extent of disease. Electronically Signed   By: Genevie Ann M.D.   On: 03/16/2018 08:38   Ct Abdomen Pelvis W Contrast  Result Date: 03/17/2018 CLINICAL DATA:  Stage IV breast cancer with bone metastasis  diagnosed last year. On chemotherapy and radiation therapy. Nausea. Appendectomy. EXAM: CT CHEST, ABDOMEN, AND PELVIS WITH CONTRAST TECHNIQUE: Multidetector CT imaging of the chest, abdomen and pelvis was performed following the standard protocol during bolus administration of intravenous contrast. CONTRAST:  167m ISOVUE-300 IOPAMIDOL (ISOVUE-300) INJECTION 61% COMPARISON:  11/23/2017 FINDINGS: CT CHEST FINDINGS Cardiovascular: Right Port-A-Cath tip at superior caval/atrial junction. Tortuous thoracic aorta. Mild cardiomegaly, without pericardial effusion. No central pulmonary embolism, on this non-dedicated study. Mediastinum/Nodes: No supraclavicular adenopathy. No axillary adenopathy. No mediastinal or hilar adenopathy. Tiny hiatal hernia. No internal mammary adenopathy. Lungs/Pleura: No pleural fluid. Isolated 2 mm nodule along the right major fissure is similar and likely a subpleural lymph node. Musculoskeletal: Diffuse sclerotic metastasis are similar. CT ABDOMEN PELVIS FINDINGS Hepatobiliary: Lateral segment left and caudate lobe prominence. Subtle irregular hepatic capsule. A lateral segment left liver lobe 7 mm lesion, including image 54/2, is felt to be similar to prior exams including 09/02/2016, when correlated with coronal reformats. Normal gallbladder, without biliary ductal dilatation. Pancreas: Normal, without mass or ductal dilatation. Spleen: Normal in size, without focal abnormality. Adrenals/Urinary Tract: Normal adrenal glands. Mild renal cortical thinning bilaterally. No hydronephrosis. Normal urinary bladder. Stomach/Bowel: Normal stomach, without wall thickening. Extensive colonic diverticulosis. Normal terminal ileum. Normal small bowel. Vascular/Lymphatic: Advanced aortic and branch vessel atherosclerosis. Circumaortic left renal vein. No evidence of portal venous hypertension. No abdominopelvic adenopathy. Reproductive: Hysterectomy.  No adnexal mass. Other: No significant free fluid. No  evidence of omental or peritoneal disease. Musculoskeletal: Relatively diffuse sclerotic osseous metastasis are similar. L4 limbus type vertebrae IMPRESSION: 1. Widespread osseous metastasis, similar. 2. No evidence of soft tissue metastasis within the chest, abdomen, or pelvis. 3. Suspicion of mild cirrhosis. A lateral segment left liver lobe lesion is felt to be present over priors, more apparent today secondary to slice selection. Recommend attention on follow-up. 4.  Aortic Atherosclerosis (ICD10-I70.0). 5. Interval removal of IVC filter. Electronically Signed   By: KAbigail MiyamotoM.D.   On: 03/17/2018 15:38     Assessment and plan- Patient is a 77y.o. female with metastatic breast cancer ER positive with bone only metastases currently on letrozole and Ibrance  I have reviewed CT chest abdomen pelvis as well as bone scan images independently and discussed findings with the patient. Overall her bone disease is stable and there is no new finding of metastatic disease on her scans.  Also her tumor marker CA-27-29 is slowly trending down.  At this point she will continue Ibrance and letrozole.  Today her CBC shows a white count of 2.4 and ANC of 1.3.  Given that her ANC is more than 1 she can continue Ibrance at the same dose which is the lowest dose of 75 mg 3 weeks on and one-week off.  She will take letrozole along with calcium and vitamin D as well.  Patient has been receiving monthly Zometa so far but since she has now completed 1 year I will switch her to every 3 months of Zometa at this time.  She last received Zometa in november 2019 and will need her next dose in February.  I will see her back on 05/05/2017 with CBC CMP and CA-27-29.    Visit Diagnosis 1. Malignant neoplasm of left breast in female, estrogen receptor positive, unspecified site of breast (HCC)   2. Bone metastases (HCC)      Dr. Archana Rao, MD, MPH [facility] [phone] 03/25/2018 10:57  AM                

## 2018-04-01 MED FILL — IBRANCE 75 MG CAPSULE: 75 | 28 days supply | Qty: 21 | Fill #3

## 2018-04-04 ENCOUNTER — Telehealth: Payer: Self-pay | Admitting: *Deleted

## 2018-04-04 ENCOUNTER — Inpatient Hospital Stay: Payer: Medicare Other

## 2018-04-04 VITALS — BP 167/87 | HR 76 | Temp 98.5°F | Resp 18

## 2018-04-04 DIAGNOSIS — Z23 Encounter for immunization: Secondary | ICD-10-CM

## 2018-04-04 DIAGNOSIS — C50912 Malignant neoplasm of unspecified site of left female breast: Secondary | ICD-10-CM | POA: Diagnosis not present

## 2018-04-04 MED ORDER — INFLUENZA VAC SPLIT HIGH-DOSE 0.5 ML IM SUSY
0.5000 mL | PREFILLED_SYRINGE | Freq: Once | INTRAMUSCULAR | Status: AC
  Administered 2018-04-04: 0.5 mL via INTRAMUSCULAR

## 2018-04-04 NOTE — Telephone Encounter (Signed)
Pt here for flu shot and she gave me a paper that states she went to dentist Dr. Thereasa Parkin and he says she needs tooth extraction and was sent to [facility] Dr. Hampton Abbot in [facility] 04/18/2018.  She wants to know if she needs to do anything special with her eliquis and ibrance.  She is due to start her new cycle of Ibrance 12/28.  I have called Dr. Hampton Abbot office and got voicemail and asked them to call back with information such as is this appt 1/6 a consult or will be have tooth extraction on that date and make them aware that she is on eliquis as well as Ibrance. Will await call back

## 2018-04-04 NOTE — Patient Instructions (Signed)

## 2018-04-07 ENCOUNTER — Telehealth: Payer: Self-pay | Admitting: *Deleted

## 2018-04-07 NOTE — Telephone Encounter (Signed)
I did get a call back from the dentist office for Ms. Victoria Steele.  They tell me that her appointment on January 6 is a consultation only.  Once they figure out what ever needs to be done in order to take care of her tooth they will let us know and then we will have to direct them in regards to her Eliquis and her Ibrance.  Told patient that I will get in touch with her as soon as I get the answer of what date and time so that we can work out what is going to work based on what her counts are and the dentist office will not move ahead without our approval.  Patient agreeable and will await phone call back

## 2018-04-25 ENCOUNTER — Other Ambulatory Visit: Payer: Self-pay | Admitting: Oncology

## 2018-04-25 DIAGNOSIS — Z170 Estrogen receptor positive status [ER+]: Principal | ICD-10-CM

## 2018-04-25 DIAGNOSIS — C50912 Malignant neoplasm of unspecified site of left female breast: Secondary | ICD-10-CM

## 2018-04-25 NOTE — Telephone Encounter (Signed)
use   Ref Range & Units 66mo ago (03/24/18) 37mo ago (02/15/18) 27mo ago (01/13/18) 67mo ago (12/15/17) 19mo ago (11/18/17) 28mo ago (10/28/17) 11mo ago (09/30/17)  WBC 4.0 - 10.5 K/uL 2.4Low   3.0Low   2.5Low  R 2.7Low  R 2.5Low  R 3.6 R 2.8Low  R  RBC 3.87 - 5.11 MIL/uL 3.32Low   3.13Low   3.14Low  R 3.43Low  R 3.46Low  R 3.62Low  R 3.36Low  R  Hemoglobin 12.0 - 15.0 g/dL 12.0  11.4Low   11.7Low  R 12.6 R 12.7 R 13.0 R 12.2 R  HCT 36.0 - 46.0 % 34.3Low   32.5Low   33.2Low  R 36.1 R 36.5 R 37.3 R 34.7Low  R  MCV 80.0 - 100.0 fL 103.3High   103.8High   105.6High   105.0High   105.3High   103.1High   103.1High    MCH 26.0 - 34.0 pg 36.1High   36.4High   37.2High   36.6High   36.8High   36.0High   36.4High    MCHC 30.0 - 36.0 g/dL 35.0  35.1  35.2 R 34.8 R 34.9 R 34.9 R 35.3 R  RDW 11.5 - 15.5 % 14.2  14.9  16.3High  R 16.1High  R 16.4High  R 16.5High  R 16.7High  R  Platelets 150 - 400 K/uL 290  219  218 R 238 R 244 R 310 R 309 R  nRBC 0.0 - 0.2 % 0.0  0.0        Neutrophils Relative % % 49  49  52  55  55  61  62   Neutro Abs 1.7 - 7.7 K/uL 1.2Low   1.5Low   1.3Low  R 1.5 R 1.4 R 2.2 R 1.7 R  Lymphocytes Relative % 39  37  34  33  35  31  28   Lymphs Abs 0.7 - 4.0 K/uL 1.0  1.1  0.8Low  R 0.9Low  R 0.9Low  R 1.1 R 0.8Low  R  Monocytes Relative % 7  8  11  9  9  4  5    Monocytes Absolute 0.1 - 1.0 K/uL 0.2  0.3  0.3 R 0.2 R 0.2 R 0.2 R 0.1Low  R  Eosinophils Relative % 3  3  1  1  1  1  2    Eosinophils Absolute 0.0 - 0.5 K/uL 0.1  0.1  0.0 R 0.0 R 0.0 R 0.0 R 0.0 R  Basophils Relative % 2  2  2  2   0  3  3   Basophils Absolute 0.0 - 0.1 K/uL 0.1  0.1  0.1 R, CM 0.1 R, CM 0.0 R, CM 0.1 R, CM 0.1 R, CM  Immature Granulocytes % 0  1        Abs Immature Granulocytes 0.00 - 0.07 K/uL 0.00  0.02 CM       Comment: Performed at [facility], [address] [place], [place] [address]  Resulting Agency  [facility] [place] [facility] [place] [facility] [place] [facility] [place] [facility] [place] [facility] [place] [facility]      Specimen  Collected: 03/24/18 13:07  Last Resulted: 03/24/18 13:20     Lab Flowsheet    Order Details    View Encounter    Lab and Collection Details    Routing    Result History      CM=Additional commentsR=Reference range differs from displayed range  Other Results from 03/24/2018   Contains abnormal data Cancer antigen 27.29  Order: [id number]   Status:  Final result  Visible to patient:  No (Not Released)  Next appt:  05/05/2018 at 02:00 PM in Oncology ([facility])  Dx:  Bone metastases ([place])   Ref Range & Units 98mo ago (03/24/18) 68mo ago (01/13/18) 21mo ago (12/15/17) 63mo ago (11/18/17) 61mo ago (10/28/17) 37mo ago (09/30/17) 68mo ago (09/02/17)  CA 27.29 0.0 - 38.6 U/mL 79.8High   85.8High  CM 96.2High  CM 87.9High  CM 88.9High  CM 90.8High  CM 73.4High  CM  Comment: (NOTE)  [employer] Immunochemiluminometric Methodology [facility])  Values obtained with different assay methods or kits cannot be used  interchangeably. Results cannot be interpreted as absolute evidence  of the presence or absence of malignant disease.  Performed At: [facility]  [address] [place], [place] [id number]  Rush Farmer MD DX:[id number]   Resulting Agency  [facility] CL[facility] [place] [facility] [place] [facility] [place] [facility] [place] [facility] [place] [facility] LAB      Specimen Collected: 03/24/18 13:07  Last Resulted: 03/25/18 05:37     Lab Flowsheet    Order Details    View Encounter    Lab and Collection Details    Routing    Result History      CM=Additional comments        Contains abnormal data Comprehensive metabolic panel  Order: [id number]   Status:  Final result  Visible to patient:  No (Not Released)  Next appt:  05/05/2018 at 02:00 PM in Oncology ([facility])  Dx:  High risk medication use   Ref Range & Units 46mo ago (03/24/18) 39mo ago (02/15/18) 59mo ago (01/13/18) 26mo ago (12/15/17) 33mo ago (11/18/17) 53mo ago (10/28/17) 72mo ago (09/30/17)  Sodium 135 - 145 mmol/L 138   139  139  137  139  138  138   Potassium 3.5 - 5.1 mmol/L 3.8  3.8  3.5  3.5  3.7  3.8  3.7   Chloride 98 - 111 mmol/L 106  107  105  101  103  105 CM 104 R  CO2 22 - 32 mmol/L 25  24  24  26  23  23  24    Glucose, Bld 70 - 99 mg/dL 122High   129High   131High   125High   124High   129High  CM 111High  R  BUN 8 - 23 mg/dL 11  13  10  11  12  14  CM 11 R  Creatinine, Ser 0.44 - 1.00 mg/dL 0.93  0.80  0.84  0.82  0.99  0.97  0.85   Calcium 8.9 - 10.3 mg/dL 9.5  9.6  9.9  10.3  10.2  10.3  10.2   Total Protein 6.5 - 8.1 g/dL 6.9  6.9  7.0  7.4  7.5  7.4  7.2   Albumin 3.5 - 5.0 g/dL 4.1  4.1  4.1  4.3  4.2  4.3  4.3   AST 15 - 41 U/L 29  33  34  31  29  29  29    ALT 0 - 44 U/L 29  33  28  28  19  26  CM 24 R  Alkaline Phosphatase 38 - 126 U/L 52  53  55  58  57  64  62   Total Bilirubin 0.3 - 1.2 mg/dL 1.0  0.8  1.2  1.0  0.8  1.0  1.0   GFR calc non Af Amer >60 mL/min >60  >60  >60  >60  55Low   56Low   >60   GFR calc Af Amer >60 mL/min >60  >60 CM >60 CM >60 CM >60 CM >60 CM >60 CM  Anion gap 5 - 15 7  8  CM 10 CM 10 CM 13 CM 10 CM 10 CM  Comment: Performed at [facility], [address] [place], [place] [address]  Resulting Agency  [facility] CLIN LAB [place] [facility] [place] CL[facility] [place] [facility] [place] [facility] [place] [facility] [place] [facility] LAB      Specimen Collected: 03/24/18 13:07  Last Resulted: 03/24/18 13:26

## 2018-04-26 ENCOUNTER — Telehealth: Payer: Self-pay | Admitting: *Deleted

## 2018-04-26 NOTE — Telephone Encounter (Signed)
Called patient to see where she is act in her cycle and she will take last dose ibrance this Friday Jan 17.

## 2018-04-27 ENCOUNTER — Encounter: Payer: Self-pay | Admitting: *Deleted

## 2018-04-27 ENCOUNTER — Telehealth: Payer: Self-pay | Admitting: *Deleted

## 2018-04-27 DIAGNOSIS — Z170 Estrogen receptor positive status [ER+]: Secondary | ICD-10-CM

## 2018-04-27 DIAGNOSIS — Z79899 Other long term (current) drug therapy: Secondary | ICD-10-CM

## 2018-04-27 DIAGNOSIS — C50912 Malignant neoplasm of unspecified site of left female breast: Secondary | ICD-10-CM

## 2018-04-27 NOTE — Telephone Encounter (Signed)
.   Patient called back and she is agreeable to plan she will come and see [place] January 23 at 8:00 for labs.  Then she will see Dr. Janese Banks at 830 on the same day.  She will go for her surgery for tooth extraction Thursday on the same day at 12:00.  Also she will come off of her Eliquis Tuesday Wednesday and then Thursday after her procedure can check to see whether or not she can start back that evening and agreeable to plan

## 2018-04-27 NOTE — Telephone Encounter (Signed)
I called dental office today with Dr. Hampton Abbot spoke to [place].  She states that they can do Ms. Sees's prior to seizure which is to tooth extraction Thursday at 12 noon which will be January 23.  I told lives over the phone that I could change the patient's appointment to see [place] on that same day till the first thing in the morning.  Kathlee Nations is agreeable to this plan.  I then called Ms. Odden and left her a voicemail telling her all above.  I also advised her that if she is going to have the procedure on Thursday with the plan in place if she is agreeable that she will need to stop her Eliquis on Tuesday no doses Wednesday no doses and then Thursday after she has the procedure they can make a decision whether or not she should start Eliquis that evening depending on how much blood she had lost during the procedure and potential for any losses after procedure.  I have asked the patient to call me back and confirm that this is okay with her and if she has any questions she can call me thanks.

## 2018-05-02 MED FILL — IBRANCE 75 MG CAPSULE: 75 | 28 days supply | Qty: 21 | Fill #0

## 2018-05-05 ENCOUNTER — Inpatient Hospital Stay: Payer: Medicare Other

## 2018-05-05 ENCOUNTER — Encounter: Payer: Self-pay | Admitting: Oncology

## 2018-05-05 ENCOUNTER — Inpatient Hospital Stay: Payer: Medicare Other | Admitting: Oncology

## 2018-05-05 ENCOUNTER — Inpatient Hospital Stay (HOSPITAL_BASED_OUTPATIENT_CLINIC_OR_DEPARTMENT_OTHER): Payer: Medicare Other | Admitting: Oncology

## 2018-05-05 ENCOUNTER — Inpatient Hospital Stay: Payer: Medicare Other | Attending: Oncology

## 2018-05-05 VITALS — BP 131/76 | HR 56 | Temp 98.2°F | Resp 18 | Wt 204.2 lb

## 2018-05-05 DIAGNOSIS — I10 Essential (primary) hypertension: Secondary | ICD-10-CM | POA: Insufficient documentation

## 2018-05-05 DIAGNOSIS — Z86718 Personal history of other venous thrombosis and embolism: Secondary | ICD-10-CM

## 2018-05-05 DIAGNOSIS — Z79899 Other long term (current) drug therapy: Secondary | ICD-10-CM

## 2018-05-05 DIAGNOSIS — Z7901 Long term (current) use of anticoagulants: Secondary | ICD-10-CM

## 2018-05-05 DIAGNOSIS — C50912 Malignant neoplasm of unspecified site of left female breast: Secondary | ICD-10-CM

## 2018-05-05 DIAGNOSIS — Z95828 Presence of other vascular implants and grafts: Secondary | ICD-10-CM

## 2018-05-05 DIAGNOSIS — C7951 Secondary malignant neoplasm of bone: Secondary | ICD-10-CM

## 2018-05-05 DIAGNOSIS — Z170 Estrogen receptor positive status [ER+]: Secondary | ICD-10-CM | POA: Insufficient documentation

## 2018-05-05 DIAGNOSIS — Z7982 Long term (current) use of aspirin: Secondary | ICD-10-CM | POA: Insufficient documentation

## 2018-05-05 LAB — COMPREHENSIVE METABOLIC PANEL
ALT: 25 U/L (ref 0–44)
ANION GAP: 8 (ref 5–15)
AST: 26 U/L (ref 15–41)
Albumin: 4.3 g/dL (ref 3.5–5.0)
Alkaline Phosphatase: 55 U/L (ref 38–126)
BUN: 12 mg/dL (ref 8–23)
CO2: 26 mmol/L (ref 22–32)
Calcium: 9.6 mg/dL (ref 8.9–10.3)
Chloride: 106 mmol/L (ref 98–111)
Creatinine, Ser: 0.83 mg/dL (ref 0.44–1.00)
GFR calc Af Amer: >60 mL/min (ref >60)
GFR calc non Af Amer: >60 mL/min (ref >60)
Glucose, Bld: 128 mg/dL — ABNORMAL HIGH (ref 70–99)
Potassium: 3.7 mmol/L (ref 3.5–5.1)
Sodium: 140 mmol/L (ref 135–145)
Total Bilirubin: 1 mg/dL (ref 0.3–1.2)
Total Protein: 7.2 g/dL (ref 6.5–8.1)

## 2018-05-05 LAB — CBC WITH DIFFERENTIAL/PLATELET
Abs Immature Granulocytes: 0.02 10*3/uL (ref 0.00–0.07)
BASOS PCT: 2 %
Basophils Absolute: 0.1 10*3/uL (ref 0.0–0.1)
Eosinophils Absolute: 0.1 10*3/uL (ref 0.0–0.5)
Eosinophils Relative: 3 %
HCT: 36.5 % (ref 36.0–46.0)
Hemoglobin: 12.7 g/dL (ref 12.0–15.0)
IMMATURE GRANULOCYTES: 1 %
Lymphocytes Relative: 36 %
Lymphs Abs: 0.8 10*3/uL (ref 0.7–4.0)
MCH: 36 pg — ABNORMAL HIGH (ref 26.0–34.0)
MCHC: 34.8 g/dL (ref 30.0–36.0)
MCV: 103.4 fL — AB (ref 80.0–100.0)
Monocytes Absolute: 0.2 10*3/uL (ref 0.1–1.0)
Monocytes Relative: 10 %
Neutro Abs: 1.1 10*3/uL — ABNORMAL LOW (ref 1.7–7.7)
Neutrophils Relative %: 48 %
PLATELETS: 206 10*3/uL (ref 150–400)
RBC: 3.53 MIL/uL — ABNORMAL LOW (ref 3.87–5.11)
RDW: 14.6 % (ref 11.5–15.5)
WBC: 2.3 10*3/uL — ABNORMAL LOW (ref 4.0–10.5)
nRBC: 0 % (ref 0.0–0.2)

## 2018-05-05 LAB — PROTIME-INR
INR: 1.04
Prothrombin Time: 13.5 seconds (ref 11.4–15.2)

## 2018-05-05 MED ORDER — HEPARIN SOD (PORK) LOCK FLUSH 100 UNIT/ML IV SOLN
500.0000 [IU] | Freq: Once | INTRAVENOUS | Status: AC
  Administered 2018-05-05: 500 [IU] via INTRAVENOUS

## 2018-05-05 MED ORDER — SODIUM CHLORIDE 0.9% FLUSH
10.0000 mL | INTRAVENOUS | Status: DC | PRN
  Administered 2018-05-05: 10 mL via INTRAVENOUS
  Filled 2018-05-05: qty 10

## 2018-05-05 NOTE — Progress Notes (Signed)
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 Patient is a 78 year old female with no significant comorbidities who noticed left breast mass about 6-9 months ago. She thought it would go awaybut it continued to increase in size and began to involve her skin. She also noted tenderness to palpation as well as intermittent sharp tingling pain. She was seen by Dr. Adora Fridge on 08/25/2016 and patient had a biopsy of her skin in his office which showed:DIAGNOSIS:  A. LEFT BREAST SKIN; EXCISION:  - INVASIVE CARCINOMA MORPHOLOGICALLY CONSISTENT WITH MAMMARY ORIGIN  INVOLVING THE DERMIS.   BREAST BIOMARKER TESTS  Estrogen Receptor (ER) Status: POSITIVE, >90%  Progesterone Receptor (PgR) Status: POSITIVE, >70%  Her2 negative  2. Patient underwent bilateral diagnostic mammogram on 08/31/2016 showed:IMPRESSION: 1. Known left breast cancer, retroareolar with probable extension to the nipple, measuring 4.3 cm greatest dimension by ultrasound, with associated architectural distortion and associated left nipple retraction, and with associated diffuse skin thickening on the left. 2. No enlarged or morphologically abnormal lymph nodes are seen in the left axilla by ultrasound. 3. No evidence of malignancy within the right breast.  3. Patient was also complaining of some perirectal pain and underwent CT abdomen pelvis with contrast on 09/02/2016 which showed: IMPRESSION: Left colonic diverticulosis. No active diverticulitis.Fatty liver. No acute findings or evidence of metastatic  disease in the abdomen or pelvis. Diffuse bony sclerotic metastases.  4. Other than that her breast pain patient overall feels well. Denies any unintentional weight loss. She continues to be active and care for her great-grandchildren. She has had a hysterectomy in the past. No family history of breast or ovarian cancer.  5. Given that she had inflammatory breast cancer- plan was to give 4 cycles of dose dense AC followed by possible mastectomy and treat bone mets with AI + ibrance  6. PET CT scan on 09/11/16 showed: IMPRESSION: 1. Low-grade but abnormal hypermetabolic activity associated with the cutaneous and sub areolar glandular tissues of the left breast, compatible with malignancy, representative SUV 3.9 compared to contralateral normal side 1.4. 2. Diffuse sclerotic osseous metastatic disease with low-grade hypermetabolic activity, a representative SUV along the left sacrum 5.4. 3. Other imaging findings of potential clinical significance: Aortic Atherosclerosis (ICD10-I70.0). Descending and sigmoid colon Diverticulosis.  7. Baseline MUGA scan showed EF of 71%. Baseline CA 27.29 elevated at 563.2  8. 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  9. Letrozole and ibrance started in sept 2018. zometa monthly also started. Baseline bone density scan normal  10. ibrance held in oct 2018 after cycle 1 due to Grade 3 neutropenia and thrombocytopenia.cycle 2 restaretd on 02/04/17 at 100 mg. Patient continued to have prolongeed grade 3 neutropenia and dose lowered to 75 mg  11. Patient had extensive RLE DVT in April 2019 and is currently on eliquis. She also underwent venous thrombectomy and thrombolytic therapy and IVC placement by Dr. Lucky Cowboy.She also has peripheral vascular disease secondary to atherosclerosis and underwent stenting with vascular surgery as well    Interval history-she is doing well on Ibrance.  Reports chronic mild fatigue.  Denies any mouth  sores diarrhea or rash.  She continues to follow-up with vascular surgery for peripheral vascular disease.  She is on Eliquis for her DVT  ECOG PS- 1 Pain scale- 0 Opioid associated constipation- no  Review of systems- Review of Systems  Constitutional: Positive for malaise/fatigue. Negative for chills, fever and weight loss.  HENT: Negative for congestion, ear discharge and nosebleeds.   Eyes: Negative for blurred vision.  Respiratory: Negative for cough, hemoptysis, sputum production, shortness of breath and wheezing.   Cardiovascular: Negative for chest pain, palpitations, orthopnea and claudication.  Gastrointestinal: Negative for abdominal pain, blood in stool, constipation, diarrhea, heartburn, melena, nausea and vomiting.  Genitourinary: Negative for dysuria, flank pain, frequency, hematuria and urgency.  Musculoskeletal: Negative for back pain, joint pain and myalgias.  Skin: Negative for rash.  Neurological: Negative for dizziness, tingling, focal weakness, seizures, weakness and headaches.  Endo/Heme/Allergies: Does not bruise/bleed easily.  Psychiatric/Behavioral: Negative for depression and suicidal ideas. The patient does not have insomnia.      No Known Allergies   Past Medical History:  Diagnosis Date  . Breast cancer ([place])   . Cancer ([place])    left breast  . DVT (deep venous thrombosis) ([place])    2019  . Hypertension   . Port catheter in place 09/14/2016   Placed 09/10/2016 RT chest.     Past Surgical History:  Procedure Laterality Date  . ABDOMINAL HYSTERECTOMY    . APPENDECTOMY    . BREAST BIOPSY    . CYST EXCISION Right 09/2016   Buttocks  . IVC FILTER REMOVAL N/A 01/17/2018   Procedure: IVC FILTER REMOVAL;  Surgeon: Algernon Huxley, MD;  Location: [place] [facility];  Service: Cardiovascular;  Laterality: N/A;  . LOWER EXTREMITY ANGIOGRAPHY Right 11/11/2017   Procedure: LOWER EXTREMITY ANGIOGRAPHY;  Surgeon: Katha Cabal, MD;  Location: [place]  CV LAB;  Service: Cardiovascular;  Laterality: Right;  . PERIPHERAL VASCULAR THROMBECTOMY Right 08/30/2017   Procedure: PERIPHERAL VASCULAR THROMBECTOMY;  Surgeon: Algernon Huxley, MD;  Location: [place] [facility];  Service: Cardiovascular;  Laterality: Right;  . PORTACATH PLACEMENT Right 09/10/2016   Procedure: INSERTION PORT-A-CATH;  Surgeon: Jules Husbands, MD;  Location: [facility] ORS;  Service: General;  Laterality: Right;    Social History   Socioeconomic History  . Marital status: Married    Spouse name: Jeneen Rinks  . Number of children: 2  . Years of education: 6  . Highest education level: Bachelor's degree (e.g., BA, AB, BS)  Occupational History  . 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[employer]  Social Needs  . Financial resource strain: 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  . Food insecurity:    Worry: Never true    Inability: Never true  . Transportation needs:    Medical: No    Non-medical: No  Tobacco Use  . Smoking status: Never Smoker  . Smokeless tobacco: Never Used  Substance and Sexual Activity  . Alcohol use: No  . Drug use: No  . Sexual activity: Never  Lifestyle  . Physical activity:    Days per week: Patient refused    Minutes per session: Patient refused  . Stress: Not at all  Relationships  . Social connections:    Talks on phone: More than three times a week    Gets together: Once a week    Attends religious service: More than 4 times per year    Active member of club or organization: No  Attends meetings of clubs or organizations: Never    Relationship status: Not on file  . Intimate partner violence:    Fear of current or ex partner: Not on file    Emotionally abused: Not on file    Physically abused: Not on file    Forced sexual activity: Not on file  Other Topics Concern  . Not on file  Social History Narrative  . Not on file    Family History  Problem Relation Age of Onset  . Parkinson's disease Mother   . Bladder Cancer Father   . Lung  cancer Father      Current Outpatient Medications:  .  acetaminophen (TYLENOL) 500 MG tablet, Take 500 mg by mouth daily as needed for moderate pain or headache. , Disp: , Rfl:  .  amLODipine (NORVASC) 10 MG tablet, Take 10 mg by mouth daily., Disp: , Rfl:  .  Calcium Carb-Cholecalciferol (CALCIUM 600/VITAMIN D3 PO), Take 1 tablet by mouth daily., Disp: , Rfl:  .  hydrochlorothiazide (MICROZIDE) 12.5 MG capsule, Take 12.5 mg by mouth daily. , Disp: , Rfl:  .  IBRANCE 75 MG capsule, TAKE 1 CAPSULE (75 MG TOTAL) BY MOUTH DAILY WITH BREAKFAST. TAKE FOR 21 DAYS ON, THEN 7 DAYS OFF., Disp: 21 capsule, Rfl: 3 .  letrozole (FEMARA) 2.5 MG tablet, Take 1 tablet (2.5 mg total) by mouth daily., Disp: 90 tablet, Rfl: 3 .  lidocaine-prilocaine (EMLA) cream, Apply 1 application topically as needed (port access)., Disp: 30 g, Rfl: 2 .  PREVIDENT 5000 BOOSTER PLUS 1.1 % PSTE, Place 1 application onto teeth 2 (two) times daily. , Disp: , Rfl:  .  Vitamin D, Cholecalciferol, 400 units CAPS, Take 400 Units by mouth daily. , Disp: , Rfl:  .  apixaban (ELIQUIS) 5 MG TABS tablet, Take 1 tablet (5 mg total) by mouth 2 (two) times daily. (Patient not taking: Reported on 05/05/2018), Disp: 60 tablet, Rfl: 2 .  aspirin EC 81 MG tablet, Take 1 tablet (81 mg total) by mouth daily. (Patient not taking: Reported on 05/05/2018), Disp: 150 tablet, Rfl: 2 .  oxyCODONE-acetaminophen (PERCOCET/ROXICET) 5-325 MG tablet, Take 1 tablet by mouth every 6 (six) hours as needed for moderate pain or severe pain. (Patient not taking: Reported on 05/05/2018), Disp: 28 tablet, Rfl: 0 .  prochlorperazine (COMPAZINE) 10 MG tablet, Take 1 tablet (10 mg total) by mouth every 6 (six) hours as needed (Nausea or vomiting). (Patient not taking: Reported on 11/25/2017), Disp: 30 tablet, Rfl: 1  Physical exam:  Vitals:   05/05/18 0827  BP: 131/76  Pulse: (!) 56  Resp: 18  Temp: 98.2 F (36.8 C)  TempSrc: Oral  Weight: 204 lb 3.2 oz (92.6 kg)    Physical Exam HENT:     Head: Normocephalic and atraumatic.  Eyes:     Pupils: Pupils are equal, round, and reactive to light.  Neck:     Musculoskeletal: Normal range of motion.  Cardiovascular:     Rate and Rhythm: Normal rate and regular rhythm.     Heart sounds: Normal heart sounds.  Pulmonary:     Effort: Pulmonary effort is normal.     Breath sounds: Normal breath sounds.  Abdominal:     General: Bowel sounds are normal.     Palpations: Abdomen is soft.  Skin:    General: Skin is warm and dry.  Neurological:     Mental Status: She is alert and oriented to person, place, and time.  There is some residual skin thickening over the left breast but no distinct palpable mass  CMP Latest Ref Rng & Units 05/05/2018  Glucose 70 - 99 mg/dL 128(H)  BUN 8 - 23 mg/dL 12  Creatinine 0.44 - 1.00 mg/dL 0.83  Sodium 135 - 145 mmol/L 140  Potassium 3.5 - 5.1 mmol/L 3.7  Chloride 98 - 111 mmol/L 106  CO2 22 - 32 mmol/L 26  Calcium 8.9 - 10.3 mg/dL 9.6  Total Protein 6.5 - 8.1 g/dL 7.2  Total Bilirubin 0.3 - 1.2 mg/dL 1.0  Alkaline Phos 38 - 126 U/L 55  AST 15 - 41 U/L 26  ALT 0 - 44 U/L 25   CBC Latest Ref Rng & Units 05/05/2018  WBC 4.0 - 10.5 K/uL 2.3(L)  Hemoglobin 12.0 - 15.0 g/dL 12.7  Hematocrit 36.0 - 46.0 % 36.5  Platelets 150 - 400 K/uL 206     Assessment and plan- Patient is a 78 y.o. female with metastatic breast cancer ER positive with bone only metastases currently on letrozole and Ibrance.  She is here for routine follow-up of her breast cancer  Overall patient is tolerating her Ibrance well without any significant side effects other than mild fatigue.  She has mild leukopenia today but her ANC is greater than 1 and she will continue Ibrance at 75 mg along with letrozole.  She will be due for repeat scans in March 2020.  Patient also has bone only metastases and has been getting Xgeva every 3 months now and will be due for her next dose of Xgeva roughly on  05/18/2018.  I will see her back in 1 month's time with CBC CMP and CA-27-29   Visit Diagnosis 1. High risk medication use   2. Bone metastases ([place])   3. Malignant neoplasm of left breast in female, estrogen receptor positive, unspecified site of breast [facility])      Dr. Randa Evens, MD, MPH [facility] at [facility] [phone] 05/05/2018 8:44 AM

## 2018-05-05 NOTE — Progress Notes (Signed)
Off Ibrance this week. Here for labs prior to tooth extraction. Feeling well. No complaints. Taking femara does have hot flashes/night sweats.

## 2018-05-06 ENCOUNTER — Ambulatory Visit (INDEPENDENT_AMBULATORY_CARE_PROVIDER_SITE_OTHER): Payer: Medicare Other | Admitting: Vascular Surgery

## 2018-05-06 ENCOUNTER — Encounter

## 2018-05-06 ENCOUNTER — Ambulatory Visit (INDEPENDENT_AMBULATORY_CARE_PROVIDER_SITE_OTHER): Payer: Medicare Other

## 2018-05-06 ENCOUNTER — Encounter (INDEPENDENT_AMBULATORY_CARE_PROVIDER_SITE_OTHER): Payer: Self-pay | Admitting: Vascular Surgery

## 2018-05-06 VITALS — BP 152/56 | HR 60 | Resp 16 | Ht 62.0 in | Wt 203.4 lb

## 2018-05-06 DIAGNOSIS — I82411 Acute embolism and thrombosis of right femoral vein: Secondary | ICD-10-CM | POA: Diagnosis not present

## 2018-05-06 DIAGNOSIS — I82511 Chronic embolism and thrombosis of right femoral vein: Secondary | ICD-10-CM

## 2018-05-06 DIAGNOSIS — I10 Essential (primary) hypertension: Secondary | ICD-10-CM

## 2018-05-06 DIAGNOSIS — C50912 Malignant neoplasm of unspecified site of left female breast: Secondary | ICD-10-CM | POA: Diagnosis not present

## 2018-05-06 DIAGNOSIS — Z95828 Presence of other vascular implants and grafts: Secondary | ICD-10-CM

## 2018-05-06 DIAGNOSIS — Z170 Estrogen receptor positive status [ER+]: Secondary | ICD-10-CM

## 2018-05-06 DIAGNOSIS — I739 Peripheral vascular disease, unspecified: Secondary | ICD-10-CM

## 2018-05-06 LAB — CANCER ANTIGEN 27.29: CA 27.29: 81 U/mL — ABNORMAL HIGH (ref 0.0–38.6)

## 2018-05-06 NOTE — Assessment & Plan Note (Signed)
Her DVT study today demonstrates resolution of her right lower extremity DVT.  She should wear compression stockings and elevate her legs to avoid postphlebitic symptoms.

## 2018-05-06 NOTE — Progress Notes (Signed)
MRN : [id number]  Victoria Steele is a 78 y.o. (1940-08-17) female who presents with chief complaint of  Chief Complaint  Patient presents with  . Follow-up  .  History of Present Illness: Patient returns today in follow up of her DVT.  She is having very mild right leg swelling but nothing of significance.  She remains on Eliquis.  Her DVT study today demonstrates resolution of her right lower extremity DVT.  She has already had her IVC filter removed.  Her access site from that is well-healed. She has also previously been treated for PAD.  At current, she has minimal claudication symptoms.  No rest pain or ulceration.  She is scheduled to have ABIs checked in a little over a month.  Current Outpatient Medications  Medication Sig Dispense Refill  . acetaminophen (TYLENOL) 500 MG tablet Take 500 mg by mouth daily as needed for moderate pain or headache.     Marland Kitchen amLODipine (NORVASC) 10 MG tablet Take 10 mg by mouth daily.    Marland Kitchen apixaban (ELIQUIS) 5 MG TABS tablet Take 1 tablet (5 mg total) by mouth 2 (two) times daily. 60 tablet 2  . aspirin EC 81 MG tablet Take 1 tablet (81 mg total) by mouth daily. 150 tablet 2  . Calcium Carb-Cholecalciferol (CALCIUM 600/VITAMIN D3 PO) Take 1 tablet by mouth daily.    . hydrochlorothiazide (MICROZIDE) 12.5 MG capsule Take 12.5 mg by mouth daily.     Leslee Home 75 MG capsule TAKE 1 CAPSULE (75 MG TOTAL) BY MOUTH DAILY WITH BREAKFAST. TAKE FOR 21 DAYS ON, THEN 7 DAYS OFF. 21 capsule 3  . letrozole (FEMARA) 2.5 MG tablet Take 1 tablet (2.5 mg total) by mouth daily. 90 tablet 3  . lidocaine-prilocaine (EMLA) cream Apply 1 application topically as needed (port access). 30 g 2  . oxyCODONE-acetaminophen (PERCOCET/ROXICET) 5-325 MG tablet Take 1 tablet by mouth every 6 (six) hours as needed for moderate pain or severe pain. 28 tablet 0  . PREVIDENT 5000 BOOSTER PLUS 1.1 % PSTE Place 1 application onto teeth 2 (two) times daily.     . prochlorperazine (COMPAZINE) 10  MG tablet Take 1 tablet (10 mg total) by mouth every 6 (six) hours as needed (Nausea or vomiting). 30 tablet 1  . Vitamin D, Cholecalciferol, 400 units CAPS Take 400 Units by mouth daily.      No current facility-administered medications for this visit.     Past Medical History:  Diagnosis Date  . Breast cancer ([place])   . Cancer ([place])    left breast  . DVT (deep venous thrombosis) ([place])    2019  . Hypertension   . Port catheter in place 09/14/2016   Placed 09/10/2016 RT chest.    Past Surgical History:  Procedure Laterality Date  . ABDOMINAL HYSTERECTOMY    . APPENDECTOMY    . BREAST BIOPSY    . CYST EXCISION Right 09/2016   Buttocks  . IVC FILTER REMOVAL N/A 01/17/2018   Procedure: IVC FILTER REMOVAL;  Surgeon: Algernon Huxley, MD;  Location: [facility];  Service: Cardiovascular;  Laterality: N/A;  . LOWER EXTREMITY ANGIOGRAPHY Right 11/11/2017   Procedure: LOWER EXTREMITY ANGIOGRAPHY;  Surgeon: Katha Cabal, MD;  Location: [facility];  Service: Cardiovascular;  Laterality: Right;  . PERIPHERAL VASCULAR THROMBECTOMY Right 08/30/2017   Procedure: PERIPHERAL VASCULAR THROMBECTOMY;  Surgeon: Algernon Huxley, MD;  Location: [facility];  Service: Cardiovascular;  Laterality: Right;  .  PORTACATH PLACEMENT Right 09/10/2016   Procedure: INSERTION PORT-A-CATH;  Surgeon: Jules Husbands, MD;  Location: [facility];  Service: General;  Laterality: Right;    Social History       Tobacco Use  . Smoking status: Never Smoker  . Smokeless tobacco: Never Used  Substance Use Topics  . Alcohol use: No  . Drug use: No     Family History      Family History  Problem Relation Age of Onset  . Parkinson's disease Mother   . Bladder Cancer Father   . Lung cancer Father     No Known Allergies   REVIEW OF SYSTEMS(Negative unless checked)  Constitutional: '[]'$ ?Weight loss'[]'$ ?Fever'[]'$ ?Chills Cardiac:'[]'$ ?Chest pain'[]'$ ?Chest pressure'[]'$ ?Palpitations  '[]'$ ?Shortness of breath when laying flat '[]'$ ?Shortness of breath at rest '[]'$ ?Shortness of breath with exertion. Vascular: '[]'$ ?Pain in legs with walking'[]'$ ?Pain in legsat rest'[]'$ ?Pain in legs when laying flat '[]'$ ?Claudication '[]'$ ?Pain in feet when walking '[]'$ ?Pain in feet at rest '[]'$ ?Pain in feet when laying flat '[x]'$ ?History of DVT '[x]'$ ?Phlebitis '[x]'$ ?Swelling in legs '[]'$ ?Varicose veins '[]'$ ?Non-healing ulcers Pulmonary: '[]'$ ?Uses home oxygen '[]'$ ?Productive cough'[]'$ ?Hemoptysis '[]'$ ?Wheeze '[]'$ ?COPD '[]'$ ?Asthma Neurologic: '[]'$ ?Dizziness '[]'$ ?Blackouts '[]'$ ?Seizures '[]'$ ?History of stroke '[]'$ ?History of TIA'[]'$ ?Aphasia '[]'$ ?Temporary blindness'[]'$ ?Dysphagia '[]'$ ?Weaknessor numbness in arms '[]'$ ?Weakness or numbnessin legs Musculoskeletal: '[x]'$ ?Arthritis '[]'$ ?Joint swelling '[]'$ ?Joint pain '[]'$ ?Low back pain Hematologic:'[]'$ ?Easy bruising'[]'$ ?Easy bleeding '[]'$ ?Hypercoagulable state '[]'$ ?Anemic '[]'$ ?Hepatitis Gastrointestinal:'[]'$ ?Blood in stool'[]'$ ?Vomiting blood'[]'$ ?Gastroesophageal reflux/heartburn'[]'$ ?Abdominal pain Genitourinary: '[]'$ ?Chronic kidney disease '[]'$ ?Difficulturination '[]'$ ?Frequenturination '[]'$ ?Burning with urination'[]'$ ?Hematuria Skin: '[]'$ ?Rashes '[]'$ ?Ulcers '[]'$ ?Wounds Psychological: '[]'$ ?History of anxiety'[]'$ ?History of major depression.    Physical Examination  BP (!) 152/56 (BP Location: Right Arm, Patient Position: Sitting, Cuff Size: Large)   Pulse 60   Resp 16   Ht '5\' 2"'$  (1.575 m)   Wt 203 lb 6.4 oz (92.3 kg)   BMI 37.20 kg/m  Gen:  WD/WN, NAD Head: [place]/AT, No temporalis wasting. Ear/Nose/Throat: Hearing grossly intact, nares w/o erythema or drainage Eyes: Conjunctiva clear. Sclera non-icteric Neck: Supple.  Trachea midline Pulmonary:  Good air movement, no use of accessory muscles.  Cardiac: RRR, no JVD Vascular:  Vessel Right Left  Radial Palpable Palpable                          PT 1+ Palpable 1+ Palpable  DP Palpable Palpable      Musculoskeletal: M/S 5/5 throughout.  No deformity or atrophy. Mild BLE edema. Neurologic: Sensation grossly intact in extremities.  Symmetrical.  Speech is fluent.  Psychiatric: Judgment intact, Mood & affect appropriate for pt's clinical situation. Dermatologic: No rashes or ulcers noted.  No cellulitis or open wounds.       Labs Recent Results (from the past 2160 hour(s))  Comprehensive metabolic panel     Status: Abnormal   Collection Time: 02/15/18  2:23 PM  Result Value Ref Range   Sodium 139 135 - 145 mmol/L   Potassium 3.8 3.5 - 5.1 mmol/L   Chloride 107 98 - 111 mmol/L   CO2 24 22 - 32 mmol/L   Glucose, Bld 129 (H) 70 - 99 mg/dL   BUN 13 8 - 23 mg/dL   Creatinine, Ser 0.80 0.44 - 1.00 mg/dL   Calcium 9.6 8.9 - 10.3 mg/dL   Total Protein 6.9 6.5 - 8.1 g/dL   Albumin 4.1 3.5 - 5.0 g/dL   AST 33 15 - 41 U/L   ALT 33 0 - 44 U/L   Alkaline Phosphatase 53 38 - 126 U/L   Total Bilirubin 0.8 0.3 - 1.2 mg/dL   GFR calc non Af  Amer >60 >60 mL/min   GFR calc Af Amer >60 >60 mL/min    Comment: (NOTE) The eGFR has been calculated using the CKD EPI equation. This calculation has not been validated in all clinical situations. eGFR's persistently <60 mL/min signify possible Chronic Kidney Disease.    Anion gap 8 5 - 15    Comment: Performed at [facility], [address] [place], [place] [address]  CBC with Differential/Platelet     Status: Abnormal   Collection Time: 02/15/18  2:23 PM  Result Value Ref Range   WBC 3.0 (L) 4.0 - 10.5 K/uL   RBC 3.13 (L) 3.87 - 5.11 MIL/uL   Hemoglobin 11.4 (L) 12.0 - 15.0 g/dL   HCT 32.5 (L) 36.0 - 46.0 %   MCV 103.8 (H) 80.0 - 100.0 fL   MCH 36.4 (H) 26.0 - 34.0 pg   MCHC 35.1 30.0 - 36.0 g/dL   RDW 14.9 11.5 - 15.5 %   Platelets 219 150 - 400 K/uL   nRBC 0.0 0.0 - 0.2 %   Neutrophils Relative % 49 %   Neutro Abs 1.5 (L) 1.7 - 7.7 K/uL   Lymphocytes Relative 37 %   Lymphs Abs 1.1 0.7 - 4.0 K/uL   Monocytes Relative 8 %    Monocytes Absolute 0.3 0.1 - 1.0 K/uL   Eosinophils Relative 3 %   Eosinophils Absolute 0.1 0.0 - 0.5 K/uL   Basophils Relative 2 %   Basophils Absolute 0.1 0.0 - 0.1 K/uL   Immature Granulocytes 1 %   Abs Immature Granulocytes 0.02 0.00 - 0.07 K/uL    Comment: Performed at [facility], [address] [place], [place] [address]  Cancer antigen 27.29     Status: Abnormal   Collection Time: 03/24/18  1:07 PM  Result Value Ref Range   CA 27.29 79.8 (H) 0.0 - 38.6 U/mL    Comment: (NOTE) Siemens Centaur Immunochemiluminometric Methodology (ICMA) Values obtained with different assay methods or kits cannot be used interchangeably. Results cannot be interpreted as absolute evidence of the presence or absence of malignant disease. Performed At: [facility] [place], [place] [id number] Rush Farmer MD [id number]   Comprehensive metabolic panel     Status: Abnormal   Collection Time: 03/24/18  1:07 PM  Result Value Ref Range   Sodium 138 135 - 145 mmol/L   Potassium 3.8 3.5 - 5.1 mmol/L   Chloride 106 98 - 111 mmol/L   CO2 25 22 - 32 mmol/L   Glucose, Bld 122 (H) 70 - 99 mg/dL   BUN 11 8 - 23 mg/dL   Creatinine, Ser 0.93 0.44 - 1.00 mg/dL   Calcium 9.5 8.9 - 10.3 mg/dL   Total Protein 6.9 6.5 - 8.1 g/dL   Albumin 4.1 3.5 - 5.0 g/dL   AST 29 15 - 41 U/L   ALT 29 0 - 44 U/L   Alkaline Phosphatase 52 38 - 126 U/L   Total Bilirubin 1.0 0.3 - 1.2 mg/dL   GFR calc non Af Amer >60 >60 mL/min   GFR calc Af Amer >60 >60 mL/min   Anion gap 7 5 - 15    Comment: Performed at [facility], [address] [place], [place] [address]  CBC with Differential/Platelet     Status: Abnormal   Collection Time: 03/24/18  1:07 PM  Result Value Ref Range   WBC 2.4 (L) 4.0 - 10.5 K/uL   RBC 3.32 (L) 3.87 -  5.11 MIL/uL   Hemoglobin 12.0 12.0 - 15.0 g/dL   HCT 34.3 (L) 36.0 - 46.0 %   MCV 103.3 (H) 80.0 - 100.0 fL   MCH 36.1 (H) 26.0 - 34.0 pg   MCHC 35.0  30.0 - 36.0 g/dL   RDW 14.2 11.5 - 15.5 %   Platelets 290 150 - 400 K/uL   nRBC 0.0 0.0 - 0.2 %   Neutrophils Relative % 49 %   Neutro Abs 1.2 (L) 1.7 - 7.7 K/uL   Lymphocytes Relative 39 %   Lymphs Abs 1.0 0.7 - 4.0 K/uL   Monocytes Relative 7 %   Monocytes Absolute 0.2 0.1 - 1.0 K/uL   Eosinophils Relative 3 %   Eosinophils Absolute 0.1 0.0 - 0.5 K/uL   Basophils Relative 2 %   Basophils Absolute 0.1 0.0 - 0.1 K/uL   Immature Granulocytes 0 %   Abs Immature Granulocytes 0.00 0.00 - 0.07 K/uL    Comment: Performed at [facility], [address] [place], [place] [address]  Protime-INR     Status: None   Collection Time: 05/05/18  7:57 AM  Result Value Ref Range   Prothrombin Time 13.5 11.4 - 15.2 seconds   INR 1.04     Comment: Performed at [facility], [address] [place], [place] [address]  Cancer antigen 27.29     Status: Abnormal   Collection Time: 05/05/18  7:57 AM  Result Value Ref Range   CA 27.29 81.0 (H) 0.0 - 38.6 U/mL    Comment: (NOTE) Siemens Centaur Immunochemiluminometric Methodology (ICMA) Values obtained with different assay methods or kits cannot be used interchangeably. Results cannot be interpreted as absolute evidence of the presence or absence of malignant disease. Performed At: [facility] [place], [place] [id number] Rush Farmer MD [id number]   Comprehensive metabolic panel     Status: Abnormal   Collection Time: 05/05/18  7:57 AM  Result Value Ref Range   Sodium 140 135 - 145 mmol/L   Potassium 3.7 3.5 - 5.1 mmol/L   Chloride 106 98 - 111 mmol/L   CO2 26 22 - 32 mmol/L   Glucose, Bld 128 (H) 70 - 99 mg/dL   BUN 12 8 - 23 mg/dL   Creatinine, Ser 0.83 0.44 - 1.00 mg/dL   Calcium 9.6 8.9 - 10.3 mg/dL   Total Protein 7.2 6.5 - 8.1 g/dL   Albumin 4.3 3.5 - 5.0 g/dL   AST 26 15 - 41 U/L   ALT 25 0 - 44 U/L   Alkaline Phosphatase 55 38 - 126 U/L   Total Bilirubin 1.0 0.3 - 1.2 mg/dL   GFR calc  non Af Amer >60 >60 mL/min   GFR calc Af Amer >60 >60 mL/min   Anion gap 8 5 - 15    Comment: Performed at [facility], [address] [place], [place] [address]  CBC with Differential/Platelet     Status: Abnormal   Collection Time: 05/05/18  7:57 AM  Result Value Ref Range   WBC 2.3 (L) 4.0 - 10.5 K/uL   RBC 3.53 (L) 3.87 - 5.11 MIL/uL   Hemoglobin 12.7 12.0 - 15.0 g/dL   HCT 36.5 36.0 - 46.0 %   MCV 103.4 (H) 80.0 - 100.0 fL   MCH 36.0 (H) 26.0 - 34.0 pg   MCHC 34.8 30.0 - 36.0 g/dL   RDW 14.6 11.5 - 15.5 %   Platelets 206 150 - 400 K/uL  nRBC 0.0 0.0 - 0.2 %   Neutrophils Relative % 48 %   Neutro Abs 1.1 (L) 1.7 - 7.7 K/uL   Lymphocytes Relative 36 %   Lymphs Abs 0.8 0.7 - 4.0 K/uL   Monocytes Relative 10 %   Monocytes Absolute 0.2 0.1 - 1.0 K/uL   Eosinophils Relative 3 %   Eosinophils Absolute 0.1 0.0 - 0.5 K/uL   Basophils Relative 2 %   Basophils Absolute 0.1 0.0 - 0.1 K/uL   Immature Granulocytes 1 %   Abs Immature Granulocytes 0.02 0.00 - 0.07 K/uL    Comment: Performed at [facility], [address], [place], [place] [address]    Radiology No results found.  Assessment/Plan Breast cancer in female [facility]) This increases her clotting risk and is likely a cause of her being hypercoagulable.  Hypertension blood pressure control important in reducing the progression of atherosclerotic disease. On appropriate oral medications.   Port catheter in place Placed last year by her Education officer, environmental and working well.  DVT (deep venous thrombosis) ([facility]) Her DVT study today demonstrates resolution of her right lower extremity DVT.  She should wear compression stockings and elevate her legs to avoid postphlebitic symptoms.  PAD (peripheral artery disease) ([place]) To be checked in early March.  No worrisome symptoms currently.    Leotis Pain, MD  05/06/2018 11:58 AM    This note was created with Dragon medical transcription system.  Any errors from  dictation are purely unintentional

## 2018-05-06 NOTE — Assessment & Plan Note (Signed)
To be checked in early March.  No worrisome symptoms currently.

## 2018-05-18 ENCOUNTER — Ambulatory Visit: Payer: Medicare Other

## 2018-05-18 ENCOUNTER — Inpatient Hospital Stay: Payer: Medicare Other

## 2018-05-18 ENCOUNTER — Inpatient Hospital Stay: Payer: Medicare Other | Attending: Oncology

## 2018-05-18 ENCOUNTER — Other Ambulatory Visit: Payer: Medicare Other

## 2018-05-18 VITALS — BP 161/71 | HR 75 | Temp 97.2°F | Resp 16

## 2018-05-18 DIAGNOSIS — I10 Essential (primary) hypertension: Secondary | ICD-10-CM | POA: Insufficient documentation

## 2018-05-18 DIAGNOSIS — Z79899 Other long term (current) drug therapy: Secondary | ICD-10-CM

## 2018-05-18 DIAGNOSIS — Z170 Estrogen receptor positive status [ER+]: Secondary | ICD-10-CM | POA: Diagnosis not present

## 2018-05-18 DIAGNOSIS — C50912 Malignant neoplasm of unspecified site of left female breast: Secondary | ICD-10-CM | POA: Insufficient documentation

## 2018-05-18 DIAGNOSIS — Z7901 Long term (current) use of anticoagulants: Secondary | ICD-10-CM | POA: Diagnosis not present

## 2018-05-18 DIAGNOSIS — D72819 Decreased white blood cell count, unspecified: Secondary | ICD-10-CM | POA: Insufficient documentation

## 2018-05-18 DIAGNOSIS — H538 Other visual disturbances: Secondary | ICD-10-CM | POA: Insufficient documentation

## 2018-05-18 DIAGNOSIS — C7951 Secondary malignant neoplasm of bone: Secondary | ICD-10-CM | POA: Insufficient documentation

## 2018-05-18 DIAGNOSIS — Z86718 Personal history of other venous thrombosis and embolism: Secondary | ICD-10-CM | POA: Insufficient documentation

## 2018-05-18 LAB — CBC WITH DIFFERENTIAL/PLATELET
Abs Immature Granulocytes: 0.02 10*3/uL (ref 0.00–0.07)
Basophils Absolute: 0.1 10*3/uL (ref 0.0–0.1)
Basophils Relative: 2 %
Eosinophils Absolute: 0.1 10*3/uL (ref 0.0–0.5)
Eosinophils Relative: 4 %
HCT: 35 % — ABNORMAL LOW (ref 36.0–46.0)
Hemoglobin: 12.2 g/dL (ref 12.0–15.0)
Immature Granulocytes: 1 %
Lymphocytes Relative: 34 %
Lymphs Abs: 0.9 10*3/uL (ref 0.7–4.0)
MCH: 35.9 pg — ABNORMAL HIGH (ref 26.0–34.0)
MCHC: 34.9 g/dL (ref 30.0–36.0)
MCV: 102.9 fL — ABNORMAL HIGH (ref 80.0–100.0)
Monocytes Absolute: 0.2 10*3/uL (ref 0.1–1.0)
Monocytes Relative: 7 %
Neutro Abs: 1.4 10*3/uL — ABNORMAL LOW (ref 1.7–7.7)
Neutrophils Relative %: 52 %
PLATELETS: 323 10*3/uL (ref 150–400)
RBC: 3.4 MIL/uL — AB (ref 3.87–5.11)
RDW: 14 % (ref 11.5–15.5)
WBC: 2.6 10*3/uL — AB (ref 4.0–10.5)
nRBC: 0 % (ref 0.0–0.2)

## 2018-05-18 LAB — COMPREHENSIVE METABOLIC PANEL
ALT: 26 U/L (ref 0–44)
AST: 26 U/L (ref 15–41)
Albumin: 4.1 g/dL (ref 3.5–5.0)
Alkaline Phosphatase: 61 U/L (ref 38–126)
Anion gap: 5 (ref 5–15)
BUN: 13 mg/dL (ref 8–23)
CALCIUM: 9.3 mg/dL (ref 8.9–10.3)
CO2: 25 mmol/L (ref 22–32)
Chloride: 108 mmol/L (ref 98–111)
Creatinine, Ser: 1 mg/dL (ref 0.44–1.00)
GFR calc Af Amer: >60 mL/min (ref >60)
GFR calc non Af Amer: 55 mL/min — ABNORMAL LOW (ref >60)
Glucose, Bld: 129 mg/dL — ABNORMAL HIGH (ref 70–99)
POTASSIUM: 3.9 mmol/L (ref 3.5–5.1)
Sodium: 138 mmol/L (ref 135–145)
Total Bilirubin: 0.8 mg/dL (ref 0.3–1.2)
Total Protein: 7 g/dL (ref 6.5–8.1)

## 2018-05-18 MED ORDER — ZOLEDRONIC ACID 4 MG/100ML IV SOLN
4.0000 mg | Freq: Once | INTRAVENOUS | Status: AC
  Administered 2018-05-18: 4 mg via INTRAVENOUS
  Filled 2018-05-18: qty 100

## 2018-05-18 MED ORDER — HEPARIN SOD (PORK) LOCK FLUSH 100 UNIT/ML IV SOLN
500.0000 [IU] | Freq: Once | INTRAVENOUS | Status: AC
  Administered 2018-05-18: 500 [IU] via INTRAVENOUS
  Filled 2018-05-18: qty 5

## 2018-05-18 MED ORDER — SODIUM CHLORIDE 0.9% FLUSH
10.0000 mL | Freq: Once | INTRAVENOUS | Status: AC
  Administered 2018-05-18: 10 mL via INTRAVENOUS
  Filled 2018-05-18: qty 10

## 2018-05-18 MED ORDER — SODIUM CHLORIDE 0.9 % IV SOLN
INTRAVENOUS | Status: DC
  Administered 2018-05-18: 15:00:00 via INTRAVENOUS
  Filled 2018-05-18: qty 250

## 2018-05-18 MED ORDER — HEPARIN SOD (PORK) LOCK FLUSH 100 UNIT/ML IV SOLN: 500.0000 [IU] | Freq: Once | INTRAVENOUS | Status: DC | PRN

## 2018-05-23 ENCOUNTER — Telehealth: Payer: Self-pay | Admitting: *Deleted

## 2018-05-23 ENCOUNTER — Other Ambulatory Visit: Payer: Self-pay | Admitting: *Deleted

## 2018-05-23 MED ORDER — APIXABAN 5 MG PO TABS: 5.0000 mg | ORAL_TABLET | Freq: Two times a day (BID) | ORAL | 3 refills | Status: DC

## 2018-05-23 NOTE — Telephone Encounter (Signed)
Called and needs a refill of her Eliquis.  It was refilled and given 3 refills after her month supply.

## 2018-05-30 MED FILL — IBRANCE 75 MG CAPSULE: 75 | 28 days supply | Qty: 21 | Fill #1

## 2018-06-06 ENCOUNTER — Inpatient Hospital Stay (HOSPITAL_BASED_OUTPATIENT_CLINIC_OR_DEPARTMENT_OTHER): Payer: Medicare Other | Admitting: Oncology

## 2018-06-06 ENCOUNTER — Encounter: Payer: Self-pay | Admitting: Oncology

## 2018-06-06 ENCOUNTER — Other Ambulatory Visit: Payer: Self-pay

## 2018-06-06 ENCOUNTER — Inpatient Hospital Stay: Payer: Medicare Other

## 2018-06-06 VITALS — BP 188/78 | HR 75 | Temp 97.5°F | Resp 18 | Wt 205.8 lb

## 2018-06-06 DIAGNOSIS — H538 Other visual disturbances: Secondary | ICD-10-CM

## 2018-06-06 DIAGNOSIS — C50912 Malignant neoplasm of unspecified site of left female breast: Secondary | ICD-10-CM | POA: Diagnosis not present

## 2018-06-06 DIAGNOSIS — Z170 Estrogen receptor positive status [ER+]: Secondary | ICD-10-CM

## 2018-06-06 DIAGNOSIS — I10 Essential (primary) hypertension: Secondary | ICD-10-CM

## 2018-06-06 DIAGNOSIS — D72819 Decreased white blood cell count, unspecified: Secondary | ICD-10-CM

## 2018-06-06 DIAGNOSIS — Z7901 Long term (current) use of anticoagulants: Secondary | ICD-10-CM

## 2018-06-06 DIAGNOSIS — Z79899 Other long term (current) drug therapy: Secondary | ICD-10-CM

## 2018-06-06 DIAGNOSIS — C7951 Secondary malignant neoplasm of bone: Secondary | ICD-10-CM | POA: Diagnosis not present

## 2018-06-06 DIAGNOSIS — Z86718 Personal history of other venous thrombosis and embolism: Secondary | ICD-10-CM

## 2018-06-06 LAB — CBC WITH DIFFERENTIAL/PLATELET
ABS IMMATURE GRANULOCYTES: 0.02 10*3/uL (ref 0.00–0.07)
BASOS PCT: 3 %
Basophils Absolute: 0.1 10*3/uL (ref 0.0–0.1)
Eosinophils Absolute: 0.1 10*3/uL (ref 0.0–0.5)
Eosinophils Relative: 3 %
HCT: 36.6 % (ref 36.0–46.0)
Hemoglobin: 12.7 g/dL (ref 12.0–15.0)
Immature Granulocytes: 1 %
Lymphocytes Relative: 36 %
Lymphs Abs: 1.2 10*3/uL (ref 0.7–4.0)
MCH: 36.2 pg — AB (ref 26.0–34.0)
MCHC: 34.7 g/dL (ref 30.0–36.0)
MCV: 104.3 fL — ABNORMAL HIGH (ref 80.0–100.0)
MONO ABS: 0.4 10*3/uL (ref 0.1–1.0)
Monocytes Relative: 11 %
Neutro Abs: 1.5 10*3/uL — ABNORMAL LOW (ref 1.7–7.7)
Neutrophils Relative %: 46 %
Platelets: 220 10*3/uL (ref 150–400)
RBC: 3.51 MIL/uL — AB (ref 3.87–5.11)
RDW: 14.8 % (ref 11.5–15.5)
WBC: 3.3 10*3/uL — ABNORMAL LOW (ref 4.0–10.5)
nRBC: 0 % (ref 0.0–0.2)

## 2018-06-06 LAB — COMPREHENSIVE METABOLIC PANEL
ALT: 36 U/L (ref 0–44)
ANION GAP: 9 (ref 5–15)
AST: 33 U/L (ref 15–41)
Albumin: 4.3 g/dL (ref 3.5–5.0)
Alkaline Phosphatase: 60 U/L (ref 38–126)
BUN: 14 mg/dL (ref 8–23)
CO2: 24 mmol/L (ref 22–32)
Calcium: 9.8 mg/dL (ref 8.9–10.3)
Chloride: 105 mmol/L (ref 98–111)
Creatinine, Ser: 0.87 mg/dL (ref 0.44–1.00)
GFR calc Af Amer: >60 mL/min (ref >60)
GFR calc non Af Amer: >60 mL/min (ref >60)
GLUCOSE: 117 mg/dL — AB (ref 70–99)
Potassium: 4.1 mmol/L (ref 3.5–5.1)
Sodium: 138 mmol/L (ref 135–145)
Total Bilirubin: 1 mg/dL (ref 0.3–1.2)
Total Protein: 7.3 g/dL (ref 6.5–8.1)

## 2018-06-06 NOTE — Progress Notes (Signed)
Here for follow up.  Per pt " I feel pretty good " stated she has small "bump "on her R back of her head -want Dr Janese Banks to look at .  Stated pcp Psychiatrist

## 2018-06-06 NOTE — Progress Notes (Signed)
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 Patient is a 78 year old female with no significant comorbidities who noticed left breast mass about 6-9 months ago. She thought it would go awaybut it continued to increase in size and began to involve her skin. She also noted tenderness to palpation as well as intermittent sharp tingling pain. She was seen by Dr. Adora Fridge on 08/25/2016 and patient had a biopsy of her skin in his office which showed:DIAGNOSIS:  A. LEFT BREAST SKIN; EXCISION:  - INVASIVE CARCINOMA MORPHOLOGICALLY CONSISTENT WITH MAMMARY ORIGIN  INVOLVING THE DERMIS.   BREAST BIOMARKER TESTS  Estrogen Receptor (ER) Status: POSITIVE, >90%  Progesterone Receptor (PgR) Status: POSITIVE, >70%  Her2 negative  2. Patient underwent bilateral diagnostic mammogram on 08/31/2016 showed:IMPRESSION: 1. Known left breast cancer, retroareolar with probable extension to the nipple, measuring 4.3 cm greatest dimension by ultrasound, with associated architectural distortion and associated left nipple retraction, and with associated diffuse skin thickening on the left. 2. No enlarged or morphologically abnormal lymph nodes are seen in the left axilla by ultrasound. 3. No evidence of malignancy within the right breast.  3. Patient was also complaining of some perirectal pain and underwent CT abdomen pelvis with contrast on 09/02/2016 which showed: IMPRESSION: Left colonic diverticulosis. No active diverticulitis.Fatty liver. No acute findings or evidence of metastatic  disease in the abdomen or pelvis. Diffuse bony sclerotic metastases.  4. Other than that her breast pain patient overall feels well. Denies any unintentional weight loss. She continues to be active and care for her great-grandchildren. She has had a hysterectomy in the past. No family history of breast or ovarian cancer.  5. Given that she had inflammatory breast cancer- plan was to give 4 cycles of dose dense AC followed by possible mastectomy and treat bone mets with AI + ibrance  6. PET CT scan on 09/11/16 showed: IMPRESSION: 1. Low-grade but abnormal hypermetabolic activity associated with the cutaneous and sub areolar glandular tissues of the left breast, compatible with malignancy, representative SUV 3.9 compared to contralateral normal side 1.4. 2. Diffuse sclerotic osseous metastatic disease with low-grade hypermetabolic activity, a representative SUV along the left sacrum 5.4. 3. Other imaging findings of potential clinical significance: Aortic Atherosclerosis (ICD10-I70.0). Descending and sigmoid colon Diverticulosis.  7. Baseline MUGA scan showed EF of 71%. Baseline CA 27.29 elevated at 563.2  8. 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  9. Letrozole and ibrance started in sept 2018. zometa monthly also started. Baseline bone density scan normal  10. ibrance held in oct 2018 after cycle 1 due to Grade 3 neutropenia and thrombocytopenia.cycle 2 restaretd on 02/04/17 at 100 mg. Patient continued to have prolongeed grade 3 neutropenia and dose lowered to 75 mg  11. Patient had extensive RLE DVT in April 2019 and is currently on eliquis. She also underwent venous thrombectomy and thrombolytic therapy and IVC placement by Dr. Lucky Cowboy.She also has peripheral vascular disease secondary to atherosclerosis and underwent stenting with vascular surgery as well   Interval history-she has noticed a pea-sized lesion in her scalp since the last 2 months.  Reports  that it has remained the same in size.  Denies any trauma.  She occasionally experiences discomfort when she tries to lay down.  Reports occasional blurry vision in her left eye.  She was seen by ophthalmologist and was not found to have any major vision issues.  She also has cataract in her left eye which is currently being observed.  ECOG PS- 1 Pain scale- 0 Opioid associated constipation- no  Review of systems- Review of Systems  Constitutional: Positive for malaise/fatigue. Negative for chills, fever and weight loss.  HENT: Negative for congestion, ear discharge and nosebleeds.   Eyes: Negative for blurred vision.  Respiratory: Negative for cough, hemoptysis, sputum production, shortness of breath and wheezing.   Cardiovascular: Negative for chest pain, palpitations, orthopnea and claudication.  Gastrointestinal: Negative for abdominal pain, blood in stool, constipation, diarrhea, heartburn, melena, nausea and vomiting.  Genitourinary: Negative for dysuria, flank pain, frequency, hematuria and urgency.  Musculoskeletal: Negative for back pain, joint pain and myalgias.  Skin: Negative for rash.  Neurological: Negative for dizziness, tingling, focal weakness, seizures, weakness and headaches.       Blurry vision  Endo/Heme/Allergies: Does not bruise/bleed easily.  Psychiatric/Behavioral: Negative for depression and suicidal ideas. The patient does not have insomnia.       No Known Allergies   Past Medical History:  Diagnosis Date  . Breast cancer ([place])   . Cancer ([place])    left breast  . DVT (deep venous thrombosis) ([place])    2019  . Hypertension   . Port catheter in place 09/14/2016   Placed 09/10/2016 RT chest.     Past Surgical History:  Procedure Laterality Date  . ABDOMINAL HYSTERECTOMY    . APPENDECTOMY    . BREAST BIOPSY    . CYST EXCISION Right 09/2016   Buttocks  . IVC FILTER REMOVAL N/A 01/17/2018   Procedure: IVC FILTER REMOVAL;  Surgeon: Algernon Huxley, MD;   Location: [place] [facility];  Service: Cardiovascular;  Laterality: N/A;  . LOWER EXTREMITY ANGIOGRAPHY Right 11/11/2017   Procedure: LOWER EXTREMITY ANGIOGRAPHY;  Surgeon: Katha Cabal, MD;  Location: [place] [facility];  Service: Cardiovascular;  Laterality: Right;  . PERIPHERAL VASCULAR THROMBECTOMY Right 08/30/2017   Procedure: PERIPHERAL VASCULAR THROMBECTOMY;  Surgeon: Algernon Huxley, MD;  Location: [place] [facility];  Service: Cardiovascular;  Laterality: Right;  . PORTACATH PLACEMENT Right 09/10/2016   Procedure: INSERTION PORT-A-CATH;  Surgeon: Jules Husbands, MD;  Location: [facility];  Service: General;  Laterality: Right;    Social History   Socioeconomic History  . Marital status: Married    Spouse name: Jeneen Rinks  . Number of children: 2  . Years of education: 6  . Highest education level: Bachelor's degree (e.g., BA, AB, BS)  Occupational History  . 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[employer]  Social Needs  . Financial resource strain: 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  . Food insecurity:    Worry: Never true    Inability: Never true  . Transportation needs:    Medical: No    Non-medical: No  Tobacco Use  . Smoking status: Never Smoker  . Smokeless tobacco: Never Used  Substance and Sexual Activity  . Alcohol use: No  . Drug use: No  . Sexual activity: Never  Lifestyle  . Physical activity:    Days per week: Patient refused    Minutes per session: Patient refused  . Stress: Not at all  Relationships  .  Social connections:    Talks on phone: More than three times a week    Gets together: Once a week    Attends religious service: More than 4 times per year    Active member of club or organization: No    Attends meetings of clubs or organizations: Never    Relationship status: Not on file  . Intimate partner violence:    Fear of current or ex partner: Not on file    Emotionally abused: Not on file    Physically abused: Not on file    Forced sexual  activity: Not on file  Other Topics Concern  . Not on file  Social History Narrative  . Not on file    Family History  Problem Relation Age of Onset  . Parkinson's disease Mother   . Bladder Cancer Father   . Lung cancer Father      Current Outpatient Medications:  .  amLODipine (NORVASC) 10 MG tablet, Take 10 mg by mouth daily., Disp: , Rfl:  .  apixaban (ELIQUIS) 5 MG TABS tablet, Take 1 tablet (5 mg total) by mouth 2 (two) times daily for 30 days., Disp: 60 tablet, Rfl: 3 .  aspirin EC 81 MG tablet, Take 1 tablet (81 mg total) by mouth daily., Disp: 150 tablet, Rfl: 2 .  Calcium Carb-Cholecalciferol (CALCIUM 600/VITAMIN D3 PO), Take 1 tablet by mouth daily., Disp: , Rfl:  .  IBRANCE 75 MG capsule, TAKE 1 CAPSULE (75 MG TOTAL) BY MOUTH DAILY WITH BREAKFAST. TAKE FOR 21 DAYS ON, THEN 7 DAYS OFF., Disp: 21 capsule, Rfl: 3 .  letrozole (FEMARA) 2.5 MG tablet, Take 1 tablet (2.5 mg total) by mouth daily., Disp: 90 tablet, Rfl: 3 .  PREVIDENT 5000 BOOSTER PLUS 1.1 % PSTE, Place 1 application onto teeth 2 (two) times daily. , Disp: , Rfl:  .  Vitamin D, Cholecalciferol, 400 units CAPS, Take 400 Units by mouth daily. , Disp: , Rfl:  .  acetaminophen (TYLENOL) 500 MG tablet, Take 500 mg by mouth daily as needed for moderate pain or headache. , Disp: , Rfl:  .  ibuprofen (ADVIL,MOTRIN) 400 MG tablet, Take 400 mg by mouth 3 (three) times daily as needed., Disp: , Rfl:  .  lidocaine-prilocaine (EMLA) cream, Apply 1 application topically as needed (port access). (Patient not taking: Reported on 06/06/2018), Disp: 30 g, Rfl: 2 .  oxyCODONE-acetaminophen (PERCOCET/ROXICET) 5-325 MG tablet, Take 1 tablet by mouth every 6 (six) hours as needed for moderate pain or severe pain. (Patient not taking: Reported on 06/06/2018), Disp: 28 tablet, Rfl: 0 .  prochlorperazine (COMPAZINE) 10 MG tablet, Take 1 tablet (10 mg total) by mouth every 6 (six) hours as needed (Nausea or vomiting). (Patient not taking:  Reported on 06/06/2018), Disp: 30 tablet, Rfl: 1  Physical exam:  Vitals:   06/06/18 1056  BP: (!) 188/78  Pulse: 75  Resp: 18  Temp: (!) 97.5 F (36.4 C)  TempSrc: Tympanic  Weight: 205 lb 12.8 oz (93.4 kg)   Physical Exam Constitutional:      General: She is not in acute distress. HENT:     Head: Normocephalic and atraumatic.  Eyes:     Pupils: Pupils are equal, round, and reactive to light.  Neck:     Musculoskeletal: Normal range of motion.  Cardiovascular:     Rate and Rhythm: Normal rate and regular rhythm.     Heart sounds: Normal heart sounds.  Pulmonary:     Effort: Pulmonary  effort is normal.     Breath sounds: Normal breath sounds.  Abdominal:     General: Bowel sounds are normal.     Palpations: Abdomen is soft.  Skin:    General: Skin is warm and dry.     Comments: Firm pea-sized subcutaneous lesion noted over her scalp  Neurological:     Mental Status: She is alert and oriented to person, place, and time.     Cranial Nerves: No cranial nerve deficit.     Sensory: No sensory deficit.     Motor: No weakness.     Gait: Gait normal.      CMP Latest Ref Rng & Units 06/06/2018  Glucose 70 - 99 mg/dL 117(H)  BUN 8 - 23 mg/dL 14  Creatinine 0.44 - 1.00 mg/dL 0.87  Sodium 135 - 145 mmol/L 138  Potassium 3.5 - 5.1 mmol/L 4.1  Chloride 98 - 111 mmol/L 105  CO2 22 - 32 mmol/L 24  Calcium 8.9 - 10.3 mg/dL 9.8  Total Protein 6.5 - 8.1 g/dL 7.3  Total Bilirubin 0.3 - 1.2 mg/dL 1.0  Alkaline Phos 38 - 126 U/L 60  AST 15 - 41 U/L 33  ALT 0 - 44 U/L 36   CBC Latest Ref Rng & Units 06/06/2018  WBC 4.0 - 10.5 K/uL 3.3(L)  Hemoglobin 12.0 - 15.0 g/dL 12.7  Hematocrit 36.0 - 46.0 % 36.6  Platelets 150 - 400 K/uL 220     Assessment and plan- Patient is a 78 y.o. female  with metastatic breast cancer ER positive with bone only metastases currently on letrozole and Ibrance.  She is here for routine follow-up of breast cancer on Ibrance  Patient has mild  leukopenia but her ANC is greater than 1.  She is just started taking her Ibrance on 06/04/2018.  She will continue taking that 2 weeks on 1 week off.  She is also continuing letrozole daily along with calcium and vitamin D.  She will return to clinic on 06/30/2018 with CBC with differential CMP and CA-27-29 for labs only.  I will see her back on 07/28/2018 with CBC with differential, CMP CA-27-29.  She will get CT chest abdomen and pelvis with contrast and a bone scan prior to her visit with me in April.  She is currently getting zometa for her bone metastases every 3 months and will be due for it in May 2020  Scalp lesion: Etiology unclear.  Continue to monitor if it gets bigger I will consider a biopsy  Mild blurry vision in her left eye.  I did offer getting an MRI of her brain to rule out any brain metastases.  Patient would like to hold off on that at this point and let us know if her symptoms worsen.  She does not have any other neurological deficits on exam   Visit Diagnosis 1. High risk medication use   2. Bone metastases ([place])   3. Malignant neoplasm of left breast in female, estrogen receptor positive, unspecified site of breast ([place])      Dr. Randa Evens, MD, MPH [facility] [phone] 06/06/2018 12:13 PM

## 2018-06-06 NOTE — Progress Notes (Signed)
Recheck of b/p 171/49

## 2018-06-21 ENCOUNTER — Ambulatory Visit (INDEPENDENT_AMBULATORY_CARE_PROVIDER_SITE_OTHER): Payer: Medicare Other

## 2018-06-21 ENCOUNTER — Ambulatory Visit (INDEPENDENT_AMBULATORY_CARE_PROVIDER_SITE_OTHER): Payer: Medicare Other | Admitting: Vascular Surgery

## 2018-06-21 ENCOUNTER — Encounter (INDEPENDENT_AMBULATORY_CARE_PROVIDER_SITE_OTHER): Payer: Self-pay | Admitting: Vascular Surgery

## 2018-06-21 ENCOUNTER — Other Ambulatory Visit: Payer: Self-pay

## 2018-06-21 VITALS — BP 159/70 | HR 53 | Resp 16 | Ht 62.0 in | Wt 204.0 lb

## 2018-06-21 DIAGNOSIS — C50912 Malignant neoplasm of unspecified site of left female breast: Secondary | ICD-10-CM

## 2018-06-21 DIAGNOSIS — I739 Peripheral vascular disease, unspecified: Secondary | ICD-10-CM | POA: Diagnosis not present

## 2018-06-21 DIAGNOSIS — Z79899 Other long term (current) drug therapy: Secondary | ICD-10-CM

## 2018-06-21 DIAGNOSIS — I10 Essential (primary) hypertension: Secondary | ICD-10-CM

## 2018-06-21 DIAGNOSIS — Z170 Estrogen receptor positive status [ER+]: Secondary | ICD-10-CM

## 2018-06-21 DIAGNOSIS — Z95828 Presence of other vascular implants and grafts: Secondary | ICD-10-CM

## 2018-06-21 NOTE — Progress Notes (Signed)
MRN : [id number]  Victoria Steele is a 78 y.o. (1940-06-30) female who presents with chief complaint of  Chief Complaint  Patient presents with  . Follow-up    6month abi  .  History of Present Illness: Patient returns today in follow up of her PAD.  She is doing well.  Her legs are not bothering her much at this point.  She was seen a couple of months ago and had resolution of her right leg DVT.  She is studies today for her peripheral arterial disease.  Her ABIs today are 0.95 on the right and 0.8 on the left which are stable to slightly improved from her previous study.  She does not have any ischemic rest pain, ulceration, or lifestyle limiting claudication.  Current Outpatient Medications  Medication Sig Dispense Refill  . acetaminophen (TYLENOL) 500 MG tablet Take 500 mg by mouth daily as needed for moderate pain or headache.     Marland Kitchen amLODipine (NORVASC) 10 MG tablet Take 10 mg by mouth daily.    Marland Kitchen apixaban (ELIQUIS) 5 MG TABS tablet Take 1 tablet (5 mg total) by mouth 2 (two) times daily for 30 days. 60 tablet 3  . aspirin EC 81 MG tablet Take 1 tablet (81 mg total) by mouth daily. 150 tablet 2  . Calcium Carb-Cholecalciferol (CALCIUM 600/VITAMIN D3 PO) Take 1 tablet by mouth daily.    Leslee Home 75 MG capsule TAKE 1 CAPSULE (75 MG TOTAL) BY MOUTH DAILY WITH BREAKFAST. TAKE FOR 21 DAYS ON, THEN 7 DAYS OFF. 21 capsule 3  . ibuprofen (ADVIL,MOTRIN) 400 MG tablet Take 400 mg by mouth 3 (three) times daily as needed.    Marland Kitchen letrozole (FEMARA) 2.5 MG tablet Take 1 tablet (2.5 mg total) by mouth daily. 90 tablet 3  . PREVIDENT 5000 BOOSTER PLUS 1.1 % PSTE Place 1 application onto teeth 2 (two) times daily.     . Vitamin D, Cholecalciferol, 400 units CAPS Take 400 Units by mouth daily.     Marland Kitchen lidocaine-prilocaine (EMLA) cream Apply 1 application topically as needed (port access). (Patient not taking: Reported on 06/06/2018) 30 g 2  . oxyCODONE-acetaminophen (PERCOCET/ROXICET) 5-325 MG tablet Take  1 tablet by mouth every 6 (six) hours as needed for moderate pain or severe pain. (Patient not taking: Reported on 06/06/2018) 28 tablet 0  . prochlorperazine (COMPAZINE) 10 MG tablet Take 1 tablet (10 mg total) by mouth every 6 (six) hours as needed (Nausea or vomiting). (Patient not taking: Reported on 06/06/2018) 30 tablet 1   No current facility-administered medications for this visit.     Past Medical History:  Diagnosis Date  . Breast cancer ([place])   . Cancer ([place])    left breast  . DVT (deep venous thrombosis) ([place])    2019  . Hypertension   . Port catheter in place 09/14/2016   Placed 09/10/2016 RT chest.    Past Surgical History:  Procedure Laterality Date  . ABDOMINAL HYSTERECTOMY    . APPENDECTOMY    . BREAST BIOPSY    . CYST EXCISION Right 09/2016   Buttocks  . IVC FILTER REMOVAL N/A 01/17/2018   Procedure: IVC FILTER REMOVAL;  Surgeon: Algernon Huxley, MD;  Location: [facility];  Service: Cardiovascular;  Laterality: N/A;  . LOWER EXTREMITY ANGIOGRAPHY Right 11/11/2017   Procedure: LOWER EXTREMITY ANGIOGRAPHY;  Surgeon: Katha Cabal, MD;  Location: [facility];  Service: Cardiovascular;  Laterality: Right;  . PERIPHERAL VASCULAR  THROMBECTOMY Right 08/30/2017   Procedure: PERIPHERAL VASCULAR THROMBECTOMY;  Surgeon: Algernon Huxley, MD;  Location: [facility];  Service: Cardiovascular;  Laterality: Right;  . PORTACATH PLACEMENT Right 09/10/2016   Procedure: INSERTION PORT-A-CATH;  Surgeon: Jules Husbands, MD;  Location: [facility];  Service: General;  Laterality: Right;    Social History       Tobacco Use  . Smoking status: Never Smoker  . Smokeless tobacco: Never Used  Substance Use Topics  . Alcohol use: No  . Drug use: No     Family History      Family History  Problem Relation Age of Onset  . Parkinson's disease Mother   . Bladder Cancer Father   . Lung cancer Father     No Known Allergies   REVIEW OF  SYSTEMS(Negative unless checked)  Constitutional: [] ??Weight loss[] ??Fever[] ??Chills Cardiac:[] ??Chest pain[] ??Chest pressure[] ??Palpitations [] ??Shortness of breath when laying flat [] ??Shortness of breath at rest [] ??Shortness of breath with exertion. Vascular: [] ??Pain in legs with walking[] ??Pain in legsat rest[] ??Pain in legs when laying flat [] ??Claudication [] ??Pain in feet when walking [] ??Pain in feet at rest [] ??Pain in feet when laying flat [x] ??History of DVT [x] ??Phlebitis [x] ??Swelling in legs [] ??Varicose veins [] ??Non-healing ulcers Pulmonary: [] ??Uses home oxygen [] ??Productive cough[] ??Hemoptysis [] ??Wheeze [] ??COPD [] ??Asthma Neurologic: [] ??Dizziness [] ??Blackouts [] ??Seizures [] ??History of stroke [] ??History of TIA[] ??Aphasia [] ??Temporary blindness[] ??Dysphagia [] ??Weaknessor numbness in arms [] ??Weakness or numbnessin legs Musculoskeletal: [x] ??Arthritis [] ??Joint swelling [] ??Joint pain [] ??Low back pain Hematologic:[] ??Easy bruising[] ??Easy bleeding [] ??Hypercoagulable state [] ??Anemic [] ??Hepatitis Gastrointestinal:[] ??Blood in stool[] ??Vomiting blood[] ??Gastroesophageal reflux/heartburn[] ??Abdominal pain Genitourinary: [] ??Chronic kidney disease [] ??Difficulturination [] ??Frequenturination [] ??Burning with urination[] ??Hematuria Skin: [] ??Rashes [] ??Ulcers [] ??Wounds Psychological: [] ??History of anxiety[] ??History of major depression.    Physical Examination  BP (!) 159/70 (BP Location: Right Arm)   Pulse (!) 53   Resp 16   Ht 5\' 2"  (1.575 m)   Wt 204 lb (92.5 kg)   BMI 37.31 kg/m  Gen:  WD/WN, NAD Head: [place]/AT, No temporalis wasting. Ear/Nose/Throat: Hearing grossly intact, nares w/o erythema or drainage Eyes: Conjunctiva clear. Sclera non-icteric Neck: Supple.  Trachea midline Pulmonary:  Good air movement, no use of accessory muscles.    Cardiac: RRR, no JVD Vascular:  Vessel Right Left  Radial Palpable Palpable                          PT  1+ palpable  1+ palpable  DP Palpable  1+ palpable    Musculoskeletal: M/S 5/5 throughout.  No deformity or atrophy.  Trace right lower extremity edema. Neurologic: Sensation grossly intact in extremities.  Symmetrical.  Speech is fluent.  Psychiatric: Judgment intact, Mood & affect appropriate for pt's clinical situation. Dermatologic: No rashes or ulcers noted.  No cellulitis or open wounds.       Labs Recent Results (from the past 2160 hour(s))  Cancer antigen 27.29     Status: Abnormal   Collection Time: 03/24/18  1:07 PM  Result Value Ref Range   CA 27.29 79.8 (H) 0.0 - 38.6 U/mL    Comment: (NOTE) Siemens Centaur Immunochemiluminometric Methodology (ICMA) Values obtained with different assay methods or kits cannot be used interchangeably. Results cannot be interpreted as absolute evidence of the presence or absence of malignant disease. Performed At: [facility] [place], [place] [id number] Rush Farmer MD [id number]   Comprehensive metabolic panel     Status: Abnormal   Collection Time: 03/24/18  1:07 PM  Result Value Ref Range   Sodium 138 135 - 145 mmol/L  Potassium 3.8 3.5 - 5.1 mmol/L   Chloride 106 98 - 111 mmol/L   CO2 25 22 - 32 mmol/L   Glucose, Bld 122 (H) 70 - 99 mg/dL   BUN 11 8 - 23 mg/dL   Creatinine, Ser 0.93 0.44 - 1.00 mg/dL   Calcium 9.5 8.9 - 10.3 mg/dL   Total Protein 6.9 6.5 - 8.1 g/dL   Albumin 4.1 3.5 - 5.0 g/dL   AST 29 15 - 41 U/L   ALT 29 0 - 44 U/L   Alkaline Phosphatase 52 38 - 126 U/L   Total Bilirubin 1.0 0.3 - 1.2 mg/dL   GFR calc non Af Amer >60 >60 mL/min   GFR calc Af Amer >60 >60 mL/min   Anion gap 7 5 - 15    Comment: Performed at [facility], [address] [place], [place] [address]  CBC with Differential/Platelet     Status: Abnormal   Collection Time: 03/24/18  1:07 PM   Result Value Ref Range   WBC 2.4 (L) 4.0 - 10.5 K/uL   RBC 3.32 (L) 3.87 - 5.11 MIL/uL   Hemoglobin 12.0 12.0 - 15.0 g/dL   HCT 34.3 (L) 36.0 - 46.0 %   MCV 103.3 (H) 80.0 - 100.0 fL   MCH 36.1 (H) 26.0 - 34.0 pg   MCHC 35.0 30.0 - 36.0 g/dL   RDW 14.2 11.5 - 15.5 %   Platelets 290 150 - 400 K/uL   nRBC 0.0 0.0 - 0.2 %   Neutrophils Relative % 49 %   Neutro Abs 1.2 (L) 1.7 - 7.7 K/uL   Lymphocytes Relative 39 %   Lymphs Abs 1.0 0.7 - 4.0 K/uL   Monocytes Relative 7 %   Monocytes Absolute 0.2 0.1 - 1.0 K/uL   Eosinophils Relative 3 %   Eosinophils Absolute 0.1 0.0 - 0.5 K/uL   Basophils Relative 2 %   Basophils Absolute 0.1 0.0 - 0.1 K/uL   Immature Granulocytes 0 %   Abs Immature Granulocytes 0.00 0.00 - 0.07 K/uL    Comment: Performed at [facility], [address] [place], [place] [address]  Protime-INR     Status: None   Collection Time: 05/05/18  7:57 AM  Result Value Ref Range   Prothrombin Time 13.5 11.4 - 15.2 seconds   INR 1.04     Comment: Performed at [facility], [address] [place], [place] [address]  Cancer antigen 27.29     Status: Abnormal   Collection Time: 05/05/18  7:57 AM  Result Value Ref Range   CA 27.29 81.0 (H) 0.0 - 38.6 U/mL    Comment: (NOTE) Siemens Centaur Immunochemiluminometric Methodology (ICMA) Values obtained with different assay methods or kits cannot be used interchangeably. Results cannot be interpreted as absolute evidence of the presence or absence of malignant disease. Performed At: [facility] [place], [place] [id number] Rush Farmer MD [id number]   Comprehensive metabolic panel     Status: Abnormal   Collection Time: 05/05/18  7:57 AM  Result Value Ref Range   Sodium 140 135 - 145 mmol/L   Potassium 3.7 3.5 - 5.1 mmol/L   Chloride 106 98 - 111 mmol/L   CO2 26 22 - 32 mmol/L   Glucose, Bld 128 (H) 70 - 99 mg/dL   BUN 12 8 - 23 mg/dL   Creatinine, Ser 0.83 0.44 - 1.00  mg/dL   Calcium 9.6 8.9 - 10.3 mg/dL   Total  Protein 7.2 6.5 - 8.1 g/dL   Albumin 4.3 3.5 - 5.0 g/dL   AST 26 15 - 41 U/L   ALT 25 0 - 44 U/L   Alkaline Phosphatase 55 38 - 126 U/L   Total Bilirubin 1.0 0.3 - 1.2 mg/dL   GFR calc non Af Amer >60 >60 mL/min   GFR calc Af Amer >60 >60 mL/min   Anion gap 8 5 - 15    Comment: Performed at [facility], [address] [place], [place] [address]  CBC with Differential/Platelet     Status: Abnormal   Collection Time: 05/05/18  7:57 AM  Result Value Ref Range   WBC 2.3 (L) 4.0 - 10.5 K/uL   RBC 3.53 (L) 3.87 - 5.11 MIL/uL   Hemoglobin 12.7 12.0 - 15.0 g/dL   HCT 36.5 36.0 - 46.0 %   MCV 103.4 (H) 80.0 - 100.0 fL   MCH 36.0 (H) 26.0 - 34.0 pg   MCHC 34.8 30.0 - 36.0 g/dL   RDW 14.6 11.5 - 15.5 %   Platelets 206 150 - 400 K/uL   nRBC 0.0 0.0 - 0.2 %   Neutrophils Relative % 48 %   Neutro Abs 1.1 (L) 1.7 - 7.7 K/uL   Lymphocytes Relative 36 %   Lymphs Abs 0.8 0.7 - 4.0 K/uL   Monocytes Relative 10 %   Monocytes Absolute 0.2 0.1 - 1.0 K/uL   Eosinophils Relative 3 %   Eosinophils Absolute 0.1 0.0 - 0.5 K/uL   Basophils Relative 2 %   Basophils Absolute 0.1 0.0 - 0.1 K/uL   Immature Granulocytes 1 %   Abs Immature Granulocytes 0.02 0.00 - 0.07 K/uL    Comment: Performed at [facility], [address] [place], [place] [address]  Comprehensive metabolic panel     Status: Abnormal   Collection Time: 05/18/18  1:41 PM  Result Value Ref Range   Sodium 138 135 - 145 mmol/L   Potassium 3.9 3.5 - 5.1 mmol/L   Chloride 108 98 - 111 mmol/L   CO2 25 22 - 32 mmol/L   Glucose, Bld 129 (H) 70 - 99 mg/dL   BUN 13 8 - 23 mg/dL   Creatinine, Ser 1.00 0.44 - 1.00 mg/dL   Calcium 9.3 8.9 - 10.3 mg/dL   Total Protein 7.0 6.5 - 8.1 g/dL   Albumin 4.1 3.5 - 5.0 g/dL   AST 26 15 - 41 U/L   ALT 26 0 - 44 U/L   Alkaline Phosphatase 61 38 - 126 U/L   Total Bilirubin 0.8 0.3 - 1.2 mg/dL   GFR calc non Af Amer 55 (L) >60 mL/min   GFR  calc Af Amer >60 >60 mL/min   Anion gap 5 5 - 15    Comment: Performed at [facility], [address] [place], [place] [address]  CBC with Differential/Platelet     Status: Abnormal   Collection Time: 05/18/18  1:41 PM  Result Value Ref Range   WBC 2.6 (L) 4.0 - 10.5 K/uL   RBC 3.40 (L) 3.87 - 5.11 MIL/uL   Hemoglobin 12.2 12.0 - 15.0 g/dL   HCT 35.0 (L) 36.0 - 46.0 %   MCV 102.9 (H) 80.0 - 100.0 fL   MCH 35.9 (H) 26.0 - 34.0 pg   MCHC 34.9 30.0 - 36.0 g/dL   RDW 14.0 11.5 - 15.5 %   Platelets 323 150 - 400 K/uL   nRBC 0.0 0.0 - 0.2 %  Neutrophils Relative % 52 %   Neutro Abs 1.4 (L) 1.7 - 7.7 K/uL   Lymphocytes Relative 34 %   Lymphs Abs 0.9 0.7 - 4.0 K/uL   Monocytes Relative 7 %   Monocytes Absolute 0.2 0.1 - 1.0 K/uL   Eosinophils Relative 4 %   Eosinophils Absolute 0.1 0.0 - 0.5 K/uL   Basophils Relative 2 %   Basophils Absolute 0.1 0.0 - 0.1 K/uL   WBC Morphology MORPHOLOGY UNREMARKABLE     Comment: DIFF CONFIRMED BY MANUAL   Immature Granulocytes 1 %   Abs Immature Granulocytes 0.02 0.00 - 0.07 K/uL    Comment: Performed at [facility], [address] [place], [place] [address]  Comprehensive metabolic panel     Status: Abnormal   Collection Time: 06/06/18 10:37 AM  Result Value Ref Range   Sodium 138 135 - 145 mmol/L   Potassium 4.1 3.5 - 5.1 mmol/L   Chloride 105 98 - 111 mmol/L   CO2 24 22 - 32 mmol/L   Glucose, Bld 117 (H) 70 - 99 mg/dL   BUN 14 8 - 23 mg/dL   Creatinine, Ser 0.87 0.44 - 1.00 mg/dL   Calcium 9.8 8.9 - 10.3 mg/dL   Total Protein 7.3 6.5 - 8.1 g/dL   Albumin 4.3 3.5 - 5.0 g/dL   AST 33 15 - 41 U/L   ALT 36 0 - 44 U/L   Alkaline Phosphatase 60 38 - 126 U/L   Total Bilirubin 1.0 0.3 - 1.2 mg/dL   GFR calc non Af Amer >60 >60 mL/min   GFR calc Af Amer >60 >60 mL/min   Anion gap 9 5 - 15    Comment: Performed at [facility], [address] [place], [place] [address]  CBC with Differential/Platelet     Status:  Abnormal   Collection Time: 06/06/18 10:37 AM  Result Value Ref Range   WBC 3.3 (L) 4.0 - 10.5 K/uL   RBC 3.51 (L) 3.87 - 5.11 MIL/uL   Hemoglobin 12.7 12.0 - 15.0 g/dL   HCT 36.6 36.0 - 46.0 %   MCV 104.3 (H) 80.0 - 100.0 fL   MCH 36.2 (H) 26.0 - 34.0 pg   MCHC 34.7 30.0 - 36.0 g/dL   RDW 14.8 11.5 - 15.5 %   Platelets 220 150 - 400 K/uL   nRBC 0.0 0.0 - 0.2 %   Neutrophils Relative % 46 %   Neutro Abs 1.5 (L) 1.7 - 7.7 K/uL   Lymphocytes Relative 36 %   Lymphs Abs 1.2 0.7 - 4.0 K/uL   Monocytes Relative 11 %   Monocytes Absolute 0.4 0.1 - 1.0 K/uL   Eosinophils Relative 3 %   Eosinophils Absolute 0.1 0.0 - 0.5 K/uL   Basophils Relative 3 %   Basophils Absolute 0.1 0.0 - 0.1 K/uL   Immature Granulocytes 1 %   Abs Immature Granulocytes 0.02 0.00 - 0.07 K/uL    Comment: Performed at [facility], [address] [place], [place] [address]    Radiology No results found.  Assessment/Plan Breast cancer in female [facility]) This increases her clotting risk and is likely a cause of her being hypercoagulable.  Hypertension blood pressure control important in reducing the progression of atherosclerotic disease. On appropriate oral medications.   Port catheter in place Placed last year by her Education officer, environmental and working well.  PAD (peripheral artery disease) ([facility]) Her ABIs today are 0.95 on the right and 0.8 on the  left which are stable to slightly improved from her previous study.  Doing well.  Continue current medical regimen.  Recheck in 1 year with ABIs    Leotis Pain, MD  06/22/2018 9:43 AM    This note was created with Dragon medical transcription system.  Any errors from dictation are purely unintentional

## 2018-06-22 NOTE — Patient Instructions (Signed)
Peripheral Vascular Disease  Peripheral vascular disease (PVD) is a disease of the blood vessels that are not part of your heart and brain. A simple term for PVD is poor circulation. In most cases, PVD narrows the blood vessels that carry blood from your heart to the rest of your body. This can reduce the supply of blood to your arms, legs, and internal organs, like your stomach or kidneys. However, PVD most often affects a person's lower legs and feet. Without treatment, PVD tends to get worse. PVD can also lead to acute ischemic limb. This is when an arm or leg suddenly cannot get enough blood. This is a medical emergency. Follow these instructions at home: Lifestyle  Do not use any products that contain nicotine or tobacco, such as cigarettes and e-cigarettes. If you need help quitting, ask your doctor.  Lose weight if you are overweight. Or, stay at a healthy weight as told by your doctor.  Eat a diet that is low in fat and cholesterol. If you need help, ask your doctor.  Exercise regularly. Ask your doctor for activities that are right for you. General instructions  Take over-the-counter and prescription medicines only as told by your doctor.  Take good care of your feet: ? Wear comfortable shoes that fit well. ? Check your feet often for any cuts or sores.  Keep all follow-up visits as told by your doctor This is important. Contact a doctor if:  You have cramps in your legs when you walk.  You have leg pain when you are at rest.  You have coldness in a leg or foot.  Your skin changes.  You are unable to get or have an erection (erectile dysfunction).  You have cuts or sores on your feet that do not heal. Get help right away if:  Your arm or leg turns cold, numb, and blue.  Your arms or legs become red, warm, swollen, painful, or numb.  You have chest pain.  You have trouble breathing.  You suddenly have weakness in your face, arm, or leg.  You become very  confused or you cannot speak.  You suddenly have a very bad headache.  You suddenly cannot see. Summary  Peripheral vascular disease (PVD) is a disease of the blood vessels.  A simple term for PVD is poor circulation. Without treatment, PVD tends to get worse.  Treatment may include exercise, low fat and low cholesterol diet, and quitting smoking. This information is not intended to replace advice given to you by your health care provider. Make sure you discuss any questions you have with your health care provider. Document Released: 06/24/2009 Document Revised: 05/07/2016 Document Reviewed: 05/07/2016 Elsevier Interactive Patient Education  2019 Elsevier Inc.  

## 2018-06-22 NOTE — Assessment & Plan Note (Signed)
Her ABIs today are 0.95 on the right and 0.8 on the left which are stable to slightly improved from her previous study.  Doing well.  Continue current medical regimen.  Recheck in 1 year with ABIs

## 2018-06-28 MED FILL — IBRANCE 75 MG CAPSULE: 75 | 28 days supply | Qty: 21 | Fill #2

## 2018-06-30 ENCOUNTER — Inpatient Hospital Stay: Payer: Medicare Other | Attending: Oncology

## 2018-06-30 ENCOUNTER — Other Ambulatory Visit: Payer: Self-pay

## 2018-06-30 DIAGNOSIS — Z79899 Other long term (current) drug therapy: Secondary | ICD-10-CM | POA: Insufficient documentation

## 2018-06-30 DIAGNOSIS — C50912 Malignant neoplasm of unspecified site of left female breast: Secondary | ICD-10-CM | POA: Insufficient documentation

## 2018-06-30 DIAGNOSIS — Z86718 Personal history of other venous thrombosis and embolism: Secondary | ICD-10-CM | POA: Diagnosis not present

## 2018-06-30 DIAGNOSIS — Z7901 Long term (current) use of anticoagulants: Secondary | ICD-10-CM | POA: Diagnosis not present

## 2018-06-30 DIAGNOSIS — I10 Essential (primary) hypertension: Secondary | ICD-10-CM | POA: Diagnosis not present

## 2018-06-30 DIAGNOSIS — Z170 Estrogen receptor positive status [ER+]: Secondary | ICD-10-CM | POA: Diagnosis not present

## 2018-06-30 DIAGNOSIS — H538 Other visual disturbances: Secondary | ICD-10-CM | POA: Diagnosis not present

## 2018-06-30 DIAGNOSIS — C7951 Secondary malignant neoplasm of bone: Secondary | ICD-10-CM

## 2018-06-30 DIAGNOSIS — D72819 Decreased white blood cell count, unspecified: Secondary | ICD-10-CM | POA: Diagnosis not present

## 2018-06-30 LAB — COMPREHENSIVE METABOLIC PANEL
ALT: 31 U/L (ref 0–44)
AST: 35 U/L (ref 15–41)
Albumin: 4.1 g/dL (ref 3.5–5.0)
Alkaline Phosphatase: 54 U/L (ref 38–126)
Anion gap: 9 (ref 5–15)
BUN: 10 mg/dL (ref 8–23)
CO2: 22 mmol/L (ref 22–32)
Calcium: 9.3 mg/dL (ref 8.9–10.3)
Chloride: 107 mmol/L (ref 98–111)
Creatinine, Ser: 0.8 mg/dL (ref 0.44–1.00)
GFR calc Af Amer: >60 mL/min (ref >60)
GFR calc non Af Amer: >60 mL/min (ref >60)
GLUCOSE: 147 mg/dL — AB (ref 70–99)
Potassium: 3.6 mmol/L (ref 3.5–5.1)
Sodium: 138 mmol/L (ref 135–145)
Total Bilirubin: 0.9 mg/dL (ref 0.3–1.2)
Total Protein: 7.2 g/dL (ref 6.5–8.1)

## 2018-06-30 LAB — CBC WITH DIFFERENTIAL/PLATELET
Abs Immature Granulocytes: 0.02 10*3/uL (ref 0.00–0.07)
Basophils Absolute: 0.1 10*3/uL (ref 0.0–0.1)
Basophils Relative: 2 %
Eosinophils Absolute: 0.1 10*3/uL (ref 0.0–0.5)
Eosinophils Relative: 4 %
HCT: 33.6 % — ABNORMAL LOW (ref 36.0–46.0)
Hemoglobin: 11.9 g/dL — ABNORMAL LOW (ref 12.0–15.0)
Immature Granulocytes: 1 %
Lymphocytes Relative: 39 %
Lymphs Abs: 0.9 10*3/uL (ref 0.7–4.0)
MCH: 37.1 pg — ABNORMAL HIGH (ref 26.0–34.0)
MCHC: 35.4 g/dL (ref 30.0–36.0)
MCV: 104.7 fL — AB (ref 80.0–100.0)
MONOS PCT: 9 %
Monocytes Absolute: 0.2 10*3/uL (ref 0.1–1.0)
Neutro Abs: 1 10*3/uL — ABNORMAL LOW (ref 1.7–7.7)
Neutrophils Relative %: 45 %
Platelets: 215 10*3/uL (ref 150–400)
RBC: 3.21 MIL/uL — ABNORMAL LOW (ref 3.87–5.11)
RDW: 14.9 % (ref 11.5–15.5)
WBC: 2.2 10*3/uL — ABNORMAL LOW (ref 4.0–10.5)
nRBC: 0 % (ref 0.0–0.2)

## 2018-07-01 LAB — CANCER ANTIGEN 27.29: CA 27.29: 76.4 U/mL — ABNORMAL HIGH (ref 0.0–38.6)

## 2018-07-14 ENCOUNTER — Telehealth: Payer: Self-pay | Admitting: *Deleted

## 2018-07-14 NOTE — Telephone Encounter (Signed)
Patient called to state she has a CT and bone scan scheduled for 4/10.  She wanted to know what the coronavirus going around is a something that is going to be put off for another date or should she come for.  Dr. Janese Banks says even if she put it out 4 weeks she feels like her still go to be having this coronavirus issue so she feels like she needs to go ahead and have the scan done on the schedule 4 /10.  She is agreeable to the plan

## 2018-07-19 ENCOUNTER — Telehealth: Payer: Self-pay | Admitting: Pharmacist

## 2018-07-19 NOTE — Telephone Encounter (Signed)
Oral Chemotherapy Pharmacist Encounter  Follow-Up Form  Called patient today to follow up regarding patient's oral chemotherapy medication:Ibrance (palbociclib)  Original Start date of oral chemotherapy:12/2016  Pt reports0tablets/doses of Ibrancemissed so far this cycle.  Pt reports the following side effects:None reported  Recent labs reviewed:CA 27.29 from 06/30/2018  New medications?:None reported  Other Issues: None reported  Patient knows to call the office with questions or concerns. Oral Oncology Clinic will continue to follow.  Darl Pikes, PharmD, BCPS, [facility] Hematology/Oncology Clinical Pharmacist [facility] [phone]  07/19/2018 3:57 PM

## 2018-07-21 ENCOUNTER — Other Ambulatory Visit: Payer: Self-pay

## 2018-07-22 ENCOUNTER — Ambulatory Visit
Admission: RE | Admit: 2018-07-22 | Discharge: 2018-07-22 | Disposition: A | Discharge status: Home / Self Care | Payer: Medicare Other | Source: Ambulatory Visit | Attending: Oncology | Admitting: Oncology

## 2018-07-22 ENCOUNTER — Encounter
Admission: RE | Admit: 2018-07-22 | Discharge: 2018-07-22 | Disposition: A | Discharge status: Home / Self Care | Payer: Medicare Other | Source: Ambulatory Visit | Attending: Oncology | Admitting: Oncology

## 2018-07-22 ENCOUNTER — Inpatient Hospital Stay: Payer: Medicare Other

## 2018-07-22 DIAGNOSIS — Z170 Estrogen receptor positive status [ER+]: Secondary | ICD-10-CM | POA: Diagnosis present

## 2018-07-22 DIAGNOSIS — C50912 Malignant neoplasm of unspecified site of left female breast: Secondary | ICD-10-CM

## 2018-07-22 DIAGNOSIS — C7951 Secondary malignant neoplasm of bone: Secondary | ICD-10-CM

## 2018-07-22 MED ORDER — TECHNETIUM TC 99M MEDRONATE IV KIT
20.0000 | PACK | Freq: Once | INTRAVENOUS | Status: AC | PRN
  Administered 2018-07-22: 24 via INTRAVENOUS

## 2018-07-22 MED ORDER — IOHEXOL 300 MG/ML  SOLN
100.0000 mL | Freq: Once | INTRAMUSCULAR | Status: AC | PRN
  Administered 2018-07-22: 10:00:00 100 mL via INTRAVENOUS

## 2018-07-26 ENCOUNTER — Other Ambulatory Visit: Payer: Self-pay | Admitting: *Deleted

## 2018-07-26 MED ORDER — APIXABAN 5 MG PO TABS: 5.0000 mg | ORAL_TABLET | Freq: Two times a day (BID) | ORAL | 6 refills | Status: DC

## 2018-07-26 MED FILL — IBRANCE 75 MG CAPSULE: 75 | 28 days supply | Qty: 21 | Fill #3

## 2018-07-26 NOTE — Telephone Encounter (Signed)
Patient called and left for Judeen Hammans to request a RF on Eliquis. Dr. Janese Banks. Please approve. Thanks.

## 2018-07-27 ENCOUNTER — Other Ambulatory Visit: Payer: Self-pay

## 2018-07-27 ENCOUNTER — Inpatient Hospital Stay: Payer: Medicare Other | Attending: Oncology

## 2018-07-27 DIAGNOSIS — I10 Essential (primary) hypertension: Secondary | ICD-10-CM | POA: Insufficient documentation

## 2018-07-27 DIAGNOSIS — Z79899 Other long term (current) drug therapy: Secondary | ICD-10-CM

## 2018-07-27 DIAGNOSIS — C50912 Malignant neoplasm of unspecified site of left female breast: Secondary | ICD-10-CM | POA: Diagnosis present

## 2018-07-27 DIAGNOSIS — Z86718 Personal history of other venous thrombosis and embolism: Secondary | ICD-10-CM | POA: Insufficient documentation

## 2018-07-27 DIAGNOSIS — H538 Other visual disturbances: Secondary | ICD-10-CM | POA: Diagnosis not present

## 2018-07-27 DIAGNOSIS — Z7901 Long term (current) use of anticoagulants: Secondary | ICD-10-CM | POA: Diagnosis not present

## 2018-07-27 DIAGNOSIS — D72819 Decreased white blood cell count, unspecified: Secondary | ICD-10-CM | POA: Diagnosis not present

## 2018-07-27 DIAGNOSIS — C7951 Secondary malignant neoplasm of bone: Secondary | ICD-10-CM | POA: Insufficient documentation

## 2018-07-27 DIAGNOSIS — Z170 Estrogen receptor positive status [ER+]: Secondary | ICD-10-CM | POA: Insufficient documentation

## 2018-07-27 LAB — CBC WITH DIFFERENTIAL/PLATELET
Abs Immature Granulocytes: 0.02 10*3/uL (ref 0.00–0.07)
Basophils Absolute: 0.1 10*3/uL (ref 0.0–0.1)
Basophils Relative: 2 %
Eosinophils Absolute: 0.1 10*3/uL (ref 0.0–0.5)
Eosinophils Relative: 3 %
HCT: 35 % — ABNORMAL LOW (ref 36.0–46.0)
Hemoglobin: 12.4 g/dL (ref 12.0–15.0)
Immature Granulocytes: 1 %
Lymphocytes Relative: 37 %
Lymphs Abs: 0.8 10*3/uL (ref 0.7–4.0)
MCH: 36.7 pg — ABNORMAL HIGH (ref 26.0–34.0)
MCHC: 35.4 g/dL (ref 30.0–36.0)
MCV: 103.6 fL — ABNORMAL HIGH (ref 80.0–100.0)
Monocytes Absolute: 0.2 10*3/uL (ref 0.1–1.0)
Monocytes Relative: 9 %
Neutro Abs: 1.1 10*3/uL — ABNORMAL LOW (ref 1.7–7.7)
Neutrophils Relative %: 48 %
Platelets: 195 10*3/uL (ref 150–400)
RBC: 3.38 MIL/uL — ABNORMAL LOW (ref 3.87–5.11)
RDW: 14.6 % (ref 11.5–15.5)
WBC: 2.2 10*3/uL — ABNORMAL LOW (ref 4.0–10.5)
nRBC: 0 % (ref 0.0–0.2)

## 2018-07-27 LAB — COMPREHENSIVE METABOLIC PANEL
ALT: 23 U/L (ref 0–44)
AST: 26 U/L (ref 15–41)
Albumin: 4.3 g/dL (ref 3.5–5.0)
Alkaline Phosphatase: 60 U/L (ref 38–126)
Anion gap: 6 (ref 5–15)
BUN: 10 mg/dL (ref 8–23)
CO2: 26 mmol/L (ref 22–32)
Calcium: 9.3 mg/dL (ref 8.9–10.3)
Chloride: 106 mmol/L (ref 98–111)
Creatinine, Ser: 0.89 mg/dL (ref 0.44–1.00)
GFR calc Af Amer: >60 mL/min (ref >60)
GFR calc non Af Amer: >60 mL/min (ref >60)
Glucose, Bld: 113 mg/dL — ABNORMAL HIGH (ref 70–99)
Potassium: 4.1 mmol/L (ref 3.5–5.1)
Sodium: 138 mmol/L (ref 135–145)
Total Bilirubin: 0.8 mg/dL (ref 0.3–1.2)
Total Protein: 7.4 g/dL (ref 6.5–8.1)

## 2018-07-28 ENCOUNTER — Inpatient Hospital Stay (HOSPITAL_BASED_OUTPATIENT_CLINIC_OR_DEPARTMENT_OTHER): Payer: Medicare Other | Admitting: Oncology

## 2018-07-28 ENCOUNTER — Ambulatory Visit: Payer: Medicare Other | Admitting: Oncology

## 2018-07-28 ENCOUNTER — Encounter: Payer: Self-pay | Admitting: Oncology

## 2018-07-28 ENCOUNTER — Other Ambulatory Visit: Payer: Self-pay | Admitting: *Deleted

## 2018-07-28 DIAGNOSIS — C50912 Malignant neoplasm of unspecified site of left female breast: Secondary | ICD-10-CM | POA: Diagnosis not present

## 2018-07-28 DIAGNOSIS — C7951 Secondary malignant neoplasm of bone: Secondary | ICD-10-CM

## 2018-07-28 DIAGNOSIS — Z79899 Other long term (current) drug therapy: Secondary | ICD-10-CM

## 2018-07-28 DIAGNOSIS — Z170 Estrogen receptor positive status [ER+]: Secondary | ICD-10-CM

## 2018-07-28 DIAGNOSIS — D702 Other drug-induced agranulocytosis: Secondary | ICD-10-CM

## 2018-07-28 LAB — CANCER ANTIGEN 27.29: CA 27.29: 81.2 U/mL — ABNORMAL HIGH (ref 0.0–38.6)

## 2018-07-28 MED ORDER — PROCHLORPERAZINE MALEATE 10 MG PO TABS: 10.0000 mg | ORAL_TABLET | Freq: Four times a day (QID) | ORAL | 2 refills | Status: DC | PRN

## 2018-07-28 NOTE — Progress Notes (Signed)
I connected with Victoria Steele on 07/28/18 at 11:00 AM EDT by telephone visit and verified that I am speaking with the correct person using two identifiers.   I discussed the limitations, risks, security and privacy concerns of performing an evaluation and management service by telemedicine and the availability of in-person appointments. I also discussed with the patient that there may be a patient responsible charge related to this service. The patient expressed understanding and agreed to proceed.  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 Patient is a 78 year old female with no significant comorbidities who noticed left breast mass about 6-9 months ago. She thought it would go awaybut it continued to increase in size and began to involve her skin. She also noted tenderness to palpation as well as intermittent sharp tingling pain. She was seen by Dr. Adora Fridge on 08/25/2016 and patient had a biopsy of her skin in his office which showed:DIAGNOSIS:  A. LEFT BREAST SKIN; EXCISION:  - INVASIVE CARCINOMA MORPHOLOGICALLY CONSISTENT WITH MAMMARY ORIGIN  INVOLVING THE DERMIS.   BREAST BIOMARKER TESTS  Estrogen Receptor (ER) Status: POSITIVE, >90%  Progesterone Receptor (PgR) Status: POSITIVE, >70%  Her2 negative  2. Patient underwent bilateral diagnostic mammogram on 08/31/2016 showed:IMPRESSION: 1. Known left breast cancer, retroareolar with probable extension to the nipple, measuring 4.3 cm greatest dimension by ultrasound, with associated architectural distortion and associated left nipple retraction, and with associated diffuse skin thickening on the left. 2. No enlarged or morphologically abnormal lymph nodes are seen in the left axilla by ultrasound. 3. No evidence of malignancy  within the right breast.  3. Patient was also complaining of some perirectal pain and underwent CT abdomen pelvis with contrast on 09/02/2016 which showed: IMPRESSION: Left colonic diverticulosis. No active diverticulitis.Fatty liver. No acute findings or evidence of metastatic disease in the abdomen or pelvis. Diffuse bony sclerotic metastases.  4. Other than that her breast pain patient overall feels well. Denies any unintentional weight loss. She continues to be active and care for her great-grandchildren. She has had a hysterectomy in the past. No family history of breast or ovarian cancer.  5. Given that she had inflammatory breast cancer- plan was to give 4 cycles of dose dense AC followed by possible mastectomy and treat bone mets with AI + ibrance  6. PET CT scan on 09/11/16 showed: IMPRESSION: 1. Low-grade but abnormal hypermetabolic activity associated with the cutaneous and sub areolar glandular tissues of the left breast, compatible with malignancy, representative SUV 3.9 compared to contralateral normal side 1.4. 2. Diffuse sclerotic osseous metastatic disease with low-grade hypermetabolic activity, a representative SUV along the left sacrum 5.4. 3. Other imaging findings of potential clinical significance: Aortic Atherosclerosis (ICD10-I70.0). Descending and sigmoid colon Diverticulosis.  7. Baseline MUGA scan showed EF of 71%. Baseline CA 27.29 elevated at 563.2  8. 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  9. Letrozole and ibrance started in sept 2018. zometa monthly also started. Baseline bone density scan normal  10. ibrance held in oct 2018 after cycle 1 due to Grade 3 neutropenia and thrombocytopenia.cycle 2 restaretd on 02/04/17 at 100 mg. Patient continued to have prolongeed grade 3 neutropenia and dose lowered to 75 mg  11. Patient had extensive RLE DVT in April 2019 and is currently on eliquis. She also  underwent venous thrombectomy and  thrombolytic therapy and IVC placement by Dr. Lucky Cowboy.She also has peripheral vascular disease secondary to atherosclerosis and underwent stenting with vascular surgery as well    Interval history reports no acute concerns over the phone today. Denies any fever, cough, sore throat or other complaints   Review of Systems  Constitutional: Positive for malaise/fatigue. Negative for chills, fever and weight loss.  HENT: Negative for congestion, ear discharge and nosebleeds.   Eyes: Negative for blurred vision.  Respiratory: Negative for cough, hemoptysis, sputum production, shortness of breath and wheezing.   Cardiovascular: Negative for chest pain, palpitations, orthopnea and claudication.  Gastrointestinal: Negative for abdominal pain, blood in stool, constipation, diarrhea, heartburn, melena, nausea and vomiting.  Genitourinary: Negative for dysuria, flank pain, frequency, hematuria and urgency.  Musculoskeletal: Negative for back pain, joint pain and myalgias.  Skin: Negative for rash.  Neurological: Negative for dizziness, tingling, focal weakness, seizures, weakness and headaches.  Endo/Heme/Allergies: Does not bruise/bleed easily.  Psychiatric/Behavioral: Negative for depression and suicidal ideas. The patient does not have insomnia.     No Known Allergies  Past Medical History:  Diagnosis Date  . Breast cancer ([place])   . Cancer ([place])    left breast  . DVT (deep venous thrombosis) ([place])    2019  . Hypertension   . Port catheter in place 09/14/2016   Placed 09/10/2016 RT chest.    Past Surgical History:  Procedure Laterality Date  . ABDOMINAL HYSTERECTOMY    . APPENDECTOMY    . BREAST BIOPSY    . CYST EXCISION Right 09/2016   Buttocks  . IVC FILTER REMOVAL N/A 01/17/2018   Procedure: IVC FILTER REMOVAL;  Surgeon: Algernon Huxley, MD;  Location: [facility];  Service: Cardiovascular;  Laterality: N/A;  . LOWER EXTREMITY ANGIOGRAPHY Right 11/11/2017   Procedure: LOWER  EXTREMITY ANGIOGRAPHY;  Surgeon: Katha Cabal, MD;  Location: [facility];  Service: Cardiovascular;  Laterality: Right;  . PERIPHERAL VASCULAR THROMBECTOMY Right 08/30/2017   Procedure: PERIPHERAL VASCULAR THROMBECTOMY;  Surgeon: Algernon Huxley, MD;  Location: [facility];  Service: Cardiovascular;  Laterality: Right;  . PORTACATH PLACEMENT Right 09/10/2016   Procedure: INSERTION PORT-A-CATH;  Surgeon: Jules Husbands, MD;  Location: [facility];  Service: General;  Laterality: Right;    Social History   Socioeconomic History  . Marital status: Married    Spouse name: Jeneen Rinks  . Number of children: 2  . Years of education: 6  . Highest education level: Bachelor's degree (e.g., BA, AB, BS)  Occupational History  . 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[employer]  Social Needs  . Financial resource strain: 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  . Food insecurity:    Worry: Never true    Inability: Never true  . Transportation needs:    Medical: No    Non-medical: No  Tobacco Use  . Smoking status: Never Smoker  . Smokeless tobacco: Never Used  Substance and Sexual Activity  . Alcohol use: No  . Drug use: No  . Sexual activity: Never  Lifestyle  . Physical activity:    Days per week: Patient refused    Minutes per session: Patient refused  . Stress: Not at all  Relationships  . Social connections:    Talks on phone: More than three times a week    Gets together: Once a week    Attends religious service: More than 4 times per year  Active member of club or organization: No    Attends meetings of clubs or organizations: Never    Relationship status: Not on file  . Intimate partner violence:    Fear of current or ex partner: Not on file    Emotionally abused: Not on file    Physically abused: Not on file    Forced sexual activity: Not on file  Other Topics Concern  . Not on file  Social History Narrative  . Not on file    Family History  Problem  Relation Age of Onset  . Parkinson's disease Mother   . Bladder Cancer Father   . Lung cancer Father      Current Outpatient Medications:  .  acetaminophen (TYLENOL) 500 MG tablet, Take 500 mg by mouth daily as needed for moderate pain or headache. , Disp: , Rfl:  .  amLODipine (NORVASC) 10 MG tablet, Take 10 mg by mouth daily., Disp: , Rfl:  .  apixaban (ELIQUIS) 5 MG TABS tablet, Take 1 tablet (5 mg total) by mouth 2 (two) times daily for 30 days., Disp: 60 tablet, Rfl: 6 .  aspirin EC 81 MG tablet, Take 1 tablet (81 mg total) by mouth daily., Disp: 150 tablet, Rfl: 2 .  Calcium Carb-Cholecalciferol (CALCIUM 600/VITAMIN D3 PO), Take 1 tablet by mouth daily., Disp: , Rfl:  .  IBRANCE 75 MG capsule, TAKE 1 CAPSULE (75 MG TOTAL) BY MOUTH DAILY WITH BREAKFAST. TAKE FOR 21 DAYS ON, THEN 7 DAYS OFF., Disp: 21 capsule, Rfl: 3 .  ibuprofen (ADVIL,MOTRIN) 400 MG tablet, Take 400 mg by mouth 3 (three) times daily as needed., Disp: , Rfl:  .  letrozole (FEMARA) 2.5 MG tablet, Take 1 tablet (2.5 mg total) by mouth daily., Disp: 90 tablet, Rfl: 3 .  lidocaine-prilocaine (EMLA) cream, Apply 1 application topically as needed (port access)., Disp: 30 g, Rfl: 2 .  PREVIDENT 5000 BOOSTER PLUS 1.1 % PSTE, Place 1 application onto teeth 2 (two) times daily. , Disp: , Rfl:  .  prochlorperazine (COMPAZINE) 10 MG tablet, Take 1 tablet (10 mg total) by mouth every 6 (six) hours as needed (Nausea or vomiting)., Disp: 30 tablet, Rfl: 2 .  Vitamin D, Cholecalciferol, 400 units CAPS, Take 400 Units by mouth daily. , Disp: , Rfl:  .  oxyCODONE-acetaminophen (PERCOCET/ROXICET) 5-325 MG tablet, Take 1 tablet by mouth every 6 (six) hours as needed for moderate pain or severe pain. (Patient not taking: Reported on 06/06/2018), Disp: 28 tablet, Rfl: 0  Ct Chest W Contrast  Result Date: 07/22/2018 CLINICAL DATA:  Left breast cancer EXAM: CT CHEST, ABDOMEN, AND PELVIS WITH CONTRAST TECHNIQUE: Multidetector CT imaging of the  chest, abdomen and pelvis was performed following the standard protocol during bolus administration of intravenous contrast. CONTRAST:  136m OMNIPAQUE IOHEXOL 300 MG/ML  SOLN COMPARISON:  03/17/2018 FINDINGS: CT CHEST FINDINGS Cardiovascular: The heart size is normal. No substantial pericardial effusion. Coronary artery calcification is evident. Atherosclerotic calcification is noted in the wall of the thoracic aorta. Right Port-A-Cath tip is positioned in the upper right atrium. Mediastinum/Nodes: No mediastinal lymphadenopathy. There is no hilar lymphadenopathy. The esophagus has normal imaging features. There is no axillary lymphadenopathy. Lungs/Pleura: The central tracheobronchial airways are patent. 2 mm right perifissural nodule (62/4) is stable. Adjacent 6 mm right perifissural nodule (62/4) is new since prior. Another 2 mm right subpleural nodule (79/4) is new since prior. Atelectasis noted in the lung bases bilaterally. Mosaic attenuation in the lungs bilaterally is nonspecific  but may reflect air trapping from small airways disease. Musculoskeletal: Diffuse sclerotic bony metastases are similar to prior. CT ABDOMEN PELVIS FINDINGS Hepatobiliary: No suspicious focal abnormality within the liver parenchyma. Tiny subcapsular low-density lesion lateral segment left liver (47/2) is stable. There is no evidence for gallstones, gallbladder wall thickening, or pericholecystic fluid. No intrahepatic or extrahepatic biliary dilation. Pancreas: No focal mass lesion. No dilatation of the main duct. No intraparenchymal cyst. No peripancreatic edema. Spleen: No splenomegaly. No focal mass lesion. Adrenals/Urinary Tract: No adrenal nodule or mass. Kidneys unremarkable. No evidence for hydroureter. The urinary bladder appears normal for the degree of distention. Stomach/Bowel: Stomach is unremarkable. No gastric wall thickening. No evidence of outlet obstruction. Duodenum is normally positioned as is the ligament of  Treitz. No small bowel wall thickening. No small bowel dilatation. The terminal ileum is normal. The appendix is not visualized, but there is no edema or inflammation in the region of the cecum. No gross colonic mass. No colonic wall thickening. Diverticular changes are noted in the left colon without evidence of diverticulitis. Vascular/Lymphatic: There is abdominal aortic atherosclerosis without aneurysm. There is no gastrohepatic or hepatoduodenal ligament lymphadenopathy. No intraperitoneal or retroperitoneal lymphadenopathy. No pelvic sidewall lymphadenopathy. Reproductive: The uterus is surgically absent. There is no adnexal mass. Other: No intraperitoneal free fluid. Musculoskeletal: The widespread/diffuse sclerotic bony metastatic involvement is similar to prior. IMPRESSION: 1. Interval development of several very tiny new perifissural nodules in the right lung, indeterminate but attention on follow-up recommended. 2. Similar appearance of the widespread sclerotic bone metastases. 3. No change tiny subcapsular lesion lateral segment left liver. 4.  Aortic Atherosclerois (ICD10-170.0) Electronically Signed   By: Misty Stanley M.D.   On: 07/22/2018 12:24   Nm Bone Scan Whole Body  Result Date: 07/22/2018 CLINICAL DATA:  Left breast cancer. EXAM: NUCLEAR MEDICINE WHOLE BODY BONE SCAN TECHNIQUE: Whole body anterior and posterior images were obtained approximately 3 hours after intravenous injection of radiopharmaceutical. RADIOPHARMACEUTICALS:  24.0 mCi Technetium-73mMDP IV COMPARISON:  CT scan of same day.  Bone scan of March 15, 2018. FINDINGS: There remains diffusely abnormal uptake involving the skull, sternum, thoracic and lumbar spine which is stable compared to prior exam. This is consistent with metastatic disease, but as noted on previously studies, this study appears to underestimate the extent of disease based on comparison to today's and recent CT scans. The ribs do not demonstrate an abnormal  amount of uptake on this study, although there does appear to be metastatic disease in this area based on CT scan. Stable abnormal uptake is seen involving both shoulders, knees and feet which most likely is degenerative in etiology. IMPRESSION: Stable findings consistent with diffuse osseous metastases compared to prior bone scans. As noted on prior bone scans, the amount of abnormal uptake on this study substantially underestimates the extent and severity of the metastatic disease seen on CT scans. Electronically Signed   By: JMarijo Conception M.D.   On: 07/22/2018 14:30   Ct Abdomen Pelvis W Contrast  Result Date: 07/22/2018 CLINICAL DATA:  Left breast cancer EXAM: CT CHEST, ABDOMEN, AND PELVIS WITH CONTRAST TECHNIQUE: Multidetector CT imaging of the chest, abdomen and pelvis was performed following the standard protocol during bolus administration of intravenous contrast. CONTRAST:  1079mOMNIPAQUE IOHEXOL 300 MG/ML  SOLN COMPARISON:  03/17/2018 FINDINGS: CT CHEST FINDINGS Cardiovascular: The heart size is normal. No substantial pericardial effusion. Coronary artery calcification is evident. Atherosclerotic calcification is noted in the wall of the thoracic aorta. Right  Port-A-Cath tip is positioned in the upper right atrium. Mediastinum/Nodes: No mediastinal lymphadenopathy. There is no hilar lymphadenopathy. The esophagus has normal imaging features. There is no axillary lymphadenopathy. Lungs/Pleura: The central tracheobronchial airways are patent. 2 mm right perifissural nodule (62/4) is stable. Adjacent 6 mm right perifissural nodule (62/4) is new since prior. Another 2 mm right subpleural nodule (79/4) is new since prior. Atelectasis noted in the lung bases bilaterally. Mosaic attenuation in the lungs bilaterally is nonspecific but may reflect air trapping from small airways disease. Musculoskeletal: Diffuse sclerotic bony metastases are similar to prior. CT ABDOMEN PELVIS FINDINGS Hepatobiliary: No  suspicious focal abnormality within the liver parenchyma. Tiny subcapsular low-density lesion lateral segment left liver (47/2) is stable. There is no evidence for gallstones, gallbladder wall thickening, or pericholecystic fluid. No intrahepatic or extrahepatic biliary dilation. Pancreas: No focal mass lesion. No dilatation of the main duct. No intraparenchymal cyst. No peripancreatic edema. Spleen: No splenomegaly. No focal mass lesion. Adrenals/Urinary Tract: No adrenal nodule or mass. Kidneys unremarkable. No evidence for hydroureter. The urinary bladder appears normal for the degree of distention. Stomach/Bowel: Stomach is unremarkable. No gastric wall thickening. No evidence of outlet obstruction. Duodenum is normally positioned as is the ligament of Treitz. No small bowel wall thickening. No small bowel dilatation. The terminal ileum is normal. The appendix is not visualized, but there is no edema or inflammation in the region of the cecum. No gross colonic mass. No colonic wall thickening. Diverticular changes are noted in the left colon without evidence of diverticulitis. Vascular/Lymphatic: There is abdominal aortic atherosclerosis without aneurysm. There is no gastrohepatic or hepatoduodenal ligament lymphadenopathy. No intraperitoneal or retroperitoneal lymphadenopathy. No pelvic sidewall lymphadenopathy. Reproductive: The uterus is surgically absent. There is no adnexal mass. Other: No intraperitoneal free fluid. Musculoskeletal: The widespread/diffuse sclerotic bony metastatic involvement is similar to prior. IMPRESSION: 1. Interval development of several very tiny new perifissural nodules in the right lung, indeterminate but attention on follow-up recommended. 2. Similar appearance of the widespread sclerotic bone metastases. 3. No change tiny subcapsular lesion lateral segment left liver. 4.  Aortic Atherosclerois (ICD10-170.0) Electronically Signed   By: Misty Stanley M.D.   On: 07/22/2018 12:24     No images are attached to the encounter.   CMP Latest Ref Rng & Units 07/27/2018  Glucose 70 - 99 mg/dL 113(H)  BUN 8 - 23 mg/dL 10  Creatinine 0.44 - 1.00 mg/dL 0.89  Sodium 135 - 145 mmol/L 138  Potassium 3.5 - 5.1 mmol/L 4.1  Chloride 98 - 111 mmol/L 106  CO2 22 - 32 mmol/L 26  Calcium 8.9 - 10.3 mg/dL 9.3  Total Protein 6.5 - 8.1 g/dL 7.4  Total Bilirubin 0.3 - 1.2 mg/dL 0.8  Alkaline Phos 38 - 126 U/L 60  AST 15 - 41 U/L 26  ALT 0 - 44 U/L 23   CBC Latest Ref Rng & Units 07/27/2018  WBC 4.0 - 10.5 K/uL 2.2(L)  Hemoglobin 12.0 - 15.0 g/dL 12.4  Hematocrit 36.0 - 46.0 % 35.0(L)  Platelets 150 - 400 K/uL 195     Assessment and plan:patient is a 78 yr old female with ER positive breast cancer bone only metastases on ibrance and letrozole. She is here for routine f/u of breast cancer on ibrance and letrozole  Patient will start her next cycle on 07/31/18.anc >1 and therefore she can proceed with it as planned.   I discussed ct chest abdomen pelvis findings with the patient. Bone mets are stable. No  new areas of metastatic disease. She does have new small peri- fissure nodules in right lung. I will plan to repeat scans in 3 months  She will continue letrozole. ibrance 3 weeks on 1 week off.    Follow-up instructions: See md on 5/14. Labs: cbc with diff/ cmp. Will get zometa  I discussed the assessment and treatment plan with the patient. The patient was provided an opportunity to ask questions and all were answered. The patient agreed with the plan and demonstrated an understanding of the instructions.   The patient was advised to call back or seek an in-person evaluation if the symptoms worsen or if the condition fails to improve as anticipated.  I provided 16 minutes of non face-to-face telephone visit time during this encounter, and > 50% was spent counseling as documented under my assessment & plan.  Visit Diagnosis: 1. High risk medication use   2. Bone metastases  ([place])   3. Primary cancer of left breast with metastasis to other site ([place])   4. Drug-induced neutropenia ([place])     Dr. Randa Evens, MD, MPH [facility] [phone] 07/28/2018 12:08 PM

## 2018-08-05 ENCOUNTER — Ambulatory Visit: Payer: Medicare Other | Admitting: Oncology

## 2018-08-05 ENCOUNTER — Other Ambulatory Visit: Payer: Medicare Other

## 2018-08-15 ENCOUNTER — Telehealth: Payer: Self-pay | Admitting: Pharmacy Technician

## 2018-08-15 NOTE — Telephone Encounter (Signed)
Oral Oncology Patient Advocate Encounter   Was successful in securing patient a $16000 grant from [employer] (PA[employer]) to provide copayment coverage for Ibrance.  This will keep the out of pocket expense at $0.     I have spoken with the patient.    The billing information is as follows and has been shared with [place].   RxBin: [id number] PCN:  [id number] Member ID: [id number] Group ID: [id number] Dates of Eligibility: 08/15/2018 through 08/15/2019  Kule Gascoigne CPHT [facility] Patient [place] Phone [phone] Fax [phone] 08/15/2018 2:46 PM

## 2018-08-17 ENCOUNTER — Other Ambulatory Visit: Payer: Self-pay | Admitting: Oncology

## 2018-08-17 DIAGNOSIS — Z170 Estrogen receptor positive status [ER+]: Principal | ICD-10-CM

## 2018-08-17 DIAGNOSIS — C50912 Malignant neoplasm of unspecified site of left female breast: Secondary | ICD-10-CM

## 2018-08-23 MED FILL — IBRANCE 75 MG CAPSULE: 75 | 28 days supply | Qty: 21 | Fill #0

## 2018-09-12 ENCOUNTER — Other Ambulatory Visit: Payer: Self-pay

## 2018-09-13 ENCOUNTER — Inpatient Hospital Stay: Payer: Medicare Other | Attending: Oncology | Admitting: *Deleted

## 2018-09-13 ENCOUNTER — Other Ambulatory Visit: Payer: Self-pay | Admitting: *Deleted

## 2018-09-13 ENCOUNTER — Encounter: Payer: Self-pay | Admitting: Oncology

## 2018-09-13 ENCOUNTER — Other Ambulatory Visit: Payer: Self-pay

## 2018-09-13 ENCOUNTER — Ambulatory Visit: Payer: Medicare Other

## 2018-09-13 ENCOUNTER — Inpatient Hospital Stay: Payer: Medicare Other | Attending: Oncology

## 2018-09-13 ENCOUNTER — Inpatient Hospital Stay (HOSPITAL_BASED_OUTPATIENT_CLINIC_OR_DEPARTMENT_OTHER): Payer: Medicare Other | Admitting: Oncology

## 2018-09-13 VITALS — BP 176/81 | HR 62 | Temp 98.1°F | Resp 18 | Ht 62.0 in | Wt 201.0 lb

## 2018-09-13 VITALS — BP 160/72 | HR 58 | Resp 18

## 2018-09-13 DIAGNOSIS — C7951 Secondary malignant neoplasm of bone: Secondary | ICD-10-CM

## 2018-09-13 DIAGNOSIS — C50912 Malignant neoplasm of unspecified site of left female breast: Secondary | ICD-10-CM

## 2018-09-13 DIAGNOSIS — Z86718 Personal history of other venous thrombosis and embolism: Secondary | ICD-10-CM | POA: Insufficient documentation

## 2018-09-13 DIAGNOSIS — Z7982 Long term (current) use of aspirin: Secondary | ICD-10-CM | POA: Diagnosis not present

## 2018-09-13 DIAGNOSIS — Z79811 Long term (current) use of aromatase inhibitors: Secondary | ICD-10-CM | POA: Diagnosis not present

## 2018-09-13 DIAGNOSIS — Z170 Estrogen receptor positive status [ER+]: Secondary | ICD-10-CM | POA: Diagnosis not present

## 2018-09-13 DIAGNOSIS — I10 Essential (primary) hypertension: Secondary | ICD-10-CM | POA: Diagnosis not present

## 2018-09-13 DIAGNOSIS — D702 Other drug-induced agranulocytosis: Secondary | ICD-10-CM

## 2018-09-13 DIAGNOSIS — Z79899 Other long term (current) drug therapy: Secondary | ICD-10-CM | POA: Diagnosis not present

## 2018-09-13 DIAGNOSIS — Z791 Long term (current) use of non-steroidal anti-inflammatories (NSAID): Secondary | ICD-10-CM | POA: Diagnosis not present

## 2018-09-13 DIAGNOSIS — Z7901 Long term (current) use of anticoagulants: Secondary | ICD-10-CM | POA: Diagnosis not present

## 2018-09-13 DIAGNOSIS — Z95828 Presence of other vascular implants and grafts: Secondary | ICD-10-CM

## 2018-09-13 DIAGNOSIS — Z7983 Long term (current) use of bisphosphonates: Secondary | ICD-10-CM

## 2018-09-13 DIAGNOSIS — D696 Thrombocytopenia, unspecified: Secondary | ICD-10-CM | POA: Insufficient documentation

## 2018-09-13 LAB — CBC WITH DIFFERENTIAL/PLATELET
Abs Immature Granulocytes: 0.01 10*3/uL (ref 0.00–0.07)
Basophils Absolute: 0.1 10*3/uL (ref 0.0–0.1)
Basophils Relative: 2 %
Eosinophils Absolute: 0.1 10*3/uL (ref 0.0–0.5)
Eosinophils Relative: 4 %
HCT: 36.2 % (ref 36.0–46.0)
Hemoglobin: 12.6 g/dL (ref 12.0–15.0)
Immature Granulocytes: 0 %
Lymphocytes Relative: 38 %
Lymphs Abs: 1 10*3/uL (ref 0.7–4.0)
MCH: 35.6 pg — ABNORMAL HIGH (ref 26.0–34.0)
MCHC: 34.8 g/dL (ref 30.0–36.0)
MCV: 102.3 fL — ABNORMAL HIGH (ref 80.0–100.0)
Monocytes Absolute: 0.2 10*3/uL (ref 0.1–1.0)
Monocytes Relative: 8 %
Neutro Abs: 1.3 10*3/uL — ABNORMAL LOW (ref 1.7–7.7)
Neutrophils Relative %: 48 %
Platelets: 231 10*3/uL (ref 150–400)
RBC: 3.54 MIL/uL — ABNORMAL LOW (ref 3.87–5.11)
RDW: 14.5 % (ref 11.5–15.5)
WBC: 2.8 10*3/uL — ABNORMAL LOW (ref 4.0–10.5)
nRBC: 0 % (ref 0.0–0.2)

## 2018-09-13 LAB — COMPREHENSIVE METABOLIC PANEL
ALT: 23 U/L (ref 0–44)
AST: 24 U/L (ref 15–41)
Albumin: 4.2 g/dL (ref 3.5–5.0)
Alkaline Phosphatase: 65 U/L (ref 38–126)
Anion gap: 10 (ref 5–15)
BUN: 16 mg/dL (ref 8–23)
CO2: 22 mmol/L (ref 22–32)
Calcium: 9.7 mg/dL (ref 8.9–10.3)
Chloride: 106 mmol/L (ref 98–111)
Creatinine, Ser: 0.91 mg/dL (ref 0.44–1.00)
GFR calc Af Amer: >60 mL/min (ref >60)
GFR calc non Af Amer: >60 mL/min (ref >60)
Glucose, Bld: 123 mg/dL — ABNORMAL HIGH (ref 70–99)
Potassium: 3.9 mmol/L (ref 3.5–5.1)
Sodium: 138 mmol/L (ref 135–145)
Total Bilirubin: 1.2 mg/dL (ref 0.3–1.2)
Total Protein: 7.5 g/dL (ref 6.5–8.1)

## 2018-09-13 MED ORDER — SODIUM CHLORIDE 0.9% FLUSH
10.0000 mL | Freq: Once | INTRAVENOUS | Status: AC
  Administered 2018-09-13: 10 mL via INTRAVENOUS
  Filled 2018-09-13: qty 10

## 2018-09-13 MED ORDER — HEPARIN SOD (PORK) LOCK FLUSH 100 UNIT/ML IV SOLN
500.0000 [IU] | Freq: Once | INTRAVENOUS | Status: AC
  Administered 2018-09-13: 500 [IU] via INTRAVENOUS
  Filled 2018-09-13: qty 5

## 2018-09-13 MED ORDER — LIDOCAINE-PRILOCAINE 2.5-2.5 % EX CREA: 1.0000 "application " | TOPICAL_CREAM | CUTANEOUS | 2 refills | Status: DC | PRN

## 2018-09-13 MED ORDER — ZOLEDRONIC ACID 4 MG/100ML IV SOLN
4.0000 mg | Freq: Once | INTRAVENOUS | Status: AC
  Administered 2018-09-13: 4 mg via INTRAVENOUS
  Filled 2018-09-13: qty 100

## 2018-09-13 MED ORDER — SODIUM CHLORIDE 0.9 % IV SOLN
Freq: Once | INTRAVENOUS | Status: AC
  Administered 2018-09-13: 11:00:00 via INTRAVENOUS
  Filled 2018-09-13: qty 250

## 2018-09-13 NOTE — Progress Notes (Signed)
Pt doing ok, has bilateral wrist pain and thumb pain and has hard time to knit. Wants to know if she can use cbd oil.

## 2018-09-13 NOTE — Progress Notes (Signed)
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 Patient is a 78 year old female with no significant comorbidities who noticed left breast mass about 6-9 months ago. She thought it would go awaybut it continued to increase in size and began to involve her skin. She also noted tenderness to palpation as well as intermittent sharp tingling pain. She was seen by Dr. Adora Fridge on 08/25/2016 and patient had a biopsy of her skin in his office which showed:DIAGNOSIS:  A. LEFT BREAST SKIN; EXCISION:  - INVASIVE CARCINOMA MORPHOLOGICALLY CONSISTENT WITH MAMMARY ORIGIN  INVOLVING THE DERMIS.   BREAST BIOMARKER TESTS  Estrogen Receptor (ER) Status: POSITIVE, >90%  Progesterone Receptor (PgR) Status: POSITIVE, >70%  Her2 negative  2. Patient underwent bilateral diagnostic mammogram on 08/31/2016 showed:IMPRESSION: 1. Known left breast cancer, retroareolar with probable extension to the nipple, measuring 4.3 cm greatest dimension by ultrasound, with associated architectural distortion and associated left nipple retraction, and with associated diffuse skin thickening on the left. 2. No enlarged or morphologically abnormal lymph nodes are seen in the left axilla by ultrasound. 3. No evidence of malignancy within the right breast.  3. Patient was also complaining of some perirectal pain and underwent CT abdomen pelvis with contrast on 09/02/2016 which showed: IMPRESSION: Left colonic diverticulosis. No active diverticulitis.Fatty liver. No acute findings or evidence of  metastatic disease in the abdomen or pelvis. Diffuse bony sclerotic metastases.  4. Other than that her breast pain patient overall feels well. Denies any unintentional weight loss. She continues to be active and care for her great-grandchildren. She has had a hysterectomy in the past. No family history of breast or ovarian cancer.  5. Given that she had inflammatory breast cancer- plan was to give 4 cycles of dose dense AC followed by possible mastectomy and treat bone mets with AI + ibrance  6. PET CT scan on 09/11/16 showed: IMPRESSION: 1. Low-grade but abnormal hypermetabolic activity associated with the cutaneous and sub areolar glandular tissues of the left breast, compatible with malignancy, representative SUV 3.9 compared to contralateral normal side 1.4. 2. Diffuse sclerotic osseous metastatic disease with low-grade hypermetabolic activity, a representative SUV along the left sacrum 5.4. 3. Other imaging findings of potential clinical significance: Aortic Atherosclerosis (ICD10-I70.0). Descending and sigmoid colon Diverticulosis.  7. Baseline MUGA scan showed EF of 71%. Baseline CA 27.29 elevated at 563.2  8. 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  9. Letrozole and ibrance started in sept 2018. zometa monthly also started. Baseline bone density scan normal  10. ibrance held in oct 2018 after cycle 1 due to Grade 3 neutropenia and thrombocytopenia.cycle 2 restaretd on 02/04/17 at 100 mg. Patient continued to have prolongeed grade 3 neutropenia and dose lowered to 75 mg  11. Patient had extensive RLE DVT in April 2019 and is currently on eliquis. She also underwent venous thrombectomy and thrombolytic therapy and IVC placement by Dr. Lucky Cowboy.She also has peripheral vascular disease secondary to atherosclerosis and underwent stenting with vascular surgery as well   Interval history-reports having pain in her bilateral wrists and is wondering if she can  try  CBD oil.  Otherwise tolerating Ibrance well with no significant side effects.  She also continues to take letrozole daily.  She will be starting her next cycle of Ibrance on 09/24/2018  ECOG PS- 1 Pain scale- 0 Opioid associated constipation- no  Review of systems- Review of Systems  Constitutional: Positive for malaise/fatigue. Negative for chills, fever and weight loss.  HENT: Negative for congestion, ear discharge and nosebleeds.   Eyes: Negative for blurred vision.  Respiratory: Negative for cough, hemoptysis, sputum production, shortness of breath and wheezing.   Cardiovascular: Negative for chest pain, palpitations, orthopnea and claudication.  Gastrointestinal: Negative for abdominal pain, blood in stool, constipation, diarrhea, heartburn, melena, nausea and vomiting.  Genitourinary: Negative for dysuria, flank pain, frequency, hematuria and urgency.  Musculoskeletal: Negative for back pain, joint pain and myalgias.  Skin: Negative for rash.  Neurological: Negative for dizziness, tingling, focal weakness, seizures, weakness and headaches.  Endo/Heme/Allergies: Does not bruise/bleed easily.  Psychiatric/Behavioral: Negative for depression and suicidal ideas. The patient does not have insomnia.       No Known Allergies   Past Medical History:  Diagnosis Date   Breast cancer ([place])    Cancer ([place])    left breast   DVT (deep venous thrombosis) ([place])    2019   Hypertension    Port catheter in place 09/14/2016   Placed 09/10/2016 RT chest.     Past Surgical History:  Procedure Laterality Date   ABDOMINAL HYSTERECTOMY     APPENDECTOMY     BREAST BIOPSY     CYST EXCISION Right 09/2016   Buttocks   IVC FILTER REMOVAL N/A 01/17/2018   Procedure: IVC FILTER REMOVAL;  Surgeon: Algernon Huxley, MD;  Location: [place] [facility];  Service: Cardiovascular;  Laterality: N/A;   LOWER EXTREMITY ANGIOGRAPHY Right 11/11/2017   Procedure: LOWER EXTREMITY ANGIOGRAPHY;   Surgeon: Katha Cabal, MD;  Location: [place] [facility];  Service: Cardiovascular;  Laterality: Right;   PERIPHERAL VASCULAR THROMBECTOMY Right 08/30/2017   Procedure: PERIPHERAL VASCULAR THROMBECTOMY;  Surgeon: Algernon Huxley, MD;  Location: [place] [facility];  Service: Cardiovascular;  Laterality: Right;   PORTACATH PLACEMENT Right 09/10/2016   Procedure: INSERTION PORT-A-CATH;  Surgeon: Jules Husbands, MD;  Location: [facility];  Service: General;  Laterality: Right;    Social History   Socioeconomic History   Marital status: Married    Spouse name: Jeneen Rinks   Number of children: 2   Years of education: 6   Highest education level: Bachelor's degree (e.g., BA, AB, BS)  Occupational History   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: 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   Food insecurity:    Worry: Never true    Inability: Never true   Transportation needs:    Medical: No    Non-medical: No  Tobacco Use   Smoking status: Never Smoker   Smokeless tobacco: Never Used  Substance and Sexual Activity   Alcohol use: No   Drug use: No   Sexual activity: Never  Lifestyle   Physical activity:    Days per week: Patient refused    Minutes per session: Patient refused   Stress: Not at all  Relationships   Social connections:    Talks on phone: More than three times a week    Gets together: Once a week    Attends religious service: More than 4 times per year    Active  member of club or organization: No    Attends meetings of clubs or organizations: Never    Relationship status: Not on file   Intimate partner violence:    Fear of current or ex partner: Not on file    Emotionally abused: Not on file    Physically abused: Not on file    Forced sexual activity: Not on file  Other Topics Concern   Not on file  Social History Narrative   Not on file    Family History  Problem Relation Age of Onset    Parkinson's disease Mother    Bladder Cancer Father    Lung cancer Father      Current Outpatient Medications:    acetaminophen (TYLENOL) 500 MG tablet, Take 500 mg by mouth daily as needed for moderate pain or headache. , Disp: , Rfl:    amLODipine (NORVASC) 10 MG tablet, Take 10 mg by mouth daily., Disp: , Rfl:    apixaban (ELIQUIS) 5 MG TABS tablet, Take 1 tablet (5 mg total) by mouth 2 (two) times daily for 30 days., Disp: 60 tablet, Rfl: 6   aspirin EC 81 MG tablet, Take 1 tablet (81 mg total) by mouth daily., Disp: 150 tablet, Rfl: 2   Calcium Carb-Cholecalciferol (CALCIUM 600/VITAMIN D3 PO), Take 1 tablet by mouth daily., Disp: , Rfl:    Cholecalciferol (VITAMIN D3) 10 MCG (400 UNIT) CAPS, Take 1 capsule by mouth daily., Disp: , Rfl:    IBRANCE 75 MG capsule, TAKE 1 CAPSULE (75 MG TOTAL) BY MOUTH DAILY WITH BREAKFAST. TAKE FOR 21 DAYS ON, THEN 7 DAYS OFF., Disp: 21 capsule, Rfl: 3   ibuprofen (ADVIL,MOTRIN) 400 MG tablet, Take 400 mg by mouth 3 (three) times daily as needed., Disp: , Rfl:    letrozole (FEMARA) 2.5 MG tablet, Take 1 tablet (2.5 mg total) by mouth daily., Disp: 90 tablet, Rfl: 3   lidocaine-prilocaine (EMLA) cream, Apply 1 application topically as needed (port access)., Disp: 30 g, Rfl: 2   PREVIDENT 5000 BOOSTER PLUS 1.1 % PSTE, Place 1 application onto teeth 2 (two) times daily. , Disp: , Rfl:    prochlorperazine (COMPAZINE) 10 MG tablet, Take 1 tablet (10 mg total) by mouth every 6 (six) hours as needed (Nausea or vomiting)., Disp: 30 tablet, Rfl: 2  Physical exam:  Vitals:   09/13/18 1044  BP: (!) 176/81  Pulse: 62  Resp: 18  Temp: 98.1 F (36.7 C)  TempSrc: Oral  Weight: 201 lb (91.2 kg)  Height: '5\' 2"'$  (1.575 m)   Physical Exam HENT:     Head: Normocephalic and atraumatic.  Eyes:     Pupils: Pupils are equal, round, and reactive to light.  Neck:     Musculoskeletal: Normal range of motion.  Cardiovascular:     Rate and Rhythm: Normal  rate and regular rhythm.     Heart sounds: Normal heart sounds.  Pulmonary:     Effort: Pulmonary effort is normal.     Breath sounds: Normal breath sounds.  Abdominal:     General: Bowel sounds are normal.     Palpations: Abdomen is soft.  Skin:    General: Skin is warm and dry.  Neurological:     Mental Status: She is alert and oriented to person, place, and time.      CMP Latest Ref Rng & Units 09/13/2018  Glucose 70 - 99 mg/dL 123(H)  BUN 8 - 23 mg/dL 16  Creatinine 0.44 - 1.00 mg/dL 0.91  Sodium 135 - 145 mmol/L 138  Potassium 3.5 - 5.1 mmol/L 3.9  Chloride 98 - 111 mmol/L 106  CO2 22 - 32 mmol/L 22  Calcium 8.9 - 10.3 mg/dL 9.7  Total Protein 6.5 - 8.1 g/dL 7.5  Total Bilirubin 0.3 - 1.2 mg/dL 1.2  Alkaline Phos 38 - 126 U/L 65  AST 15 - 41 U/L 24  ALT 0 - 44 U/L 23   CBC Latest Ref Rng & Units 09/13/2018  WBC 4.0 - 10.5 K/uL 2.8(L)  Hemoglobin 12.0 - 15.0 g/dL 12.6  Hematocrit 36.0 - 46.0 % 36.2  Platelets 150 - 400 K/uL 231      Assessment and plan- Patient is a 78 y.o. female with metastatic breast cancer and bone only metastases currently on letrozole and Ibrance  1.  She continues to tolerate Ibrance at 75 mg daily well and her ANC has remained more than 1.  She will also continue to take letrozole along with calcium and vitamin D daily.  She will be getting her Zometa today and she will continue to get better every 3 months.  2. Labs on July 7th or 8th. Cbc with diff/cmp/ca 27.29 and see Dr. Janese Banks. Scans after July visit   Visit Diagnosis 1. High risk medication use   2. Drug-induced neutropenia (HCC)   3. Long term (current) use of bisphosphonates   4. Bone metastases ([place])   5. Malignant neoplasm of left breast in female, estrogen receptor positive, unspecified site of breast [facility])      Dr. Randa Evens, MD, MPH [facility] [phone] 09/14/2018 8:07 AM

## 2018-09-21 MED FILL — IBRANCE 75 MG CAPSULE: 75 | 28 days supply | Qty: 21 | Fill #1

## 2018-09-29 ENCOUNTER — Telehealth: Payer: Self-pay | Admitting: *Deleted

## 2018-09-29 NOTE — Telephone Encounter (Signed)
Pt called for refill but when I went to refill it . She should have refills still on it. I called the pharmacy and she does have 4 refills left and she will gt it ready and I called the pt. To let her know she has refills on it and after this month she will have 3 more left. She will go an pick it up tom

## 2018-10-05 ENCOUNTER — Other Ambulatory Visit: Payer: Self-pay | Admitting: Pharmacist

## 2018-10-05 DIAGNOSIS — C50912 Malignant neoplasm of unspecified site of left female breast: Secondary | ICD-10-CM

## 2018-10-05 MED ORDER — PALBOCICLIB 75 MG PO TABS: 75.0000 mg | ORAL_TABLET | Freq: Every day | ORAL | 3 refills | Status: DC

## 2018-10-05 NOTE — Progress Notes (Signed)
Ibrance capsule to tablet switch

## 2018-10-18 ENCOUNTER — Inpatient Hospital Stay: Payer: Medicare Other | Attending: Oncology

## 2018-10-18 ENCOUNTER — Encounter: Payer: Self-pay | Admitting: Oncology

## 2018-10-18 ENCOUNTER — Inpatient Hospital Stay: Payer: Medicare Other

## 2018-10-18 ENCOUNTER — Inpatient Hospital Stay: Payer: Medicare Other | Admitting: Oncology

## 2018-10-18 ENCOUNTER — Other Ambulatory Visit: Payer: Self-pay

## 2018-10-18 VITALS — BP 150/85 | HR 68 | Temp 97.8°F | Resp 18 | Ht 62.0 in | Wt 200.7 lb

## 2018-10-18 DIAGNOSIS — Z7982 Long term (current) use of aspirin: Secondary | ICD-10-CM | POA: Insufficient documentation

## 2018-10-18 DIAGNOSIS — D702 Other drug-induced agranulocytosis: Secondary | ICD-10-CM

## 2018-10-18 DIAGNOSIS — Z170 Estrogen receptor positive status [ER+]: Secondary | ICD-10-CM | POA: Insufficient documentation

## 2018-10-18 DIAGNOSIS — Z79811 Long term (current) use of aromatase inhibitors: Secondary | ICD-10-CM | POA: Insufficient documentation

## 2018-10-18 DIAGNOSIS — C50912 Malignant neoplasm of unspecified site of left female breast: Secondary | ICD-10-CM

## 2018-10-18 DIAGNOSIS — I10 Essential (primary) hypertension: Secondary | ICD-10-CM | POA: Insufficient documentation

## 2018-10-18 DIAGNOSIS — Z79899 Other long term (current) drug therapy: Secondary | ICD-10-CM

## 2018-10-18 DIAGNOSIS — C7951 Secondary malignant neoplasm of bone: Secondary | ICD-10-CM

## 2018-10-18 LAB — CBC WITH DIFFERENTIAL/PLATELET
Abs Immature Granulocytes: 0 10*3/uL (ref 0.00–0.07)
Basophils Absolute: 0 10*3/uL (ref 0.0–0.1)
Basophils Relative: 2 %
Eosinophils Absolute: 0 10*3/uL (ref 0.0–0.5)
Eosinophils Relative: 1 %
HCT: 37.1 % (ref 36.0–46.0)
Hemoglobin: 12.9 g/dL (ref 12.0–15.0)
Immature Granulocytes: 0 %
Lymphocytes Relative: 41 %
Lymphs Abs: 0.9 10*3/uL (ref 0.7–4.0)
MCH: 35.6 pg — ABNORMAL HIGH (ref 26.0–34.0)
MCHC: 34.8 g/dL (ref 30.0–36.0)
MCV: 102.5 fL — ABNORMAL HIGH (ref 80.0–100.0)
Monocytes Absolute: 0.2 10*3/uL (ref 0.1–1.0)
Monocytes Relative: 8 %
Neutro Abs: 1.1 10*3/uL — ABNORMAL LOW (ref 1.7–7.7)
Neutrophils Relative %: 48 %
Platelets: 186 10*3/uL (ref 150–400)
RBC: 3.62 MIL/uL — ABNORMAL LOW (ref 3.87–5.11)
RDW: 15 % (ref 11.5–15.5)
WBC: 2.3 10*3/uL — ABNORMAL LOW (ref 4.0–10.5)
nRBC: 0 % (ref 0.0–0.2)

## 2018-10-18 LAB — COMPREHENSIVE METABOLIC PANEL
ALT: 25 U/L (ref 0–44)
AST: 25 U/L (ref 15–41)
Albumin: 4.4 g/dL (ref 3.5–5.0)
Alkaline Phosphatase: 57 U/L (ref 38–126)
Anion gap: 11 (ref 5–15)
BUN: 13 mg/dL (ref 8–23)
CO2: 24 mmol/L (ref 22–32)
Calcium: 9.9 mg/dL (ref 8.9–10.3)
Chloride: 104 mmol/L (ref 98–111)
Creatinine, Ser: 0.81 mg/dL (ref 0.44–1.00)
GFR calc Af Amer: >60 mL/min (ref >60)
GFR calc non Af Amer: >60 mL/min (ref >60)
Glucose, Bld: 118 mg/dL — ABNORMAL HIGH (ref 70–99)
Potassium: 4.6 mmol/L (ref 3.5–5.1)
Sodium: 139 mmol/L (ref 135–145)
Total Bilirubin: 1 mg/dL (ref 0.3–1.2)
Total Protein: 7.5 g/dL (ref 6.5–8.1)

## 2018-10-18 MED FILL — IBRANCE 75 MG TABS: 75 | 28 days supply | Qty: 21 | Fill #0

## 2018-10-18 NOTE — Progress Notes (Signed)
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 Patient is a 78 year old female with no significant comorbidities who noticed left breast mass about 6-9 months ago. She thought it would go awaybut it continued to increase in size and began to involve her skin. She also noted tenderness to palpation as well as intermittent sharp tingling pain. She was seen by Dr. Adora Fridge on 08/25/2016 and patient had a biopsy of her skin in his office which showed:DIAGNOSIS:  A. LEFT BREAST SKIN; EXCISION:  - INVASIVE CARCINOMA MORPHOLOGICALLY CONSISTENT WITH MAMMARY ORIGIN  INVOLVING THE DERMIS.   BREAST BIOMARKER TESTS  Estrogen Receptor (ER) Status: POSITIVE, >90%  Progesterone Receptor (PgR) Status: POSITIVE, >70%  Her2 negative  2. Patient underwent bilateral diagnostic mammogram on 08/31/2016 showed:IMPRESSION: 1. Known left breast cancer, retroareolar with probable extension to the nipple, measuring 4.3 cm greatest dimension by ultrasound, with associated architectural distortion and associated left nipple retraction, and with associated diffuse skin thickening on the left. 2. No enlarged or morphologically abnormal lymph nodes are seen in the left axilla by ultrasound. 3. No evidence of malignancy within the right breast.  3. Patient was also complaining of some perirectal pain and underwent CT abdomen pelvis with contrast on 09/02/2016 which showed: IMPRESSION: Left colonic diverticulosis. No active diverticulitis.Fatty liver. No acute findings or evidence of  metastatic disease in the abdomen or pelvis. Diffuse bony sclerotic metastases.  4. Other than that her breast pain patient overall feels well. Denies any unintentional weight loss. She continues to be active and care for her great-grandchildren. She has had a hysterectomy in the past. No family history of breast or ovarian cancer.  5. Given that she had inflammatory breast cancer- plan was to give 4 cycles of dose dense AC followed by possible mastectomy and treat bone mets with AI + ibrance  6. PET CT scan on 09/11/16 showed: IMPRESSION: 1. Low-grade but abnormal hypermetabolic activity associated with the cutaneous and sub areolar glandular tissues of the left breast, compatible with malignancy, representative SUV 3.9 compared to contralateral normal side 1.4. 2. Diffuse sclerotic osseous metastatic disease with low-grade hypermetabolic activity, a representative SUV along the left sacrum 5.4. 3. Other imaging findings of potential clinical significance: Aortic Atherosclerosis (ICD10-I70.0). Descending and sigmoid colon Diverticulosis.  7. Baseline MUGA scan showed EF of 71%. Baseline CA 27.29 elevated at 563.2  8. 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  9. Letrozole and ibrance started in sept 2018. zometa monthly also started. Baseline bone density scan normal  10. ibrance held in oct 2018 after cycle 1 due to Grade 3 neutropenia and thrombocytopenia.cycle 2 restaretd on 02/04/17 at 100 mg. Patient continued to have prolongeed grade 3 neutropenia and dose lowered to 75 mg  11. Patient had extensive RLE DVT in April 2019 and is currently on eliquis. She also underwent venous thrombectomy and thrombolytic therapy and IVC placement by Dr. Lucky Cowboy.She also has peripheral vascular disease secondary to atherosclerosis and underwent stenting with vascular surgery as well   Interval history-she is doing well on Ibrance and letrozole.  Denies any specific  complaints at  this time.  Appetite is good and weight is stable.  ECOG PS- 1 Pain scale- 0 Opioid associated constipation- no  Review of systems- Review of Systems  Constitutional: Negative for chills, fever, malaise/fatigue and weight loss.  HENT: Negative for congestion, ear discharge and nosebleeds.   Eyes: Negative for blurred vision.  Respiratory: Negative for cough, hemoptysis, sputum production, shortness of breath and wheezing.   Cardiovascular: Negative for chest pain, palpitations, orthopnea and claudication.  Gastrointestinal: Negative for abdominal pain, blood in stool, constipation, diarrhea, heartburn, melena, nausea and vomiting.  Genitourinary: Negative for dysuria, flank pain, frequency, hematuria and urgency.  Musculoskeletal: Negative for back pain, joint pain and myalgias.  Skin: Negative for rash.  Neurological: Negative for dizziness, tingling, focal weakness, seizures, weakness and headaches.  Endo/Heme/Allergies: Does not bruise/bleed easily.  Psychiatric/Behavioral: Negative for depression and suicidal ideas. The patient does not have insomnia.       No Known Allergies   Past Medical History:  Diagnosis Date  . Breast cancer ([place])   . Cancer ([place])    left breast  . DVT (deep venous thrombosis) ([place])    2019  . Hypertension   . Port catheter in place 09/14/2016   Placed 09/10/2016 RT chest.     Past Surgical History:  Procedure Laterality Date  . ABDOMINAL HYSTERECTOMY    . APPENDECTOMY    . BREAST BIOPSY    . CYST EXCISION Right 09/2016   Buttocks  . IVC FILTER REMOVAL N/A 01/17/2018   Procedure: IVC FILTER REMOVAL;  Surgeon: Algernon Huxley, MD;  Location: [place] [facility];  Service: Cardiovascular;  Laterality: N/A;  . LOWER EXTREMITY ANGIOGRAPHY Right 11/11/2017   Procedure: LOWER EXTREMITY ANGIOGRAPHY;  Surgeon: Katha Cabal, MD;  Location: [place] [facility];  Service: Cardiovascular;  Laterality: Right;  . PERIPHERAL VASCULAR  THROMBECTOMY Right 08/30/2017   Procedure: PERIPHERAL VASCULAR THROMBECTOMY;  Surgeon: Algernon Huxley, MD;  Location: [place] [facility];  Service: Cardiovascular;  Laterality: Right;  . PORTACATH PLACEMENT Right 09/10/2016   Procedure: INSERTION PORT-A-CATH;  Surgeon: Jules Husbands, MD;  Location: [facility];  Service: General;  Laterality: Right;    Social History   Socioeconomic History  . Marital status: Married    Spouse name: Jeneen Rinks  . Number of children: 2  . Years of education: 6  . Highest education level: Bachelor's degree (e.g., BA, AB, BS)  Occupational History  . 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 Financial resource strain: 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  . Food insecurity    Worry: Never true    Inability: Never true  . Transportation needs    Medical: No    Non-medical: No  Tobacco Use  . Smoking status: Never Smoker  . Smokeless tobacco: Never Used  Substance and Sexual Activity  . Alcohol use: No  . Drug use: No  . Sexual activity: Never  Lifestyle  . Physical activity    Days per week: Patient refused    Minutes per session: Patient refused  . Stress: Not at all  Relationships  . Social connections    Talks on phone: More than three times a week    Gets together: Once a week    Attends religious service: More than 4 times per year    Active member of club or organization: No    Attends meetings of clubs or organizations: Never    Relationship status: Not on file  .  Intimate partner violence    Fear of current or ex partner: Not on file    Emotionally abused: Not on file    Physically abused: Not on file    Forced sexual activity: Not on file  Other Topics Concern  . Not on file  Social History Narrative  . Not on file    Family History  Problem Relation Age of Onset  . Parkinson's disease Mother   . Bladder Cancer Father   . Lung cancer Father      Current Outpatient Medications:  .  acetaminophen  (TYLENOL) 500 MG tablet, Take 500 mg by mouth daily as needed for moderate pain or headache. , Disp: , Rfl:  .  amLODipine (NORVASC) 10 MG tablet, Take 10 mg by mouth daily., Disp: , Rfl:  .  apixaban (ELIQUIS) 5 MG TABS tablet, Take 1 tablet (5 mg total) by mouth 2 (two) times daily for 30 days., Disp: 60 tablet, Rfl: 6 .  aspirin EC 81 MG tablet, Take 1 tablet (81 mg total) by mouth daily., Disp: 150 tablet, Rfl: 2 .  Calcium Carb-Cholecalciferol (CALCIUM 600/VITAMIN D3 PO), Take 1 tablet by mouth daily., Disp: , Rfl:  .  Cholecalciferol (VITAMIN D3) 10 MCG (400 UNIT) CAPS, Take 1 capsule by mouth daily., Disp: , Rfl:  .  letrozole (FEMARA) 2.5 MG tablet, Take 1 tablet (2.5 mg total) by mouth daily., Disp: 90 tablet, Rfl: 3 .  palbociclib (IBRANCE) 75 MG tablet, Take 1 tablet (75 mg total) by mouth daily. Take for 21 days on, 7 days off, repeat every 28 days., Disp: 21 tablet, Rfl: 3 .  PREVIDENT 5000 BOOSTER PLUS 1.1 % PSTE, Place 1 application onto teeth 2 (two) times daily. , Disp: , Rfl:  .  ibuprofen (ADVIL,MOTRIN) 400 MG tablet, Take 400 mg by mouth 3 (three) times daily as needed., Disp: , Rfl:  .  lidocaine-prilocaine (EMLA) cream, Apply 1 application topically as needed (port access). (Patient not taking: Reported on 10/18/2018), Disp: 30 g, Rfl: 2 .  prochlorperazine (COMPAZINE) 10 MG tablet, Take 1 tablet (10 mg total) by mouth every 6 (six) hours as needed (Nausea or vomiting). (Patient not taking: Reported on 10/18/2018), Disp: 30 tablet, Rfl: 2  Physical exam:  Vitals:   10/18/18 1107  BP: (!) 150/85  Pulse: 68  Resp: 18  Temp: 97.8 F (36.6 C)  TempSrc: Tympanic  SpO2: 98%  Weight: 200 lb 11.2 oz (91 kg)  Height: _0  (1.575 m)   Physical Exam Constitutional:      General: She is not in acute distress. HENT:     Head: Normocephalic and atraumatic.  Eyes:     Pupils: Pupils are equal, round, and reactive to light.  Neck:     Musculoskeletal: Normal range of motion.   Cardiovascular:     Rate and Rhythm: Normal rate and regular rhythm.     Heart sounds: Normal heart sounds.  Pulmonary:     Effort: Pulmonary effort is normal.     Breath sounds: Normal breath sounds.  Abdominal:     General: Bowel sounds are normal.     Palpations: Abdomen is soft.  Skin:    General: Skin is warm and dry.  Neurological:     Mental Status: She is alert and oriented to person, place, and time.      CMP Latest Ref Rng & Units 10/18/2018  Glucose 70 - 99 mg/dL 118(H)  BUN 8 - 23 mg/dL 13  Creatinine  0.44 - 1.00 mg/dL 0.81  Sodium 135 - 145 mmol/L 139  Potassium 3.5 - 5.1 mmol/L 4.6  Chloride 98 - 111 mmol/L 104  CO2 22 - 32 mmol/L 24  Calcium 8.9 - 10.3 mg/dL 9.9  Total Protein 6.5 - 8.1 g/dL 7.5  Total Bilirubin 0.3 - 1.2 mg/dL 1.0  Alkaline Phos 38 - 126 U/L 57  AST 15 - 41 U/L 25  ALT 0 - 44 U/L 25   CBC Latest Ref Rng & Units 10/18/2018  WBC 4.0 - 10.5 K/uL 2.3(L)  Hemoglobin 12.0 - 15.0 g/dL 12.9  Hematocrit 36.0 - 46.0 % 37.1  Platelets 150 - 400 K/uL 186      Assessment and plan- Patient is a 78 y.o. female with metastatic breast cancer and bone only metastases currently on letrozole and Ibrance.  This is a routine follow-up visit of breast cancer on Ibrance  This is patient's of week and patient will restart taking her Ibrance in 3 days time.  Although she has leukopenia and her ANC is greater than 1 and she will continue Ibrance at 75 mg 3 weeks on and one-week off.  She is also taking her letrozole along with calcium and vitamin D.  I will see her back in 4 weeks time with CBC with differential, CMP and CA-27-29.  She will get a CT chest abdomen and pelvis with contrast and bone scan prior.  Patient is also getting Zometa every 3 months for bone metastases and she is due for her next dose in September 2020.   Visit Diagnosis 1. Bone metastases ([place])   2. Malignant neoplasm of left breast in female, estrogen receptor positive, unspecified site of  breast ([place])   3. Drug-induced neutropenia (HCC)   4. High risk medication use      Dr. Randa Evens, MD, MPH [facility] [phone] 10/18/2018 2:44 PM

## 2018-10-19 LAB — CANCER ANTIGEN 27.29: CA 27.29: 100.8 U/mL — ABNORMAL HIGH (ref 0.0–38.6)

## 2018-11-11 ENCOUNTER — Other Ambulatory Visit: Payer: Self-pay

## 2018-11-11 ENCOUNTER — Ambulatory Visit
Admission: RE | Admit: 2018-11-11 | Discharge: 2018-11-11 | Disposition: A | Discharge status: Home / Self Care | Payer: Medicare Other | Source: Ambulatory Visit | Attending: Oncology | Admitting: Oncology

## 2018-11-11 DIAGNOSIS — C50912 Malignant neoplasm of unspecified site of left female breast: Secondary | ICD-10-CM | POA: Insufficient documentation

## 2018-11-11 DIAGNOSIS — C7951 Secondary malignant neoplasm of bone: Secondary | ICD-10-CM | POA: Insufficient documentation

## 2018-11-11 DIAGNOSIS — Z170 Estrogen receptor positive status [ER+]: Secondary | ICD-10-CM | POA: Diagnosis present

## 2018-11-11 MED ORDER — IOHEXOL 300 MG/ML  SOLN
100.0000 mL | Freq: Once | INTRAMUSCULAR | Status: AC | PRN
  Administered 2018-11-11: 100 mL via INTRAVENOUS

## 2018-11-15 ENCOUNTER — Telehealth: Payer: Self-pay | Admitting: *Deleted

## 2018-11-15 ENCOUNTER — Inpatient Hospital Stay: Payer: Medicare Other | Attending: Oncology

## 2018-11-15 ENCOUNTER — Other Ambulatory Visit: Payer: Self-pay

## 2018-11-15 ENCOUNTER — Encounter: Payer: Self-pay | Admitting: Oncology

## 2018-11-15 ENCOUNTER — Inpatient Hospital Stay: Payer: Medicare Other | Admitting: Oncology

## 2018-11-15 VITALS — BP 141/78 | HR 74 | Temp 97.6°F | Resp 18 | Wt 202.1 lb

## 2018-11-15 DIAGNOSIS — K5792 Diverticulitis of intestine, part unspecified, without perforation or abscess without bleeding: Secondary | ICD-10-CM | POA: Diagnosis not present

## 2018-11-15 DIAGNOSIS — Z79899 Other long term (current) drug therapy: Secondary | ICD-10-CM | POA: Diagnosis not present

## 2018-11-15 DIAGNOSIS — C7951 Secondary malignant neoplasm of bone: Secondary | ICD-10-CM

## 2018-11-15 DIAGNOSIS — D701 Agranulocytosis secondary to cancer chemotherapy: Secondary | ICD-10-CM | POA: Insufficient documentation

## 2018-11-15 DIAGNOSIS — Z170 Estrogen receptor positive status [ER+]: Secondary | ICD-10-CM | POA: Diagnosis not present

## 2018-11-15 DIAGNOSIS — Z79811 Long term (current) use of aromatase inhibitors: Secondary | ICD-10-CM | POA: Diagnosis not present

## 2018-11-15 DIAGNOSIS — C50912 Malignant neoplasm of unspecified site of left female breast: Secondary | ICD-10-CM

## 2018-11-15 DIAGNOSIS — D702 Other drug-induced agranulocytosis: Secondary | ICD-10-CM

## 2018-11-15 LAB — CBC WITH DIFFERENTIAL/PLATELET
Abs Immature Granulocytes: 0.06 10*3/uL (ref 0.00–0.07)
Basophils Absolute: 0 10*3/uL (ref 0.0–0.1)
Basophils Relative: 2 %
Eosinophils Absolute: 0.1 10*3/uL (ref 0.0–0.5)
Eosinophils Relative: 3 %
HCT: 36.1 % (ref 36.0–46.0)
Hemoglobin: 12.7 g/dL (ref 12.0–15.0)
Immature Granulocytes: 3 %
Lymphocytes Relative: 41 %
Lymphs Abs: 0.8 10*3/uL (ref 0.7–4.0)
MCH: 36.1 pg — ABNORMAL HIGH (ref 26.0–34.0)
MCHC: 35.2 g/dL (ref 30.0–36.0)
MCV: 102.6 fL — ABNORMAL HIGH (ref 80.0–100.0)
Monocytes Absolute: 0.2 10*3/uL (ref 0.1–1.0)
Monocytes Relative: 8 %
Neutro Abs: 0.9 10*3/uL — ABNORMAL LOW (ref 1.7–7.7)
Neutrophils Relative %: 43 %
Platelets: 206 10*3/uL (ref 150–400)
RBC: 3.52 MIL/uL — ABNORMAL LOW (ref 3.87–5.11)
RDW: 15.1 % (ref 11.5–15.5)
WBC: 2.1 10*3/uL — ABNORMAL LOW (ref 4.0–10.5)
nRBC: 0 % (ref 0.0–0.2)

## 2018-11-15 LAB — COMPREHENSIVE METABOLIC PANEL
ALT: 24 U/L (ref 0–44)
AST: 25 U/L (ref 15–41)
Albumin: 4.3 g/dL (ref 3.5–5.0)
Alkaline Phosphatase: 57 U/L (ref 38–126)
Anion gap: 9 (ref 5–15)
BUN: 13 mg/dL (ref 8–23)
CO2: 26 mmol/L (ref 22–32)
Calcium: 10 mg/dL (ref 8.9–10.3)
Chloride: 104 mmol/L (ref 98–111)
Creatinine, Ser: 0.96 mg/dL (ref 0.44–1.00)
GFR calc Af Amer: >60 mL/min (ref >60)
GFR calc non Af Amer: 58 mL/min — ABNORMAL LOW (ref >60)
Glucose, Bld: 119 mg/dL — ABNORMAL HIGH (ref 70–99)
Potassium: 4 mmol/L (ref 3.5–5.1)
Sodium: 139 mmol/L (ref 135–145)
Total Bilirubin: 0.9 mg/dL (ref 0.3–1.2)
Total Protein: 7.5 g/dL (ref 6.5–8.1)

## 2018-11-15 MED ORDER — METRONIDAZOLE 500 MG PO TABS: 500.0000 mg | ORAL_TABLET | Freq: Three times a day (TID) | ORAL | 0 refills | Status: DC

## 2018-11-15 MED ORDER — CIPROFLOXACIN HCL 500 MG PO TABS: 500.0000 mg | ORAL_TABLET | Freq: Two times a day (BID) | ORAL | 0 refills | Status: DC

## 2018-11-15 NOTE — Progress Notes (Signed)
Patient is here for follow up, she is doing well no complaints.  

## 2018-11-15 NOTE — Telephone Encounter (Signed)
Called pt. And let her know that on her ct scan there was incidental report of mild diverticulitis in the bowels.  Dr. Janese Banks had said that since she was not symptomatic upon exam in office that most of the time they would not do anything but she was concerned with [place] count to be low and your body might not be able to fight off this and she may get symptoms. The patient states that she was eating a lot of salads and nuts in them and she started having stomach gripe her and she decided to stop eating salads and it got better and stopped bothering her. I told her that Dr. Janese Banks called in to atb to take and if she can get them in am and start taking them. Flagyl is tid, and cipro is bid and call if she has any questions or side effects from treatment. When she comes back next week to recheck the anc counts and it is up she may want her to stop the atb. Patient is agreeable to the plan and get them in am

## 2018-11-16 LAB — CANCER ANTIGEN 27.29: CA 27.29: 85.7 U/mL — ABNORMAL HIGH (ref 0.0–38.6)

## 2018-11-16 MED FILL — IBRANCE 75 MG TABS: 75 | 28 days supply | Qty: 21 | Fill #1

## 2018-11-17 NOTE — Progress Notes (Signed)
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 Patient is a 78 year old female with no significant comorbidities who noticed left breast mass about 6-9 months ago. She thought it would go awaybut it continued to increase in size and began to involve her skin. She also noted tenderness to palpation as well as intermittent sharp tingling pain. She was seen by Dr. Adora Fridge on 08/25/2016 and patient had a biopsy of her skin in his office which showed:DIAGNOSIS:  A. LEFT BREAST SKIN; EXCISION:  - INVASIVE CARCINOMA MORPHOLOGICALLY CONSISTENT WITH MAMMARY ORIGIN  INVOLVING THE DERMIS.   BREAST BIOMARKER TESTS  Estrogen Receptor (ER) Status: POSITIVE, >90%  Progesterone Receptor (PgR) Status: POSITIVE, >70%  Her2 negative  2. Patient underwent bilateral diagnostic mammogram on 08/31/2016 showed:IMPRESSION: 1. Known left breast cancer, retroareolar with probable extension to the nipple, measuring 4.3 cm greatest dimension by ultrasound, with associated architectural distortion and associated left nipple retraction, and with associated diffuse skin thickening on the left. 2. No enlarged or morphologically abnormal lymph nodes are seen in the left axilla by ultrasound. 3. No evidence of malignancy within the right breast.  3. Patient was also complaining of some perirectal pain and underwent CT abdomen pelvis with contrast on 09/02/2016 which showed: IMPRESSION: Left colonic diverticulosis. No active diverticulitis.Fatty liver. No  acute findings or evidence of metastatic disease in the abdomen or pelvis. Diffuse bony sclerotic metastases.  4. Other than that her breast pain patient overall feels well. Denies any unintentional weight loss. She continues to be active and care for her great-grandchildren. She has had a hysterectomy in the past. No family history of breast or ovarian cancer.  5. Given that she had inflammatory breast cancer- plan was to give 4 cycles of dose dense AC followed by possible mastectomy and treat bone mets with AI + ibrance  6. PET CT scan on 09/11/16 showed: IMPRESSION: 1. Low-grade but abnormal hypermetabolic activity associated with the cutaneous and sub areolar glandular tissues of the left breast, compatible with malignancy, representative SUV 3.9 compared to contralateral normal side 1.4. 2. Diffuse sclerotic osseous metastatic disease with low-grade hypermetabolic activity, a representative SUV along the left sacrum 5.4. 3. Other imaging findings of potential clinical significance: Aortic Atherosclerosis (ICD10-I70.0). Descending and sigmoid colon Diverticulosis.  7. Baseline MUGA scan showed EF of 71%. Baseline CA 27.29 elevated at 563.2  8. 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  9. Letrozole and ibrance started in sept 2018. zometa monthly also started. Baseline bone density scan normal  10. ibrance held in oct 2018 after cycle 1 due to Grade 3 neutropenia and thrombocytopenia.cycle 2 restaretd on 02/04/17 at 100 mg. Patient continued to have prolongeed grade 3 neutropenia and dose lowered to 75 mg  11. Patient had extensive RLE DVT in April 2019 and is currently on eliquis. She also underwent venous thrombectomy and thrombolytic therapy and IVC placement by Dr. Lucky Cowboy.She also has peripheral vascular disease secondary to atherosclerosis and underwent stenting with vascular surgery as well    Interval history- she feels well. Denies any complaints at  this time.   ECOG PS- 1 Pain scale- 0   Review of systems- Review of Systems  Constitutional: Negative for chills, fever, malaise/fatigue and weight loss.  HENT: Negative for congestion, ear discharge and nosebleeds.   Eyes: Negative for blurred vision.  Respiratory: Negative for cough, hemoptysis, sputum production, shortness of breath and wheezing.   Cardiovascular: Negative for chest pain, palpitations, orthopnea and claudication.  Gastrointestinal: Negative for abdominal pain, blood in stool, constipation, diarrhea, heartburn, melena, nausea and vomiting.  Genitourinary: Negative for dysuria, flank pain, frequency, hematuria and urgency.  Musculoskeletal: Negative for back pain, joint pain and myalgias.  Skin: Negative for rash.  Neurological: Negative for dizziness, tingling, focal weakness, seizures, weakness and headaches.  Endo/Heme/Allergies: Does not bruise/bleed easily.  Psychiatric/Behavioral: Negative for depression and suicidal ideas. The patient does not have insomnia.        No Known Allergies   Past Medical History:  Diagnosis Date   Breast cancer ([place])    Cancer ([place])    left breast   DVT (deep venous thrombosis) ([place])    2019   Hypertension    Port catheter in place 09/14/2016   Placed 09/10/2016 RT chest.     Past Surgical History:  Procedure Laterality Date   ABDOMINAL HYSTERECTOMY     APPENDECTOMY     BREAST BIOPSY     CYST EXCISION Right 09/2016   Buttocks   IVC FILTER REMOVAL N/A 01/17/2018   Procedure: IVC FILTER REMOVAL;  Surgeon: Algernon Huxley, MD;  Location: [place] [facility];  Service: Cardiovascular;  Laterality: N/A;   LOWER EXTREMITY ANGIOGRAPHY Right 11/11/2017   Procedure: LOWER EXTREMITY ANGIOGRAPHY;  Surgeon: Katha Cabal, MD;  Location: [place] [facility];  Service: Cardiovascular;  Laterality: Right;   PERIPHERAL VASCULAR THROMBECTOMY Right 08/30/2017   Procedure: PERIPHERAL VASCULAR THROMBECTOMY;  Surgeon: Algernon Huxley, MD;  Location: [place] [facility];  Service: Cardiovascular;  Laterality: Right;   PORTACATH PLACEMENT Right 09/10/2016   Procedure: INSERTION PORT-A-CATH;  Surgeon: Jules Husbands, MD;  Location: [facility];  Service: General;  Laterality: Right;    Social History   Socioeconomic History   Marital status: Married    Spouse name: Jeneen Rinks   Number of children: 2   Years of education: 6   Highest education level: Bachelor's degree (e.g., BA, AB, BS)  Occupational History   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[employer] resource strain: 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   Food insecurity    Worry: Never true    Inability: Never true   Transportation needs    Medical: No    Non-medical: No  Tobacco Use   Smoking status: Never Smoker   Smokeless tobacco: Never Used  Substance and Sexual Activity   Alcohol use: No   Drug use: No   Sexual activity: Never  Lifestyle   Physical activity    Days per week: Patient refused    Minutes per session: Patient refused   Stress: Not at all  Relationships   Social connections    Talks on phone: More than three times a week    Gets together: Once a week    Attends religious service: More than 4 times per year    Active member of club or organization: No    Attends meetings of clubs or organizations: Never    Relationship status: Not on file   Intimate partner violence    Fear  of current or ex partner: Not on file    Emotionally abused: Not on file    Physically abused: Not on file    Forced sexual activity: Not on file  Other Topics Concern   Not on file  Social History Narrative   Not on file    Family History  Problem Relation Age of Onset   Parkinson's disease Mother    Bladder Cancer Father    Lung cancer Father      Current Outpatient Medications:    acetaminophen (TYLENOL) 500 MG tablet, Take 500 mg by mouth daily as needed for moderate pain or headache. , Disp:  , Rfl:    amLODipine (NORVASC) 10 MG tablet, Take 10 mg by mouth daily., Disp: , Rfl:    aspirin EC 81 MG tablet, Take 1 tablet (81 mg total) by mouth daily., Disp: 150 tablet, Rfl: 2   Calcium Carb-Cholecalciferol (CALCIUM 600/VITAMIN D3 PO), Take 1 tablet by mouth daily., Disp: , Rfl:    Cholecalciferol (VITAMIN D3) 10 MCG (400 UNIT) CAPS, Take 1 capsule by mouth daily., Disp: , Rfl:    ibuprofen (ADVIL,MOTRIN) 400 MG tablet, Take 400 mg by mouth 3 (three) times daily as needed., Disp: , Rfl:    letrozole (FEMARA) 2.5 MG tablet, Take 1 tablet (2.5 mg total) by mouth daily., Disp: 90 tablet, Rfl: 3   lidocaine-prilocaine (EMLA) cream, Apply 1 application topically as needed (port access)., Disp: 30 g, Rfl: 2   palbociclib (IBRANCE) 75 MG tablet, Take 1 tablet (75 mg total) by mouth daily. Take for 21 days on, 7 days off, repeat every 28 days., Disp: 21 tablet, Rfl: 3   PREVIDENT 5000 BOOSTER PLUS 1.1 % PSTE, Place 1 application onto teeth 2 (two) times daily. , Disp: , Rfl:    prochlorperazine (COMPAZINE) 10 MG tablet, Take 1 tablet (10 mg total) by mouth every 6 (six) hours as needed (Nausea or vomiting)., Disp: 30 tablet, Rfl: 2   apixaban (ELIQUIS) 5 MG TABS tablet, Take 1 tablet (5 mg total) by mouth 2 (two) times daily for 30 days., Disp: 60 tablet, Rfl: 6   ciprofloxacin (CIPRO) 500 MG tablet, Take 1 tablet (500 mg total) by mouth 2 (two) times daily., Disp: 20 tablet, Rfl: 0   metroNIDAZOLE (FLAGYL) 500 MG tablet, Take 1 tablet (500 mg total) by mouth 3 (three) times daily., Disp: 30 tablet, Rfl: 0  Physical exam:  Vitals:   11/15/18 1113  BP: (!) 141/78  Pulse: 74  Resp: 18  Temp: 97.6 F (36.4 C)  TempSrc: Oral  SpO2: 99%  Weight: 202 lb 1.6 oz (91.7 kg)   Physical Exam HENT:     Head: Normocephalic and atraumatic.  Eyes:     Pupils: Pupils are equal, round, and reactive to light.  Neck:     Musculoskeletal: Normal range of motion.  Cardiovascular:     Rate  and Rhythm: Normal rate and regular rhythm.     Heart sounds: Normal heart sounds.  Pulmonary:     Effort: Pulmonary effort is normal.     Breath sounds: Normal breath sounds.  Abdominal:     General: Bowel sounds are normal.     Palpations: Abdomen is soft.  Skin:    General: Skin is warm and dry.  Neurological:     Mental Status: She is alert and oriented to person, place, and time.      CMP Latest Ref Rng & Units 11/15/2018  Glucose 70 - 99 mg/dL  119(H)  BUN 8 - 23 mg/dL 13  Creatinine 0.44 - 1.00 mg/dL 0.96  Sodium 135 - 145 mmol/L 139  Potassium 3.5 - 5.1 mmol/L 4.0  Chloride 98 - 111 mmol/L 104  CO2 22 - 32 mmol/L 26  Calcium 8.9 - 10.3 mg/dL 10.0  Total Protein 6.5 - 8.1 g/dL 7.5  Total Bilirubin 0.3 - 1.2 mg/dL 0.9  Alkaline Phos 38 - 126 U/L 57  AST 15 - 41 U/L 25  ALT 0 - 44 U/L 24   CBC Latest Ref Rng & Units 11/15/2018  WBC 4.0 - 10.5 K/uL 2.1(L)  Hemoglobin 12.0 - 15.0 g/dL 12.7  Hematocrit 36.0 - 46.0 % 36.1  Platelets 150 - 400 K/uL 206    No images are attached to the encounter.  Ct Chest W Contrast  Result Date: 11/11/2018 CLINICAL DATA:  Chemotherapy ongoing. Solitary breast cancer. LEFT breast carcinoma. Skeletal metastasis EXAM: CT CHEST, ABDOMEN, AND PELVIS WITH CONTRAST TECHNIQUE: Multidetector CT imaging of the chest, abdomen and pelvis was performed following the standard protocol during bolus administration of intravenous contrast. CONTRAST:  148m OMNIPAQUE IOHEXOL 300 MG/ML  SOLN COMPARISON:  CT 07/22/2018 FINDINGS: CT CHEST FINDINGS Cardiovascular: Coronary artery calcification and aortic atherosclerotic calcification. Port in the anterior chest wall with tip in distal SVC. Mediastinum/Nodes: No axillary supraclavicular adenopathy. No mediastinal hilar adenopathy. No pericardial effusion. Lungs/Pleura: Lungs are clear. Small nodule along the RIGHT oblique fissure superiorly in the RIGHT lower lobe (image 56/3) appears calcified and therefore benign.  Musculoskeletal: Diffuse sclerotic metastasis. CT ABDOMEN AND PELVIS FINDINGS Hepatobiliary: No focal hepatic lesion. No biliary ductal dilatation. Gallbladder is normal. Common bile duct is normal. Pancreas: Pancreas is normal. No ductal dilatation. No pancreatic inflammation. Spleen: Normal spleen Adrenals/urinary tract: Adrenal glands and kidneys are normal. The ureters and bladder normal. Stomach/Bowel: Stomach, small bowel, appendix, and cecum are normal. Multiple diverticula of the descending colon and sigmoid colon. Very mild pericolonic inflammation of the proximal sigmoid colon (seen on image 97/2 and sagittal image 126/6). Vascular/Lymphatic: Abdominal aorta is normal caliber with atherosclerotic calcification. There is no retroperitoneal or periportal lymphadenopathy. No pelvic lymphadenopathy. Reproductive: Post hysterectomy Other: No peritoneal metastasis or omental metastasis. Musculoskeletal: Diffuse sclerotic skeletal metastasis IMPRESSION: Chest Impression: 1. No metastatic adenopathy. 2. No pulmonary metastasis. 3. Diffuse skeletal sclerotic metastasis Abdomen / Pelvis Impression: 1. No evidence soft tissue metastasis in the abdomen pelvis. 2. Diffuse sclerotic skeletal metastasis. 3. Mild inflammation involving the proximal sigmoid colon concerning for MILD ACUTE DIVERTICULITIS. These results will be called to the ordering clinician or representative by the Radiologist Assistant, and communication documented in the PACS or zVision Dashboard. Electronically Signed   By: SSuzy BouchardM.D.   On: 11/11/2018 12:01   Ct Abdomen Pelvis W Contrast  Result Date: 11/11/2018 CLINICAL DATA:  Chemotherapy ongoing. Solitary breast cancer. LEFT breast carcinoma. Skeletal metastasis EXAM: CT CHEST, ABDOMEN, AND PELVIS WITH CONTRAST TECHNIQUE: Multidetector CT imaging of the chest, abdomen and pelvis was performed following the standard protocol during bolus administration of intravenous contrast.  CONTRAST:  1092mOMNIPAQUE IOHEXOL 300 MG/ML  SOLN COMPARISON:  CT 07/22/2018 FINDINGS: CT CHEST FINDINGS Cardiovascular: Coronary artery calcification and aortic atherosclerotic calcification. Port in the anterior chest wall with tip in distal SVC. Mediastinum/Nodes: No axillary supraclavicular adenopathy. No mediastinal hilar adenopathy. No pericardial effusion. Lungs/Pleura: Lungs are clear. Small nodule along the RIGHT oblique fissure superiorly in the RIGHT lower lobe (image 56/3) appears calcified and therefore benign. Musculoskeletal: Diffuse sclerotic metastasis. CT ABDOMEN  AND PELVIS FINDINGS Hepatobiliary: No focal hepatic lesion. No biliary ductal dilatation. Gallbladder is normal. Common bile duct is normal. Pancreas: Pancreas is normal. No ductal dilatation. No pancreatic inflammation. Spleen: Normal spleen Adrenals/urinary tract: Adrenal glands and kidneys are normal. The ureters and bladder normal. Stomach/Bowel: Stomach, small bowel, appendix, and cecum are normal. Multiple diverticula of the descending colon and sigmoid colon. Very mild pericolonic inflammation of the proximal sigmoid colon (seen on image 97/2 and sagittal image 126/6). Vascular/Lymphatic: Abdominal aorta is normal caliber with atherosclerotic calcification. There is no retroperitoneal or periportal lymphadenopathy. No pelvic lymphadenopathy. Reproductive: Post hysterectomy Other: No peritoneal metastasis or omental metastasis. Musculoskeletal: Diffuse sclerotic skeletal metastasis IMPRESSION: Chest Impression: 1. No metastatic adenopathy. 2. No pulmonary metastasis. 3. Diffuse skeletal sclerotic metastasis Abdomen / Pelvis Impression: 1. No evidence soft tissue metastasis in the abdomen pelvis. 2. Diffuse sclerotic skeletal metastasis. 3. Mild inflammation involving the proximal sigmoid colon concerning for MILD ACUTE DIVERTICULITIS. These results will be called to the ordering clinician or representative by the Radiologist  Assistant, and communication documented in the PACS or zVision Dashboard. Electronically Signed   By: Suzy Bouchard M.D.   On: 11/11/2018 12:01     Assessment and plan- Patient is a 78 y.o. female with metastatic breast cancer and bone only metastases currently on letrozole and Ibrance.  She is here to discuss the results of her CT scan routine follow-up of breast cancer  I have reviewed CT chest abdomen and pelvis images independently and discussed findings with the patient.  She has stable chronic extensive bone metastases.  No new metastatic disease.  Patient will continue to take letrozole and Ibrance.  However her [place] today is less than 1.  Therefore she will not start taking her Ibrance in 3 days.  Hold for 1 week.  Repeat CBC with differential in 1 week's time and if her [place] is greater than 1 she can restart Ibrance at 75 mg at that time.  Tumor markers have been stable around 80.    CT abdomen incidentally showed mild acute diverticulitis.  She does not have any symptoms of nausea vomiting abdominal pain presently.  Her bowel movements are regular.  No clinical signs and symptoms of diverticulitis.  However given that she has neutropenia I will cover her with empiric antibiotics ciprofloxacin and Flagyl for 10 days.  Patient will receive Zometa next month.  I will see her back in 8 weeks with CBC with differential, CMP and CA-27-29    Visit Diagnosis 1. Bone metastases ([place])   2. Malignant neoplasm of left breast in female, estrogen receptor positive, unspecified site of breast ([place])   3. Diverticulitis   4. High risk medication use   5. Drug-induced neutropenia ([place])      Dr. Randa Evens, MD, MPH [facility] [phone] 11/17/2018 12:16 PM

## 2018-11-23 ENCOUNTER — Other Ambulatory Visit: Payer: Self-pay

## 2018-11-24 ENCOUNTER — Inpatient Hospital Stay: Payer: Medicare Other

## 2018-11-24 ENCOUNTER — Telehealth: Payer: Self-pay | Admitting: *Deleted

## 2018-11-24 ENCOUNTER — Other Ambulatory Visit: Payer: Self-pay

## 2018-11-24 DIAGNOSIS — R748 Abnormal levels of other serum enzymes: Secondary | ICD-10-CM

## 2018-11-24 DIAGNOSIS — C50912 Malignant neoplasm of unspecified site of left female breast: Secondary | ICD-10-CM | POA: Diagnosis not present

## 2018-11-24 DIAGNOSIS — Z79899 Other long term (current) drug therapy: Secondary | ICD-10-CM

## 2018-11-24 LAB — CBC WITH DIFFERENTIAL/PLATELET
Abs Immature Granulocytes: 0.05 10*3/uL (ref 0.00–0.07)
Basophils Absolute: 0.1 10*3/uL (ref 0.0–0.1)
Basophils Relative: 3 %
Eosinophils Absolute: 0.2 10*3/uL (ref 0.0–0.5)
Eosinophils Relative: 4 %
HCT: 37 % (ref 36.0–46.0)
Hemoglobin: 13 g/dL (ref 12.0–15.0)
Immature Granulocytes: 1 %
Lymphocytes Relative: 31 %
Lymphs Abs: 1.1 10*3/uL (ref 0.7–4.0)
MCH: 36.2 pg — ABNORMAL HIGH (ref 26.0–34.0)
MCHC: 35.1 g/dL (ref 30.0–36.0)
MCV: 103.1 fL — ABNORMAL HIGH (ref 80.0–100.0)
Monocytes Absolute: 0.4 10*3/uL (ref 0.1–1.0)
Monocytes Relative: 12 %
Neutro Abs: 1.7 10*3/uL (ref 1.7–7.7)
Neutrophils Relative %: 49 %
Platelets: 242 10*3/uL (ref 150–400)
RBC: 3.59 MIL/uL — ABNORMAL LOW (ref 3.87–5.11)
RDW: 15.7 % — ABNORMAL HIGH (ref 11.5–15.5)
WBC: 3.6 10*3/uL — ABNORMAL LOW (ref 4.0–10.5)
nRBC: 0.6 % — ABNORMAL HIGH (ref 0.0–0.2)

## 2018-11-24 LAB — COMPREHENSIVE METABOLIC PANEL
ALT: 82 U/L — ABNORMAL HIGH (ref 0–44)
AST: 98 U/L — ABNORMAL HIGH (ref 15–41)
Albumin: 4.3 g/dL (ref 3.5–5.0)
Alkaline Phosphatase: 57 U/L (ref 38–126)
Anion gap: 10 (ref 5–15)
BUN: 10 mg/dL (ref 8–23)
CO2: 22 mmol/L (ref 22–32)
Calcium: 9.4 mg/dL (ref 8.9–10.3)
Chloride: 106 mmol/L (ref 98–111)
Creatinine, Ser: 0.76 mg/dL (ref 0.44–1.00)
GFR calc Af Amer: >60 mL/min (ref >60)
GFR calc non Af Amer: >60 mL/min (ref >60)
Glucose, Bld: 124 mg/dL — ABNORMAL HIGH (ref 70–99)
Potassium: 3.8 mmol/L (ref 3.5–5.1)
Sodium: 138 mmol/L (ref 135–145)
Total Bilirubin: 0.9 mg/dL (ref 0.3–1.2)
Total Protein: 7.3 g/dL (ref 6.5–8.1)

## 2018-11-24 NOTE — Telephone Encounter (Signed)
Called pt to let her know that she can start back on Ibrance sat. Her wbc and anc came up. She asked about staying on atb and I spoke to [place] and she said she can come off. Also her liver enzymes were elevated and Dr. Janese Banks thinks it may be from the atb so she wants her to come back in for lab only in 10 days and see what the numbers are. Pt. Is agreeable and will com ein 8/25 at 11 am.

## 2018-12-05 ENCOUNTER — Other Ambulatory Visit: Payer: Self-pay

## 2018-12-06 ENCOUNTER — Other Ambulatory Visit: Payer: Self-pay

## 2018-12-06 ENCOUNTER — Inpatient Hospital Stay: Payer: Medicare Other

## 2018-12-06 DIAGNOSIS — C50912 Malignant neoplasm of unspecified site of left female breast: Secondary | ICD-10-CM | POA: Diagnosis not present

## 2018-12-06 DIAGNOSIS — R748 Abnormal levels of other serum enzymes: Secondary | ICD-10-CM

## 2018-12-06 LAB — COMPREHENSIVE METABOLIC PANEL
ALT: 28 U/L (ref 0–44)
AST: 26 U/L (ref 15–41)
Albumin: 4.1 g/dL (ref 3.5–5.0)
Alkaline Phosphatase: 55 U/L (ref 38–126)
Anion gap: 7 (ref 5–15)
BUN: 11 mg/dL (ref 8–23)
CO2: 27 mmol/L (ref 22–32)
Calcium: 9.8 mg/dL (ref 8.9–10.3)
Chloride: 105 mmol/L (ref 98–111)
Creatinine, Ser: 0.91 mg/dL (ref 0.44–1.00)
GFR calc Af Amer: >60 mL/min (ref >60)
GFR calc non Af Amer: >60 mL/min (ref >60)
Glucose, Bld: 128 mg/dL — ABNORMAL HIGH (ref 70–99)
Potassium: 4.4 mmol/L (ref 3.5–5.1)
Sodium: 139 mmol/L (ref 135–145)
Total Bilirubin: 0.9 mg/dL (ref 0.3–1.2)
Total Protein: 7.1 g/dL (ref 6.5–8.1)

## 2018-12-08 ENCOUNTER — Telehealth: Payer: Self-pay | Admitting: *Deleted

## 2018-12-08 NOTE — Telephone Encounter (Signed)
Pt called to get her labs results from  Tues. I went over with her and the WBC is 3.5 which is better and her ANC is normal. These numbers wer much better than the last 2 times. She also asked about in October she is going to PCP and she will need flu shot, TDAP, and prevnar. She wanted to know if that is ok. I checked with Janese Banks and she said it is ok to have them. I told pt it is ok.

## 2018-12-09 ENCOUNTER — Other Ambulatory Visit: Payer: Medicare Other

## 2018-12-16 ENCOUNTER — Other Ambulatory Visit: Payer: Self-pay

## 2018-12-18 ENCOUNTER — Other Ambulatory Visit: Payer: Self-pay | Admitting: Oncology

## 2018-12-18 DIAGNOSIS — C50912 Malignant neoplasm of unspecified site of left female breast: Secondary | ICD-10-CM

## 2018-12-20 ENCOUNTER — Inpatient Hospital Stay: Payer: Medicare Other | Attending: Oncology

## 2018-12-20 ENCOUNTER — Other Ambulatory Visit: Payer: Self-pay

## 2018-12-20 ENCOUNTER — Other Ambulatory Visit: Payer: Self-pay | Admitting: Oncology

## 2018-12-20 ENCOUNTER — Inpatient Hospital Stay: Payer: Medicare Other

## 2018-12-20 VITALS — BP 160/76 | HR 67 | Resp 18

## 2018-12-20 DIAGNOSIS — C50912 Malignant neoplasm of unspecified site of left female breast: Secondary | ICD-10-CM | POA: Diagnosis present

## 2018-12-20 DIAGNOSIS — Z79899 Other long term (current) drug therapy: Secondary | ICD-10-CM

## 2018-12-20 DIAGNOSIS — Z170 Estrogen receptor positive status [ER+]: Secondary | ICD-10-CM | POA: Diagnosis not present

## 2018-12-20 DIAGNOSIS — C7951 Secondary malignant neoplasm of bone: Secondary | ICD-10-CM

## 2018-12-20 LAB — CBC WITH DIFFERENTIAL/PLATELET
Abs Immature Granulocytes: 0.01 10*3/uL (ref 0.00–0.07)
Basophils Absolute: 0 10*3/uL (ref 0.0–0.1)
Basophils Relative: 1 %
Eosinophils Absolute: 0.1 10*3/uL (ref 0.0–0.5)
Eosinophils Relative: 4 %
HCT: 37.5 % (ref 36.0–46.0)
Hemoglobin: 13.1 g/dL (ref 12.0–15.0)
Immature Granulocytes: 0 %
Lymphocytes Relative: 42 %
Lymphs Abs: 1.1 10*3/uL (ref 0.7–4.0)
MCH: 35.8 pg — ABNORMAL HIGH (ref 26.0–34.0)
MCHC: 34.9 g/dL (ref 30.0–36.0)
MCV: 102.5 fL — ABNORMAL HIGH (ref 80.0–100.0)
Monocytes Absolute: 0.2 10*3/uL (ref 0.1–1.0)
Monocytes Relative: 8 %
Neutro Abs: 1.2 10*3/uL — ABNORMAL LOW (ref 1.7–7.7)
Neutrophils Relative %: 45 %
Platelets: 190 10*3/uL (ref 150–400)
RBC: 3.66 MIL/uL — ABNORMAL LOW (ref 3.87–5.11)
RDW: 14.5 % (ref 11.5–15.5)
WBC: 2.7 10*3/uL — ABNORMAL LOW (ref 4.0–10.5)
nRBC: 0 % (ref 0.0–0.2)

## 2018-12-20 LAB — COMPREHENSIVE METABOLIC PANEL
ALT: 28 U/L (ref 0–44)
AST: 27 U/L (ref 15–41)
Albumin: 4.4 g/dL (ref 3.5–5.0)
Alkaline Phosphatase: 59 U/L (ref 38–126)
Anion gap: 9 (ref 5–15)
BUN: 15 mg/dL (ref 8–23)
CO2: 25 mmol/L (ref 22–32)
Calcium: 9.9 mg/dL (ref 8.9–10.3)
Chloride: 104 mmol/L (ref 98–111)
Creatinine, Ser: 0.83 mg/dL (ref 0.44–1.00)
GFR calc Af Amer: >60 mL/min (ref >60)
GFR calc non Af Amer: >60 mL/min (ref >60)
Glucose, Bld: 122 mg/dL — ABNORMAL HIGH (ref 70–99)
Potassium: 4 mmol/L (ref 3.5–5.1)
Sodium: 138 mmol/L (ref 135–145)
Total Bilirubin: 1.1 mg/dL (ref 0.3–1.2)
Total Protein: 7.3 g/dL (ref 6.5–8.1)

## 2018-12-20 MED ORDER — SODIUM CHLORIDE 0.9% FLUSH
10.0000 mL | Freq: Once | INTRAVENOUS | Status: AC
  Administered 2018-12-20: 10 mL via INTRAVENOUS
  Filled 2018-12-20: qty 10

## 2018-12-20 MED ORDER — ZOLEDRONIC ACID 4 MG/100ML IV SOLN
4.0000 mg | Freq: Once | INTRAVENOUS | Status: AC
  Administered 2018-12-20: 4 mg via INTRAVENOUS
  Filled 2018-12-20: qty 100

## 2018-12-20 MED ORDER — HEPARIN SOD (PORK) LOCK FLUSH 100 UNIT/ML IV SOLN
500.0000 [IU] | Freq: Once | INTRAVENOUS | Status: AC
  Administered 2018-12-20: 500 [IU] via INTRAVENOUS
  Filled 2018-12-20: qty 5

## 2018-12-20 MED ORDER — SODIUM CHLORIDE 0.9 % IV SOLN
INTRAVENOUS | Status: DC
  Administered 2018-12-20: 11:00:00 via INTRAVENOUS
  Filled 2018-12-20: qty 250

## 2018-12-20 MED FILL — IBRANCE 75 MG TABS: 75 | 28 days supply | Qty: 21 | Fill #2

## 2018-12-26 ENCOUNTER — Telehealth: Payer: Self-pay | Admitting: *Deleted

## 2018-12-26 MED ORDER — APIXABAN 5 MG PO TABS: 5.0000 mg | ORAL_TABLET | Freq: Two times a day (BID) | ORAL | 6 refills | Status: DC

## 2018-12-26 NOTE — Telephone Encounter (Signed)
Called pt to let her know that I sent in a refill of eliquis to her pharmacy.  She was thankful

## 2018-12-28 ENCOUNTER — Telehealth: Payer: Self-pay | Admitting: Pharmacist

## 2018-12-28 NOTE — Telephone Encounter (Signed)
Oral Chemotherapy Pharmacist Encounter  Follow-Up Form  Called patient today to follow up regarding patient's oral chemotherapy medication: Ibrance (palbociclib)  Original Start date of oral chemotherapy: 12/2016   Pt reports 0 tablets/doses of Ibrance missed so far this cycle.    Pt reports the following side effects: none reported  Recent labs reviewed: CBC from 12/20/2018 reviewed  New medications?: none reported  Other Issues: none reported  Patient knows to call the office with questions or concerns. Oral Oncology Clinic will continue to follow.  Darl Pikes, PharmD, BCPS, [facility] Hematology/Oncology Clinical Pharmacist [facility] [phone]  12/28/2018 3:54 PM

## 2019-01-16 ENCOUNTER — Other Ambulatory Visit: Payer: Self-pay

## 2019-01-16 ENCOUNTER — Encounter: Payer: Self-pay | Admitting: Oncology

## 2019-01-16 MED FILL — IBRANCE 75 MG TABS: 75 | 28 days supply | Qty: 21 | Fill #3

## 2019-01-16 NOTE — Progress Notes (Signed)
Patient stated that she had been doing well with no complaints. 

## 2019-01-17 ENCOUNTER — Other Ambulatory Visit: Payer: Self-pay | Admitting: *Deleted

## 2019-01-17 ENCOUNTER — Inpatient Hospital Stay: Payer: Medicare Other | Attending: Oncology | Admitting: Oncology

## 2019-01-17 ENCOUNTER — Other Ambulatory Visit: Payer: Self-pay

## 2019-01-17 ENCOUNTER — Inpatient Hospital Stay: Payer: Medicare Other | Admitting: *Deleted

## 2019-01-17 VITALS — BP 162/79 | HR 62 | Temp 97.6°F | Resp 16 | Wt 203.1 lb

## 2019-01-17 DIAGNOSIS — Z79811 Long term (current) use of aromatase inhibitors: Secondary | ICD-10-CM | POA: Diagnosis not present

## 2019-01-17 DIAGNOSIS — Z170 Estrogen receptor positive status [ER+]: Secondary | ICD-10-CM | POA: Diagnosis not present

## 2019-01-17 DIAGNOSIS — C50912 Malignant neoplasm of unspecified site of left female breast: Secondary | ICD-10-CM

## 2019-01-17 DIAGNOSIS — Z7901 Long term (current) use of anticoagulants: Secondary | ICD-10-CM | POA: Insufficient documentation

## 2019-01-17 DIAGNOSIS — I10 Essential (primary) hypertension: Secondary | ICD-10-CM | POA: Insufficient documentation

## 2019-01-17 DIAGNOSIS — D702 Other drug-induced agranulocytosis: Secondary | ICD-10-CM | POA: Diagnosis not present

## 2019-01-17 DIAGNOSIS — Z79899 Other long term (current) drug therapy: Secondary | ICD-10-CM | POA: Diagnosis not present

## 2019-01-17 DIAGNOSIS — C7951 Secondary malignant neoplasm of bone: Secondary | ICD-10-CM | POA: Diagnosis not present

## 2019-01-17 DIAGNOSIS — Z86718 Personal history of other venous thrombosis and embolism: Secondary | ICD-10-CM | POA: Insufficient documentation

## 2019-01-17 DIAGNOSIS — Z95828 Presence of other vascular implants and grafts: Secondary | ICD-10-CM

## 2019-01-17 LAB — COMPREHENSIVE METABOLIC PANEL
ALT: 30 U/L (ref 0–44)
AST: 30 U/L (ref 15–41)
Albumin: 4.1 g/dL (ref 3.5–5.0)
Alkaline Phosphatase: 58 U/L (ref 38–126)
Anion gap: 8 (ref 5–15)
BUN: 11 mg/dL (ref 8–23)
CO2: 23 mmol/L (ref 22–32)
Calcium: 9.5 mg/dL (ref 8.9–10.3)
Chloride: 106 mmol/L (ref 98–111)
Creatinine, Ser: 0.79 mg/dL (ref 0.44–1.00)
GFR calc Af Amer: >60 mL/min (ref >60)
GFR calc non Af Amer: >60 mL/min (ref >60)
Glucose, Bld: 122 mg/dL — ABNORMAL HIGH (ref 70–99)
Potassium: 3.7 mmol/L (ref 3.5–5.1)
Sodium: 137 mmol/L (ref 135–145)
Total Bilirubin: 1.2 mg/dL (ref 0.3–1.2)
Total Protein: 7.2 g/dL (ref 6.5–8.1)

## 2019-01-17 LAB — CBC WITH DIFFERENTIAL/PLATELET
Abs Immature Granulocytes: 0.01 10*3/uL (ref 0.00–0.07)
Basophils Absolute: 0.1 10*3/uL (ref 0.0–0.1)
Basophils Relative: 2 %
Eosinophils Absolute: 0.1 10*3/uL (ref 0.0–0.5)
Eosinophils Relative: 2 %
HCT: 34.8 % — ABNORMAL LOW (ref 36.0–46.0)
Hemoglobin: 12.4 g/dL (ref 12.0–15.0)
Immature Granulocytes: 0 %
Lymphocytes Relative: 37 %
Lymphs Abs: 0.9 10*3/uL (ref 0.7–4.0)
MCH: 36.6 pg — ABNORMAL HIGH (ref 26.0–34.0)
MCHC: 35.6 g/dL (ref 30.0–36.0)
MCV: 102.7 fL — ABNORMAL HIGH (ref 80.0–100.0)
Monocytes Absolute: 0.2 10*3/uL (ref 0.1–1.0)
Monocytes Relative: 9 %
Neutro Abs: 1.2 10*3/uL — ABNORMAL LOW (ref 1.7–7.7)
Neutrophils Relative %: 50 %
Platelets: 189 10*3/uL (ref 150–400)
RBC: 3.39 MIL/uL — ABNORMAL LOW (ref 3.87–5.11)
RDW: 14.9 % (ref 11.5–15.5)
WBC: 2.5 10*3/uL — ABNORMAL LOW (ref 4.0–10.5)
nRBC: 0 % (ref 0.0–0.2)

## 2019-01-17 MED ORDER — HEPARIN SOD (PORK) LOCK FLUSH 100 UNIT/ML IV SOLN: 500.0000 [IU] | Freq: Once | INTRAVENOUS | Status: DC

## 2019-01-17 MED ORDER — SODIUM CHLORIDE 0.9% FLUSH
10.0000 mL | Freq: Once | INTRAVENOUS | Status: DC
  Filled 2019-01-17: qty 10

## 2019-01-17 NOTE — Progress Notes (Signed)
Pt doing good, no complaints 

## 2019-01-17 NOTE — Progress Notes (Signed)
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 Patient is a 78 year old female with no significant comorbidities who noticed left breast mass about 6-9 months ago. She thought it would go awaybut it continued to increase in size and began to involve her skin. She also noted tenderness to palpation as well as intermittent sharp tingling pain. She was seen by Dr. Adora Fridge on 08/25/2016 and patient had a biopsy of her skin in his office which showed:DIAGNOSIS:  A. LEFT BREAST SKIN; EXCISION:  - INVASIVE CARCINOMA MORPHOLOGICALLY CONSISTENT WITH MAMMARY ORIGIN  INVOLVING THE DERMIS.   BREAST BIOMARKER TESTS  Estrogen Receptor (ER) Status: POSITIVE, >90%  Progesterone Receptor (PgR) Status: POSITIVE, >70%  Her2 negative  2. Patient underwent bilateral diagnostic mammogram on 08/31/2016 showed:IMPRESSION: 1. Known left breast cancer, retroareolar with probable extension to the nipple, measuring 4.3 cm greatest dimension by ultrasound, with associated architectural distortion and associated left nipple retraction, and with associated diffuse skin thickening on the left. 2. No enlarged or morphologically abnormal lymph nodes are seen in the left axilla by ultrasound. 3. No evidence of malignancy within the right breast.  3. Patient was also complaining of some perirectal pain and underwent CT abdomen pelvis with contrast on 09/02/2016 which showed: IMPRESSION: Left colonic diverticulosis. No active diverticulitis.Fatty liver. No acute findings or evidence of  metastatic disease in the abdomen or pelvis. Diffuse bony sclerotic metastases.  4. Other than that her breast pain patient overall feels well. Denies any unintentional weight loss. She continues to be active and care for her great-grandchildren. She has had a hysterectomy in the past. No family history of breast or ovarian cancer.  5. Given that she had inflammatory breast cancer- plan was to give 4 cycles of dose dense AC followed by possible mastectomy and treat bone mets with AI + ibrance  6. PET CT scan on 09/11/16 showed: IMPRESSION: 1. Low-grade but abnormal hypermetabolic activity associated with the cutaneous and sub areolar glandular tissues of the left breast, compatible with malignancy, representative SUV 3.9 compared to contralateral normal side 1.4. 2. Diffuse sclerotic osseous metastatic disease with low-grade hypermetabolic activity, a representative SUV along the left sacrum 5.4. 3. Other imaging findings of potential clinical significance: Aortic Atherosclerosis (ICD10-I70.0). Descending and sigmoid colon Diverticulosis.  7. Baseline MUGA scan showed EF of 71%. Baseline CA 27.29 elevated at 563.2  8. 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  9. Letrozole and ibrance started in sept 2018. zometa monthly also started. Baseline bone density scan normal  10. ibrance held in oct 2018 after cycle 1 due to Grade 3 neutropenia and thrombocytopenia.cycle 2 restaretd on 02/04/17 at 100 mg. Patient continued to have prolongeed grade 3 neutropenia and dose lowered to 75 mg  11. Patient had extensive RLE DVT in April 2019 and is currently on eliquis. She also underwent venous thrombectomy and thrombolytic therapy and IVC placement by Dr. Lucky Cowboy.She also has peripheral vascular disease secondary to atherosclerosis and underwent stenting with vascular surgery as well   Interval history- she is doing well. She denies any complaints at this time  ECOG  PS- 1  Pain scale- 0   Review of systems- Review of Systems  Constitutional: Negative for chills, fever, malaise/fatigue and weight loss.  HENT: Negative for congestion, ear discharge and nosebleeds.   Eyes: Negative for blurred vision.  Respiratory: Negative for cough, hemoptysis, sputum production, shortness of breath and wheezing.   Cardiovascular: Negative for chest pain, palpitations, orthopnea and claudication.  Gastrointestinal: Negative for abdominal pain, blood in stool, constipation, diarrhea, heartburn, melena, nausea and vomiting.  Genitourinary: Negative for dysuria, flank pain, frequency, hematuria and urgency.  Musculoskeletal: Negative for back pain, joint pain and myalgias.  Skin: Negative for rash.  Neurological: Negative for dizziness, tingling, focal weakness, seizures, weakness and headaches.  Endo/Heme/Allergies: Does not bruise/bleed easily.  Psychiatric/Behavioral: Negative for depression and suicidal ideas. The patient does not have insomnia.       No Known Allergies   Past Medical History:  Diagnosis Date  . Breast cancer ([place])   . Cancer ([place])    left breast  . DVT (deep venous thrombosis) ([place])    2019  . Hypertension   . Port catheter in place 09/14/2016   Placed 09/10/2016 RT chest.     Past Surgical History:  Procedure Laterality Date  . ABDOMINAL HYSTERECTOMY    . APPENDECTOMY    . BREAST BIOPSY    . CYST EXCISION Right 09/2016   Buttocks  . IVC FILTER REMOVAL N/A 01/17/2018   Procedure: IVC FILTER REMOVAL;  Surgeon: Algernon Huxley, MD;  Location: [facility];  Service: Cardiovascular;  Laterality: N/A;  . LOWER EXTREMITY ANGIOGRAPHY Right 11/11/2017   Procedure: LOWER EXTREMITY ANGIOGRAPHY;  Surgeon: Katha Cabal, MD;  Location: [facility];  Service: Cardiovascular;  Laterality: Right;  . PERIPHERAL VASCULAR THROMBECTOMY Right 08/30/2017   Procedure: PERIPHERAL VASCULAR THROMBECTOMY;  Surgeon: Algernon Huxley, MD;  Location:  [facility];  Service: Cardiovascular;  Laterality: Right;  . PORTACATH PLACEMENT Right 09/10/2016   Procedure: INSERTION PORT-A-CATH;  Surgeon: Jules Husbands, MD;  Location: [facility];  Service: General;  Laterality: Right;    Social History   Socioeconomic History  . Marital status: Married    Spouse name: Jeneen Rinks  . Number of children: 2  . Years of education: 6  . Highest education level: Bachelor's degree (e.g., BA, AB, BS)  Occupational History  . 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[employer]  Social Needs  . Financial resource strain: 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  . Food insecurity    Worry: Never true    Inability: Never true  . Transportation needs    Medical: No    Non-medical: No  Tobacco Use  . Smoking status: Never Smoker  . Smokeless tobacco: Never Used  Substance and Sexual Activity  . Alcohol use: No  . Drug use: No  . Sexual activity: Never  Lifestyle  . Physical activity    Days per week: Patient refused    Minutes per session: Patient refused  . Stress: Not at all  Relationships  . Social connections    Talks on phone: More than three times a week    Gets together: Once a week    Attends religious service: More than 4 times per year    Active member of club or organization: No    Attends meetings of clubs or organizations: Never    Relationship status: Not on file  . Intimate partner violence    Fear of current or ex partner:  Not on file    Emotionally abused: Not on file    Physically abused: Not on file    Forced sexual activity: Not on file  Other Topics Concern  . Not on file  Social History Narrative  . Not on file    Family History  Problem Relation Age of Onset  . Parkinson's disease Mother   . Bladder Cancer Father   . Lung cancer Father      Current Outpatient Medications:  .  acetaminophen (TYLENOL) 500 MG tablet, Take 500 mg by mouth daily as needed for moderate pain or headache. , Disp: , Rfl:  .  amLODipine  (NORVASC) 10 MG tablet, Take 10 mg by mouth daily., Disp: , Rfl:  .  apixaban (ELIQUIS) 5 MG TABS tablet, Take 1 tablet (5 mg total) by mouth 2 (two) times daily., Disp: 60 tablet, Rfl: 6 .  aspirin EC 81 MG tablet, Take 1 tablet (81 mg total) by mouth daily., Disp: 150 tablet, Rfl: 2 .  Calcium Carb-Cholecalciferol (CALCIUM 600/VITAMIN D3 PO), Take 1 tablet by mouth daily., Disp: , Rfl:  .  Cholecalciferol (VITAMIN D3) 10 MCG (400 UNIT) CAPS, Take 1 capsule by mouth daily., Disp: , Rfl:  .  ciprofloxacin (CIPRO) 500 MG tablet, Take 1 tablet (500 mg total) by mouth 2 (two) times daily., Disp: 20 tablet, Rfl: 0 .  ibuprofen (ADVIL,MOTRIN) 400 MG tablet, Take 400 mg by mouth 3 (three) times daily as needed., Disp: , Rfl:  .  letrozole (FEMARA) 2.5 MG tablet, Take 1 tablet by mouth once daily, Disp: 90 tablet, Rfl: 0 .  lidocaine-prilocaine (EMLA) cream, Apply 1 application topically as needed (port access)., Disp: 30 g, Rfl: 2 .  metroNIDAZOLE (FLAGYL) 500 MG tablet, Take 1 tablet (500 mg total) by mouth 3 (three) times daily., Disp: 30 tablet, Rfl: 0 .  palbociclib (IBRANCE) 75 MG tablet, Take 1 tablet (75 mg total) by mouth daily. Take for 21 days on, 7 days off, repeat every 28 days., Disp: 21 tablet, Rfl: 3 .  PREVIDENT 5000 BOOSTER PLUS 1.1 % PSTE, Place 1 application onto teeth 2 (two) times daily. , Disp: , Rfl:  .  prochlorperazine (COMPAZINE) 10 MG tablet, Take 1 tablet (10 mg total) by mouth every 6 (six) hours as needed (Nausea or vomiting)., Disp: 30 tablet, Rfl: 2 No current facility-administered medications for this visit.   Facility-Administered Medications Ordered in Other Visits:  .  heparin lock flush 100 unit/mL, 500 Units, Intravenous, Once, Sindy Guadeloupe, MD .  sodium chloride flush (NS) 0.9 % injection 10 mL, 10 mL, Intravenous, Once, Sindy Guadeloupe, MD  Physical exam:  Vitals:   01/17/19 1135  BP: (!) 162/79  Pulse: 62  Resp: 16  Temp: 97.6 F (36.4 C)  TempSrc:  Tympanic  Weight: 203 lb 1.6 oz (92.1 kg)   Physical Exam Constitutional:      General: She is not in acute distress. HENT:     Head: Normocephalic and atraumatic.  Eyes:     Pupils: Pupils are equal, round, and reactive to light.  Neck:     Musculoskeletal: Normal range of motion.  Cardiovascular:     Rate and Rhythm: Normal rate and regular rhythm.     Heart sounds: Normal heart sounds.  Pulmonary:     Effort: Pulmonary effort is normal.     Breath sounds: Normal breath sounds.  Abdominal:     General: Bowel sounds are normal.     Palpations:  Abdomen is soft.  Skin:    General: Skin is warm and dry.  Neurological:     Mental Status: She is alert and oriented to person, place, and time.      CMP Latest Ref Rng & Units 01/17/2019  Glucose 70 - 99 mg/dL 122(H)  BUN 8 - 23 mg/dL 11  Creatinine 0.44 - 1.00 mg/dL 0.79  Sodium 135 - 145 mmol/L 137  Potassium 3.5 - 5.1 mmol/L 3.7  Chloride 98 - 111 mmol/L 106  CO2 22 - 32 mmol/L 23  Calcium 8.9 - 10.3 mg/dL 9.5  Total Protein 6.5 - 8.1 g/dL 7.2  Total Bilirubin 0.3 - 1.2 mg/dL 1.2  Alkaline Phos 38 - 126 U/L 58  AST 15 - 41 U/L 30  ALT 0 - 44 U/L 30   CBC Latest Ref Rng & Units 01/17/2019  WBC 4.0 - 10.5 K/uL 2.5(L)  Hemoglobin 12.0 - 15.0 g/dL 12.4  Hematocrit 36.0 - 46.0 % 34.8(L)  Platelets 150 - 400 K/uL 189      Assessment and plan- Patient is a 78 y.o. female with metastatic breast cancer and bone only metastases currently on letrozole and Ibrance.she is here for routne f/u of breast cancer  Counts ok to proceed with next cycle of ibrance on 01/21/19. She has neutropenia from ibrance but ANC >1. Therefore no need for treatment interruption.  Cbc with differential and CMP in 1 month and 2 months and I will see her back in 2 months.  She will receive her Zometa at that time for her bone metastases. Also check CA-27-29 in 2 months  Continue letrozole  Chronic RLE DVT: continue eliquis   Visit Diagnosis 1.  High risk medication use   2. Drug-induced neutropenia (H[facility])   3. Bone metastases ([place])   4. Malignant neoplasm of left breast in female, estrogen receptor positive, unspecified site of breast ([place])      Dr. Randa Evens, MD, MPH [facility] at [facility] [phone] 01/17/2019 12:49 PM

## 2019-01-18 LAB — CANCER ANTIGEN 27.29: CA 27.29: 81.5 U/mL — ABNORMAL HIGH (ref 0.0–38.6)

## 2019-02-08 ENCOUNTER — Other Ambulatory Visit: Payer: Self-pay | Admitting: Oncology

## 2019-02-08 DIAGNOSIS — C50912 Malignant neoplasm of unspecified site of left female breast: Secondary | ICD-10-CM

## 2019-02-08 NOTE — Telephone Encounter (Signed)
CBC with Differential/Platelet Order: [id number] Status:  Final result  Visible to patient:  No (not released)  Next appt:  02/17/2019 at 10:45 AM in Oncology ([facility])  Dx:  Malignant neoplasm of left breast in ...  Ref Range & Units 3wk ago  WBC 4.0 - 10.5 K/uL 2.5Low    RBC 3.87 - 5.11 MIL/uL 3.39Low    Hemoglobin 12.0 - 15.0 g/dL 12.4   HCT 36.0 - 46.0 % 34.8Low    MCV 80.0 - 100.0 fL 102.7High    MCH 26.0 - 34.0 pg 36.6High    MCHC 30.0 - 36.0 g/dL 35.6   RDW 11.5 - 15.5 % 14.9   Platelets 150 - 400 K/uL 189   nRBC 0.0 - 0.2 % 0.0   Neutrophils Relative % % 50   Neutro Abs 1.7 - 7.7 K/uL 1.2Low    Lymphocytes Relative % 37   Lymphs Abs 0.7 - 4.0 K/uL 0.9   Monocytes Relative % 9   Monocytes Absolute 0.1 - 1.0 K/uL 0.2   Eosinophils Relative % 2   Eosinophils Absolute 0.0 - 0.5 K/uL 0.1   Basophils Relative % 2   Basophils Absolute 0.0 - 0.1 K/uL 0.1   Immature Granulocytes % 0   Abs Immature Granulocytes 0.00 - 0.07 K/uL 0.01   Comment: Performed at [facility], [address] [place], [place] [address]  Resulting Agency  [facility]      Specimen Collected: 01/17/19 10:53  Last Resulted: 01/17/19 11:06     Lab Flowsheet   Order Details   View Encounter   Lab and Collection Details   Routing   Result History         Other Results from 01/17/2019  Contains abnormal data Comprehensive metabolic panel Order: [id number]  Status:  Final result  Visible to patient:  No (not released)  Next appt:  02/17/2019 at 10:45 AM in Oncology ([facility])  Dx:  Malignant neoplasm of left breast in ...  Ref Range & Units 3wk ago  Sodium 135 - 145 mmol/L 137   Potassium 3.5 - 5.1 mmol/L 3.7   Chloride 98 - 111 mmol/L 106   CO2 22 - 32 mmol/L 23   Glucose, Bld 70 - 99 mg/dL 122High    BUN 8 - 23 mg/dL 11   Creatinine, Ser 0.44 - 1.00 mg/dL 0.79   Calcium 8.9 - 10.3 mg/dL 9.5   Total Protein 6.5 - 8.1 g/dL 7.2   Albumin 3.5 - 5.0 g/dL 4.1   AST 15 -  41 U/L 30   ALT 0 - 44 U/L 30   Alkaline Phosphatase 38 - 126 U/L 58   Total Bilirubin 0.3 - 1.2 mg/dL 1.2   GFR calc non Af Amer >60 mL/min >60   GFR calc Af Amer >60 mL/min >60   Anion gap 5 - 15 8   Comment: Performed at [facility], [address] [place], [place] [address]  Resulting Agency  [facility]      Specimen Collected: 01/17/19 10:53  Last Resulted: 01/17/19 11:19     Lab Flowsheet   Order Details   View Encounter   Lab and Collection Details   Routing   Result History           Contains abnormal data Cancer antigen 27.29 Order: [id number]  Status:  Final result  Visible to patient:  No (not released)  Next appt:  02/17/2019 at 10:45 AM in Oncology ([facility])  Dx:  Malignant neoplasm of left breast in ...  Ref Range & Units 3wk ago  CA 27.29 0.0 - 38.6 U/mL 81.5High    Comment: (NOTE)  [employer] Immunochemiluminometric Methodology (ICMA)  Values obtained with different assay methods or kits cannot be used  interchangeably. Results cannot be interpreted as absolute evidence  of the presence or absence of malignant disease.  Performed At: [facility]  [place], [place] [id number]  Rush Farmer MD [id number]   Resulting Agency  [facility]      Specimen Collected: 01/17/19 10:53  Last Resulted: 01/18/19 02:35

## 2019-02-13 MED FILL — IBRANCE 75 MG TABS: 75 | 28 days supply | Qty: 21 | Fill #0

## 2019-02-17 ENCOUNTER — Other Ambulatory Visit: Payer: Self-pay

## 2019-02-17 ENCOUNTER — Inpatient Hospital Stay: Payer: Medicare Other | Attending: Oncology

## 2019-02-17 DIAGNOSIS — C50912 Malignant neoplasm of unspecified site of left female breast: Secondary | ICD-10-CM

## 2019-02-17 DIAGNOSIS — Z79899 Other long term (current) drug therapy: Secondary | ICD-10-CM

## 2019-02-17 DIAGNOSIS — Z170 Estrogen receptor positive status [ER+]: Secondary | ICD-10-CM | POA: Diagnosis not present

## 2019-02-17 LAB — COMPREHENSIVE METABOLIC PANEL
ALT: 29 U/L (ref 0–44)
AST: 29 U/L (ref 15–41)
Albumin: 4.3 g/dL (ref 3.5–5.0)
Alkaline Phosphatase: 64 U/L (ref 38–126)
Anion gap: 10 (ref 5–15)
BUN: 11 mg/dL (ref 8–23)
CO2: 23 mmol/L (ref 22–32)
Calcium: 9.3 mg/dL (ref 8.9–10.3)
Chloride: 103 mmol/L (ref 98–111)
Creatinine, Ser: 0.85 mg/dL (ref 0.44–1.00)
GFR calc Af Amer: >60 mL/min (ref >60)
GFR calc non Af Amer: >60 mL/min (ref >60)
Glucose, Bld: 140 mg/dL — ABNORMAL HIGH (ref 70–99)
Potassium: 4.2 mmol/L (ref 3.5–5.1)
Sodium: 136 mmol/L (ref 135–145)
Total Bilirubin: 1 mg/dL (ref 0.3–1.2)
Total Protein: 7.6 g/dL (ref 6.5–8.1)

## 2019-02-17 LAB — CBC WITH DIFFERENTIAL/PLATELET
Abs Immature Granulocytes: 0.02 10*3/uL (ref 0.00–0.07)
Basophils Absolute: 0.1 10*3/uL (ref 0.0–0.1)
Basophils Relative: 2 %
Eosinophils Absolute: 0.1 10*3/uL (ref 0.0–0.5)
Eosinophils Relative: 2 %
HCT: 37.4 % (ref 36.0–46.0)
Hemoglobin: 13.2 g/dL (ref 12.0–15.0)
Immature Granulocytes: 1 %
Lymphocytes Relative: 38 %
Lymphs Abs: 1 10*3/uL (ref 0.7–4.0)
MCH: 36.9 pg — ABNORMAL HIGH (ref 26.0–34.0)
MCHC: 35.3 g/dL (ref 30.0–36.0)
MCV: 104.5 fL — ABNORMAL HIGH (ref 80.0–100.0)
Monocytes Absolute: 0.3 10*3/uL (ref 0.1–1.0)
Monocytes Relative: 9 %
Neutro Abs: 1.3 10*3/uL — ABNORMAL LOW (ref 1.7–7.7)
Neutrophils Relative %: 48 %
Platelets: 208 10*3/uL (ref 150–400)
RBC: 3.58 MIL/uL — ABNORMAL LOW (ref 3.87–5.11)
RDW: 15.1 % (ref 11.5–15.5)
WBC: 2.7 10*3/uL — ABNORMAL LOW (ref 4.0–10.5)
nRBC: 0 % (ref 0.0–0.2)

## 2019-03-13 MED FILL — IBRANCE 75 MG TABS: 75 | 28 days supply | Qty: 21 | Fill #1

## 2019-03-19 ENCOUNTER — Other Ambulatory Visit: Payer: Self-pay | Admitting: Oncology

## 2019-03-19 DIAGNOSIS — C50912 Malignant neoplasm of unspecified site of left female breast: Secondary | ICD-10-CM

## 2019-03-20 ENCOUNTER — Telehealth: Payer: Self-pay | Admitting: *Deleted

## 2019-03-20 ENCOUNTER — Ambulatory Visit: Payer: Medicare Other

## 2019-03-20 ENCOUNTER — Other Ambulatory Visit: Payer: Self-pay

## 2019-03-20 ENCOUNTER — Encounter: Payer: Self-pay | Admitting: Oncology

## 2019-03-20 ENCOUNTER — Inpatient Hospital Stay: Payer: Medicare Other | Attending: Oncology | Admitting: *Deleted

## 2019-03-20 ENCOUNTER — Inpatient Hospital Stay (HOSPITAL_BASED_OUTPATIENT_CLINIC_OR_DEPARTMENT_OTHER): Payer: Medicare Other | Admitting: Oncology

## 2019-03-20 ENCOUNTER — Inpatient Hospital Stay: Payer: Medicare Other

## 2019-03-20 VITALS — BP 184/70 | HR 70 | Temp 98.2°F | Wt 205.1 lb

## 2019-03-20 DIAGNOSIS — Z7983 Long term (current) use of bisphosphonates: Secondary | ICD-10-CM

## 2019-03-20 DIAGNOSIS — C7951 Secondary malignant neoplasm of bone: Secondary | ICD-10-CM | POA: Diagnosis not present

## 2019-03-20 DIAGNOSIS — C50112 Malignant neoplasm of central portion of left female breast: Secondary | ICD-10-CM | POA: Insufficient documentation

## 2019-03-20 DIAGNOSIS — C50912 Malignant neoplasm of unspecified site of left female breast: Secondary | ICD-10-CM

## 2019-03-20 DIAGNOSIS — Z170 Estrogen receptor positive status [ER+]: Secondary | ICD-10-CM | POA: Diagnosis not present

## 2019-03-20 DIAGNOSIS — D702 Other drug-induced agranulocytosis: Secondary | ICD-10-CM | POA: Diagnosis not present

## 2019-03-20 DIAGNOSIS — Z95828 Presence of other vascular implants and grafts: Secondary | ICD-10-CM

## 2019-03-20 DIAGNOSIS — Z79899 Other long term (current) drug therapy: Secondary | ICD-10-CM

## 2019-03-20 LAB — COMPREHENSIVE METABOLIC PANEL
ALT: 42 U/L (ref 0–44)
AST: 38 U/L (ref 15–41)
Albumin: 4.1 g/dL (ref 3.5–5.0)
Alkaline Phosphatase: 55 U/L (ref 38–126)
Anion gap: 7 (ref 5–15)
BUN: 11 mg/dL (ref 8–23)
CO2: 25 mmol/L (ref 22–32)
Calcium: 9.6 mg/dL (ref 8.9–10.3)
Chloride: 106 mmol/L (ref 98–111)
Creatinine, Ser: 0.83 mg/dL (ref 0.44–1.00)
GFR calc Af Amer: >60 mL/min (ref >60)
GFR calc non Af Amer: >60 mL/min (ref >60)
Glucose, Bld: 127 mg/dL — ABNORMAL HIGH (ref 70–99)
Potassium: 3.5 mmol/L (ref 3.5–5.1)
Sodium: 138 mmol/L (ref 135–145)
Total Bilirubin: 1.1 mg/dL (ref 0.3–1.2)
Total Protein: 7 g/dL (ref 6.5–8.1)

## 2019-03-20 LAB — CBC WITH DIFFERENTIAL/PLATELET
Abs Immature Granulocytes: 0.03 10*3/uL (ref 0.00–0.07)
Basophils Absolute: 0.1 10*3/uL (ref 0.0–0.1)
Basophils Relative: 3 %
Eosinophils Absolute: 0.1 10*3/uL (ref 0.0–0.5)
Eosinophils Relative: 3 %
HCT: 35.8 % — ABNORMAL LOW (ref 36.0–46.0)
Hemoglobin: 12.3 g/dL (ref 12.0–15.0)
Immature Granulocytes: 1 %
Lymphocytes Relative: 39 %
Lymphs Abs: 1.1 10*3/uL (ref 0.7–4.0)
MCH: 36.4 pg — ABNORMAL HIGH (ref 26.0–34.0)
MCHC: 34.4 g/dL (ref 30.0–36.0)
MCV: 105.9 fL — ABNORMAL HIGH (ref 80.0–100.0)
Monocytes Absolute: 0.3 10*3/uL (ref 0.1–1.0)
Monocytes Relative: 11 %
Neutro Abs: 1.2 10*3/uL — ABNORMAL LOW (ref 1.7–7.7)
Neutrophils Relative %: 43 %
Platelets: 221 10*3/uL (ref 150–400)
RBC: 3.38 MIL/uL — ABNORMAL LOW (ref 3.87–5.11)
RDW: 15.5 % (ref 11.5–15.5)
WBC: 2.9 10*3/uL — ABNORMAL LOW (ref 4.0–10.5)
nRBC: 0 % (ref 0.0–0.2)

## 2019-03-20 MED ORDER — SODIUM CHLORIDE 0.9% FLUSH
10.0000 mL | Freq: Once | INTRAVENOUS | Status: AC
  Administered 2019-03-20: 11:00:00 10 mL via INTRAVENOUS
  Filled 2019-03-20: qty 10

## 2019-03-20 MED ORDER — SODIUM CHLORIDE 0.9 % IV SOLN
Freq: Once | INTRAVENOUS | Status: AC
  Administered 2019-03-20: 12:00:00 via INTRAVENOUS
  Filled 2019-03-20: qty 250

## 2019-03-20 MED ORDER — LETROZOLE 2.5 MG PO TABS: 2.5000 mg | ORAL_TABLET | Freq: Every day | ORAL | 3 refills | Status: DC

## 2019-03-20 MED ORDER — HEPARIN SOD (PORK) LOCK FLUSH 100 UNIT/ML IV SOLN
500.0000 [IU] | Freq: Once | INTRAVENOUS | Status: AC | PRN
  Administered 2019-03-20: 500 [IU]
  Filled 2019-03-20: qty 5

## 2019-03-20 MED ORDER — ZOLEDRONIC ACID 4 MG/100ML IV SOLN
4.0000 mg | Freq: Once | INTRAVENOUS | Status: AC
  Administered 2019-03-20: 4 mg via INTRAVENOUS
  Filled 2019-03-20: qty 100

## 2019-03-20 NOTE — Progress Notes (Signed)
Patient stated that she had been doing well with no complaints. Patient stated that she does monthly self breast exams. Patient will need a refill on her Letrozole.

## 2019-03-21 NOTE — Progress Notes (Signed)
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 Patient is a 78 year old female with no significant comorbidities who noticed left breast mass about 6-9 months ago. She thought it would go awaybut it continued to increase in size and began to involve her skin. She also noted tenderness to palpation as well as intermittent sharp tingling pain. She was seen by Dr. Adora Fridge on 08/25/2016 and patient had a biopsy of her skin in his office which showed:DIAGNOSIS:  A. LEFT BREAST SKIN; EXCISION:  - INVASIVE CARCINOMA MORPHOLOGICALLY CONSISTENT WITH MAMMARY ORIGIN  INVOLVING THE DERMIS.   BREAST BIOMARKER TESTS  Estrogen Receptor (ER) Status: POSITIVE, >90%  Progesterone Receptor (PgR) Status: POSITIVE, >70%  Her2 negative  2. Patient underwent bilateral diagnostic mammogram on 08/31/2016 showed:IMPRESSION: 1. Known left breast cancer, retroareolar with probable extension to the nipple, measuring 4.3 cm greatest dimension by ultrasound, with associated architectural distortion and associated left nipple retraction, and with associated diffuse skin thickening on the left. 2. No enlarged or morphologically abnormal lymph nodes are seen in the left axilla by ultrasound. 3. No evidence of malignancy within the right breast.  3. Patient was also complaining of some perirectal pain and underwent CT abdomen pelvis with contrast on 09/02/2016 which showed: IMPRESSION: Left colonic diverticulosis. No active diverticulitis.Fatty liver. No acute findings or evidence  of metastatic disease in the abdomen or pelvis. Diffuse bony sclerotic metastases.  4. Other than that her breast pain patient overall feels well. Denies any unintentional weight loss. She continues to be active and care for her great-grandchildren. She has had a hysterectomy in the past. No family history of breast or ovarian cancer.  5. Given that she had inflammatory breast cancer- plan was to give 4 cycles of dose dense AC followed by possible mastectomy and treat bone mets with AI + ibrance  6. PET CT scan on 09/11/16 showed: IMPRESSION: 1. Low-grade but abnormal hypermetabolic activity associated with the cutaneous and sub areolar glandular tissues of the left breast, compatible with malignancy, representative SUV 3.9 compared to contralateral normal side 1.4. 2. Diffuse sclerotic osseous metastatic disease with low-grade hypermetabolic activity, a representative SUV along the left sacrum 5.4. 3. Other imaging findings of potential clinical significance: Aortic Atherosclerosis (ICD10-I70.0). Descending and sigmoid colon Diverticulosis.  7. Baseline MUGA scan showed EF of 71%. Baseline CA 27.29 elevated at 563.2  8. 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  9. Letrozole and ibrance started in sept 2018. zometa monthly also started. Baseline bone density scan normal  10. ibrance held in oct 2018 after cycle 1 due to Grade 3 neutropenia and thrombocytopenia.cycle 2 restaretd on 02/04/17 at 100 mg. Patient continued to have prolongeed grade 3 neutropenia and dose lowered to 75 mg  11. Patient had extensive RLE DVT in April 2019 and is currently on eliquis. She also underwent venous thrombectomy and thrombolytic therapy and IVC placement by Dr. Lucky Cowboy.She also has peripheral vascular disease secondary to atherosclerosis and underwent stenting with vascular surgery as well   Interval history-overall patient feels well and denies any complaints at this  time.  ECOG  PS- 1 Pain scale- 0 Opioid associated constipation- no  Review of systems- Review of Systems  Constitutional: Negative for chills, fever, malaise/fatigue and weight loss.  HENT: Negative for congestion, ear discharge and nosebleeds.   Eyes: Negative for blurred vision.  Respiratory: Negative for cough, hemoptysis, sputum production, shortness of breath and wheezing.   Cardiovascular: Negative for chest pain, palpitations, orthopnea and claudication.  Gastrointestinal: Negative for abdominal pain, blood in stool, constipation, diarrhea, heartburn, melena, nausea and vomiting.  Genitourinary: Negative for dysuria, flank pain, frequency, hematuria and urgency.  Musculoskeletal: Negative for back pain, joint pain and myalgias.  Skin: Negative for rash.  Neurological: Negative for dizziness, tingling, focal weakness, seizures, weakness and headaches.  Endo/Heme/Allergies: Does not bruise/bleed easily.  Psychiatric/Behavioral: Negative for depression and suicidal ideas. The patient does not have insomnia.       No Known Allergies   Past Medical History:  Diagnosis Date   Breast cancer ([place])    Cancer ([place])    left breast   DVT (deep venous thrombosis) ([place])    2019   Hypertension    Port catheter in place 09/14/2016   Placed 09/10/2016 RT chest.     Past Surgical History:  Procedure Laterality Date   ABDOMINAL HYSTERECTOMY     APPENDECTOMY     BREAST BIOPSY     CYST EXCISION Right 09/2016   Buttocks   IVC FILTER REMOVAL N/A 01/17/2018   Procedure: IVC FILTER REMOVAL;  Surgeon: Algernon Huxley, MD;  Location: [place] [facility];  Service: Cardiovascular;  Laterality: N/A;   LOWER EXTREMITY ANGIOGRAPHY Right 11/11/2017   Procedure: LOWER EXTREMITY ANGIOGRAPHY;  Surgeon: Katha Cabal, MD;  Location: [place] [facility];  Service: Cardiovascular;  Laterality: Right;   PERIPHERAL VASCULAR THROMBECTOMY Right 08/30/2017   Procedure: PERIPHERAL VASCULAR  THROMBECTOMY;  Surgeon: Algernon Huxley, MD;  Location: [place] [facility];  Service: Cardiovascular;  Laterality: Right;   PORTACATH PLACEMENT Right 09/10/2016   Procedure: INSERTION PORT-A-CATH;  Surgeon: Jules Husbands, MD;  Location: [facility];  Service: General;  Laterality: Right;    Social History   Socioeconomic History   Marital status: Married    Spouse name: Jeneen Rinks   Number of children: 2   Years of education: 6   Highest education level: Bachelor's degree (e.g., BA, AB, BS)  Occupational History   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[employer] resource strain: 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   Food insecurity    Worry: Never true    Inability: Never true   Transportation needs    Medical: No    Non-medical: No  Tobacco Use   Smoking status: Never Smoker   Smokeless tobacco: Never Used  Substance and Sexual Activity   Alcohol use: No   Drug use: No   Sexual activity: Never  Lifestyle   Physical activity    Days per week: Patient refused    Minutes per session: Patient refused   Stress: Not at all  Relationships   Social connections    Talks on phone: More than three times a week    Gets together: Once a week    Attends religious service: More than 4 times per year    Active member of club or organization: No    Attends meetings of clubs or organizations: Never    Relationship status: Not on file   Intimate partner violence    Fear of current or  ex partner: Not on file    Emotionally abused: Not on file    Physically abused: Not on file    Forced sexual activity: Not on file  Other Topics Concern   Not on file  Social History Narrative   Not on file    Family History  Problem Relation Age of Onset   Parkinson's disease Mother    Bladder Cancer Father    Lung cancer Father      Current Outpatient Medications:    acetaminophen (TYLENOL) 500 MG tablet, Take 500 mg by mouth daily as needed for  moderate pain or headache. , Disp: , Rfl:    amLODipine (NORVASC) 10 MG tablet, Take 10 mg by mouth daily., Disp: , Rfl:    apixaban (ELIQUIS) 5 MG TABS tablet, Take 1 tablet (5 mg total) by mouth 2 (two) times daily., Disp: 60 tablet, Rfl: 6   aspirin EC 81 MG tablet, Take 1 tablet (81 mg total) by mouth daily., Disp: 150 tablet, Rfl: 2   Calcium Carb-Cholecalciferol (CALCIUM 600/VITAMIN D3 PO), Take 1 tablet by mouth daily., Disp: , Rfl:    Cholecalciferol (VITAMIN D3) 10 MCG (400 UNIT) CAPS, Take 1 capsule by mouth daily., Disp: , Rfl:    IBRANCE 75 MG tablet, TAKE 1 TABLET (75 MG TOTAL) BY MOUTH DAILY. TAKE FOR 21 DAYS ON, 7 DAYS OFF, REPEAT EVERY 28 DAYS., Disp: 21 tablet, Rfl: 3   ibuprofen (ADVIL,MOTRIN) 400 MG tablet, Take 400 mg by mouth 3 (three) times daily as needed., Disp: , Rfl:    letrozole (FEMARA) 2.5 MG tablet, Take 1 tablet (2.5 mg total) by mouth daily., Disp: 90 tablet, Rfl: 3   lidocaine-prilocaine (EMLA) cream, Apply 1 application topically as needed (port access)., Disp: 30 g, Rfl: 2   PREVIDENT 5000 BOOSTER PLUS 1.1 % PSTE, Place 1 application onto teeth 2 (two) times daily. , Disp: , Rfl:    prochlorperazine (COMPAZINE) 10 MG tablet, Take 1 tablet (10 mg total) by mouth every 6 (six) hours as needed (Nausea or vomiting). (Patient not taking: Reported on 03/20/2019), Disp: 30 tablet, Rfl: 2  Physical exam:  Vitals:   03/20/19 1111  BP: (!) 184/70  Pulse: 70  Temp: 98.2 F (36.8 C)  TempSrc: Tympanic  Weight: 205 lb 1.6 oz (93 kg)   Physical Exam Constitutional:      General: She is not in acute distress. HENT:     Head: Normocephalic and atraumatic.  Eyes:     Pupils: Pupils are equal, round, and reactive to light.  Neck:     Musculoskeletal: Normal range of motion.  Cardiovascular:     Rate and Rhythm: Normal rate and regular rhythm.     Heart sounds: Normal heart sounds.  Pulmonary:     Effort: Pulmonary effort is normal.     Breath sounds:  Normal breath sounds.  Abdominal:     General: Bowel sounds are normal.     Palpations: Abdomen is soft.  Skin:    General: Skin is warm and dry.  Neurological:     Mental Status: She is alert and oriented to person, place, and time.      CMP Latest Ref Rng & Units 03/20/2019  Glucose 70 - 99 mg/dL 127(H)  BUN 8 - 23 mg/dL 11  Creatinine 0.44 - 1.00 mg/dL 0.83  Sodium 135 - 145 mmol/L 138  Potassium 3.5 - 5.1 mmol/L 3.5  Chloride 98 - 111 mmol/L 106  CO2 22 - 32 mmol/L  25  Calcium 8.9 - 10.3 mg/dL 9.6  Total Protein 6.5 - 8.1 g/dL 7.0  Total Bilirubin 0.3 - 1.2 mg/dL 1.1  Alkaline Phos 38 - 126 U/L 55  AST 15 - 41 U/L 38  ALT 0 - 44 U/L 42   CBC Latest Ref Rng & Units 03/20/2019  WBC 4.0 - 10.5 K/uL 2.9(L)  Hemoglobin 12.0 - 15.0 g/dL 12.3  Hematocrit 36.0 - 46.0 % 35.8(L)  Platelets 150 - 400 K/uL 221      Assessment and plan- Patient is a 78 y.o. female  with metastatic breast cancer and bone only metastases currently on letrozole and Ibrance. She is here for routine f/u of breast cancer  Patient continues to tolerate letrozole and Ibrance well without any significant side effects.  She has mild baseline leukopenia but her ANC is remain greater than 1.  Okay to continue Ibrance at 75 mg 3 weeks on 1 week off.  She will receive Zometa today.  CBC with differential, CMP and Zometa in 1 month.  I will see her back in 2 months with CBC with differential, CMP, CA 27-29 and CA 15-3 and get CT chest abdomen and pelvis with contrast and a bone scan prior  History of right lower extremity DVT: She is currently on Eliquis   Visit Diagnosis 1. Primary cancer of left breast with metastasis to other site [facility])   2. Malignant neoplasm of left breast in female, estrogen receptor positive, unspecified site of breast ([place])      Dr. Randa Evens, MD, MPH [facility] at [facility] [phone] 03/21/2019 2:03 PM

## 2019-03-24 LAB — CANCER ANTIGEN 27.29: CA 27.29: 74.8 U/mL — ABNORMAL HIGH (ref 0.0–38.6)

## 2019-04-10 MED FILL — IBRANCE 75 MG TABS: 75 | 28 days supply | Qty: 21 | Fill #2

## 2019-04-20 ENCOUNTER — Inpatient Hospital Stay: Payer: Medicare Other | Attending: Oncology

## 2019-04-20 ENCOUNTER — Other Ambulatory Visit: Payer: Self-pay

## 2019-04-20 DIAGNOSIS — C7951 Secondary malignant neoplasm of bone: Secondary | ICD-10-CM | POA: Insufficient documentation

## 2019-04-20 DIAGNOSIS — Z170 Estrogen receptor positive status [ER+]: Secondary | ICD-10-CM | POA: Diagnosis not present

## 2019-04-20 DIAGNOSIS — C50912 Malignant neoplasm of unspecified site of left female breast: Secondary | ICD-10-CM | POA: Insufficient documentation

## 2019-04-20 LAB — COMPREHENSIVE METABOLIC PANEL
ALT: 33 U/L (ref 0–44)
AST: 29 U/L (ref 15–41)
Albumin: 4.2 g/dL (ref 3.5–5.0)
Alkaline Phosphatase: 62 U/L (ref 38–126)
Anion gap: 8 (ref 5–15)
BUN: 11 mg/dL (ref 8–23)
CO2: 27 mmol/L (ref 22–32)
Calcium: 10 mg/dL (ref 8.9–10.3)
Chloride: 104 mmol/L (ref 98–111)
Creatinine, Ser: 0.87 mg/dL (ref 0.44–1.00)
GFR calc Af Amer: >60 mL/min (ref >60)
GFR calc non Af Amer: >60 mL/min (ref >60)
Glucose, Bld: 121 mg/dL — ABNORMAL HIGH (ref 70–99)
Potassium: 3.8 mmol/L (ref 3.5–5.1)
Sodium: 139 mmol/L (ref 135–145)
Total Bilirubin: 1.1 mg/dL (ref 0.3–1.2)
Total Protein: 7.5 g/dL (ref 6.5–8.1)

## 2019-04-20 LAB — CBC WITH DIFFERENTIAL/PLATELET
Abs Immature Granulocytes: 0.02 10*3/uL (ref 0.00–0.07)
Basophils Absolute: 0.1 10*3/uL (ref 0.0–0.1)
Basophils Relative: 3 %
Eosinophils Absolute: 0.2 10*3/uL (ref 0.0–0.5)
Eosinophils Relative: 5 %
HCT: 38.2 % (ref 36.0–46.0)
Hemoglobin: 12.9 g/dL (ref 12.0–15.0)
Immature Granulocytes: 1 %
Lymphocytes Relative: 27 %
Lymphs Abs: 0.8 10*3/uL (ref 0.7–4.0)
MCH: 36.4 pg — ABNORMAL HIGH (ref 26.0–34.0)
MCHC: 33.8 g/dL (ref 30.0–36.0)
MCV: 107.9 fL — ABNORMAL HIGH (ref 80.0–100.0)
Monocytes Absolute: 0.2 10*3/uL (ref 0.1–1.0)
Monocytes Relative: 5 %
Neutro Abs: 1.7 10*3/uL (ref 1.7–7.7)
Neutrophils Relative %: 59 %
Platelets: 296 10*3/uL (ref 150–400)
RBC: 3.54 MIL/uL — ABNORMAL LOW (ref 3.87–5.11)
RDW: 14.6 % (ref 11.5–15.5)
WBC: 2.9 10*3/uL — ABNORMAL LOW (ref 4.0–10.5)
nRBC: 0 % (ref 0.0–0.2)

## 2019-05-09 MED FILL — IBRANCE 75 MG TABS: 75 | 28 days supply | Qty: 21 | Fill #3

## 2019-05-15 ENCOUNTER — Encounter
Admission: RE | Admit: 2019-05-15 | Discharge: 2019-05-15 | Disposition: A | Discharge status: Home / Self Care | Payer: Medicare Other | Source: Ambulatory Visit | Attending: Oncology | Admitting: Oncology

## 2019-05-15 ENCOUNTER — Encounter (HOSPITAL_BASED_OUTPATIENT_CLINIC_OR_DEPARTMENT_OTHER)
Admission: RE | Admit: 2019-05-15 | Discharge: 2019-05-15 | Disposition: A | Discharge status: Home / Self Care | Payer: Medicare Other | Source: Ambulatory Visit | Attending: Oncology | Admitting: Oncology

## 2019-05-15 ENCOUNTER — Other Ambulatory Visit: Payer: Self-pay

## 2019-05-15 DIAGNOSIS — C7951 Secondary malignant neoplasm of bone: Secondary | ICD-10-CM | POA: Insufficient documentation

## 2019-05-15 DIAGNOSIS — I700 Atherosclerosis of aorta: Secondary | ICD-10-CM | POA: Insufficient documentation

## 2019-05-15 DIAGNOSIS — C50912 Malignant neoplasm of unspecified site of left female breast: Secondary | ICD-10-CM | POA: Diagnosis not present

## 2019-05-15 DIAGNOSIS — I2510 Atherosclerotic heart disease of native coronary artery without angina pectoris: Secondary | ICD-10-CM | POA: Diagnosis not present

## 2019-05-15 MED ORDER — TECHNETIUM TC 99M MEDRONATE IV KIT
20.0000 | PACK | Freq: Once | INTRAVENOUS | Status: AC | PRN
  Administered 2019-05-15: 23.41 via INTRAVENOUS

## 2019-05-15 MED ORDER — IOHEXOL 300 MG/ML  SOLN
100.0000 mL | Freq: Once | INTRAMUSCULAR | Status: AC | PRN
  Administered 2019-05-15: 100 mL via INTRAVENOUS

## 2019-05-16 ENCOUNTER — Other Ambulatory Visit: Payer: Self-pay

## 2019-05-16 DIAGNOSIS — C50912 Malignant neoplasm of unspecified site of left female breast: Secondary | ICD-10-CM

## 2019-05-18 ENCOUNTER — Other Ambulatory Visit: Payer: Self-pay

## 2019-05-18 ENCOUNTER — Inpatient Hospital Stay: Payer: Medicare Other | Attending: Oncology

## 2019-05-18 ENCOUNTER — Inpatient Hospital Stay: Payer: Medicare Other | Admitting: Oncology

## 2019-05-18 ENCOUNTER — Other Ambulatory Visit: Payer: Self-pay | Admitting: *Deleted

## 2019-05-18 ENCOUNTER — Encounter: Payer: Self-pay | Admitting: Oncology

## 2019-05-18 VITALS — BP 158/69 | HR 63 | Temp 98.0°F | Ht 62.0 in | Wt 203.0 lb

## 2019-05-18 DIAGNOSIS — Z79899 Other long term (current) drug therapy: Secondary | ICD-10-CM | POA: Insufficient documentation

## 2019-05-18 DIAGNOSIS — C7951 Secondary malignant neoplasm of bone: Secondary | ICD-10-CM

## 2019-05-18 DIAGNOSIS — Z7901 Long term (current) use of anticoagulants: Secondary | ICD-10-CM | POA: Diagnosis not present

## 2019-05-18 DIAGNOSIS — Z79811 Long term (current) use of aromatase inhibitors: Secondary | ICD-10-CM | POA: Diagnosis not present

## 2019-05-18 DIAGNOSIS — Z170 Estrogen receptor positive status [ER+]: Secondary | ICD-10-CM | POA: Diagnosis not present

## 2019-05-18 DIAGNOSIS — C50911 Malignant neoplasm of unspecified site of right female breast: Secondary | ICD-10-CM | POA: Diagnosis not present

## 2019-05-18 DIAGNOSIS — C50912 Malignant neoplasm of unspecified site of left female breast: Secondary | ICD-10-CM

## 2019-05-18 DIAGNOSIS — D702 Other drug-induced agranulocytosis: Secondary | ICD-10-CM | POA: Diagnosis not present

## 2019-05-18 LAB — COMPREHENSIVE METABOLIC PANEL
ALT: 34 U/L (ref 0–44)
AST: 30 U/L (ref 15–41)
Albumin: 4.3 g/dL (ref 3.5–5.0)
Alkaline Phosphatase: 61 U/L (ref 38–126)
Anion gap: 11 (ref 5–15)
BUN: 12 mg/dL (ref 8–23)
CO2: 23 mmol/L (ref 22–32)
Calcium: 9.7 mg/dL (ref 8.9–10.3)
Chloride: 102 mmol/L (ref 98–111)
Creatinine, Ser: 0.88 mg/dL (ref 0.44–1.00)
GFR calc Af Amer: >60 mL/min (ref >60)
GFR calc non Af Amer: >60 mL/min (ref >60)
Glucose, Bld: 124 mg/dL — ABNORMAL HIGH (ref 70–99)
Potassium: 3.8 mmol/L (ref 3.5–5.1)
Sodium: 136 mmol/L (ref 135–145)
Total Bilirubin: 1.1 mg/dL (ref 0.3–1.2)
Total Protein: 7.5 g/dL (ref 6.5–8.1)

## 2019-05-18 LAB — CBC WITH DIFFERENTIAL/PLATELET
Abs Immature Granulocytes: 0.02 10*3/uL (ref 0.00–0.07)
Basophils Absolute: 0.1 10*3/uL (ref 0.0–0.1)
Basophils Relative: 3 %
Eosinophils Absolute: 0.2 10*3/uL (ref 0.0–0.5)
Eosinophils Relative: 5 %
HCT: 38.2 % (ref 36.0–46.0)
Hemoglobin: 13.2 g/dL (ref 12.0–15.0)
Immature Granulocytes: 1 %
Lymphocytes Relative: 34 %
Lymphs Abs: 1 10*3/uL (ref 0.7–4.0)
MCH: 37.2 pg — ABNORMAL HIGH (ref 26.0–34.0)
MCHC: 34.6 g/dL (ref 30.0–36.0)
MCV: 107.6 fL — ABNORMAL HIGH (ref 80.0–100.0)
Monocytes Absolute: 0.2 10*3/uL (ref 0.1–1.0)
Monocytes Relative: 6 %
Neutro Abs: 1.5 10*3/uL — ABNORMAL LOW (ref 1.7–7.7)
Neutrophils Relative %: 51 %
Platelets: 262 10*3/uL (ref 150–400)
RBC: 3.55 MIL/uL — ABNORMAL LOW (ref 3.87–5.11)
RDW: 14.2 % (ref 11.5–15.5)
WBC: 3 10*3/uL — ABNORMAL LOW (ref 4.0–10.5)
nRBC: 0 % (ref 0.0–0.2)

## 2019-05-18 MED ORDER — PROCHLORPERAZINE MALEATE 10 MG PO TABS: 10.0000 mg | ORAL_TABLET | Freq: Four times a day (QID) | ORAL | 2 refills | Status: DC | PRN

## 2019-05-18 NOTE — Progress Notes (Signed)
Patient would like to know if she was able to see the dentist since her teeth are crumbling. Patient would also want to know if she should take the COVID-19 vaccine.

## 2019-05-19 ENCOUNTER — Telehealth: Payer: Self-pay | Admitting: Pharmacy Technician

## 2019-05-19 LAB — CANCER ANTIGEN 27.29: CA 27.29: 77.1 U/mL — ABNORMAL HIGH (ref 0.0–38.6)

## 2019-05-19 LAB — CANCER ANTIGEN 15-3: CA 15-3: 68.6 U/mL — ABNORMAL HIGH (ref 0.0–25.0)

## 2019-05-19 NOTE — Telephone Encounter (Signed)
Oral Oncology Patient Advocate Encounter  Was successful in securing patient a $15,000 grant from [employer] to provide copayment coverage for [place].  This will keep the out of pocket expense at $0.     [employer] ID: [id number]  I have spoken with the patient.   The billing information is as follows and has been shared with [place].    RxBin: [id number] PCN: [id number] Member ID: [id number] Group ID: [id number] Dates of Eligibility: 04/19/19 through 04/17/20  Fund:  [place] [facility] [place] Phone [phone] Fax [phone] 05/19/2019 3:02 PM

## 2019-05-19 NOTE — Progress Notes (Signed)
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 Patient is a 79 year old female with no significant comorbidities who noticed left breast mass about 6-9 months ago. She thought it would go awaybut it continued to increase in size and began to involve her skin. She also noted tenderness to palpation as well as intermittent sharp tingling pain. She was seen by Dr. Adora Fridge on 08/25/2016 and patient had a biopsy of her skin in his office which showed:DIAGNOSIS:  A. LEFT BREAST SKIN; EXCISION:  - INVASIVE CARCINOMA MORPHOLOGICALLY CONSISTENT WITH MAMMARY ORIGIN  INVOLVING THE DERMIS.   BREAST BIOMARKER TESTS  Estrogen Receptor (ER) Status: POSITIVE, >90%  Progesterone Receptor (PgR) Status: POSITIVE, >70%  Her2 negative  2. Patient underwent bilateral diagnostic mammogram on 08/31/2016 showed:IMPRESSION: 1. Known left breast cancer, retroareolar with probable extension to the nipple, measuring 4.3 cm greatest dimension by ultrasound, with associated architectural distortion and associated left nipple retraction, and with associated diffuse skin thickening on the left. 2. No enlarged or morphologically abnormal lymph nodes are seen in the left axilla by ultrasound. 3. No evidence of malignancy within the right breast.  3. Patient was also complaining of some perirectal pain and underwent CT abdomen pelvis with contrast on 09/02/2016 which showed: IMPRESSION: Left colonic diverticulosis. No active diverticulitis.Fatty liver. No acute findings or evidence of  metastatic disease in the abdomen or pelvis. Diffuse bony sclerotic metastases.  4. Other than that her breast pain patient overall feels well. Denies any unintentional weight loss. She continues to be active and care for her great-grandchildren. She has had a hysterectomy in the past. No family history of breast or ovarian cancer.  5. Given that she had inflammatory breast cancer- plan was to give 4 cycles of dose dense AC followed by possible mastectomy and treat bone mets with AI + ibrance  6. PET CT scan on 09/11/16 showed: IMPRESSION: 1. Low-grade but abnormal hypermetabolic activity associated with the cutaneous and sub areolar glandular tissues of the left breast, compatible with malignancy, representative SUV 3.9 compared to contralateral normal side 1.4. 2. Diffuse sclerotic osseous metastatic disease with low-grade hypermetabolic activity, a representative SUV along the left sacrum 5.4. 3. Other imaging findings of potential clinical significance: Aortic Atherosclerosis (ICD10-I70.0). Descending and sigmoid colon Diverticulosis.  7. Baseline MUGA scan showed EF of 71%. Baseline CA 27.29 elevated at 563.2  8. 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  9. Letrozole and ibrance started in sept 2018. zometa monthly also started. Baseline bone density scan normal  10. ibrance held in oct 2018 after cycle 1 due to Grade 3 neutropenia and thrombocytopenia.cycle 2 restaretd on 02/04/17 at 100 mg. Patient continued to have prolongeed grade 3 neutropenia and dose lowered to 75 mg  11. Patient had extensive RLE DVT in April 2019 and is currently on eliquis. She also underwent venous thrombectomy and thrombolytic therapy and IVC placement by Dr. Lucky Cowboy.She also has peripheral vascular disease secondary to atherosclerosis and underwent stenting with vascular surgery as well   Interval history-she feels well and denies any symptoms of fatigue or mouth sores. She has  been having ongoing dental issues and is not sure if she would be needed for root canal and will be seeing her dentist soon.  ECOG PS- 1 Pain scale- 0   Review of systems- Review of Systems  Constitutional: Negative for chills, fever, malaise/fatigue and weight loss.  HENT: Negative for congestion, ear discharge and nosebleeds.   Eyes: Negative for blurred vision.  Respiratory: Negative for cough, hemoptysis, sputum production, shortness of breath and wheezing.   Cardiovascular: Negative for chest pain, palpitations, orthopnea and claudication.  Gastrointestinal: Negative for abdominal pain, blood in stool, constipation, diarrhea, heartburn, melena, nausea and vomiting.  Genitourinary: Negative for dysuria, flank pain, frequency, hematuria and urgency.  Musculoskeletal: Negative for back pain, joint pain and myalgias.  Skin: Negative for rash.  Neurological: Negative for dizziness, tingling, focal weakness, seizures, weakness and headaches.  Endo/Heme/Allergies: Does not bruise/bleed easily.  Psychiatric/Behavioral: Negative for depression and suicidal ideas. The patient does not have insomnia.       No Known Allergies   Past Medical History:  Diagnosis Date  . Breast cancer ([place])   . Cancer ([place])    left breast  . DVT (deep venous thrombosis) ([place])    2019  . Hypertension   . Port catheter in place 09/14/2016   Placed 09/10/2016 RT chest.     Past Surgical History:  Procedure Laterality Date  . ABDOMINAL HYSTERECTOMY    . APPENDECTOMY    . BREAST BIOPSY    . CYST EXCISION Right 09/2016   Buttocks  . IVC FILTER REMOVAL N/A 01/17/2018   Procedure: IVC FILTER REMOVAL;  Surgeon: Algernon Huxley, MD;  Location: [facility];  Service: Cardiovascular;  Laterality: N/A;  . LOWER EXTREMITY ANGIOGRAPHY Right 11/11/2017   Procedure: LOWER EXTREMITY ANGIOGRAPHY;  Surgeon: Katha Cabal, MD;  Location: [facility];  Service: Cardiovascular;  Laterality: Right;  .  PERIPHERAL VASCULAR THROMBECTOMY Right 08/30/2017   Procedure: PERIPHERAL VASCULAR THROMBECTOMY;  Surgeon: Algernon Huxley, MD;  Location: [facility];  Service: Cardiovascular;  Laterality: Right;  . PORTACATH PLACEMENT Right 09/10/2016   Procedure: INSERTION PORT-A-CATH;  Surgeon: Jules Husbands, MD;  Location: [facility];  Service: General;  Laterality: Right;    Social History   Socioeconomic History  . Marital status: Married    Spouse name: Jeneen Rinks  . Number of children: 2  . Years of education: 6  . Highest education level: Bachelor's degree (e.g., BA, AB, BS)  Occupational History  . 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 Smoking status: Never Smoker  . Smokeless tobacco: Never Used  Substance and Sexual Activity  . Alcohol use: No  . Drug use: No  . Sexual activity: Never  Other Topics Concern  . Not on file  Social History Narrative  . Not on file   Social Determinants of Health   Financial Resource Strain:   . Difficulty of Paying Living Expenses: Not on file  Food Insecurity:   . Worried About Charity fundraiser in the Last Year: Not on file  . Ran Out of Food in the Last Year: Not on file  Transportation Needs:   . Lack of Transportation (Medical): Not on file  . Lack of Transportation (Non-Medical): Not on file  Physical Activity:   . Days of Exercise per Week: Not on file  . Minutes of Exercise per Session: Not on file  Stress:   . Feeling of  Stress : Not on file  Social Connections:   . Frequency of Communication with Friends and Family: Not on file  . Frequency of Social Gatherings with Friends and Family: Not on file  . Attends Religious Services: Not on file  . Active Member of Clubs or Organizations: Not on file  . Attends Archivist Meetings: Not on file  . Marital Status: Not on file  Intimate Partner Violence:   . Fear of Current or Ex-Partner: Not on file  . Emotionally Abused: Not on  file  . Physically Abused: Not on file  . Sexually Abused: Not on file    Family History  Problem Relation Age of Onset  . Parkinson's disease Mother   . Bladder Cancer Father   . Lung cancer Father      Current Outpatient Medications:  .  acetaminophen (TYLENOL) 500 MG tablet, Take 500 mg by mouth daily as needed for moderate pain or headache. , Disp: , Rfl:  .  amLODipine (NORVASC) 10 MG tablet, Take 10 mg by mouth daily., Disp: , Rfl:  .  apixaban (ELIQUIS) 5 MG TABS tablet, Take 1 tablet (5 mg total) by mouth 2 (two) times daily., Disp: 60 tablet, Rfl: 6 .  aspirin EC 81 MG tablet, Take 1 tablet (81 mg total) by mouth daily., Disp: 150 tablet, Rfl: 2 .  Calcium Carb-Cholecalciferol (CALCIUM 600/VITAMIN D3 PO), Take 1 tablet by mouth daily., Disp: , Rfl:  .  Cholecalciferol (VITAMIN D3) 10 MCG (400 UNIT) CAPS, Take 1 capsule by mouth daily., Disp: , Rfl:  .  IBRANCE 75 MG tablet, TAKE 1 TABLET (75 MG TOTAL) BY MOUTH DAILY. TAKE FOR 21 DAYS ON, 7 DAYS OFF, REPEAT EVERY 28 DAYS., Disp: 21 tablet, Rfl: 3 .  ibuprofen (ADVIL,MOTRIN) 400 MG tablet, Take 400 mg by mouth 3 (three) times daily as needed., Disp: , Rfl:  .  letrozole (FEMARA) 2.5 MG tablet, Take 1 tablet (2.5 mg total) by mouth daily., Disp: 90 tablet, Rfl: 3 .  lidocaine-prilocaine (EMLA) cream, Apply 1 application topically as needed (port access)., Disp: 30 g, Rfl: 2 .  PREVIDENT 5000 BOOSTER PLUS 1.1 % PSTE, Place 1 application onto teeth 2 (two) times daily. , Disp: , Rfl:  .  losartan (COZAAR) 25 MG tablet, Take 25 mg by mouth daily., Disp: , Rfl:  .  prochlorperazine (COMPAZINE) 10 MG tablet, Take 1 tablet (10 mg total) by mouth every 6 (six) hours as needed (Nausea or vomiting)., Disp: 30 tablet, Rfl: 2  Physical exam:  Vitals:   05/18/19 0927  BP: (!) 158/69  Pulse: 63  Temp: 98 F (36.7 C)  TempSrc: Tympanic  Weight: 203 lb (92.1 kg)  Height: '5\' 2"'$  (1.575 m)   Physical Exam Constitutional:      General:  She is not in acute distress. HENT:     Head: Normocephalic and atraumatic.  Eyes:     Pupils: Pupils are equal, round, and reactive to light.  Cardiovascular:     Rate and Rhythm: Normal rate and regular rhythm.     Heart sounds: Normal heart sounds.  Pulmonary:     Effort: Pulmonary effort is normal.     Breath sounds: Normal breath sounds.  Abdominal:     General: Bowel sounds are normal.     Palpations: Abdomen is soft.  Musculoskeletal:     Cervical back: Normal range of motion.  Skin:    General: Skin is warm and dry.  Neurological:  Mental Status: She is alert and oriented to person, place, and time.      CMP Latest Ref Rng & Units 05/18/2019  Glucose 70 - 99 mg/dL 124(H)  BUN 8 - 23 mg/dL 12  Creatinine 0.44 - 1.00 mg/dL 0.88  Sodium 135 - 145 mmol/L 136  Potassium 3.5 - 5.1 mmol/L 3.8  Chloride 98 - 111 mmol/L 102  CO2 22 - 32 mmol/L 23  Calcium 8.9 - 10.3 mg/dL 9.7  Total Protein 6.5 - 8.1 g/dL 7.5  Total Bilirubin 0.3 - 1.2 mg/dL 1.1  Alkaline Phos 38 - 126 U/L 61  AST 15 - 41 U/L 30  ALT 0 - 44 U/L 34   CBC Latest Ref Rng & Units 05/18/2019  WBC 4.0 - 10.5 K/uL 3.0(L)  Hemoglobin 12.0 - 15.0 g/dL 13.2  Hematocrit 36.0 - 46.0 % 38.2  Platelets 150 - 400 K/uL 262    No images are attached to the encounter.  CT Chest W Contrast  Result Date: 05/15/2019 CLINICAL DATA:  restaging metastatic breast cancer. EXAM: CT CHEST, ABDOMEN, AND PELVIS WITH CONTRAST TECHNIQUE: Multidetector CT imaging of the chest, abdomen and pelvis was performed following the standard protocol during bolus administration of intravenous contrast. CONTRAST:  167m OMNIPAQUE IOHEXOL 300 MG/ML  SOLN COMPARISON:  11/11/2018. FINDINGS: CT CHEST FINDINGS Cardiovascular: Normal heart size. Aortic atherosclerosis. Lad, RCA and left circumflex coronary artery calcifications. Mediastinum/Nodes: No enlarged mediastinal, hilar, or axillary lymph nodes. Thyroid gland, trachea, and esophagus demonstrate  no significant findings. Lungs/Pleura: No pleural effusion. 2 mm subpleural nodule is identified within the lateral left lower lobe, image 85/4. Unchanged from previous exam. No new or suspicious lung nodules. Musculoskeletal: Diffuse sclerotic bone metastases are again identified. The appearance is similar to previous exam. CT ABDOMEN PELVIS FINDINGS Hepatobiliary: 6 mm low attenuation structure in left lobe of liver is unchanged and remains too small to reliably characterize. No suspicious liver lesions. Gallbladder unremarkable. No biliary dilatation. Pancreas: Unremarkable. No pancreatic ductal dilatation or surrounding inflammatory changes. Spleen: Normal in size without focal abnormality. Adrenals/Urinary Tract: Normal appearance of the adrenal glands. The kidneys are unremarkable. The urinary bladder is negative. Stomach/Bowel: Stomach is within normal limits. Appendix appears normal. No evidence of bowel wall thickening, distention, or inflammatory changes. Distal colonic diverticulosis identified without acute inflammation. Vascular/Lymphatic: Aortic atherosclerosis. No aneurysm. No abdominopelvic adenopathy. Reproductive: Status post hysterectomy. No adnexal masses. Other: No free fluid or fluid collections. Musculoskeletal: Unchanged diffuse sclerotic bone metastases. IMPRESSION: 1. Stable diffuse sclerotic bone metastases. 2. No new or progressive disease identified. 3. Aortic atherosclerosis and 3 vessel coronary artery calcifications. Aortic Atherosclerosis (ICD10-I70.0). Electronically Signed   By: TKerby MoorsM.D.   On: 05/15/2019 10:19   NM Bone Scan Whole Body  Result Date: 05/16/2019 CLINICAL DATA:  Left breast cancer. EXAM: NUCLEAR MEDICINE WHOLE BODY BONE SCAN TECHNIQUE: Whole body anterior and posterior images were obtained approximately 3 hours after intravenous injection of radiopharmaceutical. RADIOPHARMACEUTICALS:  23.4 mCi Technetium-942mDP IV COMPARISON:  CT 05/15/2019.  Bone scan  07/22/2018. FINDINGS: Bilateral renal function and excretion. Faint areas of increased activity about the skull entire spine, bilateral ribs, and pelvis again noted. This is unchanged from prior bone scan and again is consistent with metastatic disease. Again bone scan significantly underestimates the amount of metastatic disease present as demonstrated by CT of the chest, abdomen, and pelvis of 05/15/2019. Increased activity about both shoulders, knees, and ankles consistent with degenerative change. IMPRESSION: Diffuse faint areas of increased activity again  noted. This is unchanged from prior bone scan 07/22/2018 and again is consistent metastatic disease. Again bone scan significantly underestimates amount of metastatic disease present as demonstrated by CT of the chest, abdomen, and pelvis of 05/15/2019. Electronically Signed   By: Marcello Moores  Register   On: 05/16/2019 05:23   CT Abdomen Pelvis W Contrast  Result Date: 05/15/2019 CLINICAL DATA:  restaging metastatic breast cancer. EXAM: CT CHEST, ABDOMEN, AND PELVIS WITH CONTRAST TECHNIQUE: Multidetector CT imaging of the chest, abdomen and pelvis was performed following the standard protocol during bolus administration of intravenous contrast. CONTRAST:  183m OMNIPAQUE IOHEXOL 300 MG/ML  SOLN COMPARISON:  11/11/2018. FINDINGS: CT CHEST FINDINGS Cardiovascular: Normal heart size. Aortic atherosclerosis. Lad, RCA and left circumflex coronary artery calcifications. Mediastinum/Nodes: No enlarged mediastinal, hilar, or axillary lymph nodes. Thyroid gland, trachea, and esophagus demonstrate no significant findings. Lungs/Pleura: No pleural effusion. 2 mm subpleural nodule is identified within the lateral left lower lobe, image 85/4. Unchanged from previous exam. No new or suspicious lung nodules. Musculoskeletal: Diffuse sclerotic bone metastases are again identified. The appearance is similar to previous exam. CT ABDOMEN PELVIS FINDINGS Hepatobiliary: 6 mm low  attenuation structure in left lobe of liver is unchanged and remains too small to reliably characterize. No suspicious liver lesions. Gallbladder unremarkable. No biliary dilatation. Pancreas: Unremarkable. No pancreatic ductal dilatation or surrounding inflammatory changes. Spleen: Normal in size without focal abnormality. Adrenals/Urinary Tract: Normal appearance of the adrenal glands. The kidneys are unremarkable. The urinary bladder is negative. Stomach/Bowel: Stomach is within normal limits. Appendix appears normal. No evidence of bowel wall thickening, distention, or inflammatory changes. Distal colonic diverticulosis identified without acute inflammation. Vascular/Lymphatic: Aortic atherosclerosis. No aneurysm. No abdominopelvic adenopathy. Reproductive: Status post hysterectomy. No adnexal masses. Other: No free fluid or fluid collections. Musculoskeletal: Unchanged diffuse sclerotic bone metastases. IMPRESSION: 1. Stable diffuse sclerotic bone metastases. 2. No new or progressive disease identified. 3. Aortic atherosclerosis and 3 vessel coronary artery calcifications. Aortic Atherosclerosis (ICD10-I70.0). Electronically Signed   By: TKerby MoorsM.D.   On: 05/15/2019 10:19     Assessment and plan- Patient is a 79y.o. female with metastatic breast cancer and bone only metastases currently on letrozole and Ibrance. She is here for routine follow-up of breast cancer and to discuss results of her CT scan  I personally reviewed CT chest abdomen pelvis images independently as well as bone scan and discussed findings with the patient. Overall patient has stable bone metastases and no evidence of disease progression. She has done very well on letrozole and Ibrance for the last 2 years without any significant progression. She will continue taking Ibrance 75 mg 3 weeks on 1 week off along with letrozole daily.  Patient has been getting Zometa every 3 months and I may have to see hold her Zometa in March  given her upcoming dental appointment and possibility of getting root canal treatments  [facility] is greater than [facility]. She can continue same dose 75 mg   Visit Diagnosis 1. High risk medication use   2. Bone metastases ([place]   3. Malignant neoplasm of right breast in female, estrogen receptor positive, unspecified site of breast ([place]   4. Drug-induced neutropenia ([place]      Dr. ARanda Evens MD, MPH [id number]/08/2019 1:08 PM

## 2019-05-29 ENCOUNTER — Telehealth: Payer: Self-pay

## 2019-05-29 NOTE — Telephone Encounter (Signed)
Dr. Janese Banks, patient is on her last week of Ibrance and next week is her week off.

## 2019-05-29 NOTE — Telephone Encounter (Signed)
She can restart ibrance after 2 weeks instead of 1 week

## 2019-05-29 NOTE — Telephone Encounter (Signed)
Ok to proceed with tooth extraction. We will hold her zometa in march. Ibrance depending on when she is in her cycle- she can delay the start of the drug by 2 weeks

## 2019-05-29 NOTE — Telephone Encounter (Signed)
Patient called wanting you to know that last week she went to her dentist and had some fillings done. She is currently on Zometa and Ibrance and her dentist would want to know if it was okay for her to have a tooth extraction this Thursday (06/01/19)? Please advise.

## 2019-05-30 ENCOUNTER — Other Ambulatory Visit: Payer: Self-pay | Admitting: Oncology

## 2019-05-30 DIAGNOSIS — C50912 Malignant neoplasm of unspecified site of left female breast: Secondary | ICD-10-CM

## 2019-05-30 NOTE — Telephone Encounter (Signed)
Patient was informed about Dr. Elroy Channel recommendations and she agreed. Please read below.

## 2019-06-06 MED FILL — IBRANCE 75 MG TABS: 75 | 28 days supply | Qty: 21 | Fill #0

## 2019-06-14 ENCOUNTER — Telehealth: Payer: Self-pay | Admitting: *Deleted

## 2019-06-14 DIAGNOSIS — C7951 Secondary malignant neoplasm of bone: Secondary | ICD-10-CM

## 2019-06-14 MED ORDER — LETROZOLE 2.5 MG PO TABS: 2.5000 mg | ORAL_TABLET | Freq: Every day | ORAL | 3 refills | Status: DC

## 2019-06-14 NOTE — Telephone Encounter (Signed)
Called pt. To let her know that rx was sent in for letrozole. It is 1 year worth of rx. She thanked me for calling it in

## 2019-06-23 ENCOUNTER — Ambulatory Visit (INDEPENDENT_AMBULATORY_CARE_PROVIDER_SITE_OTHER): Payer: Medicare Other

## 2019-06-23 ENCOUNTER — Ambulatory Visit (INDEPENDENT_AMBULATORY_CARE_PROVIDER_SITE_OTHER): Payer: Medicare Other | Admitting: Nurse Practitioner

## 2019-06-23 ENCOUNTER — Ambulatory Visit (INDEPENDENT_AMBULATORY_CARE_PROVIDER_SITE_OTHER): Payer: Medicare Other | Admitting: Vascular Surgery

## 2019-06-23 ENCOUNTER — Other Ambulatory Visit: Payer: Self-pay

## 2019-06-23 ENCOUNTER — Encounter (INDEPENDENT_AMBULATORY_CARE_PROVIDER_SITE_OTHER): Payer: Self-pay | Admitting: Nurse Practitioner

## 2019-06-23 VITALS — BP 165/78 | HR 88 | Resp 16 | Wt 204.0 lb

## 2019-06-23 DIAGNOSIS — I739 Peripheral vascular disease, unspecified: Secondary | ICD-10-CM

## 2019-06-23 DIAGNOSIS — I10 Essential (primary) hypertension: Secondary | ICD-10-CM | POA: Diagnosis not present

## 2019-06-23 DIAGNOSIS — C801 Malignant (primary) neoplasm, unspecified: Secondary | ICD-10-CM | POA: Diagnosis not present

## 2019-06-23 NOTE — Progress Notes (Signed)
SUBJECTIVE:  Patient ID: Victoria Steele, female    DOB: 1940/11/27, 79 y.o.   MRN: [id number] Chief Complaint  Patient presents with  . Follow-up    ultrasound follow up    HPI  Victoria Steele is a 79 y.o. female The patient returns to the office for followup and review of the noninvasive studies. There have been no interval changes in lower extremity symptoms. No interval shortening of the patient's claudication distance or development of rest pain symptoms. No new ulcers or wounds have occurred since the last visit.  There have been no significant changes to the patient's overall health care.  The patient denies amaurosis fugax or recent TIA symptoms. There are no recent neurological changes noted. The patient denies history of DVT, PE or superficial thrombophlebitis. The patient denies recent episodes of angina or shortness of breath.   ABI Rt=0.74 and Lt=0.79  (previous ABI's Rt=0.95 and Lt=0.80) Duplex ultrasound of the right tibial arteries with biphasic/traphsic waveforms, the left tibial arteries have biphasic waveforms.    Past Medical History:  Diagnosis Date  . Breast cancer ([place])   . Cancer ([place])    left breast  . DVT (deep venous thrombosis) ([place])    2019  . Hypertension   . Port catheter in place 09/14/2016   Placed 09/10/2016 RT chest.    Past Surgical History:  Procedure Laterality Date  . ABDOMINAL HYSTERECTOMY    . APPENDECTOMY    . BREAST BIOPSY    . CYST EXCISION Right 09/2016   Buttocks  . IVC FILTER REMOVAL N/A 01/17/2018   Procedure: IVC FILTER REMOVAL;  Surgeon: Algernon Huxley, MD;  Location: [facility];  Service: Cardiovascular;  Laterality: N/A;  . LOWER EXTREMITY ANGIOGRAPHY Right 11/11/2017   Procedure: LOWER EXTREMITY ANGIOGRAPHY;  Surgeon: Katha Cabal, MD;  Location: [facility];  Service: Cardiovascular;  Laterality: Right;  . PERIPHERAL VASCULAR THROMBECTOMY Right 08/30/2017   Procedure: PERIPHERAL VASCULAR  THROMBECTOMY;  Surgeon: Algernon Huxley, MD;  Location: [facility];  Service: Cardiovascular;  Laterality: Right;  . PORTACATH PLACEMENT Right 09/10/2016   Procedure: INSERTION PORT-A-CATH;  Surgeon: Jules Husbands, MD;  Location: [facility];  Service: General;  Laterality: Right;    Social History   Socioeconomic History  . Marital status: Married    Spouse name: Jeneen Rinks  . Number of children: 2  . Years of education: 6  . Highest education level: Bachelor's degree (e.g., BA, AB, BS)  Occupational History  . 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 Smoking status: Never Smoker  . Smokeless tobacco: Never Used  Substance and Sexual Activity  . Alcohol use: No  . Drug use: No  . Sexual activity: Never  Other Topics Concern  . Not on file  Social History Narrative  . Not on file   Social Determinants of Health   Financial Resource Strain:   . Difficulty of Paying Living Expenses:   Food Insecurity:   . Worried About Charity fundraiser in the Last Year:   . Arboriculturist in the Last Year:   Transportation Needs:   . Film/video editor (Medical):   Marland Kitchen Lack of Transportation (Non-Medical):   Physical Activity:   . Days of Exercise per Week:   . Minutes of Exercise per Session:   Stress:   . Feeling of Stress :   Social Connections:   .  Frequency of Communication with Friends and Family:   . Frequency of Social Gatherings with Friends and Family:   . Attends Religious Services:   . Active Member of Clubs or Organizations:   . Attends Archivist Meetings:   Marland Kitchen Marital Status:   Intimate Partner Violence:   . Fear of Current or Ex-Partner:   . Emotionally Abused:   Marland Kitchen Physically Abused:   . Sexually Abused:     Family History  Problem Relation Age of Onset  . Parkinson's disease Mother   . Bladder Cancer Father   . Lung cancer Father     No Known Allergies   Review of Systems   Review of Systems:  Negative Unless Checked Constitutional: [] Weight loss  [] Fever  [] Chills Cardiac: [] Chest pain   []  Atrial Fibrillation  [] Palpitations   [] Shortness of breath when laying flat   [] Shortness of breath with exertion. [] Shortness of breath at rest Vascular:  [] Pain in legs with walking   [] Pain in legs with standing [] Pain in legs when laying flat   [] Claudication    [] Pain in feet when laying flat    [x] History of DVT   [] Phlebitis   [] Swelling in legs   [] Varicose veins   [] Non-healing ulcers Pulmonary:   [] Uses home oxygen   [] Productive cough   [] Hemoptysis   [] Wheeze  [] COPD   [] Asthma Neurologic:  [] Dizziness   [] Seizures  [] Blackouts [] History of stroke   [] History of TIA  [] Aphasia   [] Temporary Blindness   [] Weakness or numbness in arm   [] Weakness or numbness in leg Musculoskeletal:   [] Joint swelling   [] Joint pain   [] Low back pain  []  History of Knee Replacement [] Arthritis [] back Surgeries  []  Spinal Stenosis    Hematologic:  [] Easy bruising  [] Easy bleeding   [x] Hypercoagulable state   [] Anemic Gastrointestinal:  [] Diarrhea   [] Vomiting  [] Gastroesophageal reflux/heartburn   [] Difficulty swallowing. [] Abdominal pain Genitourinary:  [] Chronic kidney disease   [] Difficult urination  [] Anuric   [] Blood in urine [] Frequent urination  [] Burning with urination   [] Hematuria Skin:  [] Rashes   [] Ulcers [] Wounds Psychological:  [] History of anxiety   []  History of major depression  []  Memory Difficulties      OBJECTIVE:   Physical Exam  BP (!) 165/78 (BP Location: Right Arm)   Pulse 88   Resp 16   Wt 204 lb (92.5 kg)   BMI 37.31 kg/m   Gen: WD/WN, NAD Head: [place]/AT, No temporalis wasting.  Ear/Nose/Throat: Hearing grossly intact, nares w/o erythema or drainage Eyes: PER, EOMI, sclera nonicteric.  Neck: Supple, no masses.  No JVD.  Pulmonary:  Good air movement, no use of accessory muscles.  Cardiac: RRR Vascular:  Scattered spider varicosities.  Good capillary refill bilateral  toes Vessel Right Left  Radial Palpable Palpable  Dorsalis Pedis Palpable Palpable  Posterior Tibial Palpable Palpable   Gastrointestinal: soft, non-distended. No guarding/no peritoneal signs.  Musculoskeletal: M/S 5/5 throughout.  No deformity or atrophy.  Neurologic: Pain and light touch intact in extremities.  Symmetrical.  Speech is fluent. Motor exam as listed above. Psychiatric: Judgment intact, Mood & affect appropriate for pt's clinical situation. Dermatologic: No Venous rashes. No Ulcers Noted.  No changes consistent with cellulitis. Lymph : No Cervical lymphadenopathy, no lichenification or skin changes of chronic lymphedema.       ASSESSMENT AND PLAN:  1. PAD (peripheral artery disease) (HCC)  Recommend:  The patient has evidence of atherosclerosis of the lower extremities with claudication.  The patient does not voice lifestyle limiting changes at this point in time.  Noninvasive studies do not suggest clinically significant change.  No invasive studies, angiography or surgery at this time The patient should continue walking and begin a more formal exercise program.  The patient should continue antiplatelet therapy and aggressive treatment of the lipid abnormalities  No changes in the patient's medications at this time  The patient should continue wearing graduated compression socks 10-15 mmHg strength to control the mild edema.    2. Essential hypertension Continue antihypertensive medications as already ordered, these medications have been reviewed and there are no changes at this time.   3. Cancer [facility]) The patient has had a previous history of DVT.  She is currently still undergoing treatment for breast cancer.  Due to the fact that cancer can sometimes cause a hypercoagulable state, I have advised the patient to continue Eliquis for now as long as there is no negative effects.   Current Outpatient Medications on File Prior to Visit  Medication Sig Dispense  Refill  . acetaminophen (TYLENOL) 500 MG tablet Take 500 mg by mouth daily as needed for moderate pain or headache.     Marland Kitchen amLODipine (NORVASC) 10 MG tablet Take 10 mg by mouth daily.    Marland Kitchen aspirin EC 81 MG tablet Take 1 tablet (81 mg total) by mouth daily. 150 tablet 2  . Calcium Carb-Cholecalciferol (CALCIUM 600/VITAMIN D3 PO) Take 1 tablet by mouth daily.    . Cholecalciferol (VITAMIN D3) 10 MCG (400 UNIT) CAPS Take 1 capsule by mouth daily.    Leslee Home 75 MG tablet TAKE 1 TABLET (75 MG TOTAL) BY MOUTH DAILY. TAKE FOR 21 DAYS ON, 7 DAYS OFF, REPEAT EVERY 28 DAYS. 21 tablet 3  . ibuprofen (ADVIL,MOTRIN) 400 MG tablet Take 400 mg by mouth 3 (three) times daily as needed.    Marland Kitchen letrozole (FEMARA) 2.5 MG tablet Take 1 tablet (2.5 mg total) by mouth daily. 90 tablet 3  . lidocaine-prilocaine (EMLA) cream Apply 1 application topically as needed (port access). 30 g 2  . losartan (COZAAR) 25 MG tablet Take 25 mg by mouth daily.    Marland Kitchen PREVIDENT 5000 BOOSTER PLUS 1.1 % PSTE Place 1 application onto teeth 2 (two) times daily.     . prochlorperazine (COMPAZINE) 10 MG tablet Take 1 tablet (10 mg total) by mouth every 6 (six) hours as needed (Nausea or vomiting). 30 tablet 2  . apixaban (ELIQUIS) 5 MG TABS tablet Take 1 tablet (5 mg total) by mouth 2 (two) times daily. 60 tablet 6  . [DISCONTINUED] letrozole (FEMARA) 2.5 MG tablet Take 1 tablet by mouth once daily 90 tablet 0   No current facility-administered medications on file prior to visit.    There are no Patient Instructions on file for this visit. No follow-ups on file.   Kris Hartmann, NP  This note was completed with Sales executive.  Any errors are purely unintentional.

## 2019-07-10 ENCOUNTER — Telehealth: Payer: Self-pay | Admitting: *Deleted

## 2019-07-10 NOTE — Telephone Encounter (Signed)
Pt called asking about Leslee Home she has not rcvd it. I called alyson the pharmacist said that [facility] had been trying to call her and no response. alyson gave me the number for [facility] pharmacy [phone]. I called pt back and she will call them and they should be able to ship in few days. Pt agreeable

## 2019-07-11 MED FILL — IBRANCE 75 MG TABS: 75 | 28 days supply | Qty: 21 | Fill #1

## 2019-07-17 ENCOUNTER — Inpatient Hospital Stay: Payer: Medicare Other | Attending: Oncology

## 2019-07-17 ENCOUNTER — Inpatient Hospital Stay: Payer: Medicare Other | Admitting: Oncology

## 2019-07-17 ENCOUNTER — Other Ambulatory Visit: Payer: Self-pay

## 2019-07-17 ENCOUNTER — Encounter: Payer: Self-pay | Admitting: Oncology

## 2019-07-17 VITALS — BP 85/67 | HR 70 | Temp 97.8°F | Ht 62.0 in | Wt 207.0 lb

## 2019-07-17 DIAGNOSIS — D702 Other drug-induced agranulocytosis: Secondary | ICD-10-CM | POA: Diagnosis not present

## 2019-07-17 DIAGNOSIS — C50911 Malignant neoplasm of unspecified site of right female breast: Secondary | ICD-10-CM

## 2019-07-17 DIAGNOSIS — Z79899 Other long term (current) drug therapy: Secondary | ICD-10-CM

## 2019-07-17 DIAGNOSIS — C7951 Secondary malignant neoplasm of bone: Secondary | ICD-10-CM

## 2019-07-17 DIAGNOSIS — I10 Essential (primary) hypertension: Secondary | ICD-10-CM | POA: Insufficient documentation

## 2019-07-17 DIAGNOSIS — Z79811 Long term (current) use of aromatase inhibitors: Secondary | ICD-10-CM | POA: Insufficient documentation

## 2019-07-17 DIAGNOSIS — C792 Secondary malignant neoplasm of skin: Secondary | ICD-10-CM | POA: Insufficient documentation

## 2019-07-17 DIAGNOSIS — Z170 Estrogen receptor positive status [ER+]: Secondary | ICD-10-CM

## 2019-07-17 DIAGNOSIS — C50112 Malignant neoplasm of central portion of left female breast: Secondary | ICD-10-CM | POA: Diagnosis present

## 2019-07-17 LAB — CBC WITH DIFFERENTIAL/PLATELET
Abs Immature Granulocytes: 0.03 10*3/uL (ref 0.00–0.07)
Basophils Absolute: 0.1 10*3/uL (ref 0.0–0.1)
Basophils Relative: 2 %
Eosinophils Absolute: 0.1 10*3/uL (ref 0.0–0.5)
Eosinophils Relative: 4 %
HCT: 37.9 % (ref 36.0–46.0)
Hemoglobin: 13.3 g/dL (ref 12.0–15.0)
Immature Granulocytes: 1 %
Lymphocytes Relative: 39 %
Lymphs Abs: 1.1 10*3/uL (ref 0.7–4.0)
MCH: 36.4 pg — ABNORMAL HIGH (ref 26.0–34.0)
MCHC: 35.1 g/dL (ref 30.0–36.0)
MCV: 103.8 fL — ABNORMAL HIGH (ref 80.0–100.0)
Monocytes Absolute: 0.4 10*3/uL (ref 0.1–1.0)
Monocytes Relative: 13 %
Neutro Abs: 1.1 10*3/uL — ABNORMAL LOW (ref 1.7–7.7)
Neutrophils Relative %: 41 %
Platelets: 222 10*3/uL (ref 150–400)
RBC: 3.65 MIL/uL — ABNORMAL LOW (ref 3.87–5.11)
RDW: 14.8 % (ref 11.5–15.5)
WBC: 2.8 10*3/uL — ABNORMAL LOW (ref 4.0–10.5)
nRBC: 0 % (ref 0.0–0.2)

## 2019-07-17 LAB — COMPREHENSIVE METABOLIC PANEL
ALT: 34 U/L (ref 0–44)
AST: 30 U/L (ref 15–41)
Albumin: 4.2 g/dL (ref 3.5–5.0)
Alkaline Phosphatase: 60 U/L (ref 38–126)
Anion gap: 11 (ref 5–15)
BUN: 11 mg/dL (ref 8–23)
CO2: 24 mmol/L (ref 22–32)
Calcium: 9.7 mg/dL (ref 8.9–10.3)
Chloride: 104 mmol/L (ref 98–111)
Creatinine, Ser: 0.85 mg/dL (ref 0.44–1.00)
GFR calc Af Amer: >60 mL/min (ref >60)
GFR calc non Af Amer: >60 mL/min (ref >60)
Glucose, Bld: 127 mg/dL — ABNORMAL HIGH (ref 70–99)
Potassium: 3.8 mmol/L (ref 3.5–5.1)
Sodium: 139 mmol/L (ref 135–145)
Total Bilirubin: 1 mg/dL (ref 0.3–1.2)
Total Protein: 7.5 g/dL (ref 6.5–8.1)

## 2019-07-17 NOTE — Progress Notes (Signed)
Patient stated that she had been doing well with no complaints. 

## 2019-07-18 LAB — CANCER ANTIGEN 27.29: CA 27.29: 75.6 U/mL — ABNORMAL HIGH (ref 0.0–38.6)

## 2019-07-20 NOTE — Progress Notes (Signed)
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 Patient is a 79 year old female with no significant comorbidities who noticed left breast mass about 6-9 months ago. She thought it would go awaybut it continued to increase in size and began to involve her skin. She also noted tenderness to palpation as well as intermittent sharp tingling pain. She was seen by Dr. Adora Fridge on 08/25/2016 and patient had a biopsy of her skin in his office which showed:DIAGNOSIS:  A. LEFT BREAST SKIN; EXCISION:  - INVASIVE CARCINOMA MORPHOLOGICALLY CONSISTENT WITH MAMMARY ORIGIN  INVOLVING THE DERMIS.   BREAST BIOMARKER TESTS  Estrogen Receptor (ER) Status: POSITIVE, >90%  Progesterone Receptor (PgR) Status: POSITIVE, >70%  Her2 negative  2. Patient underwent bilateral diagnostic mammogram on 08/31/2016 showed:IMPRESSION: 1. Known left breast cancer, retroareolar with probable extension to the nipple, measuring 4.3 cm greatest dimension by ultrasound, with associated architectural distortion and associated left nipple retraction, and with associated diffuse skin thickening on the left. 2. No enlarged or morphologically abnormal lymph nodes are seen in the left axilla by ultrasound. 3. No evidence of malignancy within the right breast.  3. Patient was also complaining of some perirectal pain and underwent CT abdomen pelvis with contrast on 09/02/2016 which showed: IMPRESSION: Left colonic diverticulosis. No active diverticulitis.Fatty liver. No acute findings or evidence of  metastatic disease in the abdomen or pelvis. Diffuse bony sclerotic metastases.  4. Other than that her breast pain patient overall feels well. Denies any unintentional weight loss. She continues to be active and care for her great-grandchildren. She has had a hysterectomy in the past. No family history of breast or ovarian cancer.  5. Given that she had inflammatory breast cancer- plan was to give 4 cycles of dose dense AC followed by possible mastectomy and treat bone mets with AI + ibrance  6. PET CT scan on 09/11/16 showed: IMPRESSION: 1. Low-grade but abnormal hypermetabolic activity associated with the cutaneous and sub areolar glandular tissues of the left breast, compatible with malignancy, representative SUV 3.9 compared to contralateral normal side 1.4. 2. Diffuse sclerotic osseous metastatic disease with low-grade hypermetabolic activity, a representative SUV along the left sacrum 5.4. 3. Other imaging findings of potential clinical significance: Aortic Atherosclerosis (ICD10-I70.0). Descending and sigmoid colon Diverticulosis.  7. Baseline MUGA scan showed EF of 71%. Baseline CA 27.29 elevated at 563.2  8. 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  9. Letrozole and ibrance started in sept 2018. zometa monthly also started. Baseline bone density scan normal  10. ibrance held in oct 2018 after cycle 1 due to Grade 3 neutropenia and thrombocytopenia.cycle 2 restaretd on 02/04/17 at 100 mg. Patient continued to have prolongeed grade 3 neutropenia and dose lowered to 75 mg  11. Patient had extensive RLE DVT in April 2019 and is currently on eliquis. She also underwent venous thrombectomy and thrombolytic therapy and IVC placement by Dr. Lucky Cowboy.She also has peripheral vascular disease secondary to atherosclerosis and underwent stenting with vascular surgery as well   Interval history-tolerating letrozole and Ibrance well so far.  Denies any complaints at  this time  ECOG PS- 1 Pain scale- 0   Review of systems- Review of Systems  Constitutional: Negative for chills, fever, malaise/fatigue and weight loss.  HENT: Negative for congestion, ear discharge and nosebleeds.   Eyes: Negative for blurred vision.  Respiratory: Negative for cough, hemoptysis, sputum production, shortness of breath and wheezing.   Cardiovascular: Negative for chest pain, palpitations, orthopnea and claudication.  Gastrointestinal: Negative for abdominal pain, blood in stool, constipation, diarrhea, heartburn, melena, nausea and vomiting.  Genitourinary: Negative for dysuria, flank pain, frequency, hematuria and urgency.  Musculoskeletal: Negative for back pain, joint pain and myalgias.  Skin: Negative for rash.  Neurological: Negative for dizziness, tingling, focal weakness, seizures, weakness and headaches.  Endo/Heme/Allergies: Does not bruise/bleed easily.  Psychiatric/Behavioral: Negative for depression and suicidal ideas. The patient does not have insomnia.       No Known Allergies   Past Medical History:  Diagnosis Date  . Breast cancer ([place])   . Cancer ([place])    left breast  . DVT (deep venous thrombosis) ([place])    2019  . Hypertension   . Port catheter in place 09/14/2016   Placed 09/10/2016 RT chest.     Past Surgical History:  Procedure Laterality Date  . ABDOMINAL HYSTERECTOMY    . APPENDECTOMY    . BREAST BIOPSY    . CYST EXCISION Right 09/2016   Buttocks  . IVC FILTER REMOVAL N/A 01/17/2018   Procedure: IVC FILTER REMOVAL;  Surgeon: Algernon Huxley, MD;  Location: [place] [facility];  Service: Cardiovascular;  Laterality: N/A;  . LOWER EXTREMITY ANGIOGRAPHY Right 11/11/2017   Procedure: LOWER EXTREMITY ANGIOGRAPHY;  Surgeon: Katha Cabal, MD;  Location: [place] [facility];  Service: Cardiovascular;  Laterality: Right;  . PERIPHERAL VASCULAR THROMBECTOMY Right 08/30/2017   Procedure: PERIPHERAL VASCULAR THROMBECTOMY;  Surgeon: Algernon Huxley, MD;  Location: [place] [facility];  Service: Cardiovascular;  Laterality: Right;  . PORTACATH PLACEMENT Right 09/10/2016   Procedure: INSERTION PORT-A-CATH;  Surgeon: Jules Husbands, MD;  Location: [facility];  Service: General;  Laterality: Right;    Social History   Socioeconomic History  . Marital status: Married    Spouse name: Jeneen Rinks  . Number of children: 2  . Years of education: 6  . Highest education level: Bachelor's degree (e.g., BA, AB, BS)  Occupational History  . 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 Smoking status: Never Smoker  . Smokeless tobacco: Never Used  Substance and Sexual Activity  . Alcohol use: No  . Drug use: No  . Sexual activity: Never  Other Topics Concern  . Not on file  Social History Narrative  . Not on file   Social Determinants of Health   Financial Resource Strain:   . Difficulty of Paying Living Expenses:   Food Insecurity:   . Worried About Charity fundraiser in the Last Year:   . Arboriculturist in the Last Year:   Transportation Needs:   . Film/video editor (Medical):   Marland Kitchen Lack of Transportation (Non-Medical):   Physical Activity:   . Days of Exercise per Week:   . Minutes of Exercise per Session:   Stress:   . Feeling of Stress :   Social Connections:   . Frequency of Communication with Friends and Family:   . Frequency of Social Gatherings with Friends and Family:   . Attends Religious Services:   . Active Member of  Clubs or Organizations:   . Attends Archivist Meetings:   Marland Kitchen Marital Status:   Intimate Partner Violence:   . Fear of Current or Ex-Partner:   . Emotionally Abused:   Marland Kitchen Physically Abused:   . Sexually Abused:     Family History  Problem Relation Age of Onset  . Parkinson's disease Mother   . Bladder Cancer Father   . Lung cancer Father      Current Outpatient Medications:  .  acetaminophen (TYLENOL) 500 MG tablet, Take 500 mg by  mouth daily as needed for moderate pain or headache. , Disp: , Rfl:  .  amLODipine (NORVASC) 10 MG tablet, Take 10 mg by mouth daily., Disp: , Rfl:  .  apixaban (ELIQUIS) 5 MG TABS tablet, Take 1 tablet (5 mg total) by mouth 2 (two) times daily., Disp: 60 tablet, Rfl: 6 .  aspirin EC 81 MG tablet, Take 1 tablet (81 mg total) by mouth daily., Disp: 150 tablet, Rfl: 2 .  Calcium Carb-Cholecalciferol (CALCIUM 600/VITAMIN D3 PO), Take 1 tablet by mouth daily., Disp: , Rfl:  .  Cholecalciferol (VITAMIN D3) 10 MCG (400 UNIT) CAPS, Take 1 capsule by mouth daily., Disp: , Rfl:  .  IBRANCE 75 MG tablet, TAKE 1 TABLET (75 MG TOTAL) BY MOUTH DAILY. TAKE FOR 21 DAYS ON, 7 DAYS OFF, REPEAT EVERY 28 DAYS., Disp: 21 tablet, Rfl: 3 .  ibuprofen (ADVIL,MOTRIN) 400 MG tablet, Take 400 mg by mouth 3 (three) times daily as needed., Disp: , Rfl:  .  letrozole (FEMARA) 2.5 MG tablet, Take 1 tablet (2.5 mg total) by mouth daily., Disp: 90 tablet, Rfl: 3 .  lidocaine-prilocaine (EMLA) cream, Apply 1 application topically as needed (port access)., Disp: 30 g, Rfl: 2 .  losartan (COZAAR) 25 MG tablet, Take 25 mg by mouth daily., Disp: , Rfl:  .  PREVIDENT 5000 BOOSTER PLUS 1.1 % PSTE, Place 1 application onto teeth 2 (two) times daily. , Disp: , Rfl:  .  prochlorperazine (COMPAZINE) 10 MG tablet, Take 1 tablet (10 mg total) by mouth every 6 (six) hours as needed (Nausea or vomiting). (Patient not taking: Reported on 07/17/2019), Disp: 30 tablet, Rfl: 2  Physical exam:  Vitals:   07/17/19 0947  BP: (!) 85/67  Pulse: 70  Temp: 97.8 F (36.6 C)  TempSrc: Tympanic  Weight: 207 lb (93.9 kg)  Height: '5\' 2"'$  (1.575 m)   Physical Exam HENT:     Head: Normocephalic and atraumatic.  Eyes:     Pupils: Pupils are equal, round, and reactive to light.  Cardiovascular:     Rate and Rhythm: Normal rate and regular rhythm.     Heart sounds: Normal heart sounds.  Pulmonary:     Effort: Pulmonary effort is normal.     Breath  sounds: Normal breath sounds.  Abdominal:     General: Bowel sounds are normal.     Palpations: Abdomen is soft.  Musculoskeletal:     Cervical back: Normal range of motion.  Skin:    General: Skin is warm and dry.  Neurological:     Mental Status: She is alert and oriented to person, place, and time.   There is some induration around the nipple and the areola but no palpable mass in the left breast.  CMP Latest Ref Rng & Units 07/17/2019  Glucose 70 - 99 mg/dL 127(H)  BUN 8 - 23 mg/dL 11  Creatinine 0.44 - 1.00 mg/dL 0.85  Sodium 135 - 145  mmol/L 139  Potassium 3.5 - 5.1 mmol/L 3.8  Chloride 98 - 111 mmol/L 104  CO2 22 - 32 mmol/L 24  Calcium 8.9 - 10.3 mg/dL 9.7  Total Protein 6.5 - 8.1 g/dL 7.5  Total Bilirubin 0.3 - 1.2 mg/dL 1.0  Alkaline Phos 38 - 126 U/L 60  AST 15 - 41 U/L 30  ALT 0 - 44 U/L 34   CBC Latest Ref Rng & Units 07/17/2019  WBC 4.0 - 10.5 K/uL 2.8(L)  Hemoglobin 12.0 - 15.0 g/dL 13.3  Hematocrit 36.0 - 46.0 % 37.9  Platelets 150 - 400 K/uL 222    No images are attached to the encounter.  VAS [place] ABI WITH/WO TBI  Result Date: 06/30/2019 LOWER EXTREMITY DOPPLER STUDY Indications: Claudication, peripheral artery disease, and Right foot              discoloration.  Vascular Interventions: 08/30/17: Right iliac, common femoral, femoral &                         popliteal vein thrombolysis/thrombectomy/PTA. Comparison Study: 06/21/2018 Performing Technologist: Charlane Ferretti RT (R)(VS)  Examination Guidelines: A complete evaluation includes at minimum, Doppler waveform signals and systolic blood pressure reading at the level of bilateral brachial, anterior tibial, and posterior tibial arteries, when vessel segments are accessible. Bilateral testing is considered an integral part of a complete examination. Photoelectric Plethysmograph (PPG) waveforms and toe systolic pressure readings are included as required and additional duplex testing as needed. Limited examinations  for reoccurring indications may be performed as noted.  ABI Findings: +---------+------------------+-----+---------+--------+ Right    Rt Pressure (mmHg)IndexWaveform Comment  +---------+------------------+-----+---------+--------+ Brachial 187                                      +---------+------------------+-----+---------+--------+ ATA      138               0.74 biphasic          +---------+------------------+-----+---------+--------+ PTA      137               0.73 triphasic         +---------+------------------+-----+---------+--------+ Great Toe114               0.61 Abnormal          +---------+------------------+-----+---------+--------+ +---------+------------------+-----+--------+-------+ Left     Lt Pressure (mmHg)IndexWaveformComment +---------+------------------+-----+--------+-------+ Brachial 179                                    +---------+------------------+-----+--------+-------+ ATA      133               0.71 biphasic        +---------+------------------+-----+--------+-------+ PTA      147               0.79 biphasic        +---------+------------------+-----+--------+-------+ Great Toe82                0.44 Abnormal        +---------+------------------+-----+--------+-------+ +-------+-----------+-----------+------------+------------+ ABI/TBIToday's ABIToday's TBIPrevious ABIPrevious TBI +-------+-----------+-----------+------------+------------+ Right  .74        .61        .95         .39          +-------+-----------+-----------+------------+------------+  Left   .79        .44        .80         .47          +-------+-----------+-----------+------------+------------+ Right ABIs appear decreased compared to prior study on 06/21/2018. Left ABIs appear essentially unchanged compared to prior study on 06/21/2018. Bilateral TBI's appear essentially unchanged from the previous exam on 06/21/2018.  Summary: Right: Resting  right ankle-brachial index indicates moderate right lower extremity arterial disease. The right toe-brachial index is abnormal. Left: Resting left ankle-brachial index indicates moderate left lower extremity arterial disease. The left toe-brachial index is abnormal. *See table(s) above for measurements and observations.  Electronically signed by Leotis Pain MD on 06/30/2019 at 8:47:05 AM.   Final      Assessment and plan- Patient is a 79 y.o. female with metastatic breast cancer and bone only metastases currently on letrozole and Ibrance.   She is here for a routine follow-up  Metastatic breast cancer with bone metastases: She will continue letrozole and Ibrance at this time which she has been on close to 2-1/2 years and tolerating it well without any significant side effects.  Tumor markers remain stable and recent scans from February 2021 also did not show any evidence of progressive disease.  Bone metastases: Patient recently underwent dental work but did not require root canal.  I will hold off on giving her Zometa at this time and she will return back in 2 months time to receive her Zometa which will be 6 months from her prior dose.  Neutropenia: Secondary to Ibrance.  ANC greater than 1.  Continue Ibrance at same dose of 75 mg    I will see her back in 2 months with CBC with differential, CMP and CA 27-29 for next dose of Zometa  Patient will also continue Eliquis for history of right lower extremity DVT   Visit Diagnosis 1. High risk medication use   2. Drug-induced neutropenia (HCC)   3. Bone metastases ([place])   4. Malignant neoplasm of right breast in female, estrogen receptor positive, unspecified site of breast [facility])      Dr. Randa Evens, MD, MPH [facility] at [facility] [phone] 07/20/2019 5:11 AM

## 2019-08-07 MED FILL — IBRANCE 75 MG TABS: 75 | 28 days supply | Qty: 21 | Fill #2

## 2019-08-08 ENCOUNTER — Other Ambulatory Visit: Payer: Self-pay | Admitting: *Deleted

## 2019-08-08 MED ORDER — APIXABAN 5 MG PO TABS: 5.0000 mg | ORAL_TABLET | Freq: Two times a day (BID) | ORAL | 6 refills | Status: DC

## 2019-08-29 ENCOUNTER — Telehealth: Payer: Self-pay | Admitting: *Deleted

## 2019-08-29 NOTE — Telephone Encounter (Signed)
Pt called want refill of eliquis. She has 6 refills on it. I called pharmacy and asked them if she still had refills and she does . It is too early to fill it . Will put it on the pharmacy list to refill 2 days. I called pt and got her voicemail and left message that she has refills already on rx but it is too early to get refil lright now. They can fill it in 2 days

## 2019-09-05 MED FILL — IBRANCE 75 MG TABS: 75 | 28 days supply | Qty: 21 | Fill #3

## 2019-09-18 ENCOUNTER — Other Ambulatory Visit: Payer: Self-pay

## 2019-09-18 ENCOUNTER — Inpatient Hospital Stay: Payer: Medicare Other

## 2019-09-18 ENCOUNTER — Inpatient Hospital Stay (HOSPITAL_BASED_OUTPATIENT_CLINIC_OR_DEPARTMENT_OTHER): Payer: Medicare Other | Admitting: Oncology

## 2019-09-18 ENCOUNTER — Inpatient Hospital Stay: Payer: Medicare Other | Attending: Oncology

## 2019-09-18 VITALS — BP 155/65 | HR 79 | Temp 97.2°F | Resp 18 | Wt 203.6 lb

## 2019-09-18 DIAGNOSIS — C50911 Malignant neoplasm of unspecified site of right female breast: Secondary | ICD-10-CM | POA: Insufficient documentation

## 2019-09-18 DIAGNOSIS — C7951 Secondary malignant neoplasm of bone: Secondary | ICD-10-CM

## 2019-09-18 DIAGNOSIS — Z170 Estrogen receptor positive status [ER+]: Secondary | ICD-10-CM | POA: Diagnosis not present

## 2019-09-18 DIAGNOSIS — C50112 Malignant neoplasm of central portion of left female breast: Secondary | ICD-10-CM | POA: Diagnosis not present

## 2019-09-18 DIAGNOSIS — Z7983 Long term (current) use of bisphosphonates: Secondary | ICD-10-CM

## 2019-09-18 DIAGNOSIS — D702 Other drug-induced agranulocytosis: Secondary | ICD-10-CM | POA: Diagnosis not present

## 2019-09-18 DIAGNOSIS — Z79899 Other long term (current) drug therapy: Secondary | ICD-10-CM

## 2019-09-18 LAB — COMPREHENSIVE METABOLIC PANEL
ALT: 32 U/L (ref 0–44)
AST: 32 U/L (ref 15–41)
Albumin: 4.3 g/dL (ref 3.5–5.0)
Alkaline Phosphatase: 61 U/L (ref 38–126)
Anion gap: 10 (ref 5–15)
BUN: 14 mg/dL (ref 8–23)
CO2: 25 mmol/L (ref 22–32)
Calcium: 9.9 mg/dL (ref 8.9–10.3)
Chloride: 103 mmol/L (ref 98–111)
Creatinine, Ser: 0.9 mg/dL (ref 0.44–1.00)
GFR calc Af Amer: >60 mL/min (ref >60)
GFR calc non Af Amer: >60 mL/min (ref >60)
Glucose, Bld: 196 mg/dL — ABNORMAL HIGH (ref 70–99)
Potassium: 3.8 mmol/L (ref 3.5–5.1)
Sodium: 138 mmol/L (ref 135–145)
Total Bilirubin: 0.9 mg/dL (ref 0.3–1.2)
Total Protein: 7.5 g/dL (ref 6.5–8.1)

## 2019-09-18 LAB — CBC WITH DIFFERENTIAL/PLATELET
Abs Immature Granulocytes: 0 10*3/uL (ref 0.00–0.07)
Band Neutrophils: 2 %
Basophils Absolute: 0.1 10*3/uL (ref 0.0–0.1)
Basophils Relative: 5 %
Eosinophils Absolute: 0 10*3/uL (ref 0.0–0.5)
Eosinophils Relative: 1 %
HCT: 37.5 % (ref 36.0–46.0)
Hemoglobin: 13.4 g/dL (ref 12.0–15.0)
Lymphocytes Relative: 30 %
Lymphs Abs: 0.8 10*3/uL (ref 0.7–4.0)
MCH: 36.5 pg — ABNORMAL HIGH (ref 26.0–34.0)
MCHC: 35.7 g/dL (ref 30.0–36.0)
MCV: 102.2 fL — ABNORMAL HIGH (ref 80.0–100.0)
Monocytes Absolute: 0.2 10*3/uL (ref 0.1–1.0)
Monocytes Relative: 8 %
Myelocytes: 1 %
Neutro Abs: 1.5 10*3/uL — ABNORMAL LOW (ref 1.7–7.7)
Neutrophils Relative %: 53 %
Platelets: 305 10*3/uL (ref 150–400)
RBC: 3.67 MIL/uL — ABNORMAL LOW (ref 3.87–5.11)
RDW: 14.6 % (ref 11.5–15.5)
Smear Review: ADEQUATE
WBC: 2.8 10*3/uL — ABNORMAL LOW (ref 4.0–10.5)
nRBC: 0 % (ref 0.0–0.2)

## 2019-09-18 MED ORDER — HEPARIN SOD (PORK) LOCK FLUSH 100 UNIT/ML IV SOLN
INTRAVENOUS | Status: AC
  Filled 2019-09-18: qty 5

## 2019-09-18 MED ORDER — HEPARIN SOD (PORK) LOCK FLUSH 100 UNIT/ML IV SOLN
500.0000 [IU] | Freq: Once | INTRAVENOUS | Status: AC | PRN
  Administered 2019-09-18: 500 [IU]
  Filled 2019-09-18: qty 5

## 2019-09-18 MED ORDER — SODIUM CHLORIDE 0.9% FLUSH
10.0000 mL | Freq: Once | INTRAVENOUS | Status: AC | PRN
  Administered 2019-09-18: 10 mL
  Filled 2019-09-18: qty 10

## 2019-09-18 MED ORDER — SODIUM CHLORIDE 0.9 % IV SOLN
Freq: Once | INTRAVENOUS | Status: AC
  Filled 2019-09-18: qty 250

## 2019-09-18 MED ORDER — ZOLEDRONIC ACID 4 MG/100ML IV SOLN
4.0000 mg | Freq: Once | INTRAVENOUS | Status: AC
  Administered 2019-09-18: 4 mg via INTRAVENOUS
  Filled 2019-09-18: qty 100

## 2019-09-19 LAB — CANCER ANTIGEN 27.29: CA 27.29: 85.4 U/mL — ABNORMAL HIGH (ref 0.0–38.6)

## 2019-09-20 NOTE — Progress Notes (Signed)
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 Patient is a 79 year old female with no significant comorbidities who noticed left breast mass about 6-9 months ago. She thought it would go awaybut it continued to increase in size and began to involve her skin. She also noted tenderness to palpation as well as intermittent sharp tingling pain. She was seen by Dr. Adora Fridge on 08/25/2016 and patient had a biopsy of her skin in his office which showed:DIAGNOSIS:  A. LEFT BREAST SKIN; EXCISION:  - INVASIVE CARCINOMA MORPHOLOGICALLY CONSISTENT WITH MAMMARY ORIGIN  INVOLVING THE DERMIS.   BREAST BIOMARKER TESTS  Estrogen Receptor (ER) Status: POSITIVE, >90%  Progesterone Receptor (PgR) Status: POSITIVE, >70%  Her2 negative  2. Patient underwent bilateral diagnostic mammogram on 08/31/2016 showed:IMPRESSION: 1. Known left breast cancer, retroareolar with probable extension to the nipple, measuring 4.3 cm greatest dimension by ultrasound, with associated architectural distortion and associated left nipple retraction, and with associated diffuse skin thickening on the left. 2. No enlarged or morphologically abnormal lymph nodes are seen in the left axilla by ultrasound. 3. No evidence of malignancy within the right breast.  3. Patient was also complaining of some perirectal pain and underwent CT abdomen pelvis with contrast on 09/02/2016 which showed: IMPRESSION: Left colonic diverticulosis. No active diverticulitis.Fatty liver. No acute findings or evidence of  metastatic disease in the abdomen or pelvis. Diffuse bony sclerotic metastases.  4. Other than that her breast pain patient overall feels well. Denies any unintentional weight loss. She continues to be active and care for her great-grandchildren. She has had a hysterectomy in the past. No family history of breast or ovarian cancer.  5. Given that she had inflammatory breast cancer- plan was to give 4 cycles of dose dense AC followed by possible mastectomy and treat bone mets with AI + ibrance  6. PET CT scan on 09/11/16 showed: IMPRESSION: 1. Low-grade but abnormal hypermetabolic activity associated with the cutaneous and sub areolar glandular tissues of the left breast, compatible with malignancy, representative SUV 3.9 compared to contralateral normal side 1.4. 2. Diffuse sclerotic osseous metastatic disease with low-grade hypermetabolic activity, a representative SUV along the left sacrum 5.4. 3. Other imaging findings of potential clinical significance: Aortic Atherosclerosis (ICD10-I70.0). Descending and sigmoid colon Diverticulosis.  7. Baseline MUGA scan showed EF of 71%. Baseline CA 27.29 elevated at 563.2  8. 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  9. Letrozole and ibrance started in sept 2018. zometa monthly also started. Baseline bone density scan normal  10. ibrance held in oct 2018 after cycle 1 due to Grade 3 neutropenia and thrombocytopenia.cycle 2 restaretd on 02/04/17 at 100 mg. Patient continued to have prolongeed grade 3 neutropenia and dose lowered to 75 mg  11. Patient had extensive RLE DVT in April 2019 and is currently on eliquis. She also underwent venous thrombectomy and thrombolytic therapy and IVC placement by Dr. Lucky Cowboy.She also has peripheral vascular disease secondary to atherosclerosis and underwent stenting with vascular surgery as well   Interval history-patient reports feeling more tired over the last month or so.  Appetite  and  weight are stable.  Denies any new aches and pains anywhere.  She is taking letrozole and Ibrance consistently without missing any doses.  ECOG PS- 1 Pain scale- 0  Review of systems- Review of Systems  Constitutional: Positive for malaise/fatigue. Negative for chills, fever and weight loss.  HENT: Negative for congestion, ear discharge and nosebleeds.   Eyes: Negative for blurred vision.  Respiratory: Negative for cough, hemoptysis, sputum production, shortness of breath and wheezing.   Cardiovascular: Negative for chest pain, palpitations, orthopnea and claudication.  Gastrointestinal: Negative for abdominal pain, blood in stool, constipation, diarrhea, heartburn, melena, nausea and vomiting.  Genitourinary: Negative for dysuria, flank pain, frequency, hematuria and urgency.  Musculoskeletal: Negative for back pain, joint pain and myalgias.  Skin: Negative for rash.  Neurological: Negative for dizziness, tingling, focal weakness, seizures, weakness and headaches.  Endo/Heme/Allergies: Does not bruise/bleed easily.  Psychiatric/Behavioral: Negative for depression and suicidal ideas. The patient does not have insomnia.       No Known Allergies   Past Medical History:  Diagnosis Date  . Breast cancer ([place])   . Cancer ([place])    left breast  . DVT (deep venous thrombosis) ([place])    2019  . Hypertension   . Port catheter in place 09/14/2016   Placed 09/10/2016 RT chest.     Past Surgical History:  Procedure Laterality Date  . ABDOMINAL HYSTERECTOMY    . APPENDECTOMY    . BREAST BIOPSY    . CYST EXCISION Right 09/2016   Buttocks  . IVC FILTER REMOVAL N/A 01/17/2018   Procedure: IVC FILTER REMOVAL;  Surgeon: Algernon Huxley, MD;  Location: [place] [facility];  Service: Cardiovascular;  Laterality: N/A;  . LOWER EXTREMITY ANGIOGRAPHY Right 11/11/2017   Procedure: LOWER EXTREMITY ANGIOGRAPHY;  Surgeon: Katha Cabal, MD;  Location: [place] [facility];  Service:  Cardiovascular;  Laterality: Right;  . PERIPHERAL VASCULAR THROMBECTOMY Right 08/30/2017   Procedure: PERIPHERAL VASCULAR THROMBECTOMY;  Surgeon: Algernon Huxley, MD;  Location: [place] [facility];  Service: Cardiovascular;  Laterality: Right;  . PORTACATH PLACEMENT Right 09/10/2016   Procedure: INSERTION PORT-A-CATH;  Surgeon: Jules Husbands, MD;  Location: [facility];  Service: General;  Laterality: Right;    Social History   Socioeconomic History  . Marital status: Married    Spouse name: Jeneen Rinks  . Number of children: 2  . Years of education: 6  . Highest education level: Bachelor's degree (e.g., BA, AB, BS)  Occupational History  . 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 Smoking status: Never Smoker  . Smokeless tobacco: Never Used  Substance and Sexual Activity  . Alcohol use: No  . Drug use: No  . Sexual activity: Never  Other Topics Concern  . Not on file  Social History Narrative  . Not on file   Social Determinants of Health   Financial Resource Strain:   . Difficulty of Paying Living Expenses:   Food Insecurity:   . Worried About Charity fundraiser in the Last Year:   . Arboriculturist in the Last Year:   Transportation Needs:   . Film/video editor (Medical):   Marland Kitchen Lack of Transportation (Non-Medical):   Physical Activity:   . Days of Exercise per Week:   . Minutes of Exercise per Session:   Stress:   . Feeling of Stress :   Social Connections:   . Frequency of Communication with Friends  and Family:   . Frequency of Social Gatherings with Friends and Family:   . Attends Religious Services:   . Active Member of Clubs or Organizations:   . Attends Archivist Meetings:   Marland Kitchen Marital Status:   Intimate Partner Violence:   . Fear of Current or Ex-Partner:   . Emotionally Abused:   Marland Kitchen Physically Abused:   . Sexually Abused:     Family History  Problem Relation Age of Onset  . Parkinson's disease Mother     . Bladder Cancer Father   . Lung cancer Father      Current Outpatient Medications:  .  acetaminophen (TYLENOL) 500 MG tablet, Take 500 mg by mouth daily as needed for moderate pain or headache. , Disp: , Rfl:  .  amLODipine (NORVASC) 10 MG tablet, Take 10 mg by mouth daily., Disp: , Rfl:  .  aspirin EC 81 MG tablet, Take 1 tablet (81 mg total) by mouth daily., Disp: 150 tablet, Rfl: 2 .  Calcium Carb-Cholecalciferol (CALCIUM 600/VITAMIN D3 PO), Take 1 tablet by mouth daily., Disp: , Rfl:  .  Cholecalciferol (VITAMIN D3) 10 MCG (400 UNIT) CAPS, Take 1 capsule by mouth daily., Disp: , Rfl:  .  IBRANCE 75 MG tablet, TAKE 1 TABLET (75 MG TOTAL) BY MOUTH DAILY. TAKE FOR 21 DAYS ON, 7 DAYS OFF, REPEAT EVERY 28 DAYS., Disp: 21 tablet, Rfl: 3 .  letrozole (FEMARA) 2.5 MG tablet, Take 1 tablet (2.5 mg total) by mouth daily., Disp: 90 tablet, Rfl: 3 .  lidocaine-prilocaine (EMLA) cream, Apply 1 application topically as needed (port access)., Disp: 30 g, Rfl: 2 .  losartan (COZAAR) 25 MG tablet, Take 25 mg by mouth daily., Disp: , Rfl:  .  PREVIDENT 5000 BOOSTER PLUS 1.1 % PSTE, Place 1 application onto teeth 2 (two) times daily. , Disp: , Rfl:  .  apixaban (ELIQUIS) 5 MG TABS tablet, Take 1 tablet (5 mg total) by mouth 2 (two) times daily., Disp: 60 tablet, Rfl: 6 .  ibuprofen (ADVIL,MOTRIN) 400 MG tablet, Take 400 mg by mouth 3 (three) times daily as needed., Disp: , Rfl:  .  prochlorperazine (COMPAZINE) 10 MG tablet, Take 1 tablet (10 mg total) by mouth every 6 (six) hours as needed (Nausea or vomiting). (Patient not taking: Reported on 07/17/2019), Disp: 30 tablet, Rfl: 2  Physical exam:  Vitals:   09/18/19 1317  BP: (!) 155/65  Pulse: 79  Resp: 18  Temp: (!) 97.2 F (36.2 C)  TempSrc: Tympanic  SpO2: 98%  Weight: 203 lb 9.6 oz (92.4 kg)   Physical Exam Constitutional:      General: She is not in acute distress. Cardiovascular:     Rate and Rhythm: Normal rate and regular rhythm.      Heart sounds: Normal heart sounds.  Pulmonary:     Effort: Pulmonary effort is normal.     Breath sounds: Normal breath sounds.  Abdominal:     General: Bowel sounds are normal.     Palpations: Abdomen is soft.  Skin:    General: Skin is warm and dry.  Neurological:     Mental Status: She is alert and oriented to person, place, and time.      CMP Latest Ref Rng & Units 09/18/2019  Glucose 70 - 99 mg/dL 196(H)  BUN 8 - 23 mg/dL 14  Creatinine 0.44 - 1.00 mg/dL 0.90  Sodium 135 - 145 mmol/L 138  Potassium 3.5 - 5.1 mmol/L 3.8  Chloride  98 - 111 mmol/L 103  CO2 22 - 32 mmol/L 25  Calcium 8.9 - 10.3 mg/dL 9.9  Total Protein 6.5 - 8.1 g/dL 7.5  Total Bilirubin 0.3 - 1.2 mg/dL 0.9  Alkaline Phos 38 - 126 U/L 61  AST 15 - 41 U/L 32  ALT 0 - 44 U/L 32   CBC Latest Ref Rng & Units 09/18/2019  WBC 4.0 - 10.5 K/uL 2.8(L)  Hemoglobin 12.0 - 15.0 g/dL 13.4  Hematocrit 36.0 - 46.0 % 37.5  Platelets 150 - 400 K/uL 305     Assessment and plan- Patient is a 79 y.o. female  with metastatic breast cancer and bone only metastases currently on letrozole and Ibrance.  She is here for routine follow-up  Clinically patient is doing well.  Suspect ongoing fatigue with Ibrance.  We discussed continuing Ibrance along with letrozole but she has been on for over 3 years now with stable disease versus giving her a break from [place] and continuing letrozole alone and initiating Ibrance back at progression.  Patient wishes to continue Ibrance at this time.  Ibrance-induced neutropenia: White count low at 2.8 but ANC remains more than 1.  She will continue Ibrance at 75 mg 3 weeks on and 1 week off  Patient will receive Zometa today for her bone metastases.  Labs in 6 weeks and 12 weeks and I will see her back in 12 weeks  Patient has a history of right lower extremity DVT and will continue to remain on Eliquis   Visit Diagnosis 1. High risk medication use   2. Drug-induced neutropenia (HCC)   3.  Bone metastases ([place])   4. Malignant neoplasm of right breast in female, estrogen receptor positive, unspecified site of breast ([place])   5. Long term (current) use of bisphosphonates      Dr. Randa Evens, MD, MPH [facility] [phone] 09/20/2019 10:22 AM

## 2019-09-28 ENCOUNTER — Other Ambulatory Visit: Payer: Self-pay | Admitting: Oncology

## 2019-09-28 DIAGNOSIS — C50912 Malignant neoplasm of unspecified site of left female breast: Secondary | ICD-10-CM

## 2019-10-02 MED FILL — IBRANCE 75 MG TABS: 75 | 28 days supply | Qty: 21 | Fill #0

## 2019-10-30 ENCOUNTER — Other Ambulatory Visit: Payer: Self-pay

## 2019-10-30 ENCOUNTER — Inpatient Hospital Stay: Payer: Medicare Other | Attending: Oncology

## 2019-10-30 DIAGNOSIS — Z170 Estrogen receptor positive status [ER+]: Secondary | ICD-10-CM | POA: Insufficient documentation

## 2019-10-30 DIAGNOSIS — C50912 Malignant neoplasm of unspecified site of left female breast: Secondary | ICD-10-CM | POA: Diagnosis not present

## 2019-10-30 DIAGNOSIS — C50911 Malignant neoplasm of unspecified site of right female breast: Secondary | ICD-10-CM

## 2019-10-30 LAB — COMPREHENSIVE METABOLIC PANEL
ALT: 35 U/L (ref 0–44)
AST: 31 U/L (ref 15–41)
Albumin: 4.1 g/dL (ref 3.5–5.0)
Alkaline Phosphatase: 60 U/L (ref 38–126)
Anion gap: 8 (ref 5–15)
BUN: 13 mg/dL (ref 8–23)
CO2: 26 mmol/L (ref 22–32)
Calcium: 9.3 mg/dL (ref 8.9–10.3)
Chloride: 103 mmol/L (ref 98–111)
Creatinine, Ser: 0.95 mg/dL (ref 0.44–1.00)
GFR calc Af Amer: >60 mL/min (ref >60)
GFR calc non Af Amer: 58 mL/min — ABNORMAL LOW (ref >60)
Glucose, Bld: 124 mg/dL — ABNORMAL HIGH (ref 70–99)
Potassium: 4.2 mmol/L (ref 3.5–5.1)
Sodium: 137 mmol/L (ref 135–145)
Total Bilirubin: 1.1 mg/dL (ref 0.3–1.2)
Total Protein: 7.3 g/dL (ref 6.5–8.1)

## 2019-10-30 LAB — CBC WITH DIFFERENTIAL/PLATELET
Abs Immature Granulocytes: 0 10*3/uL (ref 0.00–0.07)
Basophils Absolute: 0 10*3/uL (ref 0.0–0.1)
Basophils Relative: 1 %
Eosinophils Absolute: 0 10*3/uL (ref 0.0–0.5)
Eosinophils Relative: 2 %
HCT: 36.9 % (ref 36.0–46.0)
Hemoglobin: 13.5 g/dL (ref 12.0–15.0)
Immature Granulocytes: 0 %
Lymphocytes Relative: 44 %
Lymphs Abs: 1 10*3/uL (ref 0.7–4.0)
MCH: 37.4 pg — ABNORMAL HIGH (ref 26.0–34.0)
MCHC: 36.6 g/dL — ABNORMAL HIGH (ref 30.0–36.0)
MCV: 102.2 fL — ABNORMAL HIGH (ref 80.0–100.0)
Monocytes Absolute: 0.2 10*3/uL (ref 0.1–1.0)
Monocytes Relative: 7 %
Neutro Abs: 1.1 10*3/uL — ABNORMAL LOW (ref 1.7–7.7)
Neutrophils Relative %: 46 %
Platelets: 198 10*3/uL (ref 150–400)
RBC: 3.61 MIL/uL — ABNORMAL LOW (ref 3.87–5.11)
RDW: 14.6 % (ref 11.5–15.5)
WBC: 2.3 10*3/uL — ABNORMAL LOW (ref 4.0–10.5)
nRBC: 0 % (ref 0.0–0.2)

## 2019-10-30 MED FILL — IBRANCE 75 MG TABS: 75 | 28 days supply | Qty: 21 | Fill #1

## 2019-11-27 MED FILL — IBRANCE 75 MG TABS: 75 | 28 days supply | Qty: 21 | Fill #2

## 2019-12-11 ENCOUNTER — Inpatient Hospital Stay: Payer: Medicare Other

## 2019-12-11 ENCOUNTER — Inpatient Hospital Stay: Payer: Medicare Other | Attending: Oncology

## 2019-12-11 ENCOUNTER — Inpatient Hospital Stay (HOSPITAL_BASED_OUTPATIENT_CLINIC_OR_DEPARTMENT_OTHER): Payer: Medicare Other | Admitting: Oncology

## 2019-12-11 ENCOUNTER — Other Ambulatory Visit: Payer: Self-pay

## 2019-12-11 VITALS — BP 146/58 | HR 78 | Temp 98.5°F | Wt 206.5 lb

## 2019-12-11 DIAGNOSIS — C50912 Malignant neoplasm of unspecified site of left female breast: Secondary | ICD-10-CM | POA: Diagnosis not present

## 2019-12-11 DIAGNOSIS — Z79899 Other long term (current) drug therapy: Secondary | ICD-10-CM

## 2019-12-11 DIAGNOSIS — Z79811 Long term (current) use of aromatase inhibitors: Secondary | ICD-10-CM | POA: Insufficient documentation

## 2019-12-11 DIAGNOSIS — C7951 Secondary malignant neoplasm of bone: Secondary | ICD-10-CM

## 2019-12-11 DIAGNOSIS — Z170 Estrogen receptor positive status [ER+]: Secondary | ICD-10-CM

## 2019-12-11 DIAGNOSIS — Z7983 Long term (current) use of bisphosphonates: Secondary | ICD-10-CM

## 2019-12-11 DIAGNOSIS — C50012 Malignant neoplasm of nipple and areola, left female breast: Secondary | ICD-10-CM

## 2019-12-11 DIAGNOSIS — Z7901 Long term (current) use of anticoagulants: Secondary | ICD-10-CM

## 2019-12-11 DIAGNOSIS — C50911 Malignant neoplasm of unspecified site of right female breast: Secondary | ICD-10-CM

## 2019-12-11 LAB — COMPREHENSIVE METABOLIC PANEL
ALT: 32 U/L (ref 0–44)
AST: 33 U/L (ref 15–41)
Albumin: 4 g/dL (ref 3.5–5.0)
Alkaline Phosphatase: 56 U/L (ref 38–126)
Anion gap: 11 (ref 5–15)
BUN: 13 mg/dL (ref 8–23)
CO2: 23 mmol/L (ref 22–32)
Calcium: 9.3 mg/dL (ref 8.9–10.3)
Chloride: 105 mmol/L (ref 98–111)
Creatinine, Ser: 0.87 mg/dL (ref 0.44–1.00)
GFR calc Af Amer: >60 mL/min (ref >60)
GFR calc non Af Amer: >60 mL/min (ref >60)
Glucose, Bld: 191 mg/dL — ABNORMAL HIGH (ref 70–99)
Potassium: 4 mmol/L (ref 3.5–5.1)
Sodium: 139 mmol/L (ref 135–145)
Total Bilirubin: 0.9 mg/dL (ref 0.3–1.2)
Total Protein: 7.1 g/dL (ref 6.5–8.1)

## 2019-12-11 LAB — CBC WITH DIFFERENTIAL/PLATELET
Abs Immature Granulocytes: 0.02 10*3/uL (ref 0.00–0.07)
Basophils Absolute: 0.1 10*3/uL (ref 0.0–0.1)
Basophils Relative: 3 %
Eosinophils Absolute: 0.1 10*3/uL (ref 0.0–0.5)
Eosinophils Relative: 3 %
HCT: 37.5 % (ref 36.0–46.0)
Hemoglobin: 13.6 g/dL (ref 12.0–15.0)
Immature Granulocytes: 1 %
Lymphocytes Relative: 38 %
Lymphs Abs: 0.9 10*3/uL (ref 0.7–4.0)
MCH: 37.1 pg — ABNORMAL HIGH (ref 26.0–34.0)
MCHC: 36.3 g/dL — ABNORMAL HIGH (ref 30.0–36.0)
MCV: 102.2 fL — ABNORMAL HIGH (ref 80.0–100.0)
Monocytes Absolute: 0.1 10*3/uL (ref 0.1–1.0)
Monocytes Relative: 5 %
Neutro Abs: 1.2 10*3/uL — ABNORMAL LOW (ref 1.7–7.7)
Neutrophils Relative %: 50 %
Platelets: 326 10*3/uL (ref 150–400)
RBC: 3.67 MIL/uL — ABNORMAL LOW (ref 3.87–5.11)
RDW: 13.9 % (ref 11.5–15.5)
Smear Review: NORMAL
WBC: 2.4 10*3/uL — ABNORMAL LOW (ref 4.0–10.5)
nRBC: 0 % (ref 0.0–0.2)

## 2019-12-11 MED ORDER — HEPARIN SOD (PORK) LOCK FLUSH 100 UNIT/ML IV SOLN
INTRAVENOUS | Status: AC
  Filled 2019-12-11: qty 5

## 2019-12-11 MED ORDER — ZOLEDRONIC ACID 4 MG/100ML IV SOLN
4.0000 mg | Freq: Once | INTRAVENOUS | Status: AC
  Administered 2019-12-11: 4 mg via INTRAVENOUS
  Filled 2019-12-11: qty 100

## 2019-12-11 MED ORDER — HEPARIN SOD (PORK) LOCK FLUSH 100 UNIT/ML IV SOLN
500.0000 [IU] | Freq: Once | INTRAVENOUS | Status: AC | PRN
  Administered 2019-12-11: 500 [IU]
  Filled 2019-12-11: qty 5

## 2019-12-11 MED ORDER — SODIUM CHLORIDE 0.9 % IV SOLN
Freq: Once | INTRAVENOUS | Status: AC
  Filled 2019-12-11: qty 250

## 2019-12-11 NOTE — Progress Notes (Signed)
Hematology/Oncology Consult note [facility]  Telephone:([phone] [phone]  Patient Care Team: Letta Median, MD as PCP - General (Family Medicine) Sindy Guadeloupe, MD as Consulting Physician (Oncology)   Name of the patient: Victoria Steele  [id number]  [birth year]   Date of visit: 12/11/19  Diagnosis- Stage IV invasive mammary carcinoma [id number] with bone only metastases  Chief complaint/ Reason for visit-routine follow-up of breast cancer on letrozole and Ibrance  Heme/Onc history: Patient is a 79 year old female who was diagnosed with left breast cancer ER/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  Patient has been on Ibrance and letrozole since then.  She had cytopenias with [facility] and is taking 75 mg 3 weeks on 1 week off which was started in September 2018.  Patient is on Zometa for bone metastases which she is getting every 3 months. Patient had extensive RLE DVT in April 2019 and is currently on eliquis. She also underwent venous thrombectomy and thrombolytic therapy and IVC placement by Dr. Lucky Cowboy.She also has peripheral vascular disease secondary to atherosclerosis and underwent stenting with vascular surgery as well   Interval history-patient reports tolerating Ibrance and letrozole well without any significant side effects.  She is also taking her Eliquis regularly.  Overall she is doing well her appetite and weight have remained stable.  Denies any new aches and pains anywhere.  Reports occasional stinging sensation in her left breast which can last for a few minutes and goes away on its own  ECOG PS- 1 Pain scale- 0   Review of systems- Review of Systems  Constitutional: Negative for chills, fever, malaise/fatigue and weight loss.  HENT: Negative for congestion, ear discharge and nosebleeds.   Eyes: Negative for blurred vision.  Respiratory: Negative for cough, hemoptysis, sputum production, shortness of  breath and wheezing.   Cardiovascular: Negative for chest pain, palpitations, orthopnea and claudication.  Gastrointestinal: Negative for abdominal pain, blood in stool, constipation, diarrhea, heartburn, melena, nausea and vomiting.  Genitourinary: Negative for dysuria, flank pain, frequency, hematuria and urgency.  Musculoskeletal: Negative for back pain, joint pain and myalgias.  Skin: Negative for rash.  Neurological: Negative for dizziness, tingling, focal weakness, seizures, weakness and headaches.  Endo/Heme/Allergies: Does not bruise/bleed easily.  Psychiatric/Behavioral: Negative for depression and suicidal ideas. The patient does not have insomnia.        No Known Allergies   Past Medical History:  Diagnosis Date  . Breast cancer ([place])   . Cancer ([place])    left breast  . DVT (deep venous thrombosis) ([place])    2019  . Hypertension   . Port catheter in place 09/14/2016   Placed 09/10/2016 RT chest.     Past Surgical History:  Procedure Laterality Date  . ABDOMINAL HYSTERECTOMY    . APPENDECTOMY    . BREAST BIOPSY    . CYST EXCISION Right 09/2016   Buttocks  . IVC FILTER REMOVAL N/A 01/17/2018   Procedure: IVC FILTER REMOVAL;  Surgeon: Algernon Huxley, MD;  Location: [facility];  Service: Cardiovascular;  Laterality: N/A;  . LOWER EXTREMITY ANGIOGRAPHY Right 11/11/2017   Procedure: LOWER EXTREMITY ANGIOGRAPHY;  Surgeon: Katha Cabal, MD;  Location: [facility];  Service: Cardiovascular;  Laterality: Right;  . PERIPHERAL VASCULAR THROMBECTOMY Right 08/30/2017   Procedure: PERIPHERAL VASCULAR THROMBECTOMY;  Surgeon: Algernon Huxley, MD;  Location: [place] [facility];  Service: Cardiovascular;  Laterality: Right;  . PORTACATH PLACEMENT Right 09/10/2016  Procedure: INSERTION PORT-A-CATH;  Surgeon: Jules Husbands, MD;  Location: [facility];  Service: General;  Laterality: Right;    Social History   Socioeconomic History  . Marital status: Married     Spouse name: Jeneen Rinks  . Number of children: 2  . Years of education: 6  . Highest education level: Bachelor's degree (e.g., BA, AB, BS)  Occupational History  . 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 Smoking status: Never Smoker  . Smokeless tobacco: Never Used  Vaping Use  . Vaping Use: Never used  Substance and Sexual Activity  . Alcohol use: No  . Drug use: No  . Sexual activity: Never  Other Topics Concern  . Not on file  Social History Narrative  . Not on file   Social Determinants of Health   Financial Resource Strain:   . Difficulty of Paying Living Expenses: Not on file  Food Insecurity:   . Worried About Charity fundraiser in the Last Year: Not on file  . Ran Out of Food in the Last Year: Not on file  Transportation Needs:   . Lack of Transportation (Medical): Not on file  . Lack of Transportation (Non-Medical): Not on file  Physical Activity:   . Days of Exercise per Week: Not on file  . Minutes of Exercise per Session: Not on file  Stress:   . Feeling of Stress : Not on file  Social Connections:   . Frequency of Communication with Friends and Family: Not on file  . Frequency of Social Gatherings with Friends and Family: Not on file  . Attends Religious Services: Not on file  . Active Member of Clubs or Organizations: Not on file  . Attends Archivist Meetings: Not on file  . Marital Status: Not on file  Intimate Partner Violence:   . Fear of Current or Ex-Partner: Not on file  . Emotionally Abused: Not on file  . Physically Abused: Not on file  . Sexually Abused: Not on file    Family History  Problem Relation Age of Onset  . Parkinson's disease Mother   . Bladder Cancer Father   . Lung cancer Father      Current Outpatient Medications:  .  acetaminophen (TYLENOL) 500 MG tablet, Take 500 mg by mouth daily as needed for moderate pain or headache. , Disp: , Rfl:  .  amLODipine (NORVASC) 10 MG  tablet, Take 10 mg by mouth daily., Disp: , Rfl:  .  aspirin EC 81 MG tablet, Take 1 tablet (81 mg total) by mouth daily., Disp: 150 tablet, Rfl: 2 .  Calcium Carb-Cholecalciferol (CALCIUM 600/VITAMIN D3 PO), Take 1 tablet by mouth daily., Disp: , Rfl:  .  IBRANCE 75 MG tablet, TAKE 1 TABLET (75 MG TOTAL) BY MOUTH DAILY. TAKE FOR 21 DAYS ON, 7 DAYS OFF, REPEAT EVERY 28 DAYS., Disp: 21 tablet, Rfl: 3 .  letrozole (FEMARA) 2.5 MG tablet, Take 1 tablet (2.5 mg total) by mouth daily., Disp: 90 tablet, Rfl: 3 .  lidocaine-prilocaine (EMLA) cream, Apply 1 application topically as needed (port access)., Disp: 30 g, Rfl: 2 .  losartan (COZAAR) 25 MG tablet, Take 25 mg by mouth daily., Disp: , Rfl:  .  PREVIDENT 5000 BOOSTER PLUS 1.1 % PSTE, Place 1 application onto teeth 2 (two) times daily. , Disp: , Rfl:  .  apixaban (ELIQUIS) 5 MG TABS tablet, Take 1 tablet (5 mg total) by mouth  2 (two) times daily., Disp: 60 tablet, Rfl: 6 .  Cholecalciferol (VITAMIN D3) 10 MCG (400 UNIT) CAPS, Take 1 capsule by mouth daily. (Patient not taking: Reported on 12/11/2019), Disp: , Rfl:  .  ibuprofen (ADVIL,MOTRIN) 400 MG tablet, Take 400 mg by mouth 3 (three) times daily as needed. (Patient not taking: Reported on 12/11/2019), Disp: , Rfl:  .  prochlorperazine (COMPAZINE) 10 MG tablet, Take 1 tablet (10 mg total) by mouth every 6 (six) hours as needed (Nausea or vomiting). (Patient not taking: Reported on 07/17/2019), Disp: 30 tablet, Rfl: 2  Physical exam:  Vitals:   12/11/19 1319  BP: (!) 146/58  Pulse: 78  Temp: 98.5 F (36.9 C)  TempSrc: Tympanic  SpO2: 100%  Weight: 206 lb 8 oz (93.7 kg)   Physical Exam Constitutional:      General: She is not in acute distress. Pulmonary:     Effort: Pulmonary effort is normal.  Abdominal:     General: Bowel sounds are normal.     Palpations: Abdomen is soft.  Skin:    General: Skin is warm and dry.  Neurological:     Mental Status: She is alert and oriented to person,  place, and time.   Left breast exam: There is no distinct palpable mass in the left breast but there is some chronic skin thickening noted around the left areola.  No palpable left axillary adenopathy  CMP Latest Ref Rng & Units 12/11/2019  Glucose 70 - 99 mg/dL 191(H)  BUN 8 - 23 mg/dL 13  Creatinine 0.44 - 1.00 mg/dL 0.87  Sodium 135 - 145 mmol/L 139  Potassium 3.5 - 5.1 mmol/L 4.0  Chloride 98 - 111 mmol/L 105  CO2 22 - 32 mmol/L 23  Calcium 8.9 - 10.3 mg/dL 9.3  Total Protein 6.5 - 8.1 g/dL 7.1  Total Bilirubin 0.3 - 1.2 mg/dL 0.9  Alkaline Phos 38 - 126 U/L 56  AST 15 - 41 U/L 33  ALT 0 - 44 U/L 32   CBC Latest Ref Rng & Units 12/11/2019  WBC 4.0 - 10.5 K/uL 2.4(L)  Hemoglobin 12.0 - 15.0 g/dL 13.6  Hematocrit 36 - 46 % 37.5  Platelets 150 - 400 K/uL 326      Assessment and plan- Patient is a 79 y.o. female with metastatic breast cancer and bone only metastases currently on letrozole and Ibrance.   She is here for routine follow-up of breast cancer  Patient has stable leukopenia but ANC remains more than 1.  Hemoglobin and platelet counts are stable.  She will continue to take Ibrance 75 mg 3 weeks on and 1 week off.  She will continue to take letrozole daily.  Tumor markers remain stable I will plan to repeat CT chest abdomen pelvis with contrast in 3 months.  Zometa now and Zometa again in 3 months and I will see her thereafter to discuss CT scan results   Visit Diagnosis 1. Bone metastases ([place])   2. High risk medication use   3. 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 Long term (current) use of bisphosphonates   5. Current use of long term anticoagulation      Dr. Randa Evens, MD, MPH [facility] [phone] 12/11/2019 2:29 PM

## 2019-12-12 LAB — CANCER ANTIGEN 27.29: CA 27.29: 74.7 U/mL — ABNORMAL HIGH (ref 0.0–38.6)

## 2019-12-25 MED FILL — IBRANCE 75 MG TABS: 75 | 28 days supply | Qty: 21 | Fill #3

## 2020-01-19 ENCOUNTER — Other Ambulatory Visit: Payer: Self-pay | Admitting: Oncology

## 2020-01-19 DIAGNOSIS — C50912 Malignant neoplasm of unspecified site of left female breast: Secondary | ICD-10-CM

## 2020-01-25 MED FILL — IBRANCE 75 MG TABS: 75 | 28 days supply | Qty: 21 | Fill #0

## 2020-02-21 MED FILL — IBRANCE 75 MG TABS: 75 | 28 days supply | Qty: 21 | Fill #1

## 2020-03-05 ENCOUNTER — Ambulatory Visit
Admission: RE | Admit: 2020-03-05 | Discharge: 2020-03-05 | Disposition: A | Discharge status: Home / Self Care | Payer: Medicare Other | Source: Ambulatory Visit | Attending: Oncology | Admitting: Oncology

## 2020-03-05 ENCOUNTER — Other Ambulatory Visit: Payer: Self-pay

## 2020-03-05 ENCOUNTER — Encounter
Admission: RE | Admit: 2020-03-05 | Discharge: 2020-03-05 | Disposition: A | Discharge status: Home / Self Care | Payer: Medicare Other | Source: Ambulatory Visit | Attending: Oncology | Admitting: Oncology

## 2020-03-05 DIAGNOSIS — C7951 Secondary malignant neoplasm of bone: Secondary | ICD-10-CM | POA: Insufficient documentation

## 2020-03-05 LAB — POCT I-STAT CREATININE: Creatinine, Ser: 1.1 mg/dL — ABNORMAL HIGH (ref 0.44–1.00)

## 2020-03-05 MED ORDER — IOHEXOL 300 MG/ML  SOLN
100.0000 mL | Freq: Once | INTRAMUSCULAR | Status: AC | PRN
  Administered 2020-03-05: 100 mL via INTRAVENOUS

## 2020-03-05 MED ORDER — TECHNETIUM TC 99M MEDRONATE IV KIT
20.0000 | PACK | Freq: Once | INTRAVENOUS | Status: AC | PRN
  Administered 2020-03-05: 22.97 via INTRAVENOUS

## 2020-03-05 MED ORDER — APIXABAN 5 MG PO TABS: 5.0000 mg | ORAL_TABLET | Freq: Two times a day (BID) | ORAL | 6 refills | Status: DC

## 2020-03-12 ENCOUNTER — Inpatient Hospital Stay: Payer: Medicare Other | Attending: Oncology | Admitting: Oncology

## 2020-03-12 ENCOUNTER — Inpatient Hospital Stay: Payer: Medicare Other

## 2020-03-12 ENCOUNTER — Encounter: Payer: Self-pay | Admitting: Oncology

## 2020-03-12 VITALS — BP 144/70 | HR 70 | Temp 98.2°F | Resp 16 | Wt 207.0 lb

## 2020-03-12 DIAGNOSIS — Z7983 Long term (current) use of bisphosphonates: Secondary | ICD-10-CM | POA: Diagnosis not present

## 2020-03-12 DIAGNOSIS — Z79811 Long term (current) use of aromatase inhibitors: Secondary | ICD-10-CM | POA: Insufficient documentation

## 2020-03-12 DIAGNOSIS — Z170 Estrogen receptor positive status [ER+]: Secondary | ICD-10-CM | POA: Diagnosis not present

## 2020-03-12 DIAGNOSIS — I10 Essential (primary) hypertension: Secondary | ICD-10-CM | POA: Insufficient documentation

## 2020-03-12 DIAGNOSIS — Z79899 Other long term (current) drug therapy: Secondary | ICD-10-CM | POA: Insufficient documentation

## 2020-03-12 DIAGNOSIS — D702 Other drug-induced agranulocytosis: Secondary | ICD-10-CM

## 2020-03-12 DIAGNOSIS — Z7901 Long term (current) use of anticoagulants: Secondary | ICD-10-CM | POA: Insufficient documentation

## 2020-03-12 DIAGNOSIS — C7951 Secondary malignant neoplasm of bone: Secondary | ICD-10-CM | POA: Diagnosis not present

## 2020-03-12 DIAGNOSIS — C50112 Malignant neoplasm of central portion of left female breast: Secondary | ICD-10-CM | POA: Insufficient documentation

## 2020-03-12 LAB — CBC WITH DIFFERENTIAL/PLATELET
Abs Immature Granulocytes: 0.01 10*3/uL (ref 0.00–0.07)
Basophils Absolute: 0 10*3/uL (ref 0.0–0.1)
Basophils Relative: 2 %
Eosinophils Absolute: 0.1 10*3/uL (ref 0.0–0.5)
Eosinophils Relative: 3 %
HCT: 36.7 % (ref 36.0–46.0)
Hemoglobin: 13.1 g/dL (ref 12.0–15.0)
Immature Granulocytes: 0 %
Lymphocytes Relative: 40 %
Lymphs Abs: 0.9 10*3/uL (ref 0.7–4.0)
MCH: 38 pg — ABNORMAL HIGH (ref 26.0–34.0)
MCHC: 35.7 g/dL (ref 30.0–36.0)
MCV: 106.4 fL — ABNORMAL HIGH (ref 80.0–100.0)
Monocytes Absolute: 0.2 10*3/uL (ref 0.1–1.0)
Monocytes Relative: 7 %
Neutro Abs: 1.1 10*3/uL — ABNORMAL LOW (ref 1.7–7.7)
Neutrophils Relative %: 48 %
Platelets: 196 10*3/uL (ref 150–400)
RBC: 3.45 MIL/uL — ABNORMAL LOW (ref 3.87–5.11)
RDW: 14.4 % (ref 11.5–15.5)
Smear Review: NORMAL
WBC: 2.3 10*3/uL — ABNORMAL LOW (ref 4.0–10.5)
nRBC: 0 % (ref 0.0–0.2)

## 2020-03-12 LAB — COMPREHENSIVE METABOLIC PANEL
ALT: 29 U/L (ref 0–44)
AST: 30 U/L (ref 15–41)
Albumin: 3.9 g/dL (ref 3.5–5.0)
Alkaline Phosphatase: 54 U/L (ref 38–126)
Anion gap: 11 (ref 5–15)
BUN: 14 mg/dL (ref 8–23)
CO2: 25 mmol/L (ref 22–32)
Calcium: 9.7 mg/dL (ref 8.9–10.3)
Chloride: 102 mmol/L (ref 98–111)
Creatinine, Ser: 0.88 mg/dL (ref 0.44–1.00)
GFR, Estimated: >60 mL/min (ref >60)
Glucose, Bld: 157 mg/dL — ABNORMAL HIGH (ref 70–99)
Potassium: 3.7 mmol/L (ref 3.5–5.1)
Sodium: 138 mmol/L (ref 135–145)
Total Bilirubin: 1 mg/dL (ref 0.3–1.2)
Total Protein: 7.3 g/dL (ref 6.5–8.1)

## 2020-03-12 MED ORDER — SODIUM CHLORIDE 0.9 % IV SOLN
Freq: Once | INTRAVENOUS | Status: AC
  Filled 2020-03-12: qty 250

## 2020-03-12 MED ORDER — ZOLEDRONIC ACID 4 MG/100ML IV SOLN
4.0000 mg | Freq: Once | INTRAVENOUS | Status: AC
  Administered 2020-03-12: 4 mg via INTRAVENOUS
  Filled 2020-03-12: qty 100

## 2020-03-12 MED ORDER — HEPARIN SOD (PORK) LOCK FLUSH 100 UNIT/ML IV SOLN
500.0000 [IU] | Freq: Once | INTRAVENOUS | Status: AC | PRN
  Administered 2020-03-12: 500 [IU]
  Filled 2020-03-12: qty 5

## 2020-03-12 MED ORDER — SODIUM CHLORIDE 0.9% FLUSH
10.0000 mL | Freq: Once | INTRAVENOUS | Status: AC | PRN
  Administered 2020-03-12: 10 mL
  Filled 2020-03-12: qty 10

## 2020-03-12 MED ORDER — HEPARIN SOD (PORK) LOCK FLUSH 100 UNIT/ML IV SOLN
INTRAVENOUS | Status: AC
  Filled 2020-03-12: qty 5

## 2020-03-12 NOTE — Progress Notes (Signed)
1450- Patient tolerated Zometa infusion well. Patient stable and discharged to home at this time.

## 2020-03-13 LAB — CANCER ANTIGEN 27.29: CA 27.29: 61.2 U/mL — ABNORMAL HIGH (ref 0.0–38.6)

## 2020-03-14 NOTE — Progress Notes (Signed)
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  Patient has been on Ibrance and letrozole since then.  She had cytopenias with Leslee Home and is taking 75 mg 3 weeks on 1 week off which was started in September 2018.  Patient is on Zometa for bone metastases which she is getting every 3 months. Patient had extensive RLE DVT in April 2019 and is currently on eliquis. She also underwent venous thrombectomy and thrombolytic therapy and IVC placement by Dr. Lucky Cowboy.She also has peripheral vascular disease secondary to atherosclerosis and underwent stenting with vascular surgery as well    Interval history-patient reports doing well and denies any complaints at this time.  Appetite and weight has remained stable.  Denies any new aches and pains.  Tolerating Ibrance well without any significant side effects.  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  HENT: Negative for congestion, ear discharge and nosebleeds.   Eyes: Negative for blurred vision.  Respiratory: Negative for cough, hemoptysis, sputum production, shortness of breath and wheezing.   Cardiovascular:  Negative for chest pain, palpitations, orthopnea and claudication.  Gastrointestinal: Negative for abdominal pain, blood in stool, constipation, diarrhea, heartburn, melena, nausea and vomiting.  Genitourinary: Negative for dysuria, flank pain, frequency, hematuria and urgency.  Musculoskeletal: Negative for back pain, joint pain and myalgias.  Skin: Negative for rash.  Neurological: Negative for dizziness, tingling, focal weakness, seizures, weakness and headaches.  Endo/Heme/Allergies: Does not bruise/bleed easily.  Psychiatric/Behavioral: Negative for depression and suicidal ideas. The patient does not have insomnia.      No Known Allergies   Past Medical History:  Diagnosis Date  . Breast cancer ([place])   . Cancer ([place])    left breast  . DVT (deep venous thrombosis) ([place])    2019  . Hypertension   . Port catheter in place 09/14/2016   Placed 09/10/2016 RT chest.     Past Surgical History:  Procedure Laterality Date  . ABDOMINAL HYSTERECTOMY    . APPENDECTOMY    . BREAST BIOPSY    . CYST EXCISION Right 09/2016   Buttocks  . IVC FILTER REMOVAL N/A 01/17/2018   Procedure: IVC FILTER REMOVAL;  Surgeon: Algernon Huxley, MD;  Location: [place] [facility];  Service: Cardiovascular;  Laterality: N/A;  . LOWER EXTREMITY ANGIOGRAPHY Right 11/11/2017   Procedure: LOWER EXTREMITY ANGIOGRAPHY;  Surgeon: Katha Cabal, MD;  Location: [place] [facility];  Service: Cardiovascular;  Laterality: Right;  . PERIPHERAL VASCULAR THROMBECTOMY Right 08/30/2017   Procedure: PERIPHERAL VASCULAR THROMBECTOMY;  Surgeon: Algernon Huxley, MD;  Location: [place] [facility];  Service: Cardiovascular;  Laterality: Right;  . PORTACATH PLACEMENT Right 09/10/2016   Procedure: INSERTION PORT-A-CATH;  Surgeon: Dahlia Byes,  Marjory Lies, MD;  Location: [facility];  Service: General;  Laterality: Right;    Social History   Socioeconomic History  . Marital status: Married    Spouse name: Jeneen Rinks  . Number of children: 2    . Years of education: 6  . Highest education level: Bachelor's degree (e.g., BA, AB, BS)  Occupational History  . 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 Smoking status: Never Smoker  . Smokeless tobacco: Never Used  Vaping Use  . Vaping Use: Never used  Substance and Sexual Activity  . Alcohol use: No  . Drug use: No  . Sexual activity: Never  Other Topics Concern  . Not on file  Social History Narrative  . Not on file   Social Determinants of Health   Financial Resource Strain:   . Difficulty of Paying Living Expenses: Not on file  Food Insecurity:   . Worried About Charity fundraiser in the Last Year: Not on file  . Ran Out of Food in the Last Year: Not on file  Transportation Needs:   . Lack of Transportation (Medical): Not on file  . Lack of Transportation (Non-Medical): Not on file  Physical Activity:   . Days of Exercise per Week: Not on file  . Minutes of Exercise per Session: Not on file  Stress:   . Feeling of Stress : Not on file  Social Connections:   . Frequency of Communication with Friends and Family: Not on file  . Frequency of Social Gatherings with Friends and Family: Not on file  . Attends Religious Services: Not on file  . Active Member of Clubs or Organizations: Not on file  . Attends Archivist Meetings: Not on file  . Marital Status: Not on file  Intimate Partner Violence:   . Fear of Current or Ex-Partner: Not on file  . Emotionally Abused: Not on file  . Physically Abused: Not on file  . Sexually Abused: Not on file    Family History  Problem Relation Age of Onset  . Parkinson's disease Mother   . Bladder Cancer Father   . Lung cancer Father      Current Outpatient Medications:  .  acetaminophen (TYLENOL) 500 MG tablet, Take 500 mg by mouth daily as needed for moderate pain or headache. , Disp: , Rfl:  .  amLODipine (NORVASC) 10 MG tablet, Take 10 mg by mouth daily., Disp: , Rfl:   .  apixaban (ELIQUIS) 5 MG TABS tablet, Take 1 tablet (5 mg total) by mouth 2 (two) times daily., Disp: 60 tablet, Rfl: 6 .  atorvastatin (LIPITOR) 20 MG tablet, Take 20 mg by mouth daily., Disp: , Rfl:  .  Calcium Carb-Cholecalciferol (CALCIUM 600/VITAMIN D3 PO), Take 1 tablet by mouth daily., Disp: , Rfl:  .  Cholecalciferol (VITAMIN D3) 10 MCG (400 UNIT) CAPS, Take 1 capsule by mouth daily. , Disp: , Rfl:  .  IBRANCE 75 MG tablet, TAKE 1 TABLET (75 MG TOTAL) BY MOUTH DAILY. TAKE FOR 21 DAYS ON, 7 DAYS OFF, REPEAT EVERY 28 DAYS., Disp: 21 tablet, Rfl: 3 .  ibuprofen (ADVIL,MOTRIN) 400 MG tablet, Take 400 mg by mouth 3 (three) times daily as needed. , Disp: , Rfl:  .  letrozole (FEMARA) 2.5 MG tablet, Take 1 tablet (2.5 mg total) by mouth daily., Disp: 90 tablet, Rfl: 3 .  lidocaine-prilocaine (EMLA) cream, Apply 1 application topically as needed (port access)., Disp:  30 g, Rfl: 2 .  losartan (COZAAR) 100 MG tablet, Take 100 mg by mouth daily., Disp: , Rfl:  .  PREVIDENT 5000 BOOSTER PLUS 1.1 % PSTE, Place 1 application onto teeth 2 (two) times daily. , Disp: , Rfl:  .  prochlorperazine (COMPAZINE) 10 MG tablet, Take 1 tablet (10 mg total) by mouth every 6 (six) hours as needed (Nausea or vomiting)., Disp: 30 tablet, Rfl: 2  Physical exam:  Vitals:   03/12/20 1346  BP: (!) 144/70  Pulse: 70  Resp: 16  Temp: 98.2 F (36.8 C)  TempSrc: Tympanic  Weight: 207 lb (93.9 kg)   Physical Exam HENT:     Mouth/Throat:     Mouth: Mucous membranes are moist.     Pharynx: Oropharynx is clear.  Cardiovascular:     Rate and Rhythm: Normal rate and regular rhythm.     Heart sounds: Normal heart sounds.  Pulmonary:     Effort: Pulmonary effort is normal.     Breath sounds: Normal breath sounds.  Abdominal:     General: Bowel sounds are normal.     Palpations: Abdomen is soft.  Skin:    General: Skin is warm and dry.  Neurological:     Mental Status: She is alert and oriented to person,  place, and time.      CMP Latest Ref Rng & Units 03/12/2020  Glucose 70 - 99 mg/dL 157(H)  BUN 8 - 23 mg/dL 14  Creatinine 0.44 - 1.00 mg/dL 0.88  Sodium 135 - 145 mmol/L 138  Potassium 3.5 - 5.1 mmol/L 3.7  Chloride 98 - 111 mmol/L 102  CO2 22 - 32 mmol/L 25  Calcium 8.9 - 10.3 mg/dL 9.7  Total Protein 6.5 - 8.1 g/dL 7.3  Total Bilirubin 0.3 - 1.2 mg/dL 1.0  Alkaline Phos 38 - 126 U/L 54  AST 15 - 41 U/L 30  ALT 0 - 44 U/L 29   CBC Latest Ref Rng & Units 03/12/2020  WBC 4.0 - 10.5 K/uL 2.3(L)  Hemoglobin 12.0 - 15.0 g/dL 13.1  Hematocrit 36 - 46 % 36.7  Platelets 150 - 400 K/uL 196    No images are attached to the encounter.  NM Bone Scan Whole Body  Result Date: 03/05/2020 CLINICAL DATA:  Metastatic breast cancer diagnosed in 2019. No bone pain or recent injury. EXAM: NUCLEAR MEDICINE WHOLE BODY BONE SCAN TECHNIQUE: Whole body anterior and posterior images were obtained approximately 3 hours after intravenous injection of radiopharmaceutical. RADIOPHARMACEUTICALS:  22.97 mCi Technetium-74m MDP IV COMPARISON:  Bone scan 05/15/2019 and 07/22/2018. CTs of the chest, abdomen and pelvis today and 05/15/2019. FINDINGS: Minimally heterogeneous uptake is again noted within the sternum, spine, ribs and bony pelvis, unchanged from the most recent study and consistent with treated osseous metastatic disease. This uptake has improved compared with the patient's baseline study. No new lesions are identified. There is degenerative activity in the shoulders, knees and ankles. The soft tissue activity is unremarkable. IMPRESSION: Stable minimally heterogeneous osseous uptake corresponding with treated chronic blastic osseous metastatic disease on CT. No new findings. Electronically Signed   By: Richardean Sale M.D.   On: 03/05/2020 13:44   CT CHEST ABDOMEN PELVIS W CONTRAST  Result Date: 03/05/2020 CLINICAL DATA:  Follow-up metastatic breast cancer diagnosed in 2019. Previous appendectomy and  hysterectomy. EXAM: CT CHEST, ABDOMEN, AND PELVIS WITH CONTRAST TECHNIQUE: Multidetector CT imaging of the chest, abdomen and pelvis was performed following the standard protocol during bolus administration of intravenous  contrast. CONTRAST:  OMNIPAQUE IOHEXOL 300 MG/ML  SOLN COMPARISON:  Abdominopelvic CT 05/15/2019. Bone scan today and 05/15/2019 FINDINGS: CT CHEST FINDINGS Cardiovascular: Right IJ Port-A-Cath extends to the superior cavoatrial junction. There is moderate atherosclerosis of the aorta, great vessels and coronary arteries. No acute vascular findings. The heart size is normal. There is no pericardial effusion. Mediastinum/Nodes: There are no enlarged mediastinal, hilar, axillary or internal mammary lymph nodes. There is a small hiatal hernia. The trachea and thyroid gland demonstrate no significant findings. Lungs/Pleura: There is no pleural effusion. Stable tiny subpleural nodule in the left lower lobe on image 77/4, consistent with a benign finding. No new or enlarging pulmonary nodules. Musculoskeletal/Chest wall: Widespread blastic metastatic disease is again noted throughout the sternum, ribs, thoracic spine and proximal appendicular skeleton. This appears grossly unchanged from previous study. No pathologic fracture or epidural tumor seen. CT ABDOMEN AND PELVIS FINDINGS Hepatobiliary: 5 mm subcapsular lesion in the dome of the left hepatic lobe on image 50/2 is stable, consistent with a benign finding. No new or enlarging hepatic lesions. The gallbladder is incompletely distended. No evidence of gallstones, gallbladder wall thickening or biliary dilatation. Pancreas: Unremarkable. No pancreatic ductal dilatation or surrounding inflammatory changes. Spleen: Normal in size without focal abnormality. Adrenals/Urinary Tract: Both adrenal glands appear normal. The kidneys appear normal without evidence of urinary tract calculus, suspicious lesion or hydronephrosis. No bladder abnormalities are  seen. Stomach/Bowel: No evidence of bowel wall thickening, distention or surrounding inflammatory change. Mild descending and sigmoid colon diverticulosis without acute inflammation. Vascular/Lymphatic: There are no enlarged abdominal or pelvic lymph nodes. Aortic and branch vessel atherosclerosis without acute vascular findings. The portal, superior mesenteric and splenic veins are patent. Reproductive: Hysterectomy with probable residual ovarian tissue bilaterally, unchanged. No adnexal mass. Other: No evidence of abdominal wall mass or hernia. No ascites. Musculoskeletal: Grossly stable chronic widespread blastic osseous metastatic disease. Stable chronic fracture of the superior endplate of L4 anteriorly. No acute fracture or epidural tumor. IMPRESSION: 1. Grossly stable chronic widespread blastic osseous metastatic disease. No pathologic fracture or epidural tumor seen. 2. No local recurrence or other evidence of metastatic disease within the chest, abdomen or pelvis. 3. Aortic Atherosclerosis (ICD10-I70.0). Electronically Signed   By: Carey Bullocks M.D.   On: 03/05/2020 13:40     Assessment and plan- Patient is a 79 y.o. female with metastatic breast cancer and bone only metastases currently on letrozole and Ibrance.   This is a routine follow-up visit and to receive Zometa for bone metastases  I have reviewed CT chest abdomen and pelvis as well as bone scan images independently and discussed findings with the patient.  Patient is remained on letrozole and Ibrance for close to 3 years now with stable disease.  Present scan did not show any concerning findings for progression.  She will continue to take letrozole along with Ibrance 75 mg 3 weeks on 1 week off.  She has mild drug-induced neutropenia but ANC has remained more than 1.  Patient will receive Zometa today for bone metastases and I will see her back in 3 months with CBC with differential CMP and CA 27-29 for next dose of Zometa  Continue  Eliquis for right lower extremity DVT   Visit Diagnosis 1. Bone metastases (HCC)   2. High risk medication use   3. Drug-induced neutropenia (HCC)   4. Long term (current) use of bisphosphonates   5. Malignant neoplasm of central portion of left breast in female, estrogen receptor positive (HCC)  Dr. Randa Evens, MD, MPH [facility] [phone] 03/14/2020 8:22 AM

## 2020-03-19 MED FILL — IBRANCE 75 MG TABS: 75 | 28 days supply | Qty: 21 | Fill #2

## 2020-04-11 MED FILL — IBRANCE 75 MG TABS: 75 | 28 days supply | Qty: 21 | Fill #3

## 2020-04-30 ENCOUNTER — Telehealth: Payer: Self-pay | Admitting: Pharmacy Technician

## 2020-05-01 NOTE — Telephone Encounter (Signed)
Oral Oncology Patient Advocate Encounter  Met patient in lobby to complete application for [employer] Patient Assistance in an effort to reduce patient's out of pocket expense for Ibrance to $0.    Application completed and faxed to [phone] on 04/30/20.   [employer] patient assistance phone number for follow up is [phone].   This encounter will be updated until final determination.   [place] Patient [place] Phone [phone] Fax [phone] 05/01/2020 10:53 AM

## 2020-05-08 ENCOUNTER — Other Ambulatory Visit: Payer: Self-pay | Admitting: Oncology

## 2020-05-08 DIAGNOSIS — C50912 Malignant neoplasm of unspecified site of left female breast: Secondary | ICD-10-CM

## 2020-05-08 NOTE — Telephone Encounter (Signed)
Called to check status of application.  Application is in review.  I will check back on Friday to check status again.  [place] Patient [place] Phone [phone] Fax [phone] 05/08/2020 9:19 AM

## 2020-05-13 NOTE — Telephone Encounter (Signed)
Called to check status of application. Rep stated that she would send to management for further processing.  No turn around time was known but that I could call and check the status often.  Spoke with Victoria Steele to give her an update. She is currently on her week off and is to start her next cycle on Saturday 2/5.  I told her I will check back daily to check the status.  [place] Patient [place] Phone [phone] Fax [phone] 05/13/2020 10:03 AM

## 2020-05-14 ENCOUNTER — Telehealth: Payer: Self-pay | Admitting: *Deleted

## 2020-05-14 NOTE — Telephone Encounter (Signed)
I called the pt. She had got info from [place] on her ibrance status. She had been getting asst from [place] because of the expense of the drug. I had spoke to [employer] and Bethena Roys about this. They may make the pt. Sign up for medicare extra help but alyson said that she will get samples for her and it will be probably next week until we can work on alternative options to help her afford the ibrance. I told her that I will double check about it in am since we now know that pt. Will need to sign up for Medicare extra help. Her next appt is 3/1 and was told by Bethena Roys that she will help with filling out papers on next visit.

## 2020-05-14 NOTE — Telephone Encounter (Signed)
Called [employer] to ask about fax received stating the patient needed to sign up for Medicare Extra Help.  [employer] rep stated that Victoria Steele will have to apply for Medicare Extra Help before applying for assistance next year. I spoke to Victoria Steele about applying and she states that she is not computer literate and would need some help.  I told her I would get with her at a future appointment and we could go over the application and complete it while she was in the office.  She agreed and thanked me for helping her.  Victoria Steele Patient [place] Phone [phone] Fax [phone] 05/14/2020 11:52 AM

## 2020-05-14 NOTE — Telephone Encounter (Signed)
Oral Oncology Patient Advocate Encounter  Received notification from [place] Patient Assistance program that patient has been successfully enrolled into their program to receive Ibrance from the manufacturer at $0 out of pocket until 04/12/21.    I called and spoke with patient.  She knows we will have to re-apply.   Specialty Pharmacy that will dispense medication is [facility].  Patient knows to call the office with questions or concerns.   Oral Oncology Clinic will continue to follow.  [place] Patient [place] Phone [phone] Fax [phone] 05/14/2020 9:23 AM

## 2020-05-15 ENCOUNTER — Telehealth: Payer: Self-pay | Admitting: *Deleted

## 2020-05-15 NOTE — Telephone Encounter (Signed)
Called pt and let her know that she was officially approved through [employer] to get her Leslee Home for free this year.  Sometime this year she will need to bring in information about this special Medicare assistance that would allow her additional help if she qualifies.  It takes 4 months usually to get it approved or not.  Whether she gets approved or not she would still have to register next year to get [place] assistance for the [place].  [employer] is supposed to give her a call and see when they can send out her Leslee Home for this month.  I told her that if it takes more than 7 days to give [place] a call we will see if we can get some samples of Ibrance for her to cover.  She will let me know. I did check with judy about what things we need to know from pt to start process about the Medicare Extra Help program. Bethena Roys says if you own property , if you have CD's, retirement accounts. I asked the pt. She said that she inherited a house from family and she pays taxes on it yearly but she lets a family live in it and they pay for everything and keep up the yard and the house and pays for everything but the taxes. Bethena Roys will look into it and get back with pt. Probably at her next appt. Pt ok with this

## 2020-05-17 NOTE — Telephone Encounter (Signed)
Victoria Steele called today to let me know she had not heard from [employer].  I called and added her to the call with [employer] rep, Victoria Steele.  Victoria Steele will receive her Victoria Steele on Monday.   Victoria Steele Patient Victoria Steele Phone [phone] Fax [phone] 05/17/2020 3:34 PM

## 2020-06-11 ENCOUNTER — Inpatient Hospital Stay (HOSPITAL_BASED_OUTPATIENT_CLINIC_OR_DEPARTMENT_OTHER): Payer: Medicare Other | Admitting: Oncology

## 2020-06-11 ENCOUNTER — Inpatient Hospital Stay: Payer: Medicare Other

## 2020-06-11 ENCOUNTER — Encounter: Payer: Self-pay | Admitting: Oncology

## 2020-06-11 ENCOUNTER — Other Ambulatory Visit: Payer: Self-pay

## 2020-06-11 ENCOUNTER — Inpatient Hospital Stay: Payer: Medicare Other | Attending: Oncology

## 2020-06-11 VITALS — BP 163/61 | HR 80 | Temp 98.5°F | Resp 18 | Wt 205.1 lb

## 2020-06-11 DIAGNOSIS — C50919 Malignant neoplasm of unspecified site of unspecified female breast: Secondary | ICD-10-CM | POA: Diagnosis not present

## 2020-06-11 DIAGNOSIS — Z79899 Other long term (current) drug therapy: Secondary | ICD-10-CM | POA: Insufficient documentation

## 2020-06-11 DIAGNOSIS — C50912 Malignant neoplasm of unspecified site of left female breast: Secondary | ICD-10-CM | POA: Insufficient documentation

## 2020-06-11 DIAGNOSIS — C7951 Secondary malignant neoplasm of bone: Secondary | ICD-10-CM | POA: Diagnosis not present

## 2020-06-11 DIAGNOSIS — Z170 Estrogen receptor positive status [ER+]: Secondary | ICD-10-CM | POA: Diagnosis not present

## 2020-06-11 DIAGNOSIS — Z79811 Long term (current) use of aromatase inhibitors: Secondary | ICD-10-CM | POA: Insufficient documentation

## 2020-06-11 DIAGNOSIS — Z7983 Long term (current) use of bisphosphonates: Secondary | ICD-10-CM

## 2020-06-11 DIAGNOSIS — Z5181 Encounter for therapeutic drug level monitoring: Secondary | ICD-10-CM

## 2020-06-11 DIAGNOSIS — C50112 Malignant neoplasm of central portion of left female breast: Secondary | ICD-10-CM

## 2020-06-11 LAB — CBC WITH DIFFERENTIAL/PLATELET
Abs Immature Granulocytes: 0.01 10*3/uL (ref 0.00–0.07)
Basophils Absolute: 0.1 10*3/uL (ref 0.0–0.1)
Basophils Relative: 2 %
Eosinophils Absolute: 0.1 10*3/uL (ref 0.0–0.5)
Eosinophils Relative: 2 %
HCT: 34.4 % — ABNORMAL LOW (ref 36.0–46.0)
Hemoglobin: 12.4 g/dL (ref 12.0–15.0)
Immature Granulocytes: 0 %
Lymphocytes Relative: 33 %
Lymphs Abs: 1 10*3/uL (ref 0.7–4.0)
MCH: 38 pg — ABNORMAL HIGH (ref 26.0–34.0)
MCHC: 36 g/dL (ref 30.0–36.0)
MCV: 105.5 fL — ABNORMAL HIGH (ref 80.0–100.0)
Monocytes Absolute: 0.2 10*3/uL (ref 0.1–1.0)
Monocytes Relative: 7 %
Neutro Abs: 1.6 10*3/uL — ABNORMAL LOW (ref 1.7–7.7)
Neutrophils Relative %: 56 %
Platelets: 162 10*3/uL (ref 150–400)
RBC: 3.26 MIL/uL — ABNORMAL LOW (ref 3.87–5.11)
RDW: 14.2 % (ref 11.5–15.5)
WBC: 2.9 10*3/uL — ABNORMAL LOW (ref 4.0–10.5)
nRBC: 0 % (ref 0.0–0.2)

## 2020-06-11 LAB — COMPREHENSIVE METABOLIC PANEL
ALT: 26 U/L (ref 0–44)
AST: 30 U/L (ref 15–41)
Albumin: 4 g/dL (ref 3.5–5.0)
Alkaline Phosphatase: 62 U/L (ref 38–126)
Anion gap: 12 (ref 5–15)
BUN: 15 mg/dL (ref 8–23)
CO2: 25 mmol/L (ref 22–32)
Calcium: 9.8 mg/dL (ref 8.9–10.3)
Chloride: 99 mmol/L (ref 98–111)
Creatinine, Ser: 1.13 mg/dL — ABNORMAL HIGH (ref 0.44–1.00)
GFR, Estimated: 50 mL/min — ABNORMAL LOW (ref >60)
Glucose, Bld: 177 mg/dL — ABNORMAL HIGH (ref 70–99)
Potassium: 3.6 mmol/L (ref 3.5–5.1)
Sodium: 136 mmol/L (ref 135–145)
Total Bilirubin: 1 mg/dL (ref 0.3–1.2)
Total Protein: 7.3 g/dL (ref 6.5–8.1)

## 2020-06-11 MED ORDER — ZOLEDRONIC ACID 4 MG/100ML IV SOLN
4.0000 mg | Freq: Once | INTRAVENOUS | Status: AC
  Administered 2020-06-11: 4 mg via INTRAVENOUS
  Filled 2020-06-11: qty 100

## 2020-06-11 MED ORDER — SODIUM CHLORIDE 0.9% FLUSH
10.0000 mL | Freq: Once | INTRAVENOUS | Status: AC | PRN
  Administered 2020-06-11: 10 mL
  Filled 2020-06-11: qty 10

## 2020-06-11 MED ORDER — HEPARIN SOD (PORK) LOCK FLUSH 100 UNIT/ML IV SOLN
INTRAVENOUS | Status: AC
  Filled 2020-06-11: qty 5

## 2020-06-11 MED ORDER — HEPARIN SOD (PORK) LOCK FLUSH 100 UNIT/ML IV SOLN
500.0000 [IU] | Freq: Once | INTRAVENOUS | Status: AC
  Administered 2020-06-11: 500 [IU] via INTRAVENOUS
  Filled 2020-06-11: qty 5

## 2020-06-11 MED ORDER — HEPARIN SOD (PORK) LOCK FLUSH 100 UNIT/ML IV SOLN
250.0000 [IU] | Freq: Once | INTRAVENOUS | Status: DC | PRN
  Filled 2020-06-11: qty 5

## 2020-06-11 NOTE — Progress Notes (Signed)
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 Patient has been on Ibrance and letrozole since then. She had cytopenias with Leslee Home and is taking 75 mg 3 weeks on 1 week off which was started in September 2018. Patient is on Zometa for bone metastases which she is getting every 3 months.Patient had extensive RLE DVT in April 2019 and is currently on eliquis. She also underwent venous thrombectomy and thrombolytic therapy and IVC placement by Dr. Lucky Cowboy.She also has peripheral vascular disease secondary to atherosclerosis and underwent stenting with vascular surgery as well   Interval history-patient reports doing well overall.  Appetite and weight have remained stable.  She had an episode of muscle cramps which lasted for about 3 to 4 days couple of weeks ago and then resolved on its own.  ECOG PS- 1 Pain scale- 0   Review of systems- Review of Systems  Constitutional: Negative for chills, fever, malaise/fatigue and weight loss.  HENT: Negative for congestion, ear discharge and nosebleeds.   Eyes: Negative for blurred vision.  Respiratory: Negative for cough, hemoptysis, sputum production, shortness of breath and wheezing.   Cardiovascular: Negative for chest pain, palpitations, orthopnea and claudication.  Gastrointestinal: Negative for abdominal pain, blood in  stool, constipation, diarrhea, heartburn, melena, nausea and vomiting.  Genitourinary: Negative for dysuria, flank pain, frequency, hematuria and urgency.  Musculoskeletal: Negative for back pain, joint pain and myalgias.  Skin: Negative for rash.  Neurological: Negative for dizziness, tingling, focal weakness, seizures, weakness and headaches.  Endo/Heme/Allergies: Does not bruise/bleed easily.  Psychiatric/Behavioral: Negative for depression and suicidal ideas. The patient does not have insomnia.       No Known Allergies   Past Medical History:  Diagnosis Date  . Breast cancer ([place])   . Cancer ([place])    left breast  . DVT (deep venous thrombosis) ([place])    2019  . Hypertension   . Port catheter in place 09/14/2016   Placed 09/10/2016 RT chest.     Past Surgical History:  Procedure Laterality Date  . ABDOMINAL HYSTERECTOMY    . APPENDECTOMY    . BREAST BIOPSY    . CYST EXCISION Right 09/2016   Buttocks  . IVC FILTER REMOVAL N/A 01/17/2018   Procedure: IVC FILTER REMOVAL;  Surgeon: Algernon Huxley, MD;  Location: [place] [facility];  Service: Cardiovascular;  Laterality: N/A;  . LOWER EXTREMITY ANGIOGRAPHY Right 11/11/2017   Procedure: LOWER EXTREMITY ANGIOGRAPHY;  Surgeon: Katha Cabal, MD;  Location: [place] [facility];  Service: Cardiovascular;  Laterality: Right;  . PERIPHERAL VASCULAR THROMBECTOMY Right 08/30/2017   Procedure: PERIPHERAL VASCULAR THROMBECTOMY;  Surgeon: Algernon Huxley, MD;  Location: [place] [facility];  Service: Cardiovascular;  Laterality: Right;  . PORTACATH PLACEMENT Right 09/10/2016   Procedure: INSERTION PORT-A-CATH;  Surgeon: Jules Husbands, MD;  Location: [facility];  Service: General;  Laterality: Right;    Social History   Socioeconomic History  .  Marital status: Married    Spouse name: Jeneen Rinks  . Number of children: 2  . Years of education: 6  . Highest education level: Bachelor's degree (e.g., BA, AB, BS)  Occupational History  .  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 Smoking status: Never Smoker  . Smokeless tobacco: Never Used  Vaping Use  . Vaping Use: Never used  Substance and Sexual Activity  . Alcohol use: No  . Drug use: No  . Sexual activity: Never  Other Topics Concern  . Not on file  Social History Narrative  . Not on file   Social Determinants of Health   Financial Resource Strain: Not on file  Food Insecurity: Not on file  Transportation Needs: Not on file  Physical Activity: Not on file  Stress: Not on file  Social Connections: Not on file  Intimate Partner Violence: Not on file    Family History  Problem Relation Age of Onset  . Parkinson's disease Mother   . Bladder Cancer Father   . Lung cancer Father      Current Outpatient Medications:  .  acetaminophen (TYLENOL) 500 MG tablet, Take 500 mg by mouth daily as needed for moderate pain or headache. , Disp: , Rfl:  .  amLODipine (NORVASC) 10 MG tablet, Take 10 mg by mouth daily., Disp: , Rfl:  .  apixaban (ELIQUIS) 5 MG TABS tablet, Take 1 tablet (5 mg total) by mouth 2 (two) times daily., Disp: 60 tablet, Rfl: 6 .  atorvastatin (LIPITOR) 20 MG tablet, Take 20 mg by mouth daily., Disp: , Rfl:  .  Calcium Carb-Cholecalciferol (CALCIUM 600/VITAMIN D3 PO), Take 1 tablet by mouth daily., Disp: , Rfl:  .  hydrochlorothiazide (HYDRODIURIL) 12.5 MG tablet, Take 12.5 mg by mouth daily., Disp: , Rfl:  .  IBRANCE 75 MG tablet, TAKE 1 TABLET (75 MG TOTAL) BY MOUTH DAILY. TAKE FOR 21 DAYS ON, 7 DAYS OFF, REPEAT EVERY 28 DAYS., Disp: 21 tablet, Rfl: 3 .  ibuprofen (ADVIL,MOTRIN) 400 MG tablet, Take 400 mg by mouth 3 (three) times daily as needed. , Disp: , Rfl:  .  letrozole (FEMARA) 2.5 MG tablet, Take 1 tablet (2.5 mg total) by mouth daily., Disp: 90 tablet, Rfl: 3 .  lidocaine-prilocaine (EMLA) cream, Apply 1 application topically as needed (port access)., Disp: 30 g, Rfl: 2 .  losartan (COZAAR) 50 MG  tablet, Take 1 tablet by mouth once a day  for high blood pressure- to replace 100 mg, Disp: , Rfl:  .  PREVIDENT 5000 BOOSTER PLUS 1.1 % PSTE, Place 1 application onto teeth 2 (two) times daily. , Disp: , Rfl:  .  prochlorperazine (COMPAZINE) 10 MG tablet, Take 1 tablet (10 mg total) by mouth every 6 (six) hours as needed (Nausea or vomiting)., Disp: 30 tablet, Rfl: 2 No current facility-administered medications for this visit.  Facility-Administered Medications Ordered in Other Visits:  .  heparin lock flush 100 unit/mL, 250 Units, Intracatheter, Once PRN, Sindy Guadeloupe, MD  Physical exam:  Vitals:   06/11/20 1309  BP: (!) 163/61  Pulse: 80  Resp: 18  Temp: 98.5 F (36.9 C)  TempSrc: Tympanic  SpO2: 100%  Weight: 205 lb 1.6 oz (93 kg)   Physical Exam HENT:     Mouth/Throat:     Mouth: Mucous membranes are moist.     Pharynx: Oropharynx is clear.  Eyes:     Extraocular Movements: EOM normal.  Cardiovascular:     Rate and Rhythm: Normal rate and regular rhythm.     Heart sounds: Normal heart sounds.  Pulmonary:     Effort: Pulmonary effort is normal.     Breath sounds: Normal breath sounds.  Abdominal:     General: Bowel sounds are normal.     Palpations: Abdomen is soft.  Skin:    General: Skin is warm and dry.  Neurological:     Mental Status: She is alert and oriented to person, place, and time.      CMP Latest Ref Rng & Units 06/11/2020  Glucose 70 - 99 mg/dL 177(H)  BUN 8 - 23 mg/dL 15  Creatinine 0.44 - 1.00 mg/dL 1.13(H)  Sodium 135 - 145 mmol/L 136  Potassium 3.5 - 5.1 mmol/L 3.6  Chloride 98 - 111 mmol/L 99  CO2 22 - 32 mmol/L 25  Calcium 8.9 - 10.3 mg/dL 9.8  Total Protein 6.5 - 8.1 g/dL 7.3  Total Bilirubin 0.3 - 1.2 mg/dL 1.0  Alkaline Phos 38 - 126 U/L 62  AST 15 - 41 U/L 30  ALT 0 - 44 U/L 26   CBC Latest Ref Rng & Units 06/11/2020  WBC 4.0 - 10.5 K/uL 2.9(L)  Hemoglobin 12.0 - 15.0 g/dL 12.4  Hematocrit 36.0 - 46.0 % 34.4(L)  Platelets 150 -  400 K/uL 162      Assessment and plan- Patient is a 80 y.o. female with metastatic breast cancer and bone metastases 1 Ibrance and letrozole.  This is a routine follow-up visit  CA 27-29 from today is pending.  Overall patient has done well with letrozole and Ibrance with stable bone metastases.  I will plan to get a repeat CT chest abdomen and pelvis with contrast and bone scan prior to her next visit. 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  Also continue letrozole indefinitely.  I will see her back in 3 months with CBC with differential CMP and CA-125  History of extensive right lower extremity DVT: Continue Eliquis    Visit Diagnosis 1. Bone metastases ([place])   2. High risk medication use   3. Metastatic breast cancer ([place])   4. Encounter for monitoring zoledronic acid therapy   5. Long term (current) use of bisphosphonates      Dr. Randa Evens, MD, MPH [facility] [phone] 06/11/2020 2:39 PM

## 2020-06-11 NOTE — Addendum Note (Signed)
Addended by: Kern Alberta on: 06/11/2020 03:02 PM   Modules accepted: Orders

## 2020-06-12 LAB — CANCER ANTIGEN 27.29: CA 27.29: 63.6 U/mL — ABNORMAL HIGH (ref 0.0–38.6)

## 2020-06-13 ENCOUNTER — Other Ambulatory Visit: Payer: Self-pay | Admitting: Oncology

## 2020-06-13 DIAGNOSIS — C7951 Secondary malignant neoplasm of bone: Secondary | ICD-10-CM

## 2020-06-13 DIAGNOSIS — C50912 Malignant neoplasm of unspecified site of left female breast: Secondary | ICD-10-CM

## 2020-06-17 ENCOUNTER — Other Ambulatory Visit: Payer: Self-pay | Admitting: Nurse Practitioner

## 2020-06-17 ENCOUNTER — Telehealth: Payer: Self-pay | Admitting: *Deleted

## 2020-06-17 MED ORDER — HYDROCOD POLST-CPM POLST ER 10-8 MG/5ML PO SUER
5.0000 mL | Freq: Two times a day (BID) | ORAL | 0 refills | Status: DC | PRN
Start: 2020-06-17 — End: 2020-09-23

## 2020-06-17 MED ORDER — BENZONATATE 100 MG PO CAPS: 100.0000 mg | ORAL_CAPSULE | Freq: Three times a day (TID) | ORAL | 0 refills | Status: DC | PRN

## 2020-06-17 NOTE — Telephone Encounter (Signed)
Pt called and said that he has had hacky cough, not able to sleep good at night because of coughing, no fever and she ahs been using robitussin, vaporizer, and ES tylenol. She did call pcp and would not be able to get in in the next week. Wanted to see if pt could have rx cough syrup. Spoke to [facility] and she said that she will send her in cough med. It has narcotic in so if you take it do not drive. Stop robitussin. Get flonase spary for nose and can use generic version and get allergy pill otc and take one a day. Pt is thankful to have the help and will get meds

## 2020-06-24 ENCOUNTER — Other Ambulatory Visit (INDEPENDENT_AMBULATORY_CARE_PROVIDER_SITE_OTHER): Payer: Self-pay | Admitting: Nurse Practitioner

## 2020-06-24 DIAGNOSIS — I739 Peripheral vascular disease, unspecified: Secondary | ICD-10-CM

## 2020-06-25 ENCOUNTER — Other Ambulatory Visit: Payer: Self-pay

## 2020-06-25 ENCOUNTER — Ambulatory Visit (INDEPENDENT_AMBULATORY_CARE_PROVIDER_SITE_OTHER): Payer: Medicare Other

## 2020-06-25 ENCOUNTER — Ambulatory Visit (INDEPENDENT_AMBULATORY_CARE_PROVIDER_SITE_OTHER): Payer: Medicare Other | Admitting: Vascular Surgery

## 2020-06-25 VITALS — BP 142/88 | HR 88 | Ht 62.0 in | Wt 205.0 lb

## 2020-06-25 DIAGNOSIS — Z95828 Presence of other vascular implants and grafts: Secondary | ICD-10-CM | POA: Diagnosis not present

## 2020-06-25 DIAGNOSIS — I739 Peripheral vascular disease, unspecified: Secondary | ICD-10-CM

## 2020-06-25 DIAGNOSIS — C50112 Malignant neoplasm of central portion of left female breast: Secondary | ICD-10-CM | POA: Diagnosis not present

## 2020-06-25 DIAGNOSIS — Z170 Estrogen receptor positive status [ER+]: Secondary | ICD-10-CM

## 2020-06-25 DIAGNOSIS — I10 Essential (primary) hypertension: Secondary | ICD-10-CM

## 2020-06-25 DIAGNOSIS — I82511 Chronic embolism and thrombosis of right femoral vein: Secondary | ICD-10-CM

## 2020-06-25 NOTE — Assessment & Plan Note (Signed)
Status post thrombectomy about 3 years ago.  No significant pain or swelling post procedure.

## 2020-06-25 NOTE — Progress Notes (Signed)
MRN : [id number]  LUV MISH is a 80 y.o. (10/10/1940) female who presents with chief complaint of  Chief Complaint  Patient presents with  . Follow-up    1 yr U/S   .  History of Present Illness: Patient returns today in follow up of her PAD as well as her previous history of DVT.  She is doing well.  She is having no leg swelling.  She is walking with minimal discomfort in her legs.  No new ulceration or infection.  ABIs today are stable to slightly improved at 0.87 on the right and stable at 0.77 on the left with biphasic waveforms.  Current Outpatient Medications  Medication Sig Dispense Refill  . acetaminophen (TYLENOL) 500 MG tablet Take 500 mg by mouth daily as needed for moderate pain or headache.     Marland Kitchen amLODipine (NORVASC) 10 MG tablet Take 10 mg by mouth daily.    Marland Kitchen atorvastatin (LIPITOR) 20 MG tablet Take 20 mg by mouth daily.    . benzonatate (TESSALON) 100 MG capsule Take 1 capsule (100 mg total) by mouth 3 (three) times daily as needed for cough. 60 capsule 0  . Calcium Carb-Cholecalciferol (CALCIUM 600/VITAMIN D3 PO) Take 1 tablet by mouth daily.    . hydrochlorothiazide (HYDRODIURIL) 12.5 MG tablet Take 12.5 mg by mouth daily.    Leslee Home 75 MG tablet TAKE 1 TABLET (75 MG TOTAL) BY MOUTH DAILY. TAKE FOR 21 DAYS ON, 7 DAYS OFF, REPEAT EVERY 28 DAYS. 21 tablet 3  . ibuprofen (ADVIL,MOTRIN) 400 MG tablet Take 400 mg by mouth 3 (three) times daily as needed.     Marland Kitchen letrozole (FEMARA) 2.5 MG tablet Take 1 tablet by mouth once daily 90 tablet 0  . lidocaine-prilocaine (EMLA) cream Apply 1 application topically as needed (port access). 30 g 2  . losartan (COZAAR) 50 MG tablet Take 1 tablet by mouth once a day  for high blood pressure- to replace 100 mg    . PREVIDENT 5000 BOOSTER PLUS 1.1 % PSTE Place 1 application onto teeth 2 (two) times daily.     . prochlorperazine (COMPAZINE) 10 MG tablet TAKE 1 TABLET BY MOUTH EVERY 6 HOURS AS NEEDED FOR NAUSEA AND VOMITING 30 tablet 0   . apixaban (ELIQUIS) 5 MG TABS tablet Take 1 tablet (5 mg total) by mouth 2 (two) times daily. 60 tablet 6  . chlorpheniramine-HYDROcodone (TUSSIONEX) 10-8 MG/5ML SUER Take 5 mLs by mouth every 12 (twelve) hours as needed for cough. Unresolved by other medications. Do not drive with this medication. (Patient not taking: Reported on 06/25/2020) 140 mL 0   No current facility-administered medications for this visit.    Past Medical History:  Diagnosis Date  . Breast cancer ([place])   . Cancer ([place])    left breast  . DVT (deep venous thrombosis) ([place])    2019  . Hypertension   . Port catheter in place 09/14/2016   Placed 09/10/2016 RT chest.    Past Surgical History:  Procedure Laterality Date  . ABDOMINAL HYSTERECTOMY    . APPENDECTOMY    . BREAST BIOPSY    . CYST EXCISION Right 09/2016   Buttocks  . IVC FILTER REMOVAL N/A 01/17/2018   Procedure: IVC FILTER REMOVAL;  Surgeon: Algernon Huxley, MD;  Location: [place] [facility];  Service: Cardiovascular;  Laterality: N/A;  . LOWER EXTREMITY ANGIOGRAPHY Right 11/11/2017   Procedure: LOWER EXTREMITY ANGIOGRAPHY;  Surgeon: Katha Cabal, MD;  Location: [facility]  INVASIVE [facility];  Service: Cardiovascular;  Laterality: Right;  . PERIPHERAL VASCULAR THROMBECTOMY Right 08/30/2017   Procedure: PERIPHERAL VASCULAR THROMBECTOMY;  Surgeon: Algernon Huxley, MD;  Location: [place] [facility];  Service: Cardiovascular;  Laterality: Right;  . PORTACATH PLACEMENT Right 09/10/2016   Procedure: INSERTION PORT-A-CATH;  Surgeon: Jules Husbands, MD;  Location: [facility] OR[facility];  Service: General;  Laterality: Right;     Social History   Tobacco Use  . Smoking status: Never Smoker  . Smokeless tobacco: Never Used  Vaping Use  . Vaping Use: Never used  Substance Use Topics  . Alcohol use: No  . Drug use: No      Family History  Problem Relation Age of Onset  . Parkinson's disease Mother   . Bladder Cancer Father   . Lung cancer Father      No Known  Allergies   REVIEW OF SYSTEMS(Negative unless checked)  Constitutional: [] ???Weight loss[] ???Fever[] ???Chills Cardiac:[] ???Chest pain[] ???Chest pressure[] ???Palpitations [] ???Shortness of breath when laying flat [] ???Shortness of breath at rest [] ???Shortness of breath with exertion. Vascular: [] ???Pain in legs with walking[] ???Pain in legsat rest[] ???Pain in legs when laying flat [] ???Claudication [] ???Pain in feet when walking [] ???Pain in feet at rest [] ???Pain in feet when laying flat [x] ???History of DVT [x] ???Phlebitis [x] ???Swelling in legs [] ???Varicose veins [] ???Non-healing ulcers Pulmonary: [] ???Uses home oxygen [] ???Productive cough[] ???Hemoptysis [] ???Wheeze [] ???COPD [] ???Asthma Neurologic: [] ???Dizziness [] ???Blackouts [] ???Seizures [] ???History of stroke [] ???History of TIA[] ???Aphasia [] ???Temporary blindness[] ???Dysphagia [] ???Weaknessor numbness in arms [] ???Weakness or numbnessin legs Musculoskeletal: [x] ???Arthritis [] ???Joint swelling [] ???Joint pain [] ???Low back pain Hematologic:[] ???Easy bruising[] ???Easy bleeding [] ???Hypercoagulable state [] ???Anemic [] ???Hepatitis Gastrointestinal:[] ???Blood in stool[] ???Vomiting blood[] ???Gastroesophageal reflux/heartburn[] ???Abdominal pain Genitourinary: [] ???Chronic kidney disease [] ???Difficulturination [] ???Frequenturination [] ???Burning with urination[] ???Hematuria Skin: [] ???Rashes [] ???Ulcers [] ???Wounds Psychological: [] ???History of anxiety[] ???History of major depression.  Physical Examination  BP (!) 142/88   Pulse 88   Ht 5\' 2"  (1.575 m)   Wt 205 lb (93 kg)   BMI 37.49 kg/m  Gen:  WD/WN, NAD Head: [place]/AT, No temporalis wasting. Ear/Nose/Throat: Hearing grossly intact, nares w/o erythema or drainage Eyes: Conjunctiva clear. Sclera non-icteric Neck: Supple.  Trachea midline Pulmonary:  Good air  movement, no use of accessory muscles.  Cardiac: RRR, no JVD Vascular:  Vessel Right Left  Radial Palpable Palpable                          PT Palpable Palpable  DP Palpable Palpable    Musculoskeletal: M/S 5/5 throughout.  No deformity or atrophy.  No edema. Neurologic: Sensation grossly intact in extremities.  Symmetrical.  Speech is fluent.  Psychiatric: Judgment intact, Mood & affect appropriate for pt's clinical situation. Dermatologic: No rashes or ulcers noted.  No cellulitis or open wounds.       Labs Recent Results (from the past 2160 hour(s))  Cancer antigen 27.29     Status: Abnormal   Collection Time: 06/11/20 12:44 PM  Result Value Ref Range   CA 27.29 63.6 (H) 0.0 - 38.6 U/mL    Comment: (NOTE) Siemens Centaur Immunochemiluminometric Methodology (ICMA) Values obtained with different assay methods or kits cannot be used interchangeably. Results cannot be interpreted as absolute evidence of the presence or absence of malignant disease. Performed At: [facility] [place], [place] [id number] Rush Farmer MD WU:[id number]   Comprehensive metabolic panel     Status: Abnormal   Collection Time: 06/11/20 12:44 PM  Result Value Ref Range   Sodium 136 135 - 145 mmol/L   Potassium 3.6 3.5 - 5.1 mmol/L  Chloride 99 98 - 111 mmol/L   CO2 25 22 - 32 mmol/L   Glucose, Bld 177 (H) 70 - 99 mg/dL    Comment: Glucose reference range applies only to samples taken after fasting for at least 8 hours.   BUN 15 8 - 23 mg/dL   Creatinine, Ser 1.13 (H) 0.44 - 1.00 mg/dL   Calcium 9.8 8.9 - 10.3 mg/dL   Total Protein 7.3 6.5 - 8.1 g/dL   Albumin 4.0 3.5 - 5.0 g/dL   AST 30 15 - 41 U/L   ALT 26 0 - 44 U/L   Alkaline Phosphatase 62 38 - 126 U/L   Total Bilirubin 1.0 0.3 - 1.2 mg/dL   GFR, Estimated 50 (L) >60 mL/min    Comment: (NOTE) Calculated using the CKD-EPI Creatinine Equation (2021)    Anion gap 12 5 - 15    Comment: Performed at [facility], [address] [place], [place] [address]  CBC with Differential     Status: Abnormal   Collection Time: 06/11/20 12:44 PM  Result Value Ref Range   WBC 2.9 (L) 4.0 - 10.5 K/uL   RBC 3.26 (L) 3.87 - 5.11 MIL/uL   Hemoglobin 12.4 12.0 - 15.0 g/dL   HCT 34.4 (L) 36.0 - 46.0 %   MCV 105.5 (H) 80.0 - 100.0 fL   MCH 38.0 (H) 26.0 - 34.0 pg   MCHC 36.0 30.0 - 36.0 g/dL   RDW 14.2 11.5 - 15.5 %   Platelets 162 150 - 400 K/uL   nRBC 0.0 0.0 - 0.2 %   Neutrophils Relative % 56 %   Neutro Abs 1.6 (L) 1.7 - 7.7 K/uL   Lymphocytes Relative 33 %   Lymphs Abs 1.0 0.7 - 4.0 K/uL   Monocytes Relative 7 %   Monocytes Absolute 0.2 0.1 - 1.0 K/uL   Eosinophils Relative 2 %   Eosinophils Absolute 0.1 0.0 - 0.5 K/uL   Basophils Relative 2 %   Basophils Absolute 0.1 0.0 - 0.1 K/uL   Immature Granulocytes 0 %   Abs Immature Granulocytes 0.01 0.00 - 0.07 K/uL    Comment: Performed at [facility], [address] [place], [place] [address]    Radiology No results found.  Assessment/Plan Breast cancer in female [facility]) This increases her clotting risk and is likely a cause of her being hypercoagulable.  Hypertension blood pressure control important in reducing the progression of atherosclerotic disease. On appropriate oral medications.   DVT (deep venous thrombosis) ([place]) Status post thrombectomy about 3 years ago.  No significant pain or swelling post procedure.  PAD (peripheral artery disease) ([facility]) ABIs today are stable to slightly improved at 0.87 on the right and stable at 0.77 on the left with biphasic waveforms.  Doing well.  No role for intervention.  Continue current medical regimen.  Recheck in 1 year    Leotis Pain, MD  06/25/2020 3:29 PM    This note was created with Dragon medical transcription system.  Any errors from dictation are purely unintentional

## 2020-06-25 NOTE — Assessment & Plan Note (Signed)
ABIs today are stable to slightly improved at 0.87 on the right and stable at 0.77 on the left with biphasic waveforms.  Doing well.  No role for intervention.  Continue current medical regimen.  Recheck in 1 year

## 2020-07-09 ENCOUNTER — Other Ambulatory Visit (HOSPITAL_COMMUNITY): Payer: Self-pay

## 2020-08-29 ENCOUNTER — Other Ambulatory Visit: Payer: Self-pay | Admitting: Pharmacist

## 2020-08-29 DIAGNOSIS — C50912 Malignant neoplasm of unspecified site of left female breast: Secondary | ICD-10-CM

## 2020-08-29 MED ORDER — PALBOCICLIB 75 MG PO TABS: 75.0000 mg | ORAL_TABLET | Freq: Every day | ORAL | 3 refills | Status: DC

## 2020-08-30 ENCOUNTER — Other Ambulatory Visit: Payer: Self-pay

## 2020-08-30 DIAGNOSIS — C50912 Malignant neoplasm of unspecified site of left female breast: Secondary | ICD-10-CM

## 2020-08-30 MED ORDER — PALBOCICLIB 75 MG PO TABS: 75.0000 mg | ORAL_TABLET | Freq: Every day | ORAL | 3 refills | Status: DC

## 2020-09-02 ENCOUNTER — Other Ambulatory Visit: Payer: Self-pay | Admitting: *Deleted

## 2020-09-02 ENCOUNTER — Encounter: Payer: Self-pay | Admitting: Oncology

## 2020-09-02 DIAGNOSIS — C50912 Malignant neoplasm of unspecified site of left female breast: Secondary | ICD-10-CM

## 2020-09-02 MED ORDER — PALBOCICLIB 75 MG PO TABS: 75.0000 mg | ORAL_TABLET | Freq: Every day | ORAL | 3 refills | Status: DC

## 2020-09-08 ENCOUNTER — Other Ambulatory Visit: Payer: Self-pay | Admitting: Oncology

## 2020-09-08 DIAGNOSIS — C7951 Secondary malignant neoplasm of bone: Secondary | ICD-10-CM

## 2020-09-10 ENCOUNTER — Encounter: Payer: Self-pay | Admitting: Oncology

## 2020-09-11 ENCOUNTER — Other Ambulatory Visit: Payer: Self-pay

## 2020-09-11 ENCOUNTER — Ambulatory Visit
Admission: RE | Admit: 2020-09-11 | Discharge: 2020-09-11 | Disposition: A | Discharge status: Home / Self Care | Payer: Medicare Other | Source: Ambulatory Visit | Attending: Oncology | Admitting: Oncology

## 2020-09-11 ENCOUNTER — Encounter
Admission: RE | Admit: 2020-09-11 | Discharge: 2020-09-11 | Disposition: A | Discharge status: Home / Self Care | Payer: Medicare Other | Source: Ambulatory Visit | Attending: Oncology | Admitting: Oncology

## 2020-09-11 DIAGNOSIS — Z79899 Other long term (current) drug therapy: Secondary | ICD-10-CM | POA: Insufficient documentation

## 2020-09-11 DIAGNOSIS — C7951 Secondary malignant neoplasm of bone: Secondary | ICD-10-CM | POA: Insufficient documentation

## 2020-09-11 DIAGNOSIS — C50919 Malignant neoplasm of unspecified site of unspecified female breast: Secondary | ICD-10-CM | POA: Diagnosis present

## 2020-09-11 MED ORDER — TECHNETIUM TC 99M MEDRONATE IV KIT
20.0000 | PACK | Freq: Once | INTRAVENOUS | Status: AC | PRN
  Administered 2020-09-11: 20.41 via INTRAVENOUS

## 2020-09-12 ENCOUNTER — Other Ambulatory Visit: Payer: Self-pay | Admitting: *Deleted

## 2020-09-12 ENCOUNTER — Telehealth: Payer: Self-pay | Admitting: *Deleted

## 2020-09-12 DIAGNOSIS — C7951 Secondary malignant neoplasm of bone: Secondary | ICD-10-CM

## 2020-09-12 MED ORDER — LETROZOLE 2.5 MG PO TABS: 2.5000 mg | ORAL_TABLET | Freq: Every day | ORAL | 2 refills | Status: DC

## 2020-09-12 NOTE — Telephone Encounter (Signed)
Pt called and needs refills with her letrozole. I sent in refills and pt knows

## 2020-09-17 ENCOUNTER — Other Ambulatory Visit: Payer: Self-pay

## 2020-09-17 ENCOUNTER — Ambulatory Visit
Admission: RE | Admit: 2020-09-17 | Discharge: 2020-09-17 | Disposition: A | Discharge status: Home / Self Care | Payer: Medicare Other | Source: Ambulatory Visit | Attending: Oncology | Admitting: Oncology

## 2020-09-17 DIAGNOSIS — Z79899 Other long term (current) drug therapy: Secondary | ICD-10-CM | POA: Diagnosis present

## 2020-09-17 DIAGNOSIS — C7951 Secondary malignant neoplasm of bone: Secondary | ICD-10-CM | POA: Insufficient documentation

## 2020-09-17 DIAGNOSIS — C50919 Malignant neoplasm of unspecified site of unspecified female breast: Secondary | ICD-10-CM | POA: Insufficient documentation

## 2020-09-17 LAB — POCT I-STAT CREATININE: Creatinine, Ser: 1.2 mg/dL — ABNORMAL HIGH (ref 0.44–1.00)

## 2020-09-17 MED ORDER — IOHEXOL 300 MG/ML  SOLN
100.0000 mL | Freq: Once | INTRAMUSCULAR | Status: AC | PRN
  Administered 2020-09-17: 100 mL via INTRAVENOUS

## 2020-09-18 ENCOUNTER — Other Ambulatory Visit: Payer: Self-pay | Admitting: *Deleted

## 2020-09-18 DIAGNOSIS — C50912 Malignant neoplasm of unspecified site of left female breast: Secondary | ICD-10-CM

## 2020-09-23 ENCOUNTER — Other Ambulatory Visit: Payer: Medicare Other

## 2020-09-23 ENCOUNTER — Other Ambulatory Visit: Payer: Self-pay

## 2020-09-23 ENCOUNTER — Ambulatory Visit: Payer: Medicare Other

## 2020-09-23 ENCOUNTER — Inpatient Hospital Stay (HOSPITAL_BASED_OUTPATIENT_CLINIC_OR_DEPARTMENT_OTHER): Payer: Medicare Other | Admitting: Oncology

## 2020-09-23 ENCOUNTER — Inpatient Hospital Stay: Payer: Medicare Other

## 2020-09-23 ENCOUNTER — Inpatient Hospital Stay: Payer: Medicare Other | Attending: Oncology

## 2020-09-23 ENCOUNTER — Encounter: Payer: Self-pay | Admitting: Oncology

## 2020-09-23 VITALS — BP 137/61 | HR 74 | Temp 97.6°F | Resp 18 | Ht 62.0 in | Wt 204.8 lb

## 2020-09-23 DIAGNOSIS — Z170 Estrogen receptor positive status [ER+]: Secondary | ICD-10-CM | POA: Insufficient documentation

## 2020-09-23 DIAGNOSIS — C7951 Secondary malignant neoplasm of bone: Secondary | ICD-10-CM

## 2020-09-23 DIAGNOSIS — C50912 Malignant neoplasm of unspecified site of left female breast: Secondary | ICD-10-CM

## 2020-09-23 DIAGNOSIS — Z79899 Other long term (current) drug therapy: Secondary | ICD-10-CM

## 2020-09-23 DIAGNOSIS — Z7983 Long term (current) use of bisphosphonates: Secondary | ICD-10-CM | POA: Diagnosis not present

## 2020-09-23 DIAGNOSIS — C50919 Malignant neoplasm of unspecified site of unspecified female breast: Secondary | ICD-10-CM

## 2020-09-23 DIAGNOSIS — Z79811 Long term (current) use of aromatase inhibitors: Secondary | ICD-10-CM | POA: Diagnosis not present

## 2020-09-23 LAB — COMPREHENSIVE METABOLIC PANEL
ALT: 33 U/L (ref 0–44)
AST: 38 U/L (ref 15–41)
Albumin: 4 g/dL (ref 3.5–5.0)
Alkaline Phosphatase: 57 U/L (ref 38–126)
Anion gap: 10 (ref 5–15)
BUN: 15 mg/dL (ref 8–23)
CO2: 27 mmol/L (ref 22–32)
Calcium: 9.9 mg/dL (ref 8.9–10.3)
Chloride: 99 mmol/L (ref 98–111)
Creatinine, Ser: 1.13 mg/dL — ABNORMAL HIGH (ref 0.44–1.00)
GFR, Estimated: 50 mL/min — ABNORMAL LOW (ref >60)
Glucose, Bld: 189 mg/dL — ABNORMAL HIGH (ref 70–99)
Potassium: 3.5 mmol/L (ref 3.5–5.1)
Sodium: 136 mmol/L (ref 135–145)
Total Bilirubin: 1.1 mg/dL (ref 0.3–1.2)
Total Protein: 7.1 g/dL (ref 6.5–8.1)

## 2020-09-23 LAB — CBC WITH DIFFERENTIAL/PLATELET
Abs Immature Granulocytes: 0.01 10*3/uL (ref 0.00–0.07)
Basophils Absolute: 0.1 10*3/uL (ref 0.0–0.1)
Basophils Relative: 2 %
Eosinophils Absolute: 0.1 10*3/uL (ref 0.0–0.5)
Eosinophils Relative: 2 %
HCT: 33.5 % — ABNORMAL LOW (ref 36.0–46.0)
Hemoglobin: 12.1 g/dL (ref 12.0–15.0)
Immature Granulocytes: 0 %
Lymphocytes Relative: 39 %
Lymphs Abs: 1 10*3/uL (ref 0.7–4.0)
MCH: 39.5 pg — ABNORMAL HIGH (ref 26.0–34.0)
MCHC: 36.1 g/dL — ABNORMAL HIGH (ref 30.0–36.0)
MCV: 109.5 fL — ABNORMAL HIGH (ref 80.0–100.0)
Monocytes Absolute: 0.1 10*3/uL (ref 0.1–1.0)
Monocytes Relative: 6 %
Neutro Abs: 1.3 10*3/uL — ABNORMAL LOW (ref 1.7–7.7)
Neutrophils Relative %: 51 %
Platelets: 232 10*3/uL (ref 150–400)
RBC: 3.06 MIL/uL — ABNORMAL LOW (ref 3.87–5.11)
RDW: 14.6 % (ref 11.5–15.5)
Smear Review: NORMAL
WBC: 2.6 10*3/uL — ABNORMAL LOW (ref 4.0–10.5)
nRBC: 0 % (ref 0.0–0.2)

## 2020-09-23 MED ORDER — SODIUM CHLORIDE 0.9 % IV SOLN
INTRAVENOUS | Status: DC
  Filled 2020-09-23: qty 250

## 2020-09-23 MED ORDER — HEPARIN SOD (PORK) LOCK FLUSH 100 UNIT/ML IV SOLN
INTRAVENOUS | Status: AC
  Filled 2020-09-23: qty 5

## 2020-09-23 MED ORDER — ZOLEDRONIC ACID 4 MG/100ML IV SOLN
4.0000 mg | Freq: Once | INTRAVENOUS | Status: AC
  Administered 2020-09-23: 4 mg via INTRAVENOUS
  Filled 2020-09-23: qty 100

## 2020-09-23 MED ORDER — HEPARIN SOD (PORK) LOCK FLUSH 100 UNIT/ML IV SOLN
500.0000 [IU] | Freq: Once | INTRAVENOUS | Status: AC
  Administered 2020-09-23: 500 [IU]
  Filled 2020-09-23: qty 5

## 2020-09-23 MED ORDER — HEPARIN SOD (PORK) LOCK FLUSH 100 UNIT/ML IV SOLN
250.0000 [IU] | Freq: Once | INTRAVENOUS | Status: DC | PRN
  Filled 2020-09-23: qty 5

## 2020-09-23 NOTE — Patient Instructions (Signed)
[facility] [place] [facility]  Discharge Instructions: Thank you for choosing [facility] to provide your oncology and hematology care.  If you have a lab appointment with the Cancer Center, please go directly to the Cancer Center and check in at the registration area.  Wear comfortable clothing and clothing appropriate for easy access to any Portacath or PICC line.   We strive to give you quality time with your provider. You may need to reschedule your appointment if you arrive late (15 or more minutes).  Arriving late affects you and other patients whose appointments are after yours.  Also, if you miss three or more appointments without notifying the office, you may be dismissed from the clinic at the provider's discretion.      For prescription refill requests, have your pharmacy contact our office and allow 72 hours for refills to be completed.    Today you received the following : Zometa    To help prevent nausea and vomiting after your treatment, we encourage you to take your nausea medication as directed.  BELOW ARE SYMPTOMS THAT SHOULD BE REPORTED IMMEDIATELY: . *FEVER GREATER THAN 100.4 F (38 C) OR HIGHER . *CHILLS OR SWEATING . *NAUSEA AND VOMITING THAT IS NOT CONTROLLED WITH YOUR NAUSEA MEDICATION . *UNUSUAL SHORTNESS OF BREATH . *UNUSUAL BRUISING OR BLEEDING . *URINARY PROBLEMS (pain or burning when urinating, or frequent urination) . *BOWEL PROBLEMS (unusual diarrhea, constipation, pain near the anus) . TENDERNESS IN MOUTH AND THROAT WITH OR WITHOUT PRESENCE OF ULCERS (sore throat, sores in mouth, or a toothache) . UNUSUAL RASH, SWELLING OR PAIN  . UNUSUAL VAGINAL DISCHARGE OR ITCHING   Items with * indicate a potential emergency and should be followed up as soon as possible or go to the Emergency Department if any problems should occur.  Please show the CHEMOTHERAPY ALERT CARD or IMMUNOTHERAPY ALERT CARD at check-in to the Emergency  Department and triage nurse.  Should you have questions after your visit or need to cancel or reschedule your appointment, please contact [facility] [place] [facility]  [phone] and follow the prompts.  Office hours are 8:00 a.m. to 4:30 p.m. Monday - Friday. Please note that voicemails left after 4:00 p.m. may not be returned until the following business day.  We are closed weekends and major holidays. You have access to a nurse at all times for urgent questions. Please call the main number to the clinic [phone] and follow the prompts.  For any non-urgent questions, you may also contact your provider using MyChart. We now offer e-Visits for anyone 18 and older to request care online for non-urgent symptoms. For details visit [email or web address].   Also download the MyChart app! Go to the app store, search "MyChart", open the app, select [facility], and log in with your MyChart username and password.  Due to Covid, a mask is required upon entering the hospital/clinic. If you do not have a mask, one will be given to you upon arrival. For doctor visits, patients may have 1 support person aged 18 or older with them. For treatment visits, patients cannot have anyone with them due to current Covid guidelines and our immunocompromised population.  

## 2020-09-24 ENCOUNTER — Encounter: Payer: Self-pay | Admitting: Oncology

## 2020-09-24 LAB — CANCER ANTIGEN 27.29: CA 27.29: 80.3 U/mL — ABNORMAL HIGH (ref 0.0–38.6)

## 2020-09-24 NOTE — Progress Notes (Signed)
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  Patient has been on Ibrance and letrozole since then.  She had cytopenias with [name] and is taking 75 mg 3 weeks on 1 week off which was started in September 2018.  Patient is on Zometa for bone metastases which she is getting every 3 months. Patient had extensive RLE DVT in April 2019 and is currently on eliquis. She also underwent venous thrombectomy and thrombolytic therapy and IVC placement by Dr. Wyn Quaker.  She also has peripheral vascular disease secondary to atherosclerosis and underwent stenting with vascular surgery as well  Interval history-patient reports doing well and denies any specific complaints at this time.  She continues to tolerate Ibrance and letrozole well without any significant side effects  ECOG PS- 1 Pain scale- 0   Review of systems- Review of Systems  Constitutional:  Negative for chills, fever, malaise/fatigue and weight loss.  HENT:  Negative for congestion, ear discharge and nosebleeds.   Eyes:  Negative for blurred vision.  Respiratory:  Negative for cough, hemoptysis, sputum production, shortness of breath and wheezing.   Cardiovascular:  Negative for chest pain, palpitations, orthopnea and claudication.  Gastrointestinal:  Negative for abdominal pain, blood in stool, constipation, diarrhea, heartburn,  melena, nausea and vomiting.  Genitourinary:  Negative for dysuria, flank pain, frequency, hematuria and urgency.  Musculoskeletal:  Negative for back pain, joint pain and myalgias.  Skin:  Negative for rash.  Neurological:  Negative for dizziness, tingling, focal weakness, seizures, weakness and headaches.  Endo/Heme/Allergies:  Does not bruise/bleed easily.  Psychiatric/Behavioral:  Negative for depression and suicidal ideas. The patient does not have insomnia.      No Known Allergies   Past Medical History:  Diagnosis Date   Breast cancer (HCC)    Cancer (HCC)    left breast   DVT (deep venous thrombosis) (HCC)    2019   Hypertension    Port catheter in place 09/14/2016   Placed 09/10/2016 RT chest.     Past Surgical History:  Procedure Laterality Date   ABDOMINAL HYSTERECTOMY     APPENDECTOMY     BREAST BIOPSY     CYST EXCISION Right 09/2016   Buttocks   IVC FILTER REMOVAL N/A 01/17/2018   Procedure: IVC FILTER REMOVAL;  Surgeon: Annice Needy, MD;  Location: [facility] INVASIVE [facility];  Service: Cardiovascular;  Laterality: N/A;   LOWER EXTREMITY ANGIOGRAPHY Right 11/11/2017   Procedure: LOWER EXTREMITY ANGIOGRAPHY;  Surgeon: Renford Dills, MD;  Location: [facility] INV[facility] CV [facility];  Service: Cardiovascular;  Laterality: Right;   PERIPHERAL VASCULAR THROMBECTOMY Right 08/30/2017   Procedure: PERIPHERAL VASCULAR THROMBECTOMY;  Surgeon: Annice Needy, MD;  Location: [facility];  Service: Cardiovascular;  Laterality: Right;   PORTACATH PLACEMENT Right 09/10/2016   Procedure: INSERTION PORT-A-CATH;  Surgeon: Leafy Ro, MD;  Location: [facility] ORS;  Service: General;  Laterality: Right;    Social  History   Socioeconomic History   Marital status: Married    Spouse name: Jeneen Rinks   Number of children: 2   Years of education: 6   Highest education level: Bachelor's degree (e.g., BA, AB, BS)  Occupational History   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[employer]  Tobacco Use   Smoking status: Never   Smokeless tobacco: Never  Vaping Use   Vaping Use: Never used  Substance and Sexual Activity   Alcohol use: No   Drug use: No   Sexual activity: Never  Other Topics Concern   Not on file  Social History Narrative   Not on file   Social Determinants of Health   Financial Resource Strain: Not on file  Food Insecurity: Not on file  Transportation Needs: Not on file  Physical Activity: Not on file  Stress: Not on file  Social Connections: Not on file  Intimate Partner Violence: Not on file    Family History  Problem Relation Age of Onset   Parkinson's disease Mother    Bladder Cancer Father    Lung cancer Father      Current Outpatient Medications:    acetaminophen (TYLENOL) 500 MG tablet, Take 500 mg by mouth daily as needed for moderate pain or headache. , Disp: , Rfl:    amLODipine (NORVASC) 10 MG tablet, Take 10 mg by mouth daily., Disp: , Rfl:    atorvastatin (LIPITOR) 20 MG tablet, Take 20 mg by mouth daily., Disp: , Rfl:    benzonatate (TESSALON) 100 MG capsule, Take 1 capsule (100 mg total) by mouth 3 (three) times daily as needed for cough., Disp: 60 capsule, Rfl: 0   Calcium Carb-Cholecalciferol (CALCIUM 600/VITAMIN D3 PO), Take 1 tablet by mouth daily., Disp: , Rfl:    hydrochlorothiazide (HYDRODIURIL) 12.5 MG tablet, Take 12.5 mg by mouth daily., Disp: , Rfl:    ibuprofen (ADVIL,MOTRIN) 400 MG tablet, Take 400 mg by mouth 3 (three) times daily as needed. , Disp: , Rfl:    letrozole (FEMARA) 2.5 MG tablet, Take 1 tablet (2.5 mg total) by mouth daily., Disp: 90 tablet, Rfl: 2   lidocaine-prilocaine (EMLA) cream, Apply 1 application topically as needed (port access)., Disp: 30 g, Rfl: 2   losartan (COZAAR) 50 MG tablet, Take 1 tablet by mouth once a day  for high blood pressure- to replace 100 mg, Disp: , Rfl:    palbociclib (IBRANCE) 75 MG tablet, Take 1 tablet (75 mg total) by mouth daily. Take for 21 days on, 7 days  off, repeat every 28 days., Disp: 21 tablet, Rfl: 3   PREVIDENT 5000 BOOSTER PLUS 1.1 % PSTE, Place 1 application onto teeth 2 (two) times daily. , Disp: , Rfl:    prochlorperazine (COMPAZINE) 10 MG tablet, TAKE 1 TABLET BY MOUTH EVERY 6 HOURS AS NEEDED FOR NAUSEA AND VOMITING, Disp: 30 tablet, Rfl: 0   apixaban (ELIQUIS) 5 MG TABS tablet, Take 1 tablet (5 mg total) by mouth 2 (two) times daily., Disp: 60 tablet, Rfl: 6  Physical exam:  Vitals:   09/23/20 1302  BP: 137/61  Pulse: 74  Resp: 18  Temp: 97.6 F (36.4 C)  SpO2: 99%  Weight: 204 lb 12.8 oz (92.9 kg)  Height: $Remove'5\' 2"'uIWRGEc$  (1.575 m)   Physical Exam Constitutional:      General: She is not in acute distress. Cardiovascular:     Rate and Rhythm: Normal rate and regular rhythm.     Heart sounds:  Normal heart sounds.  Pulmonary:     Effort: Pulmonary effort is normal.     Breath sounds: Normal breath sounds.  Skin:    General: Skin is warm and dry.  Neurological:     Mental Status: She is alert and oriented to person, place, and time.     CMP Latest Ref Rng & Units 09/23/2020  Glucose 70 - 99 mg/dL 189(H)  BUN 8 - 23 mg/dL 15  Creatinine 0.44 - 1.00 mg/dL 1.13(H)  Sodium 135 - 145 mmol/L 136  Potassium 3.5 - 5.1 mmol/L 3.5  Chloride 98 - 111 mmol/L 99  CO2 22 - 32 mmol/L 27  Calcium 8.9 - 10.3 mg/dL 9.9  Total Protein 6.5 - 8.1 g/dL 7.1  Total Bilirubin 0.3 - 1.2 mg/dL 1.1  Alkaline Phos 38 - 126 U/L 57  AST 15 - 41 U/L 38  ALT 0 - 44 U/L 33   CBC Latest Ref Rng & Units 09/23/2020  WBC 4.0 - 10.5 K/uL 2.6(L)  Hemoglobin 12.0 - 15.0 g/dL 12.1  Hematocrit 36.0 - 46.0 % 33.5(L)  Platelets 150 - 400 K/uL 232    No images are attached to the encounter.  NM Bone Scan Whole Body  Result Date: 09/12/2020 CLINICAL DATA:  Static breast cancer. EXAM: NUCLEAR MEDICINE WHOLE BODY BONE SCAN TECHNIQUE: Whole body anterior and posterior images were obtained approximately 3 hours after intravenous injection of radiopharmaceutical.  RADIOPHARMACEUTICALS:  20.41 mCi Technetium-60m MDP IV COMPARISON:  March 05, 2020. FINDINGS: There is continued abnormal uptake involving the shoulders, knees, ankles and feet consistent with degenerative change. Stable minimally heterogeneous uptake is noted involving the ribs, spine, pelvis and sternum consistent with metastatic disease as noted on prior exam. No new areas of abnormal uptake are noted. IMPRESSION: Stable findings compared with prior exam. No new areas of abnormal uptake are noted to suggest new foci of metastatic disease or worsening existing disease. Electronically Signed   By: Marijo Conception M.D.   On: 09/12/2020 08:02   CT CHEST ABDOMEN PELVIS W CONTRAST  Result Date: 09/17/2020 CLINICAL DATA:  History of breast cancer in a 80 year old female. EXAM: CT CHEST, ABDOMEN, AND PELVIS WITH CONTRAST TECHNIQUE: Multidetector CT imaging of the chest, abdomen and pelvis was performed following the standard protocol during bolus administration of intravenous contrast. CONTRAST:  117mL OMNIPAQUE IOHEXOL 300 MG/ML  SOLN COMPARISON:  March 05, 2020. FINDINGS: CT CHEST FINDINGS Cardiovascular: Calcified and noncalcified atheromatous plaque in the thoracic aorta. RIGHT-sided Port-A-Cath terminating at the caval to atrial junction. Heart size normal without substantial pericardial effusion. Three-vessel coronary artery disease. Central pulmonary vasculature is normal caliber. Mediastinum/Nodes: Thoracic inlet structures are normal. No axillary lymphadenopathy. No internal mammary lymphadenopathy. No mediastinal lymphadenopathy. No hilar lymphadenopathy. Lungs/Pleura: Basilar atelectasis. The no effusion. No consolidation. Airways are patent. No suspicious pulmonary nodule. Musculoskeletal: See below for full musculoskeletal details. Extensive skeletal metastases with dense sclerosis about the bony thorax as on the prior exam. CT ABDOMEN PELVIS FINDINGS Hepatobiliary: Marked hepatic steatosis. No  focal, suspicious hepatic lesion. No pericholecystic stranding. Fatty sparing about the gallbladder fossa a. portal vein is patent. Pancreas: Normal, without mass, inflammation or ductal dilatation. Spleen: Spleen normal size and contour.  No focal splenic lesion. Adrenals/Urinary Tract: Adrenal glands are normal. Symmetric renal enhancement without hydronephrosis. Urinary bladder with smooth contours. Stomach/Bowel: Small hiatal hernia. Colonic diverticulosis. No acute gastrointestinal process. Vascular/Lymphatic: Calcified and noncalcified atheromatous plaque of the abdominal aorta. No aneurysmal dilation of the abdominal aorta. There is  no gastrohepatic or hepatoduodenal ligament lymphadenopathy. No retroperitoneal or mesenteric lymphadenopathy. Reproductive: Post hysterectomy without adnexal masses. Other: No ascites.  No free air. Musculoskeletal: Extensive bony metastatic disease with signs of diffuse sclerosis of visible axial and appendicular skeleton showing no change. IMPRESSION: 1. Extensive bony metastatic disease with signs of diffuse sclerosis of visible axial and appendicular skeleton showing no change. 2. Marked hepatic steatosis. 3. Three-vessel coronary artery disease. 4. Aortic atherosclerosis. Aortic Atherosclerosis (ICD10-I70.0). Electronically Signed   By: Zetta Bills M.D.   On: 09/17/2020 12:00     Assessment and plan- Patient is a 80 y.o. female with metastatic ER positive breast cancer with bone only metastases on letrozole and Ibrance here for routine follow-up and to receive Zometa  I have personally reviewed CT chest abdomen pelvis images independently as well as bone scan and discussed findings with the patient.  Overall there are no findings of recurrent or progressive disease.  She has stable bone metastases.  She has been on letrozole and Ibrance since 2018 and tolerating it well without any significant side effects.  She will continue this until progression or  toxicity.  She has mild drug-induced neutropenia secondary to Ibrance but her ANC remains more than 1  History of right lower extremity DVT: Continue Eliquis  Zometa today and I will see her back in 3 months with CBC with differential CMP and CA 27-29 for her next dose of Zometa   Visit Diagnosis 1. Bone metastases ([place])   2. Primary cancer of left breast with metastasis to other site ([place])   3. High risk medication use   4. Long term (current) use of bisphosphonates      Dr. Randa Evens, MD, MPH [facility] [phone] 09/24/2020 8:26 AM

## 2020-09-27 ENCOUNTER — Telehealth: Payer: Self-pay | Admitting: *Deleted

## 2020-09-27 NOTE — Telephone Encounter (Signed)
Pt called to see if she can get handicap placard. The one I sent her in the mail has never came to her. I asked her if I could make another one and have her come and get in next Monday and she is agreeable to this. I have done it and put it downstairs at the check in desk and pt knows

## 2020-10-07 ENCOUNTER — Other Ambulatory Visit: Payer: Self-pay

## 2020-10-07 ENCOUNTER — Other Ambulatory Visit: Payer: Self-pay | Admitting: Oncology

## 2020-10-08 ENCOUNTER — Other Ambulatory Visit: Payer: Self-pay | Admitting: Oncology

## 2020-10-09 ENCOUNTER — Other Ambulatory Visit: Payer: Self-pay | Admitting: Oncology

## 2020-11-11 ENCOUNTER — Other Ambulatory Visit: Payer: Self-pay | Admitting: *Deleted

## 2020-11-11 MED ORDER — APIXABAN 5 MG PO TABS: 5.0000 mg | ORAL_TABLET | Freq: Two times a day (BID) | ORAL | 6 refills | Status: DC

## 2020-12-18 ENCOUNTER — Other Ambulatory Visit: Payer: Self-pay

## 2020-12-18 ENCOUNTER — Ambulatory Visit
Admission: RE | Admit: 2020-12-18 | Discharge: 2020-12-18 | Disposition: A | Discharge status: Home / Self Care | Payer: Medicare Other | Source: Ambulatory Visit | Attending: Oncology | Admitting: Oncology

## 2020-12-18 DIAGNOSIS — Z780 Asymptomatic menopausal state: Secondary | ICD-10-CM | POA: Diagnosis not present

## 2020-12-18 DIAGNOSIS — C50919 Malignant neoplasm of unspecified site of unspecified female breast: Secondary | ICD-10-CM | POA: Insufficient documentation

## 2020-12-18 DIAGNOSIS — C7951 Secondary malignant neoplasm of bone: Secondary | ICD-10-CM | POA: Insufficient documentation

## 2020-12-18 DIAGNOSIS — Z13820 Encounter for screening for osteoporosis: Secondary | ICD-10-CM | POA: Insufficient documentation

## 2020-12-18 DIAGNOSIS — Z9221 Personal history of antineoplastic chemotherapy: Secondary | ICD-10-CM | POA: Diagnosis not present

## 2020-12-24 ENCOUNTER — Inpatient Hospital Stay (HOSPITAL_BASED_OUTPATIENT_CLINIC_OR_DEPARTMENT_OTHER): Payer: Medicare Other | Admitting: Oncology

## 2020-12-24 ENCOUNTER — Other Ambulatory Visit: Payer: Self-pay

## 2020-12-24 ENCOUNTER — Inpatient Hospital Stay: Payer: Medicare Other

## 2020-12-24 ENCOUNTER — Inpatient Hospital Stay: Payer: Medicare Other | Attending: Oncology

## 2020-12-24 ENCOUNTER — Encounter: Payer: Self-pay | Admitting: Oncology

## 2020-12-24 VITALS — BP 134/53 | HR 79 | Temp 98.2°F | Resp 16 | Wt 204.0 lb

## 2020-12-24 DIAGNOSIS — C50912 Malignant neoplasm of unspecified site of left female breast: Secondary | ICD-10-CM | POA: Diagnosis present

## 2020-12-24 DIAGNOSIS — D702 Other drug-induced agranulocytosis: Secondary | ICD-10-CM

## 2020-12-24 DIAGNOSIS — Z79811 Long term (current) use of aromatase inhibitors: Secondary | ICD-10-CM | POA: Insufficient documentation

## 2020-12-24 DIAGNOSIS — Z170 Estrogen receptor positive status [ER+]: Secondary | ICD-10-CM | POA: Insufficient documentation

## 2020-12-24 DIAGNOSIS — Z7983 Long term (current) use of bisphosphonates: Secondary | ICD-10-CM | POA: Diagnosis not present

## 2020-12-24 DIAGNOSIS — Z79899 Other long term (current) drug therapy: Secondary | ICD-10-CM

## 2020-12-24 DIAGNOSIS — C7951 Secondary malignant neoplasm of bone: Secondary | ICD-10-CM

## 2020-12-24 LAB — CBC WITH DIFFERENTIAL/PLATELET
Abs Immature Granulocytes: 0.01 10*3/uL (ref 0.00–0.07)
Basophils Absolute: 0.1 10*3/uL (ref 0.0–0.1)
Basophils Relative: 2 %
Eosinophils Absolute: 0 10*3/uL (ref 0.0–0.5)
Eosinophils Relative: 2 %
HCT: 34 % — ABNORMAL LOW (ref 36.0–46.0)
Hemoglobin: 12 g/dL (ref 12.0–15.0)
Immature Granulocytes: 0 %
Lymphocytes Relative: 39 %
Lymphs Abs: 1 10*3/uL (ref 0.7–4.0)
MCH: 38.3 pg — ABNORMAL HIGH (ref 26.0–34.0)
MCHC: 35.3 g/dL (ref 30.0–36.0)
MCV: 108.6 fL — ABNORMAL HIGH (ref 80.0–100.0)
Monocytes Absolute: 0.1 10*3/uL (ref 0.1–1.0)
Monocytes Relative: 5 %
Neutro Abs: 1.3 10*3/uL — ABNORMAL LOW (ref 1.7–7.7)
Neutrophils Relative %: 52 %
Platelets: 145 10*3/uL — ABNORMAL LOW (ref 150–400)
RBC: 3.13 MIL/uL — ABNORMAL LOW (ref 3.87–5.11)
RDW: 14.4 % (ref 11.5–15.5)
Smear Review: NORMAL
WBC: 2.5 10*3/uL — ABNORMAL LOW (ref 4.0–10.5)
nRBC: 0 % (ref 0.0–0.2)

## 2020-12-24 LAB — COMPREHENSIVE METABOLIC PANEL
ALT: 32 U/L (ref 0–44)
AST: 41 U/L (ref 15–41)
Albumin: 4 g/dL (ref 3.5–5.0)
Alkaline Phosphatase: 60 U/L (ref 38–126)
Anion gap: 9 (ref 5–15)
BUN: 14 mg/dL (ref 8–23)
CO2: 23 mmol/L (ref 22–32)
Calcium: 9.5 mg/dL (ref 8.9–10.3)
Chloride: 103 mmol/L (ref 98–111)
Creatinine, Ser: 1.05 mg/dL — ABNORMAL HIGH (ref 0.44–1.00)
GFR, Estimated: 55 mL/min — ABNORMAL LOW (ref >60)
Glucose, Bld: 156 mg/dL — ABNORMAL HIGH (ref 70–99)
Potassium: 3.6 mmol/L (ref 3.5–5.1)
Sodium: 135 mmol/L (ref 135–145)
Total Bilirubin: 1.5 mg/dL — ABNORMAL HIGH (ref 0.3–1.2)
Total Protein: 7.2 g/dL (ref 6.5–8.1)

## 2020-12-24 MED ORDER — SODIUM CHLORIDE 0.9% FLUSH
10.0000 mL | Freq: Once | INTRAVENOUS | Status: AC
Start: 2020-12-24 — End: 2020-12-24
  Administered 2020-12-24: 10 mL via INTRAVENOUS
  Filled 2020-12-24: qty 10

## 2020-12-24 MED ORDER — HEPARIN SOD (PORK) LOCK FLUSH 100 UNIT/ML IV SOLN
500.0000 [IU] | Freq: Once | INTRAVENOUS | Status: AC
Start: 2020-12-24 — End: 2020-12-24
  Administered 2020-12-24: 500 [IU] via INTRAVENOUS
  Filled 2020-12-24: qty 5

## 2020-12-25 LAB — CANCER ANTIGEN 27.29: CA 27.29: 67.7 U/mL — ABNORMAL HIGH (ref 0.0–38.6)

## 2020-12-26 ENCOUNTER — Encounter: Payer: Self-pay | Admitting: Oncology

## 2020-12-26 NOTE — Progress Notes (Signed)
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  Patient has been on Ibrance and letrozole since then.  She had cytopenias with Leslee Home and is taking 75 mg 3 weeks on 1 week off which was started in September 2018.  Patient is on Zometa for bone metastases which she is getting every 3 months. Patient had extensive RLE DVT in April 2019 and is currently on eliquis. She also underwent venous thrombectomy and thrombolytic therapy and IVC placement by Dr. Lucky Cowboy.  She also has peripheral vascular disease secondary to atherosclerosis and underwent stenting with vascular surgery as well  Interval history-patient is tolerating Ibrance and letrozole combination well without any significant side effects.  No recurrent infections.  ECOG PS- 1 Pain scale- 0  Review of systems- Review of Systems  Constitutional:  Negative for chills, fever, malaise/fatigue and weight loss.  HENT:  Negative for congestion, ear discharge and nosebleeds.   Eyes:  Negative for blurred vision.  Respiratory:  Negative for cough, hemoptysis, sputum production, shortness of breath and wheezing.   Cardiovascular:  Negative for chest pain, palpitations, orthopnea and claudication.  Gastrointestinal:  Negative for abdominal pain, blood in stool, constipation, diarrhea, heartburn, melena,  nausea and vomiting.  Genitourinary:  Negative for dysuria, flank pain, frequency, hematuria and urgency.  Musculoskeletal:  Negative for back pain, joint pain and myalgias.  Skin:  Negative for rash.  Neurological:  Negative for dizziness, tingling, focal weakness, seizures, weakness and headaches.  Endo/Heme/Allergies:  Does not bruise/bleed easily.  Psychiatric/Behavioral:  Negative for depression and suicidal ideas. The patient does not have insomnia.       No Known Allergies   Past Medical History:  Diagnosis Date   Breast cancer ([place])    Cancer ([place])    left breast   DVT (deep venous thrombosis) ([place])    2019   Hypertension    Port catheter in place 09/14/2016   Placed 09/10/2016 RT chest.     Past Surgical History:  Procedure Laterality Date   ABDOMINAL HYSTERECTOMY     APPENDECTOMY     BREAST BIOPSY     CYST EXCISION Right 09/2016   Buttocks   IVC FILTER REMOVAL N/A 01/17/2018   Procedure: IVC FILTER REMOVAL;  Surgeon: Algernon Huxley, MD;  Location: [facility] [facility];  Service: Cardiovascular;  Laterality: N/A;   LOWER EXTREMITY ANGIOGRAPHY Right 11/11/2017   Procedure: LOWER EXTREMITY ANGIOGRAPHY;  Surgeon: Katha Cabal, MD;  Location: [facility];  Service: Cardiovascular;  Laterality: Right;   PERIPHERAL VASCULAR THROMBECTOMY Right 08/30/2017   Procedure: PERIPHERAL VASCULAR THROMBECTOMY;  Surgeon: Algernon Huxley, MD;  Location: [facility];  Service: Cardiovascular;  Laterality: Right;   PORTACATH PLACEMENT Right 09/10/2016   Procedure: INSERTION PORT-A-CATH;  Surgeon: Jules Husbands, MD;  Location: [facility];  Service: General;  Laterality: Right;    Social History   Socioeconomic History  Marital status: Married    Spouse name: Jeneen Rinks   Number of children: 2   Years of education: 6   Highest education level: Bachelor's degree (e.g., BA, AB, BS)  Occupational History   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[employer]   Tobacco Use   Smoking status: Never   Smokeless tobacco: Never  Vaping Use   Vaping Use: Never used  Substance and Sexual Activity   Alcohol use: No   Drug use: No   Sexual activity: Never  Other Topics Concern   Not on file  Social History Narrative   Not on file   Social Determinants of Health   Financial Resource Strain: Not on file  Food Insecurity: Not on file  Transportation Needs: Not on file  Physical Activity: Not on file  Stress: Not on file  Social Connections: Not on file  Intimate Partner Violence: Not on file    Family History  Problem Relation Age of Onset   Parkinson's disease Mother    Bladder Cancer Father    Lung cancer Father      Current Outpatient Medications:    acetaminophen (TYLENOL) 500 MG tablet, Take 500 mg by mouth daily as needed for moderate pain or headache. , Disp: , Rfl:    amLODipine (NORVASC) 10 MG tablet, Take 10 mg by mouth daily., Disp: , Rfl:    apixaban (ELIQUIS) 5 MG TABS tablet, Take 1 tablet (5 mg total) by mouth 2 (two) times daily., Disp: 60 tablet, Rfl: 6   atorvastatin (LIPITOR) 20 MG tablet, Take 20 mg by mouth daily., Disp: , Rfl:    benzonatate (TESSALON) 100 MG capsule, Take 1 capsule (100 mg total) by mouth 3 (three) times daily as needed for cough., Disp: 60 capsule, Rfl: 0   Calcium Carb-Cholecalciferol (CALCIUM 600/VITAMIN D3 PO), Take 1 tablet by mouth daily., Disp: , Rfl:    hydrochlorothiazide (HYDRODIURIL) 12.5 MG tablet, Take 12.5 mg by mouth daily., Disp: , Rfl:    ibuprofen (ADVIL,MOTRIN) 400 MG tablet, Take 400 mg by mouth 3 (three) times daily as needed. , Disp: , Rfl:    letrozole (FEMARA) 2.5 MG tablet, Take 1 tablet (2.5 mg total) by mouth daily., Disp: 90 tablet, Rfl: 2   lidocaine-prilocaine (EMLA) cream, Apply 1 application topically as needed (port access)., Disp: 30 g, Rfl: 2   losartan (COZAAR) 50 MG tablet, Take 1 tablet by mouth once a day  for high blood pressure- to replace 100 mg, Disp: , Rfl:     palbociclib (IBRANCE) 75 MG tablet, Take 1 tablet (75 mg total) by mouth daily. Take for 21 days on, 7 days off, repeat every 28 days., Disp: 21 tablet, Rfl: 3   PREVIDENT 5000 BOOSTER PLUS 1.1 % PSTE, Place 1 application onto teeth 2 (two) times daily. , Disp: , Rfl:    prochlorperazine (COMPAZINE) 10 MG tablet, TAKE 1 TABLET BY MOUTH EVERY 6 HOURS AS NEEDED FOR NAUSEA AND VOMITING, Disp: 30 tablet, Rfl: 0  Physical exam:  Vitals:   12/24/20 1014  BP: (!) 134/53  Pulse: 79  Resp: 16  Temp: 98.2 F (36.8 C)  SpO2: 100%  Weight: 204 lb 0.6 oz (92.6 kg)   Physical Exam Constitutional:      General: She is not in acute distress. HENT:     Mouth/Throat:     Mouth: Mucous membranes are moist.     Pharynx: Oropharynx is clear.  Cardiovascular:     Rate and  Rhythm: Normal rate and regular rhythm.     Heart sounds: Normal heart sounds.  Pulmonary:     Effort: Pulmonary effort is normal.     Breath sounds: Normal breath sounds.  Abdominal:     General: Bowel sounds are normal.     Palpations: Abdomen is soft.  Skin:    General: Skin is warm and dry.  Neurological:     Mental Status: She is alert and oriented to person, place, and time.     CMP Latest Ref Rng & Units 12/24/2020  Glucose 70 - 99 mg/dL 156(H)  BUN 8 - 23 mg/dL 14  Creatinine 0.44 - 1.00 mg/dL 1.05(H)  Sodium 135 - 145 mmol/L 135  Potassium 3.5 - 5.1 mmol/L 3.6  Chloride 98 - 111 mmol/L 103  CO2 22 - 32 mmol/L 23  Calcium 8.9 - 10.3 mg/dL 9.5  Total Protein 6.5 - 8.1 g/dL 7.2  Total Bilirubin 0.3 - 1.2 mg/dL 1.5(H)  Alkaline Phos 38 - 126 U/L 60  AST 15 - 41 U/L 41  ALT 0 - 44 U/L 32   CBC Latest Ref Rng & Units 12/24/2020  WBC 4.0 - 10.5 K/uL 2.5(L)  Hemoglobin 12.0 - 15.0 g/dL 12.0  Hematocrit 36.0 - 46.0 % 34.0(L)  Platelets 150 - 400 K/uL 145(L)    No images are attached to the encounter.  DG Bone Density  Result Date: 12/18/2020 EXAM: DUAL X-RAY ABSORPTIOMETRY (DXA) FOR BONE MINERAL DENSITY  IMPRESSION: Your patient Jahmiyah Dullea completed a BMD test on 12/18/2020 using the [place] (software version: 14.10) manufactured by [employer]. The following summarizes the results of our evaluation. Technologist: [facility] PATIENT BIOGRAPHICAL: Name: Lehua, Flores Patient ID: [id number] Birth Date: [birth year] Height: 62.0 in. Gender: Female Exam Date: 12/18/2020 Weight: 204.8 lbs. Indications: Advanced Age, bone metastases, Breast CA, Caucasian, History of Chemo, Hysterectomy, Oophorectomy Unilateral, Postmenopausal Fractures: Treatments: Calcium, calcium w/ vit D, Eliqus, Femara, Vitamin D DENSITOMETRY RESULTS: Site         Region     Measured Date Measured Age WHO Classification Young Adult T-score BMD         %Change vs. Previous Significant Change (*) DualFemur Neck Left 12/18/2020 77.7 Normal 0.2 1.072 g/cm2 9.1% Yes DualFemur Neck Left 12/01/2016 73.7 Normal -0.4 0.983 g/cm2 - - DualFemur Total Mean 12/18/2020 77.7 Normal 2.6 1.334 g/cm2 13.3% Yes DualFemur Total Mean 12/01/2016 73.7 Normal 1.3 1.177 g/cm2 - - Left Forearm Radius 33% 12/18/2020 77.7 Normal -0.7 0.815 g/cm2 -2.9% - Left Forearm Radius 33% 12/01/2016 73.7 Normal -0.4 0.839 g/cm2 - - ASSESSMENT: The BMD measured at Forearm Radius 33% is 0.815 g/cm2 with a T-score of -0.7. This patient is considered normal according to [facility]) criteria. The scan quality is good. Lumbar spine was not utilized due to advanced degenerative changes. Compared with prior study, there has been a significant increase in the total hip. World Pharmacologist [facility]) criteria for post-menopausal, Caucasian Women: Normal:                   T-score at or above -1 SD Osteopenia/low bone mass: T-score between -1 and -2.5 SD Osteoporosis:             T-score at or below -2.5 SD RECOMMENDATIONS: 1. All patients should optimize calcium and vitamin D intake. 2. Consider FDA-approved medical therapies in postmenopausal women and men  aged 76 years and older, based on the following: a. A hip or  vertebral(clinical or morphometric) fracture b. T-score < -2.5 at the femoral neck or spine after appropriate evaluation to exclude secondary causes c. Low bone mass (T-score between -1.0 and -2.5 at the femoral neck or spine) and a 10-year probability of a hip fracture > 3% or a 10-year probability of a major osteoporosis-related fracture > 20% based on the [place]-adapted WHO algorithm 3. Clinician judgment and/or patient preferences may indicate treatment for people with 10-year fracture probabilities above or below these levels FOLLOW-UP: People with diagnosed cases of osteoporosis or at high risk for fracture should have regular bone mineral density tests. For patients eligible for Medicare, routine testing is allowed once every 2 years. The testing frequency can be increased to one year for patients who have rapidly progressing disease, those who are receiving or discontinuing medical therapy to restore bone mass, or have additional risk factors. I have reviewed this report, and agree with the above findings. [facility] Electronically Signed   By: Rolm Baptise M.D.   On: 12/18/2020 19:53     Assessment and plan- Patient is a 80 y.o. female with metastatic ER positive breast cancer with bone only metastases on letrozole and Ibrance.  She is here for routine follow-up and to receive Zometa  Clinically patient is tolerating letrozole plus Ibrance well without any significant side effects.  She has mild drug-induced neutropenia but her ANC remains more than 1.She is also receiving Zometa every 3 months and will receive her next dose today.  CBC with differential CMP CA 27-29 in 3 months and will get Zometa at that time and see me as well.  She will need CT chest abdomen and pelvis with contrast and bone scan prior     Visit Diagnosis 1. Primary cancer of left breast with metastasis to other site [facility])   2. High risk medication use    3. Bone metastases ([place])   4. Long term (current) use of bisphosphonates   5. Drug-induced neutropenia ([place])      Dr. Randa Evens, MD, MPH [facility] [phone] 12/26/2020 5:13 PM

## 2021-01-29 ENCOUNTER — Encounter: Payer: Self-pay | Admitting: Oncology

## 2021-02-03 ENCOUNTER — Encounter: Payer: Self-pay | Admitting: Oncology

## 2021-02-18 ENCOUNTER — Telehealth: Payer: Self-pay | Admitting: *Deleted

## 2021-02-18 ENCOUNTER — Other Ambulatory Visit: Payer: Self-pay | Admitting: *Deleted

## 2021-02-18 DIAGNOSIS — C50912 Malignant neoplasm of unspecified site of left female breast: Secondary | ICD-10-CM

## 2021-02-18 MED ORDER — PALBOCICLIB 75 MG PO TABS: 75.0000 mg | ORAL_TABLET | Freq: Every day | ORAL | 3 refills | Status: DC

## 2021-02-18 NOTE — Telephone Encounter (Signed)
Pt called and said that this is her off week from taking Ibrance but when she called to get next rx was told that she does not have any more refills. I told pt that I will refill the med and call alyson so she will make sure the Rx gets done. Then I called alyson and she looked to see the rx was refilled and she will call the pharmacy to make sure the rx is done

## 2021-02-18 NOTE — Progress Notes (Signed)
Pt called and needed more refills of her ibrance. She was told that the ibrance has no more refills. I will enter them and let alyson know to work on it.

## 2021-02-28 ENCOUNTER — Telehealth: Payer: Self-pay | Admitting: Pharmacy Technician

## 2021-02-28 NOTE — Telephone Encounter (Signed)
Oral Oncology Patient Advocate Encounter   Was successful in securing patient a $6000 grant from [place] to provide copayment coverage for Ibrance.  This will keep the out of pocket expense at $0.     The billing information is as follows and has been shared with [place].   Member ID: [id number] Group ID: [id number] RxBin: [id number] PCN: [id number] Dates of Eligibility: 02/19/21 through 02/19/22  Fund name:  [employer] [place] Patient [place] Phone [phone] Fax [phone] 02/28/2021 11:24 AM

## 2021-03-20 ENCOUNTER — Other Ambulatory Visit (HOSPITAL_COMMUNITY): Payer: Self-pay

## 2021-03-20 ENCOUNTER — Encounter: Payer: Self-pay | Admitting: Oncology

## 2021-03-31 ENCOUNTER — Ambulatory Visit
Admission: RE | Admit: 2021-03-31 | Discharge: 2021-03-31 | Disposition: A | Discharge status: Home / Self Care | Payer: Medicare Other | Source: Ambulatory Visit | Attending: Oncology | Admitting: Oncology

## 2021-03-31 ENCOUNTER — Encounter
Admission: RE | Admit: 2021-03-31 | Discharge: 2021-03-31 | Disposition: A | Discharge status: Home / Self Care | Payer: Medicare Other | Source: Ambulatory Visit | Attending: Oncology | Admitting: Oncology

## 2021-03-31 ENCOUNTER — Other Ambulatory Visit: Payer: Self-pay

## 2021-03-31 DIAGNOSIS — C50912 Malignant neoplasm of unspecified site of left female breast: Secondary | ICD-10-CM | POA: Insufficient documentation

## 2021-03-31 LAB — POCT I-STAT CREATININE: Creatinine, Ser: 1.1 mg/dL — ABNORMAL HIGH (ref 0.44–1.00)

## 2021-03-31 MED ORDER — TECHNETIUM TC 99M MEDRONATE IV KIT
20.0000 | PACK | Freq: Once | INTRAVENOUS | Status: AC | PRN
  Administered 2021-03-31: 08:00:00 21.7 via INTRAVENOUS

## 2021-03-31 MED ORDER — IOHEXOL 300 MG/ML  SOLN
100.0000 mL | Freq: Once | INTRAMUSCULAR | Status: AC | PRN
  Administered 2021-03-31: 08:00:00 100 mL via INTRAVENOUS

## 2021-04-09 ENCOUNTER — Inpatient Hospital Stay (HOSPITAL_BASED_OUTPATIENT_CLINIC_OR_DEPARTMENT_OTHER): Payer: Medicare Other | Admitting: Oncology

## 2021-04-09 ENCOUNTER — Other Ambulatory Visit: Payer: Self-pay

## 2021-04-09 ENCOUNTER — Inpatient Hospital Stay: Payer: Medicare Other | Attending: Oncology

## 2021-04-09 ENCOUNTER — Encounter: Payer: Self-pay | Admitting: Oncology

## 2021-04-09 ENCOUNTER — Inpatient Hospital Stay: Payer: Medicare Other

## 2021-04-09 VITALS — BP 155/55 | HR 60 | Temp 97.3°F | Resp 16 | Wt 202.0 lb

## 2021-04-09 DIAGNOSIS — Z7983 Long term (current) use of bisphosphonates: Secondary | ICD-10-CM

## 2021-04-09 DIAGNOSIS — Z79811 Long term (current) use of aromatase inhibitors: Secondary | ICD-10-CM

## 2021-04-09 DIAGNOSIS — C7951 Secondary malignant neoplasm of bone: Secondary | ICD-10-CM | POA: Diagnosis not present

## 2021-04-09 DIAGNOSIS — Z79899 Other long term (current) drug therapy: Secondary | ICD-10-CM

## 2021-04-09 DIAGNOSIS — C50912 Malignant neoplasm of unspecified site of left female breast: Secondary | ICD-10-CM

## 2021-04-09 DIAGNOSIS — Z170 Estrogen receptor positive status [ER+]: Secondary | ICD-10-CM | POA: Diagnosis not present

## 2021-04-09 DIAGNOSIS — Z95828 Presence of other vascular implants and grafts: Secondary | ICD-10-CM

## 2021-04-09 LAB — CBC WITH DIFFERENTIAL/PLATELET
Basophils Relative: 2 %
Eosinophils Absolute: 0.1 10*3/uL (ref 0.0–0.5)
Eosinophils Relative: 3 %
HCT: 34.4 % — ABNORMAL LOW (ref 36.0–46.0)
Hemoglobin: 12.1 g/dL (ref 12.0–15.0)
Immature Granulocytes: 1 %
Lymphocytes Relative: 28 %
Lymphs Abs: 0.7 10*3/uL (ref 0.7–4.0)
MCH: 37.6 pg — ABNORMAL HIGH (ref 26.0–34.0)
MCHC: 35.2 g/dL (ref 30.0–36.0)
MCV: 106.8 fL — ABNORMAL HIGH (ref 80.0–100.0)
Monocytes Absolute: 0.2 10*3/uL (ref 0.1–1.0)
Monocytes Relative: 7 %
Neutro Abs: 1.5 10*3/uL — ABNORMAL LOW (ref 1.7–7.7)
Neutrophils Relative %: 60 %
Platelets: 224 10*3/uL (ref 150–400)
RBC: 3.22 MIL/uL — ABNORMAL LOW (ref 3.87–5.11)
RDW: 14.5 % (ref 11.5–15.5)
Smear Review: ADEQUATE
WBC: 2.6 10*3/uL — ABNORMAL LOW (ref 4.0–10.5)
nRBC: 0 % (ref 0.0–0.2)

## 2021-04-09 LAB — COMPREHENSIVE METABOLIC PANEL
ALT: 26 U/L (ref 0–44)
AST: 33 U/L (ref 15–41)
Albumin: 3.8 g/dL (ref 3.5–5.0)
Alkaline Phosphatase: 60 U/L (ref 38–126)
Anion gap: 9 (ref 5–15)
BUN: 15 mg/dL (ref 8–23)
CO2: 25 mmol/L (ref 22–32)
Calcium: 9.9 mg/dL (ref 8.9–10.3)
Chloride: 102 mmol/L (ref 98–111)
Creatinine, Ser: 0.81 mg/dL (ref 0.44–1.00)
GFR, Estimated: >60 mL/min (ref >60)
Glucose, Bld: 148 mg/dL — ABNORMAL HIGH (ref 70–99)
Potassium: 3.9 mmol/L (ref 3.5–5.1)
Sodium: 136 mmol/L (ref 135–145)
Total Bilirubin: 1 mg/dL (ref 0.3–1.2)
Total Protein: 6.9 g/dL (ref 6.5–8.1)

## 2021-04-09 MED ORDER — ZOLEDRONIC ACID 4 MG/100ML IV SOLN
4.0000 mg | Freq: Once | INTRAVENOUS | Status: AC
  Administered 2021-04-09: 11:00:00 4 mg via INTRAVENOUS
  Filled 2021-04-09: qty 100

## 2021-04-09 MED ORDER — HEPARIN SOD (PORK) LOCK FLUSH 100 UNIT/ML IV SOLN
500.0000 [IU] | Freq: Once | INTRAVENOUS | Status: AC
  Administered 2021-04-09: 11:00:00 500 [IU] via INTRAVENOUS
  Filled 2021-04-09: qty 5

## 2021-04-09 MED ORDER — SODIUM CHLORIDE 0.9 % IV SOLN
Freq: Once | INTRAVENOUS | Status: AC
  Filled 2021-04-09: qty 250

## 2021-04-09 NOTE — Progress Notes (Signed)
Pt in for follow up, denies any concerns or difficulties.

## 2021-04-09 NOTE — Progress Notes (Signed)
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  Discuss CT scan results and further management  Heme/Onc history: Patient is a 80 year old female who was diagnosed with left breast cancer ER/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  Patient has been on Ibrance and letrozole since then.  She had cytopenias with Leslee Home and is taking 75 mg 3 weeks on 1 week off which was started in September 2018.  Patient is on Zometa for bone metastases which she is getting every 3 months. Patient had extensive RLE DVT in April 2019 and is currently on eliquis. She also underwent venous thrombectomy and thrombolytic therapy and IVC placement by Dr. Lucky Cowboy.  She also has peripheral vascular disease secondary to atherosclerosis and underwent stenting with vascular surgery as well  Interval history-patient is tolerating letrozole and Ibrance well.  Denies any specific complaints at this time.  She is also taking her calcium and vitamin D regularly  ECOG PS- 1 Pain scale- 0   Review of systems- Review of Systems  Constitutional:  Negative for chills, fever, malaise/fatigue and weight loss.  HENT:  Negative for congestion, ear discharge and nosebleeds.   Eyes:  Negative for blurred vision.  Respiratory:  Negative for cough, hemoptysis, sputum production, shortness of breath and wheezing.   Cardiovascular:  Negative for chest pain, palpitations, orthopnea and claudication.  Gastrointestinal:  Negative for abdominal pain, blood  in stool, constipation, diarrhea, heartburn, melena, nausea and vomiting.  Genitourinary:  Negative for dysuria, flank pain, frequency, hematuria and urgency.  Musculoskeletal:  Negative for back pain, joint pain and myalgias.  Skin:  Negative for rash.  Neurological:  Negative for dizziness, tingling, focal weakness, seizures, weakness and headaches.  Endo/Heme/Allergies:  Does not bruise/bleed easily.  Psychiatric/Behavioral:  Negative for depression and suicidal ideas. The patient does not have insomnia.      No Known Allergies   Past Medical History:  Diagnosis Date   Breast cancer ([place])    Cancer ([place])    left breast   DVT (deep venous thrombosis) ([place])    2019   Hypertension    Port catheter in place 09/14/2016   Placed 09/10/2016 RT chest.     Past Surgical History:  Procedure Laterality Date   ABDOMINAL HYSTERECTOMY     APPENDECTOMY     BREAST BIOPSY     CYST EXCISION Right 09/2016   Buttocks   IVC FILTER REMOVAL N/A 01/17/2018   Procedure: IVC FILTER REMOVAL;  Surgeon: Algernon Huxley, MD;  Location: [place] [facility];  Service: Cardiovascular;  Laterality: N/A;   LOWER EXTREMITY ANGIOGRAPHY Right 11/11/2017   Procedure: LOWER EXTREMITY ANGIOGRAPHY;  Surgeon: Katha Cabal, MD;  Location: [place] [facility];  Service: Cardiovascular;  Laterality: Right;   PERIPHERAL VASCULAR THROMBECTOMY Right 08/30/2017   Procedure: PERIPHERAL VASCULAR THROMBECTOMY;  Surgeon: Algernon Huxley, MD;  Location: [place] [facility];  Service: Cardiovascular;  Laterality: Right;   PORTACATH PLACEMENT Right 09/10/2016   Procedure: INSERTION PORT-A-CATH;  Surgeon: Jules Husbands, MD;  Location: [facility];  Service:  General;  Laterality: Right;    Social History   Socioeconomic History   Marital status: Married    Spouse name: Jeneen Rinks   Number of children: 2   Years of education: 6   Highest education level: Bachelor's degree (e.g., BA, AB, BS)  Occupational History   Occupation:  RETIRED    Comment: ACCOUNTANT AT [employer]  Tobacco Use   Smoking status: Never   Smokeless tobacco: Never  Vaping Use   Vaping Use: Never used  Substance and Sexual Activity   Alcohol use: No   Drug use: No   Sexual activity: Never  Other Topics Concern   Not on file  Social History Narrative   Not on file   Social Determinants of Health   Financial Resource Strain: Not on file  Food Insecurity: Not on file  Transportation Needs: Not on file  Physical Activity: Not on file  Stress: Not on file  Social Connections: Not on file  Intimate Partner Violence: Not on file    Family History  Problem Relation Age of Onset   Parkinson's disease Mother    Bladder Cancer Father    Lung cancer Father      Current Outpatient Medications:    acetaminophen (TYLENOL) 500 MG tablet, Take 500 mg by mouth daily as needed for moderate pain or headache. , Disp: , Rfl:    amLODipine (NORVASC) 10 MG tablet, Take 10 mg by mouth daily., Disp: , Rfl:    apixaban (ELIQUIS) 5 MG TABS tablet, Take 1 tablet (5 mg total) by mouth 2 (two) times daily., Disp: 60 tablet, Rfl: 6   atorvastatin (LIPITOR) 40 MG tablet, Take 40 mg by mouth at bedtime., Disp: , Rfl:    Calcium Carb-Cholecalciferol (CALCIUM 600/VITAMIN D3 PO), Take 1 tablet by mouth daily., Disp: , Rfl:    hydrochlorothiazide (HYDRODIURIL) 12.5 MG tablet, Take 12.5 mg by mouth daily., Disp: , Rfl:    ibuprofen (ADVIL,MOTRIN) 400 MG tablet, Take 400 mg by mouth 3 (three) times daily as needed. , Disp: , Rfl:    letrozole (FEMARA) 2.5 MG tablet, Take 1 tablet (2.5 mg total) by mouth daily., Disp: 90 tablet, Rfl: 2   lidocaine-prilocaine (EMLA) cream, Apply 1 application topically as needed (port access)., Disp: 30 g, Rfl: 2   losartan (COZAAR) 50 MG tablet, Take 1 tablet by mouth once a day  for high blood pressure- to replace 100 mg, Disp: , Rfl:    palbociclib (IBRANCE) 75 MG tablet, Take 1 tablet (75 mg total) by mouth daily. Take  for 21 days on, 7 days off, repeat every 28 days., Disp: 21 tablet, Rfl: 3   PREVIDENT 5000 BOOSTER PLUS 1.1 % PSTE, Place 1 application onto teeth 2 (two) times daily. , Disp: , Rfl:    prochlorperazine (COMPAZINE) 10 MG tablet, TAKE 1 TABLET BY MOUTH EVERY 6 HOURS AS NEEDED FOR NAUSEA AND VOMITING, Disp: 30 tablet, Rfl: 0  Physical exam:  Vitals:   04/09/21 0953  BP: (!) 155/55  Pulse: 60  Resp: 16  Temp: (!) 97.3 F (36.3 C)  TempSrc: Tympanic  SpO2: 100%  Weight: 202 lb (91.6 kg)   Physical Exam Constitutional:      General: She is not in acute distress. Cardiovascular:     Rate and Rhythm: Normal rate and regular rhythm.     Heart sounds: Normal heart sounds.  Pulmonary:     Effort: Pulmonary effort is normal.     Breath sounds: Normal breath  sounds.  Skin:    General: Skin is warm and dry.  Neurological:     Mental Status: She is alert and oriented to person, place, and time.     CMP Latest Ref Rng & Units 04/09/2021  Glucose 70 - 99 mg/dL 148(H)  BUN 8 - 23 mg/dL 15  Creatinine 0.44 - 1.00 mg/dL 0.81  Sodium 135 - 145 mmol/L 136  Potassium 3.5 - 5.1 mmol/L 3.9  Chloride 98 - 111 mmol/L 102  CO2 22 - 32 mmol/L 25  Calcium 8.9 - 10.3 mg/dL 9.9  Total Protein 6.5 - 8.1 g/dL 6.9  Total Bilirubin 0.3 - 1.2 mg/dL 1.0  Alkaline Phos 38 - 126 U/L 60  AST 15 - 41 U/L 33  ALT 0 - 44 U/L 26   CBC Latest Ref Rng & Units 04/09/2021  WBC 4.0 - 10.5 K/uL 2.6(L)  Hemoglobin 12.0 - 15.0 g/dL 12.1  Hematocrit 36.0 - 46.0 % 34.4(L)  Platelets 150 - 400 K/uL 224    No images are attached to the encounter.  NM Bone Scan Whole Body  Result Date: 03/31/2021 CLINICAL DATA:  Breast cancer with bone metastases EXAM: NUCLEAR MEDICINE WHOLE BODY BONE SCAN TECHNIQUE: Whole body anterior and posterior images were obtained approximately 3 hours after intravenous injection of radiopharmaceutical. RADIOPHARMACEUTICALS:  21.7 mCi Technetium-51mMDP IV COMPARISON:  03/31/2021,  09/11/2020 FINDINGS: There is physiologic excretion of radiotracer within the kidneys and bladder. Stable activity surrounding the bilateral shoulders, knees, and ankles consistent with osteoarthritis. Moderate activity throughout the spine, thoracic cage, and calvarium unchanged since prior study, consistent with known history of diffuse sclerotic metastases. No increase in the intensity of the mottled activity. No new foci of abnormal radiotracer uptake. IMPRESSION: 1. Stable low level model activity throughout the calvarium, spine, and thoracic cage consistent with known bony metastases. 2. No evidence of new or worsening disease. Electronically Signed   By: MRanda NgoM.D.   On: 03/31/2021 12:54   CT CHEST ABDOMEN PELVIS W CONTRAST  Result Date: 03/31/2021 CLINICAL DATA:  Breast cancer with bone metastases.  Restaging EXAM: CT CHEST, ABDOMEN, AND PELVIS WITH CONTRAST TECHNIQUE: Multidetector CT imaging of the chest, abdomen and pelvis was performed following the standard protocol during bolus administration of intravenous contrast. CONTRAST:  1051mOMNIPAQUE IOHEXOL 300 MG/ML  SOLN COMPARISON:  09/17/2020 FINDINGS: CT CHEST FINDINGS Cardiovascular: The heart size is normal. No substantial pericardial effusion. Coronary artery calcification is evident. Mild atherosclerotic calcification is noted in the wall of the thoracic aorta. Right Port-A-Cath tip is positioned in the distal SVC. Mediastinum/Nodes: No mediastinal lymphadenopathy. There is no hilar lymphadenopathy. The esophagus has normal imaging features. There is no axillary lymphadenopathy. Lungs/Pleura: No suspicious pulmonary nodule or mass. No focal airspace consolidation. No pleural effusion. Musculoskeletal: Similar appearance of diffuse sclerotic bone metastases. CT ABDOMEN PELVIS FINDINGS Hepatobiliary: The liver shows diffusely decreased attenuation suggesting fat deposition. No suspicious focal abnormality within the liver parenchyma.  Subcapsular sparing noted along the gallbladder fossa. Tiny gallstone or mucosal polyp noted in the gallbladder. No intrahepatic or extrahepatic biliary dilation. Pancreas: No focal mass lesion. No dilatation of the main duct. No intraparenchymal cyst. No peripancreatic edema. Spleen: No splenomegaly. No focal mass lesion. Adrenals/Urinary Tract: No adrenal nodule or mass. Kidneys unremarkable. No evidence for hydroureter. The urinary bladder appears normal for the degree of distention. Stomach/Bowel: Stomach is unremarkable. No gastric wall thickening. No evidence of outlet obstruction. Duodenum is normally positioned as is the ligament of Treitz. Duodenal  diverticulum noted. No small bowel wall thickening. No small bowel dilatation. The terminal ileum is normal. The appendix is normal. No gross colonic mass. No colonic wall thickening. Diverticular changes are noted in the left colon without evidence of diverticulitis. Vascular/Lymphatic: There is mild atherosclerotic calcification of the abdominal aorta without aneurysm. There is no gastrohepatic or hepatoduodenal ligament lymphadenopathy. No retroperitoneal or mesenteric lymphadenopathy. No pelvic sidewall lymphadenopathy. Reproductive: Uterus surgically absent.  There is no adnexal mass. Other: No intraperitoneal free fluid. Musculoskeletal: Similar appearance diffuse sclerotic bone metastases. IMPRESSION: 1. Stable exam. No new or progressive interval findings. 2. Similar appearance of diffuse sclerotic bone metastases. 3. Hepatic steatosis. 4. Tiny gallstone or mucosal polyp noted in the gallbladder. Attention on follow-up recommended. 5. Aortic Atherosclerosis (ICD10-I70.0). Electronically Signed   By: Misty Stanley M.D.   On: 03/31/2021 11:29     Assessment and plan- Patient is a 80 y.o. female with metastatic ER positive breast cancer with bone only metastases on letrozole and Ibrance.  She is here for routine follow-up of breast cancer and to receive  Zometa  I have reviewed CT chest abdomen pelvis as well as bone scan images independently and discussed findings with the patient.  Scans continue to show stable bony metastatic disease and no evidence of recurrent or progressive disease.  Plan is to continue letrozole plus Ibrance until progression or toxicity.  Bone density scan is normal.  Bone metastases: Patient continues to receive Zometa every 3 months and will receive her next dose today.  I will see her back in 3 months with CBC with differential CMP and CA 27-29 for next dose of Zometa.  Drug-induced neutropenia: Secondary to Ibrance.  ANC greater than 1.  Continue Ibrance at 75 mg 3 weeks on and 1 week off   Visit Diagnosis 1. Primary cancer of left breast with metastasis to other site [facility])   2. Bone metastases ([place])   3. High risk medication use   4. Use of letrozole ([place])   5. Long term (current) use of bisphosphonates      Dr. Randa Evens, MD, MPH [facility] [phone] 04/09/2021 12:39 PM

## 2021-04-10 LAB — CA 27.29 (SERIAL MONITOR): CA 27.29: 71.3 U/mL — ABNORMAL HIGH (ref 0.0–38.6)

## 2021-04-18 ENCOUNTER — Other Ambulatory Visit (HOSPITAL_COMMUNITY): Payer: Self-pay

## 2021-04-18 ENCOUNTER — Other Ambulatory Visit: Payer: Self-pay | Admitting: Pharmacist

## 2021-04-18 ENCOUNTER — Encounter: Payer: Self-pay | Admitting: Oncology

## 2021-04-18 DIAGNOSIS — C50912 Malignant neoplasm of unspecified site of left female breast: Secondary | ICD-10-CM

## 2021-04-18 MED ORDER — PALBOCICLIB 75 MG PO TABS
75.0000 mg | ORAL_TABLET | Freq: Every day | ORAL | 3 refills | Status: DC
  Filled 2021-04-18: qty 21, 28d supply, fill #0
  Filled 2021-04-18: qty 21, 21d supply, fill #0
  Filled 2021-05-15: qty 21, 28d supply, fill #1
  Filled 2021-06-11: qty 21, 28d supply, fill #2
  Filled 2021-07-08: qty 21, 28d supply, fill #3

## 2021-04-21 ENCOUNTER — Other Ambulatory Visit (HOSPITAL_COMMUNITY): Payer: Self-pay

## 2021-04-22 ENCOUNTER — Other Ambulatory Visit (HOSPITAL_COMMUNITY): Payer: Self-pay

## 2021-04-24 ENCOUNTER — Other Ambulatory Visit (HOSPITAL_COMMUNITY): Payer: Self-pay

## 2021-04-29 ENCOUNTER — Other Ambulatory Visit (HOSPITAL_COMMUNITY): Payer: Self-pay

## 2021-05-12 ENCOUNTER — Other Ambulatory Visit (HOSPITAL_COMMUNITY): Payer: Self-pay

## 2021-05-14 ENCOUNTER — Other Ambulatory Visit (HOSPITAL_COMMUNITY): Payer: Self-pay

## 2021-05-15 ENCOUNTER — Other Ambulatory Visit (HOSPITAL_COMMUNITY): Payer: Self-pay

## 2021-05-19 ENCOUNTER — Encounter: Payer: Self-pay | Admitting: Oncology

## 2021-05-19 ENCOUNTER — Other Ambulatory Visit (HOSPITAL_COMMUNITY): Payer: Self-pay

## 2021-06-05 ENCOUNTER — Other Ambulatory Visit (HOSPITAL_COMMUNITY): Payer: Self-pay

## 2021-06-06 ENCOUNTER — Other Ambulatory Visit: Payer: Self-pay

## 2021-06-06 ENCOUNTER — Telehealth: Payer: Self-pay

## 2021-06-06 MED ORDER — BENZONATATE 100 MG PO CAPS: 100.0000 mg | ORAL_CAPSULE | Freq: Three times a day (TID) | ORAL | 0 refills | Status: DC | PRN

## 2021-06-06 NOTE — Telephone Encounter (Signed)
Patient called stating she has had a terrible cough for a couple of weeks, no fever and slight nasal clear drainage. She states this happened last year around the same time and she was ordered Benzonatate and it improved. Are we able to fill it? If so, is 100mg  TID ok? Please advise.

## 2021-06-06 NOTE — Telephone Encounter (Signed)
Called patient informed her Benzonatate was called in to pharmacy. Patient verbalizes understanding. Per patient at home Covid test was negative. No further questions at concerns.

## 2021-06-06 NOTE — Telephone Encounter (Signed)
yes

## 2021-06-11 ENCOUNTER — Other Ambulatory Visit (HOSPITAL_COMMUNITY): Payer: Self-pay

## 2021-06-16 ENCOUNTER — Other Ambulatory Visit: Payer: Self-pay | Admitting: *Deleted

## 2021-06-16 ENCOUNTER — Other Ambulatory Visit (HOSPITAL_COMMUNITY): Payer: Self-pay

## 2021-06-16 MED ORDER — APIXABAN 5 MG PO TABS: 5.0000 mg | ORAL_TABLET | Freq: Two times a day (BID) | ORAL | 6 refills | Status: DC

## 2021-06-17 ENCOUNTER — Telehealth: Payer: Self-pay | Admitting: *Deleted

## 2021-06-17 NOTE — Telephone Encounter (Signed)
Pt called and asked for refill of eliquis. The refill was sent to [facility] and 6 refills. Pt tankful for taking care of it ?

## 2021-06-19 ENCOUNTER — Other Ambulatory Visit (HOSPITAL_COMMUNITY): Payer: Self-pay

## 2021-06-24 ENCOUNTER — Other Ambulatory Visit: Payer: Self-pay

## 2021-06-24 ENCOUNTER — Ambulatory Visit (INDEPENDENT_AMBULATORY_CARE_PROVIDER_SITE_OTHER): Payer: Medicare Other

## 2021-06-24 ENCOUNTER — Ambulatory Visit (INDEPENDENT_AMBULATORY_CARE_PROVIDER_SITE_OTHER): Payer: Medicare Other | Admitting: Vascular Surgery

## 2021-06-24 ENCOUNTER — Encounter (INDEPENDENT_AMBULATORY_CARE_PROVIDER_SITE_OTHER): Payer: Self-pay | Admitting: Vascular Surgery

## 2021-06-24 VITALS — BP 159/70 | HR 79 | Resp 16 | Ht 62.0 in | Wt 200.4 lb

## 2021-06-24 DIAGNOSIS — I10 Essential (primary) hypertension: Secondary | ICD-10-CM | POA: Diagnosis not present

## 2021-06-24 DIAGNOSIS — C50112 Malignant neoplasm of central portion of left female breast: Secondary | ICD-10-CM

## 2021-06-24 DIAGNOSIS — I739 Peripheral vascular disease, unspecified: Secondary | ICD-10-CM

## 2021-06-24 DIAGNOSIS — Z170 Estrogen receptor positive status [ER+]: Secondary | ICD-10-CM

## 2021-06-24 DIAGNOSIS — I82511 Chronic embolism and thrombosis of right femoral vein: Secondary | ICD-10-CM | POA: Diagnosis not present

## 2021-06-24 NOTE — Assessment & Plan Note (Signed)
ABIs today are 0.92 on the right and 0.72 on the left with digit pressures of over 100 bilaterally and biphasic waveforms.  This is stable from previous study last year.  She is now almost 4 years status post right lower extremity revascularization.  She is doing well.  Continue current medical regimen.  Recheck in 1 year ?

## 2021-06-24 NOTE — Progress Notes (Signed)
? ? ?MRN : [id number] ? ?Victoria Steele is a 81 y.o. ([birth year]) female who presents with chief complaint of  ?Chief Complaint  ?Patient presents with  ? Follow-up  ?  ultrasound  ?. ? ?History of Present Illness: Patient returns today in follow up of her PAD.  She underwent right lower extremity revascularization about 4 years ago.  She is doing well today without any specific complaints.  No disabling claudication symptoms, ischemic rest pain, or ulceration. ABIs today are 0.92 on the right and 0.72 on the left with digit pressures of over 100 bilaterally and biphasic waveforms.  This is stable from previous study last year. ? ?Current Outpatient Medications  ?Medication Sig Dispense Refill  ? acetaminophen (TYLENOL) 500 MG tablet Take 500 mg by mouth daily as needed for moderate pain or headache.     ? amLODipine (NORVASC) 10 MG tablet Take 10 mg by mouth daily.    ? apixaban (ELIQUIS) 5 MG TABS tablet Take 1 tablet (5 mg total) by mouth 2 (two) times daily. 60 tablet 6  ? atorvastatin (LIPITOR) 40 MG tablet Take 40 mg by mouth at bedtime.    ? Calcium Carb-Cholecalciferol (CALCIUM 600/VITAMIN D3 PO) Take 1 tablet by mouth daily.    ? hydrochlorothiazide (HYDRODIURIL) 12.5 MG tablet Take 12.5 mg by mouth daily.    ? ibuprofen (ADVIL,MOTRIN) 400 MG tablet Take 400 mg by mouth 3 (three) times daily as needed.     ? letrozole (FEMARA) 2.5 MG tablet Take 1 tablet (2.5 mg total) by mouth daily. 90 tablet 2  ? lidocaine-prilocaine (EMLA) cream Apply 1 application topically as needed (port access). 30 g 2  ? losartan (COZAAR) 50 MG tablet Take 1 tablet by mouth once a day  for high blood pressure- to replace 100 mg    ? palbociclib (IBRANCE) 75 MG tablet Take 1 tablet (75 mg total) by mouth daily. Take for 21 days on, 7 days off, repeat every 28 days. 21 tablet 3  ? PREVIDENT 5000 BOOSTER PLUS 1.1 % PSTE Place 1 application onto teeth 2 (two) times daily.     ? prochlorperazine (COMPAZINE) 10 MG tablet TAKE 1 TABLET BY  MOUTH EVERY 6 HOURS AS NEEDED FOR NAUSEA AND VOMITING 30 tablet 0  ? benzonatate (TESSALON) 100 MG capsule Take 1 capsule (100 mg total) by mouth 3 (three) times daily as needed for cough. (Patient not taking: Reported on 06/24/2021) 20 capsule 0  ? ?No current facility-administered medications for this visit.  ? ? ?Past Medical History:  ?Diagnosis Date  ? Breast cancer ([place])   ? Cancer [facility])   ? left breast  ? DVT (deep venous thrombosis) ([place])   ? 2019  ? Hypertension   ? Port catheter in place 09/14/2016  ? Placed 09/10/2016 RT chest.  ? ? ?Past Surgical History:  ?Procedure Laterality Date  ? ABDOMINAL HYSTERECTOMY    ? APPENDECTOMY    ? BREAST BIOPSY    ? CYST EXCISION Right 09/2016  ? Buttocks  ? IVC FILTER REMOVAL N/A 01/17/2018  ? Procedure: IVC FILTER REMOVAL;  Surgeon: Algernon Huxley, MD;  Location: [place] [facility];  Service: Cardiovascular;  Laterality: N/A;  ? LOWER EXTREMITY ANGIOGRAPHY Right 11/11/2017  ? Procedure: LOWER EXTREMITY ANGIOGRAPHY;  Surgeon: Katha Cabal, MD;  Location: [place] [facility];  Service: Cardiovascular;  Laterality: Right;  ? PERIPHERAL VASCULAR THROMBECTOMY Right 08/30/2017  ? Procedure: PERIPHERAL VASCULAR THROMBECTOMY;  Surgeon: Algernon Huxley, MD;  Location: [place] [facility];  Service: Cardiovascular;  Laterality: Right;  ? PORTACATH PLACEMENT Right 09/10/2016  ? Procedure: INSERTION PORT-A-CATH;  Surgeon: Jules Husbands, MD;  Location: [facility];  Service: General;  Laterality: Right;  ? ? ? ?Social History  ? ?Tobacco Use  ? Smoking status: Never  ? Smokeless tobacco: Never  ?Vaping Use  ? Vaping Use: Never used  ?Substance Use Topics  ? Alcohol use: No  ? Drug use: No  ? ? ? ? ?Family History  ?Problem Relation Age of Onset  ? Parkinson's disease Mother   ? Bladder Cancer Father   ? Lung cancer Father   ? ? ? ?Allergies  ?Allergen Reactions  ? Codeine Rash  ?  hallucinations  ? ? ?REVIEW OF SYSTEMS (Negative unless checked) ?  ?Constitutional: '[]'$ Weight loss   '[]'$ Fever  '[]'$ Chills ?Cardiac: '[]'$ Chest pain   '[]'$ Chest pressure   '[]'$ Palpitations   '[]'$ Shortness of breath when laying flat   '[]'$ Shortness of breath at rest   '[]'$ Shortness of breath with exertion. ?Vascular:  '[]'$ Pain in legs with walking   '[]'$ Pain in legs at rest   '[]'$ Pain in legs when laying flat   '[]'$ Claudication   '[]'$ Pain in feet when walking  '[]'$ Pain in feet at rest  '[]'$ Pain in feet when laying flat   '[x]'$ History of DVT   '[x]'$ Phlebitis   '[x]'$ Swelling in legs   '[]'$ Varicose veins   '[]'$ Non-healing ulcers ?Pulmonary:   '[]'$ Uses home oxygen   '[]'$ Productive cough   '[]'$ Hemoptysis   '[]'$ Wheeze  '[]'$ COPD   '[]'$ Asthma ?Neurologic:  '[]'$ Dizziness  '[]'$ Blackouts   '[]'$ Seizures   '[]'$ History of stroke   '[]'$ History of TIA  '[]'$ Aphasia   '[]'$ Temporary blindness   '[]'$ Dysphagia   '[]'$ Weakness or numbness in arms   '[]'$ Weakness or numbness in legs ?Musculoskeletal:  '[x]'$ Arthritis   '[]'$ Joint swelling   '[]'$ Joint pain   '[]'$ Low back pain ?Hematologic:  '[]'$ Easy bruising  '[]'$ Easy bleeding   '[]'$ Hypercoagulable state   '[]'$ Anemic  '[]'$ Hepatitis ?Gastrointestinal:  '[]'$ Blood in stool   '[]'$ Vomiting blood  '[]'$ Gastroesophageal reflux/heartburn   '[]'$ Abdominal pain ?Genitourinary:  '[]'$ Chronic kidney disease   '[]'$ Difficult urination  '[]'$ Frequent urination  '[]'$ Burning with urination   '[]'$ Hematuria ?Skin:  '[]'$ Rashes   '[]'$ Ulcers   '[]'$ Wounds ?Psychological:  '[]'$ History of anxiety   '[]'$  History of major depression. ? ?Physical Examination ? ?BP (!) 159/70 (BP Location: Right Arm)   Pulse 79   Resp 16   Ht '5\' 2"'$  (1.575 m)   Wt 200 lb 6.4 oz (90.9 kg)   BMI 36.65 kg/m?  ?Gen:  WD/WN, NAD ?Head: [place]/AT, No temporalis wasting. ?Ear/Nose/Throat: Hearing grossly intact, nares w/o erythema or drainage ?Eyes: Conjunctiva clear. Sclera non-icteric ?Neck: Supple.  Trachea midline ?Pulmonary:  Good air movement, no use of accessory muscles.  ?Cardiac: RRR, no JVD ?Vascular:  ?Vessel Right Left  ?Radial Palpable Palpable  ?    ?    ?    ?    ?    ?    ?PT 2+ palpable 1+ palpable  ?DP 1+ palpable 1+ palpable  ? ?Gastrointestinal:  soft, non-tender/non-distended. No guarding/reflex.  ?Musculoskeletal: M/S 5/5 throughout.  No deformity or atrophy.  Trace lower extremity edema. ?Neurologic: Sensation grossly intact in extremities.  Symmetrical.  Speech is fluent.  ?Psychiatric: Judgment intact, Mood & affect appropriate for pt's clinical situation. ?Dermatologic: No rashes or ulcers noted.  No cellulitis or open wounds. ? ? ? ? ? ?Labs ?Recent Results (from the past 2160 hour(s))  ?I-STAT creatinine  Status: Abnormal  ? Collection Time: 03/31/21  8:17 AM  ?Result Value Ref Range  ? Creatinine, Ser 1.10 (H) 0.44 - 1.00 mg/dL  ?CA 27.29 (SERIAL MONITOR)     Status: Abnormal  ? Collection Time: 04/09/21  9:32 AM  ?Result Value Ref Range  ? CA 27.29 71.3 (H) 0.0 - 38.6 U/mL  ?  Comment: (NOTE) ?[employer] Academic librarian [facility]) ?Values obtained with different assay methods or kits cannot be used ?interchangeably. Results cannot be interpreted as absolute evidence ?of the presence or absence of malignant disease. ?Performed At: [facility] ?[address] [place], [place] [id number] ?Rush Farmer MD WU:[id number] ?  ?Comprehensive metabolic panel     Status: Abnormal  ? Collection Time: 04/09/21  9:32 AM  ?Result Value Ref Range  ? Sodium 136 135 - 145 mmol/L  ? Potassium 3.9 3.5 - 5.1 mmol/L  ? Chloride 102 98 - 111 mmol/L  ? CO2 25 22 - 32 mmol/L  ? Glucose, Bld 148 (H) 70 - 99 mg/dL  ?  Comment: Glucose reference range applies only to samples taken after fasting for at least 8 hours.  ? BUN 15 8 - 23 mg/dL  ? Creatinine, Ser 0.81 0.44 - 1.00 mg/dL  ? Calcium 9.9 8.9 - 10.3 mg/dL  ? Total Protein 6.9 6.5 - 8.1 g/dL  ? Albumin 3.8 3.5 - 5.0 g/dL  ? AST 33 15 - 41 U/L  ? ALT 26 0 - 44 U/L  ? Alkaline Phosphatase 60 38 - 126 U/L  ? Total Bilirubin 1.0 0.3 - 1.2 mg/dL  ? GFR, Estimated >60 >60 mL/min  ?  Comment: (NOTE) ?Calculated using the CKD-EPI Creatinine Equation (2021) ?  ? Anion gap 9 5 - 15  ?  Comment:  Performed at [facility], [address], [place], [place] [address]  ?CBC with Differential     Status: Abnormal  ? Collection Time: 04/09/21  9:32 AM  ?Result Value Ref Range  ? WBC 2.6 (L) 4.0 - 10.5 K

## 2021-07-08 ENCOUNTER — Other Ambulatory Visit (HOSPITAL_COMMUNITY): Payer: Self-pay

## 2021-07-09 ENCOUNTER — Inpatient Hospital Stay: Payer: Medicare Other

## 2021-07-09 ENCOUNTER — Inpatient Hospital Stay: Payer: Medicare Other | Attending: Oncology | Admitting: Oncology

## 2021-07-09 ENCOUNTER — Encounter: Payer: Self-pay | Admitting: Oncology

## 2021-07-09 VITALS — BP 129/72 | HR 70 | Temp 98.0°F | Resp 16 | Ht 62.0 in | Wt 200.4 lb

## 2021-07-09 DIAGNOSIS — Z79811 Long term (current) use of aromatase inhibitors: Secondary | ICD-10-CM | POA: Diagnosis not present

## 2021-07-09 DIAGNOSIS — C50912 Malignant neoplasm of unspecified site of left female breast: Secondary | ICD-10-CM | POA: Diagnosis not present

## 2021-07-09 DIAGNOSIS — C7951 Secondary malignant neoplasm of bone: Secondary | ICD-10-CM | POA: Insufficient documentation

## 2021-07-09 DIAGNOSIS — Z5181 Encounter for therapeutic drug level monitoring: Secondary | ICD-10-CM | POA: Diagnosis not present

## 2021-07-09 DIAGNOSIS — D702 Other drug-induced agranulocytosis: Secondary | ICD-10-CM

## 2021-07-09 DIAGNOSIS — Z79899 Other long term (current) drug therapy: Secondary | ICD-10-CM | POA: Insufficient documentation

## 2021-07-09 DIAGNOSIS — Z7983 Long term (current) use of bisphosphonates: Secondary | ICD-10-CM

## 2021-07-09 DIAGNOSIS — Z170 Estrogen receptor positive status [ER+]: Secondary | ICD-10-CM | POA: Insufficient documentation

## 2021-07-09 LAB — CBC WITH DIFFERENTIAL/PLATELET
Abs Immature Granulocytes: 0.01 10*3/uL (ref 0.00–0.07)
Basophils Absolute: 0.1 10*3/uL (ref 0.0–0.1)
Basophils Relative: 2 %
Eosinophils Absolute: 0.1 10*3/uL (ref 0.0–0.5)
Eosinophils Relative: 3 %
HCT: 33.9 % — ABNORMAL LOW (ref 36.0–46.0)
Hemoglobin: 11.9 g/dL — ABNORMAL LOW (ref 12.0–15.0)
Immature Granulocytes: 0 %
Lymphocytes Relative: 36 %
Lymphs Abs: 0.9 10*3/uL (ref 0.7–4.0)
MCH: 37.4 pg — ABNORMAL HIGH (ref 26.0–34.0)
MCHC: 35.1 g/dL (ref 30.0–36.0)
MCV: 106.6 fL — ABNORMAL HIGH (ref 80.0–100.0)
Monocytes Absolute: 0.1 10*3/uL (ref 0.1–1.0)
Monocytes Relative: 5 %
Neutro Abs: 1.4 10*3/uL — ABNORMAL LOW (ref 1.7–7.7)
Neutrophils Relative %: 54 %
Platelets: 163 10*3/uL (ref 150–400)
RBC: 3.18 MIL/uL — ABNORMAL LOW (ref 3.87–5.11)
RDW: 14.9 % (ref 11.5–15.5)
WBC: 2.6 10*3/uL — ABNORMAL LOW (ref 4.0–10.5)
nRBC: 0 % (ref 0.0–0.2)

## 2021-07-09 LAB — COMPREHENSIVE METABOLIC PANEL
ALT: 24 U/L (ref 0–44)
AST: 33 U/L (ref 15–41)
Albumin: 3.8 g/dL (ref 3.5–5.0)
Alkaline Phosphatase: 61 U/L (ref 38–126)
Anion gap: 8 (ref 5–15)
BUN: 15 mg/dL (ref 8–23)
CO2: 24 mmol/L (ref 22–32)
Calcium: 9.5 mg/dL (ref 8.9–10.3)
Chloride: 104 mmol/L (ref 98–111)
Creatinine, Ser: 1.08 mg/dL — ABNORMAL HIGH (ref 0.44–1.00)
GFR, Estimated: 53 mL/min — ABNORMAL LOW (ref >60)
Glucose, Bld: 157 mg/dL — ABNORMAL HIGH (ref 70–99)
Potassium: 3.5 mmol/L (ref 3.5–5.1)
Sodium: 136 mmol/L (ref 135–145)
Total Bilirubin: 1.1 mg/dL (ref 0.3–1.2)
Total Protein: 7 g/dL (ref 6.5–8.1)

## 2021-07-09 MED ORDER — LETROZOLE 2.5 MG PO TABS: 2.5000 mg | ORAL_TABLET | Freq: Every day | ORAL | 2 refills | Status: DC

## 2021-07-09 MED ORDER — SODIUM CHLORIDE 0.9 % IV SOLN
INTRAVENOUS | Status: DC
  Filled 2021-07-09: qty 250

## 2021-07-09 MED ORDER — ZOLEDRONIC ACID 4 MG/100ML IV SOLN
4.0000 mg | Freq: Once | INTRAVENOUS | Status: AC
  Administered 2021-07-09: 4 mg via INTRAVENOUS
  Filled 2021-07-09: qty 100

## 2021-07-09 MED ORDER — HEPARIN SOD (PORK) LOCK FLUSH 100 UNIT/ML IV SOLN
500.0000 [IU] | Freq: Once | INTRAVENOUS | Status: AC
  Administered 2021-07-09: 500 [IU] via INTRAVENOUS
  Filled 2021-07-09: qty 5

## 2021-07-09 MED ORDER — LIDOCAINE-PRILOCAINE 2.5-2.5 % EX CREA: 1.0000 "application " | TOPICAL_CREAM | CUTANEOUS | 2 refills | Status: AC | PRN

## 2021-07-09 NOTE — Progress Notes (Signed)
Pt states that once in a while she feels a pain at the heart area and she takes deep breathing and in about 1 hour it goes away. ?She is having left shoulder pain ?

## 2021-07-09 NOTE — Patient Instructions (Signed)

## 2021-07-09 NOTE — Progress Notes (Signed)
? ? ? ?Hematology/Oncology Consult note ?[place]  ?[phone] [phone] ? ?Patient Care Team: ?Letta Median, MD as PCP - General (Family Medicine) ?Sindy Guadeloupe, MD as Consulting Physician (Oncology)  ? ?Name of the patient: Victoria Steele  ?[id number]  ?September 15, 1940  ? ?Date of visit: 07/09/21 ? 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 ?Chief complaint/ Reason for visit-routine follow-up of breast cancer on letrozole and Ibrance and to receive Zometa ? 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  Patient has been on Ibrance and letrozole since then.  She had cytopenias with [facility] and is taking 75 mg 3 weeks on 1 week off which was started in September 2018.  Patient is on Zometa for bone metastases which she is getting every 3 months. Patient had extensive RLE DVT in April 2019 and is currently on eliquis. She also underwent venous thrombectomy and thrombolytic therapy and IVC placement by Dr. Lucky Cowboy.  She also has peripheral vascular disease secondary to atherosclerosis and underwent stenting with vascular surgery as well ?  ? ?Interval history-  ? ?ECOG PS- 1 ?Pain scale- 0 ? ?Review of systems- Review of Systems  ?Constitutional:  Negative for chills, fever, malaise/fatigue and weight loss.  ?HENT:  Negative for congestion, ear discharge and nosebleeds.   ?Eyes:  Negative for blurred vision.  ?Respiratory:  Negative for cough, hemoptysis, sputum production, shortness of breath and wheezing.   ?Cardiovascular:  Negative for chest pain, palpitations, orthopnea and claudication.  ?Gastrointestinal:  Negative for abdominal pain, blood in stool, constipation, diarrhea, heartburn, melena, nausea and vomiting.  ?Genitourinary:  Negative for dysuria, flank pain, frequency, hematuria and urgency.   ?Musculoskeletal:  Negative for back pain, joint pain and myalgias.  ?Skin:  Negative for rash.  ?Neurological:  Negative for dizziness, tingling, focal weakness, seizures, weakness and headaches.  ?Endo/Heme/Allergies:  Does not bruise/bleed easily.  ?Psychiatric/Behavioral:  Negative for depression and suicidal ideas. The patient does not have insomnia.    ? ? ?Allergies  ?Allergen Reactions  ? Codeine Rash  ?  hallucinations  ? ? ? ?Past Medical History:  ?Diagnosis Date  ? Breast cancer ([place])   ? Cancer [facility])   ? left breast  ? DVT (deep venous thrombosis) ([place])   ? 2019  ? Hypertension   ? Port catheter in place 09/14/2016  ? Placed 09/10/2016 RT chest.  ? ? ? ?Past Surgical History:  ?Procedure Laterality Date  ? ABDOMINAL HYSTERECTOMY    ? APPENDECTOMY    ? BREAST BIOPSY    ? CYST EXCISION Right 09/2016  ? Buttocks  ? IVC FILTER REMOVAL N/A 01/17/2018  ? Procedure: IVC FILTER REMOVAL;  Surgeon: Algernon Huxley, MD;  Location: [place];  Service: Cardiovascular;  Laterality: N/A;  ? LOWER EXTREMITY ANGIOGRAPHY Right 11/11/2017  ? Procedure: LOWER EXTREMITY ANGIOGRAPHY;  Surgeon: Katha Cabal, MD;  Location: [facility];  Service: Cardiovascular;  Laterality: Right;  ? PERIPHERAL VASCULAR THROMBECTOMY Right 08/30/2017  ? Procedure: PERIPHERAL VASCULAR THROMBECTOMY;  Surgeon: Algernon Huxley, MD;  Location: [place];  Service: Cardiovascular;  Laterality: Right;  ? PORTACATH PLACEMENT Right 09/10/2016  ? Procedure: INSERTION PORT-A-CATH;  Surgeon: Jules Husbands, MD;  Location: [facility];  Service: General;  Laterality: Right;  ? ? ?Social History  ? ?Socioeconomic History  ? Marital status:  Married  ?  Spouse name: Jeneen Rinks  ? Number of children: 2  ? Years of education: 6  ? Highest education level: Bachelor's degree (e.g., BA, AB, BS)  ?Occupational History  ? Occupation: RETIRED  ?  Comment: ACCOUNTANT AT [employer]  ?Tobacco Use  ? Smoking status: Never  ? Smokeless tobacco:  Never  ?Vaping Use  ? Vaping Use: Never used  ?Substance and Sexual Activity  ? Alcohol use: No  ? Drug use: No  ? Sexual activity: Never  ?Other Topics Concern  ? Not on file  ?Social History Narrative  ? Not on file  ? ?Social Determinants of Health  ? ?Financial Resource Strain: Not on file  ?Food Insecurity: Not on file  ?Transportation Needs: Not on file  ?Physical Activity: Not on file  ?Stress: Not on file  ?Social Connections: Not on file  ?Intimate Partner Violence: Not on file  ? ? ?Family History  ?Problem Relation Age of Onset  ? Parkinson's disease Mother   ? Bladder Cancer Father   ? Lung cancer Father   ? ? ? ?Current Outpatient Medications:  ?  acetaminophen (TYLENOL) 500 MG tablet, Take 500 mg by mouth daily as needed for moderate pain or headache. , Disp: , Rfl:  ?  amLODipine (NORVASC) 10 MG tablet, Take 10 mg by mouth daily., Disp: , Rfl:  ?  apixaban (ELIQUIS) 5 MG TABS tablet, Take 1 tablet (5 mg total) by mouth 2 (two) times daily., Disp: 60 tablet, Rfl: 6 ?  atorvastatin (LIPITOR) 40 MG tablet, Take 40 mg by mouth at bedtime., Disp: , Rfl:  ?  Calcium Carb-Cholecalciferol (CALCIUM 600/VITAMIN D3 PO), Take 1 tablet by mouth daily., Disp: , Rfl:  ?  hydrochlorothiazide (HYDRODIURIL) 12.5 MG tablet, Take 12.5 mg by mouth daily., Disp: , Rfl:  ?  ibuprofen (ADVIL,MOTRIN) 400 MG tablet, Take 400 mg by mouth 3 (three) times daily as needed. , Disp: , Rfl:  ?  losartan (COZAAR) 50 MG tablet, Take 1 tablet by mouth once a day  for high blood pressure- to replace 100 mg, Disp: , Rfl:  ?  palbociclib (IBRANCE) 75 MG tablet, Take 1 tablet (75 mg total) by mouth daily. Take for 21 days on, 7 days off, repeat every 28 days., Disp: 21 tablet, Rfl: 3 ?  PREVIDENT 5000 BOOSTER PLUS 1.1 % PSTE, Place 1 application onto teeth 2 (two) times daily. , Disp: , Rfl:  ?  letrozole (FEMARA) 2.5 MG tablet, Take 1 tablet (2.5 mg total) by mouth daily., Disp: 90 tablet, Rfl: 2 ?  lidocaine-prilocaine (EMLA) cream, Apply  1 application. topically as needed (port access)., Disp: 30 g, Rfl: 2 ?  prochlorperazine (COMPAZINE) 10 MG tablet, TAKE 1 TABLET BY MOUTH EVERY 6 HOURS AS NEEDED FOR NAUSEA AND VOMITING (Patient not taking: Reported on 07/09/2021), Disp: 30 tablet, Rfl: 0 ?No current facility-administered medications for this visit. ? ?Facility-Administered Medications Ordered in Other Visits:  ?  0.9 %  sodium chloride infusion, , Intravenous, Continuous, Sindy Guadeloupe, MD, Stopped at 07/09/21 1508 ? ?Physical exam:  ?Vitals:  ? 07/09/21 1421  ?BP: 129/72  ?Pulse: 70  ?Resp: 16  ?Temp: 98 ?F (36.7 ?C)  ?TempSrc: Tympanic  ?Weight: 200 lb 6.4 oz (90.9 kg)  ?Height: _0  (1.575 m)  ? ?Physical Exam ?Constitutional:   ?   General: She is not in acute distress. ?Cardiovascular:  ?   Rate and Rhythm: Normal rate and regular rhythm.  ?  Heart sounds: Normal heart sounds.  ?Pulmonary:  ?   Effort: Pulmonary effort is normal.  ?   Breath sounds: Normal breath sounds.  ?Abdominal:  ?   General: Bowel sounds are normal.  ?   Palpations: Abdomen is soft.  ?Skin: ?   General: Skin is warm and dry.  ?Neurological:  ?   Mental Status: She is alert and oriented to person, place, and time.  ?  ? ? ?  Latest Ref Rng & Units 07/09/2021  ?  1:39 PM  ?CMP  ?Glucose 70 - 99 mg/dL 157    ?BUN 8 - 23 mg/dL 15    ?Creatinine 0.44 - 1.00 mg/dL 1.08    ?Sodium 135 - 145 mmol/L 136    ?Potassium 3.5 - 5.1 mmol/L 3.5    ?Chloride 98 - 111 mmol/L 104    ?CO2 22 - 32 mmol/L 24    ?Calcium 8.9 - 10.3 mg/dL 9.5    ?Total Protein 6.5 - 8.1 g/dL 7.0    ?Total Bilirubin 0.3 - 1.2 mg/dL 1.1    ?Alkaline Phos 38 - 126 U/L 61    ?AST 15 - 41 U/L 33    ?ALT 0 - 44 U/L 24    ? ? ?  Latest Ref Rng & Units 07/09/2021  ?  1:39 PM  ?CBC  ?WBC 4.0 - 10.5 K/uL 2.6    ?Hemoglobin 12.0 - 15.0 g/dL 11.9    ?Hematocrit 36.0 - 46.0 % 33.9    ?Platelets 150 - 400 K/uL 163    ? ? ?No images are attached to the encounter. ? ?VAS [place] ABI WITH/WO TBI ? ?Result Date: 06/30/2021 ? LOWER  EXTREMITY DOPPLER STUDY Patient Name:  LEANDRIA THIER  Date of Exam:   06/24/2021 Medical Rec #: [id number]       Accession #:    [id number] Date of Birth: 01-Mar-1941       Patient Gender: F Patient Age:   11 years Exam

## 2021-07-10 LAB — CANCER ANTIGEN 27.29: CA 27.29: 79.7 U/mL — ABNORMAL HIGH (ref 0.0–38.6)

## 2021-07-13 ENCOUNTER — Encounter: Payer: Self-pay | Admitting: Oncology

## 2021-07-14 ENCOUNTER — Telehealth: Payer: Self-pay | Admitting: *Deleted

## 2021-07-14 NOTE — Telephone Encounter (Signed)
Pt called and said that the she called into [facility] and she wanted refill but the machine says there are no refills. I called pharmacy because I sent in new refill 3/6 and gave 6 refills. I spoke to pharmacy staff and they explained that if it is the first time getting refill after completing rx the auto machine for pts' is telling pt no refills and this is telling pt that your old rx is used up but does not tell them that the pt has new rx refill. People in pharmacy said  if pt asks they can show her how to look at bottle to know when last rx is ending and the next new is starting. I called pt and made her aware.she can pick up her eliquis today ?

## 2021-07-15 ENCOUNTER — Other Ambulatory Visit (HOSPITAL_COMMUNITY): Payer: Self-pay

## 2021-07-16 ENCOUNTER — Other Ambulatory Visit (HOSPITAL_COMMUNITY): Payer: Self-pay

## 2021-08-04 ENCOUNTER — Other Ambulatory Visit (HOSPITAL_COMMUNITY): Payer: Self-pay

## 2021-08-04 ENCOUNTER — Other Ambulatory Visit: Payer: Self-pay | Admitting: Oncology

## 2021-08-04 DIAGNOSIS — C50912 Malignant neoplasm of unspecified site of left female breast: Secondary | ICD-10-CM

## 2021-08-04 MED ORDER — PALBOCICLIB 75 MG PO TABS
75.0000 mg | ORAL_TABLET | Freq: Every day | ORAL | 3 refills | Status: DC
  Filled 2021-08-11: qty 21, 28d supply, fill #0
  Filled 2021-08-28: qty 21, 28d supply, fill #1
  Filled 2021-09-26: qty 21, 28d supply, fill #2
  Filled 2021-10-22: qty 21, 28d supply, fill #3

## 2021-08-04 NOTE — Telephone Encounter (Signed)
CBC with Differential ?Order: [id number] ?Status: Final result    ?Visible to patient: No (inaccessible in MyChart)    ?Next appt: 10/06/2021 at 09:00 AM in Radiology ([facility])    ?Dx: Primary cancer of left breast with me...    ?0 Result Notes ?          ?Component Ref Range & Units 3 wk ago ?(07/09/21) 3 mo ago ?(04/09/21) 7 mo ago ?(12/24/20) 10 mo ago ?(09/23/20) 1 yr ago ?(06/11/20) 1 yr ago ?(03/12/20) 1 yr ago ?(12/11/19)  ?WBC 4.0 - 10.5 K/uL 2.6 Low   2.6 Low   2.5 Low   2.6 Low   2.9 Low   2.3 Low   2.4 Low    ?RBC 3.87 - 5.11 MIL/uL 3.18 Low   3.22 Low   3.13 Low   3.06 Low   3.26 Low   3.45 Low   3.67 Low    ?Hemoglobin 12.0 - 15.0 g/dL 11.9 Low   12.1  12.0  12.1  12.4  13.1  13.6   ?HCT 36.0 - 46.0 % 33.9 Low   34.4 Low   34.0 Low   33.5 Low   34.4 Low   36.7  37.5   ?MCV 80.0 - 100.0 fL 106.6 High   106.8 High   108.6 High   109.5 High   105.5 High   106.4 High   102.2 High    ?MCH 26.0 - 34.0 pg 37.4 High   37.6 High   38.3 High   39.5 High   38.0 High   38.0 High   37.1 High    ?MCHC 30.0 - 36.0 g/dL 35.1  35.2  35.3  36.1 High   36.0  35.7  36.3 High    ?RDW 11.5 - 15.5 % 14.9  14.5  14.4  14.6  14.2  14.4  13.9   ?Platelets 150 - 400 K/uL 163  224  145 Low   232  162  196  326   ?nRBC 0.0 - 0.2 % 0.0  0.0  0.0  0.0  0.0  0.0  0.0   ?Neutrophils Relative % % 54  60  52  51  56  48  50   ?Neutro Abs 1.7 - 7.7 K/uL 1.4 Low   1.5 Low   1.3 Low   1.3 Low   1.6 Low   1.1 Low   1.2 Low    ?Lymphocytes Relative % 36  28  39  39  33  40  38   ?Lymphs Abs 0.7 - 4.0 K/uL 0.9  0.7  1.0  1.0  1.0  0.9  0.9   ?Monocytes Relative % '5  7  5  6  7  7  5   '$ ?Monocytes Absolute 0.1 - 1.0 K/uL 0.1  0.2  0.1  0.1  0.2  0.2  0.1   ?Eosinophils Relative % '3  3  2  2  2  3  3   '$ ?Eosinophils Absolute 0.0 - 0.5 K/uL 0.1  0.1  0.0  0.1  0.1  0.1  0.1   ?Basophils Relative % '2  2  2  2  2  2  3   '$ ?Basophils Absolute 0.0 - 0.1 K/uL 0.1   0.1  0.1  0.1  0.0  0.1   ?Immature Granulocytes % 0  1 CM  0  0  0  0  1   ?Abs Immature  Granulocytes 0.00 -  0.07 K/uL 0.01   0.01 CM  0.01 CM  0.01 CM  0.01 CM  0.02   ?Comment: Performed at [facility], [address] [place], [place] [address]  ?WBC Morphology   DIFF CONFIRMED BY MANUAL  DIFF. CONFIRMED BY SMEAR  DIFF. CONFIRMED BY SMEAR   DIFF. CONFIRMED BY SMEAR    ?RBC Morphology   UNREMRAKABLE  MORPHOLOGY UNREMARKABLE  MACROCYTES PRESENT   MORPHOLOGY UNREMARKABLE    ?Smear Review   PLATELETS APPEAR ADEQUATE CM  Normal platelet morphology CM  Normal platelet morphology CM   Normal platelet morphology CM  Normal platelet morphology CM   ?Ovalocytes        PRESENT CM   ?Resulting Agency  [place] [facility] [place] [facility] [place] [facility] [place] [facility] [place] [facility] [place] [facility] [facility]  ?  ? ?  ?  ?Specimen Collected: 07/09/21 13:39 Last Resulted: 07/09/21 14:10  ?  ?  Lab Flowsheet   ? Order Details   ? View Encounter   ? Lab and Collection Details   ? Routing   ? Result History    ?View Encounter Conversation    ?  ?CM=Additional comments    ?  ?Result Care Coordination ? ? ?Patient Communication ? ? Add Comments   Not seen Back to Top  ?  ?  ? ?Other Results from 07/09/2021 ? ? Contains abnormal data Cancer antigen 27.29 ?Order: [id number] ?Status: Final result    ?Visible to patient: No (inaccessible in MyChart)    ?Next appt: 10/06/2021 at 09:00 AM in Radiology ([facility])    ?Dx: Primary cancer of left breast with me...    ?0 Result Notes ?          ?Component Ref Range & Units 3 wk ago ?(07/09/21) 3 mo ago ?(04/09/21) 7 mo ago ?(12/24/20) 10 mo ago ?(09/23/20) 1 yr ago ?(06/11/20) 1 yr ago ?(03/12/20) 1 yr ago ?(12/11/19)  ?CA 27.29 0.0 - 38.6 U/mL 79.7 High   71.3 High  CM  67.7 High  CM  80.3 High  CM  63.6 High  CM  61.2 High  CM  74.7 High  CM   ?Comment: (NOTE)  ?[employer] librarian [facility])  ?Values obtained with different assay methods or kits cannot be used  ?interchangeably. Results cannot be interpreted as absolute evidence  ?of the presence or absence of  malignant disease.  ?Performed At: [place]  ?[address] [place], [place] [id number]  ?Rush Farmer MD [id number]   ?Resulting Agency  [place] [facility] [place] [facility] [place] [facility] [place] [facility] [place] [facility] [place] [facility] [place] [facility]  ?  ? ?  ?  ?Specimen Collected: 07/09/21 13:39 Last Resulted: 07/10/21 02:35  ?  ?  Lab Flowsheet   ? Order Details   ? View Encounter   ? Lab and Collection Details   ? Routing   ? Result History    ?View Encounter Conversation    ?  ?CM=Additional comments    ?  ?Result Care Coordination ? ? ?Patient Communication ? ? Add Comments   Not seen Back to Top  ?  ?  ? ?  ? Contains abnormal data Comprehensive metabolic panel ?Order: [id number] ?Status: Final result    ?Visible to patient: No (inaccessible in MyChart)    ?Next appt: 10/06/2021 at 09:00 AM in Radiology ([facility])    ?Dx: Primary cancer of left  breast with me...    ?0 Result Notes ?          ?Component Ref Range & Units 3 wk ago ?(07/09/21) 3 mo ago ?(04/09/21) 4 mo ago ?(03/31/21) 7 mo ago ?(12/24/20) 10 mo ago ?(09/23/20) 10 mo ago ?(09/17/20) 1 yr ago ?(06/11/20)  ?Sodium 135 - 145 mmol/L 136  136   135  136   136   ?Potassium 3.5 - 5.1 mmol/L 3.5  3.9   3.6  3.5   3.6   ?Chloride 98 - 111 mmol/L 104  102   103  99   99   ?CO2 22 - 32 mmol/L '24  25   23  27   25   '$ ?Glucose, Bld 70 - 99 mg/dL 157 High   148 High  CM   156 High  CM  189 High  CM   177 High  CM   ?Comment: Glucose reference range applies only to samples taken after fasting for at least 8 hours.  ?BUN 8 - 23 mg/dL '15  15   14  15   15   '$ ?Creatinine, Ser 0.44 - 1.00 mg/dL 1.08 High   0.81  1.10 High   1.05 High   1.13 High   1.20 High   1.13 High    ?Calcium 8.9 - 10.3 mg/dL 9.5  9.9   9.5  9.9   9.8   ?Total Protein 6.5 - 8.1 g/dL 7.0  6.9   7.2  7.1   7.3   ?Albumin 3.5 - 5.0 g/dL 3.8  3.8   4.0  4.0   4.0   ?AST 15 - 41 U/L 33  33   41  38   30   ?ALT 0 - 44 U/L 24  26   32  33   26   ?Alkaline Phosphatase 38 - 126 U/L 61  60   60  57   62   ?Total  Bilirubin 0.3 - 1.2 mg/dL 1.1  1.0   1.5 High   1.1   1.0   ?GFR, Estimated >60 mL/min 53 Low   >60 CM   55 Low  CM  50 Low  CM   50 Low  CM   ?Comment: (NOTE)  ?Calculated using the CKD-EPI Creatinine Equation (2021)   ?Anion gap 5 - '15 8  9 '$ CM   9 CM  10 CM   12 CM   ?Comment: Performed at [facility], [address], [place], [place] [address]  ?Resulting Agency  [place] [facility] [place] [facility] [place] [facility] [place] [facility] [place] [facility] [place] [facility]  ?  ? ?  ?  ?Specimen Collected: 07/09/21 13:39 Last Resulted: 07/09/21 14:14  ?  ?  ? ?

## 2021-08-05 ENCOUNTER — Other Ambulatory Visit (HOSPITAL_COMMUNITY): Payer: Self-pay

## 2021-08-11 ENCOUNTER — Other Ambulatory Visit (HOSPITAL_COMMUNITY): Payer: Self-pay

## 2021-08-12 ENCOUNTER — Other Ambulatory Visit (HOSPITAL_COMMUNITY): Payer: Self-pay

## 2021-08-15 ENCOUNTER — Other Ambulatory Visit (HOSPITAL_COMMUNITY): Payer: Self-pay

## 2021-08-28 ENCOUNTER — Encounter: Payer: Self-pay | Admitting: Cardiology

## 2021-08-28 ENCOUNTER — Other Ambulatory Visit
Admission: RE | Admit: 2021-08-28 | Discharge: 2021-08-28 | Disposition: A | Discharge status: Home / Self Care | Payer: Medicare Other | Attending: Cardiology | Admitting: Cardiology

## 2021-08-28 ENCOUNTER — Other Ambulatory Visit (HOSPITAL_COMMUNITY): Payer: Self-pay

## 2021-08-28 ENCOUNTER — Ambulatory Visit: Payer: Medicare Other | Admitting: Cardiology

## 2021-08-28 VITALS — BP 130/60 | HR 71 | Ht 62.0 in | Wt 198.2 lb

## 2021-08-28 DIAGNOSIS — R072 Precordial pain: Secondary | ICD-10-CM | POA: Insufficient documentation

## 2021-08-28 DIAGNOSIS — R0609 Other forms of dyspnea: Secondary | ICD-10-CM | POA: Insufficient documentation

## 2021-08-28 DIAGNOSIS — Z01812 Encounter for preprocedural laboratory examination: Secondary | ICD-10-CM

## 2021-08-28 DIAGNOSIS — I10 Essential (primary) hypertension: Secondary | ICD-10-CM | POA: Diagnosis not present

## 2021-08-28 DIAGNOSIS — I739 Peripheral vascular disease, unspecified: Secondary | ICD-10-CM | POA: Diagnosis not present

## 2021-08-28 DIAGNOSIS — I209 Angina pectoris, unspecified: Secondary | ICD-10-CM | POA: Insufficient documentation

## 2021-08-28 LAB — BASIC METABOLIC PANEL
Anion gap: 15 (ref 5–15)
BUN: 15 mg/dL (ref 8–23)
CO2: 22 mmol/L (ref 22–32)
Calcium: 10.6 mg/dL — ABNORMAL HIGH (ref 8.9–10.3)
Chloride: 103 mmol/L (ref 98–111)
Creatinine, Ser: 1.18 mg/dL — ABNORMAL HIGH (ref 0.44–1.00)
GFR, Estimated: 47 mL/min — ABNORMAL LOW (ref >60)
Glucose, Bld: 135 mg/dL — ABNORMAL HIGH (ref 70–99)
Potassium: 3.6 mmol/L (ref 3.5–5.1)
Sodium: 140 mmol/L (ref 135–145)

## 2021-08-28 MED ORDER — METOPROLOL TARTRATE 100 MG PO TABS: ORAL_TABLET | ORAL | 0 refills | Status: DC

## 2021-08-28 NOTE — Patient Instructions (Addendum)
Medication Instructions:  - Your physician recommends that you continue on your current medications as directed. Please refer to the Current Medication list given to you today.  *If you need a refill on your cardiac medications before your next appointment, please call your pharmacy*   Lab Work: - Your physician recommends that you return for lab work prior to your Cardiac CT:   BMP  (You may have this done now/ or up until 2 days prior to your Cardiac CT)  -[facility] Mall Entrance at [facility] 1st desk on the right to check in (REGISTRATION) Lab hours: Monday- Friday (7:30 am- 5:30 pm)   If you have labs (blood work) drawn today and your tests are completely normal, you will receive your results only by: [place] (if you have MyChart) OR A paper copy in the mail If you have any lab test that is abnormal or we need to change your treatment, we will call you to review the results.   Testing/Procedures:  1) Echocardiogram: - Your physician has requested that you have an echocardiogram. Echocardiography is a painless test that uses sound waves to create images of your heart. It provides your doctor with information about the size and shape of your heart and how well your heart's chambers and valves are working. This procedure takes approximately one hour. There are no restrictions for this procedure. There is a possibility that an IV may need to be started during your test to inject an image enhancing agent. This is done to obtain more optimal pictures of your heart. Therefore we ask that you do at least drink some water prior to coming in to hydrate your veins.    2) Cardiac CT: - Your physician has requested that you have cardiac CT. Cardiac computed tomography (CT) is a painless test that uses an x-ray machine to take clear, detailed pictures of your heart.    Your cardiac CT will be scheduled at  [facility] [address] [place],  [address] [phone]  Please arrive 15 mins early for check-in and test prep.  Please follow these instructions carefully (unless otherwise directed):   On the Night Before the Test: Be sure to Drink plenty of water. Do not consume any caffeinated/decaffeinated beverages or chocolate 12 hours prior to your test. Do not take any antihistamines 12 hours prior to your test.   On the Day of the Test: Drink plenty of water until 1 hour prior to the test. Do not eat any food 4 hours prior to the test. You may take your regular medications prior to the test unless stated below.  Take metoprolol (Lopressor) 100 mg two hours prior to test x 1 dose. HOLD Hydrochlorothiazide morning of the test. FEMALES- please wear underwire-free bra if available, avoid dresses & tight clothing       After the Test: Drink plenty of water. After receiving IV contrast, you may experience a mild flushed feeling. This is normal. On occasion, you may experience a mild rash up to 24 hours after the test. This is not dangerous. If this occurs, you can take Benadryl 25 mg and increase your fluid intake. If you experience trouble breathing, this can be serious. If it is severe call 911 IMMEDIATELY. If it is mild, please call our office.   We will call to schedule your test understanding that some insurance companies will need an authorization prior to the service being performed.   For non-scheduling related questions, please  contact the cardiac imaging nurse navigator should you have any questions/concerns: Marchia Bond, Cardiac Imaging Nurse Navigator Gordy Clement, Cardiac Imaging Nurse Navigator [facility] Direct Office Dial: [phone]   For scheduling needs, including cancellations and rescheduling, please call [place], [phone].    Follow-Up: At [facility] you and your health needs are our priority.  As part of our continuing mission to provide  you with exceptional heart care, we have created designated Provider Care Teams.  These Care Teams include your primary Cardiologist (physician) and Advanced Practice Providers (APPs -  Physician Assistants and Nurse Practitioners) who all work together to provide you with the care you need, when you need it.  We recommend signing up for the patient portal called "MyChart".  Sign up information is provided on this After Visit Summary.  MyChart is used to connect with patients for Virtual Visits (Telemedicine).  Patients are able to view lab/test results, encounter notes, upcoming appointments, etc.  Non-urgent messages can be sent to your provider as well.   To learn more about what you can do with MyChart, go to [email or web address].    Your next appointment:   6 week(s)  The format for your next appointment:   In Person  Provider:   Glenetta Hew, MD    Other Instructions  Echocardiogram An echocardiogram is a test that uses sound waves (ultrasound) to produce images of the heart. Images from an echocardiogram can provide important information about: Heart size and shape. The size and thickness and movement of your heart's walls. Heart muscle function and strength. Heart valve function or if you have stenosis. Stenosis is when the heart valves are too narrow. If blood is flowing backward through the heart valves (regurgitation). A tumor or infectious growth around the heart valves. Areas of heart muscle that are not working well because of poor blood flow or injury from a heart attack. Aneurysm detection. An aneurysm is a weak or damaged part of an artery wall. The wall bulges out from the normal force of blood pumping through the body. Tell a health care provider about: Any allergies you have. All medicines you are taking, including vitamins, herbs, eye drops, creams, and over-the-counter medicines. Any blood disorders you have. Any surgeries you have had. Any medical  conditions you have. Whether you are pregnant or may be pregnant. What are the risks? Generally, this is a safe test. However, problems may occur, including an allergic reaction to dye (contrast) that may be used during the test. What happens before the test? No specific preparation is needed. You may eat and drink normally. What happens during the test?  You will take off your clothes from the waist up and put on a hospital gown. Electrodes or electrocardiogram (ECG)patches may be placed on your chest. The electrodes or patches are then connected to a device that monitors your heart rate and rhythm. You will lie down on a table for an ultrasound exam. A gel will be applied to your chest to help sound waves pass through your skin. A handheld device, called a transducer, will be pressed against your chest and moved over your heart. The transducer produces sound waves that travel to your heart and bounce back (or "echo" back) to the transducer. These sound waves will be captured in real-time and changed into images of your heart that can be viewed on a video monitor. The images will be recorded on a computer and reviewed by your health care provider. You may  be asked to change positions or hold your breath for a short time. This makes it easier to get different views or better views of your heart. In some cases, you may receive contrast through an IV in one of your veins. This can improve the quality of the pictures from your heart. The procedure may vary among health care providers and hospitals. What can I expect after the test? You may return to your normal, everyday life, including diet, activities, and medicines, unless your health care provider tells you not to do that. Follow these instructions at home: It is up to you to get the results of your test. Ask your health care provider, or the department that is doing the test, when your results will be ready. Keep all follow-up visits. This is  important. Summary An echocardiogram is a test that uses sound waves (ultrasound) to produce images of the heart. Images from an echocardiogram can provide important information about the size and shape of your heart, heart muscle function, heart valve function, and other possible heart problems. You do not need to do anything to prepare before this test. You may eat and drink normally. After the echocardiogram is completed, you may return to your normal, everyday life, unless your health care provider tells you not to do that. This information is not intended to replace advice given to you by your health care provider. Make sure you discuss any questions you have with your health care provider. Document Revised: 12/11/2020 Document Reviewed: 11/21/2019 Elsevier Patient Education  [place].    Cardiac CT Angiogram A cardiac CT angiogram is a procedure to look at the heart and the area around the heart. It may be done to help find the cause of chest pains or other symptoms of heart disease. During this procedure, a substance called contrast dye is injected into the blood vessels in the area to be checked. A large X-ray machine, called a CT scanner, then takes detailed pictures of the heart and the surrounding area. The procedure is also sometimes called a coronary CT angiogram, coronary artery scanning, or CTA. A cardiac CT angiogram allows the health care provider to see how well blood is flowing to and from the heart. The health care provider will be able to see if there are any problems, such as: Blockage or narrowing of the coronary arteries in the heart. Fluid around the heart. Signs of weakness or disease in the muscles, valves, and tissues of the heart. Tell a health care provider about: Any allergies you have. This is especially important if you have had a previous allergic reaction to contrast dye. All medicines you are taking, including vitamins, herbs, eye drops, creams, and  over-the-counter medicines. Any blood disorders you have. Any surgeries you have had. Any medical conditions you have. Whether you are pregnant or may be pregnant. Any anxiety disorders, chronic pain, or other conditions you have that may increase your stress or prevent you from lying still. What are the risks? Generally, this is a safe procedure. However, problems may occur, including: Bleeding. Infection. Allergic reactions to medicines or dyes. Damage to other structures or organs. Kidney damage from the contrast dye that is used. Increased risk of cancer from radiation exposure. This risk is low. Talk with your health care provider about: The risks and benefits of testing. How you can receive the lowest dose of radiation. What happens before the procedure? Wear comfortable clothing and remove any jewelry, glasses, dentures, and hearing aids. Follow  instructions from your health care provider about eating and drinking. This may include: For 12 hours before the procedure -- avoid caffeine. This includes tea, coffee, soda, energy drinks, and diet pills. Drink plenty of water or other fluids that do not have caffeine in them. Being well hydrated can prevent complications. For 4-6 hours before the procedure -- stop eating and drinking. The contrast dye can cause nausea, but this is less likely if your stomach is empty. Ask your health care provider about changing or stopping your regular medicines. This is especially important if you are taking diabetes medicines, blood thinners, or medicines to treat problems with erections (erectile dysfunction). What happens during the procedure?  Hair on your chest may need to be removed so that small sticky patches called electrodes can be placed on your chest. These will transmit information that helps to monitor your heart during the procedure. An IV will be inserted into one of your veins. You might be given a medicine to control your heart rate  during the procedure. This will help to ensure that good images are obtained. You will be asked to lie on an exam table. This table will slide in and out of the CT machine during the procedure. Contrast dye will be injected into the IV. You might feel warm, or you may get a metallic taste in your mouth. You will be given a medicine called nitroglycerin. This will relax or dilate the arteries in your heart. The table that you are lying on will move into the CT machine tunnel for the scan. The person running the machine will give you instructions while the scans are being done. You may be asked to: Keep your arms above your head. Hold your breath. Stay very still, even if the table is moving. When the scanning is complete, you will be moved out of the machine. The IV will be removed. The procedure may vary among health care providers and hospitals. What can I expect after the procedure? After your procedure, it is common to have: A metallic taste in your mouth from the contrast dye. A feeling of warmth. A headache from the nitroglycerin. Follow these instructions at home: Take over-the-counter and prescription medicines only as told by your health care provider. If you are told, drink enough fluid to keep your urine pale yellow. This will help to flush the contrast dye out of your body. Most people can return to their normal activities right after the procedure. Ask your health care provider what activities are safe for you. It is up to you to get the results of your procedure. Ask your health care provider, or the department that is doing the procedure, when your results will be ready. Keep all follow-up visits as told by your health care provider. This is important. Contact a health care provider if: You have any symptoms of allergy to the contrast dye. These include: Shortness of breath. Rash or hives. A racing heartbeat. Summary A cardiac CT angiogram is a procedure to look at the  heart and the area around the heart. It may be done to help find the cause of chest pains or other symptoms of heart disease. During this procedure, a large X-ray machine, called a CT scanner, takes detailed pictures of the heart and the surrounding area after a contrast dye has been injected into blood vessels in the area. Ask your health care provider about changing or stopping your regular medicines before the procedure. This is especially important if you  are taking diabetes medicines, blood thinners, or medicines to treat erectile dysfunction. If you are told, drink enough fluid to keep your urine pale yellow. This will help to flush the contrast dye out of your body. This information is not intended to replace advice given to you by your health care provider. Make sure you discuss any questions you have with your health care provider. Document Revised: 12/11/2020 Document Reviewed: 11/23/2018 Elsevier Patient Education  [place]

## 2021-08-28 NOTE — Progress Notes (Signed)
Primary Care Provider: Oswaldo Conroy, [facility] Cardiologist: None Electrophysiologist: None Vasc SGx: Dr. Festus Barren; Dr. Gilda Crease  Clinic Note: Chief Complaint  Patient Steele with   New Patient (Initial Visit)    Chest pain radiates shoulder c/o sob and edema ankles. Meds reviewed verbally with pt.   ===================================  ASSESSMENT/PLAN   Problem List Items Addressed This Visit       Cardiology Problems   Essential hypertension (Chronic)    Blood pressure seems to be pretty well controlled.  She is on ARB, HCTZ and amlodipine/CCB.  Not on beta-blocker.  If she were to have evidence of CAD, would probably try to see if can get a beta-blocker on board.       Relevant Medications   metoprolol tartrate (LOPRESSOR) 100 MG tablet   Other Relevant Orders   EKG 12-Lead   PAD (peripheral artery disease) (HCC) (Chronic)   Relevant Medications   metoprolol tartrate (LOPRESSOR) 100 MG tablet     Other   DOE (dyspnea on exertion)    Exertional dyspnea with some precordial pain in a patient with PAD, HTN and HLD. She is also been on cancer therapy which albeit unlikely could potentially have cardiotoxicity.  Plan: Check 2D Echo and Coronary CTA with FFR ct.       Relevant Orders   EKG 12-Lead   ECHOCARDIOGRAM COMPLETE   Basic metabolic panel (Completed)   Precordial pain - Primary (Chronic)    Some typical but also atypical features her precordial chest pain.  The description sounds relatively reasonable for cardiac disease, but it happens more at rest than with exertion.  She does however note exertional dyspnea as well.  She has significant history of PAD, HTN and HLD.  I do think that it would be reasonable to exclude ischemic CAD.  She would not let her walk on treadmill based on her deconditioning, and right bundle rash block with deep T wave inversions would be difficult to interpret.  Myoview stress test would be fraught with  difficulty based on her having left breast cancer and her body habitus.  As such, I think the best option for evaluation is a Coronary CTA. She is noting exertional dyspnea so we will also check 2D echo.  Plan: Coronary CTA with possible FFRCT along with 2D echo.       Relevant Orders   EKG 12-Lead   Basic metabolic panel (Completed)   CT CORONARY MORPH W/CTA COR W/SCORE W/CA W/CM &/OR WO/CM   Pre-procedure lab exam    She needs labs were pre-CTA. We will also need lipid panel.       Relevant Orders   Basic metabolic panel (Completed)    ===================================  HPI:    Victoria Steele is a 81 y.o. female with h/o PAD, HTN, Br Ca & prior PE who is being seen today for the evaluation of ATYPICAL CHEST PAIN at the request of Steele, Victoria Lagos, Steele.  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 Extensive RLE DVT April 2019-venous thrombectomy and thrombolytic therapy with IVC placement by Dr. Wyn Quaker, on long-term Eliquis PAD-peripheral stenting.-RLE (2019) -> most recent ABIs: Right 0.93, left 0.72-, stable  Victoria Steele was referred by Victoria Steele on May 12.  The reason was for chest pain with light activity, resolves with rest.  Recent Hospitalizations: None  Reviewed  CV studies:    The following studies were reviewed today: (if available, images/films reviewed: From Epic Chart or Care Everywhere) No pertinent cardiac procedures:   Interval History:   Victoria Steele here today for evaluation of chest discomfort which she describes really as a squeezing tightness that starts in her left arm and shoulder it radiates across the chest.  She had a bad spell yesterday as well as today.  It does occur mostly at rest, but has been noted to be worse with exertion.  The discomfort  seems to come and go without real rhyme or reason.  It can last as much as few minutes, usually if she is able to lay down to rest, it gets better.  She does say that although she does not necessarily notice this discomfort with exertion, she does have some exertional dyspnea.  She has not been very active over the last couple, indicating that she been somewhat sedentary since starting her breast cancer treatment.  She denies any PND orthopnea but does note some mild ankle swelling.  She may note some fluttering in her off-and-on but nothing prolonged.  No rapid irregular heartbeats palpitations.  The fluttering lasts maybe 1 or 2 seconds.  No syncope or near syncope.  No TIA or amaurosis fugax.  No claudication.  REVIEWED OF SYSTEMS   Review of Systems  Constitutional:  Positive for malaise/fatigue (Related to all of her therapies for her metastatic cancer.  Does not have to get up and go that she once had). Negative for weight loss.  HENT:  Negative for congestion and nosebleeds.   Respiratory:  Positive for shortness of breath (On exertion; notes being deconditioned.). Negative for cough and wheezing.   Cardiovascular:  Negative for orthopnea, leg swelling and PND.       Per HPI.  Gastrointestinal:  Positive for nausea (Sometimes associated with cancer treatments.). Negative for blood in stool and melena.  Genitourinary:  Negative for flank pain and hematuria.  Musculoskeletal:  Positive for joint pain. Negative for back pain.  Neurological:  Positive for dizziness and headaches. Negative for focal weakness and weakness.  Psychiatric/Behavioral:  Negative for depression and memory loss. The patient is nervous/anxious. The patient does not have insomnia.    I have reviewed and (if needed) personally updated the patient's problem list, medications, allergies, past medical and surgical history, social and family history.   PAST MEDICAL HISTORY   Past Medical History:  Diagnosis Date   Breast  cancer (HCC)    Cancer (HCC)    left breast   DVT (deep venous thrombosis) (HCC)    2019   Hypertension    Port catheter in place 09/14/2016   Placed 09/10/2016 RT chest.   DVT 2019: Status post thrombectomy about 4 years ago.  No significant pain or swelling post procedure.    She underwent right lower extremity revascularization about 4 years ago.  She is doing well today without any specific complaints.  No disabling claudication symptoms, ischemic rest pain, or ulceration. ABIs today are 0.92 on the right and 0.72 on the left with digit pressures of over 100 bilaterally and biphasic waveforms.  PAST SURGICAL HISTORY   Past Surgical History:  Procedure Laterality Date   ABDOMINAL HYSTERECTOMY     APPENDECTOMY     BREAST BIOPSY     CYST EXCISION Right 09/2016   Buttocks   IVC FILTER REMOVAL N/A 01/17/2018   Procedure: IVC FILTER REMOVAL;  Surgeon: Annice Needy, Steele;  Location: [facility] INVASIVE CV LAB;  Service: Cardiovascular;  Laterality: N/A;   LOWER EXTREMITY ANGIOGRAPHY Right 11/11/2017   Procedure: LOWER EXTREMITY ANGIOGRAPHY;  Surgeon: Renford Dills, Steele;  Location: [facility] INVASIVE CV LAB;  Service: Cardiovascular;  Laterality: Right;   PERIPHERAL VASCULAR THROMBECTOMY Right 08/30/2017   Procedure: PERIPHERAL VASCULAR THROMBECTOMY;  Surgeon: Annice Needy, Steele;  Location: [facility] INVASIVE CV LAB;  Service: Cardiovascular;  Laterality: Right;   PORTACATH PLACEMENT Right 09/10/2016   Procedure: INSERTION PORT-A-CATH;  Surgeon: Leafy Ro, Steele;  Location: [facility] ORS;  Service: General;  Laterality: Right;    Immunization History  Administered Date(s) Administered   Influenza, High Dose Seasonal PF 04/04/2018   Influenza-Unspecified 04/03/2019, 02/27/2020   PFIZER(Purple Top)SARS-COV-2 Vaccination 06/16/2019, 07/06/2019, 02/12/2020   Pneumococcal Polysaccharide-23 04/03/2019   Tdap 04/03/2019    MEDICATIONS/ALLERGIES   Current Meds  Medication Sig   acetaminophen (TYLENOL) 500 MG  tablet Take 500 mg by mouth daily as needed for moderate pain or headache.    amLODipine (NORVASC) 10 MG tablet Take 10 mg by mouth daily.   apixaban (ELIQUIS) 5 MG TABS tablet Take 1 tablet (5 mg total) by mouth 2 (two) times daily.   atorvastatin (LIPITOR) 40 MG tablet Take 40 mg by mouth at bedtime.   Calcium Carb-Cholecalciferol (CALCIUM 600/VITAMIN D3 PO) Take 1 tablet by mouth daily.   hydrochlorothiazide (HYDRODIURIL) 12.5 MG tablet Take 12.5 mg by mouth daily.   ibuprofen (ADVIL,MOTRIN) 400 MG tablet Take 400 mg by mouth 3 (three) times daily as needed.    letrozole (FEMARA) 2.5 MG tablet Take 1 tablet (2.5 mg total) by mouth daily.   lidocaine-prilocaine (EMLA) cream Apply 1 application. topically as needed (port access).   losartan (COZAAR) 50 MG tablet Take 1 tablet by mouth once a day  for high blood pressure- to replace 100 mg   metoprolol tartrate (LOPRESSOR) 100 MG tablet Take 1 tablet (100 mg) by mouth 2 hours prior to your Cardiac CT   palbociclib (IBRANCE) 75 MG tablet Take 1 tablet (75 mg total) by mouth daily. Take for 21 days on, 7 days off, repeat every 28 days.   PREVIDENT 5000 BOOSTER PLUS 1.1 % PSTE Place 1 application onto teeth 2 (two) times daily.    prochlorperazine (COMPAZINE) 10 MG tablet TAKE 1 TABLET BY MOUTH EVERY 6 HOURS AS NEEDED FOR NAUSEA AND VOMITING    Allergies  Allergen Reactions   Codeine Rash    hallucinations    SOCIAL HISTORY/FAMILY HISTORY   Reviewed in Epic:   Social History   Tobacco Use   Smoking status: Never   Smokeless tobacco: Never  Vaping Use   Vaping Use: Never used  Substance Use Topics   Alcohol use: No   Drug use: No   Social History   Social History Narrative   Not on file   Family History  Problem Relation Age of Onset   Parkinson's disease Mother    Heart disease Father    Bladder Cancer Father    Lung cancer Father    Heart disease Maternal Grandfather     OBJCTIVE -PE, EKG, labs   Wt Readings from  Last 3 Encounters:  08/28/21 198 lb 4 oz (89.9 kg)  07/09/21 200 lb 6.4 oz (90.9 kg)  06/24/21 200 lb 6.4 oz (90.9 kg)    Physical Exam: BP 130/60 (BP Location: Left Arm, Patient Position: Sitting, Cuff Size: Large) Comment: Breast cancer  Pulse 71  Ht '5\' 2"'$  (1.575 m)   Wt 198 lb 4 oz (89.9 kg)   SpO2 98%   BMI 36.26 kg/m  Physical Exam Vitals reviewed.  Constitutional:      General: She is not in acute distress.    Appearance: Normal appearance. She is obese. She is not ill-appearing or toxic-appearing.  HENT:     Head: Normocephalic and atraumatic.  Neck:     Vascular: No carotid bruit.  Cardiovascular:     Rate and Rhythm: Normal rate and regular rhythm.     Pulses: Normal pulses.     Heart sounds: Normal heart sounds. No murmur heard.   No friction rub. No gallop.  Pulmonary:     Effort: Pulmonary effort is normal. No respiratory distress.     Breath sounds: Normal breath sounds. No wheezing or rales.  Chest:     Chest wall: No tenderness (Mild tenderness but this is not symptomatic she notes).  Abdominal:     General: Abdomen is flat. Bowel sounds are normal. There is no distension.     Palpations: Abdomen is soft. There is no mass.     Tenderness: There is no abdominal tenderness. There is no guarding.     Comments: No HSM or bruit  Musculoskeletal:        General: Swelling (Mild bilateral puffy swelling no real edema.) present.     Cervical back: Normal range of motion and neck supple.  Skin:    General: Skin is warm and dry.  Neurological:     General: No focal deficit present.     Mental Status: She is alert and oriented to person, place, and time.     Gait: Gait normal.  Psychiatric:        Mood and Affect: Mood normal.        Behavior: Behavior normal.        Thought Content: Thought content normal.        Judgment: Judgment normal.    Adult ECG Report  Rate: 71;  Rhythm: normal sinus rhythm and RBBB with repolarization changes.  Cannot exclude  inferior MI-age-indeterminate...  Lateral ST T wave changes. ;   Narrative Interpretation:  Seem to be stable from last check.  Recent Labs: Reviewed No results found for: CHOL, HDL, LDLCALC, LDLDIRECT, TRIG, CHOLHDL Lab Results  Component Value Date   CREATININE 1.18 (H) 08/28/2021   BUN 15 08/28/2021   NA 140 08/28/2021   K 3.6 08/28/2021   CL 103 08/28/2021   CO2 22 08/28/2021      Latest Ref Rng & Units 07/09/2021    1:39 PM 04/09/2021    9:32 AM 12/24/2020    9:45 AM  CBC  WBC 4.0 - 10.5 K/uL 2.6   2.6   2.5    Hemoglobin 12.0 - 15.0 g/dL 11.9   12.1   12.0    Hematocrit 36.0 - 46.0 % 33.9   34.4   34.0    Platelets 150 - 400 K/uL 163   224   145      Lab Results  Component Value Date   HGBA1C 5.3 09/02/2017   Lab Results  Component Value Date   TSH 3.485 09/02/2017    ==================================================  COVID-19 Education: The signs and symptoms of COVID-19 were discussed with the patient and how to seek care for testing (follow up with PCP or arrange E-visit).    I spent a total of 20  minutes with the patient spent in direct  patient consultation.  Additional time spent with chart review  / charting (studies, outside notes, etc): 22 min Total Time: 42 min  Current medicines are reviewed at length with the patient today.  (+/- concerns) NA  This visit occurred during the SARS-CoV-2 public health emergency.  Safety protocols were in place, including screening questions prior to the visit, additional usage of staff PPE, and extensive cleaning of exam room while observing appropriate contact time as indicated for disinfecting solutions.  Notice: This dictation was prepared with Dragon dictation along with smart phrase technology. Any transcriptional errors that result from this process are unintentional and may not be corrected upon review.   Studies Ordered:  Orders Placed This Encounter  Procedures   CT CORONARY MORPH W/CTA COR W/SCORE W/CA  W/CM &/OR WO/CM   Basic metabolic panel   EKG 78-EUMP   ECHOCARDIOGRAM COMPLETE   Meds ordered this encounter  Medications   metoprolol tartrate (LOPRESSOR) 100 MG tablet    Sig: Take 1 tablet (100 mg) by mouth 2 hours prior to your Cardiac CT    Dispense:  1 tablet    Refill:  0    Patient Instructions / Medication Changes & Studies & Tests Ordered   Patient Instructions  Medication Instructions:  - Your physician recommends that you continue on your current medications as directed. Please refer to the Current Medication list given to you today.  *If you need a refill on your cardiac medications before your next appointment, please call your pharmacy*   Lab Work: - Your physician recommends that you return for lab work prior to your Cardiac CT:   BMP  (You may have this done now/ or up until 2 days prior to your Cardiac CT)  -[facility] Entrance at [facility] 1st desk on the right to check in (REGISTRATION) Lab hours: Monday- Friday (7:30 am- 5:30 pm)   If you have labs (blood work) drawn today and your tests are completely normal, you will receive your results only by: [place] (if you have MyChart) OR A paper copy in the mail If you have any lab test that is abnormal or we need to change your treatment, we will call you to review the results.   Testing/Procedures:  1) Echocardiogram: - Your physician has requested that you have an echocardiogram. Echocardiography is a painless test that uses sound waves to create images of your heart. It provides your doctor with information about the size and shape of your heart and how well your heart's chambers and valves are working. This procedure takes approximately one hour. There are no restrictions for this procedure. There is a possibility that an IV may need to be started during your test to inject an image enhancing agent. This is done to obtain more optimal pictures of your heart. Therefore we ask that you do at least  drink some water prior to coming in to hydrate your veins.    2) Cardiac CT: - Your physician has requested that you have cardiac CT. Cardiac computed tomography (CT) is a painless test that uses an x-ray machine to take clear, detailed pictures of your heart.    Your cardiac CT will be scheduled at  [facility] [address] [place], [place] [address] [phone]  Please arrive 15 mins early for check-in and test prep.  Please follow these instructions carefully (unless otherwise directed):   On the Night Before the Test: Be sure to Drink plenty of water. Do  not consume any caffeinated/decaffeinated beverages or chocolate 12 hours prior to your test. Do not take any antihistamines 12 hours prior to your test.   On the Day of the Test: Drink plenty of water until 1 hour prior to the test. Do not eat any food 4 hours prior to the test. You may take your regular medications prior to the test unless stated below.  Take metoprolol (Lopressor) 100 mg two hours prior to test x 1 dose. HOLD Hydrochlorothiazide morning of the test. FEMALES- please wear underwire-free bra if available, avoid dresses & tight clothing       After the Test: Drink plenty of water. After receiving IV contrast, you may experience a mild flushed feeling. This is normal. On occasion, you may experience a mild rash up to 24 hours after the test. This is not dangerous. If this occurs, you can take Benadryl 25 mg and increase your fluid intake. If you experience trouble breathing, this can be serious. If it is severe call 911 IMMEDIATELY. If it is mild, please call our office.   We will call to schedule your test understanding that some insurance companies will need an authorization prior to the service being performed.   For non-scheduling related questions, please contact the cardiac imaging nurse navigator should you have any questions/concerns: Marchia Bond,  Cardiac Imaging Nurse Navigator Gordy Clement, Cardiac Imaging Nurse Navigator [facility] Direct Office Dial: [phone]   For scheduling needs, including cancellations and rescheduling, please call [place], [phone].    Follow-Up: At [facility], you and your health needs are our priority.  As part of our continuing mission to provide you with exceptional heart care, we have created designated Provider Care Teams.  These Care Teams include your primary Cardiologist (physician) and Advanced Practice Providers (APPs -  Physician Assistants and Nurse Practitioners) who all work together to provide you with the care you need, when you need it.  We recommend signing up for the patient portal called "MyChart".  Sign up information is provided on this After Visit Summary.  MyChart is used to connect with patients for Virtual Visits (Telemedicine).  Patients are able to view lab/test results, encounter notes, upcoming appointments, etc.  Non-urgent messages can be sent to your provider as well.   To learn more about what you can do with MyChart, go to [email or web address].    Your next appointment:   6 week(s)  The format for your next appointment:   In Person  Provider:   Glenetta Hew, Steele    Other Instructions  Echocardiogram An echocardiogram is a test that uses sound waves (ultrasound) to produce images of the heart. Images from an echocardiogram can provide important information about: Heart size and shape. The size and thickness and movement of your heart's walls. Heart muscle function and strength. Heart valve function or if you have stenosis. Stenosis is when the heart valves are too narrow. If blood is flowing backward through the heart valves (regurgitation). A tumor or infectious growth around the heart valves. Areas of heart muscle that are not working well because of poor blood flow or injury from a heart attack. Aneurysm detection.  An aneurysm is a weak or damaged part of an artery wall. The wall bulges out from the normal force of blood pumping through the body. Tell a health care provider about: Any allergies you have. All medicines you are taking, including vitamins, herbs, eye drops, creams, and over-the-counter medicines. Any blood disorders you have.  Any surgeries you have had. Any medical conditions you have. Whether you are pregnant or may be pregnant. What are the risks? Generally, this is a safe test. However, problems may occur, including an allergic reaction to dye (contrast) that may be used during the test. What happens before the test? No specific preparation is needed. You may eat and drink normally. What happens during the test?  You will take off your clothes from the waist up and put on a hospital gown. Electrodes or electrocardiogram (ECG)patches may be placed on your chest. The electrodes or patches are then connected to a device that monitors your heart rate and rhythm. You will lie down on a table for an ultrasound exam. A gel will be applied to your chest to help sound waves pass through your skin. A handheld device, called a transducer, will be pressed against your chest and moved over your heart. The transducer produces sound waves that travel to your heart and bounce back (or "echo" back) to the transducer. These sound waves will be captured in real-time and changed into images of your heart that can be viewed on a video monitor. The images will be recorded on a computer and reviewed by your health care provider. You may be asked to change positions or hold your breath for a short time. This makes it easier to get different views or better views of your heart. In some cases, you may receive contrast through an IV in one of your veins. This can improve the quality of the pictures from your heart. The procedure may vary among health care providers and hospitals. What can I expect after the  test? You may return to your normal, everyday life, including diet, activities, and medicines, unless your health care provider tells you not to do that. Follow these instructions at home: It is up to you to get the results of your test. Ask your health care provider, or the department that is doing the test, when your results will be ready. Keep all follow-up visits. This is important. Summary An echocardiogram is a test that uses sound waves (ultrasound) to produce images of the heart. Images from an echocardiogram can provide important information about the size and shape of your heart, heart muscle function, heart valve function, and other possible heart problems. You do not need to do anything to prepare before this test. You may eat and drink normally. After the echocardiogram is completed, you may return to your normal, everyday life, unless your health care provider tells you not to do that. This information is not intended to replace advice given to you by your health care provider. Make sure you discuss any questions you have with your health care provider. Document Revised: 12/11/2020 Document Reviewed: 11/21/2019 Elsevier Patient Education  [employer].    Cardiac CT Angiogram A cardiac CT angiogram is a procedure to look at the heart and the area around the heart. It may be done to help find the cause of chest pains or other symptoms of heart disease. During this procedure, a substance called contrast dye is injected into the blood vessels in the area to be checked. A large X-ray machine, called a CT scanner, then takes detailed pictures of the heart and the surrounding area. The procedure is also sometimes called a coronary CT angiogram, coronary artery scanning, or CTA. A cardiac CT angiogram allows the health care provider to see how well blood is flowing to and from the heart. The health care  provider will be able to see if there are any problems, such as: Blockage or  narrowing of the coronary arteries in the heart. Fluid around the heart. Signs of weakness or disease in the muscles, valves, and tissues of the heart. Tell a health care provider about: Any allergies you have. This is especially important if you have had a previous allergic reaction to contrast dye. All medicines you are taking, including vitamins, herbs, eye drops, creams, and over-the-counter medicines. Any blood disorders you have. Any surgeries you have had. Any medical conditions you have. Whether you are pregnant or may be pregnant. Any anxiety disorders, chronic pain, or other conditions you have that may increase your stress or prevent you from lying still. What are the risks? Generally, this is a safe procedure. However, problems may occur, including: Bleeding. Infection. Allergic reactions to medicines or dyes. Damage to other structures or organs. Kidney damage from the contrast dye that is used. Increased risk of cancer from radiation exposure. This risk is low. Talk with your health care provider about: The risks and benefits of testing. How you can receive the lowest dose of radiation. What happens before the procedure? Wear comfortable clothing and remove any jewelry, glasses, dentures, and hearing aids. Follow instructions from your health care provider about eating and drinking. This may include: For 12 hours before the procedure -- avoid caffeine. This includes tea, coffee, soda, energy drinks, and diet pills. Drink plenty of water or other fluids that do not have caffeine in them. Being well hydrated can prevent complications. For 4-6 hours before the procedure -- stop eating and drinking. The contrast dye can cause nausea, but this is less likely if your stomach is empty. Ask your health care provider about changing or stopping your regular medicines. This is especially important if you are taking diabetes medicines, blood thinners, or medicines to treat problems with  erections (erectile dysfunction). What happens during the procedure?  Hair on your chest may need to be removed so that small sticky patches called electrodes can be placed on your chest. These will transmit information that helps to monitor your heart during the procedure. An IV will be inserted into one of your veins. You might be given a medicine to control your heart rate during the procedure. This will help to ensure that good images are obtained. You will be asked to lie on an exam table. This table will slide in and out of the CT machine during the procedure. Contrast dye will be injected into the IV. You might feel warm, or you may get a metallic taste in your mouth. You will be given a medicine called nitroglycerin. This will relax or dilate the arteries in your heart. The table that you are lying on will move into the CT machine tunnel for the scan. The person running the machine will give you instructions while the scans are being done. You may be asked to: Keep your arms above your head. Hold your breath. Stay very still, even if the table is moving. When the scanning is complete, you will be moved out of the machine. The IV will be removed. The procedure may vary among health care providers and hospitals. What can I expect after the procedure? After your procedure, it is common to have: A metallic taste in your mouth from the contrast dye. A feeling of warmth. A headache from the nitroglycerin. Follow these instructions at home: Take over-the-counter and prescription medicines only as told by your health care  provider. If you are told, drink enough fluid to keep your urine pale yellow. This will help to flush the contrast dye out of your body. Most people can return to their normal activities right after the procedure. Ask your health care provider what activities are safe for you. It is up to you to get the results of your procedure. Ask your health care provider, or the  department that is doing the procedure, when your results will be ready. Keep all follow-up visits as told by your health care provider. This is important. Contact a health care provider if: You have any symptoms of allergy to the contrast dye. These include: Shortness of breath. Rash or hives. A racing heartbeat. Summary A cardiac CT angiogram is a procedure to look at the heart and the area around the heart. It may be done to help find the cause of chest pains or other symptoms of heart disease. During this procedure, a large X-ray machine, called a CT scanner, takes detailed pictures of the heart and the surrounding area after a contrast dye has been injected into blood vessels in the area. Ask your health care provider about changing or stopping your regular medicines before the procedure. This is especially important if you are taking diabetes medicines, blood thinners, or medicines to treat erectile dysfunction. If you are told, drink enough fluid to keep your urine pale yellow. This will help to flush the contrast dye out of your body. This information is not intended to replace advice given to you by your health care provider. Make sure you discuss any questions you have with your health care provider. Document Revised: 12/11/2020 Document Reviewed: 11/23/2018 Elsevier Patient Education  2023 Else[employer] Inc.  Important Information About Sugar          Glenetta Hew, M.D., M.S. Interventional Cardiologist   Pager # [phone] Phone # [phone] [address]. [place], [place] [address]   Thank you for choosing [facility] [place]!!

## 2021-08-29 ENCOUNTER — Telehealth: Payer: Self-pay | Admitting: Cardiology

## 2021-08-29 NOTE — Telephone Encounter (Signed)
I called and spoke with the patient regarding her lab results.  She is aware she will need to hydrate orally the day prior to her procedure & she will need to receive a 500 cc NS IV bolus of fluid the day of her scan upon arrival.  The patient voices understanding and is agreeable.  Reached out to [employer], [place] at [facility] and appointment arranged for Monday 09/01/21 at 11:00 am. Patient aware to arrive 1 hour prior to her scan for IV hydration.

## 2021-08-29 NOTE — Telephone Encounter (Signed)
Victoria Man, MD  08/28/2021 11:04 PM EDT     Pre-CT labs show mildly elevated creatinine levels.  Not that different from baseline.   Recommend increased p.o. hydration on the day prior to her CT scan, then if possible for her to have 500 mL NS bolus around the time of her CT scan.   Glenetta Hew, MD

## 2021-08-30 ENCOUNTER — Encounter: Payer: Self-pay | Admitting: Cardiology

## 2021-08-30 DIAGNOSIS — I2510 Atherosclerotic heart disease of native coronary artery without angina pectoris: Secondary | ICD-10-CM

## 2021-08-30 DIAGNOSIS — Z01812 Encounter for preprocedural laboratory examination: Secondary | ICD-10-CM | POA: Insufficient documentation

## 2021-08-30 HISTORY — DX: Atherosclerotic heart disease of native coronary artery without angina pectoris: I25.10

## 2021-08-30 NOTE — Assessment & Plan Note (Signed)
Some typical but also atypical features her precordial chest pain.  The description sounds relatively reasonable for cardiac disease, but it happens more at rest than with exertion.  She does however note exertional dyspnea as well.  She has significant history of PAD, HTN and HLD.  I do think that it would be reasonable to exclude ischemic CAD.  She would not let her walk on treadmill based on her deconditioning, and right bundle rash block with deep T wave inversions would be difficult to interpret.  Myoview stress test would be fraught with difficulty based on her having left breast cancer and her body habitus.  As such, I think the best option for evaluation is a Coronary CTA. She is noting exertional dyspnea so we will also check 2D echo.  Plan: Coronary CTA with possible FFRCT along with 2D echo.

## 2021-08-30 NOTE — Assessment & Plan Note (Signed)
Exertional dyspnea with some precordial pain in a patient with PAD, HTN and HLD. She is also been on cancer therapy which albeit unlikely could potentially have cardiotoxicity.  Plan: Check 2D Echo and Coronary CTA with FFR ct.

## 2021-08-30 NOTE — Assessment & Plan Note (Signed)
Blood pressure seems to be pretty well controlled.  She is on ARB, HCTZ and amlodipine/CCB.  Not on beta-blocker.  If she were to have evidence of CAD, would probably try to see if can get a beta-blocker on board.

## 2021-08-30 NOTE — Assessment & Plan Note (Addendum)
She needs labs were pre-CTA. We will also need lipid panel.

## 2021-09-01 ENCOUNTER — Other Ambulatory Visit (HOSPITAL_COMMUNITY): Payer: Self-pay

## 2021-09-01 ENCOUNTER — Ambulatory Visit
Admission: RE | Admit: 2021-09-01 | Discharge: 2021-09-01 | Disposition: A | Discharge status: Home / Self Care | Payer: Medicare Other | Source: Ambulatory Visit | Attending: Family Medicine | Admitting: Family Medicine

## 2021-09-01 DIAGNOSIS — R072 Precordial pain: Secondary | ICD-10-CM | POA: Diagnosis present

## 2021-09-01 DIAGNOSIS — I2510 Atherosclerotic heart disease of native coronary artery without angina pectoris: Secondary | ICD-10-CM | POA: Diagnosis not present

## 2021-09-01 HISTORY — PX: CT CTA CORONARY W/CA SCORE W/CM &/OR WO/CM: HXRAD787

## 2021-09-01 MED ORDER — SODIUM CHLORIDE 0.9 % IV SOLN: Freq: Once | INTRAVENOUS | Status: AC

## 2021-09-01 MED ORDER — IOHEXOL 350 MG/ML SOLN
100.0000 mL | Freq: Once | INTRAVENOUS | Status: AC | PRN
  Administered 2021-09-01: 100 mL via INTRAVENOUS

## 2021-09-01 MED ORDER — NITROGLYCERIN 0.4 MG SL SUBL
0.8000 mg | SUBLINGUAL_TABLET | Freq: Once | SUBLINGUAL | Status: AC
Start: 2021-09-01 — End: 2021-09-01
  Administered 2021-09-01: 0.8 mg via SUBLINGUAL

## 2021-09-01 NOTE — Progress Notes (Signed)
Patient tolerated procedure well. Ambulate w/o difficulty. Denies light headedness or being dizzy. Sitting in chair drinking water provided. IV hydration of 500 ml complete. Encouraged to drink extra water today and reasoning explained. Verbalized understanding. All questions answered. ABC intact. No further needs. Discharge from procedure area w/o issues.

## 2021-09-04 ENCOUNTER — Telehealth: Payer: Self-pay | Admitting: Cardiology

## 2021-09-04 ENCOUNTER — Other Ambulatory Visit (HOSPITAL_COMMUNITY): Payer: Self-pay

## 2021-09-04 NOTE — Telephone Encounter (Signed)
I spoke with the patient and advised her of her Cardiac CT results as per Dr. Ellyn Hack. She is also aware of the need to move up her follow up appointment to sooner than 10/09/21 to discuss these findings with the providers and possibly arrange for a heart catheterization.  The patient voices understanding of these results and is agreeable.  I have offered her an appointment on 09/11/21 with Dr. Ellyn Hack and she would like to see him this day at 3:20 pm.  The patient is aware we will keep her echo (6/21) & 6/29 appointment with Dr. Ellyn Hack as planned.  The patient was appreciative for the call.

## 2021-09-04 NOTE — Telephone Encounter (Signed)
Leonie Man, MD  09/04/2021 11:28 AM EDT     Coronary CT angiogram results:   Coronary Calcium Score is 788 which is elevated. There appears to be a significant narrowing in the right coronary artery with moderate disease in the left anterior descending artery and mild to moderate disease in the circumflex.   With these results, the recommendation would be to consider cardiac catheterization to definitively evaluate these narrowings.   She needs to be seen to discuss results and potential consideration of cardiac catheterization.  She is scheduled to see me on June 29, this should probably be pushed forward to see me or an APP.  We can keep the June 29 appointment for post procedure.   [place]

## 2021-09-04 NOTE — Telephone Encounter (Signed)
Attempted to call the patient. No answer- I left a message to please call back.  

## 2021-09-09 ENCOUNTER — Other Ambulatory Visit: Payer: Self-pay | Admitting: *Deleted

## 2021-09-09 DIAGNOSIS — C7951 Secondary malignant neoplasm of bone: Secondary | ICD-10-CM

## 2021-09-09 MED ORDER — LETROZOLE 2.5 MG PO TABS: 2.5000 mg | ORAL_TABLET | Freq: Every day | ORAL | 5 refills | Status: DC

## 2021-09-11 ENCOUNTER — Ambulatory Visit: Payer: Medicare Other | Admitting: Cardiology

## 2021-09-11 ENCOUNTER — Encounter: Payer: Self-pay | Admitting: Cardiology

## 2021-09-11 VITALS — BP 134/60 | HR 89 | Ht 62.0 in | Wt 200.8 lb

## 2021-09-11 DIAGNOSIS — I10 Essential (primary) hypertension: Secondary | ICD-10-CM | POA: Diagnosis not present

## 2021-09-11 DIAGNOSIS — I739 Peripheral vascular disease, unspecified: Secondary | ICD-10-CM

## 2021-09-11 DIAGNOSIS — R072 Precordial pain: Secondary | ICD-10-CM

## 2021-09-11 DIAGNOSIS — R931 Abnormal findings on diagnostic imaging of heart and coronary circulation: Secondary | ICD-10-CM

## 2021-09-11 DIAGNOSIS — R0609 Other forms of dyspnea: Secondary | ICD-10-CM

## 2021-09-11 DIAGNOSIS — I82511 Chronic embolism and thrombosis of right femoral vein: Secondary | ICD-10-CM

## 2021-09-11 DIAGNOSIS — Z01812 Encounter for preprocedural laboratory examination: Secondary | ICD-10-CM

## 2021-09-11 MED ORDER — METOPROLOL SUCCINATE ER 25 MG PO TB24: 25.0000 mg | ORAL_TABLET | Freq: Every day | ORAL | 6 refills | Status: DC

## 2021-09-11 MED ORDER — ISOSORBIDE MONONITRATE ER 30 MG PO TB24: 30.0000 mg | ORAL_TABLET | Freq: Every day | ORAL | 6 refills | Status: DC

## 2021-09-11 NOTE — Assessment & Plan Note (Signed)
Coronary CTA suggests significant RCA disease.  Potentially the culprit for exertional symptoms.  Recommendation is cardiac catheterization:  She would like to wait the results of her echocardiogram, bone scan and CT scans etc. they are coming up in a couple weeks.  Plan will be to optimize medical therapy with adding beta-blocker and Imdur.  We will then reassess her symptoms on June 29 which is a result appointment.  I did admonished her that if she were to have more symptoms that she had a day, and it did not go away, last longer or happen more frequently, we should probably not wait that long to proceed.  We have already discussed the procedure in detail as noted below.  If necessary, she would agree to proceed.  Shared Decision Making/Informed Consent The risks [stroke (1 in 1000), death (1 in 1000), kidney failure [usually temporary] (1 in 500), bleeding (1 in 200), allergic reaction [possibly serious] (1 in 200)], benefits (diagnostic support and management of coronary artery disease) and alternatives of a cardiac catheterization were discussed in detail with Victoria Steele and she is willing to proceed.

## 2021-09-11 NOTE — Patient Instructions (Signed)
Medication Instructions:  START Imdur 30 mg every morning - take 1/2 tablet for the first 2 days START Toprol XL 25 mg every night   *If you need a refill on your cardiac medications before your next appointment, please call your pharmacy*   Lab Work: None If you have labs (blood work) drawn today and your tests are completely normal, you will receive your results only by: [place] (if you have MyChart) OR A paper copy in the mail If you have any lab test that is abnormal or we need to change your treatment, we will call you to review the results.   Testing/Procedures: Echocardiogram as scheduled on 10/01/21   Follow-Up: At [facility], you and your health needs are our priority.  As part of our continuing mission to provide you with exceptional heart care, we have created designated Provider Care Teams.  These Care Teams include your primary Cardiologist (physician) and Advanced Practice Providers (APPs -  Physician Assistants and Nurse Practitioners) who all work together to provide you with the care you need, when you need it.  We recommend signing up for the patient portal called "MyChart".  Sign up information is provided on this After Visit Summary.  MyChart is used to connect with patients for Virtual Visits (Telemedicine).  Patients are able to view lab/test results, encounter notes, upcoming appointments, etc.  Non-urgent messages can be sent to your provider as well.   To learn more about what you can do with MyChart, go to [email or web address].    Your next appointment:   Dr. Ellyn Hack 10/09/21  The format for your next appointment:   In Person  Provider:   Glenetta Hew, MD{   Other Instructions Call if your symptoms do not improve and we will schedule you for cardiac cath  Important Information About Sugar

## 2021-09-11 NOTE — Assessment & Plan Note (Signed)
She is on ARB and HCTZ along with amlodipine.  Not currently on beta-blocker-with evidence of CAD, will start low-dose beta-blocker (Toprol 25 mg daily).  We will also add Imdur 30 mg daily.Victoria Steele

## 2021-09-11 NOTE — Assessment & Plan Note (Signed)
2D echo pending -> plan to evaluate for possible cardiomyopathy related to cancer therapy.  Abnormal Coronary CTA -> recommendations cardiac catheterization.

## 2021-09-11 NOTE — Assessment & Plan Note (Signed)
Monitored by Dr. Lucky Cowboy.  Stable ABIs. Roughly 4 years out from right lower extremity revascularization.  Continuing cardiovascular risk factor modification.

## 2021-09-11 NOTE — Progress Notes (Addendum)
Primary Care Provider: Oswaldo Conroy, MD [facility] Cardiologist: None Electrophysiologist: None Vasc SGx: Dr. Festus Barren; Dr. Gilda Crease  Clinic Note: Chief Complaint  Patient presents with   Follow-up    Test results-Coronary CTA   ===================================  ASSESSMENT/PLAN   Problem List Items Addressed This Visit       Cardiology Problems   Essential hypertension (Chronic)    She is on ARB and HCTZ along with amlodipine.  Not currently on beta-blocker-with evidence of CAD, will start low-dose beta-blocker (Toprol 25 mg daily).  We will also add Imdur 30 mg daily..      Relevant Medications   metoprolol succinate (TOPROL XL) 25 MG 24 hr tablet   isosorbide mononitrate (IMDUR) 30 MG 24 hr tablet   Other Relevant Orders   EKG 12-Lead (Completed)   DVT (deep venous thrombosis) (HCC)    Remains on Eliquis.  This would need to be held at least 24 hours Be precath.  She would need to be on double therapy for short period of time post catheter PCI indicated.      Relevant Medications   metoprolol succinate (TOPROL XL) 25 MG 24 hr tablet   isosorbide mononitrate (IMDUR) 30 MG 24 hr tablet   PAD (peripheral artery disease) (HCC) (Chronic)    Monitored by Dr. Wyn Quaker.  Stable ABIs. Roughly 4 years out from right lower extremity revascularization.  Continuing cardiovascular risk factor modification.      Relevant Medications   metoprolol succinate (TOPROL XL) 25 MG 24 hr tablet   isosorbide mononitrate (IMDUR) 30 MG 24 hr tablet   Other Relevant Orders   EKG 12-Lead (Completed)     Other   Abnormal cardiac CT angiography - Primary    Coronary CTA suggests significant RCA disease.  Potentially the culprit for exertional symptoms.  Recommendation is cardiac catheterization: She would like to wait the results of her echocardiogram, bone scan and CT scans etc. they are coming up in a couple weeks.  Plan will be to optimize medical therapy with adding  beta-blocker and Imdur.  We will then reassess her symptoms on June 29 which is a result appointment. I did admonished her that if she were to have more symptoms that she had a day, and it did not go away, last longer or happen more frequently, we should probably not wait that long to proceed.  We have already discussed the procedure in detail as noted below.  If necessary, she would agree to proceed.  Shared Decision Making/Informed Consent The risks [stroke (1 in 1000), death (1 in 1000), kidney failure [usually temporary] (1 in 500), bleeding (1 in 200), allergic reaction [possibly serious] (1 in 200)], benefits (diagnostic support and management of coronary artery disease) and alternatives of a cardiac catheterization were discussed in detail with Ms. Shippy and she is willing to proceed.       Relevant Orders   EKG 12-Lead (Completed)   DOE (dyspnea on exertion) (Chronic)    2D echo pending -> plan to evaluate for possible cardiomyopathy related to cancer therapy.  Abnormal Coronary CTA -> recommendations cardiac catheterization.      Relevant Orders   EKG 12-Lead (Completed)   Precordial pain (Chronic)    Combination of typical and atypical features-evaluated with Coronary CTA that is now read as positive with likely occlusive RCA lesion.  Recommendation is invasive evaluation with cardiac catheterization. 2D echo pending.  Plan: She would like to give a trial of medical management. Start Toprol  25 mg every afternoon along with Imdur 30 mg every morning. She is already on 10 mg of amlodipine for antianginal benefit. Continue losartan and atorvastatin. We will delay scheduling cardiac catheterization till I see her back on June 29.  This will help time for [place] to see the results of her echocardiogram but also her bone scan and CT abdomen pelvis with bone scan.  Once she has all these test results she will build to make a better decision about invasive procedures or not.  This also  gives [place] time to see the results of the echocardiogram which could alter the decision making.      Relevant Orders   EKG 12-Lead (Completed)   Pre-procedure lab exam   Relevant Orders   EKG 12-Lead (Completed)    ===================================  HPI:    KYTZIA GIENGER is a 81 y.o. female with h/o PAD, HTN, Br Ca & prior PE who is being seen today for Follow-Up Evaluation of ATYPICAL CHEST PAIN-now with Abnormal Coronary CTA.  She returns today at the request of Letta Median, MD.  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 Extensive RLE DVT April 2019-venous thrombectomy and thrombolytic therapy with IVC placement by Dr. Lucky Cowboy, on long-term Eliquis PAD-peripheral stenting.-RLE (2019) -> most recent ABIs: Right 0.93, left 0.72-, stable  KRIMSON MASSMANN was seen in initial consultation on Aug 28, 2021 for chest pain with light activity resolving at rest.  Pain described as a squeezing tightness starting in the left arm and shoulder radiate across the chest.  Symptoms can last few minutes.  Get better with resting.  Also noted having some exertional dyspnea, but acknowledges being sedentary since starting breast cancer treatment.  No CHF symptoms or prolonged arrhythmia symptoms-intermittent fluttering lasting a few seconds.  Recent Hospitalizations: None  Reviewed  CV studies:    The following studies were reviewed today: (if available, images/films reviewed: From Epic Chart or Care Everywhere) Coronary CTA 09/01/2021: Coronary Calcium Score 788.  Combination of calcified and noncalcified plaque-severe lesion in mid RCA (estimated greater than 70%).  Moderate LAD and LCx 50 to 69%). => CAD RADS 4 with severe stenosis suggested in the RCA.  FFRCT not submitted due to motion artifact.-Recommended cardiac  catheterization. 2D echo pending   Interval History:   KARLETTA MILLAY presents here today for follow-up to discuss results of her Coronary CTA  REVIEWED OF SYSTEMS   Review of Systems  Constitutional:  Positive for malaise/fatigue (Related to all of her therapies for her metastatic cancer.  Does not have to get up and go that she once had). Negative for weight loss.  HENT:  Negative for congestion and nosebleeds.   Respiratory:  Positive for shortness of breath (On exertion; notes being deconditioned.). Negative for cough and wheezing.   Cardiovascular:  Negative for orthopnea, leg swelling and PND.       Per HPI.  Gastrointestinal:  Positive for nausea (Sometimes associated with cancer treatments.). Negative for blood in stool and melena.  Genitourinary:  Negative for flank pain and hematuria.  Musculoskeletal:  Positive for joint pain. Negative for back pain.  Neurological:  Positive for dizziness and headaches. Negative for focal weakness and weakness.  Psychiatric/Behavioral:  Negative for depression and memory loss. The patient is nervous/anxious. The patient does not have insomnia.     I have  reviewed and (if needed) personally updated the patient's problem list, medications, allergies, past medical and surgical history, social and family history.   PAST MEDICAL HISTORY   Past Medical History:  Diagnosis Date   Breast cancer ([place])    Cancer ([place])    left breast   Coronary artery disease 08/30/2021   Coronary Calcium Score 788.  Combination of calcified and noncalcified plaque-severe lesion in mid RCA (estimated greater than 70%).  Moderate LAD and LCx 50 to 69%). => CAD RADS 4 with severe stenosis suggested in the RCA.  FFRCT not submitted due to motion artifact.-Recommended cardiac catheterization.   DVT (deep venous thrombosis) ([place])    2019   Hypertension    Port catheter in place 09/14/2016   Placed 09/10/2016 RT chest.   DVT 2019: Status post thrombectomy about 4  years ago.  No significant pain or swelling post procedure.    She underwent right lower extremity revascularization about 4 years ago.  She is doing well today without any specific complaints.  No disabling claudication symptoms, ischemic rest pain, or ulceration. ABIs today are 0.92 on the right and 0.72 on the left with digit pressures of over 100 bilaterally and biphasic waveforms.  PAST SURGICAL HISTORY   Past Surgical History:  Procedure Laterality Date   ABDOMINAL HYSTERECTOMY     APPENDECTOMY     BREAST BIOPSY     CT CTA CORONARY W/CA SCORE W/CM &/OR WO/CM  09/01/2021   Coronary Calcium Score 788.  Combination of calcified and noncalcified plaque-severe lesion in mid RCA (estimated greater than 70%).  Moderate LAD and LCx 50 to 69%). => CAD RADS 4 with severe stenosis suggested in the RCA.  FFRCT not submitted due to motion artifact.-Recommended cardiac catheterization.   CYST EXCISION Right 09/2016   Buttocks   IVC FILTER REMOVAL N/A 01/17/2018   Procedure: IVC FILTER REMOVAL;  Surgeon: Algernon Huxley, MD;  Location: [place] [facility];  Service: Cardiovascular;  Laterality: N/A;   LOWER EXTREMITY ANGIOGRAPHY Right 11/11/2017   Procedure: LOWER EXTREMITY ANGIOGRAPHY;  Surgeon: Katha Cabal, MD;  Location: [place] [facility];  Service: Cardiovascular;  Laterality: Right;   PERIPHERAL VASCULAR THROMBECTOMY Right 08/30/2017   Procedure: PERIPHERAL VASCULAR THROMBECTOMY;  Surgeon: Algernon Huxley, MD;  Location: [place] [facility];  Service: Cardiovascular;  Laterality: Right;   PORTACATH PLACEMENT Right 09/10/2016   Procedure: INSERTION PORT-A-CATH;  Surgeon: Jules Husbands, MD;  Location: [facility];  Service: General;  Laterality: Right;    Immunization History  Administered Date(s) Administered   Influenza, High Dose Seasonal PF 04/04/2018   Influenza-Unspecified 04/03/2019, 02/27/2020   PFIZER(Purple Top)SARS-COV-2 Vaccination 06/16/2019, 07/06/2019, 02/12/2020    Pneumococcal Polysaccharide-23 04/03/2019   Tdap 04/03/2019    MEDICATIONS/ALLERGIES   Current Meds  Medication Sig   acetaminophen (TYLENOL) 500 MG tablet Take 500 mg by mouth daily as needed for moderate pain or headache.    amLODipine (NORVASC) 10 MG tablet Take 10 mg by mouth daily.   apixaban (ELIQUIS) 5 MG TABS tablet Take 1 tablet (5 mg total) by mouth 2 (two) times daily.   atorvastatin (LIPITOR) 40 MG tablet Take 40 mg by mouth at bedtime.   Calcium Carb-Cholecalciferol (CALCIUM 600/VITAMIN D3 PO) Take 1 tablet by mouth daily.   hydrochlorothiazide (HYDRODIURIL) 12.5 MG tablet Take 12.5 mg by mouth daily.   ibuprofen (ADVIL,MOTRIN) 400 MG tablet Take 400 mg by mouth 3 (three) times daily as needed.    isosorbide mononitrate (IMDUR) 30  MG 24 hr tablet Take 1 tablet (30 mg total) by mouth daily.   letrozole (FEMARA) 2.5 MG tablet Take 1 tablet (2.5 mg total) by mouth daily.   lidocaine-prilocaine (EMLA) cream Apply 1 application. topically as needed (port access).   losartan (COZAAR) 50 MG tablet Take 1 tablet by mouth once a day  for high blood pressure- to replace 100 mg   metoprolol succinate (TOPROL XL) 25 MG 24 hr tablet Take 1 tablet (25 mg total) by mouth daily.   palbociclib (IBRANCE) 75 MG tablet Take 1 tablet (75 mg total) by mouth daily. Take for 21 days on, 7 days off, repeat every 28 days.   PREVIDENT 5000 BOOSTER PLUS 1.1 % PSTE Place 1 application onto teeth 2 (two) times daily.  (Patient not taking: Reported on 10/09/2021)   prochlorperazine (COMPAZINE) 10 MG tablet TAKE 1 TABLET BY MOUTH EVERY 6 HOURS AS NEEDED FOR NAUSEA AND VOMITING   [DISCONTINUED] metoprolol tartrate (LOPRESSOR) 100 MG tablet Take 1 tablet (100 mg) by mouth 2 hours prior to your Cardiac CT    Allergies  Allergen Reactions   Codeine Rash    hallucinations    SOCIAL HISTORY/FAMILY HISTORY   Reviewed in Epic:   Social History   Tobacco Use   Smoking status: Never   Smokeless tobacco:  Never  Vaping Use   Vaping Use: Never used  Substance Use Topics   Alcohol use: No   Drug use: No   Social History   Social History Narrative   Not on file   Family History  Problem Relation Age of Onset   Parkinson's disease Mother    Heart disease Father    Bladder Cancer Father    Lung cancer Father    Heart disease Maternal Grandfather     OBJCTIVE -PE, EKG, labs   Wt Readings from Last 3 Encounters:  10/09/21 200 lb 2 oz (90.8 kg)  09/11/21 200 lb 12.8 oz (91.1 kg)  08/28/21 198 lb 4 oz (89.9 kg)    Physical Exam: BP 134/60   Pulse 89   Ht $R'5\' 2"'TE$  (1.575 m)   Wt 200 lb 12.8 oz (91.1 kg)   SpO2 97%   BMI 36.73 kg/m  Physical Exam Vitals reviewed.  Constitutional:      General: She is not in acute distress.    Appearance: Normal appearance. She is obese. She is not ill-appearing or toxic-appearing.  HENT:     Head: Normocephalic and atraumatic.  Neck:     Vascular: No carotid bruit.  Cardiovascular:     Rate and Rhythm: Normal rate and regular rhythm.     Pulses: Normal pulses.     Heart sounds: Normal heart sounds. No murmur heard.    No friction rub. No gallop.  Pulmonary:     Effort: Pulmonary effort is normal. No respiratory distress.     Breath sounds: Normal breath sounds. No wheezing or rales.  Chest:     Chest wall: No tenderness (Mild tenderness but this is not symptomatic she notes).  Abdominal:     General: Abdomen is flat. Bowel sounds are normal. There is no distension.     Palpations: Abdomen is soft. There is no mass.     Tenderness: There is no abdominal tenderness. There is no guarding.     Comments: No HSM or bruit  Musculoskeletal:        General: Swelling (Mild bilateral puffy swelling no real edema.) present.     Cervical back:  Normal range of motion and neck supple.  Skin:    General: Skin is warm and dry.  Neurological:     General: No focal deficit present.     Mental Status: She is alert and oriented to person, place, and  time.     Gait: Gait normal.  Psychiatric:        Mood and Affect: Mood normal.        Behavior: Behavior normal.        Thought Content: Thought content normal.        Judgment: Judgment normal.     Adult ECG Report  Rate: 71;  Rhythm: normal sinus rhythm and RBBB with repolarization changes.  Cannot exclude inferior MI-age-indeterminate...  Lateral ST T wave changes. ;   Narrative Interpretation:  Seem to be stable from last check.  Recent Labs: Reviewed No results found for: "CHOL", "HDL", "LDLCALC", "LDLDIRECT", "TRIG", "CHOLHDL" Lab Results  Component Value Date   CREATININE 0.90 10/06/2021   BUN 15 08/28/2021   NA 140 08/28/2021   K 3.6 08/28/2021   CL 103 08/28/2021   CO2 22 08/28/2021      Latest Ref Rng & Units 07/09/2021    1:39 PM 04/09/2021    9:32 AM 12/24/2020    9:45 AM  CBC  WBC 4.0 - 10.5 K/uL 2.6  2.6  2.5   Hemoglobin 12.0 - 15.0 g/dL 11.9  12.1  12.0   Hematocrit 36.0 - 46.0 % 33.9  34.4  34.0   Platelets 150 - 400 K/uL 163  224  145     Lab Results  Component Value Date   HGBA1C 5.3 09/02/2017   Lab Results  Component Value Date   TSH 3.485 09/02/2017    ==================================================  COVID-19 Education: The signs and symptoms of COVID-19 were discussed with the patient and how to seek care for testing (follow up with PCP or arrange E-visit).    I spent a total of 20  minutes with the patient spent in direct patient consultation.  Additional time spent with chart review  / charting (studies, outside notes, etc): 22 min Total Time: 42 min  Current medicines are reviewed at length with the patient today.  (+/- concerns) NA  This visit occurred during the SARS-CoV-2 public health emergency.  Safety protocols were in place, including screening questions prior to the visit, additional usage of staff PPE, and extensive cleaning of exam room while observing appropriate contact time as indicated for disinfecting  solutions.  Notice: This dictation was prepared with Dragon dictation along with smart phrase technology. Any transcriptional errors that result from this process are unintentional and may not be corrected upon review.   Studies Ordered:  Orders Placed This Encounter  Procedures   EKG 12-Lead   Meds ordered this encounter  Medications   metoprolol succinate (TOPROL XL) 25 MG 24 hr tablet    Sig: Take 1 tablet (25 mg total) by mouth daily.    Dispense:  30 tablet    Refill:  6   isosorbide mononitrate (IMDUR) 30 MG 24 hr tablet    Sig: Take 1 tablet (30 mg total) by mouth daily.    Dispense:  30 tablet    Refill:  6    Patient Instructions / Medication Changes & Studies & Tests Ordered   Patient Instructions  Medication Instructions:  START Imdur 30 mg every morning - take 1/2 tablet for the first 2 days START Toprol XL 25 mg every night   *  If you need a refill on your cardiac medications before your next appointment, please call your pharmacy*   Lab Work: None If you have labs (blood work) drawn today and your tests are completely normal, you will receive your results only by: [place] (if you have MyChart) OR A paper copy in the mail If you have any lab test that is abnormal or we need to change your treatment, we will call you to review the results.   Testing/Procedures: Echocardiogram as scheduled on 10/01/21   Follow-Up: At [facility], you and your health needs are our priority.  As part of our continuing mission to provide you with exceptional heart care, we have created designated Provider Care Teams.  These Care Teams include your primary Cardiologist (physician) and Advanced Practice Providers (APPs -  Physician Assistants and Nurse Practitioners) who all work together to provide you with the care you need, when you need it.  We recommend signing up for the patient portal called "MyChart".  Sign up information is provided on this After Visit Summary.   MyChart is used to connect with patients for Virtual Visits (Telemedicine).  Patients are able to view lab/test results, encounter notes, upcoming appointments, etc.  Non-urgent messages can be sent to your provider as well.   To learn more about what you can do with MyChart, go to [email or web address].    Your next appointment:   Dr. Ellyn Hack 10/09/21  The format for your next appointment:   In Person  Provider:   Glenetta Hew, MD{   Other Instructions Call if your symptoms do not improve and we will schedule you for cardiac cath  Important Information About [place]           Glenetta Hew, M.D., M.S. Interventional Cardiologist   Pager # [phone] Phone # [phone] [address] [place], [place] [address]   Thank you for choosing Heartcare in [place]!!

## 2021-09-11 NOTE — Assessment & Plan Note (Signed)
Combination of typical and atypical features-evaluated with Coronary CTA that is now read as positive with likely occlusive RCA lesion.  Recommendation is invasive evaluation with cardiac catheterization. 2D echo pending.  Plan:  She would like to give a trial of medical management.  Start Toprol 25 mg every afternoon along with Imdur 30 mg every morning.  She is already on 10 mg of amlodipine for antianginal benefit.  Continue losartan and atorvastatin.  We will delay scheduling cardiac catheterization till I see her back on June 29.  This will help time for [place] to see the results of her echocardiogram but also her bone scan and CT abdomen pelvis with bone scan.  Once she has all these test results she will build to make a better decision about invasive procedures or not.  This also gives [place] time to see the results of the echocardiogram which could alter the decision making.

## 2021-09-12 ENCOUNTER — Encounter: Payer: Self-pay | Admitting: Cardiology

## 2021-09-13 ENCOUNTER — Other Ambulatory Visit: Payer: Self-pay | Admitting: Nurse Practitioner

## 2021-09-13 ENCOUNTER — Encounter: Payer: Self-pay | Admitting: Cardiology

## 2021-09-13 NOTE — Progress Notes (Incomplete)
Primary Care Provider: Letta Median, MD [facility] Cardiologist: None Electrophysiologist: None Vasc SGx: Dr. Leotis Pain; Dr. Delana Meyer  Clinic Note: Chief Complaint  Patient presents with  . Follow-up    Test results-Coronary CTA   ===================================  ASSESSMENT/PLAN   Problem List Items Addressed This Visit       Cardiology Problems   Essential hypertension (Chronic)    Victoria Steele is on ARB and HCTZ along with amlodipine.  Not currently on beta-blocker-with evidence of CAD, will start low-dose beta-blocker (Toprol 25 mg daily).  We will also add Imdur 30 mg daily..       Relevant Medications   metoprolol succinate (TOPROL XL) 25 MG 24 hr tablet   isosorbide mononitrate (IMDUR) 30 MG 24 hr tablet   Other Relevant Orders   EKG 12-Lead   DVT (deep venous thrombosis) (HCC)    Remains on Eliquis.  This would need to be held at least 24 hours Be precath.  Victoria Steele would need to be on double therapy for short period of time post catheter PCI indicated.       Relevant Medications   metoprolol succinate (TOPROL XL) 25 MG 24 hr tablet   isosorbide mononitrate (IMDUR) 30 MG 24 hr tablet   PAD (peripheral artery disease) (HCC) (Chronic)    Monitored by Dr. Lucky Cowboy.  Stable ABIs. Roughly 4 years out from right lower extremity revascularization.  Continuing cardiovascular risk factor modification.       Relevant Medications   metoprolol succinate (TOPROL XL) 25 MG 24 hr tablet   isosorbide mononitrate (IMDUR) 30 MG 24 hr tablet   Other Relevant Orders   EKG 12-Lead     Other   Abnormal cardiac CT angiography - Primary    Coronary CTA suggests significant RCA disease.  Potentially the culprit for exertional symptoms.  Recommendation is cardiac catheterization: Victoria Steele would like to wait the results of her echocardiogram, bone scan and CT scans etc. they are coming up in a couple weeks.  Plan will be to optimize medical therapy with adding beta-blocker and Imdur.   We will then reassess her symptoms on June 29 which is a result appointment. I did admonished her that if Victoria Steele were to have more symptoms that Victoria Steele had a day, and it did not go away, last longer or happen more frequently, we should probably not wait that long to proceed.  We have already discussed the procedure in detail as noted below.  If necessary, Victoria Steele would agree to proceed.  Shared Decision Making/Informed Consent The risks [stroke (1 in 1000), death (1 in 1000), kidney failure [usually temporary] (1 in 500), bleeding (1 in 200), allergic reaction [possibly serious] (1 in 200)], benefits (diagnostic support and management of coronary artery disease) and alternatives of a cardiac catheterization were discussed in detail with Victoria Steele and Victoria Steele is willing to proceed.       Relevant Orders   EKG 12-Lead   DOE (dyspnea on exertion) (Chronic)    2D echo pending -> plan to evaluate for possible cardiomyopathy related to cancer therapy.  Abnormal Coronary CTA -> recommendations cardiac catheterization.       Relevant Orders   EKG 12-Lead   Precordial pain (Chronic)    Combination of typical and atypical features-evaluated with Coronary CTA that is now read as positive with likely occlusive RCA lesion.  Recommendation is invasive evaluation with cardiac catheterization. 2D echo pending.  Plan: Victoria Steele would like to give a trial of medical management. Start Toprol  25 mg every afternoon along with Imdur 30 mg every morning. Victoria Steele is already on 10 mg of amlodipine for antianginal benefit. Continue losartan and atorvastatin. We will delay scheduling cardiac catheterization till I see her back on June 29.  This will help time for [place] to see the results of her echocardiogram but also her bone scan and CT abdomen pelvis with bone scan.  Once Victoria Steele has all these test results Victoria Steele will build to make a better decision about invasive procedures or not.  This also gives [place] time to see the results of the  echocardiogram which could alter the decision making.      Relevant Orders   EKG 12-Lead   Pre-procedure lab exam   Relevant Orders   EKG 12-Lead    ===================================  HPI:    Victoria Steele is a 81 y.o. female with h/o PAD, HTN, Br Ca & prior PE who is being seen today for Follow-Up Evaluation of ATYPICAL CHEST PAIN-now with Abnormal Coronary CTA.  Victoria Steele returns today at the request of Letta Median, MD.  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 Extensive RLE DVT April 2019-venous thrombectomy and thrombolytic therapy with IVC placement by Dr. Lucky Cowboy, on long-term Eliquis PAD-peripheral stenting.-RLE (2019) -> most recent ABIs: Right 0.93, left 0.72-, stable  CATHYRN DEAS was seen in initial consultation on Aug 28, 2021 for chest pain with light activity resolving at rest.  Pain described as a squeezing tightness starting in the left arm and shoulder radiate across the chest.  Symptoms can last few minutes.  Get better with resting.  Also noted having some exertional dyspnea, but acknowledges being sedentary since starting breast cancer treatment.  No CHF symptoms or prolonged arrhythmia symptoms-intermittent fluttering lasting a few seconds.  Recent Hospitalizations: None  Reviewed  CV studies:    The following studies were reviewed today: (if available, images/films reviewed: From Epic Chart or Care Everywhere) Coronary CTA 09/01/2021: Coronary Calcium Score 788.  Combination of calcified and noncalcified plaque-severe lesion in mid RCA (estimated greater than 70%).  Moderate LAD and LCx 50 to 69%). => CAD RADS 4 with severe stenosis suggested in the RCA.  FFRCT not submitted due to motion artifact.-Recommended cardiac catheterization. 2D echo pending   Interval History:   Victoria Steele  presents here today for follow-up to discuss results of her Coronary CTA stating that Victoria Steele has been pretty stable although relatively sedentary since her last visit.  Victoria Steele is accompanied by her husband today who says that he does think that Victoria Steele has been slowing down over the last several months if not years to the point where Victoria Steele does not do a lot.  Victoria Steele says that Victoria Steele did her chores this morning and increase her housework and some dishes and laundry.  After completing her chores, Victoria Steele sat down to catch her breath.  At that time Victoria Steele started noticing some tightness and squeezing her chest that lasted about 5 minutes or so.  Victoria Steele took some deep breaths and eventually the pain subsided.  Victoria Steele had a couple more episodes like that in the preceding weeks or 2.  Victoria Steele had 1 spell 2 days after the Coronary CTA that was maybe a little bit more pronounced.  Interestingly, Victoria Steele may notice some exertional dyspnea and fatigue, has not noticed the chest comfort with exertion, it  is usually after.  No real PND, or orthopnea but does have some trivial edema.  Off-and-on short fluttering sensations of maybe 2 or 3 beats that are rare.  No prolonged arrhythmia sensations.  No syncope/near syncope, or TIA or amaurosis fugax, no claudication.  REVIEWED OF SYSTEMS   Review of Systems  Constitutional:  Positive for malaise/fatigue (Related to all of her therapies for her metastatic cancer.  Does not have to get up and go that Victoria Steele once had). Negative for weight loss.  HENT:  Negative for congestion and nosebleeds.   Respiratory:  Positive for shortness of breath (On exertion; notes being deconditioned.). Negative for cough and wheezing.   Cardiovascular:  Negative for orthopnea, leg swelling and PND.       Per HPI.  Gastrointestinal:  Positive for nausea (Occasionally with cancer treatments.). Negative for blood in stool and melena.  Genitourinary:  Negative for dysuria, flank pain and hematuria.  Musculoskeletal:  Positive for  joint pain. Negative for back pain and myalgias.  Neurological:  Positive for dizziness and headaches. Negative for focal weakness and weakness.  Psychiatric/Behavioral:  Negative for depression and memory loss. The patient is nervous/anxious. The patient does not have insomnia.    I have reviewed and (if needed) personally updated the patient's problem list, medications, allergies, past medical and surgical history, social and family history.   PAST MEDICAL HISTORY   Past Medical History:  Diagnosis Date  . Breast cancer ([place])   . Cancer [facility])    left breast  . Coronary artery disease 08/30/2021   Coronary Calcium Score 788.  Combination of calcified and noncalcified plaque-severe lesion in mid RCA (estimated greater than 70%).  Moderate LAD and LCx 50 to 69%). => CAD RADS 4 with severe stenosis suggested in the RCA.  FFRCT not submitted due to motion artifact.-Recommended cardiac catheterization.  . DVT (deep venous thrombosis) ([place])    2019  . Hypertension   . Port catheter in place 09/14/2016   Placed 09/10/2016 RT chest.   DVT 2019: Status post thrombectomy about 4 years ago.  No significant pain or swelling post procedure.    Victoria Steele underwent right lower extremity revascularization about 4 years ago.  Victoria Steele is doing well today without any specific complaints.  No disabling claudication symptoms, ischemic rest pain, or ulceration. ABIs today are 0.92 on the right and 0.72 on the left with digit pressures of over 100 bilaterally and biphasic waveforms.  PAST SURGICAL HISTORY   Past Surgical History:  Procedure Laterality Date  . ABDOMINAL HYSTERECTOMY    . APPENDECTOMY    . BREAST BIOPSY    . CT CTA CORONARY W/CA SCORE W/CM &/OR WO/CM  09/01/2021   Coronary Calcium Score 788.  Combination of calcified and noncalcified plaque-severe lesion in mid RCA (estimated greater than 70%).  Moderate LAD and LCx 50 to 69%). => CAD RADS 4 with severe stenosis suggested in the RCA.  FFRCT not  submitted due to motion artifact.-Recommended cardiac catheterization.  Marland Kitchen CYST EXCISION Right 09/2016   Buttocks  . IVC FILTER REMOVAL N/A 01/17/2018   Procedure: IVC FILTER REMOVAL;  Surgeon: Algernon Huxley, MD;  Location: [place] [facility];  Service: Cardiovascular;  Laterality: N/A;  . LOWER EXTREMITY ANGIOGRAPHY Right 11/11/2017   Procedure: LOWER EXTREMITY ANGIOGRAPHY;  Surgeon: Katha Cabal, MD;  Location: [place] [facility];  Service: Cardiovascular;  Laterality: Right;  . PERIPHERAL VASCULAR THROMBECTOMY Right 08/30/2017   Procedure: PERIPHERAL VASCULAR THROMBECTOMY;  Surgeon: Leotis Pain  S, MD;  Location: [place] [facility];  Service: Cardiovascular;  Laterality: Right;  . PORTACATH PLACEMENT Right 09/10/2016   Procedure: INSERTION PORT-A-CATH;  Surgeon: Jules Husbands, MD;  Location: [facility];  Service: General;  Laterality: Right;    Immunization History  Administered Date(s) Administered  . Influenza, High Dose Seasonal PF 04/04/2018  . Influenza-Unspecified 04/03/2019, 02/27/2020  . PFIZER(Purple Top)SARS-COV-2 Vaccination 06/16/2019, 07/06/2019, 02/12/2020  . Pneumococcal Polysaccharide-23 04/03/2019  . Tdap 04/03/2019    MEDICATIONS/ALLERGIES   Current Meds  Medication Sig  . acetaminophen (TYLENOL) 500 MG tablet Take 500 mg by mouth daily as needed for moderate pain or headache.   Marland Kitchen amLODipine (NORVASC) 10 MG tablet Take 10 mg by mouth daily.  Marland Kitchen apixaban (ELIQUIS) 5 MG TABS tablet Take 1 tablet (5 mg total) by mouth 2 (two) times daily.  Marland Kitchen atorvastatin (LIPITOR) 40 MG tablet Take 40 mg by mouth at bedtime.  . Calcium Carb-Cholecalciferol (CALCIUM 600/VITAMIN D3 PO) Take 1 tablet by mouth daily.  . hydrochlorothiazide (HYDRODIURIL) 12.5 MG tablet Take 12.5 mg by mouth daily.  Marland Kitchen ibuprofen (ADVIL,MOTRIN) 400 MG tablet Take 400 mg by mouth 3 (three) times daily as needed.   . isosorbide mononitrate (IMDUR) 30 MG 24 hr tablet Take 1 tablet (30 mg total) by mouth  daily.  Marland Kitchen letrozole (FEMARA) 2.5 MG tablet Take 1 tablet (2.5 mg total) by mouth daily.  Marland Kitchen lidocaine-prilocaine (EMLA) cream Apply 1 application. topically as needed (port access).  Marland Kitchen losartan (COZAAR) 50 MG tablet Take 1 tablet by mouth once a day  for high blood pressure- to replace 100 mg  . metoprolol succinate (TOPROL XL) 25 MG 24 hr tablet Take 1 tablet (25 mg total) by mouth daily.  . palbociclib (IBRANCE) 75 MG tablet Take 1 tablet (75 mg total) by mouth daily. Take for 21 days on, 7 days off, repeat every 28 days.  Marland Kitchen PREVIDENT 5000 BOOSTER PLUS 1.1 % PSTE Place 1 application onto teeth 2 (two) times daily.   . prochlorperazine (COMPAZINE) 10 MG tablet TAKE 1 TABLET BY MOUTH EVERY 6 HOURS AS NEEDED FOR NAUSEA AND VOMITING  . [DISCONTINUED] metoprolol tartrate (LOPRESSOR) 100 MG tablet Take 1 tablet (100 mg) by mouth 2 hours prior to your Cardiac CT    Allergies  Allergen Reactions  . Codeine Rash    hallucinations    SOCIAL HISTORY/FAMILY HISTORY   Reviewed in Epic:   Social History   Tobacco Use  . Smoking status: Never  . Smokeless tobacco: Never  Vaping Use  . Vaping Use: Never used  Substance Use Topics  . Alcohol use: No  . Drug use: No   Social History   Social History Narrative  . Not on file   Family History  Problem Relation Age of Onset  . Parkinson's disease Mother   . Heart disease Father   . Bladder Cancer Father   . Lung cancer Father   . Heart disease Maternal Grandfather     OBJCTIVE -PE, EKG, labs   Wt Readings from Last 3 Encounters:  09/11/21 200 lb 12.8 oz (91.1 kg)  08/28/21 198 lb 4 oz (89.9 kg)  07/09/21 200 lb 6.4 oz (90.9 kg)    Physical Exam: BP 134/60   Pulse 89   Ht 5' 2" (1.575 m)   Wt 200 lb 12.8 oz (91.1 kg)   SpO2 97%   BMI 36.73 kg/m  Physical Exam Vitals reviewed.  Constitutional:      General: Victoria Steele  is not in acute distress.    Appearance: Normal appearance. Victoria Steele is obese. Victoria Steele is not ill-appearing or  toxic-appearing.  HENT:     Head: Normocephalic and atraumatic.  Neck:     Vascular: No carotid bruit.  Cardiovascular:     Rate and Rhythm: Normal rate and regular rhythm.     Pulses: Normal pulses.     Heart sounds: Normal heart sounds. No murmur heard.   No friction rub. No gallop.  Pulmonary:     Effort: Pulmonary effort is normal. No respiratory distress.     Breath sounds: Normal breath sounds. No wheezing or rales.  Chest:     Chest wall: No tenderness (Mild tenderness but this is not symptomatic Victoria Steele notes).  Abdominal:     General: Abdomen is flat. Bowel sounds are normal. There is no distension.     Palpations: Abdomen is soft.     Tenderness: There is no abdominal tenderness.     Comments: No HSM or bruit  Musculoskeletal:        General: Swelling (Mild bilateral puffy swelling no real edema.) present.     Cervical back: Normal range of motion and neck supple.  Skin:    General: Skin is warm and dry.  Neurological:     General: No focal deficit present.     Mental Status: Victoria Steele is alert and oriented to person, place, and time.     Gait: Gait normal.  Psychiatric:        Mood and Affect: Mood normal.        Behavior: Behavior normal.        Thought Content: Thought content normal.        Judgment: Judgment normal.     Comments: Pleasant mood and affect.    Adult ECG Report Not checked  Recent Labs: Reviewed No results found for: CHOL, HDL, LDLCALC, LDLDIRECT, TRIG, CHOLHDL Lab Results  Component Value Date   CREATININE 1.18 (H) 08/28/2021   BUN 15 08/28/2021   NA 140 08/28/2021   K 3.6 08/28/2021   CL 103 08/28/2021   CO2 22 08/28/2021      Latest Ref Rng & Units 07/09/2021    1:39 PM 04/09/2021    9:32 AM 12/24/2020    9:45 AM  CBC  WBC 4.0 - 10.5 K/uL 2.6   2.6   2.5    Hemoglobin 12.0 - 15.0 g/dL 11.9   12.1   12.0    Hematocrit 36.0 - 46.0 % 33.9   34.4   34.0    Platelets 150 - 400 K/uL 163   224   145      Lab Results  Component Value Date    HGBA1C 5.3 09/02/2017   Lab Results  Component Value Date   TSH 3.485 09/02/2017    ==================================================  COVID-19 Education: The signs and symptoms of COVID-19 were discussed with the patient and how to seek care for testing (follow up with PCP or arrange E-visit).    I spent a total of 31 minutes with the patient spent in direct patient consultation.  Additional time spent with chart review  / charting (stud today [place] 10 down to a day and ies, outside notes, etc): 21 min Total Time: 52 min  Current medicines are reviewed at length with the patient today.  (+/- concerns) NA  This visit occurred during the SARS-CoV-2 public health emergency.  Safety protocols were in place, including screening questions prior to the visit, additional usage  of staff PPE, and extensive cleaning of exam room while observing appropriate contact time as indicated for disinfecting solutions.  Notice: This dictation was prepared with Dragon dictation along with smart phrase technology. Any transcriptional errors that result from this process are unintentional and may not be corrected upon review.   Studies Ordered:  Orders Placed This Encounter  Procedures  . EKG 12-Lead   Meds ordered this encounter  Medications  . metoprolol succinate (TOPROL XL) 25 MG 24 hr tablet    Sig: Take 1 tablet (25 mg total) by mouth daily.    Dispense:  30 tablet    Refill:  6  . isosorbide mononitrate (IMDUR) 30 MG 24 hr tablet    Sig: Take 1 tablet (30 mg total) by mouth daily.    Dispense:  30 tablet    Refill:  6    Patient Instructions / Medication Changes & Studies & Tests Ordered   Patient Instructions  Medication Instructions:  START Imdur 30 mg every morning - take 1/2 tablet for the first 2 days START Toprol XL 25 mg every night   *If you need a refill on your cardiac medications before your next appointment, please call your pharmacy*   Lab Work: None If you have  labs (blood work) drawn today and your tests are completely normal, you will receive your results only by: MyChart Message (if you have MyChart) OR A paper copy in the mail If you have any lab test that is abnormal or we need to change your treatment, we will call you to review the results.   Testing/Procedures: Echocardiogram as scheduled on 10/01/21   Follow-Up: At [facility], you and your health needs are our priority.  As part of our continuing mission to provide you with exceptional heart care, we have created designated Provider Care Teams.  These Care Teams include your primary Cardiologist (physician) and Advanced Practice Providers (APPs -  Physician Assistants and Nurse Practitioners) who all work together to provide you with the care you need, when you need it.  We recommend signing up for the patient portal called "MyChart".  Sign up information is provided on this After Visit Summary.  MyChart is used to connect with patients for Virtual Visits (Telemedicine).  Patients are able to view lab/test results, encounter notes, upcoming appointments, etc.  Non-urgent messages can be sent to your provider as well.   To learn more about what you can do with MyChart, go to [email or web address].    Your next appointment:   Dr. Ellyn Hack 10/09/21  The format for your next appointment:   In Person  Provider:   Glenetta Hew, MD{   Other Instructions Call if your symptoms do not improve and we will schedule you for cardiac cath  Important Information About [place]           Glenetta Hew, M.D., M.S. Interventional Cardiologist   Pager # [phone] Phone # [phone] [address] [place], [place] [address]   Thank you for choosing [facility] [place]!!

## 2021-09-13 NOTE — Assessment & Plan Note (Signed)
Remains on Eliquis.  This would need to be held at least 24 hours Be precath.  She would need to be on double therapy for short period of time post catheter PCI indicated.

## 2021-09-26 ENCOUNTER — Other Ambulatory Visit (HOSPITAL_COMMUNITY): Payer: Self-pay

## 2021-10-01 ENCOUNTER — Ambulatory Visit (INDEPENDENT_AMBULATORY_CARE_PROVIDER_SITE_OTHER): Payer: Medicare Other

## 2021-10-01 DIAGNOSIS — R0609 Other forms of dyspnea: Secondary | ICD-10-CM

## 2021-10-01 LAB — ECHOCARDIOGRAM COMPLETE
AR max vel: 3.19 cm2
AV Area VTI: 3.22 cm2
AV Area mean vel: 2.94 cm2
AV Mean grad: 6.5 mmHg
AV Peak grad: 12 mmHg
Ao pk vel: 1.74 m/s
Area-P 1/2: 2.58 cm2
S' Lateral: 2.8 cm

## 2021-10-01 MED ORDER — PERFLUTREN LIPID MICROSPHERE
1.0000 mL | INTRAVENOUS | Status: AC | PRN
  Administered 2021-10-01: 2 mL via INTRAVENOUS

## 2021-10-02 ENCOUNTER — Other Ambulatory Visit (HOSPITAL_COMMUNITY): Payer: Self-pay

## 2021-10-02 NOTE — Progress Notes (Signed)
Requested PCP notes, labs and EKG.  

## 2021-10-06 ENCOUNTER — Ambulatory Visit
Admission: RE | Admit: 2021-10-06 | Discharge: 2021-10-06 | Disposition: A | Discharge status: Home / Self Care | Payer: Medicare Other | Source: Ambulatory Visit | Attending: Oncology | Admitting: Oncology

## 2021-10-06 ENCOUNTER — Encounter
Admission: RE | Admit: 2021-10-06 | Discharge: 2021-10-06 | Disposition: A | Discharge status: Home / Self Care | Payer: Medicare Other | Source: Ambulatory Visit | Attending: Oncology | Admitting: Oncology

## 2021-10-06 DIAGNOSIS — C50912 Malignant neoplasm of unspecified site of left female breast: Secondary | ICD-10-CM

## 2021-10-06 DIAGNOSIS — C7951 Secondary malignant neoplasm of bone: Secondary | ICD-10-CM | POA: Diagnosis present

## 2021-10-06 LAB — POCT I-STAT CREATININE: Creatinine, Ser: 0.9 mg/dL (ref 0.44–1.00)

## 2021-10-06 MED ORDER — IOHEXOL 350 MG/ML SOLN
80.0000 mL | Freq: Once | INTRAVENOUS | Status: AC | PRN
  Administered 2021-10-06: 80 mL via INTRAVENOUS

## 2021-10-06 MED ORDER — TECHNETIUM TC 99M MEDRONATE IV KIT
20.0000 | PACK | Freq: Once | INTRAVENOUS | Status: AC | PRN
  Administered 2021-10-06: 22.87 via INTRAVENOUS

## 2021-10-09 ENCOUNTER — Encounter: Payer: Self-pay | Admitting: Cardiology

## 2021-10-09 ENCOUNTER — Ambulatory Visit: Payer: Medicare Other | Admitting: Cardiology

## 2021-10-09 VITALS — BP 144/50 | HR 65 | Ht 62.0 in | Wt 200.1 lb

## 2021-10-09 DIAGNOSIS — I10 Essential (primary) hypertension: Secondary | ICD-10-CM

## 2021-10-09 DIAGNOSIS — R931 Abnormal findings on diagnostic imaging of heart and coronary circulation: Secondary | ICD-10-CM

## 2021-10-09 DIAGNOSIS — I209 Angina pectoris, unspecified: Secondary | ICD-10-CM | POA: Diagnosis not present

## 2021-10-09 DIAGNOSIS — I739 Peripheral vascular disease, unspecified: Secondary | ICD-10-CM

## 2021-10-09 DIAGNOSIS — R0609 Other forms of dyspnea: Secondary | ICD-10-CM

## 2021-10-09 MED ORDER — SODIUM CHLORIDE 0.9% FLUSH: 3.0000 mL | Freq: Two times a day (BID) | INTRAVENOUS | Status: DC

## 2021-10-09 NOTE — Assessment & Plan Note (Signed)
Followed by vascular surgery.  Continue cardiovascular risk factor modification with statin aspirin beta-blocker nitrate ARB etc.

## 2021-10-09 NOTE — Assessment & Plan Note (Signed)
Coronary CTA suggest significant RCA disease as well as moderate disease in the LAD LCx.  At this point with ongoing resting and exertional chest pain along with exertional dyspnea, we need a definitive answer with cardiac catheterization in order to properly treat.  She is a not on aspirin because of Eliquis.  She is on statin nitrate, beta-blocker, ARB and calcium channel blocker.  Plan: Cardiac catheterization on July the, labs to be checked as part of her standard oncology labs.

## 2021-10-09 NOTE — Patient Instructions (Signed)
Medication Instructions:   Your physician recommends that you continue on your current medications as directed. Please refer to the Current Medication list given to you today.   *If you need a refill on your cardiac medications before your next appointment, please call your pharmacy*   Lab Work:  CBC and BMP to be drawn at oncology office on 6/30   If you have labs (blood work) drawn today and your tests are completely normal, you will receive your results only by: [place] (if you have MyChart) OR A paper copy in the mail If you have any lab test that is abnormal or we need to change your treatment, we will call you to review the results.   Testing/Procedures:   You are scheduled for a Cardiac Catheterization on Thursday, July 6 with Dr. Glenetta Hew.  1. Please arrive at the [place] at [facility], [place], [place] [address] at 11:30 AM (This time is one hour before your procedure to ensure your preparation). Free valet parking service is available.   Special note: Every effort is made to have your procedure done on time. Please understand that emergencies sometimes delay scheduled procedures.  2. Diet: Do not eat solid foods after midnight.  You may have clear liquids until 5 AM upon the day of the procedure.  3. Labs: You will need to have blood drawn on Friday, June 30 at Oncology Office. You do not need to be fasting.  4. Medication instructions in preparation for your procedure:   Contrast Allergy: No  Stop taking Eliquis (Apixiban) on Monday, July 3.  Stop taking, HTCZ (Hydrochlorothiazide) Wednesday, July 5,   On the morning of your procedure, take Aspirin and any morning medicines NOT listed above.  You may use sips of water.  5. Plan to go home the same day, you will only stay overnight if medically necessary. 6. You MUST have a responsible adult to drive you home. 7. An adult MUST be with you the first 24 hours  after you arrive home. 8. Bring a current list of your medications, and the last time and date medication taken. 9. Bring ID and current insurance cards. 10.Please wear clothes that are easy to get on and off and wear slip-on shoes.  Thank you for allowing [place] to care for you!   -- [facility]    Follow-Up: At [facility], you and your health needs are our priority.  As part of our continuing mission to provide you with exceptional heart care, we have created designated Provider Care Teams.  These Care Teams include your primary Cardiologist (physician) and Advanced Practice Providers (APPs -  Physician Assistants and Nurse Practitioners) who all work together to provide you with the care you need, when you need it.  We recommend signing up for the patient portal called "MyChart".  Sign up information is provided on this After Visit Summary.  MyChart is used to connect with patients for Virtual Visits (Telemedicine).  Patients are able to view lab/test results, encounter notes, upcoming appointments, etc.  Non-urgent messages can be sent to your provider as well.   To learn more about what you can do with MyChart, go to [email or web address].    Your next appointment:   5 week(s)  The format for your next appointment:   In Person  Provider:   You may see Glenetta Hew, MD or one of the following Advanced Practice Providers on your designated Care  Team:   Murray Hodgkins, NP Christell Faith, PA-C Cadence Kathlen Mody, [place]   Other Instructions N/A  Important Information About Sugar

## 2021-10-09 NOTE — Assessment & Plan Note (Signed)
Blood pressure still borderline elevated on current dose of HCTZ, losartan now Toprol and amlodipine.  Since she intermittently has dizziness associated with her cancer treatments, I am reluctant to pressure medications further at this point.  Tolerating current meds.

## 2021-10-09 NOTE — Progress Notes (Signed)
Primary Care Provider: Oswaldo Conroy, MD [facility] Cardiologist: None Electrophysiologist: None Vasc SGx: Dr. Festus Barren; Dr. Gilda Crease  Clinic Note: Chief Complaint  Patient presents with   Follow-up    Discuss CTA & Echo results. Patient c/o chest pain and shortness of breath. Medications reviewed by the patient verbally.     ===================================  ASSESSMENT/PLAN   Problem List Items Addressed This Visit       Cardiology Problems   Angina, class III (HCC) - Primary    She does have some typical and otherwise atypical features with her chest pain.  I am a little bit leery of what she is feeling now there was some reproducibility on exam.  But she is complaining of chest pain occurring somewhat at rest but also with exertion and we have an abnormal CT scan.  I really think the only option is to confirm or deny presence of severe disease with cardiac catheterization and possible PCI.  We discussed this in great detail and she has essentially not had any benefit from medications...   Plan: Scheduled for cardiac catheterization next week on July 6.  (Informed decision-making consent noted below) Continue amlodipine 10 mg daily along with Imdur 30 mg and Toprol 25 mg. She has seen her oncologist tomorrow to discuss results of her CT abdomen/pelvis and bone scan.  She can have labs drawn at that visit.      Relevant Medications   sodium chloride flush (NS) 0.9 % injection 3 mL (Start on 10/09/2021 11:30 PM)   Essential hypertension (Chronic)    Blood pressure still borderline elevated on current dose of HCTZ, losartan now Toprol and amlodipine.  Since she intermittently has dizziness associated with her cancer treatments, I am reluctant to pressure medications further at this point.  Tolerating current meds.      Relevant Medications   sodium chloride flush (NS) 0.9 % injection 3 mL (Start on 10/09/2021 11:30 PM)   PAD (peripheral artery disease) (HCC)  (Chronic)    Followed by vascular surgery.  Continue cardiovascular risk factor modification with statin aspirin beta-blocker nitrate ARB etc.      Relevant Medications   sodium chloride flush (NS) 0.9 % injection 3 mL (Start on 10/09/2021 11:30 PM)     Other   Abnormal cardiac CT angiography    Coronary CTA suggest significant RCA disease as well as moderate disease in the LAD LCx.  At this point with ongoing resting and exertional chest pain along with exertional dyspnea, we need a definitive answer with cardiac catheterization in order to properly treat.  She is a not on aspirin because of Eliquis.  She is on statin nitrate, beta-blocker, ARB and calcium channel blocker.  Plan: Cardiac catheterization on July the, labs to be checked as part of her standard oncology labs.      Relevant Medications   sodium chloride flush (NS) 0.9 % injection 3 mL (Start on 10/09/2021 11:30 PM)   DOE (dyspnea on exertion) (Chronic)    Echo done relatively normal.  Less likely cardiomyopathy related, this increases the likelihood of there being an ischemic component.  Plan: Cardiac catheterization      Relevant Medications   sodium chloride flush (NS) 0.9 % injection 3 mL (Start on 10/09/2021 11:30 PM)   ===================================  HPI:    Victoria Steele is a 81 y.o. female with h/o PAD, HTN, Br Ca & prior PE who is being seen today for Follow-Up Evaluation of ATYPICAL CHEST PAIN-now  with Abnormal Coronary CTA.  She returns today for 4-week follow-up to discuss symptoms of medical therapy at the request of Bender, Durene Cal, MD.  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 Extensive RLE DVT April 2019-venous thrombectomy and thrombolytic therapy with IVC placement by Dr. Lucky Cowboy, on long-term Eliquis PAD-peripheral  stenting.-RLE (2019) -> most recent ABIs: Right 0.93, left 0.72-, stable  Initial Cardiology Consult 08/28/2021 w/ c/o CP with light activity & relieved @ rest. Pain described as a squeezing tightness starting in the left arm and shoulder radiate across the chest.  Symptoms can last few minutes.  Get better with resting.  Also noted having some exertional dyspnea, but acknowledges being sedentary since starting breast cancer treatment.  No CHF symptoms or prolonged arrhythmia symptoms-intermittent fluttering lasting a few seconds.=> Evaluated with Coronary CT Angiogram.  Victoria Steele was seen in follow-up on September 11, 2021 to discuss results of her CT scan with concerning findings of possible Severe mRCA disease as well as intermediate disease in LAD & LCx. We then talked about proceeding with cardiac catheterization --> started on Metoprolol & Imdur with plan for close f/u to reassess symptoms. Echo ordered to assess potential Chemo related cardiomyopathy.   Coronary CTA 09/01/2021: Coronary Calcium Score 788.  Combination of calcified and noncalcified plaque-severe lesion in mid RCA (estimated greater than 70%).  Moderate LAD and LCx 50 to 69%). => CAD RADS 4 with severe stenosis suggested in the RCA.  FFRCT not submitted due to motion artifact.-Recommended cardiac catheterization.  Recent Hospitalizations: None  Reviewed  CV studies:    The following studies were reviewed today: (if available, images/films reviewed: From Epic Chart or Care Everywhere) 2D Echo 10/01/2021: EF 60 to 65%.  No RWMA.  Normal RV size and function.  Normal valves.   Interval History:   Victoria Steele presents here today for follow-up indicating that she is still having episodes of chest pain, sometimes now having it at rest but usually exertional, and relieved by rest today she actually shows a more focal location in the left upper chest that seems to be a little bit tender to palpation.  She is still very leery of whether  or not she wants to go to Cath Lab, but acknowledges that with now progressive symptoms that are potentially cardiac in nature, the only way to definitively evaluate this is with cardiac catheterization.  She really has not had any benefit with both Imdur and metoprolol.  She is already on amlodipine pretty significant antianginal treatment.  She also notes exertional dyspnea associated with the chest discomfort.  Seems to be more limited by the dyspnea and chest discomfort than any PAD related claudication. No rapid irregular beats palpitations.  Some mild fluttering off and on only.  No PND, orthopnea with trivial ankle swelling.  No syncope or near syncope, TIA/amaurosis fugax or claudication.  She does feel somewhat weak when she is getting her chemotherapy treatments. Fatigue associated with her cancer treatments but these come and go as treatments take place.   REVIEWED OF SYSTEMS   Review of Systems  Constitutional:  Positive for malaise/fatigue (Related to cancer treatments). Negative for weight loss.  HENT:  Negative for congestion and nosebleeds.   Respiratory:  Positive for shortness of breath (On exertion; notes being deconditioned.). Negative for cough and wheezing.   Cardiovascular:  Negative for orthopnea, leg swelling and PND.       Per HPI.  Gastrointestinal:  Positive for nausea (Associated with her cancer treatments). Negative for blood in stool and melena.  Genitourinary:  Negative for dysuria, flank pain and hematuria.  Musculoskeletal:  Positive for joint pain. Negative for back pain and falls.  Neurological:  Positive for dizziness and headaches. Negative for focal weakness and weakness.  Endo/Heme/Allergies:  Does not bruise/bleed easily.  Psychiatric/Behavioral:  Negative for depression and memory loss. The patient is nervous/anxious. The patient does not have insomnia.    I have reviewed and (if needed) personally updated the patient's problem list, medications,  allergies, past medical and surgical history, social and family history.   PAST MEDICAL HISTORY   Past Medical History:  Diagnosis Date   Breast cancer ([place])    Cancer ([place])    left breast   Coronary artery disease 08/30/2021   Coronary Calcium Score 788.  Combination of calcified and noncalcified plaque-severe lesion in mid RCA (estimated greater than 70%).  Moderate LAD and LCx 50 to 69%). => CAD RADS 4 with severe stenosis suggested in the RCA.  FFRCT not submitted due to motion artifact.-Recommended cardiac catheterization.   DVT (deep venous thrombosis) ([place])    2019   Hypertension    Port catheter in place 09/14/2016   Placed 09/10/2016 RT chest.   DVT 2019: Status post thrombectomy about 4 years ago.  No significant pain or swelling post procedure.    She underwent right lower extremity revascularization about 4 years ago.  She is doing well today without any specific complaints.  No disabling claudication symptoms, ischemic rest pain, or ulceration. ABIs today are 0.92 on the right and 0.72 on the left with digit pressures of over 100 bilaterally and biphasic waveforms.  PAST SURGICAL HISTORY   Past Surgical History:  Procedure Laterality Date   ABDOMINAL HYSTERECTOMY     APPENDECTOMY     BREAST BIOPSY     CT CTA CORONARY W/CA SCORE W/CM &/OR WO/CM  09/01/2021   Coronary Calcium Score 788.  Combination of calcified and noncalcified plaque-severe lesion in mid RCA (estimated greater than 70%).  Moderate LAD and LCx 50 to 69%). => CAD RADS 4 with severe stenosis suggested in the RCA.  FFRCT not submitted due to motion artifact.-Recommended cardiac catheterization.   CYST EXCISION Right 09/2016   Buttocks   IVC FILTER REMOVAL N/A 01/17/2018   Procedure: IVC FILTER REMOVAL;  Surgeon: Algernon Huxley, MD;  Location: [place] [facility];  Service: Cardiovascular;  Laterality: N/A;   LOWER EXTREMITY ANGIOGRAPHY Right 11/11/2017   Procedure: LOWER EXTREMITY ANGIOGRAPHY;  Surgeon:  Katha Cabal, MD;  Location: [facility] [facility];  Service: Cardiovascular;  Laterality: Right;   PERIPHERAL VASCULAR THROMBECTOMY Right 08/30/2017   Procedure: PERIPHERAL VASCULAR THROMBECTOMY;  Surgeon: Algernon Huxley, MD;  Location: [facility];  Service: Cardiovascular;  Laterality: Right;   PORTACATH PLACEMENT Right 09/10/2016   Procedure: INSERTION PORT-A-CATH;  Surgeon: Jules Husbands, MD;  Location: [facility];  Service: General;  Laterality: Right;    Immunization History  Administered Date(s) Administered   Influenza, High Dose Seasonal PF 04/04/2018   Influenza-Unspecified 04/03/2019, 02/27/2020   PFIZER(Purple Top)SARS-COV-2 Vaccination 06/16/2019, 07/06/2019, 02/12/2020   Pneumococcal Polysaccharide-23 04/03/2019   Tdap 04/03/2019    MEDICATIONS/ALLERGIES   Current Meds  Medication Sig   acetaminophen (TYLENOL) 500 MG tablet Take 500 mg by mouth daily as needed for moderate pain or  headache.    amLODipine (NORVASC) 10 MG tablet Take 10 mg by mouth daily.   apixaban (ELIQUIS) 5 MG TABS tablet Take 1 tablet (5 mg total) by mouth 2 (two) times daily.   atorvastatin (LIPITOR) 40 MG tablet Take 40 mg by mouth at bedtime.   Calcium Carb-Cholecalciferol (CALCIUM 600/VITAMIN D3 PO) Take 1 tablet by mouth daily.   hydrochlorothiazide (HYDRODIURIL) 12.5 MG tablet Take 12.5 mg by mouth daily.   ibuprofen (ADVIL,MOTRIN) 400 MG tablet Take 400 mg by mouth 3 (three) times daily as needed.    isosorbide mononitrate (IMDUR) 30 MG 24 hr tablet Take 1 tablet (30 mg total) by mouth daily.   letrozole (FEMARA) 2.5 MG tablet Take 1 tablet (2.5 mg total) by mouth daily.   lidocaine-prilocaine (EMLA) cream Apply 1 application. topically as needed (port access).   losartan (COZAAR) 50 MG tablet Take 1 tablet by mouth once a day  for high blood pressure- to replace 100 mg   metoprolol succinate (TOPROL XL) 25 MG 24 hr tablet Take 1 tablet (25 mg total) by mouth daily.   palbociclib  (IBRANCE) 75 MG tablet Take 1 tablet (75 mg total) by mouth daily. Take for 21 days on, 7 days off, repeat every 28 days.   prochlorperazine (COMPAZINE) 10 MG tablet TAKE 1 TABLET BY MOUTH EVERY 6 HOURS AS NEEDED FOR NAUSEA AND VOMITING   Current Facility-Administered Medications for the 10/09/21 encounter (Office Visit) with Leonie Man, MD  Medication   sodium chloride flush (NS) 0.9 % injection 3 mL    Allergies  Allergen Reactions   Codeine Rash    hallucinations    SOCIAL HISTORY/FAMILY HISTORY   Reviewed in Epic:   Social History   Tobacco Use   Smoking status: Never   Smokeless tobacco: Never  Vaping Use   Vaping Use: Never used  Substance Use Topics   Alcohol use: No   Drug use: No   Social History   Social History Narrative   Not on file   Family History  Problem Relation Age of Onset   Parkinson's disease Mother    Heart disease Father    Bladder Cancer Father    Lung cancer Father    Heart disease Maternal Grandfather     OBJCTIVE -PE, EKG, labs   Wt Readings from Last 3 Encounters:  10/09/21 200 lb 2 oz (90.8 kg)  09/11/21 200 lb 12.8 oz (91.1 kg)  08/28/21 198 lb 4 oz (89.9 kg)    Physical Exam: BP (!) 144/50 (BP Location: Right Arm, Patient Position: Sitting, Cuff Size: Large)   Pulse 65   Ht $R'5\' 2"'bi$  (1.575 m)   Wt 200 lb 2 oz (90.8 kg)   SpO2 97%   BMI 36.60 kg/m  Physical Exam Vitals reviewed.  Constitutional:      General: She is not in acute distress.    Appearance: Normal appearance. She is obese. She is not ill-appearing or toxic-appearing.     Comments: Well-nourished, well-groomed  HENT:     Head: Normocephalic and atraumatic.  Neck:     Vascular: No carotid bruit.  Cardiovascular:     Rate and Rhythm: Normal rate and regular rhythm.     Pulses: Normal pulses.     Heart sounds: Normal heart sounds. No murmur heard.    No friction rub. No gallop.  Pulmonary:     Effort: Pulmonary effort is normal. No respiratory  distress.     Breath sounds: Normal breath sounds.  No wheezing or rales.  Chest:     Chest wall: No tenderness (Mild tenderness but this is not symptomatic she notes).  Abdominal:     General: Abdomen is flat. Bowel sounds are normal. There is no distension.     Palpations: Abdomen is soft. There is no mass.     Tenderness: There is no abdominal tenderness. There is no guarding.     Comments: No HSM or bruit  Musculoskeletal:        General: Swelling (Trivial bilateral puffy swelling no real edema.) present.     Cervical back: Normal range of motion and neck supple.  Skin:    General: Skin is warm and dry.  Neurological:     General: No focal deficit present.     Mental Status: She is alert and oriented to person, place, and time.     Cranial Nerves: No cranial nerve deficit.     Gait: Gait normal.  Psychiatric:        Mood and Affect: Mood normal.        Behavior: Behavior normal.        Thought Content: Thought content normal.        Judgment: Judgment normal.     Adult ECG Report  Rate: 65;  Rhythm: normal sinus rhythm and RBBB with repolarization changes.  CRO IMI age-indeterminate...  Lateral ST T wave changes. ;   Narrative Interpretation:  Stable  Recent Labs: Reviewed No results found for: "CHOL", "HDL", "LDLCALC", "LDLDIRECT", "TRIG", "CHOLHDL" Lab Results  Component Value Date   CREATININE 0.90 10/06/2021   BUN 15 08/28/2021   NA 140 08/28/2021   K 3.6 08/28/2021   CL 103 08/28/2021   CO2 22 08/28/2021      Latest Ref Rng & Units 07/09/2021    1:39 PM 04/09/2021    9:32 AM 12/24/2020    9:45 AM  CBC  WBC 4.0 - 10.5 K/uL 2.6  2.6  2.5   Hemoglobin 12.0 - 15.0 g/dL 11.9  12.1  12.0   Hematocrit 36.0 - 46.0 % 33.9  34.4  34.0   Platelets 150 - 400 K/uL 163  224  145     Lab Results  Component Value Date   HGBA1C 5.3 09/02/2017   Lab Results  Component Value Date   TSH 3.485 09/02/2017    ==================================================  COVID-19  Education: The signs and symptoms of COVID-19 were discussed with the patient and how to seek care for testing (follow up with PCP or arrange E-visit).    I spent a total of 30  minutes with the patient spent in direct patient consultation.  Additional time spent with chart review  / charting (studies, outside notes, etc): 26 min Total Time: 56 min  Current medicines are reviewed at length with the patient today.  (+/- concerns) NA  This visit occurred during the SARS-CoV-2 public health emergency.  Safety protocols were in place, including screening questions prior to the visit, additional usage of staff PPE, and extensive cleaning of exam room while observing appropriate contact time as indicated for disinfecting solutions.  Notice: This dictation was prepared with Dragon dictation along with smart phrase technology. Any transcriptional errors that result from this process are unintentional and may not be corrected upon review.   Studies Ordered:  No orders of the defined types were placed in this encounter.  Meds ordered this encounter  Medications   sodium chloride flush (NS) 0.9 % injection 3 mL   Shared  Decision Making/Informed Consent The risks [stroke (1 in 1000), death (1 in 1000), kidney failure [usually temporary] (1 in 500), bleeding (1 in 200), allergic reaction [possibly serious] (1 in 200)], benefits (diagnostic support and management of coronary artery disease) and alternatives of a cardiac catheterization were discussed in detail with Victoria Steele and she is willing to proceed.   Patient Instructions / Medication Changes & Studies & Tests Ordered   Patient Instructions  Medication Instructions:   Your physician recommends that you continue on your current medications as directed. Please refer to the Current Medication list given to you today.   *If you need a refill on your cardiac medications before your next appointment, please call your pharmacy*   Lab Work:  CBC  and BMP to be drawn at oncology office on 6/30   If you have labs (blood work) drawn today and your tests are completely normal, you will receive your results only by: [place] (if you have MyChart) OR A paper copy in the mail If you have any lab test that is abnormal or we need to change your treatment, we will call you to review the results.   Testing/Procedures:   You are scheduled for a Cardiac Catheterization on Thursday, July 6 with Dr. Glenetta Hew.  1. Please arrive at the [facility] at [place], [place], [place] [address] at 11:30 AM (This time is one hour before your procedure to ensure your preparation). Free valet parking service is available.   Special note: Every effort is made to have your procedure done on time. Please understand that emergencies sometimes delay scheduled procedures.  2. Diet: Do not eat solid foods after midnight.  You may have clear liquids until 5 AM upon the day of the procedure.  3. Labs: You will need to have blood drawn on Friday, June 30 at Oncology Office. You do not need to be fasting.  4. Medication instructions in preparation for your procedure:   Contrast Allergy: No  Stop taking Eliquis (Apixiban) on Monday, July 3.  Stop taking, HTCZ (Hydrochlorothiazide) Wednesday, July 5,   On the morning of your procedure, take Aspirin and any morning medicines NOT listed above.  You may use sips of water.  5. Plan to go home the same day, you will only stay overnight if medically necessary. 6. You MUST have a responsible adult to drive you home. 7. An adult MUST be with you the first 24 hours after you arrive home. 8. Bring a current list of your medications, and the last time and date medication taken. 9. Bring ID and current insurance cards. 10.Please wear clothes that are easy to get on and off and wear slip-on shoes.  Thank you for allowing [place] to care for you!   -- [facility]    Follow-Up: At [facility], you and your health needs are our priority.  As part of our continuing mission to provide you with exceptional heart care, we have created designated Provider Care Teams.  These Care Teams include your primary Cardiologist (physician) and Advanced Practice Providers (APPs -  Physician Assistants and Nurse Practitioners) who all work together to provide you with the care you need, when you need it.  We recommend signing up for the patient portal called "MyChart".  Sign up information is provided on this After Visit Summary.  MyChart is used to connect with patients for Virtual Visits (Telemedicine).  Patients are able to  view lab/test results, encounter notes, upcoming appointments, etc.  Non-urgent messages can be sent to your provider as well.   To learn more about what you can do with MyChart, go to [email or web address].    Your next appointment:   5 week(s)  The format for your next appointment:   In Person  Provider:   You may see Glenetta Hew, MD or one of the following Advanced Practice Providers on your designated Care Team:   Murray Hodgkins, NP Christell Faith, PA-C Cadence Kathlen Mody, [place]   Other Instructions N/A  Important Information About [place]           Glenetta Hew, M.D., M.S. Interventional Cardiologist   Pager # [phone] Phone # [phone] [address] [place], [place] [address]   Thank you for choosing [facility] in [place]!!

## 2021-10-09 NOTE — Assessment & Plan Note (Signed)
Echo done relatively normal.  Less likely cardiomyopathy related, this increases the likelihood of there being an ischemic component.  Plan: Cardiac catheterization

## 2021-10-09 NOTE — Assessment & Plan Note (Signed)
She does have some typical and otherwise atypical features with her chest pain.  I am a little bit leery of what she is feeling now there was some reproducibility on exam.  But she is complaining of chest pain occurring somewhat at rest but also with exertion and we have an abnormal CT scan.  I really think the only option is to confirm or deny presence of severe disease with cardiac catheterization and possible PCI.  We discussed this in great detail and she has essentially not had any benefit from medications...   Plan: Scheduled for cardiac catheterization next week on July 6.  (Informed decision-making consent noted below)  Continue amlodipine 10 mg daily along with Imdur 30 mg and Toprol 25 mg.  She has seen her oncologist tomorrow to discuss results of her CT abdomen/pelvis and bone scan.  She can have labs drawn at that visit.

## 2021-10-10 ENCOUNTER — Inpatient Hospital Stay: Payer: Medicare Other | Admitting: Oncology

## 2021-10-10 ENCOUNTER — Inpatient Hospital Stay: Payer: Medicare Other

## 2021-10-10 ENCOUNTER — Encounter: Payer: Self-pay | Admitting: Oncology

## 2021-10-10 ENCOUNTER — Inpatient Hospital Stay: Payer: Medicare Other | Attending: Oncology

## 2021-10-10 VITALS — BP 165/76 | HR 68 | Resp 18 | Ht 62.0 in | Wt 199.5 lb

## 2021-10-10 DIAGNOSIS — Z79811 Long term (current) use of aromatase inhibitors: Secondary | ICD-10-CM

## 2021-10-10 DIAGNOSIS — C7951 Secondary malignant neoplasm of bone: Secondary | ICD-10-CM

## 2021-10-10 DIAGNOSIS — Z7983 Long term (current) use of bisphosphonates: Secondary | ICD-10-CM

## 2021-10-10 DIAGNOSIS — D702 Other drug-induced agranulocytosis: Secondary | ICD-10-CM

## 2021-10-10 DIAGNOSIS — Z5181 Encounter for therapeutic drug level monitoring: Secondary | ICD-10-CM | POA: Diagnosis not present

## 2021-10-10 DIAGNOSIS — Z79899 Other long term (current) drug therapy: Secondary | ICD-10-CM | POA: Insufficient documentation

## 2021-10-10 DIAGNOSIS — C50912 Malignant neoplasm of unspecified site of left female breast: Secondary | ICD-10-CM

## 2021-10-10 DIAGNOSIS — Z170 Estrogen receptor positive status [ER+]: Secondary | ICD-10-CM | POA: Diagnosis not present

## 2021-10-10 LAB — CBC WITH DIFFERENTIAL/PLATELET
Abs Immature Granulocytes: 0.01 10*3/uL (ref 0.00–0.07)
Basophils Absolute: 0.1 10*3/uL (ref 0.0–0.1)
Basophils Relative: 2 %
Eosinophils Absolute: 0 10*3/uL (ref 0.0–0.5)
Eosinophils Relative: 2 %
HCT: 33.5 % — ABNORMAL LOW (ref 36.0–46.0)
Hemoglobin: 11.9 g/dL — ABNORMAL LOW (ref 12.0–15.0)
Immature Granulocytes: 0 %
Lymphocytes Relative: 37 %
Lymphs Abs: 0.9 10*3/uL (ref 0.7–4.0)
MCH: 37.9 pg — ABNORMAL HIGH (ref 26.0–34.0)
MCHC: 35.5 g/dL (ref 30.0–36.0)
MCV: 106.7 fL — ABNORMAL HIGH (ref 80.0–100.0)
Monocytes Absolute: 0.3 10*3/uL (ref 0.1–1.0)
Monocytes Relative: 11 %
Neutro Abs: 1.2 10*3/uL — ABNORMAL LOW (ref 1.7–7.7)
Neutrophils Relative %: 48 %
Platelets: 163 10*3/uL (ref 150–400)
RBC: 3.14 MIL/uL — ABNORMAL LOW (ref 3.87–5.11)
RDW: 15.5 % (ref 11.5–15.5)
WBC: 2.4 10*3/uL — ABNORMAL LOW (ref 4.0–10.5)
nRBC: 0 % (ref 0.0–0.2)

## 2021-10-10 LAB — COMPREHENSIVE METABOLIC PANEL
ALT: 24 U/L (ref 0–44)
AST: 35 U/L (ref 15–41)
Albumin: 3.6 g/dL (ref 3.5–5.0)
Alkaline Phosphatase: 55 U/L (ref 38–126)
Anion gap: 9 (ref 5–15)
BUN: 14 mg/dL (ref 8–23)
CO2: 26 mmol/L (ref 22–32)
Calcium: 9.5 mg/dL (ref 8.9–10.3)
Chloride: 103 mmol/L (ref 98–111)
Creatinine, Ser: 0.99 mg/dL (ref 0.44–1.00)
GFR, Estimated: 58 mL/min — ABNORMAL LOW (ref >60)
Glucose, Bld: 192 mg/dL — ABNORMAL HIGH (ref 70–99)
Potassium: 3.5 mmol/L (ref 3.5–5.1)
Sodium: 138 mmol/L (ref 135–145)
Total Bilirubin: 1.4 mg/dL — ABNORMAL HIGH (ref 0.3–1.2)
Total Protein: 7 g/dL (ref 6.5–8.1)

## 2021-10-10 MED ORDER — SODIUM CHLORIDE 0.9 % IV SOLN
INTRAVENOUS | Status: DC
  Filled 2021-10-10: qty 250

## 2021-10-10 MED ORDER — HEPARIN SOD (PORK) LOCK FLUSH 100 UNIT/ML IV SOLN
250.0000 [IU] | Freq: Once | INTRAVENOUS | Status: AC | PRN
  Administered 2021-10-10: 250 [IU]
  Filled 2021-10-10: qty 5

## 2021-10-10 MED ORDER — ZOLEDRONIC ACID 4 MG/100ML IV SOLN
4.0000 mg | Freq: Once | INTRAVENOUS | Status: AC
  Administered 2021-10-10: 4 mg via INTRAVENOUS
  Filled 2021-10-10: qty 100

## 2021-10-10 NOTE — Progress Notes (Signed)
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  Patient has been on Ibrance and letrozole since then.  She had cytopenias with [name] and is taking 75 mg 3 weeks on 1 week off which was started in September 2018.  Patient is on Zometa for bone metastases which she is getting every 3 months. Patient had extensive RLE DVT in April 2019 and is currently on eliquis. She also underwent venous thrombectomy and thrombolytic therapy and IVC placement by Dr. Wyn Quaker.  She also has peripheral vascular disease secondary to atherosclerosis and underwent stenting with vascular surgery as well    Interval history-patient is currently being worked up by cardiology for chest pain and will be undergoing cardiac cath in the next week.  Denies any active chest pain at this time  ECOG PS- 1 Pain scale- 0   Review of systems- Review of Systems  Constitutional:  Negative for chills, fever, malaise/fatigue and weight loss.  HENT:  Negative for congestion, ear discharge and nosebleeds.   Eyes:  Negative for blurred vision.  Respiratory:  Negative for cough, hemoptysis, sputum production, shortness of breath and wheezing.   Cardiovascular:  Negative for chest pain, palpitations, orthopnea and claudication.  Gastrointestinal:  Negative for abdominal pain, blood in stool,  constipation, diarrhea, heartburn, melena, nausea and vomiting.  Genitourinary:  Negative for dysuria, flank pain, frequency, hematuria and urgency.  Musculoskeletal:  Negative for back pain, joint pain and myalgias.  Skin:  Negative for rash.  Neurological:  Negative for dizziness, tingling, focal weakness, seizures, weakness and headaches.  Endo/Heme/Allergies:  Does not bruise/bleed easily.  Psychiatric/Behavioral:  Negative for depression and suicidal ideas. The patient does not have insomnia.       Allergies  Allergen Reactions   Codeine Rash    hallucinations     Past Medical History:  Diagnosis Date   Breast cancer (HCC)    Cancer (HCC)    left breast   Coronary artery disease 08/30/2021   Coronary Calcium Score 788.  Combination of calcified and noncalcified plaque-severe lesion in mid RCA (estimated greater than 70%).  Moderate LAD and LCx 50 to 69%). => CAD RADS 4 with severe stenosis suggested in the RCA.  FFRCT not submitted due to motion artifact.-Recommended cardiac catheterization.   DVT (deep venous thrombosis) (HCC)    2019   Hypertension    Port catheter in place 09/14/2016   Placed 09/10/2016 RT chest.     Past Surgical History:  Procedure Laterality Date   ABDOMINAL HYSTERECTOMY     APPENDECTOMY     BREAST BIOPSY     CT CTA CORONARY W/CA SCORE W/CM &/OR WO/CM  09/01/2021   Coronary Calcium Score 788.  Combination of calcified and noncalcified plaque-severe lesion in mid RCA (estimated greater than 70%).  Moderate LAD and LCx 50 to 69%). => CAD RADS 4 with severe stenosis suggested in the RCA.  FFRCT not submitted  due to motion artifact.-Recommended cardiac catheterization.   CYST EXCISION Right 09/2016   Buttocks   IVC FILTER REMOVAL N/A 01/17/2018   Procedure: IVC FILTER REMOVAL;  Surgeon: Algernon Huxley, MD;  Location: [place] [facility];  Service: Cardiovascular;  Laterality: N/A;   LOWER EXTREMITY ANGIOGRAPHY Right 11/11/2017   Procedure: LOWER  EXTREMITY ANGIOGRAPHY;  Surgeon: Katha Cabal, MD;  Location: [place] [facility];  Service: Cardiovascular;  Laterality: Right;   PERIPHERAL VASCULAR THROMBECTOMY Right 08/30/2017   Procedure: PERIPHERAL VASCULAR THROMBECTOMY;  Surgeon: Algernon Huxley, MD;  Location: [place] [facility];  Service: Cardiovascular;  Laterality: Right;   PORTACATH PLACEMENT Right 09/10/2016   Procedure: INSERTION PORT-A-CATH;  Surgeon: Jules Husbands, MD;  Location: [facility];  Service: General;  Laterality: Right;    Social History   Socioeconomic History   Marital status: Married    Spouse name: Jeneen Rinks   Number of children: 2   Years of education: 6   Highest education level: Bachelor's degree (e.g., BA, AB, BS)  Occupational History   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[employer]  Tobacco Use   Smoking status: Never   Smokeless tobacco: Never  Vaping Use   Vaping Use: Never used  Substance and Sexual Activity   Alcohol use: No   Drug use: No   Sexual activity: Never  Other Topics Concern   Not on file  Social History Narrative   Not on file   Social Determinants of Health   Financial Resource Strain: Low Risk  (04/05/2017)   Overall Financial Resource Strain (CARDIA)    Difficulty of Paying Living Expenses: 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  Food Insecurity: No Food Insecurity (04/05/2017)   Hunger Vital Sign    Worried About Running Out of Food in the Last Year: Never true    Ran Out of Food in the Last Year: Never true  Transportation Needs: No Transportation Needs (04/05/2017)   PRAPARE - Hydrologist (Medical): No    Lack of Transportation (Non-Medical): No  Physical Activity: Unknown (04/05/2017)   Exercise Vital Sign    Days of Exercise per Week: Patient refused    Minutes of Exercise per Session: Patient refused  Stress: No Stress Concern Present (04/05/2017)   [place]     Feeling of Stress : Not at all  Social Connections: Unknown (04/05/2017)   Social Connection and Isolation Panel [NHANES]    Frequency of Communication with Friends and Family: More than three times a week    Frequency of Social Gatherings with Friends and Family: Once a week    Attends Religious Services: More than 4 times per year    Active Member of [employer] or Organizations: No    Attends Music therapist: Never    Marital Status: Not on file  Intimate Partner Violence: Not on file    Family History  Problem Relation Age of Onset   Parkinson's disease Mother    Heart disease Father    Bladder Cancer Father    Lung cancer Father    Heart disease Maternal Grandfather      Current Outpatient Medications:    acetaminophen (TYLENOL) 500 MG tablet, Take 500 mg by mouth daily as needed for moderate pain or headache. , Disp: , Rfl:    amLODipine (NORVASC) 10 MG tablet, Take 10 mg by mouth daily., Disp: , Rfl:    apixaban (  ELIQUIS) 5 MG TABS tablet, Take 1 tablet (5 mg total) by mouth 2 (two) times daily., Disp: 60 tablet, Rfl: 6   atorvastatin (LIPITOR) 40 MG tablet, Take 40 mg by mouth at bedtime., Disp: , Rfl:    Calcium Carb-Cholecalciferol (CALCIUM 600/VITAMIN D3 PO), Take 1 tablet by mouth daily., Disp: , Rfl:    hydrochlorothiazide (HYDRODIURIL) 12.5 MG tablet, Take 12.5 mg by mouth daily., Disp: , Rfl:    isosorbide mononitrate (IMDUR) 30 MG 24 hr tablet, Take 1 tablet (30 mg total) by mouth daily., Disp: 30 tablet, Rfl: 6   letrozole (FEMARA) 2.5 MG tablet, Take 1 tablet (2.5 mg total) by mouth daily., Disp: 90 tablet, Rfl: 5   losartan (COZAAR) 50 MG tablet, Take 1 tablet by mouth once a day  for high blood pressure- to replace 100 mg, Disp: , Rfl:    metoprolol succinate (TOPROL XL) 25 MG 24 hr tablet, Take 1 tablet (25 mg total) by mouth daily., Disp: 30 tablet, Rfl: 6   palbociclib (IBRANCE) 75 MG tablet, Take 1 tablet (75 mg total) by mouth daily. Take for 21 days  on, 7 days off, repeat every 28 days., Disp: 21 tablet, Rfl: 3   ibuprofen (ADVIL,MOTRIN) 400 MG tablet, Take 400 mg by mouth 3 (three) times daily as needed.  (Patient not taking: Reported on 10/10/2021), Disp: , Rfl:    lidocaine-prilocaine (EMLA) cream, Apply 1 application. topically as needed (port access)., Disp: 30 g, Rfl: 2   PREVIDENT 5000 BOOSTER PLUS 1.1 % PSTE, Place 1 application onto teeth 2 (two) times daily.  (Patient not taking: Reported on 10/09/2021), Disp: , Rfl:    prochlorperazine (COMPAZINE) 10 MG tablet, TAKE 1 TABLET BY MOUTH EVERY 6 HOURS AS NEEDED FOR NAUSEA AND VOMITING (Patient not taking: Reported on 10/10/2021), Disp: 30 tablet, Rfl: 0  Current Facility-Administered Medications:    sodium chloride flush (NS) 0.9 % injection 3 mL, 3 mL, Intravenous, Q12H, Leonie Man, MD  Facility-Administered Medications Ordered in Other Visits:    0.9 %  sodium chloride infusion, , Intravenous, Continuous, Sindy Guadeloupe, MD, Stopped at 10/10/21 1505  Physical exam:  Vitals:   10/10/21 1328  BP: (!) 165/76  Pulse: 68  Resp: 18  SpO2: 98%  Weight: 199 lb 8 oz (90.5 kg)  Height: $Remove'5\' 2"'[id number]$  (1.575 m)   Physical Exam Constitutional:      General: She is not in acute distress. Cardiovascular:     Rate and Rhythm: Normal rate and regular rhythm.     Heart sounds: Normal heart sounds.  Pulmonary:     Effort: Pulmonary effort is normal.     Breath sounds: Normal breath sounds.  Abdominal:     General: Bowel sounds are normal.     Palpations: Abdomen is soft.  Skin:    General: Skin is warm and dry.  Neurological:     Mental Status: She is alert and oriented to person, place, and time.         Latest Ref Rng & Units 10/10/2021    1:24 PM  CMP  Glucose 70 - 99 mg/dL 192   BUN 8 - 23 mg/dL 14   Creatinine 0.44 - 1.00 mg/dL 0.99   Sodium 135 - 145 mmol/L 138   Potassium 3.5 - 5.1 mmol/L 3.5   Chloride 98 - 111 mmol/L 103   CO2 22 - 32 mmol/L 26   Calcium 8.9 - 10.3  mg/dL 9.5   Total Protein 6.5 -  8.1 g/dL 7.0   Total Bilirubin 0.3 - 1.2 mg/dL 1.4   Alkaline Phos 38 - 126 U/L 55   AST 15 - 41 U/L 35   ALT 0 - 44 U/L 24       Latest Ref Rng & Units 10/10/2021    1:24 PM  CBC  WBC 4.0 - 10.5 K/uL 2.4   Hemoglobin 12.0 - 15.0 g/dL 11.9   Hematocrit 36.0 - 46.0 % 33.5   Platelets 150 - 400 K/uL 163     No images are attached to the encounter.  NM Bone Scan Whole Body  Result Date: 10/06/2021 CLINICAL DATA:  Breast cancer metastatic to bone, restaging assessment EXAM: NUCLEAR MEDICINE WHOLE BODY BONE SCAN TECHNIQUE: Whole body anterior and posterior images were obtained approximately 3 hours after intravenous injection of radiopharmaceutical. RADIOPHARMACEUTICALS:  22.9 mCi Technetium-21m MDP IV COMPARISON:  03/31/2021 FINDINGS: Degenerative activity along the shoulders and knees. Stable faintly accentuated activity throughout much of the skeleton but more notable along the spine, ribs, and sternum as well as the calvarium, reflecting the patient's chronic known metastatic disease, and not substantially changed from 03/31/2021. Renal activity is visible bilaterally. No new worrisome findings. IMPRESSION: 1. Low-grade widespread activity but most notable along the spine, ribs, and calvarium, likely a manifestation of the patient's prior osseous metastatic disease. No new or progressive lesion. 2. Arthropathy along the shoulders and knees. Electronically Signed   By: Van Clines M.D.   On: 10/06/2021 15:12   CT CHEST ABDOMEN PELVIS W CONTRAST  Result Date: 10/06/2021 CLINICAL DATA:  Restaging of metastatic breast cancer * Tracking Code: BO * EXAM: CT CHEST, ABDOMEN, AND PELVIS WITH CONTRAST TECHNIQUE: Multidetector CT imaging of the chest, abdomen and pelvis was performed following the standard protocol during bolus administration of intravenous contrast. RADIATION DOSE REDUCTION: This exam was performed according to the departmental dose-optimization  program which includes automated exposure control, adjustment of the mA and/or kV according to patient size and/or use of iterative reconstruction technique. CONTRAST:  27mL OMNIPAQUE IOHEXOL 350 MG/ML SOLN COMPARISON:  Multiple exams, including 03/31/2021 FINDINGS: CT CHEST FINDINGS Cardiovascular: Right Port-A-Cath tip: SVC. Coronary, aortic arch, and branch vessel atherosclerotic vascular disease. Stable cardiomegaly. There is contrast medium in the esophagus indicating dysmotility or reflux. Mediastinum/Nodes: Unremarkable Lungs/Pleura: Stable scarring in the left lower lobe. Musculoskeletal: Stable appearance of diffuse/widespread skeletal sclerosis compatible with previously treated osseous metastatic disease. No indicators of progression. CT ABDOMEN PELVIS FINDINGS Hepatobiliary: Chronically stable cyst or similar benign lesion anteriorly in the lateral segment left hepatic lobe along image 47 series 2, no change from 05/14/2020. Two small gallstones are visible in the gallbladder, the larger measuring 4 mm in long axis. No visible choledocholithiasis or biliary dilatation. Probable hepatic steatosis. Pancreas: Unremarkable Spleen: Unremarkable Adrenals/Urinary Tract: Duplicated left renal collecting system. Otherwise unremarkable. Stomach/Bowel: Transverse duodenal diverticulum without findings of inflammation. Descending and sigmoid colon diverticulosis. Vascular/Lymphatic: Atherosclerosis is present, including aortoiliac atherosclerotic disease. Stable chronic calcification posteriorly in the IVC on image 64 series 2, no change from 2021, presumably related to remote chronic thrombus. No pathologic adenopathy is observed. Reproductive: Uterus absent.  Adnexa unremarkable. Other: No supplemental non-categorized findings. Musculoskeletal: Widespread and relatively diffuse osseous sclerosis compatible with previously treated metastatic disease, no substantial change from prior exam. This likely is a  manifestation of affectively treated widespread osseous metastatic disease. There is evidence of spondylosis and degenerative disc disease in the lumbar spine most notably at L4-5, with a potential synovial cyst in the  left lateral recess at L4-5 which could potentially cause subarticular lateral recess stenosis. IMPRESSION: 1. Stable appearance of widespread/diffuse bony sclerosis compatible with previously treated osseous metastatic disease. No progression. No findings of soft tissue malignancy. 2. Other imaging findings of potential clinical significance: Aortic Atherosclerosis (ICD10-I70.0). Coronary atherosclerosis. Stable cardiomegaly. Esophageal dysmotility versus reflux. Cholelithiasis. Duplicated left renal collecting system. Descending and sigmoid colon diverticulosis. Potential left-sided impingement at L4-5. Electronically Signed   By: Van Clines M.D.   On: 10/06/2021 15:09   ECHOCARDIOGRAM COMPLETE  Result Date: 10/01/2021    ECHOCARDIOGRAM REPORT   Patient Name:   FUSAE FLORIO Date of Exam: 10/01/2021 Medical Rec #:  [id number]      Height:       62.0 in Accession #:    [id number]     Weight:       200.8 lb Date of Birth:  [birth year]      BSA:          1.915 m Patient Age:    5 years       BP:           138/62 mmHg Patient Gender: F              HR:           72 bpm. Exam Location:  [facility] Procedure: 2D Echo, Cardiac Doppler, Color Doppler and Intracardiac            Opacification Agent Indications:    R06.02 SOB; R07.9* Chest pain, unspecified  History:        Patient has no prior history of Echocardiogram examinations.                 PAD, Signs/Symptoms:Shortness of Breath, Chest Pain, Fatigue and                 Dizziness/Lightheadedness; Risk Factors:Non-Smoker, Hypertension                 and h/o cancer.  Sonographer:    Pilar Jarvis RDMS, RVT, RDCS Referring Phys: [place]  1. Left ventricular ejection fraction, by estimation, is 60 to 65%. The left  ventricle has normal function. The left ventricle has no regional wall motion abnormalities. Left ventricular diastolic parameters are consistent with Grade I diastolic dysfunction (impaired relaxation).  2. Right ventricular systolic function is normal. The right ventricular size is normal.  3. The mitral valve is normal in structure. No evidence of mitral valve regurgitation.  4. The aortic valve is normal in structure. Aortic valve regurgitation is not visualized. FINDINGS  Left Ventricle: Left ventricular ejection fraction, by estimation, is 60 to 65%. The left ventricle has normal function. The left ventricle has no regional wall motion abnormalities. Definity contrast agent was given IV to delineate the left ventricular  endocardial borders. The left ventricular internal cavity size was normal in size. There is no left ventricular hypertrophy. Left ventricular diastolic parameters are consistent with Grade I diastolic dysfunction (impaired relaxation). Right Ventricle: The right ventricular size is normal. No increase in right ventricular wall thickness. Right ventricular systolic function is normal. Left Atrium: Left atrial size was normal in size. Right Atrium: Right atrial size was normal in size. Pericardium: There is no evidence of pericardial effusion. Mitral Valve: The mitral valve is normal in structure. No evidence of mitral valve regurgitation. Tricuspid Valve: The tricuspid valve is normal in structure. Tricuspid valve regurgitation is not demonstrated. Aortic Valve: The aortic valve is normal  in structure. Aortic valve regurgitation is not visualized. Aortic valve mean gradient measures 6.5 mmHg. Aortic valve peak gradient measures 12.0 mmHg. Aortic valve area, by VTI measures 3.22 cm. Pulmonic Valve: The pulmonic valve was not well visualized. Pulmonic valve regurgitation is not visualized. Aorta: The aortic root and ascending aorta are structurally normal, with no evidence of dilitation. Venous:  The inferior vena cava was not well visualized. IAS/Shunts: No atrial level shunt detected by color flow Doppler.  LEFT VENTRICLE PLAX 2D LVIDd:         4.60 cm   Diastology LVIDs:         2.80 cm   LV e' medial:    6.42 cm/s LV PW:         1.00 cm   LV E/e' medial:  15.0 LV IVS:        1.10 cm   LV e' lateral:   8.49 cm/s LVOT diam:     2.10 cm   LV E/e' lateral: 11.3 LV SV:         118 LV SV Index:   62 LVOT Area:     3.46 cm  RIGHT VENTRICLE RV S prime:     11.20 cm/s RVOT diam:      3.60 cm TAPSE (M-mode): 2.5 cm LEFT ATRIUM             Index        RIGHT ATRIUM           Index LA diam:        4.00 cm 2.09 cm/m   RA Area:     14.90 cm LA Vol (A2C):   56.6 ml 29.56 ml/m  RA Volume:   36.40 ml  19.01 ml/m LA Vol (A4C):   61.0 ml 31.85 ml/m LA Biplane Vol: 61.2 ml 31.96 ml/m  AORTIC VALVE                     PULMONIC VALVE AV Area (Vmax):    3.19 cm      PV Vmax:       1.41 m/s AV Area (Vmean):   2.94 cm      PV Peak grad:  8.0 mmHg AV Area (VTI):     3.22 cm AV Vmax:           173.50 cm/s AV Vmean:          119.000 cm/s AV VTI:            0.368 m AV Peak Grad:      12.0 mmHg AV Mean Grad:      6.5 mmHg LVOT Vmax:         160.00 cm/s LVOT Vmean:        101.000 cm/s LVOT VTI:          0.342 m LVOT/AV VTI ratio: 0.93  AORTA Ao Root diam: 2.60 cm Ao Asc diam:  2.40 cm MITRAL VALVE MV Area (PHT): 2.58 cm     SHUNTS MV Decel Time: 294 msec     Systemic VTI:  0.34 m MV E velocity: 96.10 cm/s   Systemic Diam: 2.10 cm MV A velocity: 129.00 cm/s  Pulmonic Diam: 3.60 cm MV E/A ratio:  0.74 Kate Sable MD Electronically signed by Kate Sable MD Signature Date/Time: 10/01/2021/2:36:37 PM    Final      Assessment and plan- Patient is a 81 y.o. female with metastatic ER positive breast cancer and bone metastases here for  routine follow-up  I reviewed CT chest abdomen and pelvis images independently along with bone scan which shows stable areas of of bony metastatic disease and no new lesions.  She is  on letrozole and Ibrance which she will continue until progression or toxicity.  She has chronic drug-induced mild neutropenia secondary to [facility] but her ANC is greater than 1 and therefore no dose adjustment is needed.  Patient will also continue letrozole until progression or toxicity.  Bone metastases: Will receive Zometa today.  History of lower extremity DVT on Eliquis.  Will be on hold prior to her cardiac catheterization procedure.  I will see her back in 3 months with CBC with differential CMP and CA 27-29 for her next dose of Zometa   Visit Diagnosis 1. Malignant neoplasm metastatic to bone ([place])   2. High risk medication use   3. Use of letrozole (Femara)      Dr. Randa Evens, MD, MPH [facility] [phone] 10/10/2021 3:51 PM

## 2021-10-10 NOTE — Patient Instructions (Signed)
[facility] CANCER CTR AT [place]  Discharge Instructions: Thank you for choosing [facility] to provide your oncology and hematology care.  If you have a lab appointment with the [facility], please go directly to the [facility] and check in at the registration area.  Wear comfortable clothing and clothing appropriate for easy access to any Portacath or PICC line.   We strive to give you quality time with your provider. You may need to reschedule your appointment if you arrive late (15 or more minutes).  Arriving late affects you and other patients whose appointments are after yours.  Also, if you miss three or more appointments without notifying the office, you may be dismissed from the clinic at the provider's discretion.      For prescription refill requests, have your pharmacy contact our office and allow 72 hours for refills to be completed.    Today you received the following chemotherapy and/or immunotherapy agents ZOMETA      To help prevent nausea and vomiting after your treatment, we encourage you to take your nausea medication as directed.  BELOW ARE SYMPTOMS THAT SHOULD BE REPORTED IMMEDIATELY: *FEVER GREATER THAN 100.4 F (38 C) OR HIGHER *CHILLS OR SWEATING *NAUSEA AND VOMITING THAT IS NOT CONTROLLED WITH YOUR NAUSEA MEDICATION *UNUSUAL SHORTNESS OF BREATH *UNUSUAL BRUISING OR BLEEDING *URINARY PROBLEMS (pain or burning when urinating, or frequent urination) *BOWEL PROBLEMS (unusual diarrhea, constipation, pain near the anus) TENDERNESS IN MOUTH AND THROAT WITH OR WITHOUT PRESENCE OF ULCERS (sore throat, sores in mouth, or a toothache) UNUSUAL RASH, SWELLING OR PAIN  UNUSUAL VAGINAL DISCHARGE OR ITCHING   Items with * indicate a potential emergency and should be followed up as soon as possible or go to the Emergency Department if any problems should occur.  Please show the CHEMOTHERAPY ALERT CARD or IMMUNOTHERAPY ALERT CARD at check-in to the  Emergency Department and triage nurse.  Should you have questions after your visit or need to cancel or reschedule your appointment, please contact [facility] CANCER [place] AT [place]  [phone] and follow the prompts.  Office hours are 8:00 a.m. to 4:30 p.m. Monday - Friday. Please note that voicemails left after 4:00 p.m. may not be returned until the following business day.  We are closed weekends and major holidays. You have access to a nurse at all times for urgent questions. Please call the main number to the clinic [phone] and follow the prompts.  For any non-urgent questions, you may also contact your provider using MyChart. We now offer e-Visits for anyone 67 and older to request care online for non-urgent symptoms. For details visit [email or web address].   Also download the MyChart app! Go to the app store, search "MyChart", open the app, select [facility], and log in with your MyChart username and password.  Masks are optional in the cancer centers. If you would like for your care team to wear a mask while they are taking care of you, please let them know. For doctor visits, patients may have with them one support person who is at least 79 years old. At this time, visitors are not allowed in the infusion area.   Zoledronic Acid Injection (Cancer) What is this medication? ZOLEDRONIC ACID (ZOE le dron ik AS id) treats high calcium levels in the blood caused by cancer. It may also be used with chemotherapy to treat weakened bones caused by cancer. It works by slowing down the release of calcium from bones. This lowers calcium levels  in your blood. It also makes your bones stronger and less likely to break (fracture). It belongs to a group of medications called bisphosphonates. This medicine may be used for other purposes; ask your health care provider or pharmacist if you have questions. COMMON BRAND NAME(S): Zometa, Zometa Powder What should I tell my care team before I  take this medication? They need to know if you have any of these conditions: Dehydration Dental disease Kidney disease Liver disease Low levels of calcium in the blood Lung or breathing disease, such as asthma Receiving steroids, such as dexamethasone or prednisone An unusual or allergic reaction to zoledronic acid, other medications, foods, dyes, or preservatives Pregnant or trying to get pregnant Breast-feeding How should I use this medication? This medication is injected into a vein. It is given by your care team in a hospital or clinic setting. Talk to your care team about the use of this medication in children. Special care may be needed. Overdosage: If you think you have taken too much of this medicine contact a poison control center or emergency room at once. NOTE: This medicine is only for you. Do not share this medicine with others. What if I miss a dose? Keep appointments for follow-up doses. It is important not to miss your dose. Call your care team if you are unable to keep an appointment. What may interact with this medication? Certain antibiotics given by injection Diuretics, such as bumetanide, furosemide NSAIDs, medications for pain and inflammation, such as ibuprofen or naproxen Teriparatide Thalidomide This list may not describe all possible interactions. Give your health care provider a list of all the medicines, herbs, non-prescription drugs, or dietary supplements you use. Also tell them if you smoke, drink alcohol, or use illegal drugs. Some items may interact with your medicine. What should I watch for while using this medication? Visit your care team for regular checks on your progress. It may be some time before you see the benefit from this medication. Some people who take this medication have severe bone, joint, or muscle pain. This medication may also increase your risk for jaw problems or a broken thigh bone. Tell your care team right away if you have severe  pain in your jaw, bones, joints, or muscles. Tell you care team if you have any pain that does not go away or that gets worse. Tell your dentist and dental surgeon that you are taking this medication. You should not have major dental surgery while on this medication. See your dentist to have a dental exam and fix any dental problems before starting this medication. Take good care of your teeth while on this medication. Make sure you see your dentist for regular follow-up appointments. You should make sure you get enough calcium and vitamin D while you are taking this medication. Discuss the foods you eat and the vitamins you take with your care team. Check with your care team if you have severe diarrhea, nausea, and vomiting, or if you sweat a lot. The loss of too much body fluid may make it dangerous for you to take this medication. You may need bloodwork while taking this medication. Talk to your care team if you wish to become pregnant or think you might be pregnant. This medication can cause serious birth defects. What side effects may I notice from receiving this medication? Side effects that you should report to your care team as soon as possible: Allergic reactions--skin rash, itching, hives, swelling of the face, lips, tongue, or  throat Kidney injury--decrease in the amount of urine, swelling of the ankles, hands, or feet Low calcium level--muscle pain or cramps, confusion, tingling, or numbness in the hands or feet Osteonecrosis of the jaw--pain, swelling, or redness in the mouth, numbness of the jaw, poor healing after dental work, unusual discharge from the mouth, visible bones in the mouth Severe bone, joint, or muscle pain Side effects that usually do not require medical attention (report to your care team if they continue or are bothersome): Constipation Fatigue Fever Loss of appetite Nausea Stomach pain This list may not describe all possible side effects. Call your doctor for  medical advice about side effects. You may report side effects to FDA at 1-800-FDA-1088. Where should I keep my medication? This medication is given in a hospital or clinic. It will not be stored at home. NOTE: This sheet is a summary. It may not cover all possible information. If you have questions about this medicine, talk to your doctor, pharmacist, or health care provider.  2023 Elsevier/Gold Standard (2021-05-23 00:00:00)

## 2021-10-11 LAB — CANCER ANTIGEN 27.29: CA 27.29: 77.1 U/mL — ABNORMAL HIGH (ref 0.0–38.6)

## 2021-10-14 ENCOUNTER — Other Ambulatory Visit: Payer: Self-pay

## 2021-10-14 ENCOUNTER — Emergency Department: Payer: Medicare Other

## 2021-10-14 ENCOUNTER — Inpatient Hospital Stay
Admission: EM | Admit: 2021-10-14 | Discharge: 2021-10-17 | DRG: 247 | Disposition: A | Discharge status: Home / Self Care | Payer: Medicare Other | Attending: Osteopathic Medicine | Admitting: Osteopathic Medicine

## 2021-10-14 DIAGNOSIS — I4510 Unspecified right bundle-branch block: Secondary | ICD-10-CM | POA: Diagnosis present

## 2021-10-14 DIAGNOSIS — Z820 Family history of epilepsy and other diseases of the nervous system: Secondary | ICD-10-CM

## 2021-10-14 DIAGNOSIS — Z79811 Long term (current) use of aromatase inhibitors: Secondary | ICD-10-CM

## 2021-10-14 DIAGNOSIS — I209 Angina pectoris, unspecified: Secondary | ICD-10-CM

## 2021-10-14 DIAGNOSIS — I200 Unstable angina: Secondary | ICD-10-CM | POA: Diagnosis not present

## 2021-10-14 DIAGNOSIS — Z86718 Personal history of other venous thrombosis and embolism: Secondary | ICD-10-CM

## 2021-10-14 DIAGNOSIS — Z86711 Personal history of pulmonary embolism: Secondary | ICD-10-CM

## 2021-10-14 DIAGNOSIS — Z8052 Family history of malignant neoplasm of bladder: Secondary | ICD-10-CM

## 2021-10-14 DIAGNOSIS — E876 Hypokalemia: Secondary | ICD-10-CM

## 2021-10-14 DIAGNOSIS — C50919 Malignant neoplasm of unspecified site of unspecified female breast: Secondary | ICD-10-CM | POA: Diagnosis present

## 2021-10-14 DIAGNOSIS — Z8249 Family history of ischemic heart disease and other diseases of the circulatory system: Secondary | ICD-10-CM

## 2021-10-14 DIAGNOSIS — I739 Peripheral vascular disease, unspecified: Secondary | ICD-10-CM | POA: Diagnosis present

## 2021-10-14 DIAGNOSIS — I25110 Atherosclerotic heart disease of native coronary artery with unstable angina pectoris: Secondary | ICD-10-CM

## 2021-10-14 DIAGNOSIS — R931 Abnormal findings on diagnostic imaging of heart and coronary circulation: Secondary | ICD-10-CM

## 2021-10-14 DIAGNOSIS — Z885 Allergy status to narcotic agent status: Secondary | ICD-10-CM

## 2021-10-14 DIAGNOSIS — Z801 Family history of malignant neoplasm of trachea, bronchus and lung: Secondary | ICD-10-CM

## 2021-10-14 DIAGNOSIS — C7951 Secondary malignant neoplasm of bone: Secondary | ICD-10-CM | POA: Diagnosis present

## 2021-10-14 DIAGNOSIS — R079 Chest pain, unspecified: Secondary | ICD-10-CM | POA: Diagnosis not present

## 2021-10-14 DIAGNOSIS — R0609 Other forms of dyspnea: Secondary | ICD-10-CM

## 2021-10-14 DIAGNOSIS — Z79899 Other long term (current) drug therapy: Secondary | ICD-10-CM

## 2021-10-14 DIAGNOSIS — I25119 Atherosclerotic heart disease of native coronary artery with unspecified angina pectoris: Secondary | ICD-10-CM

## 2021-10-14 DIAGNOSIS — I10 Essential (primary) hypertension: Secondary | ICD-10-CM | POA: Diagnosis present

## 2021-10-14 DIAGNOSIS — I214 Non-ST elevation (NSTEMI) myocardial infarction: Principal | ICD-10-CM | POA: Diagnosis present

## 2021-10-14 DIAGNOSIS — Z7901 Long term (current) use of anticoagulants: Secondary | ICD-10-CM

## 2021-10-14 DIAGNOSIS — Z853 Personal history of malignant neoplasm of breast: Secondary | ICD-10-CM

## 2021-10-14 MED ORDER — NITROGLYCERIN 2 % TD OINT
1.0000 [in_us] | TOPICAL_OINTMENT | Freq: Once | TRANSDERMAL | Status: AC
  Administered 2021-10-15: 1 [in_us] via TOPICAL
  Filled 2021-10-14: qty 1

## 2021-10-14 NOTE — Assessment & Plan Note (Signed)
Continue metoprolol, losartan, hydrochlorothiazide and amlodipine

## 2021-10-14 NOTE — ED Triage Notes (Signed)
Pt arrives by [facility] from home for chest pain that started approx 3 hours ago while she was sitting in her chair.   Pt is scheduled for a cardiac cath on 10/16/21 with Dr. Glenetta Hew.

## 2021-10-14 NOTE — Assessment & Plan Note (Signed)
On apixaban secondary to history of DVT 3 years prior with history of thrombectomy Continue apixaban

## 2021-10-14 NOTE — ED Provider Notes (Signed)
[facility] Provider Note    Event Date/Time   First MD Initiated Contact with Patient 10/14/21 2307     (approximate)   History   Chest Pain   HPI  Victoria Steele is a 81 y.o. female brought to the ED via EMS from home with a chief complaint of chest pain.  Patient has a history of metastatic breast cancer who has been having waxing/waning chest pain since June.  She was referred to cardiology by her PCP and scheduled for cardiac cath on 10/16/2021.  Approximately 7 PM she was sitting in her chair and developed left chest tightness radiating to her back.  Denies associated diaphoresis, shortness of breath, palpitations, nausea/vomiting or dizziness.  Denies abdominal pain or diarrhea.  Denies recent travel, trauma or hormone use.     Past Medical History   Past Medical History:  Diagnosis Date  . Breast cancer ([place])   . Cancer [facility])    left breast  . Coronary artery disease 08/30/2021   Coronary Calcium Score 788.  Combination of calcified and noncalcified plaque-severe lesion in mid RCA (estimated greater than 70%).  Moderate LAD and LCx 50 to 69%). => CAD RADS 4 with severe stenosis suggested in the RCA.  FFRCT not submitted due to motion artifact.-Recommended cardiac catheterization.  . DVT (deep venous thrombosis) ([place])    2019  . Hypertension   . Port catheter in place 09/14/2016   Placed 09/10/2016 RT chest.     Active Problem List   Patient Active Problem List   Diagnosis Date Noted  . Abnormal cardiac CT angiography 09/11/2021  . Pre-procedure lab exam 08/30/2021  . DOE (dyspnea on exertion) 08/28/2021  . 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 PAD (peripheral artery disease) ([place]) 11/10/2017  . Essential hypertension 08/27/2017  . DVT (deep venous thrombosis) ([place]) 08/27/2017  . Mass 10/15/2016  . Neutropenic fever ([place]) 10/09/2016  . Port-A-Cath in place 09/14/2016  . Cancer ([place])   . Goals of care, counseling/discussion 09/04/2016   . Breast cancer in female [facility]) 09/04/2016  . 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 ABDOMINAL HYSTERECTOMY    . APPENDECTOMY    . BREAST BIOPSY    . CT CTA CORONARY W/CA SCORE W/CM &/OR WO/CM  09/01/2021   Coronary Calcium Score 788.  Combination of calcified and noncalcified plaque-severe lesion in mid RCA (estimated greater than 70%).  Moderate LAD and LCx 50 to 69%). => CAD RADS 4 with severe stenosis suggested in the RCA.  FFRCT not submitted due to motion artifact.-Recommended cardiac catheterization.  Marland Kitchen CYST EXCISION Right 09/2016   Buttocks  . IVC FILTER REMOVAL N/A 01/17/2018   Procedure: IVC FILTER REMOVAL;  Surgeon: Algernon Huxley, MD;  Location: [facility] CV LAB;  Service: Cardiovascular;  Laterality: N/A;  . LOWER EXTREMITY ANGIOGRAPHY Right 11/11/2017   Procedure: LOWER EXTREMITY ANGIOGRAPHY;  Surgeon: Katha Cabal, MD;  Location: [facility] CV LAB;  Service: Cardiovascular;  Laterality: Right;  . PERIPHERAL VASCULAR THROMBECTOMY Right 08/30/2017   Procedure: PERIPHERAL VASCULAR THROMBECTOMY;  Surgeon: Algernon Huxley, MD;  Location: [facility] CV LAB;  Service: Cardiovascular;  Laterality: Right;  . PORTACATH PLACEMENT Right 09/10/2016   Procedure: INSERTION PORT-A-CATH;  Surgeon: Jules Husbands, MD;  Location: [facility] ORS;  Service: General;  Laterality: Right;     Home Medications   Prior to Admission  medications   Medication Sig Start Date End Date Taking? Authorizing Provider  acetaminophen (TYLENOL) 500 MG tablet Take 500 mg by mouth daily as needed for moderate pain or headache.     [provider]  amLODipine (NORVASC) 10 MG tablet Take 10 mg by mouth daily.    [provider]  apixaban (ELIQUIS) 5 MG TABS tablet Take 1 tablet (5 mg total) by mouth 2 (two) times daily. 06/16/21   Sindy Guadeloupe, MD  atorvastatin (LIPITOR) 40 MG tablet Take  40 mg by mouth at bedtime. 03/17/21   [provider]  Calcium Carb-Cholecalciferol (CALCIUM 600/VITAMIN D3 PO) Take 1 tablet by mouth daily.    [provider]  hydrochlorothiazide (HYDRODIURIL) 12.5 MG tablet Take 12.5 mg by mouth daily. 03/26/20   [provider]  ibuprofen (ADVIL,MOTRIN) 400 MG tablet Take 400 mg by mouth 3 (three) times daily as needed.  Patient not taking: Reported on 10/10/2021 05/05/18   [provider]  isosorbide mononitrate (IMDUR) 30 MG 24 hr tablet Take 1 tablet (30 mg total) by mouth daily. 09/11/21   Leonie Man, MD  letrozole [facility]) 2.5 MG tablet Take 1 tablet (2.5 mg total) by mouth daily. 09/09/21   Sindy Guadeloupe, MD  lidocaine-prilocaine (EMLA) cream Apply 1 application. topically as needed (port access). 07/09/21   Sindy Guadeloupe, MD  losartan (COZAAR) 50 MG tablet Take 1 tablet by mouth once a day  for high blood pressure- to replace 100 mg 03/26/20   [provider]  metoprolol succinate (TOPROL XL) 25 MG 24 hr tablet Take 1 tablet (25 mg total) by mouth daily. 09/11/21   Leonie Man, MD  palbociclib [facility]) 75 MG tablet Take 1 tablet (75 mg total) by mouth daily. Take for 21 days on, 7 days off, repeat every 28 days. 08/04/21   Sindy Guadeloupe, MD  PREVIDENT 5000 BOOSTER PLUS 1.1 % PSTE Place 1 application onto teeth 2 (two) times daily.  Patient not taking: Reported on 10/09/2021 12/25/16   [provider]  prochlorperazine (COMPAZINE) 10 MG tablet TAKE 1 TABLET BY MOUTH EVERY 6 HOURS AS NEEDED FOR NAUSEA AND VOMITING Patient not taking: Reported on 10/10/2021 06/13/20   Jacquelin Hawking, NP     Allergies  Codeine   Family History   Family History  Problem Relation Age of Onset  . Parkinson's disease Mother   . Heart disease Father   . Bladder Cancer Father   . Lung cancer Father   . Heart disease Maternal Grandfather      Physical Exam  Triage Vital Signs: ED Triage Vitals  Enc Vitals  Group     BP 10/14/21 2248 (!) 175/49     Pulse Rate 10/14/21 2248 84     Resp 10/14/21 2248 18     Temp 10/14/21 2248 98.6 F (37 C)     Temp Source 10/14/21 2248 Oral     SpO2 10/14/21 2248 97 %     Weight 10/14/21 2249 200 lb (90.7 kg)     Height 10/14/21 2249 '5\' 2"'$  (1.575 m)     Head Circumference --      Peak Flow --      Pain Score 10/14/21 2249 6     Pain Loc --      Pain Edu? --      Excl. in [place]? --     Updated Vital Signs: BP (!) 175/49 (BP Location: Right Arm)   Pulse  84   Temp 98.6 F (37 C) (Oral)   Resp 18   Ht '5\' 2"'$  (1.575 m)   Wt 90.7 kg   SpO2 97%   BMI 36.58 kg/m    General: Awake, no distress.  CV:  RRR.  Good peripheral perfusion.  Resp:  Normal effort.  CTA B. Abd:  Nontender.  No distention.  Other:  Both calves are nontender and nonswollen.   ED Results / Procedures / Treatments  Labs (all labs ordered are listed, but only abnormal results are displayed) Labs Reviewed - No data to display   EKG  ED ECG REPORT I, Rayleen Wyrick J, the attending physician, personally viewed and interpreted this ECG.   Date: 10/14/2021  EKG Time: 2242  Rate: 86  Rhythm: normal sinus rhythm  Axis: Normal  Intervals:right bundle branch block  ST&T Change: Nonspecific    RADIOLOGY I have independently visualized and interpreted patient's chest x-ray as well as noted the radiology interpretation:  Chest x-ray:  Official radiology report(s): No results found.   PROCEDURES:  Critical Care performed: {CriticalCareYesNo:19197::"Yes, see critical care procedure note(s)","No"}  .1-3 Lead EKG Interpretation  Performed by: Paulette Blanch, MD Authorized by: Paulette Blanch, MD     Interpretation: normal     ECG rate:  85   ECG rate assessment: normal     Rhythm: sinus rhythm     Ectopy: none     Conduction: normal   Comments:     Patient placed on cardiac monitor to evaluate for arrhythmias    MEDICATIONS ORDERED IN ED: Medications  nitroGLYCERIN  (NITROGLYN) 2 % ointment 1 inch (has no administration in time range)     IMPRESSION / MDM / ASSESSMENT AND PLAN / ED COURSE  I reviewed the triage vital signs and the nursing notes.                             81 year old female presenting with chest pain, unstable angina.  Scheduled for cardiac cath on 10/16/2021. Differential diagnosis includes, but is not limited to, ACS, aortic dissection, pulmonary embolism, cardiac tamponade, pneumothorax, pneumonia, pericarditis, myocarditis, GI-related causes including esophagitis/gastritis, and musculoskeletal chest wall pain.   I have personally reviewed patient's records and see her cardiology office visit from 10/09/2021 for class III angina.  I did also see that patient had CT chest/abdomen/pelvis on 10/06/2021 demonstrated metastatic disease.  Patient's presentation is most consistent with acute presentation with potential threat to life or bodily function.  The patient is on the cardiac monitor to evaluate for evidence of arrhythmia and/or significant heart rate changes.  We will obtain cardiac panel, chest x-ray.  Administer nitroglycerin paste.  Will discuss with hospitalist services for evaluation and admission.      FINAL CLINICAL IMPRESSION(S) / ED DIAGNOSES   Final diagnoses:  Unstable angina (HCC)  Hypertension, unspecified type     Rx / DC Orders   ED Discharge Orders     None        Note:  This document was prepared using Dragon voice recognition software and may include unintentional dictation errors.

## 2021-10-14 NOTE — Assessment & Plan Note (Addendum)
Currently on Ibrance and letrozole and to receive Zometa, followed by Dr. Janese Banks

## 2021-10-14 NOTE — H&P (Incomplete)
History and Physical    Patient: Victoria Steele LNL:[id number] DOB: [birth year] DOA: 10/14/2021 DOS: the patient was seen and examined on 10/15/2021 PCP: Letta Median, MD  Patient coming from: Home  Chief Complaint:  Chief Complaint  Patient presents with   Chest Pain    HPI: Victoria Steele is a 81 y.o. female with medical history significant for PAD, HTN, metastatic breast cancer on Ibrance and letrozole, prior PE on apixaban, class III angina who is scheduled for cardiac cath on 10/16/21 who presents to the ED after she developed severe precordial chest pain 8 out of 8 while at rest.  She had no associated nausea, vomiting or diaphoresis.  Denies shortness of breath or lower extremity pain or swelling.  Patient has been off her apixaban for the past 2 days in preparation for her upcoming procedure. ED course and data review: BP 175/49 with otherwise normal vitals Labs:First troponin 19 EKG: Personally viewed and interpreted and without acute ST-T wave changes Chest x-ray:Changes consistent with bony metastatic disease.  No acute infiltrate  Patient treated with an inch of Nitropaste with improvement in pain from 8 out of 10 to 2 out of 10.  Hospitalist consulted for admission.   Review of Systems: As mentioned in the history of present illness. All other systems reviewed and are negative.  Past Medical History:  Diagnosis Date   Breast cancer ([place])    Cancer ([place])    left breast   Coronary artery disease 08/30/2021   Coronary Calcium Score 788.  Combination of calcified and noncalcified plaque-severe lesion in mid RCA (estimated greater than 70%).  Moderate LAD and LCx 50 to 69%). => CAD RADS 4 with severe stenosis suggested in the RCA.  FFRCT not submitted due to motion artifact.-Recommended cardiac catheterization.   DVT (deep venous thrombosis) ([place])    2019   Hypertension    Port catheter in place 09/14/2016   Placed 09/10/2016 RT chest.   Past Surgical History:   Procedure Laterality Date   ABDOMINAL HYSTERECTOMY     APPENDECTOMY     BREAST BIOPSY     CT CTA CORONARY W/CA SCORE W/CM &/OR WO/CM  09/01/2021   Coronary Calcium Score 788.  Combination of calcified and noncalcified plaque-severe lesion in mid RCA (estimated greater than 70%).  Moderate LAD and LCx 50 to 69%). => CAD RADS 4 with severe stenosis suggested in the RCA.  FFRCT not submitted due to motion artifact.-Recommended cardiac catheterization.   CYST EXCISION Right 09/2016   Buttocks   IVC FILTER REMOVAL N/A 01/17/2018   Procedure: IVC FILTER REMOVAL;  Surgeon: Algernon Huxley, MD;  Location: [place] [facility];  Service: Cardiovascular;  Laterality: N/A;   LOWER EXTREMITY ANGIOGRAPHY Right 11/11/2017   Procedure: LOWER EXTREMITY ANGIOGRAPHY;  Surgeon: Katha Cabal, MD;  Location: [place] [facility];  Service: Cardiovascular;  Laterality: Right;   PERIPHERAL VASCULAR THROMBECTOMY Right 08/30/2017   Procedure: PERIPHERAL VASCULAR THROMBECTOMY;  Surgeon: Algernon Huxley, MD;  Location: [place] [facility];  Service: Cardiovascular;  Laterality: Right;   PORTACATH PLACEMENT Right 09/10/2016   Procedure: INSERTION PORT-A-CATH;  Surgeon: Jules Husbands, MD;  Location: [facility];  Service: General;  Laterality: Right;   Social History:  reports that she has never smoked. She has never used smokeless tobacco. She reports that she does not drink alcohol and does not use drugs.  Allergies  Allergen Reactions   Codeine Rash    hallucinations    Family  History  Problem Relation Age of Onset   Parkinson's disease Mother    Heart disease Father    Bladder Cancer Father    Lung cancer Father    Heart disease Maternal Grandfather     Prior to Admission medications   Medication Sig Start Date End Date Taking? Authorizing Provider  acetaminophen (TYLENOL) 500 MG tablet Take 500 mg by mouth daily as needed for moderate pain or headache.     [provider]  amLODipine (NORVASC)  10 MG tablet Take 10 mg by mouth daily.    [provider]  apixaban (ELIQUIS) 5 MG TABS tablet Take 1 tablet (5 mg total) by mouth 2 (two) times daily. 06/16/21   Sindy Guadeloupe, MD  atorvastatin (LIPITOR) 40 MG tablet Take 40 mg by mouth at bedtime. 03/17/21   [provider]  Calcium Carb-Cholecalciferol (CALCIUM 600/VITAMIN D3 PO) Take 1 tablet by mouth daily.    [provider]  hydrochlorothiazide (HYDRODIURIL) 12.5 MG tablet Take 12.5 mg by mouth daily. 03/26/20   [provider]  ibuprofen (ADVIL,MOTRIN) 400 MG tablet Take 400 mg by mouth 3 (three) times daily as needed.  Patient not taking: Reported on 10/10/2021 05/05/18   [provider]  isosorbide mononitrate (IMDUR) 30 MG 24 hr tablet Take 1 tablet (30 mg total) by mouth daily. 09/11/21   Leonie Man, MD  letrozole [facility]) 2.5 MG tablet Take 1 tablet (2.5 mg total) by mouth daily. 09/09/21   Sindy Guadeloupe, MD  lidocaine-prilocaine (EMLA) cream Apply 1 application. topically as needed (port access). 07/09/21   Sindy Guadeloupe, MD  losartan (COZAAR) 50 MG tablet Take 1 tablet by mouth once a day  for high blood pressure- to replace 100 mg 03/26/20   [provider]  metoprolol succinate (TOPROL XL) 25 MG 24 hr tablet Take 1 tablet (25 mg total) by mouth daily. 09/11/21   Leonie Man, MD  palbociclib [facility]) 75 MG tablet Take 1 tablet (75 mg total) by mouth daily. Take for 21 days on, 7 days off, repeat every 28 days. 08/04/21   Sindy Guadeloupe, MD  PREVIDENT 5000 BOOSTER PLUS 1.1 % PSTE Place 1 application onto teeth 2 (two) times daily.  Patient not taking: Reported on 10/09/2021 12/25/16   [provider]  prochlorperazine (COMPAZINE) 10 MG tablet TAKE 1 TABLET BY MOUTH EVERY 6 HOURS AS NEEDED FOR NAUSEA AND VOMITING Patient not taking: Reported on 10/10/2021 06/13/20   Jacquelin Hawking, NP    Physical Exam: Vitals:   10/14/21 2249 10/14/21 2300 10/15/21 0000 10/15/21 0100   BP:  (!) 155/68 (!) 143/50 (!) 142/51  Pulse:  85 83 77  Resp:  (!) 21 (!) 31 (!) 25  Temp:      TempSrc:      SpO2:  97% 98% 98%  Weight: 90.7 kg     Height: '5\' 2"'$  (1.575 m)      Physical Exam Vitals and nursing note reviewed.  Constitutional:      General: She is not in acute distress. HENT:     Head: Normocephalic and atraumatic.  Cardiovascular:     Rate and Rhythm: Normal rate and regular rhythm.     Heart sounds: Normal heart sounds.  Pulmonary:     Effort: Pulmonary effort is normal.     Breath sounds: Normal breath sounds.  Abdominal:     Palpations: Abdomen is soft.     Tenderness: There is no abdominal  tenderness.  Neurological:     Mental Status: Mental status is at baseline.     Labs on Admission: I have personally reviewed following labs and imaging studies  CBC: Recent Labs  Lab 10/10/21 1324 10/14/21 2247  WBC 2.4* 3.3*  NEUTROABS 1.2* 1.9  HGB 11.9* 12.1  HCT 33.5* 35.3*  MCV 106.7* 106.6*  PLT 163 619   Basic Metabolic Panel: Recent Labs  Lab 10/10/21 1324 10/14/21 2247  NA 138 140  K 3.5 3.2*  CL 103 105  CO2 26 23  GLUCOSE 192* 202*  BUN 14 13  CREATININE 0.99 1.10*  CALCIUM 9.5 9.8   GFR: Estimated Creatinine Clearance: 44.1 mL/min (A) (by C-G formula based on SCr of 1.1 mg/dL (H)). Liver Function Tests: Recent Labs  Lab 10/10/21 1324 10/14/21 2247  AST 35 40  ALT 24 27  ALKPHOS 55 77  BILITOT 1.4* 1.2  PROT 7.0 7.9  ALBUMIN 3.6 4.0   Recent Labs  Lab 10/14/21 2247  LIPASE 36   No results for input(s): "AMMONIA" in the last 168 hours. Coagulation Profile: No results for input(s): "INR", "PROTIME" in the last 168 hours. Cardiac Enzymes: No results for input(s): "CKTOTAL", "CKMB", "CKMBINDEX", "TROPONINI" in the last 168 hours. BNP (last 3 results) No results for input(s): "PROBNP" in the last 8760 hours. HbA1C: No results for input(s): "HGBA1C" in the last 72 hours. CBG: No results for input(s): "GLUCAP" in the  last 168 hours. Lipid Profile: No results for input(s): "CHOL", "HDL", "LDLCALC", "TRIG", "CHOLHDL", "LDLDIRECT" in the last 72 hours. Thyroid Function Tests: No results for input(s): "TSH", "T4TOTAL", "FREET4", "T3FREE", "THYROIDAB" in the last 72 hours. Anemia Panel: No results for input(s): "VITAMINB12", "FOLATE", "FERRITIN", "TIBC", "IRON", "RETICCTPCT" in the last 72 hours. Urine analysis:    Component Value Date/Time   COLORURINE YELLOW (A) 10/09/2016 1526   APPEARANCEUR CLEAR (A) 10/09/2016 1526   LABSPEC 1.014 10/09/2016 1526   PHURINE 6.0 10/09/2016 1526   GLUCOSEU NEGATIVE 10/09/2016 1526   HGBUR SMALL (A) 10/09/2016 1526   BILIRUBINUR NEGATIVE 10/09/2016 [place] 10/09/2016 1526   PROTEINUR NEGATIVE 10/09/2016 1526   NITRITE NEGATIVE 10/09/2016 [place] 10/09/2016 1526    Radiological Exams on Admission: DG Chest Port 1 View  Result Date: 10/15/2021 CLINICAL DATA:  Chest pain for several hours EXAM: PORTABLE CHEST 1 VIEW COMPARISON:  10/15/2016 FINDINGS: Cardiac shadow is enlarged but stable. Right chest wall port is noted in satisfactory position. Lungs are well aerated bilaterally. No focal confluent infiltrate is seen. Patchy sclerotic foci are noted throughout the bony structures consistent with metastatic disease. IMPRESSION: Changes consistent with bony metastatic disease. No acute infiltrate is seen. Electronically Signed   By: Inez Catalina M.D.   On: 10/15/2021 00:01     Data Reviewed: Relevant notes from primary care and specialist visits, past discharge summaries as available in EHR, including Care Everywhere. Prior diagnostic testing as pertinent to current admission diagnoses Updated medications and problem lists for reconciliation ED course, including vitals, labs, imaging, treatment and response to treatment Triage notes, nursing and pharmacy notes and ED provider's notes Notable results as noted in HPI   Assessment  and Plan: * Unstable angina ([place]) Has been following with cardiology with diagnosis of angina lll and was scheduled for cardiac cath on 7/6 Continue isosorbide, metoprolol, atorvastatin, Nitropaste Morphine as needed pain Keep n.p.o. for possible procedure in the a.m. Cardiology consult  PAD (peripheral artery disease) ([place]) Continue atorvastatin if  Essential hypertension Continue metoprolol, losartan, hydrochlorothiazide and amlodipine  Hypokalemia Oral repletion and monitor  Chronic anticoagulation On apixaban secondary to history of DVT 3 years prior with history of thrombectomy Continue apixaban  Breast cancer in female [facility]) Currently on Ibrance and letrozole and to receive Zometa, followed by Dr. Janese Banks      DVT prophylaxis: Lovenox  Consults: Cardiology, [facility], Dr. Rockey Situ  Advance Care Planning:   Code Status: Full Code   Family Communication: none  Disposition Plan: Back to previous home environment  Severity of Illness:.obs   Author: Athena Masse, MD 10/15/2021 1:24 AM  For on call review [email or web address].

## 2021-10-14 NOTE — Assessment & Plan Note (Signed)
Continue atorvastatin if

## 2021-10-14 NOTE — Assessment & Plan Note (Signed)
Has been following with cardiology with diagnosis of angina lll and was scheduled for cardiac cath on 7/6 Continue isosorbide, metoprolol, atorvastatin, Nitropaste Morphine as needed pain Keep n.p.o. for possible procedure in the a.m. Cardiology consult

## 2021-10-15 ENCOUNTER — Other Ambulatory Visit: Payer: Self-pay

## 2021-10-15 DIAGNOSIS — I214 Non-ST elevation (NSTEMI) myocardial infarction: Secondary | ICD-10-CM | POA: Diagnosis present

## 2021-10-15 DIAGNOSIS — Z8052 Family history of malignant neoplasm of bladder: Secondary | ICD-10-CM | POA: Diagnosis not present

## 2021-10-15 DIAGNOSIS — R079 Chest pain, unspecified: Secondary | ICD-10-CM | POA: Diagnosis present

## 2021-10-15 DIAGNOSIS — E876 Hypokalemia: Secondary | ICD-10-CM | POA: Diagnosis present

## 2021-10-15 DIAGNOSIS — Z801 Family history of malignant neoplasm of trachea, bronchus and lung: Secondary | ICD-10-CM | POA: Diagnosis not present

## 2021-10-15 DIAGNOSIS — C7951 Secondary malignant neoplasm of bone: Secondary | ICD-10-CM | POA: Diagnosis present

## 2021-10-15 DIAGNOSIS — Z8249 Family history of ischemic heart disease and other diseases of the circulatory system: Secondary | ICD-10-CM | POA: Diagnosis not present

## 2021-10-15 DIAGNOSIS — I10 Essential (primary) hypertension: Secondary | ICD-10-CM

## 2021-10-15 DIAGNOSIS — I200 Unstable angina: Secondary | ICD-10-CM | POA: Diagnosis not present

## 2021-10-15 DIAGNOSIS — I25110 Atherosclerotic heart disease of native coronary artery with unstable angina pectoris: Secondary | ICD-10-CM | POA: Diagnosis present

## 2021-10-15 DIAGNOSIS — Z86711 Personal history of pulmonary embolism: Secondary | ICD-10-CM | POA: Diagnosis not present

## 2021-10-15 DIAGNOSIS — Z7901 Long term (current) use of anticoagulants: Secondary | ICD-10-CM | POA: Diagnosis not present

## 2021-10-15 DIAGNOSIS — I2510 Atherosclerotic heart disease of native coronary artery without angina pectoris: Secondary | ICD-10-CM | POA: Diagnosis not present

## 2021-10-15 DIAGNOSIS — Z79811 Long term (current) use of aromatase inhibitors: Secondary | ICD-10-CM | POA: Diagnosis not present

## 2021-10-15 DIAGNOSIS — Z79899 Other long term (current) drug therapy: Secondary | ICD-10-CM | POA: Diagnosis not present

## 2021-10-15 DIAGNOSIS — Z86718 Personal history of other venous thrombosis and embolism: Secondary | ICD-10-CM | POA: Diagnosis not present

## 2021-10-15 DIAGNOSIS — Z853 Personal history of malignant neoplasm of breast: Secondary | ICD-10-CM | POA: Diagnosis not present

## 2021-10-15 DIAGNOSIS — Z885 Allergy status to narcotic agent status: Secondary | ICD-10-CM | POA: Diagnosis not present

## 2021-10-15 DIAGNOSIS — I739 Peripheral vascular disease, unspecified: Secondary | ICD-10-CM | POA: Diagnosis present

## 2021-10-15 DIAGNOSIS — Z820 Family history of epilepsy and other diseases of the nervous system: Secondary | ICD-10-CM | POA: Diagnosis not present

## 2021-10-15 DIAGNOSIS — I4510 Unspecified right bundle-branch block: Secondary | ICD-10-CM | POA: Diagnosis present

## 2021-10-15 LAB — CBC WITH DIFFERENTIAL/PLATELET
Abs Immature Granulocytes: 0.01 10*3/uL (ref 0.00–0.07)
Basophils Absolute: 0.1 10*3/uL (ref 0.0–0.1)
Basophils Relative: 2 %
Eosinophils Absolute: 0.1 10*3/uL (ref 0.0–0.5)
Eosinophils Relative: 3 %
HCT: 35.3 % — ABNORMAL LOW (ref 36.0–46.0)
Hemoglobin: 12.1 g/dL (ref 12.0–15.0)
Immature Granulocytes: 0 %
Lymphocytes Relative: 30 %
Lymphs Abs: 1 10*3/uL (ref 0.7–4.0)
MCH: 36.6 pg — ABNORMAL HIGH (ref 26.0–34.0)
MCHC: 34.3 g/dL (ref 30.0–36.0)
MCV: 106.6 fL — ABNORMAL HIGH (ref 80.0–100.0)
Monocytes Absolute: 0.2 10*3/uL (ref 0.1–1.0)
Monocytes Relative: 7 %
Neutro Abs: 1.9 10*3/uL (ref 1.7–7.7)
Neutrophils Relative %: 58 %
Platelets: 209 10*3/uL (ref 150–400)
RBC: 3.31 MIL/uL — ABNORMAL LOW (ref 3.87–5.11)
RDW: 15.3 % (ref 11.5–15.5)
WBC: 3.3 10*3/uL — ABNORMAL LOW (ref 4.0–10.5)
nRBC: 0 % (ref 0.0–0.2)

## 2021-10-15 LAB — COMPREHENSIVE METABOLIC PANEL
ALT: 27 U/L (ref 0–44)
AST: 40 U/L (ref 15–41)
Albumin: 4 g/dL (ref 3.5–5.0)
Alkaline Phosphatase: 77 U/L (ref 38–126)
Anion gap: 12 (ref 5–15)
BUN: 13 mg/dL (ref 8–23)
CO2: 23 mmol/L (ref 22–32)
Calcium: 9.8 mg/dL (ref 8.9–10.3)
Chloride: 105 mmol/L (ref 98–111)
Creatinine, Ser: 1.1 mg/dL — ABNORMAL HIGH (ref 0.44–1.00)
GFR, Estimated: 51 mL/min — ABNORMAL LOW (ref >60)
Glucose, Bld: 202 mg/dL — ABNORMAL HIGH (ref 70–99)
Potassium: 3.2 mmol/L — ABNORMAL LOW (ref 3.5–5.1)
Sodium: 140 mmol/L (ref 135–145)
Total Bilirubin: 1.2 mg/dL (ref 0.3–1.2)
Total Protein: 7.9 g/dL (ref 6.5–8.1)

## 2021-10-15 LAB — TROPONIN I (HIGH SENSITIVITY)
Troponin I (High Sensitivity): 19 ng/L — ABNORMAL HIGH (ref <18)
Troponin I (High Sensitivity): 680 ng/L (ref <18)
Troponin I (High Sensitivity): 607 ng/L (ref <18)

## 2021-10-15 LAB — LIPASE, BLOOD: Lipase: 36 U/L (ref 11–51)

## 2021-10-15 LAB — MRSA NEXT GEN BY PCR, NASAL: MRSA by PCR Next Gen: NOT DETECTED

## 2021-10-15 LAB — HEPARIN LEVEL (UNFRACTIONATED)
Heparin Unfractionated: 0.22 IU/mL — ABNORMAL LOW (ref 0.30–0.70)
Heparin Unfractionated: 0.59 IU/mL (ref 0.30–0.70)

## 2021-10-15 LAB — PROTIME-INR
INR: 1.1 (ref 0.8–1.2)
Prothrombin Time: 14.4 seconds (ref 11.4–15.2)

## 2021-10-15 LAB — GLUCOSE, CAPILLARY: Glucose-Capillary: 150 mg/dL — ABNORMAL HIGH (ref 70–99)

## 2021-10-15 LAB — APTT: aPTT: 29 seconds (ref 24–36)

## 2021-10-15 MED ORDER — ORAL CARE MOUTH RINSE: 15.0000 mL | OROMUCOSAL | Status: DC | PRN

## 2021-10-15 MED ORDER — LOSARTAN POTASSIUM 50 MG PO TABS
50.0000 mg | ORAL_TABLET | Freq: Every day | ORAL | Status: DC
  Administered 2021-10-15 – 2021-10-17 (×3): 50 mg via ORAL
  Filled 2021-10-15 (×3): qty 1

## 2021-10-15 MED ORDER — ACETAMINOPHEN 500 MG PO TABS: 500.0000 mg | ORAL_TABLET | Freq: Every day | ORAL | Status: DC | PRN

## 2021-10-15 MED ORDER — HEPARIN BOLUS VIA INFUSION
4000.0000 [IU] | Freq: Once | INTRAVENOUS | Status: AC
  Administered 2021-10-15: 4000 [IU] via INTRAVENOUS
  Filled 2021-10-15: qty 4000

## 2021-10-15 MED ORDER — ASPIRIN 300 MG RE SUPP: 300.0000 mg | RECTAL | Status: AC

## 2021-10-15 MED ORDER — ENOXAPARIN SODIUM 60 MG/0.6ML IJ SOSY
0.5000 mg/kg | PREFILLED_SYRINGE | INTRAMUSCULAR | Status: DC
Start: 2021-10-15 — End: 2021-10-15

## 2021-10-15 MED ORDER — PALBOCICLIB 75 MG PO TABS: 75.0000 mg | ORAL_TABLET | Freq: Every day | ORAL | Status: DC

## 2021-10-15 MED ORDER — ASPIRIN 81 MG PO CHEW: 324.0000 mg | CHEWABLE_TABLET | ORAL | Status: AC

## 2021-10-15 MED ORDER — HEPARIN (PORCINE) 25000 UT/250ML-% IV SOLN
850.0000 [IU]/h | INTRAVENOUS | Status: DC
  Administered 2021-10-15 – 2021-10-16 (×2): 850 [IU]/h via INTRAVENOUS
  Filled 2021-10-15 (×2): qty 250

## 2021-10-15 MED ORDER — METOPROLOL SUCCINATE ER 50 MG PO TB24
25.0000 mg | ORAL_TABLET | Freq: Every day | ORAL | Status: DC
  Administered 2021-10-15 – 2021-10-16 (×2): 25 mg via ORAL
  Filled 2021-10-15 (×4): qty 1

## 2021-10-15 MED ORDER — ATORVASTATIN CALCIUM 20 MG PO TABS
40.0000 mg | ORAL_TABLET | Freq: Every day | ORAL | Status: DC
Start: 2021-10-15 — End: 2021-10-17
  Administered 2021-10-15 – 2021-10-16 (×3): 40 mg via ORAL
  Filled 2021-10-15 (×3): qty 2

## 2021-10-15 MED ORDER — AMLODIPINE BESYLATE 10 MG PO TABS
10.0000 mg | ORAL_TABLET | Freq: Every day | ORAL | Status: DC
  Administered 2021-10-15 – 2021-10-16 (×2): 10 mg via ORAL
  Filled 2021-10-15: qty 1
  Filled 2021-10-15: qty 2
  Filled 2021-10-15: qty 1

## 2021-10-15 MED ORDER — POTASSIUM CHLORIDE CRYS ER 20 MEQ PO TBCR
40.0000 meq | EXTENDED_RELEASE_TABLET | Freq: Once | ORAL | Status: AC
Start: 2021-10-15 — End: 2021-10-15
  Administered 2021-10-15: 40 meq via ORAL
  Filled 2021-10-15: qty 2

## 2021-10-15 MED ORDER — ONDANSETRON HCL 4 MG/2ML IJ SOLN: 4.0000 mg | Freq: Four times a day (QID) | INTRAMUSCULAR | Status: DC | PRN

## 2021-10-15 MED ORDER — ISOSORBIDE MONONITRATE ER 30 MG PO TB24
30.0000 mg | ORAL_TABLET | Freq: Every day | ORAL | Status: DC
  Administered 2021-10-15 – 2021-10-17 (×3): 30 mg via ORAL
  Filled 2021-10-15 (×3): qty 1

## 2021-10-15 MED ORDER — LETROZOLE 2.5 MG PO TABS
2.5000 mg | ORAL_TABLET | Freq: Every day | ORAL | Status: DC
  Administered 2021-10-15 – 2021-10-17 (×3): 2.5 mg via ORAL
  Filled 2021-10-15 (×3): qty 1

## 2021-10-15 MED ORDER — ASPIRIN 81 MG PO TBEC
81.0000 mg | DELAYED_RELEASE_TABLET | Freq: Every day | ORAL | Status: DC
  Administered 2021-10-16: 81 mg via ORAL
  Filled 2021-10-15: qty 1

## 2021-10-15 MED ORDER — CHLORHEXIDINE GLUCONATE CLOTH 2 % EX PADS
6.0000 | MEDICATED_PAD | Freq: Every day | CUTANEOUS | Status: DC
  Administered 2021-10-15 – 2021-10-17 (×3): 6 via TOPICAL

## 2021-10-15 MED ORDER — NITROGLYCERIN 0.4 MG SL SUBL
0.4000 mg | SUBLINGUAL_TABLET | SUBLINGUAL | Status: DC | PRN
  Administered 2021-10-15 (×2): 0.4 mg via SUBLINGUAL
  Filled 2021-10-15 (×2): qty 1

## 2021-10-15 MED ORDER — ACETAMINOPHEN 325 MG PO TABS
650.0000 mg | ORAL_TABLET | ORAL | Status: DC | PRN
  Administered 2021-10-16: 650 mg via ORAL
  Filled 2021-10-15: qty 2

## 2021-10-15 NOTE — Progress Notes (Signed)
       CROSS COVER NOTE  NAME: Victoria Steele MRN: [id number] DOB : 07/07/1940    Date of Service   10/15/21  HPI/Events of Note   Notified of critical Troponin by nursing. Trop 19-->680. [name] presented to [facility] ED 10/14/21 with reports of chest pain and has been followed outpatient for unstable angina. Was previously scheduled for outpatient (L) heart cath 10/16/21.   Interventions   Plan: Heparin per pharmacy Trend Troponin       This document was prepared using Dragon voice recognition software and may include unintentional dictation errors.  Neomia Glass DNP, MHA, FNP-BC Nurse Practitioner [facility] Pager [phone]

## 2021-10-15 NOTE — ED Notes (Signed)
Pt assisted to the toilet to void, pt ambulated steady without difficulty. Dr Sheppard Coil in to see pt

## 2021-10-15 NOTE — ED Notes (Signed)
Pt sitting upright, alert and oriented x4, pt's family member at bedside, pt under the impression she may have a cardiac cath today, pt reassured that if this RN heard anything about the cath that I would let her know, pt denies any issues at this time and the call bell is within reach of the pt and pt states that she doesn't need anything at this time

## 2021-10-15 NOTE — ED Notes (Signed)
Pt resting quietly with her eyes closed, husband at bedside, resp even and unlabored

## 2021-10-15 NOTE — Progress Notes (Signed)
Anticoagulation monitoring(Lovenox):  81 yo female ordered Lovenox 40 mg Q24h    Filed Weights   10/14/21 2249  Weight: 90.7 kg (200 lb)   BMI 36.6    Lab Results  Component Value Date   CREATININE 0.99 10/10/2021   CREATININE 0.90 10/06/2021   CREATININE 1.18 (H) 08/28/2021   Estimated Creatinine Clearance: 49 mL/min (by C-G formula based on SCr of 0.99 mg/dL). Hemoglobin & Hematocrit     Component Value Date/Time   HGB 12.1 10/14/2021 2247   HCT 35.3 (L) 10/14/2021 2247     Per Protocol for Patient with estCrcl > 30 ml/min and BMI > 30, will transition to Lovenox 45 mg Q24h.

## 2021-10-15 NOTE — Progress Notes (Signed)
ANTICOAGULATION CONSULT NOTE - Initial Consult  Pharmacy Consult for  Heparin  Indication: chest pain/ACS  Allergies  Allergen Reactions   Codeine Rash    hallucinations    Patient Measurements: Height: '5\' 2"'$  (157.5 cm) Weight: 90.7 kg (200 lb) IBW/kg (Calculated) : 50.1 Heparin Dosing Weight: 71.1 kg   Vital Signs: Temp: 98.6 F (37 C) (07/04 2248) Temp Source: Oral (07/04 2248) BP: 125/49 (07/05 0600) Pulse Rate: 61 (07/05 0600)  Labs: Recent Labs    10/14/21 2247 10/15/21 0510  HGB 12.1  --   HCT 35.3*  --   PLT 209  --   CREATININE 1.10*  --   TROPONINIHS 19* 680*    Estimated Creatinine Clearance: 44.1 mL/min (A) (by C-G formula based on SCr of 1.1 mg/dL (H)).   Medical History: Past Medical History:  Diagnosis Date   Breast cancer ([place])    Cancer ([place])    left breast   Coronary artery disease 08/30/2021   Coronary Calcium Score 788.  Combination of calcified and noncalcified plaque-severe lesion in mid RCA (estimated greater than 70%).  Moderate LAD and LCx 50 to 69%). => CAD RADS 4 with severe stenosis suggested in the RCA.  FFRCT not submitted due to motion artifact.-Recommended cardiac catheterization.   DVT (deep venous thrombosis) ([place])    2019   Hypertension    Port catheter in place 09/14/2016   Placed 09/10/2016 RT chest.    Medications:  (Not in a hospital admission)   Assessment: Pharmacy consulted to dose heparin in this 81 year old female admitted with ACS/NSTEMI.  Pt was on Eliquis 5 mg PO BID PTA, last dose was last week.   CrCl = 44.1 ml/min  Goal of Therapy:  Heparin level 0.3-0.7 units/ml Monitor platelets by anticoagulation protocol: Yes   Plan:  Will order baseline HL to ensure pt has cleared prior Eliquis dose.  Give 4000 units bolus x 1 Start heparin infusion at 850 units/hr Check anti-Xa level in 8 hours and daily while on heparin Continue to monitor H&H and platelets  Loletta Harper D 10/15/2021,6:27 AM

## 2021-10-15 NOTE — Addendum Note (Signed)
Addended by: Anselm Pancoast on: 10/15/2021 11:26 AM   Modules accepted: Orders

## 2021-10-15 NOTE — Progress Notes (Signed)
PROGRESS NOTE    Victoria Steele  YYT:[id number] DOB: 1941-03-24  DOA: 10/14/2021 Date of Service: 10/15/21 PCP: Letta Median, MD     Brief Narrative / Hospital Course:   Victoria Steele is a 81 y.o. 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  She had no associated nausea, vomiting or diaphoresis.  Denies shortness of breath or lower extremity pain or swelling.  Patient had been off her apixaban for 2 days in preparation for her upcoming procedure.  07/04: In ED BP 175/49 with otherwise normal vitals. Labs:First troponin 19. EKG without acute ST-T wave changes. CXR bony metastatic disease w/o acute infiltrate. Tx:  with an inch of Nitropaste with improvement in pain from 8 out of 10 to 2 out of 10.  Hospitalist consulted for admission.  Continued isosorbide, metoprolol, atorvastatin, Nitropaste. Consult to cardiology  07/05: Troponin 19 on admission --> 680 at 05:10 --(started heparin gtt)--> 744 at 06:41 --> 607 at 09:48   Consultants:  Cardiology   Procedures: none    Subjective: Patient reports feeling okay, nitro is helping chest pain, no CP/SOB at this time.      ASSESSMENT & PLAN:   Principal Problem:   Unstable angina (HCC) Active Problems:   Essential hypertension   PAD (peripheral artery disease) (HCC)   Hypokalemia   Breast cancer in female [facility])   Chronic anticoagulation  Unstable angina ([place]) NSTEMI Has been following with cardiology with diagnosis of angina lll and was scheduled for cardiac cath on 7/6 Continue isosorbide, metoprolol, atorvastatin, Nitropaste Morphine as needed pain Keep n.p.o. for possible procedure in the a.m. Cardiology recs: plan L heart cath tomorrow 10/16/21  Breast cancer in  female [facility]) Currently on Ibrance and letrozole and to receive Zometa, followed by Dr. Janese Banks  PAD (peripheral artery disease) ([place]) Continue atorvastatin  Essential hypertension Continue metoprolol, losartan, amlodipine Holding home HCTZ  Chronic anticoagulation On apixaban secondary to history of DVT 3 years prior with history of thrombectomy Continue apixaban  Hypokalemia Oral repletion and monitor    DVT prophylaxis: on heparin gtt for rising troponin / NSTEMI Code Status: FULL Family Communication: husband at bedside on rounds  Disposition Plan / TOC needs: order placed today to switch from OBS to INPT, remains on progresive Barriers to discharge / significant pending items: cardiology planning cath tomorrow, remains on IV heparin gtt              Objective: Vitals:   10/15/21 0400 10/15/21 0500 10/15/21 0600 10/15/21 0700  BP: (!) 116/50 (!) 118/49 (!) 125/49 (!) 137/51  Pulse: (!) 57 (!) 56 61 67  Resp: 18 20 (!) 28 16  Temp:      TempSrc:      SpO2: 96% 94% 96% 98%  Weight:      Height:       No intake or output data in the 24 hours ending 10/15/21 0751 Filed Weights   10/14/21 2249  Weight: 90.7 kg    Examination:  Constitutional:  VS as above General Appearance: alert, well-developed, well-nourished, NAD Eyes: Normal lids and conjunctive, non-icteric sclera Ears, Nose, Mouth, Throat: Normal appearance Neck: No masses, trachea midline Respiratory: Normal respiratory effort Breath sounds normal, no wheeze/rhonchi/rales Cardiovascular: S1/S2 normal, no murmur/rub/gallop  auscultated Trace lower extremity edema Gastrointestinal: Nontender, no masses Musculoskeletal:  No clubbing/cyanosis of digits Neurological: No cranial nerve deficit on limited exam Psychiatric: Normal judgment/insight Normal mood and affect       Scheduled Medications:   amLODipine  10 mg Oral Daily   aspirin  324 mg Oral NOW   Or   aspirin  300 mg Rectal  NOW   [START ON 10/16/2021] aspirin EC  81 mg Oral Daily   atorvastatin  40 mg Oral QHS   isosorbide mononitrate  30 mg Oral Daily   letrozole  2.5 mg Oral Daily   losartan  50 mg Oral Daily   metoprolol succinate  25 mg Oral Daily   palbociclib  75 mg Oral Daily   sodium chloride flush  3 mL Intravenous Q12H    Continuous Infusions:  heparin 850 Units/hr (10/15/21 0651)    PRN Medications:  acetaminophen, nitroGLYCERIN, ondansetron (ZOFRAN) IV  Antimicrobials:  Anti-infectives (From admission, onward)    None       Data Reviewed: I have personally reviewed following labs and imaging studies  CBC: Recent Labs  Lab 10/10/21 1324 10/14/21 2247  WBC 2.4* 3.3*  NEUTROABS 1.2* 1.9  HGB 11.9* 12.1  HCT 33.5* 35.3*  MCV 106.7* 106.6*  PLT 163 935   Basic Metabolic Panel: Recent Labs  Lab 10/10/21 1324 10/14/21 2247  NA 138 140  K 3.5 3.2*  CL 103 105  CO2 26 23  GLUCOSE 192* 202*  BUN 14 13  CREATININE 0.99 1.10*  CALCIUM 9.5 9.8   GFR: Estimated Creatinine Clearance: 44.1 mL/min (A) (by C-G formula based on SCr of 1.1 mg/dL (H)). Liver Function Tests: Recent Labs  Lab 10/10/21 1324 10/14/21 2247  AST 35 40  ALT 24 27  ALKPHOS 55 77  BILITOT 1.4* 1.2  PROT 7.0 7.9  ALBUMIN 3.6 4.0   Recent Labs  Lab 10/14/21 2247  LIPASE 36   No results for input(s): "AMMONIA" in the last 168 hours. Coagulation Profile: Recent Labs  Lab 10/15/21 0641  INR 1.1   Cardiac Enzymes: No results for input(s): "CKTOTAL", "CKMB", "CKMBINDEX", "TROPONINI" in the last 168 hours. BNP (last 3 results) No results for input(s): "PROBNP" in the last 8760 hours. HbA1C: No results for input(s): "HGBA1C" in the last 72 hours. CBG: No results for input(s): "GLUCAP" in the last 168 hours. Lipid Profile: No results for input(s): "CHOL", "HDL", "LDLCALC", "TRIG", "CHOLHDL", "LDLDIRECT" in the last 72 hours. Thyroid Function Tests: No results for input(s): "TSH", "T4TOTAL",  "FREET4", "T3FREE", "THYROIDAB" in the last 72 hours. Anemia Panel: No results for input(s): "VITAMINB12", "FOLATE", "FERRITIN", "TIBC", "IRON", "RETICCTPCT" in the last 72 hours. Urine analysis:    Component Value Date/Time   COLORURINE YELLOW (A) 10/09/2016 1526   APPEARANCEUR CLEAR (A) 10/09/2016 1526   LABSPEC 1.014 10/09/2016 1526   PHURINE 6.0 10/09/2016 1526   GLUCOSEU NEGATIVE 10/09/2016 1526   HGBUR SMALL (A) 10/09/2016 1526   BILIRUBINUR NEGATIVE 10/09/2016 1526   KETONESUR NEGATIVE 10/09/2016 1526   PROTEINUR NEGATIVE 10/09/2016 1526   NITRITE NEGATIVE 10/09/2016 1526   LEUKOCYTESUR NEGATIVE 10/09/2016 1526   Sepsis Labs: '@LABRCNTIP'$ (procalcitonin:4,lacticidven:4)  No results found for this or any previous visit (from the past 240 hour(s)).       Radiology Studies last 96 hours: DG Chest Port 1 View  Result Date: 10/15/2021 CLINICAL DATA:  Chest pain for several hours EXAM: PORTABLE CHEST 1 VIEW COMPARISON:  10/15/2016 FINDINGS: Cardiac shadow is enlarged but stable.  Right chest wall port is noted in satisfactory position. Lungs are well aerated bilaterally. No focal confluent infiltrate is seen. Patchy sclerotic foci are noted throughout the bony structures consistent with metastatic disease. IMPRESSION: Changes consistent with bony metastatic disease. No acute infiltrate is seen. Electronically Signed   By: Inez Catalina M.D.   On: 10/15/2021 00:01            LOS: 0 days      Emeterio Reeve, DO [facility] 10/15/2021, 7:51 AM   Staff may message me via secure chat in [place]  but this may not receive immediate response,  please page for urgent matters!  If 7PM-7AM, please contact night-coverage [email or web address]  Dictation software was used to generate the above note. Typos may occur and escape review, as with typed/written notes. Please contact Dr Sheppard Coil directly for clarity if needed.

## 2021-10-15 NOTE — Consult Note (Signed)
Cardiology Consultation:   Patient ID: Victoria Steele MRN: [id number]; DOB: 19-Jan-1941  Admit date: 10/14/2021 Date of Consult: 10/15/2021  PCP:  Letta Median, MD   [facility] Providers Cardiologist:  Glenetta Hew, MD        Patient Profile:   Victoria Steele is a 81 y.o. female with a hx of CAD, hypertension who is being seen 10/15/2021 for the evaluation of chest pain at the request of Dr. Damita Dunnings.  History of Present Illness:   Victoria Steele is a 81 year old female with history of hypertension, CAD (significant RCA stenosis on CCTA), prior PE presenting with chest pain.  Patient has a history of chest pain, evaluated on outpatient basis with coronary CTA revealing significant RCA stenosis.  Left heart cath and possible intervention was planned for tomorrow, yesterday she developed sharp chest pain that did not resolve, prompting her to come to the emergency room.  In the ED, she was given and Nitropaste with improvement in symptoms.  Initial EKG showed sinus rhythm with right bundle branch block, troponins 680, 744, 607.  Patient started on heparin drip per ACS protocol.   Past Medical History:  Diagnosis Date   Breast cancer ([place])    Cancer ([place])    left breast   Coronary artery disease 08/30/2021   Coronary Calcium Score 788.  Combination of calcified and noncalcified plaque-severe lesion in mid RCA (estimated greater than 70%).  Moderate LAD and LCx 50 to 69%). => CAD RADS 4 with severe stenosis suggested in the RCA.  FFRCT not submitted due to motion artifact.-Recommended cardiac catheterization.   DVT (deep venous thrombosis) ([place])    2019   Hypertension    Port catheter in place 09/14/2016   Placed 09/10/2016 RT chest.    Past Surgical History:  Procedure Laterality Date   ABDOMINAL HYSTERECTOMY     APPENDECTOMY     BREAST BIOPSY     CT CTA CORONARY W/CA SCORE W/CM &/OR WO/CM  09/01/2021   Coronary Calcium Score 788.  Combination of calcified and noncalcified  plaque-severe lesion in mid RCA (estimated greater than 70%).  Moderate LAD and LCx 50 to 69%). => CAD RADS 4 with severe stenosis suggested in the RCA.  FFRCT not submitted due to motion artifact.-Recommended cardiac catheterization.   CYST EXCISION Right 09/2016   Buttocks   IVC FILTER REMOVAL N/A 01/17/2018   Procedure: IVC FILTER REMOVAL;  Surgeon: Algernon Huxley, MD;  Location: [place] [facility];  Service: Cardiovascular;  Laterality: N/A;   LOWER EXTREMITY ANGIOGRAPHY Right 11/11/2017   Procedure: LOWER EXTREMITY ANGIOGRAPHY;  Surgeon: Katha Cabal, MD;  Location: [place] [facility];  Service: Cardiovascular;  Laterality: Right;   PERIPHERAL VASCULAR THROMBECTOMY Right 08/30/2017   Procedure: PERIPHERAL VASCULAR THROMBECTOMY;  Surgeon: Algernon Huxley, MD;  Location: [place] [facility];  Service: Cardiovascular;  Laterality: Right;   PORTACATH PLACEMENT Right 09/10/2016   Procedure: INSERTION PORT-A-CATH;  Surgeon: Jules Husbands, MD;  Location: [facility];  Service: General;  Laterality: Right;     Home Medications:  Prior to Admission medications   Medication Sig Start Date End Date Taking? Authorizing Provider  amLODipine (NORVASC) 10 MG tablet Take 10 mg by mouth daily.   Yes [provider]  apixaban (ELIQUIS) 5 MG TABS tablet Take 1 tablet (5 mg total) by mouth 2 (two) times daily. 06/16/21  Yes Sindy Guadeloupe, MD  atorvastatin (LIPITOR) 40 MG tablet Take 40 mg by mouth at bedtime. 03/17/21  Yes [provider]  Calcium Carb-Cholecalciferol (CALCIUM 600/VITAMIN D3 PO) Take 1 tablet by mouth daily.   Yes [provider]  hydrochlorothiazide (HYDRODIURIL) 12.5 MG tablet Take 12.5 mg by mouth daily. 03/26/20  Yes [provider]  isosorbide mononitrate (IMDUR) 30 MG 24 hr tablet Take 1 tablet (30 mg total) by mouth daily. 09/11/21  Yes Leonie Man, MD  letrozole [facility]) 2.5 MG tablet Take 1 tablet (2.5 mg total) by mouth daily. 09/09/21  Yes  Sindy Guadeloupe, MD  losartan (COZAAR) 50 MG tablet Take 1 tablet by mouth once a day  for high blood pressure- to replace 100 mg 03/26/20  Yes [provider]  metoprolol succinate (TOPROL XL) 25 MG 24 hr tablet Take 1 tablet (25 mg total) by mouth daily. 09/11/21  Yes Leonie Man, MD  palbociclib [facility]) 75 MG tablet Take 1 tablet (75 mg total) by mouth daily. Take for 21 days on, 7 days off, repeat every 28 days. 08/04/21  Yes Sindy Guadeloupe, MD  acetaminophen (TYLENOL) 500 MG tablet Take 500 mg by mouth daily as needed for moderate pain or headache.     [provider]  ibuprofen (ADVIL,MOTRIN) 400 MG tablet Take 400 mg by mouth 3 (three) times daily as needed. 05/05/18   [provider]  lidocaine-prilocaine (EMLA) cream Apply 1 application. topically as needed (port access). 07/09/21   Sindy Guadeloupe, MD  PREVIDENT 5000 BOOSTER PLUS 1.1 % PSTE Place 1 application onto teeth 2 (two) times daily.  Patient not taking: Reported on 10/09/2021 12/25/16   [provider]  prochlorperazine (COMPAZINE) 10 MG tablet TAKE 1 TABLET BY MOUTH EVERY 6 HOURS AS NEEDED FOR NAUSEA AND VOMITING Patient not taking: Reported on 10/10/2021 06/13/20   Jacquelin Hawking, NP    Inpatient Medications: Scheduled Meds:  amLODipine  10 mg Oral Daily   aspirin  324 mg Oral NOW   Or   aspirin  300 mg Rectal NOW   [START ON 10/16/2021] aspirin EC  81 mg Oral Daily   atorvastatin  40 mg Oral QHS   isosorbide mononitrate  30 mg Oral Daily   letrozole  2.5 mg Oral Daily   losartan  50 mg Oral Daily   metoprolol succinate  25 mg Oral Daily   palbociclib  75 mg Oral Daily   sodium chloride flush  3 mL Intravenous Q12H   Continuous Infusions:  heparin 850 Units/hr (10/15/21 0651)   PRN Meds: acetaminophen, nitroGLYCERIN, ondansetron (ZOFRAN) IV  Allergies:    Allergies  Allergen Reactions   Codeine Rash    hallucinations    Social History:   Social History   Socioeconomic  History   Marital status: Married    Spouse name: Jeneen Rinks   Number of children: 2   Years of education: 6   Highest education level: Bachelor's degree (e.g., BA, AB, BS)  Occupational History   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[employer]  Tobacco Use   Smoking status: Never   Smokeless tobacco: Never  Vaping Use   Vaping Use: Never used  Substance and Sexual Activity   Alcohol use: No   Drug use: No   Sexual activity: Never  Other Topics Concern   Not on file  Social History Narrative   Not on file   Social Determinants of Health   Financial Resource Strain: Low Risk  (04/05/2017)   Overall Financial Resource Strain (CARDIA)    Difficulty  of Paying Living Expenses: 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  Food Insecurity: No Food Insecurity (04/05/2017)   Hunger Vital Sign    Worried About Running Out of Food in the Last Year: Never true    Ran Out of Food in the Last Year: Never true  Transportation Needs: No Transportation Needs (04/05/2017)   PRAPARE - Hydrologist (Medical): No    Lack of Transportation (Non-Medical): No  Physical Activity: Unknown (04/05/2017)   Exercise Vital Sign    Days of Exercise per Week: Patient refused    Minutes of Exercise per Session: Patient refused  Stress: No Stress Concern Present (04/05/2017)   [place]    Feeling of Stress : Not at all  Social Connections: Unknown (04/05/2017)   Social Connection and Isolation Panel [NHANES]    Frequency of Communication with Friends and Family: More than three times a week    Frequency of Social Gatherings with Friends and Family: Once a week    Attends Religious Services: More than 4 times per year    Active Member of [employer] or Organizations: No    Attends Music therapist: Never    Marital Status: Not on file  Intimate Partner Violence: Not on file    Family History:    Family History   Problem Relation Age of Onset   Parkinson's disease Mother    Heart disease Father    Bladder Cancer Father    Lung cancer Father    Heart disease Maternal Grandfather      ROS:  Please see the history of present illness.   All other ROS reviewed and negative.     Physical Exam/Data:   Vitals:   10/15/21 0600 10/15/21 0700 10/15/21 1143 10/15/21 1144  BP: (!) 125/49 (!) 137/51 (!) 128/49 (!) 128/49  Pulse: 61 67 68   Resp: (!) 28 16    Temp:      TempSrc:      SpO2: 96% 98%    Weight:      Height:       No intake or output data in the 24 hours ending 10/15/21 1440    10/14/2021   10:49 PM 10/10/2021    1:28 PM 10/09/2021   10:39 AM  Last 3 Weights  Weight (lbs) 200 lb 199 lb 8 oz 200 lb 2 oz  Weight (kg) 90.719 kg 90.493 kg 90.776 kg     Body mass index is 36.58 kg/m.  General:  Well nourished, well developed, in no acute distress HEENT: normal Neck: no JVD Vascular: No carotid bruits; Distal pulses 2+ bilaterally Cardiac:  normal S1, S2; RRR; no murmur  Lungs:  clear to auscultation bilaterally, no wheezing, rhonchi or rales  Abd: soft, nontender, no hepatomegaly  Ext: no edema Musculoskeletal:  No deformities, BUE and BLE strength normal and equal Skin: warm and dry  Neuro:  CNs 2-12 intact, no focal abnormalities noted Psych:  Normal affect   EKG:  The EKG was personally reviewed and demonstrates: Sinus rhythm Telemetry:  Telemetry was personally reviewed and demonstrates: Sinus rhythm  Relevant CV Studies:  TTE 10/01/2021 1. Left ventricular ejection fraction, by estimation, is 60 to 65%. The  left ventricle has normal function. The left ventricle has no regional  wall motion abnormalities. Left ventricular diastolic parameters are  consistent with Grade I diastolic  dysfunction (impaired relaxation).   2. Right ventricular systolic function is normal. The right  ventricular  size is normal.   3. The mitral valve is normal in structure. No evidence of  mitral valve  regurgitation.   4. The aortic valve is normal in structure. Aortic valve regurgitation is  not visualized.   Laboratory Data:  High Sensitivity Troponin:   Recent Labs  Lab 10/14/21 2247 10/15/21 0510 10/15/21 0641 10/15/21 0948  TROPONINIHS 19* 680* 744* 607*     Chemistry Recent Labs  Lab 10/10/21 1324 10/14/21 2247  NA 138 140  K 3.5 3.2*  CL 103 105  CO2 26 23  GLUCOSE 192* 202*  BUN 14 13  CREATININE 0.99 1.10*  CALCIUM 9.5 9.8  GFRNONAA 58* 51*  ANIONGAP 9 12    Recent Labs  Lab 10/10/21 1324 10/14/21 2247  PROT 7.0 7.9  ALBUMIN 3.6 4.0  AST 35 40  ALT 24 27  ALKPHOS 55 77  BILITOT 1.4* 1.2   Lipids No results for input(s): "CHOL", "TRIG", "HDL", "LABVLDL", "LDLCALC", "CHOLHDL" in the last 168 hours.  Hematology Recent Labs  Lab 10/10/21 1324 10/14/21 2247  WBC 2.4* 3.3*  RBC 3.14* 3.31*  HGB 11.9* 12.1  HCT 33.5* 35.3*  MCV 106.7* 106.6*  MCH 37.9* 36.6*  MCHC 35.5 34.3  RDW 15.5 15.3  PLT 163 209   Thyroid No results for input(s): "TSH", "FREET4" in the last 168 hours.  BNPNo results for input(s): "BNP", "PROBNP" in the last 168 hours.  DDimer No results for input(s): "DDIMER" in the last 168 hours.   Radiology/Studies:  DG Chest Port 1 View  Result Date: 10/15/2021 CLINICAL DATA:  Chest pain for several hours EXAM: PORTABLE CHEST 1 VIEW COMPARISON:  10/15/2016 FINDINGS: Cardiac shadow is enlarged but stable. Right chest wall port is noted in satisfactory position. Lungs are well aerated bilaterally. No focal confluent infiltrate is seen. Patchy sclerotic foci are noted throughout the bony structures consistent with metastatic disease. IMPRESSION: Changes consistent with bony metastatic disease. No acute infiltrate is seen. Electronically Signed   By: Inez Catalina M.D.   On: 10/15/2021 00:01     Assessment and Plan:   Chest pain, NSTEMI, -Currently denies chest pain -Coronary CTA as outpatient with significant RCA  stenosis -Continue heparin drip, aspirin, Lipitor -Echo 2 weeks ago with EF 60 to 65% -Plan left heart cath tomorrow, n.p.o. midnight  2.  Hypertension -Blood pressure control -Losartan, Imdur, Norvasc   Total encounter time more than 85 minutes  Greater than 50% was spent in counseling and coordination of care with the patient  Signed, Kate Sable, MD  10/15/2021 2:40 PM

## 2021-10-15 NOTE — H&P (View-Only) (Signed)
Cardiology Consultation:   Patient ID: ALIVYA WEGMAN MRN: [id number]; DOB: 06/20/1940  Admit date: 10/14/2021 Date of Consult: 10/15/2021  PCP:  Victoria Median, MD   [facility] Providers Cardiologist:  Glenetta Hew, MD        Patient Profile:   Victoria Steele is a 81 y.o. female with a hx of CAD, hypertension who is being seen 10/15/2021 for the evaluation of chest pain at the request of Dr. Damita Dunnings.  History of Present Illness:   [name] is a 81 year old female with history of hypertension, CAD (significant RCA stenosis on CCTA), prior PE presenting with chest pain.  Patient has a history of chest pain, evaluated on outpatient basis with coronary CTA revealing significant RCA stenosis.  Left heart cath and possible intervention was planned for tomorrow, yesterday she developed sharp chest pain that did not resolve, prompting her to come to the emergency room.  In the ED, she was given and Nitropaste with improvement in symptoms.  Initial EKG showed sinus rhythm with right bundle branch block, troponins 680, 744, 607.  Patient started on heparin drip per ACS protocol.   Past Medical History:  Diagnosis Date   Breast cancer ([place])    Cancer ([place])    left breast   Coronary artery disease 08/30/2021   Coronary Calcium Score 788.  Combination of calcified and noncalcified plaque-severe lesion in mid RCA (estimated greater than 70%).  Moderate LAD and LCx 50 to 69%). => CAD RADS 4 with severe stenosis suggested in the RCA.  FFRCT not submitted due to motion artifact.-Recommended cardiac catheterization.   DVT (deep venous thrombosis) ([place])    2019   Hypertension    Port catheter in place 09/14/2016   Placed 09/10/2016 RT chest.    Past Surgical History:  Procedure Laterality Date   ABDOMINAL HYSTERECTOMY     APPENDECTOMY     BREAST BIOPSY     CT CTA CORONARY W/CA SCORE W/CM &/OR WO/CM  09/01/2021   Coronary Calcium Score 788.  Combination of calcified and noncalcified  plaque-severe lesion in mid RCA (estimated greater than 70%).  Moderate LAD and LCx 50 to 69%). => CAD RADS 4 with severe stenosis suggested in the RCA.  FFRCT not submitted due to motion artifact.-Recommended cardiac catheterization.   CYST EXCISION Right 09/2016   Buttocks   IVC FILTER REMOVAL N/A 01/17/2018   Procedure: IVC FILTER REMOVAL;  Surgeon: Algernon Huxley, MD;  Location: [place] [facility];  Service: Cardiovascular;  Laterality: N/A;   LOWER EXTREMITY ANGIOGRAPHY Right 11/11/2017   Procedure: LOWER EXTREMITY ANGIOGRAPHY;  Surgeon: Katha Cabal, MD;  Location: [place] [facility];  Service: Cardiovascular;  Laterality: Right;   PERIPHERAL VASCULAR THROMBECTOMY Right 08/30/2017   Procedure: PERIPHERAL VASCULAR THROMBECTOMY;  Surgeon: Algernon Huxley, MD;  Location: [place] [facility];  Service: Cardiovascular;  Laterality: Right;   PORTACATH PLACEMENT Right 09/10/2016   Procedure: INSERTION PORT-A-CATH;  Surgeon: Jules Husbands, MD;  Location: [facility];  Service: General;  Laterality: Right;     Home Medications:  Prior to Admission medications   Medication Sig Start Date End Date Taking? Authorizing Provider  amLODipine (NORVASC) 10 MG tablet Take 10 mg by mouth daily.   Yes [provider]  apixaban (ELIQUIS) 5 MG TABS tablet Take 1 tablet (5 mg total) by mouth 2 (two) times daily. 06/16/21  Yes Sindy Guadeloupe, MD  atorvastatin (LIPITOR) 40 MG tablet Take 40 mg by mouth at bedtime. 03/17/21  Yes [provider]  Calcium Carb-Cholecalciferol (CALCIUM 600/VITAMIN D3 PO) Take 1 tablet by mouth daily.   Yes [provider]  hydrochlorothiazide (HYDRODIURIL) 12.5 MG tablet Take 12.5 mg by mouth daily. 03/26/20  Yes [provider]  isosorbide mononitrate (IMDUR) 30 MG 24 hr tablet Take 1 tablet (30 mg total) by mouth daily. 09/11/21  Yes Leonie Man, MD  letrozole [facility]) 2.5 MG tablet Take 1 tablet (2.5 mg total) by mouth daily. 09/09/21  Yes  Sindy Guadeloupe, MD  losartan (COZAAR) 50 MG tablet Take 1 tablet by mouth once a day  for high blood pressure- to replace 100 mg 03/26/20  Yes [provider]  metoprolol succinate (TOPROL XL) 25 MG 24 hr tablet Take 1 tablet (25 mg total) by mouth daily. 09/11/21  Yes Leonie Man, MD  palbociclib [facility]) 75 MG tablet Take 1 tablet (75 mg total) by mouth daily. Take for 21 days on, 7 days off, repeat every 28 days. 08/04/21  Yes Sindy Guadeloupe, MD  acetaminophen (TYLENOL) 500 MG tablet Take 500 mg by mouth daily as needed for moderate pain or headache.     [provider]  ibuprofen (ADVIL,MOTRIN) 400 MG tablet Take 400 mg by mouth 3 (three) times daily as needed. 05/05/18   [provider]  lidocaine-prilocaine (EMLA) cream Apply 1 application. topically as needed (port access). 07/09/21   Sindy Guadeloupe, MD  PREVIDENT 5000 BOOSTER PLUS 1.1 % PSTE Place 1 application onto teeth 2 (two) times daily.  Patient not taking: Reported on 10/09/2021 12/25/16   [provider]  prochlorperazine (COMPAZINE) 10 MG tablet TAKE 1 TABLET BY MOUTH EVERY 6 HOURS AS NEEDED FOR NAUSEA AND VOMITING Patient not taking: Reported on 10/10/2021 06/13/20   Jacquelin Hawking, NP    Inpatient Medications: Scheduled Meds:  amLODipine  10 mg Oral Daily   aspirin  324 mg Oral NOW   Or   aspirin  300 mg Rectal NOW   [START ON 10/16/2021] aspirin EC  81 mg Oral Daily   atorvastatin  40 mg Oral QHS   isosorbide mononitrate  30 mg Oral Daily   letrozole  2.5 mg Oral Daily   losartan  50 mg Oral Daily   metoprolol succinate  25 mg Oral Daily   palbociclib  75 mg Oral Daily   sodium chloride flush  3 mL Intravenous Q12H   Continuous Infusions:  heparin 850 Units/hr (10/15/21 0651)   PRN Meds: acetaminophen, nitroGLYCERIN, ondansetron (ZOFRAN) IV  Allergies:    Allergies  Allergen Reactions   Codeine Rash    hallucinations    Social History:   Social History   Socioeconomic  History   Marital status: Married    Spouse name: Jeneen Rinks   Number of children: 2   Years of education: 6   Highest education level: Bachelor's degree (e.g., BA, AB, BS)  Occupational History   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[employer]  Tobacco Use   Smoking status: Never   Smokeless tobacco: Never  Vaping Use   Vaping Use: Never used  Substance and Sexual Activity   Alcohol use: No   Drug use: No   Sexual activity: Never  Other Topics Concern   Not on file  Social History Narrative   Not on file   Social Determinants of Health   Financial Resource Strain: Low Risk  (04/05/2017)   Overall Financial Resource Strain (CARDIA)    Difficulty  of Paying Living Expenses: 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  Food Insecurity: No Food Insecurity (04/05/2017)   Hunger Vital Sign    Worried About Running Out of Food in the Last Year: Never true    Ran Out of Food in the Last Year: Never true  Transportation Needs: No Transportation Needs (04/05/2017)   PRAPARE - Hydrologist (Medical): No    Lack of Transportation (Non-Medical): No  Physical Activity: Unknown (04/05/2017)   Exercise Vital Sign    Days of Exercise per Week: Patient refused    Minutes of Exercise per Session: Patient refused  Stress: No Stress Concern Present (04/05/2017)   [place]    Feeling of Stress : Not at all  Social Connections: Unknown (04/05/2017)   Social Connection and Isolation Panel [NHANES]    Frequency of Communication with Friends and Family: More than three times a week    Frequency of Social Gatherings with Friends and Family: Once a week    Attends Religious Services: More than 4 times per year    Active Member of [employer] or Organizations: No    Attends Music therapist: Never    Marital Status: Not on file  Intimate Partner Violence: Not on file    Family History:    Family History   Problem Relation Age of Onset   Parkinson's disease Mother    Heart disease Father    Bladder Cancer Father    Lung cancer Father    Heart disease Maternal Grandfather      ROS:  Please see the history of present illness.   All other ROS reviewed and negative.     Physical Exam/Data:   Vitals:   10/15/21 0600 10/15/21 0700 10/15/21 1143 10/15/21 1144  BP: (!) 125/49 (!) 137/51 (!) 128/49 (!) 128/49  Pulse: 61 67 68   Resp: (!) 28 16    Temp:      TempSrc:      SpO2: 96% 98%    Weight:      Height:       No intake or output data in the 24 hours ending 10/15/21 1440    10/14/2021   10:49 PM 10/10/2021    1:28 PM 10/09/2021   10:39 AM  Last 3 Weights  Weight (lbs) 200 lb 199 lb 8 oz 200 lb 2 oz  Weight (kg) 90.719 kg 90.493 kg 90.776 kg     Body mass index is 36.58 kg/m.  General:  Well nourished, well developed, in no acute distress HEENT: normal Neck: no JVD Vascular: No carotid bruits; Distal pulses 2+ bilaterally Cardiac:  normal S1, S2; RRR; no murmur  Lungs:  clear to auscultation bilaterally, no wheezing, rhonchi or rales  Abd: soft, nontender, no hepatomegaly  Ext: no edema Musculoskeletal:  No deformities, BUE and BLE strength normal and equal Skin: warm and dry  Neuro:  CNs 2-12 intact, no focal abnormalities noted Psych:  Normal affect   EKG:  The EKG was personally reviewed and demonstrates: Sinus rhythm Telemetry:  Telemetry was personally reviewed and demonstrates: Sinus rhythm  Relevant CV Studies:  TTE 10/01/2021 1. Left ventricular ejection fraction, by estimation, is 60 to 65%. The  left ventricle has normal function. The left ventricle has no regional  wall motion abnormalities. Left ventricular diastolic parameters are  consistent with Grade I diastolic  dysfunction (impaired relaxation).   2. Right ventricular systolic function is normal. The right  ventricular  size is normal.   3. The mitral valve is normal in structure. No evidence of  mitral valve  regurgitation.   4. The aortic valve is normal in structure. Aortic valve regurgitation is  not visualized.   Laboratory Data:  High Sensitivity Troponin:   Recent Labs  Lab 10/14/21 2247 10/15/21 0510 10/15/21 0641 10/15/21 0948  TROPONINIHS 19* 680* 744* 607*     Chemistry Recent Labs  Lab 10/10/21 1324 10/14/21 2247  NA 138 140  K 3.5 3.2*  CL 103 105  CO2 26 23  GLUCOSE 192* 202*  BUN 14 13  CREATININE 0.99 1.10*  CALCIUM 9.5 9.8  GFRNONAA 58* 51*  ANIONGAP 9 12    Recent Labs  Lab 10/10/21 1324 10/14/21 2247  PROT 7.0 7.9  ALBUMIN 3.6 4.0  AST 35 40  ALT 24 27  ALKPHOS 55 77  BILITOT 1.4* 1.2   Lipids No results for input(s): "CHOL", "TRIG", "HDL", "LABVLDL", "LDLCALC", "CHOLHDL" in the last 168 hours.  Hematology Recent Labs  Lab 10/10/21 1324 10/14/21 2247  WBC 2.4* 3.3*  RBC 3.14* 3.31*  HGB 11.9* 12.1  HCT 33.5* 35.3*  MCV 106.7* 106.6*  MCH 37.9* 36.6*  MCHC 35.5 34.3  RDW 15.5 15.3  PLT 163 209   Thyroid No results for input(s): "TSH", "FREET4" in the last 168 hours.  BNPNo results for input(s): "BNP", "PROBNP" in the last 168 hours.  DDimer No results for input(s): "DDIMER" in the last 168 hours.   Radiology/Studies:  DG Chest Port 1 View  Result Date: 10/15/2021 CLINICAL DATA:  Chest pain for several hours EXAM: PORTABLE CHEST 1 VIEW COMPARISON:  10/15/2016 FINDINGS: Cardiac shadow is enlarged but stable. Right chest wall port is noted in satisfactory position. Lungs are well aerated bilaterally. No focal confluent infiltrate is seen. Patchy sclerotic foci are noted throughout the bony structures consistent with metastatic disease. IMPRESSION: Changes consistent with bony metastatic disease. No acute infiltrate is seen. Electronically Signed   By: Inez Catalina M.D.   On: 10/15/2021 00:01     Assessment and Plan:   Chest pain, NSTEMI, -Currently denies chest pain -Coronary CTA as outpatient with significant RCA  stenosis -Continue heparin drip, aspirin, Lipitor -Echo 2 weeks ago with EF 60 to 65% -Plan left heart cath tomorrow, n.p.o. midnight  2.  Hypertension -Blood pressure control -Losartan, Imdur, Norvasc   Total encounter time more than 85 minutes  Greater than 50% was spent in counseling and coordination of care with the patient  Signed, Kate Sable, MD  10/15/2021 2:40 PM

## 2021-10-15 NOTE — Assessment & Plan Note (Signed)
Oral repletion and monitor 

## 2021-10-15 NOTE — Progress Notes (Signed)
ANTICOAGULATION CONSULT NOTE - Initial Consult  Pharmacy Consult for  Heparin  Indication: chest pain/ACS  Allergies  Allergen Reactions   Codeine Rash    hallucinations    Patient Measurements: Height: '5\' 2"'$  (157.5 cm) Weight: 90.7 kg (200 lb) IBW/kg (Calculated) : 50.1 Heparin Dosing Weight: 71.1 kg   Vital Signs: BP: 123/49 (07/05 1500) Pulse Rate: 59 (07/05 1500)  Labs: Recent Labs    10/14/21 2247 10/15/21 0510 10/15/21 0641 10/15/21 0948 10/15/21 1516  HGB 12.1  --   --   --   --   HCT 35.3*  --   --   --   --   PLT 209  --   --   --   --   APTT  --   --  29  --   --   LABPROT  --   --  14.4  --   --   INR  --   --  1.1  --   --   HEPARINUNFRC  --   --  0.22*  --  0.59  CREATININE 1.10*  --   --   --   --   TROPONINIHS 19* 680* 744* 607*  --      Estimated Creatinine Clearance: 44.1 mL/min (A) (by C-G formula based on SCr of 1.1 mg/dL (H)).   Medical History: Past Medical History:  Diagnosis Date   Breast cancer ([place])    Cancer ([place])    left breast   Coronary artery disease 08/30/2021   Coronary Calcium Score 788.  Combination of calcified and noncalcified plaque-severe lesion in mid RCA (estimated greater than 70%).  Moderate LAD and LCx 50 to 69%). => CAD RADS 4 with severe stenosis suggested in the RCA.  FFRCT not submitted due to motion artifact.-Recommended cardiac catheterization.   DVT (deep venous thrombosis) ([place])    2019   Hypertension    Port catheter in place 09/14/2016   Placed 09/10/2016 RT chest.    Medications:  (Not in a hospital admission)  Assessment: Pharmacy consulted to dose heparin in this 81 year old female admitted with ACS/NSTEMI.  Pt was on Eliquis 5 mg PO BID PTA, last dose was last week.   CrCl = 44.1 ml/min   7/5 0641 HL = 0.22 7/5 1516 HL = 0.59  Goal of Therapy:  Heparin level 0.3-0.7 units/ml Monitor platelets by anticoagulation protocol: Yes   Plan:  Heparin therapeutic x 1  Continue heparin infusion at  850 units/hr Re-Check anti-Xa level in 8 hours to confirm Continue to monitor H&H and platelets  Dorothe Pea, PharmD, BCPS Clinical Pharmacist   10/15/2021,3:50 PM

## 2021-10-15 NOTE — ED Notes (Signed)
Pt observed resting quietly, eyes closed, resp even and unlabored

## 2021-10-16 ENCOUNTER — Other Ambulatory Visit (HOSPITAL_COMMUNITY): Payer: Self-pay

## 2021-10-16 ENCOUNTER — Ambulatory Visit: Admission: RE | Admit: 2021-10-16 | Payer: Medicare Other | Source: Home / Self Care | Admitting: Cardiology

## 2021-10-16 ENCOUNTER — Other Ambulatory Visit: Payer: Self-pay

## 2021-10-16 ENCOUNTER — Encounter
Admission: EM | Disposition: A | Discharge status: Home / Self Care | Payer: Self-pay | Source: Home / Self Care | Attending: Osteopathic Medicine

## 2021-10-16 ENCOUNTER — Telehealth (HOSPITAL_COMMUNITY): Payer: Self-pay | Admitting: Pharmacy Technician

## 2021-10-16 DIAGNOSIS — R9389 Abnormal findings on diagnostic imaging of other specified body structures: Secondary | ICD-10-CM

## 2021-10-16 DIAGNOSIS — I2510 Atherosclerotic heart disease of native coronary artery without angina pectoris: Secondary | ICD-10-CM

## 2021-10-16 HISTORY — PX: CORONARY STENT INTERVENTION: CATH118234

## 2021-10-16 HISTORY — PX: LEFT HEART CATH AND CORONARY ANGIOGRAPHY: CATH118249

## 2021-10-16 LAB — BASIC METABOLIC PANEL
Anion gap: 6 (ref 5–15)
BUN: 12 mg/dL (ref 8–23)
CO2: 23 mmol/L (ref 22–32)
Calcium: 9.1 mg/dL (ref 8.9–10.3)
Chloride: 108 mmol/L (ref 98–111)
Creatinine, Ser: 1.2 mg/dL — ABNORMAL HIGH (ref 0.44–1.00)
GFR, Estimated: 46 mL/min — ABNORMAL LOW (ref >60)
Glucose, Bld: 152 mg/dL — ABNORMAL HIGH (ref 70–99)
Potassium: 3.6 mmol/L (ref 3.5–5.1)
Sodium: 137 mmol/L (ref 135–145)

## 2021-10-16 LAB — LIPOPROTEIN A (LPA): Lipoprotein (a): 157.2 nmol/L — ABNORMAL HIGH (ref <75.0)

## 2021-10-16 LAB — CBC
HCT: 32.1 % — ABNORMAL LOW (ref 36.0–46.0)
Hemoglobin: 11 g/dL — ABNORMAL LOW (ref 12.0–15.0)
MCH: 36.4 pg — ABNORMAL HIGH (ref 26.0–34.0)
MCHC: 34.3 g/dL (ref 30.0–36.0)
MCV: 106.3 fL — ABNORMAL HIGH (ref 80.0–100.0)
Platelets: 235 10*3/uL (ref 150–400)
RBC: 3.02 MIL/uL — ABNORMAL LOW (ref 3.87–5.11)
RDW: 15.4 % (ref 11.5–15.5)
WBC: 2.8 10*3/uL — ABNORMAL LOW (ref 4.0–10.5)
nRBC: 0 % (ref 0.0–0.2)

## 2021-10-16 LAB — HEPARIN LEVEL (UNFRACTIONATED)
Heparin Unfractionated: 0.59 IU/mL (ref 0.30–0.70)
Heparin Unfractionated: 0.56 IU/mL (ref 0.30–0.70)

## 2021-10-16 LAB — TROPONIN I (HIGH SENSITIVITY): Troponin I (High Sensitivity): 744 ng/L (ref <18)

## 2021-10-16 LAB — POCT ACTIVATED CLOTTING TIME: Activated Clotting Time: 371 seconds

## 2021-10-16 SURGERY — LEFT HEART CATH AND CORONARY ANGIOGRAPHY
Anesthesia: Moderate Sedation

## 2021-10-16 MED ORDER — ONDANSETRON HCL 4 MG/2ML IJ SOLN
INTRAMUSCULAR | Status: AC
  Filled 2021-10-16: qty 2

## 2021-10-16 MED ORDER — SODIUM CHLORIDE 0.9% FLUSH: 3.0000 mL | Freq: Two times a day (BID) | INTRAVENOUS | Status: DC

## 2021-10-16 MED ORDER — SODIUM CHLORIDE 0.9 % WEIGHT BASED INFUSION
3.0000 mL/kg/h | INTRAVENOUS | Status: DC
  Administered 2021-10-16: 3 mL/kg/h via INTRAVENOUS

## 2021-10-16 MED ORDER — ACETAMINOPHEN 325 MG PO TABS: 650.0000 mg | ORAL_TABLET | ORAL | Status: DC | PRN

## 2021-10-16 MED ORDER — LABETALOL HCL 5 MG/ML IV SOLN: 10.0000 mg | INTRAVENOUS | Status: AC | PRN

## 2021-10-16 MED ORDER — APIXABAN 5 MG PO TABS
5.0000 mg | ORAL_TABLET | Freq: Two times a day (BID) | ORAL | Status: DC
  Administered 2021-10-17: 5 mg via ORAL
  Filled 2021-10-16: qty 1

## 2021-10-16 MED ORDER — TICAGRELOR 90 MG PO TABS
ORAL_TABLET | ORAL | Status: DC | PRN
  Administered 2021-10-16: 180 mg via ORAL

## 2021-10-16 MED ORDER — ONDANSETRON HCL 4 MG/2ML IJ SOLN
INTRAMUSCULAR | Status: DC | PRN
  Administered 2021-10-16: 4 mg via INTRAVENOUS

## 2021-10-16 MED ORDER — TICAGRELOR 90 MG PO TABS
90.0000 mg | ORAL_TABLET | Freq: Once | ORAL | Status: AC
  Administered 2021-10-16: 90 mg via ORAL
  Filled 2021-10-16: qty 1

## 2021-10-16 MED ORDER — HEPARIN (PORCINE) 25000 UT/250ML-% IV SOLN
700.0000 [IU]/h | INTRAVENOUS | Status: AC
  Administered 2021-10-16: 850 [IU]/h via INTRAVENOUS
  Filled 2021-10-16: qty 250

## 2021-10-16 MED ORDER — FENTANYL CITRATE (PF) 100 MCG/2ML IJ SOLN
INTRAMUSCULAR | Status: DC | PRN
  Administered 2021-10-16: 25 ug via INTRAVENOUS
  Administered 2021-10-16: 50 ug via INTRAVENOUS
  Administered 2021-10-16: 25 ug via INTRAVENOUS

## 2021-10-16 MED ORDER — SODIUM CHLORIDE 0.9 % IV SOLN: INTRAVENOUS | Status: AC

## 2021-10-16 MED ORDER — ASPIRIN 81 MG PO CHEW
81.0000 mg | CHEWABLE_TABLET | Freq: Every day | ORAL | Status: DC
  Administered 2021-10-17: 81 mg via ORAL
  Filled 2021-10-16: qty 1

## 2021-10-16 MED ORDER — MIDAZOLAM HCL 2 MG/2ML IJ SOLN
INTRAMUSCULAR | Status: AC
  Filled 2021-10-16: qty 2

## 2021-10-16 MED ORDER — MIDAZOLAM HCL 2 MG/2ML IJ SOLN
INTRAMUSCULAR | Status: DC | PRN
  Administered 2021-10-16 (×2): 1 mg via INTRAVENOUS

## 2021-10-16 MED ORDER — HEPARIN (PORCINE) IN NACL 1000-0.9 UT/500ML-% IV SOLN
INTRAVENOUS | Status: AC
  Filled 2021-10-16: qty 1000

## 2021-10-16 MED ORDER — TICAGRELOR 90 MG PO TABS
ORAL_TABLET | ORAL | Status: AC
  Filled 2021-10-16: qty 1

## 2021-10-16 MED ORDER — SODIUM CHLORIDE 0.9 % IV SOLN: 250.0000 mL | INTRAVENOUS | Status: DC | PRN

## 2021-10-16 MED ORDER — HEPARIN SODIUM (PORCINE) 1000 UNIT/ML IJ SOLN
INTRAMUSCULAR | Status: DC | PRN
  Administered 2021-10-16 (×2): 4500 [IU] via INTRAVENOUS

## 2021-10-16 MED ORDER — LIDOCAINE HCL 1 % IJ SOLN
INTRAMUSCULAR | Status: AC
  Filled 2021-10-16: qty 20

## 2021-10-16 MED ORDER — VERAPAMIL HCL 2.5 MG/ML IV SOLN
INTRAVENOUS | Status: DC | PRN
  Administered 2021-10-16: 2.5 mg via INTRA_ARTERIAL

## 2021-10-16 MED ORDER — CLOPIDOGREL BISULFATE 75 MG PO TABS
300.0000 mg | ORAL_TABLET | Freq: Once | ORAL | Status: AC
  Administered 2021-10-17: 300 mg via ORAL
  Filled 2021-10-16: qty 4

## 2021-10-16 MED ORDER — HEPARIN (PORCINE) IN NACL 1000-0.9 UT/500ML-% IV SOLN
INTRAVENOUS | Status: DC | PRN
  Administered 2021-10-16: 1000 mL
  Administered 2021-10-16: 500 mL

## 2021-10-16 MED ORDER — VERAPAMIL HCL 2.5 MG/ML IV SOLN
INTRAVENOUS | Status: AC
  Filled 2021-10-16: qty 2

## 2021-10-16 MED ORDER — IOHEXOL 300 MG/ML  SOLN
INTRAMUSCULAR | Status: DC | PRN
  Administered 2021-10-16: 200 mL

## 2021-10-16 MED ORDER — SODIUM CHLORIDE 0.9% FLUSH: 3.0000 mL | INTRAVENOUS | Status: DC | PRN

## 2021-10-16 MED ORDER — ASPIRIN 81 MG PO CHEW: 81.0000 mg | CHEWABLE_TABLET | ORAL | Status: DC

## 2021-10-16 MED ORDER — FENTANYL CITRATE (PF) 100 MCG/2ML IJ SOLN
INTRAMUSCULAR | Status: AC
  Filled 2021-10-16: qty 2

## 2021-10-16 MED ORDER — LIDOCAINE HCL (PF) 1 % IJ SOLN
INTRAMUSCULAR | Status: DC | PRN
  Administered 2021-10-16: 5 mL

## 2021-10-16 MED ORDER — ONDANSETRON HCL 4 MG/2ML IJ SOLN: 4.0000 mg | Freq: Four times a day (QID) | INTRAMUSCULAR | Status: DC | PRN

## 2021-10-16 MED ORDER — SODIUM CHLORIDE 0.9 % WEIGHT BASED INFUSION
1.0000 mL/kg/h | INTRAVENOUS | Status: DC
  Administered 2021-10-16: 1 mL/kg/h via INTRAVENOUS

## 2021-10-16 MED ORDER — HYDRALAZINE HCL 20 MG/ML IJ SOLN: 10.0000 mg | INTRAMUSCULAR | Status: AC | PRN

## 2021-10-16 MED ORDER — HEPARIN SODIUM (PORCINE) 1000 UNIT/ML IJ SOLN
INTRAMUSCULAR | Status: AC
  Filled 2021-10-16: qty 10

## 2021-10-16 MED ORDER — PALBOCICLIB 75 MG PO TABS
75.0000 mg | ORAL_TABLET | Freq: Every day | ORAL | Status: DC
Start: 2021-10-17 — End: 2021-10-17

## 2021-10-16 SURGICAL SUPPLY — 21 items
BALLN TREK RX 2.5X12 (BALLOONS) ×2
BALLN ~~LOC~~ TREK NEO RX 2.75X12 (BALLOONS) ×1 IMPLANT
BALLOON TREK RX 2.5X12 (BALLOONS) IMPLANT
BAND ZEPHYR COMPRESS 30 LONG (HEMOSTASIS) ×1 IMPLANT
CATH 5F 110X4 TIG (CATHETERS) ×1 IMPLANT
CATH LAUNCHER 5F EBU3.0 (CATHETERS) IMPLANT
CATHETER LAUNCHER 5F EBU3.0 (CATHETERS) ×2
DRAPE BRACHIAL (DRAPES) ×1 IMPLANT
GLIDESHEATH SLEND SS 6F .021 (SHEATH) ×1 IMPLANT
GUIDEWIRE INQWIRE 1.5J.035X260 (WIRE) IMPLANT
INQWIRE 1.5J .035X260CM (WIRE) ×2
KIT ENCORE 26 ADVANTAGE (KITS) ×1 IMPLANT
PACK CARDIAC CATH (CUSTOM PROCEDURE TRAY) ×2 IMPLANT
PROTECTION STATION PRESSURIZED (MISCELLANEOUS) ×2
SET ATX SIMPLICITY (MISCELLANEOUS) ×1 IMPLANT
STATION PROTECTION PRESSURIZED (MISCELLANEOUS) IMPLANT
STENT ONYX FRONTIER 2.5X18 (Permanent Stent) ×1 IMPLANT
STENT ONYX FRONTIER 3.0X15 (Permanent Stent) ×1 IMPLANT
TUBING CIL FLEX 10 FLL-RA (TUBING) ×1 IMPLANT
WIRE ASAHI PROWATER 180CM (WIRE) ×1 IMPLANT
WIRE RUNTHROUGH .014X180CM (WIRE) ×1 IMPLANT

## 2021-10-16 NOTE — Progress Notes (Signed)
ANTICOAGULATION CONSULT NOTE - Initial Consult  Pharmacy Consult for  Heparin  Indication: chest pain/ACS  Allergies  Allergen Reactions   Codeine Rash    hallucinations    Patient Measurements: Height: '5\' 2"'$  (157.5 cm) Weight: 91.2 kg (201 lb 1 oz) IBW/kg (Calculated) : 50.1 Heparin Dosing Weight: 71.1 kg   Vital Signs: Temp: 98.3 F (36.8 C) (07/06 0000) Temp Source: Axillary (07/06 0000) BP: 152/55 (07/06 0000) Pulse Rate: 66 (07/06 0000)  Labs: Recent Labs    10/14/21 2247 10/15/21 0510 10/15/21 0641 10/15/21 0948 10/15/21 1516 10/15/21 2318  HGB 12.1  --   --   --   --   --   HCT 35.3*  --   --   --   --   --   PLT 209  --   --   --   --   --   APTT  --   --  29  --   --   --   LABPROT  --   --  14.4  --   --   --   INR  --   --  1.1  --   --   --   HEPARINUNFRC  --   --  0.22*  --  0.59 0.59  CREATININE 1.10*  --   --   --   --   --   TROPONINIHS 19* 680* 744* 607*  --   --      Estimated Creatinine Clearance: 44.2 mL/min (A) (by C-G formula based on SCr of 1.1 mg/dL (H)).   Medical History: Past Medical History:  Diagnosis Date   Breast cancer ([place])    Cancer ([place])    left breast   Coronary artery disease 08/30/2021   Coronary Calcium Score 788.  Combination of calcified and noncalcified plaque-severe lesion in mid RCA (estimated greater than 70%).  Moderate LAD and LCx 50 to 69%). => CAD RADS 4 with severe stenosis suggested in the RCA.  FFRCT not submitted due to motion artifact.-Recommended cardiac catheterization.   DVT (deep venous thrombosis) ([place])    2019   Hypertension    Port catheter in place 09/14/2016   Placed 09/10/2016 RT chest.    Medications:  Facility-Administered Medications Prior to Admission  Medication Dose Route Frequency Provider Last Rate Last Admin   sodium chloride flush (NS) 0.9 % injection 3 mL  3 mL Intravenous Q12H Leonie Man, MD       Medications Prior to Admission  Medication Sig Dispense Refill Last Dose    amLODipine (NORVASC) 10 MG tablet Take 10 mg by mouth daily.   10/14/2021   apixaban (ELIQUIS) 5 MG TABS tablet Take 1 tablet (5 mg total) by mouth 2 (two) times daily. 60 tablet 6 Past Week   atorvastatin (LIPITOR) 40 MG tablet Take 40 mg by mouth at bedtime.   10/13/2021   Calcium Carb-Cholecalciferol (CALCIUM 600/VITAMIN D3 PO) Take 1 tablet by mouth daily.   10/14/2021   hydrochlorothiazide (HYDRODIURIL) 12.5 MG tablet Take 12.5 mg by mouth daily.   10/14/2021   isosorbide mononitrate (IMDUR) 30 MG 24 hr tablet Take 1 tablet (30 mg total) by mouth daily. 30 tablet 6 10/14/2021   letrozole (FEMARA) 2.5 MG tablet Take 1 tablet (2.5 mg total) by mouth daily. 90 tablet 5 10/14/2021   losartan (COZAAR) 50 MG tablet Take 1 tablet by mouth once a day  for high blood pressure- to replace 100 mg   10/14/2021  metoprolol succinate (TOPROL XL) 25 MG 24 hr tablet Take 1 tablet (25 mg total) by mouth daily. 30 tablet 6 10/14/2021   palbociclib (IBRANCE) 75 MG tablet Take 1 tablet (75 mg total) by mouth daily. Take for 21 days on, 7 days off, repeat every 28 days. 21 tablet 3 unk at unk   acetaminophen (TYLENOL) 500 MG tablet Take 500 mg by mouth daily as needed for moderate pain or headache.    prn at prn   ibuprofen (ADVIL,MOTRIN) 400 MG tablet Take 400 mg by mouth 3 (three) times daily as needed.   prn at prn   lidocaine-prilocaine (EMLA) cream Apply 1 application. topically as needed (port access). 30 g 2 prn at prn   PREVIDENT 5000 BOOSTER PLUS 1.1 % PSTE Place 1 application onto teeth 2 (two) times daily.  (Patient not taking: Reported on 10/09/2021)   Not Taking   prochlorperazine (COMPAZINE) 10 MG tablet TAKE 1 TABLET BY MOUTH EVERY 6 HOURS AS NEEDED FOR NAUSEA AND VOMITING (Patient not taking: Reported on 10/10/2021) 30 tablet 0 Not Taking    Assessment: Pharmacy consulted to dose heparin in this 81 year old female admitted with ACS/NSTEMI.  Pt was on Eliquis 5 mg PO BID PTA, last dose was last week.   CrCl =  44.1 ml/min   7/5 0641 HL = 0.22 7/5 1516 HL = 0.59 7/5 2318 HL = 0.59, therapeutic X 2  Goal of Therapy:  Heparin level 0.3-0.7 units/ml Monitor platelets by anticoagulation protocol: Yes   Plan:  7/5: HL @ 2318 = 0.59, therapeutic X 2 Will continue pt on current rate and recheck HL on 7/6 with AM labs.  [place] Pharmacist   10/16/2021,12:10 AM

## 2021-10-16 NOTE — Interval H&P Note (Signed)
History and Physical Interval Note:  10/16/2021 1:00 PM  Victoria Steele  has presented today for surgery, with the diagnosis of LT Heart Cath   Abnormal CT scan of heart.  The various methods of treatment have been discussed with the patient and family. After consideration of risks, benefits and other options for treatment, the patient has consented to  Procedure(s): LEFT HEART CATH AND CORONARY ANGIOGRAPHY (N/A)  PERCUTANEOUS CORONARY INTERVENTION  as a surgical intervention.  The patient's history has been reviewed, patient examined, no change in status, stable for surgery.  I have reviewed the patient's chart and labs.  Questions were answered to the patient's satisfaction.     Cath Lab Visit (complete for each Cath Lab visit)  Clinical Evaluation Leading to the Procedure:   ACS: Yes.    Non-ACS:    Anginal Classification: CCS IV  Anti-ischemic medical therapy: Maximal Therapy (2 or more classes of medications)  Non-Invasive Test Results: High-risk stress test findings: cardiac mortality >3%/year  Prior CABG: No previous CABG    Glenetta Hew

## 2021-10-16 NOTE — Brief Op Note (Addendum)
BRIEF CARDIAC CATHETERIZATION & PCI NOTE  LAURELLA TULL  MRN:  [id number]  10/16/2021  3:15 PM   PROCEDURE:  Procedure(s): LEFT HEART CATH AND CORONARY ANGIOGRAPHY (N/A) CORONARY STENT INTERVENTION (N/A)  SURGEON:  Surgeon(s) and Role:    * Marykay Lex, MD - Primary  PATIENT:  Victoria Steele  81 y.o. female with history of metastatic breast cancer and documented CAD by Coronary CTA with planned cardiac catheterization on 10/16/2021 who actually presented on the evening of 10/14/2021 with prolonged chest pain and positive troponin consistent with ACS.  She now presents for planned cardiac catheterization and possible PCI.  PRE-OPERATIVE DIAGNOSIS: Non-STEMI; Abnormal Coronary CTA  POST-OPERATIVE DIAGNOSIS:   Severe three-vessel disease: CTO proximal to mid RCA with bridging collaterals and left-to-right collaterals. ~Proximal RCA segmental 75% stenosis involving ostium of D1 and D2.  Otherwise diffuse moderate 50% mid LAD.  LAD perfuses the PDA via collaterals. Successful DES PCI with Onyx Frontier DES 3.0 mm x 15 mm -> deployed to 3.3 mm Down from mid LCx 75% stenosis, just proximal to OM 2 Successful DES PCI with Onyx Frontier DES 2.5 mm 18 mm -> tapered from 2.8 to 2.6 mm  Known normal EF with normal EDP   PROCEDURE: Time Out: Verified patient identification, verified procedure, site/side was marked, verified correct patient position, special equipment/implants available, medications/allergies/relevent history reviewed, required imaging and test results available. Performed.  Access:  RIGHT Radial Artery: 6 Fr sheath -- Seldinger technique using Micropuncture Kit -- Direct ultrasound guidance used.  Permanent image obtained and placed on chart. -- 10 mL radial cocktail IA; 4500 Units IV Heparin  Left Heart Catheterization: 5Fr Catheters advanced or exchanged over a J-wire under direct fluoroscopic guidance into the ascending aorta; TIG 4.0 catheter advanced first.  * LV  Hemodynamics (LV Gram): TIG 4.0 Catheter * Left & Right Coronary Artery Cineangiography: TIG 4.0 Catheter   Review of initial angiography revealed: Findings noted above-  Preparations are made for LAD and LCx PCI  PCI performed: 5 French EBU 3.0 guide catheter: LAD> Prowater wire -> 2.5 mm x 12 mm balloon predilation -> stent deployment; postdilated with stent balloon LCx> Prowater wire -> 2.5 x 12 balloon predilation -> stent deployment, high-pressure stent balloon inflation followed by post dilation proximally (POT) with 2.75 mm balloon.  Upon completion of Angiogaphy, the catheter was removed completely out of the body over a wire, without complication.  Radial sheath removed in the Cardiac Catheterization lab with Zephyr band placed for hemostasis.  Zephyr band: 1500  Hours; 13 mL air  MEDICATIONS SQ Lidocaine 3 mL Radial Cocktail: 3 mg Verapmil in 10 mL NS Heparin: 9000 Units  ANESTHESIA:   local and IV sedation; 3 mL lidocaine; 2 mg Versed, 100 mg fentanyl  EBL:  <20 mL COUNTS:  YES  TOURNIQUET:  * No tourniquets in log *  DICTATION: .Note written in EPIC  PLAN OF CARE:  Monitor overnight post PCI, anticipate discharge in the morning. Was loaded with Brilinta in the Cath Lab, will give Brilinta tonight and then reload Plavix tomorrow.   Restart DOAC tomorrow morning (does not likely need to bridge with heparin, can continue overnight or simply discontinue.  If restarted would restart 2 hours post sheath removal). Otherwise continue home meds.  Likely need to titrate antihypertensives.  PATIENT DISPOSITION:  PACU - hemodynamically stable.   Delay start of Pharmacological VTE agent (>24hrs) due to surgical blood loss or risk of bleeding: not applicable =>  restart DOAC in the morning   Glenetta Hew, MD

## 2021-10-16 NOTE — TOC Benefit Eligibility Note (Signed)
Patient Teacher, English as a foreign language completed.    The patient is currently admitted and upon discharge could be taking Brilinta 90 mg.  The current 30 day co-pay is, $23.44.   The patient is insured through [place], [place] Patient Advocate Specialist [place] Patient Advocate Team Direct Number: [phone]  Fax: [phone]

## 2021-10-16 NOTE — Progress Notes (Signed)
ANTICOAGULATION CONSULT NOTE  Pharmacy Consult for IV Heparin  Indication: chest pain/ACS  Patient Measurements: Height: '5\' 2"'$  (157.5 cm) Weight: 91.2 kg (201 lb 1 oz) IBW/kg (Calculated) : 50.1 Heparin Dosing Weight: 71.1 kg   Labs: Recent Labs    10/14/21 2247 10/15/21 0510 10/15/21 0641 10/15/21 0641 10/15/21 0948 10/15/21 1516 10/15/21 2318 10/16/21 0747  HGB 12.1  --   --   --   --   --   --  11.0*  HCT 35.3*  --   --   --   --   --   --  32.1*  PLT 209  --   --   --   --   --   --  235  APTT  --   --  29  --   --   --   --   --   LABPROT  --   --  14.4  --   --   --   --   --   INR  --   --  1.1  --   --   --   --   --   HEPARINUNFRC  --   --  0.22*   < >  --  0.59 0.59 0.56  CREATININE 1.10*  --   --   --   --   --   --  1.20*  TROPONINIHS 19* 680* 744*  --  607*  --   --   --    < > = values in this interval not displayed.     Estimated Creatinine Clearance: 40.6 mL/min (A) (by C-G formula based on SCr of 1.2 mg/dL (H)).   Medical History: Past Medical History:  Diagnosis Date   Breast cancer ([place])    Cancer ([place])    left breast   Coronary artery disease 08/30/2021   Coronary Calcium Score 788.  Combination of calcified and noncalcified plaque-severe lesion in mid RCA (estimated greater than 70%).  Moderate LAD and LCx 50 to 69%). => CAD RADS 4 with severe stenosis suggested in the RCA.  FFRCT not submitted due to motion artifact.-Recommended cardiac catheterization.   DVT (deep venous thrombosis) ([place])    2019   Hypertension    Port catheter in place 09/14/2016   Placed 09/10/2016 RT chest.    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 Pharmacy consulted to initiate and manage heparin infusion for ACS. Home apixaban on hold currently, appears patient has not been taking this as outpatient most recently given plan for catheterization.  Patient went for  cardiac catheterization 7/6 with findings of 3-vessel CAD and PCI was performed with stenting x 2.   7/5 0641 HL = 0.22 7/5 1516 HL = 0.59 7/5 2318 HL = 0.59, therapeutic x 2 7/6 0747 HL = 0.56, therapeutic x 3  Goal of Therapy:  Heparin level 0.3-0.7 units/ml Monitor platelets by anticoagulation protocol: Yes   Plan:  --Heparin level continues to be therapeutic --Maintain heparin infusion at 850 units/hr --Re-check HL and CBC tomorrow AM --Follow-up anticoagulation plan post-catheterization  Benita Gutter 10/16/2021,3:38 PM

## 2021-10-16 NOTE — Progress Notes (Signed)
PROGRESS NOTE    Victoria Steele  FXT:[id number] DOB: 1940/08/15  DOA: 10/14/2021 Date of Service: 10/16/21 PCP: Letta Median, MD     Brief Narrative / Hospital Course:   Victoria Steele is a 81 y.o. 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  She had no associated nausea, vomiting or diaphoresis.  Denies shortness of breath or lower extremity pain or swelling.  Patient had been off her apixaban for 2 days in preparation for her upcoming procedure.  07/04: In ED BP 175/49 with otherwise normal vitals. Labs:First troponin 19. EKG without acute ST-T wave changes. CXR bony metastatic disease w/o acute infiltrate. Tx:  with an inch of Nitropaste with improvement in pain from 8 out of 10 to 2 out of 10.  Hospitalist consulted for admission.  Continued isosorbide, metoprolol, atorvastatin, Nitropaste. Consult to cardiology  07/05: Troponin 19 on admission --> 680 at 05:10 --(started heparin gtt)--> 744 at 06:41 --> 607 at 09:48  07/06: SBP elevated, VS otherwise no concerns. Still low WBC, stable Hgb, slight bump Cr but not severe. Remains on heparin. Plan for L heart cath today.   Consultants:  Cardiology   Procedures: none    Subjective: Patient reports feeling okay, nitro is helping chest pain, no CP/SOB at this time.      ASSESSMENT & PLAN:   Principal Problem:   Unstable angina (HCC) Active Problems:   Essential hypertension   PAD (peripheral artery disease) (HCC)   Hypokalemia   Breast cancer in female [facility])   Chronic anticoagulation  Unstable angina (HCC) NSTEMI Has been following with cardiology with diagnosis of angina lll and was scheduled for cardiac cath on 7/6 Continue isosorbide, metoprolol, atorvastatin,  Nitropaste Morphine as needed pain Cardiology recs: plan L heart cath today 10/16/21  Breast cancer in female [facility]) Currently on Ibrance and letrozole and to receive Zometa, followed by Dr. Janese Banks  PAD (peripheral artery disease) ([place]) Continue atorvastatin  Essential hypertension Continue metoprolol, losartan, amlodipine Holding home HCTZ  Chronic anticoagulation On apixaban secondary to history of DVT 3 years prior with history of thrombectomy Continue apixaban  Hypokalemia Oral repletion and monitor    DVT prophylaxis: on heparin gtt for NSTEMI Code Status: FULL Family Communication: husband at bedside on rounds  Disposition Plan / TOC needs: order placed 07/05 to switch from OBS to INPT, remains on progresive Barriers to discharge / significant pending items: cardiology planning cath today, remains on IV heparin gtt              Objective: Vitals:   10/16/21 0300 10/16/21 0400 10/16/21 0500 10/16/21 0800  BP: (!) 133/54 (!) 147/55 (!) 155/53 (!) 161/55  Pulse: (!) 58 61 61 75  Resp: 20 (!) '22 18 18  '$ Temp:  97.8 F (36.6 C)  98.4 F (36.9 C)  TempSrc:  Oral  Oral  SpO2: 95% 94% 95% 98%  Weight:      Height:        Intake/Output Summary (Last 24 hours) at 10/16/2021 0927 Last data filed at 10/16/2021 0800 Gross per 24 hour  Intake 251.91 ml  Output --  Net 251.91 ml   Filed Weights   10/14/21 2249 10/15/21 2110  Weight: 90.7 kg 91.2 kg  Examination:  Constitutional:  VS as above General Appearance: alert, well-developed, well-nourished, NAD Eyes: Normal lids and conjunctive, non-icteric sclera Ears, Nose, Mouth, Throat: Normal appearance Neck: No masses, trachea midline Respiratory: Normal respiratory effort Breath sounds normal, no wheeze/rhonchi/rales Cardiovascular: S1/S2 normal, no murmur/rub/gallop auscultated Trace lower extremity edema Gastrointestinal: Nontender, no masses Musculoskeletal:  No clubbing/cyanosis of  digits Neurological: No cranial nerve deficit on limited exam Psychiatric: Normal judgment/insight Normal mood and affect       Scheduled Medications:   amLODipine  10 mg Oral Daily   aspirin EC  81 mg Oral Daily   atorvastatin  40 mg Oral QHS   Chlorhexidine Gluconate Cloth  6 each Topical Daily   isosorbide mononitrate  30 mg Oral Daily   letrozole  2.5 mg Oral Daily   losartan  50 mg Oral Daily   metoprolol succinate  25 mg Oral Daily   palbociclib  75 mg Oral Daily    Continuous Infusions:  heparin 850 Units/hr (10/16/21 0800)    PRN Medications:  acetaminophen, nitroGLYCERIN, ondansetron (ZOFRAN) IV, mouth rinse  Antimicrobials:  Anti-infectives (From admission, onward)    None       Data Reviewed: I have personally reviewed following labs and imaging studies  CBC: Recent Labs  Lab 10/10/21 1324 10/14/21 2247 10/16/21 0747  WBC 2.4* 3.3* 2.8*  NEUTROABS 1.2* 1.9  --   HGB 11.9* 12.1 11.0*  HCT 33.5* 35.3* 32.1*  MCV 106.7* 106.6* 106.3*  PLT 163 209 097    Basic Metabolic Panel: Recent Labs  Lab 10/10/21 1324 10/14/21 2247 10/16/21 0747  NA 138 140 137  K 3.5 3.2* 3.6  CL 103 105 108  CO2 '26 23 23  '$ GLUCOSE 192* 202* 152*  BUN '14 13 12  '$ CREATININE 0.99 1.10* 1.20*  CALCIUM 9.5 9.8 9.1    GFR: Estimated Creatinine Clearance: 40.6 mL/min (A) (by C-G formula based on SCr of 1.2 mg/dL (H)). Liver Function Tests: Recent Labs  Lab 10/10/21 1324 10/14/21 2247  AST 35 40  ALT 24 27  ALKPHOS 55 77  BILITOT 1.4* 1.2  PROT 7.0 7.9  ALBUMIN 3.6 4.0    Recent Labs  Lab 10/14/21 2247  LIPASE 36    No results for input(s): "AMMONIA" in the last 168 hours. Coagulation Profile: Recent Labs  Lab 10/15/21 0641  INR 1.1    Cardiac Enzymes: No results for input(s): "CKTOTAL", "CKMB", "CKMBINDEX", "TROPONINI" in the last 168 hours. BNP (last 3 results) No results for input(s): "PROBNP" in the last 8760 hours. HbA1C: No results for  input(s): "HGBA1C" in the last 72 hours. CBG: Recent Labs  Lab 10/15/21 2114  GLUCAP 150*   Lipid Profile: No results for input(s): "CHOL", "HDL", "LDLCALC", "TRIG", "CHOLHDL", "LDLDIRECT" in the last 72 hours. Thyroid Function Tests: No results for input(s): "TSH", "T4TOTAL", "FREET4", "T3FREE", "THYROIDAB" in the last 72 hours. Anemia Panel: No results for input(s): "VITAMINB12", "FOLATE", "FERRITIN", "TIBC", "IRON", "RETICCTPCT" in the last 72 hours. Urine analysis:    Component Value Date/Time   COLORURINE YELLOW (A) 10/09/2016 1526   APPEARANCEUR CLEAR (A) 10/09/2016 1526   LABSPEC 1.014 10/09/2016 1526   PHURINE 6.0 10/09/2016 1526   GLUCOSEU NEGATIVE 10/09/2016 1526   HGBUR SMALL (A) 10/09/2016 1526   BILIRUBINUR NEGATIVE 10/09/2016 [place] 10/09/2016 1526   PROTEINUR NEGATIVE 10/09/2016 1526   NITRITE NEGATIVE 10/09/2016 1526   LEUKOCYTESUR NEGATIVE 10/09/2016 1526   Sepsis Labs: '@LABRCNTIP'$ (procalcitonin:4,lacticidven:4)  Recent Results (from the past 240  hour(s))  MRSA Next Gen by PCR, Nasal     Status: None   Collection Time: 10/15/21  9:16 PM   Specimen: Nasal Mucosa; Nasal Swab  Result Value Ref Range Status   MRSA by PCR Next Gen NOT DETECTED NOT DETECTED Final    Comment: (NOTE) The GeneXpert MRSA Assay (FDA approved for NASAL specimens only), is one component of a comprehensive MRSA colonization surveillance program. It is not intended to diagnose MRSA infection nor to guide or monitor treatment for MRSA infections. Test performance is not FDA approved in patients less than 35 years old. Performed at [facility], [address], [place], [place] [address]          Radiology Studies last 96 hours: DG Chest [facility] 1 View  Result Date: 10/15/2021 CLINICAL DATA:  Chest pain for several hours EXAM: PORTABLE CHEST 1 VIEW COMPARISON:  10/15/2016 FINDINGS: Cardiac shadow is enlarged but stable. Right chest wall port is noted in  satisfactory position. Lungs are well aerated bilaterally. No focal confluent infiltrate is seen. Patchy sclerotic foci are noted throughout the bony structures consistent with metastatic disease. IMPRESSION: Changes consistent with bony metastatic disease. No acute infiltrate is seen. Electronically Signed   By: Inez Catalina M.D.   On: 10/15/2021 00:01            LOS: 1 day      Emeterio Reeve, DO [facility] 10/16/2021, 9:27 AM   Staff may message me via secure chat in [place]  but this may not receive immediate response,  please page for urgent matters!  If 7PM-7AM, please contact night-coverage [email or web address]  Dictation software was used to generate the above note. Typos may occur and escape review, as with typed/written notes. Please contact Dr Sheppard Coil directly for clarity if needed.

## 2021-10-16 NOTE — Telephone Encounter (Signed)
Pharmacy Patient Advocate Encounter  Insurance verification completed.    The patient is insured through [employer] Part D   The patient is currently admitted and ran test claims for the following: Brilinta 90 mg.  Copays and coinsurance results were relayed to Inpatient clinical team.

## 2021-10-17 ENCOUNTER — Encounter: Payer: Self-pay | Admitting: Cardiology

## 2021-10-17 DIAGNOSIS — I200 Unstable angina: Secondary | ICD-10-CM | POA: Diagnosis not present

## 2021-10-17 LAB — CBC
HCT: 31.8 % — ABNORMAL LOW (ref 36.0–46.0)
Hemoglobin: 11 g/dL — ABNORMAL LOW (ref 12.0–15.0)
MCH: 37.2 pg — ABNORMAL HIGH (ref 26.0–34.0)
MCHC: 34.6 g/dL (ref 30.0–36.0)
MCV: 107.4 fL — ABNORMAL HIGH (ref 80.0–100.0)
Platelets: 227 10*3/uL (ref 150–400)
RBC: 2.96 MIL/uL — ABNORMAL LOW (ref 3.87–5.11)
RDW: 15.4 % (ref 11.5–15.5)
WBC: 2.8 10*3/uL — ABNORMAL LOW (ref 4.0–10.5)
nRBC: 0 % (ref 0.0–0.2)

## 2021-10-17 LAB — BASIC METABOLIC PANEL
Anion gap: 6 (ref 5–15)
BUN: 11 mg/dL (ref 8–23)
CO2: 23 mmol/L (ref 22–32)
Calcium: 8.5 mg/dL — ABNORMAL LOW (ref 8.9–10.3)
Chloride: 109 mmol/L (ref 98–111)
Creatinine, Ser: 1.15 mg/dL — ABNORMAL HIGH (ref 0.44–1.00)
GFR, Estimated: 49 mL/min — ABNORMAL LOW (ref >60)
Glucose, Bld: 119 mg/dL — ABNORMAL HIGH (ref 70–99)
Potassium: 3.3 mmol/L — ABNORMAL LOW (ref 3.5–5.1)
Sodium: 138 mmol/L (ref 135–145)

## 2021-10-17 LAB — HEPARIN LEVEL (UNFRACTIONATED)
Heparin Unfractionated: 0.82 IU/mL — ABNORMAL HIGH (ref 0.30–0.70)
Heparin Unfractionated: 0.43 IU/mL (ref 0.30–0.70)

## 2021-10-17 MED ORDER — NITROGLYCERIN 0.4 MG SL SUBL: 0.4000 mg | SUBLINGUAL_TABLET | SUBLINGUAL | 0 refills | Status: DC | PRN

## 2021-10-17 MED ORDER — ASPIRIN 81 MG PO CHEW: 81.0000 mg | CHEWABLE_TABLET | Freq: Every day | ORAL | 0 refills | Status: AC

## 2021-10-17 MED ORDER — CLOPIDOGREL BISULFATE 75 MG PO TABS: 75.0000 mg | ORAL_TABLET | Freq: Every day | ORAL | 0 refills | Status: DC

## 2021-10-17 NOTE — Discharge Instructions (Signed)
**  PLEASE REMEMBER TO BRING ALL OF YOUR MEDICATIONS TO EACH OF YOUR FOLLOW-UP OFFICE VISITS. ° °NO HEAVY LIFTING X 2 WEEKS. °NO SEXUAL ACTIVITY X 2 WEEKS. °NO DRIVING X 1 WEEK. °NO SOAKING BATHS, HOT TUBS, POOLS, ETC., X 7 DAYS. ° °Radial Site Care °Refer to this sheet in the next few weeks. These instructions provide you with information on caring for yourself after your procedure. Your caregiver may also give you more specific instructions. Your treatment has been planned according to current medical practices, but problems sometimes occur. Call your caregiver if you have any problems or questions after your procedure. °HOME CARE INSTRUCTIONS °· You may shower the day after the procedure. Remove the bandage (dressing) and gently wash the site with plain soap and water. Gently pat the site dry.  °· Do not apply powder or lotion to the site.  °· Do not submerge the affected site in water for 3 to 5 days.  °· Inspect the site at least twice daily.  °· Do not flex or bend the affected arm for 24 hours.  °· No lifting over 5 pounds (2.3 kg) for 5 days after your procedure.  °· Do not drive home if you are discharged the same day of the procedure. Have someone else drive you.  ° °What to expect: °· Any bruising will usually fade within 1 to 2 weeks.  °· Blood that collects in the tissue (hematoma) may be painful to the touch. It should usually decrease in size and tenderness within 1 to 2 weeks.  °SEEK IMMEDIATE MEDICAL CARE IF: °· You have unusual pain at the radial site.  °· You have redness, warmth, swelling, or pain at the radial site.  °· You have drainage (other than a small amount of blood on the dressing).  °· You have chills.  °· You have a fever or persistent symptoms for more than 72 hours.  °· You have a fever and your symptoms suddenly get worse.  °· Your arm becomes pale, cool, tingly, or numb.  °· You have heavy bleeding from the site. Hold pressure on the site.  ° °  ° °10 Habits of Highly Healthy  People ° °[facility] wants to help you get well and stay well.  Live a longer, healthier life by practicing healthy habits every day. ° °1.  Visit your primary care provider regularly. °2.  Make time for family and friends.  Healthy relationships are important. °3.  Take medications as directed by your provider. °4.  Maintain a healthy weight and a trim waistline. °5.  Eat healthy meals and snacks, rich in fruits, vegetables, whole grains, and lean proteins. °6.  Get moving every day - aim for 150 minutes of moderate physical activity each week. °7.  Don't smoke. °8.  Avoid alcohol or drink in moderation. °9.  Manage stress through meditation or mindful relaxation. °10.  Get seven to nine hours of quality sleep each night. ° °Want more information on healthy habits?  To learn more about these and other healthy habits, visit [email or web address]. °_____________ °  °  °

## 2021-10-17 NOTE — Progress Notes (Signed)
  Transition of Care [facility]) Screening Note   Patient Details  Name: Victoria Steele Date of Birth: 1940/10/12   Transition of Care [facility]) CM/SW Contact:    Alberteen Sam, LCSW Phone Number: 10/17/2021, 10:36 AM    Transition of Care Department [facility]) has reviewed patient and no TOC needs have been identified at this time. We will continue to monitor patient advancement through interdisciplinary progression rounds. If new patient transition needs arise, please place a TOC consult.  [place], [place]

## 2021-10-17 NOTE — Discharge Summary (Signed)
Physician Discharge Summary   Patient: Victoria Steele MRN: [id number]  DOB: Oct 08, 1940   Admit:     Date of Admission: 10/14/2021 Admitted from: home   Discharge: Date of discharge: 10/17/21 Disposition: Home Condition at discharge: good  CODE STATUS: FULL     Discharge Physician: Emeterio Reeve, DO [facility]     PCP: Letta Median, MD  Recommendations for Outpatient Follow-up:  Follow up with PCP Rebeca Alert, Durene Cal, MD in 1-2 weeks Please obtain labs/tests: CBC, BMP in 1-2 weeks Please follow up on the following pending results: none Followup with cardiology as directed    Discharge Instructions     AMB Referral to Cardiac Rehabilitation - Phase II   Complete by: As directed    Diagnosis:  NSTEMI Coronary Stents     After initial evaluation and assessments completed: Virtual Based Care may be provided alone or in conjunction with Phase 2 Cardiac Rehab based on patient barriers.: Yes   Diet - low sodium heart healthy   Complete by: As directed    Increase activity slowly   Complete by: As directed          Hospital Course:   Victoria Steele is a 81 y.o. 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  She had no associated nausea, vomiting or diaphoresis.  Denies shortness of breath or lower extremity pain or swelling.  Patient had been off her apixaban for 2 days in preparation for her upcoming procedure.  07/04: In ED BP 175/49 with otherwise normal vitals. Labs:First troponin 19. EKG without acute ST-T wave changes. CXR bony metastatic disease w/o acute infiltrate. Tx:  with an inch of Nitropaste with improvement in pain from 8 out of 10 to 2 out of 10.  Hospitalist consulted for admission.   Continued isosorbide, metoprolol, atorvastatin, Nitropaste. Consult to cardiology  07/05: Troponin 19 on admission --> 680 at 05:10 --(started heparin gtt)--> 744 at 06:41 --> 607 at 09:48  07/06: SBP elevated, VS otherwise no concerns. Still low WBC, stable Hgb, slight bump Cr but not severe. Remains on heparin. L heart cath (+) severe 3 vessel CAD, successful PCI to LAD and LCx. Per cardiology: Recommend to resume Rivaroxaban, at currently prescribed dose and frequency on 10/17/2021. Recommend concurrent antiplatelet therapy of Aspirin 81 mg for 1 month and Clopidogrel '75mg'$  daily for 12 months . Ok to Interrupt Plavix after 6 months   Consultants:  Cardiology    Procedures: Cardiac catheterization 10/16/2021      Discharge Diagnoses: Principal Problem:   Unstable angina ([place]) Active Problems:   Essential hypertension   PAD (peripheral artery disease) (HCC)   Hypokalemia   Breast cancer in female [facility])   Chronic anticoagulation    Assessment & Plan:  Unstable angina (HCC) NSTEMI Coronary Artery Disease  Continue isosorbide, metoprolol, atorvastatin, Nitropaste Morphine as needed pain S/p L heart cath 10/16/21 w/ PCI Continue Rivaroxaban, Recommend concurrent antiplatelet therapy of Aspirin 81 mg for 1 month and Clopidogrel '75mg'$  daily for 12 months . Ok to Interrupt Plavix after 6 months  Breast cancer in female [facility]) Currently on Ibrance and letrozole and to receive Zometa, followed by Dr. Janese Banks   PAD (peripheral artery disease) ([place]) Continue atorvastatin  Essential hypertension Continue metoprolol, losartan, amlodipine Holding home HCTZ   Chronic anticoagulation On apixaban secondary to history of DVT 3 years prior with history of thrombectomy Continue apixaban   Hypokalemia Oral repletion and monitor     Discharge Instructions  Allergies as of 10/17/2021       Reactions   Codeine Rash   hallucinations        Medication List     STOP taking these  medications    ibuprofen 400 MG tablet Commonly known as: ADVIL   PreviDent 5000 Booster Plus 1.1 % Pste Generic drug: Sodium Fluoride   prochlorperazine 10 MG tablet Commonly known as: COMPAZINE       TAKE these medications    acetaminophen 500 MG tablet Commonly known as: TYLENOL Take 500 mg by mouth daily as needed for moderate pain or headache.   amLODipine 10 MG tablet Commonly known as: NORVASC Take 10 mg by mouth daily.   apixaban 5 MG Tabs tablet Commonly known as: Eliquis Take 1 tablet (5 mg total) by mouth 2 (two) times daily.   aspirin 81 MG chewable tablet Chew 1 tablet (81 mg total) by mouth daily. STOP THIS MEDICINE AFTER 11/17/2021 Start taking on: October 18, 2021   atorvastatin 40 MG tablet Commonly known as: LIPITOR Take 40 mg by mouth at bedtime.   CALCIUM 600/VITAMIN D3 PO Take 1 tablet by mouth daily.   clopidogrel 75 MG tablet Commonly known as: Plavix Take 1 tablet (75 mg total) by mouth daily. WILL NEED TO TAKE THIS MEDICINE FOR ONE YEAR (UNTIL 10/18/2022) PLEASE ARRANGE REFILLS WITH CARDIOLOGY / PRIMARY CARE Start taking on: October 18, 2021   hydrochlorothiazide 12.5 MG tablet Commonly known as: HYDRODIURIL Take 12.5 mg by mouth daily.   Ibrance 75 MG tablet Generic drug: palbociclib Take 1 tablet (75 mg total) by mouth daily. Take for 21 days on, 7 days off, repeat every 28 days.   isosorbide mononitrate 30 MG 24 hr tablet Commonly known as: IMDUR Take 1 tablet (30 mg total) by mouth daily.   letrozole 2.5 MG tablet Commonly known as: FEMARA Take 1 tablet (2.5 mg total) by mouth daily.   lidocaine-prilocaine cream Commonly known as: EMLA Apply 1 application. topically as needed (port access).   losartan 50 MG tablet Commonly known as: COZAAR Take 1 tablet by mouth once a day  for high blood pressure- to replace 100 mg   metoprolol succinate 25 MG 24 hr tablet Commonly known as: Toprol XL Take 1 tablet (25 mg total) by mouth  daily.   nitroGLYCERIN 0.4 MG SL tablet Commonly known as: NITROSTAT Place 1 tablet (0.4 mg total) under the tongue every 5 (five) minutes x 3 doses as needed for chest pain.          Allergies  Allergen Reactions   Codeine Rash    hallucinations     Subjective: pt feeling well and ready to go home, no CP/SOB   Discharge Exam: Vitals:   10/17/21 0732 10/17/21 0914  BP: (!) 131/50 (!) 121/38  Pulse: 63 67  Resp: 18   Temp: 98.4 F (36.9 C)   SpO2: 95%    Vitals:   10/17/21 0500 10/17/21 0600 10/17/21 0732 10/17/21 0914  BP: (!) 126/50 126/60 (!) 131/50 (!) 121/38  Pulse: (!) 56 (!) 58 63 67  Resp: (!) 22 (!) 21 18   Temp:   98.4 F (36.9 C)   TempSrc:   Oral   SpO2: 96% 96%  95%   Weight:      Height:         General: Pt is alert, awake, not in acute distress Cardiovascular: RRR, S1/S2 +, no rubs, no gallops Respiratory: CTA bilaterally, no wheezing, no rhonchi Abdominal: Soft, NT, ND, bowel sounds + Extremities: no edema, no cyanosis     The results of significant diagnostics from this hospitalization (including imaging, microbiology, ancillary and laboratory) are listed below for reference.     Microbiology: Recent Results (from the past 240 hour(s))  MRSA Next Gen by PCR, Nasal     Status: None   Collection Time: 10/15/21  9:16 PM   Specimen: Nasal Mucosa; Nasal Swab  Result Value Ref Range Status   MRSA by PCR Next Gen NOT DETECTED NOT DETECTED Final    Comment: (NOTE) The GeneXpert MRSA Assay (FDA approved for NASAL specimens only), is one component of a comprehensive MRSA colonization surveillance program. It is not intended to diagnose MRSA infection nor to guide or monitor treatment for MRSA infections. Test performance is not FDA approved in patients less than 79 years old. Performed at [facility], [address] [place], [place] [address]      Labs: BNP (last 3 results) No results for input(s): "BNP" in the last 8760  hours. Basic Metabolic Panel: Recent Labs  Lab 10/10/21 1324 10/14/21 2247 10/16/21 0747 10/17/21 0122  NA 138 140 137 138  K 3.5 3.2* 3.6 3.3*  CL 103 105 108 109  CO2 '26 23 23 23  '$ GLUCOSE 192* 202* 152* 119*  BUN '14 13 12 11  '$ CREATININE 0.99 1.10* 1.20* 1.15*  CALCIUM 9.5 9.8 9.1 8.5*   Liver Function Tests: Recent Labs  Lab 10/10/21 1324 10/14/21 2247  AST 35 40  ALT 24 27  ALKPHOS 55 77  BILITOT 1.4* 1.2  PROT 7.0 7.9  ALBUMIN 3.6 4.0   Recent Labs  Lab 10/14/21 2247  LIPASE 36   No results for input(s): "AMMONIA" in the last 168 hours. CBC: Recent Labs  Lab 10/10/21 1324 10/14/21 2247 10/16/21 0747 10/17/21 0122  WBC 2.4* 3.3* 2.8* 2.8*  NEUTROABS 1.2* 1.9  --   --   HGB 11.9* 12.1 11.0* 11.0*  HCT 33.5* 35.3* 32.1* 31.8*  MCV 106.7* 106.6* 106.3* 107.4*  PLT 163 209 235 227   Cardiac Enzymes: No results for input(s): "CKTOTAL", "CKMB", "CKMBINDEX", "TROPONINI" in the last 168 hours. BNP: Invalid input(s): "POCBNP" CBG: Recent Labs  Lab 10/15/21 2114  GLUCAP 150*   D-Dimer No results for input(s): "DDIMER" in the last 72 hours. Hgb A1c No results for input(s): "HGBA1C" in the last 72 hours. Lipid Profile No results for input(s): "CHOL", "HDL", "LDLCALC", "TRIG", "CHOLHDL", "LDLDIRECT" in the last 72 hours. Thyroid function studies No results for input(s): "TSH", "T4TOTAL", "T3FREE", "THYROIDAB" in the last 72 hours.  Invalid input(s): "FREET3" Anemia work up No results for input(s): "VITAMINB12", "FOLATE", "FERRITIN", "TIBC", "IRON", "RETICCTPCT" in the last 72 hours. Urinalysis    Component Value Date/Time   COLORURINE YELLOW (A) 10/09/2016 1526   APPEARANCEUR CLEAR (A) 10/09/2016 1526   LABSPEC 1.014 10/09/2016 1526   PHURINE 6.0 10/09/2016 1526   GLUCOSEU NEGATIVE 10/09/2016 1526   HGBUR SMALL (A) 10/09/2016 1526   BILIRUBINUR NEGATIVE 10/09/2016 [place] 10/09/2016 1526   PROTEINUR NEGATIVE 10/09/2016 1526    NITRITE NEGATIVE 10/09/2016 [place] 10/09/2016 1526   Sepsis Labs Recent Labs  Lab 10/10/21 1324 10/14/21 2247 10/16/21  8421 10/17/21 0122  WBC 2.4* 3.3* 2.8* 2.8*   Microbiology Recent Results (from the past 240 hour(s))  MRSA Next Gen by PCR, Nasal     Status: None   Collection Time: 10/15/21  9:16 PM   Specimen: Nasal Mucosa; Nasal Swab  Result Value Ref Range Status   MRSA by PCR Next Gen NOT DETECTED NOT DETECTED Final    Comment: (NOTE) The GeneXpert MRSA Assay (FDA approved for NASAL specimens only), is one component of a comprehensive MRSA colonization surveillance program. It is not intended to diagnose MRSA infection nor to guide or monitor treatment for MRSA infections. Test performance is not FDA approved in patients less than 37 years old. Performed at [facility], [address], [place], [place] [address]    Imaging DG Chest [place] 1 View  Result Date: 10/15/2021 CLINICAL DATA:  Chest pain for several hours EXAM: PORTABLE CHEST 1 VIEW COMPARISON:  10/15/2016 FINDINGS: Cardiac shadow is enlarged but stable. Right chest wall port is noted in satisfactory position. Lungs are well aerated bilaterally. No focal confluent infiltrate is seen. Patchy sclerotic foci are noted throughout the bony structures consistent with metastatic disease. IMPRESSION: Changes consistent with bony metastatic disease. No acute infiltrate is seen. Electronically Signed   By: Inez Catalina M.D.   On: 10/15/2021 00:01      Time coordinating discharge: Over 30 minutes  SIGNED:  Emeterio Reeve DO [facility]

## 2021-10-17 NOTE — Progress Notes (Deleted)
  Transition of Care [facility]) Screening Note   Patient Details  Name: Victoria Steele Date of Birth: 1940/10/20   Transition of Care [facility]) CM/SW Contact:    Alberteen Sam, LCSW Phone Number: 10/17/2021, 12:07 PM    Transition of Care Department [facility]) has reviewed patient and no TOC needs have been identified at this time. We will continue to monitor patient advancement through interdisciplinary progression rounds. If new patient transition needs arise, please place a TOC consult.  [place], [place]

## 2021-10-17 NOTE — Progress Notes (Addendum)
ANTICOAGULATION CONSULT NOTE  Pharmacy Consult for IV Heparin  Indication: chest pain/ACS  Patient Measurements: Height: '5\' 2"'$  (157.5 cm) Weight: 91.2 kg (201 lb 1 oz) IBW/kg (Calculated) : 50.1 Heparin Dosing Weight: 71.1 kg   Labs: Recent Labs    10/14/21 2247 10/15/21 0510 10/15/21 0641 10/15/21 0948 10/15/21 1516 10/15/21 2318 10/16/21 0747 10/17/21 0122  HGB 12.1  --   --   --   --   --  11.0* 11.0*  HCT 35.3*  --   --   --   --   --  32.1* 31.8*  PLT 209  --   --   --   --   --  235 227  APTT  --   --  29  --   --   --   --   --   LABPROT  --   --  14.4  --   --   --   --   --   INR  --   --  1.1  --   --   --   --   --   HEPARINUNFRC  --   --  0.22*  --    < > 0.59 0.56 0.82*  CREATININE 1.10*  --   --   --   --   --  1.20* 1.15*  TROPONINIHS 19* 680* 744* 607*  --   --   --   --    < > = values in this interval not displayed.     Estimated Creatinine Clearance: 42.3 mL/min (A) (by C-G formula based on SCr of 1.15 mg/dL (H)).   Medical History: Past Medical History:  Diagnosis Date   Breast cancer ([place])    Cancer ([place])    left breast   Coronary artery disease 08/30/2021   Coronary Calcium Score 788.  Combination of calcified and noncalcified plaque-severe lesion in mid RCA (estimated greater than 70%).  Moderate LAD and LCx 50 to 69%). => CAD RADS 4 with severe stenosis suggested in the RCA.  FFRCT not submitted due to motion artifact.-Recommended cardiac catheterization.   DVT (deep venous thrombosis) ([place])    2019   Hypertension    Port catheter in place 09/14/2016   Placed 09/10/2016 RT chest.    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 Pharmacy consulted to initiate and manage heparin infusion for ACS. Home apixaban on hold currently, appears patient has not been taking this as outpatient most recently given plan for catheterization.  Patient went for  cardiac catheterization 7/6 with findings of 3-vessel CAD and PCI was performed with stenting x 2.   7/5 0641 HL = 0.22 7/5 1516 HL = 0.59 7/5 2318 HL = 0.59, therapeutic x 2 7/6 0747 HL = 0.56, therapeutic x 3 7/7 0122 HL = 0.82, elevated   Goal of Therapy:  Heparin level 0.3-0.7 units/ml Monitor platelets by anticoagulation protocol: Yes   Plan:  7/7:  HL @ 0122 = 0.82, elevated  Will decrease drip rate to 700 units/hr. Will recheck HL 8 hrs after rate change.   -  Heparin gtt scheduled to d/c @ 0900 and Eliquis to start.  Will need to cancel 1000 order for HL if heparin gtt is d/c'd.   Kye Silverstein D 10/17/2021,2:11 AM

## 2021-10-17 NOTE — Progress Notes (Signed)
Cardiology Progress Note   Patient Name: Victoria Steele Date of Encounter: 10/17/2021  Primary Cardiologist: Glenetta Hew, MD  Subjective   Feels well this AM w/o dyspnea or chest pain. She hasn't ambulated yet, but is looking forward to going home.  Inpatient Medications    Scheduled Meds:  amLODipine  10 mg Oral Daily   apixaban  5 mg Oral BID   aspirin  81 mg Oral Daily   atorvastatin  40 mg Oral QHS   Chlorhexidine Gluconate Cloth  6 each Topical Daily   isosorbide mononitrate  30 mg Oral Daily   letrozole  2.5 mg Oral Daily   losartan  50 mg Oral Daily   metoprolol succinate  25 mg Oral Daily   palbociclib  75 mg Oral Daily   sodium chloride flush  3 mL Intravenous Q12H   Continuous Infusions:  sodium chloride     PRN Meds: sodium chloride, acetaminophen, acetaminophen, nitroGLYCERIN, ondansetron (ZOFRAN) IV, ondansetron (ZOFRAN) IV, mouth rinse, sodium chloride flush   Vital Signs    Vitals:   10/17/21 0500 10/17/21 0600 10/17/21 0732 10/17/21 0914  BP: (!) 126/50 126/60 (!) 131/50 (!) 121/38  Pulse: (!) 56 (!) 58 63 67  Resp: (!) 22 (!) 21 18   Temp:   98.4 F (36.9 C)   TempSrc:   Oral   SpO2: 96% 96% 95%   Weight:      Height:        Intake/Output Summary (Last 24 hours) at 10/17/2021 1038 Last data filed at 10/17/2021 0900 Gross per 24 hour  Intake 892.09 ml  Output 1850 ml  Net -957.91 ml   Filed Weights   10/14/21 2249 10/15/21 2110  Weight: 90.7 kg 91.2 kg    Physical Exam   GEN: Well nourished, well developed, in no acute distress.  HEENT: Grossly normal.  Neck: Supple, no JVD, carotid bruits, or masses. Cardiac: RRR, no murmurs, rubs, or gallops. No clubbing, cyanosis, edema.  R Radials 1+, R ulnar 2+, DP/PT 2+ and equal bilaterally.  Right radial cath site w/o bleeding/bruit/hematoma. Respiratory:  Respirations regular and unlabored, diminished breath sounds bilat. GI: Soft, nontender, nondistended, BS + x 4. MS: no deformity or  atrophy. Skin: warm and dry, no rash. Neuro:  Strength and sensation are intact. Psych: AAOx3.  Normal affect.  Labs    Chemistry Recent Labs  Lab 10/10/21 1324 10/14/21 2247 10/16/21 0747 10/17/21 0122  NA 138 140 137 138  K 3.5 3.2* 3.6 3.3*  CL 103 105 108 109  CO2 '26 23 23 23  '$ GLUCOSE 192* 202* 152* 119*  BUN '14 13 12 11  '$ CREATININE 0.99 1.10* 1.20* 1.15*  CALCIUM 9.5 9.8 9.1 8.5*  PROT 7.0 7.9  --   --   ALBUMIN 3.6 4.0  --   --   AST 35 40  --   --   ALT 24 27  --   --   ALKPHOS 55 77  --   --   BILITOT 1.4* 1.2  --   --   GFRNONAA 58* 51* 46* 49*  ANIONGAP '9 12 6 6     '$ Hematology Recent Labs  Lab 10/14/21 2247 10/16/21 0747 10/17/21 0122  WBC 3.3* 2.8* 2.8*  RBC 3.31* 3.02* 2.96*  HGB 12.1 11.0* 11.0*  HCT 35.3* 32.1* 31.8*  MCV 106.6* 106.3* 107.4*  MCH 36.6* 36.4* 37.2*  MCHC 34.3 34.3 34.6  RDW 15.3 15.4 15.4  PLT 209 235 227  Cardiac Enzymes  Recent Labs  Lab 10/14/21 2247 10/15/21 0510 10/15/21 0641 10/15/21 0948  TROPONINIHS 19* 680* 744* 607*     HbA1c  Lab Results  Component Value Date   HGBA1C 5.3 09/02/2017    Radiology    DG Chest Port 1 View  Result Date: 10/15/2021 CLINICAL DATA:  Chest pain for several hours EXAM: PORTABLE CHEST 1 VIEW COMPARISON:  10/15/2016 FINDINGS: Cardiac shadow is enlarged but stable. Right chest wall port is noted in satisfactory position. Lungs are well aerated bilaterally. No focal confluent infiltrate is seen. Patchy sclerotic foci are noted throughout the bony structures consistent with metastatic disease. IMPRESSION: Changes consistent with bony metastatic disease. No acute infiltrate is seen. Electronically Signed   By: Inez Catalina M.D.   On: 10/15/2021 00:01    Telemetry    rsr - Personally Reviewed  Cardiac Studies   2D Echocardiogram  6.21.2023  1. Left ventricular ejection fraction, by estimation, is 60 to 65%. The  left ventricle has normal function. The left ventricle has no  regional  wall motion abnormalities. Left ventricular diastolic parameters are  consistent with Grade I diastolic  dysfunction (impaired relaxation).   2. Right ventricular systolic function is normal. The right ventricular  size is normal.   3. The mitral valve is normal in structure. No evidence of mitral valve  regurgitation.   4. The aortic valve is normal in structure. Aortic valve regurgitation is  not visualized.  _____________   Cardiac Catheterization and Percutaneous Coronary Intervention 7.6.2023  Left Anterior Descending  Prox LAD to Mid LAD lesion is 75% stenosed. Vessel is the culprit lesion. The lesion is type A, located at the major branch and eccentric.      **successfully treated w/ 3.0 x 24m Onyx Frontier DES**  Mid LAD lesion is 50% stenosed.  First Diagonal Branch  Vessel is small in size.  First Septal Branch  Vessel is small in size.  Second Diagonal Branch  Vessel is small in size.  Second Septal Branch  Vessel is small in size.  Third Septal Branch  Vessel is small in size.  Left Circumflex  Prox Cx to Mid Cx lesion is 75% stenosed. Vessel is not the culprit lesion. The lesion is type B1, located at the major branch, focal, eccentric and irregular.      **successfully treated w/ 2.5 x 154mOnyx Frontier DES  First Obtuse Marginal Branch  Vessel is small in size.  Third Obtuse Marginal Branch  Vessel is small in size.  Left Posterior Atrioventricular Artery  Vessel is small in size.  Right Coronary Artery  Prox RCA to Mid RCA lesion is 100% stenosed.  Acute Marginal Branch  Collaterals  Acute Mrg filled by collaterals from RV Branch.    Right Ventricular Branch  Vessel is small in size.  Right Posterior Descending Artery  Collaterals  RPDA filled by collaterals from 3rd Sept.    Collaterals  RPDA filled by collaterals from Dist LAD.    Patient Profile     [age].o. 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  Assessment & Plan    1.  NSTEMI/CAD:  [place] outpt [place], however, she presented 7/4 w/ worsening c/p and ruled in for NSTEMI.  Caht 7/6 w/ CTO of the RCA w/ L  R collats, and severe LAD and LCX dzs, now s/p PCI/DES to the  LAD and LCX.  No c/p or dyspnea overnight.  R wrist looks good.  Ambulate w/ plan for d/c this AM.  Eventual cardiac rehab.  Cont asa x 30 days, plavix, ? blocker, statin, and nitrate therapy.  We will see her back in clinic on 7/19.  2.  Essential HTN:  stable on ? blocker, arb, and nitrate rx.  3.  HL:  cont statin rx.  No lipids on file.  4.  H/o DVT:  eliquis resumed this AM.  5.  PAD:  no recent claudication.  Cont statin rx.  Signed, Murray Hodgkins, NP  10/17/2021, 10:38 AM    For questions or updates, please contact   Please consult [email or web address] for contact info under Cardiology/STEMI.

## 2021-10-22 ENCOUNTER — Other Ambulatory Visit (HOSPITAL_COMMUNITY): Payer: Self-pay

## 2021-10-24 ENCOUNTER — Other Ambulatory Visit (HOSPITAL_COMMUNITY): Payer: Self-pay

## 2021-10-29 ENCOUNTER — Encounter: Payer: Self-pay | Admitting: Nurse Practitioner

## 2021-10-29 ENCOUNTER — Ambulatory Visit: Payer: Medicare Other | Admitting: Nurse Practitioner

## 2021-10-29 VITALS — BP 160/58 | HR 82 | Ht 62.0 in | Wt 196.4 lb

## 2021-10-29 DIAGNOSIS — I2510 Atherosclerotic heart disease of native coronary artery without angina pectoris: Secondary | ICD-10-CM | POA: Diagnosis not present

## 2021-10-29 DIAGNOSIS — I10 Essential (primary) hypertension: Secondary | ICD-10-CM | POA: Diagnosis not present

## 2021-10-29 DIAGNOSIS — Z86718 Personal history of other venous thrombosis and embolism: Secondary | ICD-10-CM

## 2021-10-29 DIAGNOSIS — I214 Non-ST elevation (NSTEMI) myocardial infarction: Secondary | ICD-10-CM

## 2021-10-29 DIAGNOSIS — E785 Hyperlipidemia, unspecified: Secondary | ICD-10-CM

## 2021-10-29 NOTE — Progress Notes (Signed)
Office Visit    Patient Name: Victoria Steele Date of Encounter: 10/29/2021  Primary Care Provider:  Letta Median, MD Primary Cardiologist:  Victoria Hew, MD  Chief Complaint    81 year old female with a history of peripheral arterial disease, hypertension, hyperlipidemia, DVT on chronic Eliquis, metastatic breast cancer, and recent diagnosis of unstable angina and CAD, who presents for follow-up after recent non-STEMI.  Past Medical History    Past Medical History:  Diagnosis Date   Coronary artery disease 08/30/2021   a. 08/2021 Cor CTA: Cor Ca2+ = 788.  Sev mRCA dzs, Mod LAD and LCx dzs; b. 10/2021 PCI: LM nl, LAD 75/pm (3.0x15 Onyx Frontier DES), 39m LCX 75p (2.5x18 Onyx Frontier DES), RCA 100p/m w/ R->R and L->R collats to distal vessel/RPDA.   Diastolic dysfunction    a. 09/2021 Echo: EF 60-65%, no rwma, GrI DD, nl RV fxn.   DVT (deep venous thrombosis) ([facility]    2019   Hypertension    Left Breast cancer (Victoria Steele    Port catheter in place 09/14/2016   Placed 09/10/2016 RT chest.   Past Surgical History:  Procedure Laterality Date   ABDOMINAL HYSTERECTOMY     APPENDECTOMY     BREAST BIOPSY     CORONARY STENT INTERVENTION N/A 10/16/2021   Procedure: CORONARY STENT INTERVENTION;  Surgeon: HLeonie Man MD;  [facility];  Service: Cardiovascular;  Laterality: N/A;   CT CTA CORONARY W/CA SCORE W/CM &/OR WO/CM  09/01/2021   Coronary Calcium Score 788.  Combination of calcified and noncalcified plaque-severe lesion in mid RCA (estimated greater than 70%).  Moderate LAD and LCx 50 to 69%). => CAD RADS 4 with severe stenosis suggested in the RCA.  FFRCT not submitted due to motion artifact.-Recommended cardiac catheterization.   CYST EXCISION Right 09/2016   Buttocks   IVC FILTER REMOVAL N/A 01/17/2018   Procedure: IVC FILTER REMOVAL;  Surgeon: Victoria Huxley MD;  [facility];  Service: Cardiovascular;  Laterality: N/A;   LEFT HEART  CATH AND CORONARY ANGIOGRAPHY N/A 10/16/2021   Procedure: LEFT HEART CATH AND CORONARY ANGIOGRAPHY;  Surgeon: HLeonie Man MD;  [facility];  Service: Cardiovascular;  Laterality: N/A;   LOWER EXTREMITY ANGIOGRAPHY Right 11/11/2017   Procedure: LOWER EXTREMITY ANGIOGRAPHY;  Surgeon: Victoria Cabal MD;  [facility];  Service: Cardiovascular;  Laterality: Right;   PERIPHERAL VASCULAR THROMBECTOMY Right 08/30/2017   Procedure: PERIPHERAL VASCULAR THROMBECTOMY;  Surgeon: Victoria Huxley MD;  [facility];  Service: Cardiovascular;  Laterality: Right;   PORTACATH PLACEMENT Right 09/10/2016   Procedure: INSERTION PORT-A-CATH;  Surgeon: Victoria Husbands MD;  Location: [facility];  Service: General;  Laterality: Right;    Allergies  Allergies  Allergen Reactions   Codeine Rash    hallucinations    History of Present Illness    81year old female with the above past medical history including peripheral arterial disease, hypertension, hyperlipidemia, DVT on chronic Eliquis, metastatic breast cancer, and coronary artery disease.  She was seen in cardiology clinic in mid May in the setting of precordial chest pain with both typical and atypical features.  She subsequently underwent coronary CT angiogram which showed severe mid RCA disease as well as moderate LAD and circumflex disease.  Echocardiogram showed normal LV function with grade 1 diastolic dysfunction.  Decision was made to pursue diagnostic catheterization on an outpatient basis.  Unfortunately, patient presented to  the hospital on July 4 after developing chest pain.  She ruled in for non-STEMI, peak and her troponin is 744.  Eliquis was held and she was placed on heparin.  She underwent diagnostic catheterization which revealed a chronic total occlusion of the right coronary artery with right to right and left-to-right collaterals.  She also had severe disease in the proximal/mid LAD and  proximal left circumflex.  Both arteries were successfully treated with drug-eluting stents.  She was discharged home on July 7 on aspirin x30 days, Plavix, and her usual dose of Eliquis.  Since d/c, Ms. Steele has felt well.  She has been busy in and around her home though does not routinely exercise.  She denies chest pain, dyspnea, palpitations, PND, orthopnea, dizziness, syncope, edema, or early satiety.  Her blood pressure is elevated today.  Review of hospital records shows that she ran more normally during that time, and she says that she is typically in the 120s at primary care visits.  She previously felt fatigued on the higher dose of losartan and is not interested in adjusting medications today.  She did receive call from cardiac rehab and notes that she is not interested in attending, as she plans exercise at home.  Home Medications    Current Outpatient Medications  Medication Sig Dispense Refill   acetaminophen (TYLENOL) 500 MG tablet Take 500 mg by mouth daily as needed for moderate pain or headache.      apixaban (ELIQUIS) 5 MG TABS tablet Take 1 tablet (5 mg total) by mouth 2 (two) times daily. 60 tablet 6   aspirin 81 MG chewable tablet Chew 1 tablet (81 mg total) by mouth daily. STOP THIS MEDICINE AFTER 11/17/2021 30 tablet 0   atorvastatin (LIPITOR) 40 MG tablet Take 40 mg by mouth at bedtime.     Calcium Carb-Cholecalciferol (CALCIUM 600/VITAMIN D3 PO) Take 1 tablet by mouth daily.     clopidogrel (PLAVIX) 75 MG tablet Take 1 tablet (75 mg total) by mouth daily. WILL NEED TO TAKE THIS MEDICINE FOR ONE YEAR (UNTIL 10/18/2022) PLEASE ARRANGE REFILLS WITH CARDIOLOGY / PRIMARY CARE 30 tablet 0   isosorbide mononitrate (IMDUR) 30 MG 24 hr tablet Take 1 tablet (30 mg total) by mouth daily. 30 tablet 6   letrozole (FEMARA) 2.5 MG tablet Take 1 tablet (2.5 mg total) by mouth daily. 90 tablet 5   lidocaine-prilocaine (EMLA) cream Apply 1 application. topically as needed (port access). 30 g  2   losartan (COZAAR) 50 MG tablet Take 1 tablet by mouth once a day  for high blood pressure- to replace 100 mg     metoprolol succinate (TOPROL XL) 25 MG 24 hr tablet Take 1 tablet (25 mg total) by mouth daily. 30 tablet 6   nitroGLYCERIN (NITROSTAT) 0.4 MG SL tablet Place 1 tablet (0.4 mg total) under the tongue every 5 (five) minutes x 3 doses as needed for chest pain. 30 tablet 0   palbociclib (IBRANCE) 75 MG tablet Take 1 tablet (75 mg total) by mouth daily. Take for 21 days on, 7 days off, repeat every 28 days. 21 tablet 3   Current Facility-Administered Medications  Medication Dose Route Frequency Provider Last Rate Last Admin   sodium chloride flush (NS) 0.9 % injection 3 mL  3 mL Intravenous Q12H Victoria Man, MD         Review of Systems    She denies chest pain, palpitations, dyspnea, pnd, orthopnea, n, v, dizziness, syncope, edema, weight gain,  or early satiety.  All other systems reviewed and are otherwise negative except as noted above.    Physical Exam    VS:  BP (!) 158/60 (BP Location: Left Arm, Patient Position: Sitting, Cuff Size: Large)   Pulse 82   Ht '5\' 2"'$  (1.575 m)   Wt 196 lb 6 oz (89.1 kg)   SpO2 98%   BMI 35.92 kg/m  , BMI Body mass index is 35.92 kg/m.     Vitals:   10/29/21 0959 10/29/21 1031  BP: (!) 158/60 (!) 160/58  Pulse: 82   SpO2: 98%     GEN: Well nourished, well developed, in no acute distress. HEENT: normal. Neck: Supple, no JVD, carotid bruits, or masses. Cardiac: RRR, no murmurs, rubs, or gallops. No clubbing, cyanosis, edema.  Radials/PT 2+ and equal bilaterally.  Right radial catheterization site without bleeding, bruit, or hematoma. Respiratory:  Respirations regular and unlabored, clear to auscultation bilaterally. GI: Soft, nontender, nondistended, BS + x 4. MS: no deformity or atrophy. Skin: warm and dry, no rash. Neuro:  Strength and sensation are intact. Psych: Normal affect.  Accessory Clinical Findings    ECG  personally reviewed by me today - sinus brady, 57, RBBB - no acute changes.  Lab Results  Component Value Date   WBC 2.8 (L) 10/17/2021   HGB 11.0 (L) 10/17/2021   HCT 31.8 (L) 10/17/2021   MCV 107.4 (H) 10/17/2021   PLT 227 10/17/2021   Lab Results  Component Value Date   CREATININE 1.15 (H) 10/17/2021   BUN 11 10/17/2021   NA 138 10/17/2021   K 3.3 (L) 10/17/2021   CL 109 10/17/2021   CO2 23 10/17/2021   Lab Results  Component Value Date   ALT 27 10/14/2021   AST 40 10/14/2021   ALKPHOS 77 10/14/2021   BILITOT 1.2 10/14/2021     Lab Results  Component Value Date   HGBA1C 5.3 09/02/2017    Assessment & Plan    1.  Non-STEMI, subsequent episode of care/coronary artery disease: Patient with recent unstable angina and abnormal coronary CTA and plan for outpatient diagnostic catheterization however, she developed worsening chest pain on July 4, prompting admission, during which she ruled in for non-STEMI.  Diagnostic catheterization showed a chronic total occlusion of the right coronary artery with left-to-right collaterals, along with severe LAD and circumflex disease.  She is status post PCI and drug-eluting stent placement to both the LAD and circumflex.  She has done well since discharge without chest pain or dyspnea.  She declined cardiac rehabilitation noting that she has too much to do at home.  She will remain on aspirin for an additional 2-1/2 weeks (also on Eliquis), along with Plavix, beta-blocker, statin, and nitrate.  2.  Essential hypertension: Blood pressure is elevated today at 158/60 and then 160/58 on repeat.  Blood pressures during hospitalization were stable in the 120s for the most part.  She notes that she is typically in the 120s at prior outpatient visits.  She agreed to check her blood pressure at home for the next 2 weeks and contact us with trends.  In the meantime, she will remain on beta-blocker, ARB, and nitrate therapy.  3.  Hyperlipidemia:  Traditionally followed by her primary care provider.  Goal LDL is now less than 70 in the setting of recent ACS.  Continue high potency statin therapy and consider escalation if necessary.  4.  History of DVT: On chronic Eliquis.  5.  Peripheral arterial disease:  No recent claudication.  Continue statin and antiplatelet therapy.  6.  Morbid obesity: I encouraged enrollment in cardiac rehabilitation however, she has declined.  She plans to exercise at home and notes that she has multiple chores to do at home.  7.  Disposition: Follow-up in clinic in 1 month or sooner if necessary.   Murray Hodgkins, NP 10/29/2021, 10:13 AM

## 2021-10-29 NOTE — Patient Instructions (Signed)
Medication Instructions:  Your physician recommends that you continue on your current medications as directed. Please refer to the Current Medication list given to you today.  *If you need a refill on your cardiac medications before your next appointment, please call your pharmacy*   Lab Work: None ordered If you have labs (blood work) drawn today and your tests are completely normal, you will receive your results only by: [place] (if you have MyChart) OR A paper copy in the mail If you have any lab test that is abnormal or we need to change your treatment, we will call you to review the results.   Testing/Procedures: None ordered   Follow-Up: At [facility], you and your health needs are our priority.  As part of our continuing mission to provide you with exceptional heart care, we have created designated Provider Care Teams.  These Care Teams include your primary Cardiologist (physician) and Advanced Practice Providers (APPs -  Physician Assistants and Nurse Practitioners) who all work together to provide you with the care you need, when you need it.  We recommend signing up for the patient portal called "MyChart".  Sign up information is provided on this After Visit Summary.  MyChart is used to connect with patients for Virtual Visits (Telemedicine).  Patients are able to view lab/test results, encounter notes, upcoming appointments, etc.  Non-urgent messages can be sent to your provider as well.   To learn more about what you can do with MyChart, go to [email or web address].    Your next appointment:   4 week(s)  The format for your next appointment:   In Person  Provider:   You may see Glenetta Hew, MD or one of the following Advanced Practice Providers on your designated Care Team:   Murray Hodgkins, NP  Other Instructions N/A  Important Information About Sugar

## 2021-11-13 ENCOUNTER — Encounter: Payer: Self-pay | Admitting: Cardiology

## 2021-11-13 ENCOUNTER — Ambulatory Visit: Payer: Medicare Other | Admitting: Cardiology

## 2021-11-13 VITALS — BP 162/72 | HR 61 | Ht 62.0 in | Wt 195.6 lb

## 2021-11-13 DIAGNOSIS — Z7901 Long term (current) use of anticoagulants: Secondary | ICD-10-CM

## 2021-11-13 DIAGNOSIS — I25110 Atherosclerotic heart disease of native coronary artery with unstable angina pectoris: Secondary | ICD-10-CM | POA: Diagnosis not present

## 2021-11-13 DIAGNOSIS — I209 Angina pectoris, unspecified: Secondary | ICD-10-CM | POA: Diagnosis not present

## 2021-11-13 DIAGNOSIS — I214 Non-ST elevation (NSTEMI) myocardial infarction: Secondary | ICD-10-CM

## 2021-11-13 DIAGNOSIS — I10 Essential (primary) hypertension: Secondary | ICD-10-CM | POA: Diagnosis not present

## 2021-11-13 DIAGNOSIS — I82511 Chronic embolism and thrombosis of right femoral vein: Secondary | ICD-10-CM

## 2021-11-13 DIAGNOSIS — R0609 Other forms of dyspnea: Secondary | ICD-10-CM

## 2021-11-13 MED ORDER — CLOPIDOGREL BISULFATE 75 MG PO TABS: 75.0000 mg | ORAL_TABLET | Freq: Every day | ORAL | 3 refills | Status: AC

## 2021-11-13 NOTE — Patient Instructions (Signed)
Medication Instructions:  - Your physician recommends that you continue on your current medications as directed. Please refer to the Current Medication list given to you today.  *If you need a refill on your cardiac medications before your next appointment, please call your pharmacy*   Lab Work: - none ordered  If you have labs (blood work) drawn today and your tests are completely normal, you will receive your results only by: [place] (if you have MyChart) OR A paper copy in the mail If you have any lab test that is abnormal or we need to change your treatment, we will call you to review the results.   Testing/Procedures: - none ordered   Follow-Up: At [facility], you and your health needs are our priority.  As part of our continuing mission to provide you with exceptional heart care, we have created designated Provider Care Teams.  These Care Teams include your primary Cardiologist (physician) and Advanced Practice Providers (APPs -  Physician Assistants and Nurse Practitioners) who all work together to provide you with the care you need, when you need it.  We recommend signing up for the patient portal called "MyChart".  Sign up information is provided on this After Visit Summary.  MyChart is used to connect with patients for Virtual Visits (Telemedicine).  Patients are able to view lab/test results, encounter notes, upcoming appointments, etc.  Non-urgent messages can be sent to your provider as well.   To learn more about what you can do with MyChart, go to [email or web address].    Your next appointment:   5 month(s)  The format for your next appointment:   In Person  Provider:   Glenetta Hew, MD    Other Instructions  Keep a close eye/ blood pressure log for 1-2 weeks before you see your Primary Care Doctor and take these readings with you when you see them  Important Information About Sugar

## 2021-11-13 NOTE — Progress Notes (Signed)
Primary Care Provider: Letta Median, MD Cardiologist: Glenetta Hew, MD Electrophysiologist: None  Clinic Note: Chief Complaint  Patient presents with   Hospitalization Follow-up    4 week follow up. Patient states that she is doing good.  Meds reviewed with patient.    Coronary Artery Disease    No further angina after two-vessel PCI.    ===================================  ASSESSMENT/PLAN   Problem List Items Addressed This Visit       Cardiology Problems   Angina, class III (HCC) (Chronic)    Initial plan was cardiac catheterization for class III symptoms that quickly became class IV ACS symptoms leading to hospitalization with staged PCI.  No further anginal pain.  Has taken nitroglycerin once or twice but not initially with benefit.  Continue Imdur and Toprol along with losartan.  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      Coronary artery disease involving native coronary artery of native heart with unstable angina pectoris (HCC) - Primary (Chronic)    Abnormal Coronary CTA with planned cardiac catheterization, presented with non-STEMI: CTO of RCA with left to right collaterals and bridging collaterals.  Mid LAD 75% and LCx 75%. => Both lesions treated with DES PCI  Plan: Continue atorvastatin 40 mg daily but with elevated LPa -may want to consider referral for PCSK9..  Due for lab recheck for lipids. Continue Imdur and metoprolol for anginal management.  If BP continues to be elevated would probably consider adding amlodipine as opposed diuretic.  If amlodipine is added, we can probably discontinue Imdur and still have room for HCTZ. On triple therapy with 1 month of DAPT plus Eliquis followed by Plavix alone with Eliquis to complete 1 full year. Telemetry oh refilled.      Essential hypertension  (Chronic)    BP is high today.  I will a concern.  She says that if not at home in the 150/75 to 120/80.  I got about the same reading on my recheck.  She seems happy and not anxious.  I have asked that she monitor her pressures.  Next option will be to reinitiate HCTZ versus potentially amlodipine depending on her symptoms.  She actually also has room to titrate up Imdur, but heart rate of 61, no room to on Toprol.      DVT (deep venous thrombosis) (HCC) (Chronic)    Longstanding issue on chronic Eliquis.. Plan was triple therapy with aspirin Plavix and Eliquis 1 month after PCI and then stop aspirin.  Continue Plavix to complete 1 year before stopping.  Within continue lifelong Eliquis.      NSTEMI (non-ST elevated myocardial infarction) ([place]) (Chronic)    Recently admitted for non-STEMI.  Patient missed doses late noted in 2 vessels and potentially occlusion of the RCA which look to be chronic with collaterals.  Status post two-vessel PCI.  No further symptoms. Echocardiogram was done before cath, may be reasonable to reassess in 6 months.        Other   DOE (dyspnea on exertion) (Chronic)    Improved post PCI.  Echo looked relatively good.  I do not think she would have had a much bit.  Can probably reassess EF in about 6 months once we have further stabilized her.      Chronic anticoagulation    Has been on Eliquis long-term.  He now currently on triple therapy.  We will continue Eliquis predominantly over W-2, but would like to at least 6 months of clopidogrel prior to switching to just Eliquis alone.       ===================================  HPI:    Victoria Steele is a 81 y.o. female with a PMH Notable for PAD, HTN, Metastatic Breast Cancer, prior PE (history of thrombectomy; on Xarelto) with recent diagnosis of CAD on Coronary CTA artery presented with non-STEMI prior to planned cardiac cath.  She presents today for hospital follow-up after two-vessel PCI, at the request of  Rebeca Alert, Durene Cal, MD.  [name] was last seen on October 09, 2021 to discuss results of her echo and Coronary CTA => she had stopped Polite will be checked chest pain which was concerning and at least one of the types of chest pain was very concerning for angina.  I stressed my concerns about the results and recommended cardiac catheterization with PCI.  She initially wanted to put off cardiac catheterization pending the results of the CT scan but at this point was agreeable to proceed.  She was on amlodipine 10 mg daily, Imdur 30 mg and Toprol 25 mg. => Scheduled for cardiac apposition 10/16/2021 unfortunately she did not make it at home on before this procedure.  She still ended up having cardiac catheterization on the schedule day, but she ended up being to the hospital for stabilization after presenting with non-STEMI.  Recent Hospitalizations:  Admitted to [facility] 7/4-10/2021: Non-STEMI.  Developed severe precordial chest pain 8 out of 10 pain at rest.  (Initially, this occurred after stopping her Eliquis 2 days in preparation for cardiac catheterization on 10/16/2021) => Eliquis held for planned cath.  Restarted post cath along with aspirin Plavix (plan for 1 month triple therapy followed by additional 11 months of uninterrupted Plavix and Eliquis dual therapy, before discontinuing Plavix..  Reviewed  CV studies:    The following studies were reviewed today: (if available, images/films reviewed: From Epic Chart or Care Everywhere) Echo 10/01/2021: Normal LV size and function.  EF 60 to 65%.  No RWMA.  GR 1 DD.  Normal RV size and function.  Normal MV and AOV.  10/16/2021: LEFT HEART CATH AND CORONARY ANGIOGRAPHY-CORONARY STENT INTERVENTION Severe 3-vessel disease: CTO proximal to mid RCA with bridging collaterals and left-to-right collaterals. Proximal LAD segmental 75% stenosis involving ostium of D1 and D2.  Otherwise diffuse moderate 50% mid LAD.  LAD perfuses the PDA via  collaterals. Successful DES PCI with Onyx Frontier DES 3.0 mm x 15 mm -> deployed to 3.3 mm Mid LCx 75% stenosis, just proximal to OM 2 Successful DES PCI with Onyx Frontier DES 2.5 mm 18 mm -> tapered from 2.8 to 2.6 mm Known normal EF with normal EDP     PLAN OF CARE:  Monitor overnight post PCI, anticipate discharge in the morning. Was loaded with Brilinta in the Cath Lab, will give Brilinta tonight and then reload Plavix tomorrow.   Restart DOAC tomorrow morning -> without bridging. Otherwise continue home meds.  Likely need to titrate antihypertensives & further address Lipid management She still has notable Back & Shoulder as well as chest wall pain that are likely MSK pain     Interval History:   Victoria Steele presents here today for posthospital follow-up overall feeling "good ".  She says that she has not had any further of those angina type symptoms.  She still has some of that musculoskeletal chest wall pain up in the left upper chest and  right upper chest that is probably related to her bony mets.  About the only unusual side effect she is noticing his hair loss.  Otherwise doing quite well.  She is not having any bleeding or bruising issues.  No melena, hematochezia, hematuria or epistaxis.  Does note that her energy level is improved.  She still has some baseline dyspnea and neck to get worse with exertion.  She is having 1 occasion she took a couple nitroglycerin for some that made her feel unusual.  Did not instantly help.  No PND, orthopnea or edema.  No irregular heartbeats palpitations.  No syncope or near syncope, no TIA or amaurosis fugax.  => LPa checked in the hospital was 137.  This is elevated.  We will discuss with our research colleagues, it may be best that we actually reduce statin and consider PCSK9 inhibitor.  REVIEWED OF SYSTEMS   Review of Systems  Constitutional:  Positive for malaise/fatigue (Fatigue is better. ;  More energy post PCI). Negative for  weight loss.  HENT:  Positive for hearing loss. Negative for nosebleeds.        Hair loss  Respiratory:  Positive for cough. Negative for sputum production and shortness of breath (Notable improvement).   Cardiovascular:  Negative for chest pain, claudication and PND.  Gastrointestinal:  Negative for blood in stool and melena.  Genitourinary:  Negative for dysuria, frequency and hematuria.  Musculoskeletal:  Positive for back pain, joint pain and myalgias. Negative for falls.  Neurological:  Positive for dizziness. Negative for focal weakness and weakness.  Psychiatric/Behavioral:  Negative for depression and memory loss. The patient is nervous/anxious. The patient does not have insomnia.     A little bit of exertional dyspnea but nothing like she was having.  Nausea only with cancer treatments.  Joint pain, dizziness and headaches.  Not really anxious now.  I have reviewed and (if needed) personally updated the patient's problem list, medications, allergies, past medical and surgical history, social and family history.   PAST MEDICAL HISTORY   Past Medical History:  Diagnosis Date   Coronary artery disease involving native coronary artery 08/30/2021   a. 08/2021 Cor CTA: Cor Ca2+ = 788.  Sev mRCA dzs, Mod LAD and LCx dzs; b. 10/2021 PCI: LM nl, LAD 75/pm (3.0x15 Onyx Frontier DES), 42m LCX 75p (2.5x18 Onyx Frontier DES), RCA 100p/m w/ R->R and L->R collats to distal vessel/RPDA.   Diastolic dysfunction    a. 09/2021 Echo: EF 60-65%, no rwma, GrI DD, nl RV fxn.   DVT (deep venous thrombosis) ([place]    2019   Hypertension    [place] Breast cancer [place]    Port catheter in place 09/14/2016   Placed 09/10/2016 RT chest.    PAST SURGICAL HISTORY   Past Surgical History:  Procedure Laterality Date   ABDOMINAL HYSTERECTOMY     APPENDECTOMY     BREAST BIOPSY     CORONARY STENT INTERVENTION N/A 10/16/2021   Procedure: CORONARY STENT INTERVENTION;  Surgeon: HLeonie Man MD;  Location:  [facility] INVASIVE CV LAB: Prox LAD 75%->0% Onyx Frontier DES 3 x 15 (3.3); Mid LCx 75% (prox to OM2) -> Onyx Frontier DES 2.5 x 18 -> tapered 2.8 to 2.6 mm   CT CTA CORONARY W/CA SCORE W/CM &/OR WO/CM  09/01/2021   Coronary Calcium Score 788.  Combination of calcified and noncalcified plaque-severe lesion in mid RCA (estimated greater than 70%).  Moderate LAD and LCx 50 to 69%). => CAD RADS 4  with severe stenosis suggested in the RCA.  FFRCT not submitted due to motion artifact.-Recommended cardiac catheterization.   CYST EXCISION Right 09/2016   Buttocks   IVC FILTER REMOVAL N/A 01/17/2018   Procedure: IVC FILTER REMOVAL;  Surgeon: Algernon Huxley, MD;  Location: [place] CV LAB;  Service: Cardiovascular;  Laterality: N/A;   LEFT HEART CATH AND CORONARY ANGIOGRAPHY N/A 10/16/2021   Procedure: LEFT HEART CATH AND CORONARY ANGIOGRAPHY;  Surgeon: Leonie Man, MD;  Location: [facility] INVASIVE CV LAB:: mRCA 100% CTO (L-R  & bridging collaterals); prox LAD 75%( DES PCI) - dLAD-rDPDA colleterals; mLCx 75% just proximal to the OM 2 (DES PCI).  Normal EDP.   LOWER EXTREMITY ANGIOGRAPHY Right 11/11/2017   Procedure: LOWER EXTREMITY ANGIOGRAPHY;  Surgeon: Katha Cabal, MD;  Location: [place] CV LAB;  Service: Cardiovascular;  Laterality: Right;   PERIPHERAL VASCULAR THROMBECTOMY Right 08/30/2017   Procedure: PERIPHERAL VASCULAR THROMBECTOMY;  Surgeon: Algernon Huxley, MD;  Location: [place] CV LAB;  Service: Cardiovascular;  Laterality: Right;   PORTACATH PLACEMENT Right 09/10/2016   Procedure: INSERTION PORT-A-CATH;  Surgeon: Jules Husbands, MD;  Location: [facility] ORS;  Service: General;  Laterality: Right;    Immunization History  Administered Date(s) Administered   Influenza, High Dose Seasonal PF 04/04/2018   Influenza-Unspecified 04/03/2019, 02/27/2020   PFIZER(Purple Top)SARS-COV-2 Vaccination 06/16/2019, 07/06/2019, 02/12/2020   Pneumococcal Polysaccharide-23 04/03/2019   Tdap  04/03/2019    MEDICATIONS/ALLERGIES   Current Meds  Medication Sig   acetaminophen (TYLENOL) 500 MG tablet Take 500 mg by mouth daily as needed for moderate pain or headache.    apixaban (ELIQUIS) 5 MG TABS tablet Take 1 tablet (5 mg total) by mouth 2 (two) times daily.   aspirin 81 MG chewable tablet Chew 1 tablet (81 mg total) by mouth daily. STOP THIS MEDICINE AFTER 11/17/2021   atorvastatin (LIPITOR) 40 MG tablet Take 40 mg by mouth at bedtime.   Calcium Carb-Cholecalciferol (CALCIUM 600/VITAMIN D3 PO) Take 1 tablet by mouth daily.   isosorbide mononitrate (IMDUR) 30 MG 24 hr tablet Take 1 tablet (30 mg total) by mouth daily.   letrozole (FEMARA) 2.5 MG tablet Take 1 tablet (2.5 mg total) by mouth daily.   lidocaine-prilocaine (EMLA) cream Apply 1 application. topically as needed (port access).   losartan (COZAAR) 50 MG tablet Take 1 tablet by mouth once a day  for high blood pressure- to replace 100 mg   metoprolol succinate (TOPROL XL) 25 MG 24 hr tablet Take 1 tablet (25 mg total) by mouth daily.   nitroGLYCERIN (NITROSTAT) 0.4 MG SL tablet Place 1 tablet (0.4 mg total) under the tongue every 5 (five) minutes x 3 doses as needed for chest pain.   palbociclib (IBRANCE) 75 MG tablet Take 1 tablet (75 mg total) by mouth daily. Take for 21 days on, 7 days off, repeat every 28 days.   [DISCONTINUED] clopidogrel (PLAVIX) 75 MG tablet Take 1 tablet (75 mg total) by mouth daily. WILL NEED TO TAKE THIS MEDICINE FOR ONE YEAR (UNTIL 10/18/2022) PLEASE ARRANGE REFILLS WITH CARDIOLOGY / PRIMARY CARE   Current Facility-Administered Medications for the 11/13/21 encounter (Office Visit) with Leonie Man, MD  Medication   sodium chloride flush (NS) 0.9 % injection 3 mL    Allergies  Allergen Reactions   Codeine Rash    hallucinations    SOCIAL HISTORY/FAMILY HISTORY   Reviewed in Epic:  Pertinent findings:  Social History   Tobacco Use   Smoking  status: Never   Smokeless tobacco:  Never  Vaping Use   Vaping Use: Never used  Substance Use Topics   Alcohol use: No   Drug use: No   Social History   Social History Narrative   Not on file    OBJCTIVE -PE, EKG, labs   Wt Readings from Last 3 Encounters:  11/13/21 195 lb 9.6 oz (88.7 kg)  10/29/21 196 lb 6 oz (89.1 kg)  10/15/21 201 lb 1 oz (91.2 kg)    Physical Exam: BP (!) 162/72 (BP Location: Right Arm, Patient Position: Sitting, Cuff Size: Large)   Pulse 61   Ht '5\' 2"'$  (1.575 m)   Wt 195 lb 9.6 oz (88.7 kg)   SpO2 98%   BMI 35.78 kg/m  Physical Exam Vitals reviewed.  Constitutional:      General: She is not in acute distress.    Appearance: Normal appearance. She is obese. She is not ill-appearing (She actually looks happy and smiling.) or toxic-appearing.  HENT:     Head: Normocephalic and atraumatic.  Eyes:     Extraocular Movements: Extraocular movements intact.     Pupils: Pupils are equal, round, and reactive to light.  Neck:     Vascular: No carotid bruit.  Cardiovascular:     Rate and Rhythm: Normal rate and regular rhythm. Occasional Extrasystoles are present.    Chest Wall: PMI is not displaced.     Pulses: Normal pulses.     Heart sounds: S1 normal and S2 normal. Heart sounds not distant. No murmur heard.    No friction rub. No gallop.  Pulmonary:     Effort: Pulmonary effort is normal. No respiratory distress.     Breath sounds: Normal breath sounds. No wheezing, rhonchi or rales.  Chest:     Chest wall: Tenderness (Mild tenderness on the costal-sternal border.) present.  Abdominal:     General: There is no distension.     Palpations: There is no mass.     Tenderness: There is no abdominal tenderness.  Musculoskeletal:        General: Swelling (Trivial puffy swelling in) present.     Cervical back: Normal range of motion and neck supple.  Skin:    General: Skin is warm and dry.  Neurological:     General: No focal deficit present.     Mental Status: She is alert and  oriented to person, place, and time.     Motor: No weakness.     Gait: Gait abnormal.  Psychiatric:        Mood and Affect: Mood normal.        Thought Content: Thought content normal.        Judgment: Judgment normal.     Adult ECG Report Not checked  Recent Labs: Reviewed. No results found for: "CHOL", "HDL", "LDLCALC", "LDLDIRECT", "TRIG", "CHOLHDL" LPa - 157.2 Lab Results  Component Value Date   CREATININE 1.15 (H) 10/17/2021   BUN 11 10/17/2021   NA 138 10/17/2021   K 3.3 (L) 10/17/2021   CL 109 10/17/2021   CO2 23 10/17/2021      Latest Ref Rng & Units 10/17/2021    1:22 AM 10/16/2021    7:47 AM 10/14/2021   10:47 PM  CBC  WBC 4.0 - 10.5 K/uL 2.8  2.8  3.3   Hemoglobin 12.0 - 15.0 g/dL 11.0  11.0  12.1   Hematocrit 36.0 - 46.0 % 31.8  32.1  35.3   Platelets 150 -  400 K/uL 227  235  209     Lab Results  Component Value Date   HGBA1C 5.3 09/02/2017   Lab Results  Component Value Date   TSH 3.485 09/02/2017    ================================================== I spent a total of 22 minutes with the patient spent in direct patient consultation.  Additional time spent with chart review  / charting (studies, outside notes, etc): 34 min (Films reviewed and history updated.) Total Time: 56 min  Current medicines are reviewed at length with the patient today.  (+/- concerns) asking for refill on Plavix.  Notice: This dictation was prepared with Dragon dictation along with smart phrase technology. Any transcriptional errors that result from this process are unintentional and may not be corrected upon review.  Studies Ordered:  No orders of the defined types were placed in this encounter.  Meds ordered this encounter  Medications   clopidogrel (PLAVIX) 75 MG tablet    Sig: Take 1 tablet (75 mg total) by mouth daily.    Dispense:  90 tablet    Refill:  3    Patient Instructions / Medication Changes & Studies & Tests Ordered   Patient Instructions  Medication  Instructions:  - Your physician recommends that you continue on your current medications as directed. Please refer to the Current Medication list given to you today.  *If you need a refill on your cardiac medications before your next appointment, please call your pharmacy*   Lab Work: - none ordered  If you have labs (blood work) drawn today and your tests are completely normal, you will receive your results only by: [place] (if you have MyChart) OR A paper copy in the mail If you have any lab test that is abnormal or we need to change your treatment, we will call you to review the results.   Testing/Procedures: - none ordered   Follow-Up: At [facility], you and your health needs are our priority.  As part of our continuing mission to provide you with exceptional heart care, we have created designated Provider Care Teams.  These Care Teams include your primary Cardiologist (physician) and Advanced Practice Providers (APPs -  Physician Assistants and Nurse Practitioners) who all work together to provide you with the care you need, when you need it.  Your next appointment:   5 month(s)  The format for your next appointment:   In Person  Provider:   Glenetta Hew, MD    Other Instructions  Keep a close eye/ blood pressure log for 1-2 weeks before you see your Primary Care Doctor and take these readings with you when you see them  Important Information About Sugar      Leonie Man, MD, MS Glenetta Hew, M.D., M.S. Interventional Cardiologist   [facility]   [address] [place], [place]  [address] [phone]           Fax [phone]    Thank you for choosing [facility] in [place]!!

## 2021-11-14 ENCOUNTER — Encounter: Payer: Self-pay | Admitting: Cardiology

## 2021-11-14 NOTE — Assessment & Plan Note (Signed)
Longstanding issue on chronic Eliquis.. Plan was triple therapy with aspirin Plavix and Eliquis 1 month after PCI and then stop aspirin.  Continue Plavix to complete 1 year before stopping.  Within continue lifelong Eliquis.

## 2021-11-14 NOTE — Assessment & Plan Note (Signed)
Improved post PCI.  Echo looked relatively good.  I do not think she would have had a much bit.  Can probably reassess EF in about 6 months once we have further stabilized her.

## 2021-11-14 NOTE — Assessment & Plan Note (Signed)
Abnormal Coronary CTA with planned cardiac catheterization, presented with non-STEMI: CTO of RCA with left to right collaterals and bridging collaterals.  Mid LAD 75% and LCx 75%. => Both lesions treated with DES PCI  Plan: Continue atorvastatin 40 mg daily but with elevated LPa -may want to consider referral for PCSK9..  Due for lab recheck for lipids.  Continue Imdur and metoprolol for anginal management.  If BP continues to be elevated would probably consider adding amlodipine as opposed diuretic.  If amlodipine is added, we can probably discontinue Imdur and still have room for HCTZ.  On triple therapy with 1 month of DAPT plus Eliquis followed by Plavix alone with Eliquis to complete 1 full year.  Telemetry oh refilled.

## 2021-11-14 NOTE — Assessment & Plan Note (Signed)
Recently admitted for non-STEMI.  Patient missed doses late noted in 2 vessels and potentially occlusion of the RCA which look to be chronic with collaterals.  Status post two-vessel PCI.  No further symptoms. Echocardiogram was done before cath, may be reasonable to reassess in 6 months.

## 2021-11-14 NOTE — Assessment & Plan Note (Signed)
Has been on Eliquis long-term.  He now currently on triple therapy.  We will continue Eliquis predominantly over W-2, but would like to at least 6 months of clopidogrel prior to switching to just Eliquis alone.

## 2021-11-14 NOTE — Assessment & Plan Note (Addendum)
BP is high today.  I will a concern.  She says that if not at home in the 150/75 to 120/80.  I got about the same reading on my recheck.  She seems happy and not anxious.  I have asked that she monitor her pressures.  Next option will be to reinitiate HCTZ versus potentially amlodipine depending on her symptoms.  She actually also has room to titrate up Imdur, but heart rate of 61, no room to on Toprol.

## 2021-11-14 NOTE — Assessment & Plan Note (Signed)
Initial plan was cardiac catheterization for class III symptoms that quickly became class IV ACS symptoms leading to hospitalization with staged PCI.  No further anginal pain.  Has taken nitroglycerin once or twice but not initially with benefit.  Continue Imdur and Toprol along with losartan.  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

## 2021-11-18 ENCOUNTER — Other Ambulatory Visit: Payer: Self-pay | Admitting: Oncology

## 2021-11-18 ENCOUNTER — Other Ambulatory Visit (HOSPITAL_COMMUNITY): Payer: Self-pay

## 2021-11-18 DIAGNOSIS — C50912 Malignant neoplasm of unspecified site of left female breast: Secondary | ICD-10-CM

## 2021-11-18 NOTE — Telephone Encounter (Signed)
Next appointment 10/2 CBC Order: [id number] Status: Final result    Visible to patient: Yes (seen)    Next appt: 01/12/2022 at 01:00 PM in Oncology ([facility])    0 Result Notes           Component Ref Range & Units 1 mo ago (10/17/21) 1 mo ago (10/16/21) 1 mo ago (10/14/21) 1 mo ago (10/10/21) 4 mo ago (07/09/21) 7 mo ago (04/09/21) 10 mo ago (12/24/20)  WBC 4.0 - 10.5 K/uL 2.8 Low   2.8 Low   3.3 Low   2.4 Low   2.6 Low   2.6 Low   2.5 Low    RBC 3.87 - 5.11 MIL/uL 2.96 Low   3.02 Low   3.31 Low   3.14 Low   3.18 Low   3.22 Low   3.13 Low    Hemoglobin 12.0 - 15.0 g/dL 11.0 Low   11.0 Low   12.1  11.9 Low   11.9 Low   12.1  12.0   HCT 36.0 - 46.0 % 31.8 Low   32.1 Low   35.3 Low   33.5 Low   33.9 Low   34.4 Low   34.0 Low    MCV 80.0 - 100.0 fL 107.4 High   106.3 High   106.6 High   106.7 High   106.6 High   106.8 High   108.6 High    MCH 26.0 - 34.0 pg 37.2 High   36.4 High   36.6 High   37.9 High   37.4 High   37.6 High   38.3 High    MCHC 30.0 - 36.0 g/dL 34.6  34.3  34.3  35.5  35.1  35.2  35.3   RDW 11.5 - 15.5 % 15.4  15.4  15.3  15.5  14.9  14.5  14.4   Platelets 150 - 400 K/uL 227  235  209  163  163  224  145 Low    nRBC 0.0 - 0.2 % 0.0  0.0 CM  0.0  0.0  0.0  0.0  0.0   Comment: Performed at [facility], [address] [place], [place] [address]  Neutrophils Relative %    58 R  48 R  54 R  60 R  52 R   Basophils Absolute    0.1 R  0.1 R  0.1 R   0.1 R   Immature Granulocytes    0 R  0 R  0 R  1 R, CM  0 R   Abs Immature Granulocytes    0.01 R, CM  0.01 R, CM  0.01 R, CM   0.01 R, CM   WBC Morphology       DIFF CONFIRMED BY MANUAL  DIFF. CONFIRMED BY SMEAR   RBC Morphology       UNREMRAKABLE  MORPHOLOGY UNREMARKABLE   Smear Review       PLATELETS APPEAR ADEQUATE CM  Normal platelet morphology CM   Neutro Abs    1.9 R  1.2 Low  R  1.4 Low  R  1.5 Low  R  1.3 Low  R   Lymphocytes Relative    30 R  37 R  36 R  28 R  39 R   Lymphs Abs    1.0 R  0.9 R  0.9 R  0.7 R   1.0 R   Monocytes Relative    7 R  11 R  5 R  7 R  5 R   Monocytes Absolute    0.2 R  0.3 R  0.1 R  0.2 R  0.1 R   Eosinophils Relative    3 R  2 R  3 R  3 R  2 R   Eosinophils Absolute    0.1 R  0.0 R  0.1 R  0.1 R  0.0 R   Basophils Relative    2 R  2 R  2 R  2 R  2 R   Resulting Agency  [facility] [place] [facility] [place] [facility] [place] [facility] [place] [facility] [place] [facility] [place] [facility]         Specimen Collected: 10/17/21 01:22 Last Resulted: 10/17/21 01:41      Basic metabolic panel Order: [id number] Status: Final result    Visible to patient: Yes (seen)    Next appt: 01/12/2022 at 01:00 PM in Oncology ([facility])    0 Result Notes           Component Ref Range & Units 1 mo ago (10/17/21) 1 mo ago (10/16/21) 1 mo ago (10/14/21) 1 mo ago (10/10/21) 1 mo ago (10/06/21) 2 mo ago (08/28/21) 4 mo ago (07/09/21)  Sodium 135 - 145 mmol/L 138  137  140  138   140  136   Potassium 3.5 - 5.1 mmol/L 3.3 Low   3.6  3.2 Low   3.5   3.6  3.5   Chloride 98 - 111 mmol/L 109  108  105  103   103  104   CO2 22 - 32 mmol/L '23  23  23  26   22  24   '$ Glucose, Bld 70 - 99 mg/dL 119 High   152 High  CM  202 High  CM  192 High  CM   135 High  CM  157 High  CM   Comment: Glucose reference range applies only to samples taken after fasting for at least 8 hours.  BUN 8 - 23 mg/dL '11  12  13  14   15  15   '$ Creatinine, Ser 0.44 - 1.00 mg/dL 1.15 High   1.20 High   1.10 High   0.99  0.90  1.18 High   1.08 High    Calcium 8.9 - 10.3 mg/dL 8.5 Low   9.1  9.8  9.5   10.6 High   9.5   GFR, Estimated >60 mL/min 49 Low   46 Low  CM  51 Low  CM  58 Low  CM   47 Low  CM  53 Low  CM   Comment: (NOTE)  Calculated using the CKD-EPI Creatinine Equation (2021)   Anion gap 5 - '15 6  6 '$ CM  12 CM  9 CM   15 CM  8 CM   Comment: Performed at [facility], [address] [place], [place] [address]  Resulting Agency  [facility] [place] [facility] [place] [facility] [place] [facility] [place] [facility] [place] [facility] [place] [facility]         Specimen  Collected: 10/17/21 01:22 Last Resulted: 10/17/21 02:03

## 2021-11-20 ENCOUNTER — Other Ambulatory Visit (HOSPITAL_COMMUNITY): Payer: Self-pay

## 2021-11-20 ENCOUNTER — Encounter: Payer: Self-pay | Admitting: Oncology

## 2021-11-20 MED ORDER — PALBOCICLIB 75 MG PO TABS
75.0000 mg | ORAL_TABLET | Freq: Every day | ORAL | 3 refills | Status: DC
  Filled 2021-11-20: qty 21, 28d supply, fill #0
  Filled 2021-12-22: qty 21, 28d supply, fill #1
  Filled 2022-01-19: qty 21, 28d supply, fill #2
  Filled 2022-02-11: qty 21, 28d supply, fill #3

## 2021-11-28 ENCOUNTER — Other Ambulatory Visit (HOSPITAL_COMMUNITY): Payer: Self-pay

## 2021-11-28 ENCOUNTER — Ambulatory Visit: Payer: Medicare Other | Admitting: Nurse Practitioner

## 2021-12-18 ENCOUNTER — Other Ambulatory Visit (HOSPITAL_COMMUNITY): Payer: Self-pay

## 2021-12-22 ENCOUNTER — Other Ambulatory Visit (HOSPITAL_COMMUNITY): Payer: Self-pay

## 2021-12-30 ENCOUNTER — Other Ambulatory Visit (HOSPITAL_COMMUNITY): Payer: Self-pay

## 2022-01-05 ENCOUNTER — Other Ambulatory Visit (HOSPITAL_COMMUNITY): Payer: Self-pay

## 2022-01-12 ENCOUNTER — Inpatient Hospital Stay: Payer: Medicare Other

## 2022-01-12 ENCOUNTER — Encounter: Payer: Self-pay | Admitting: Oncology

## 2022-01-12 ENCOUNTER — Inpatient Hospital Stay: Payer: Medicare Other | Admitting: Oncology

## 2022-01-12 ENCOUNTER — Inpatient Hospital Stay: Payer: Medicare Other | Attending: Oncology

## 2022-01-12 VITALS — BP 183/69 | HR 70

## 2022-01-12 VITALS — BP 172/60 | HR 73 | Temp 98.4°F | Resp 18 | Wt 196.6 lb

## 2022-01-12 DIAGNOSIS — C50912 Malignant neoplasm of unspecified site of left female breast: Secondary | ICD-10-CM | POA: Insufficient documentation

## 2022-01-12 DIAGNOSIS — Z7983 Long term (current) use of bisphosphonates: Secondary | ICD-10-CM

## 2022-01-12 DIAGNOSIS — C7951 Secondary malignant neoplasm of bone: Secondary | ICD-10-CM | POA: Diagnosis present

## 2022-01-12 DIAGNOSIS — Z170 Estrogen receptor positive status [ER+]: Secondary | ICD-10-CM | POA: Diagnosis not present

## 2022-01-12 DIAGNOSIS — D702 Other drug-induced agranulocytosis: Secondary | ICD-10-CM

## 2022-01-12 DIAGNOSIS — Z79811 Long term (current) use of aromatase inhibitors: Secondary | ICD-10-CM

## 2022-01-12 DIAGNOSIS — Z79899 Other long term (current) drug therapy: Secondary | ICD-10-CM

## 2022-01-12 DIAGNOSIS — Z95828 Presence of other vascular implants and grafts: Secondary | ICD-10-CM

## 2022-01-12 DIAGNOSIS — Z5181 Encounter for therapeutic drug level monitoring: Secondary | ICD-10-CM | POA: Diagnosis not present

## 2022-01-12 LAB — CBC WITH DIFFERENTIAL/PLATELET
Abs Immature Granulocytes: 0 10*3/uL (ref 0.00–0.07)
Basophils Absolute: 0.1 10*3/uL (ref 0.0–0.1)
Basophils Relative: 2 %
Eosinophils Absolute: 0.1 10*3/uL (ref 0.0–0.5)
Eosinophils Relative: 2 %
HCT: 34.4 % — ABNORMAL LOW (ref 36.0–46.0)
Hemoglobin: 12.2 g/dL (ref 12.0–15.0)
Immature Granulocytes: 0 %
Lymphocytes Relative: 41 %
Lymphs Abs: 1 10*3/uL (ref 0.7–4.0)
MCH: 37.7 pg — ABNORMAL HIGH (ref 26.0–34.0)
MCHC: 35.5 g/dL (ref 30.0–36.0)
MCV: 106.2 fL — ABNORMAL HIGH (ref 80.0–100.0)
Monocytes Absolute: 0.2 10*3/uL (ref 0.1–1.0)
Monocytes Relative: 7 %
Neutro Abs: 1.2 10*3/uL — ABNORMAL LOW (ref 1.7–7.7)
Neutrophils Relative %: 48 %
Platelets: 293 10*3/uL (ref 150–400)
RBC: 3.24 MIL/uL — ABNORMAL LOW (ref 3.87–5.11)
RDW: 14.6 % (ref 11.5–15.5)
Smear Review: NORMAL
WBC: 2.5 10*3/uL — ABNORMAL LOW (ref 4.0–10.5)
nRBC: 0 % (ref 0.0–0.2)

## 2022-01-12 LAB — COMPREHENSIVE METABOLIC PANEL
ALT: 23 U/L (ref 0–44)
AST: 34 U/L (ref 15–41)
Albumin: 3.7 g/dL (ref 3.5–5.0)
Alkaline Phosphatase: 63 U/L (ref 38–126)
Anion gap: 4 — ABNORMAL LOW (ref 5–15)
BUN: 13 mg/dL (ref 8–23)
CO2: 26 mmol/L (ref 22–32)
Calcium: 9.4 mg/dL (ref 8.9–10.3)
Chloride: 107 mmol/L (ref 98–111)
Creatinine, Ser: 1.12 mg/dL — ABNORMAL HIGH (ref 0.44–1.00)
GFR, Estimated: 50 mL/min — ABNORMAL LOW (ref >60)
Glucose, Bld: 177 mg/dL — ABNORMAL HIGH (ref 70–99)
Potassium: 3.6 mmol/L (ref 3.5–5.1)
Sodium: 137 mmol/L (ref 135–145)
Total Bilirubin: 1 mg/dL (ref 0.3–1.2)
Total Protein: 7.1 g/dL (ref 6.5–8.1)

## 2022-01-12 MED ORDER — SODIUM CHLORIDE 0.9% FLUSH
10.0000 mL | Freq: Once | INTRAVENOUS | Status: DC
  Filled 2022-01-12: qty 10

## 2022-01-12 MED ORDER — HEPARIN SOD (PORK) LOCK FLUSH 100 UNIT/ML IV SOLN
INTRAVENOUS | Status: AC
  Administered 2022-01-12: 500 [IU]
  Filled 2022-01-12: qty 5

## 2022-01-12 MED ORDER — SODIUM CHLORIDE 0.9 % IV SOLN
Freq: Once | INTRAVENOUS | Status: AC
  Filled 2022-01-12: qty 250

## 2022-01-12 MED ORDER — ZOLEDRONIC ACID 4 MG/100ML IV SOLN
4.0000 mg | INTRAVENOUS | Status: DC
  Administered 2022-01-12: 4 mg via INTRAVENOUS
  Filled 2022-01-12: qty 100

## 2022-01-12 NOTE — Progress Notes (Signed)
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  Patient has been on Ibrance and letrozole since then.  She had cytopenias with Leslee Home and is taking 75 mg 3 weeks on 1 week off which was started in September 2018.  Patient is on Zometa for bone metastases which she is getting every 3 months. Patient had extensive RLE DVT in April 2019 and is currently on eliquis. She also underwent venous thrombectomy and thrombolytic therapy and IVC placement by Dr. Lucky Cowboy.  She also has peripheral vascular disease secondary to atherosclerosis and underwent stenting with vascular surgery as well    Interval history- patient recently had an episode of NSTEMI. Seen by cardiology and underwent cardiac stenting. She is currently on plavix  ECOG PS- 1 Pain scale- 0   Review of systems- Review of Systems  Constitutional:  Negative for chills, fever, malaise/fatigue and weight loss.  HENT:  Negative for congestion, ear discharge and nosebleeds.   Eyes:  Negative for blurred vision.  Respiratory:  Negative for cough, hemoptysis, sputum production, shortness of breath and wheezing.   Cardiovascular:  Negative for chest pain, palpitations, orthopnea and claudication.  Gastrointestinal:  Negative for abdominal pain, blood in stool, constipation,  diarrhea, heartburn, melena, nausea and vomiting.  Genitourinary:  Negative for dysuria, flank pain, frequency, hematuria and urgency.  Musculoskeletal:  Negative for back pain, joint pain and myalgias.  Skin:  Negative for rash.  Neurological:  Negative for dizziness, tingling, focal weakness, seizures, weakness and headaches.  Endo/Heme/Allergies:  Does not bruise/bleed easily.  Psychiatric/Behavioral:  Negative for depression and suicidal ideas. The patient does not have insomnia.       Allergies  Allergen Reactions   Codeine Rash    hallucinations     Past Medical History:  Diagnosis Date   Coronary artery disease involving native coronary artery 08/30/2021   a. 08/2021 Cor CTA: Cor Ca2+ = 788.  Sev mRCA dzs, Mod LAD and LCx dzs; b. 10/2021 PCI: LM nl, LAD 75/pm (3.0x15 Onyx Frontier DES), 27m, LCX 75p (2.5x18 Onyx Frontier DES), RCA 100p/m w/ R->R and L->R collats to distal vessel/RPDA.   Diastolic dysfunction    a. 09/2021 Echo: EF 60-65%, no rwma, GrI DD, nl RV fxn.   DVT (deep venous thrombosis) ([place])    2019   Hypertension    Left Breast cancer ([place])    Port catheter in place 09/14/2016   Placed 09/10/2016 RT chest.     Past Surgical History:  Procedure Laterality Date   ABDOMINAL HYSTERECTOMY     APPENDECTOMY     BREAST BIOPSY     CORONARY STENT INTERVENTION N/A 10/16/2021   Procedure: CORONARY STENT INTERVENTION;  Surgeon: Leonie Man, MD;  Location: [facility] INVASIVE CV LAB: Prox LAD 75%->0% Onyx Frontier DES 3 x 15 (3.3); Mid LCx 75% (prox to OM2) -> Onyx Frontier DES 2.5 x  18 -> tapered 2.8 to 2.6 mm   CT CTA CORONARY W/CA SCORE W/CM &/OR WO/CM  09/01/2021   Coronary Calcium Score 788.  Combination of calcified and noncalcified plaque-severe lesion in mid RCA (estimated greater than 70%).  Moderate LAD and LCx 50 to 69%). => CAD RADS 4 with severe stenosis suggested in the RCA.  FFRCT not submitted due to motion artifact.-Recommended cardiac catheterization.   CYST  EXCISION Right 09/2016   Buttocks   IVC FILTER REMOVAL N/A 01/17/2018   Procedure: IVC FILTER REMOVAL;  Surgeon: Algernon Huxley, MD;  Location: [place] CV LAB;  Service: Cardiovascular;  Laterality: N/A;   LEFT HEART CATH AND CORONARY ANGIOGRAPHY N/A 10/16/2021   Procedure: LEFT HEART CATH AND CORONARY ANGIOGRAPHY;  Surgeon: Leonie Man, MD;  Location: [facility] INVASIVE CV LAB:: mRCA 100% CTO (L-R  & bridging collaterals); prox LAD 75%( DES PCI) - dLAD-rDPDA colleterals; mLCx 75% just proximal to the OM 2 (DES PCI).  Normal EDP.   LOWER EXTREMITY ANGIOGRAPHY Right 11/11/2017   Procedure: LOWER EXTREMITY ANGIOGRAPHY;  Surgeon: Katha Cabal, MD;  Location: [place] [facility];  Service: Cardiovascular;  Laterality: Right;   PERIPHERAL VASCULAR THROMBECTOMY Right 08/30/2017   Procedure: PERIPHERAL VASCULAR THROMBECTOMY;  Surgeon: Algernon Huxley, MD;  Location: [place] [facility];  Service: Cardiovascular;  Laterality: Right;   PORTACATH PLACEMENT Right 09/10/2016   Procedure: INSERTION PORT-A-CATH;  Surgeon: Jules Husbands, MD;  Location: [facility] ORS;  Service: General;  Laterality: Right;    Social History   Socioeconomic History   Marital status: Married    Spouse name: Jeneen Rinks   Number of children: 2   Years of education: 6   Highest education level: Bachelor's degree (e.g., BA, AB, BS)  Occupational History   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[employer]  Tobacco Use   Smoking status: Never   Smokeless tobacco: Never  Vaping Use   Vaping Use: Never used  Substance and Sexual Activity   Alcohol use: No   Drug use: No   Sexual activity: Never  Other Topics Concern   Not on file  Social History Narrative   Not on file   Social Determinants of Health   Financial Resource Strain: Low Risk  (04/05/2017)   Overall Financial Resource Strain (CARDIA)    Difficulty of Paying Living Expenses: 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  Food Insecurity: No Food Insecurity  (04/05/2017)   Hunger Vital Sign    Worried About Running Out of Food in the Last Year: Never true    Ran Out of Food in the Last Year: Never true  Transportation Needs: No Transportation Needs (04/05/2017)   PRAPARE - Hydrologist (Medical): No    Lack of Transportation (Non-Medical): No  Physical Activity: Unknown (04/05/2017)   Exercise Vital Sign    Days of Exercise per Week: Patient refused    Minutes of Exercise per Session: Patient refused  Stress: No Stress Concern Present (04/05/2017)   [place]    Feeling of Stress : Not at all  Social Connections: Unknown (04/05/2017)   Social Connection and Isolation Panel [NHANES]    Frequency of Communication with Friends and Family: More than three times a week    Frequency of Social Gatherings with Friends and Family: Once a week    Attends Religious Services: More than 4 times per year    Active Member of  Clubs or Organizations: No    Attends Archivist Meetings: Never    Marital Status: Not on file  Intimate Partner Violence: Not on file    Family History  Problem Relation Age of Onset   Parkinson's disease Mother    Heart disease Father    Bladder Cancer Father    Lung cancer Father    Heart disease Maternal Grandfather      Current Outpatient Medications:    acetaminophen (TYLENOL) 500 MG tablet, Take 500 mg by mouth daily as needed for moderate pain or headache. , Disp: , Rfl:    apixaban (ELIQUIS) 5 MG TABS tablet, Take 1 tablet (5 mg total) by mouth 2 (two) times daily., Disp: 60 tablet, Rfl: 6   atorvastatin (LIPITOR) 40 MG tablet, Take 40 mg by mouth at bedtime., Disp: , Rfl:    Calcium Carb-Cholecalciferol (CALCIUM 600/VITAMIN D3 PO), Take 1 tablet by mouth daily., Disp: , Rfl:    clopidogrel (PLAVIX) 75 MG tablet, Take 1 tablet (75 mg total) by mouth daily., Disp: 90 tablet, Rfl: 3   isosorbide mononitrate  (IMDUR) 30 MG 24 hr tablet, Take 1 tablet (30 mg total) by mouth daily., Disp: 30 tablet, Rfl: 6   letrozole (FEMARA) 2.5 MG tablet, Take 1 tablet (2.5 mg total) by mouth daily., Disp: 90 tablet, Rfl: 5   lidocaine-prilocaine (EMLA) cream, Apply 1 application. topically as needed (port access)., Disp: 30 g, Rfl: 2   losartan (COZAAR) 50 MG tablet, Take 1 tablet by mouth once a day  for high blood pressure- to replace 100 mg, Disp: , Rfl:    metoprolol succinate (TOPROL XL) 25 MG 24 hr tablet, Take 1 tablet (25 mg total) by mouth daily., Disp: 30 tablet, Rfl: 6   palbociclib (IBRANCE) 75 MG tablet, Take 1 tablet (75 mg total) by mouth daily. Take for 21 days on, 7 days off, repeat every 28 days., Disp: 21 tablet, Rfl: 3   atorvastatin (LIPITOR) 20 MG tablet, Take 20 mg by mouth daily. (Patient not taking: Reported on 01/12/2022), Disp: , Rfl:    nitroGLYCERIN (NITROSTAT) 0.4 MG SL tablet, Place 1 tablet (0.4 mg total) under the tongue every 5 (five) minutes x 3 doses as needed for chest pain. (Patient not taking: Reported on 01/12/2022), Disp: 30 tablet, Rfl: 0  Current Facility-Administered Medications:    sodium chloride flush (NS) 0.9 % injection 3 mL, 3 mL, Intravenous, Q12H, Leonie Man, MD  Facility-Administered Medications Ordered in Other Visits:    Zoledronic Acid (ZOMETA) IVPB 4 mg, 4 mg, Intravenous, Q90 days, Sindy Guadeloupe, MD, Stopped at 01/12/22 1444  Physical exam:  Vitals:   01/12/22 1307  BP: (!) 172/60  Pulse: 73  Resp: 18  Temp: 98.4 F (36.9 C)  SpO2: 98%  Weight: 196 lb 9.6 oz (89.2 kg)   Physical Exam Constitutional:      General: She is not in acute distress. Cardiovascular:     Rate and Rhythm: Normal rate and regular rhythm.     Heart sounds: Normal heart sounds.  Pulmonary:     Effort: Pulmonary effort is normal.     Breath sounds: Normal breath sounds.  Abdominal:     General: Bowel sounds are normal.     Palpations: Abdomen is soft.  Skin:     General: Skin is warm and dry.  Neurological:     Mental Status: She is alert and oriented to person, place, and time.  Latest Ref Rng & Units 01/12/2022   12:54 PM  CMP  Glucose 70 - 99 mg/dL 177   BUN 8 - 23 mg/dL 13   Creatinine 0.44 - 1.00 mg/dL 1.12   Sodium 135 - 145 mmol/L 137   Potassium 3.5 - 5.1 mmol/L 3.6   Chloride 98 - 111 mmol/L 107   CO2 22 - 32 mmol/L 26   Calcium 8.9 - 10.3 mg/dL 9.4   Total Protein 6.5 - 8.1 g/dL 7.1   Total Bilirubin 0.3 - 1.2 mg/dL 1.0   Alkaline Phos 38 - 126 U/L 63   AST 15 - 41 U/L 34   ALT 0 - 44 U/L 23       Latest Ref Rng & Units 01/12/2022   12:54 PM  CBC  WBC 4.0 - 10.5 K/uL 2.5   Hemoglobin 12.0 - 15.0 g/dL 12.2   Hematocrit 36.0 - 46.0 % 34.4   Platelets 150 - 400 K/uL 293     No images are attached to the encounter.  No results found.   Assessment and plan- Patient is a 81 y.o. female with metastatic breast cancer and bone metastases here for routine follow-up  Patient is currently on letrozole plus Ibrance which she is tolerating well without any significant side effects.  She has chronic leukopenia/neutropenia secondary to [facility] which she will continue to monitor.  Patient is on Zometa every 3 months for her bone metastases which have remained stable.  She will receive her next dose of Zometa today.  Patient was recently diagnosed with NSTEMI and had underwent cardiac catheterization and currently on Plavix.  She is also on Eliquis for history of DVT.  This potentially increases her risk of bleeding.  Hopefully she will not have any major bleeding episodes while she has to remain on Plavix until July 2024.  I will see her back in 3 months with labs and scans prior   Visit Diagnosis 1. Malignant neoplasm metastatic to bone ([place])   2. High risk medication use   3. Use of letrozole ([place])   4. Encounter for monitoring zoledronic acid therapy   5. Drug-induced neutropenia ([place])      Dr. Randa Evens, MD,  [facility] [phone] 01/12/2022 4:24 PM

## 2022-01-12 NOTE — Progress Notes (Signed)
Creatinine 1.12, B/P 183/69, per Dr. Janese Banks okay to proceed with Zometa, pt to contact cardiology about elevated B/P. Pt aware and states she has an appt with Cardiologist.

## 2022-01-13 LAB — CANCER ANTIGEN 27.29: CA 27.29: 80 U/mL — ABNORMAL HIGH (ref 0.0–38.6)

## 2022-01-19 ENCOUNTER — Other Ambulatory Visit (HOSPITAL_COMMUNITY): Payer: Self-pay

## 2022-01-26 ENCOUNTER — Other Ambulatory Visit (HOSPITAL_COMMUNITY): Payer: Self-pay

## 2022-02-09 ENCOUNTER — Encounter (INDEPENDENT_AMBULATORY_CARE_PROVIDER_SITE_OTHER): Payer: Self-pay

## 2022-02-11 ENCOUNTER — Other Ambulatory Visit (HOSPITAL_COMMUNITY): Payer: Self-pay

## 2022-02-17 ENCOUNTER — Other Ambulatory Visit: Payer: Self-pay | Admitting: Oncology

## 2022-02-18 ENCOUNTER — Encounter: Payer: Self-pay | Admitting: Oncology

## 2022-02-18 ENCOUNTER — Telehealth: Payer: Self-pay | Admitting: *Deleted

## 2022-02-18 ENCOUNTER — Other Ambulatory Visit: Payer: Self-pay | Admitting: *Deleted

## 2022-02-18 MED ORDER — APIXABAN 5 MG PO TABS: 5.0000 mg | ORAL_TABLET | Freq: Two times a day (BID) | ORAL | 6 refills | Status: DC

## 2022-02-18 NOTE — Telephone Encounter (Signed)
Pt called and needed another refill of blood thinner and I refilled it while she was on phone.

## 2022-02-23 ENCOUNTER — Other Ambulatory Visit (HOSPITAL_COMMUNITY): Payer: Self-pay

## 2022-03-12 ENCOUNTER — Other Ambulatory Visit (HOSPITAL_COMMUNITY): Payer: Self-pay

## 2022-03-16 ENCOUNTER — Other Ambulatory Visit: Payer: Self-pay | Admitting: Medical Oncology

## 2022-03-16 ENCOUNTER — Other Ambulatory Visit (HOSPITAL_COMMUNITY): Payer: Self-pay

## 2022-03-16 DIAGNOSIS — C50912 Malignant neoplasm of unspecified site of left female breast: Secondary | ICD-10-CM

## 2022-03-16 MED ORDER — PALBOCICLIB 75 MG PO TABS
75.0000 mg | ORAL_TABLET | Freq: Every day | ORAL | 3 refills | Status: DC
  Filled 2022-03-16 – 2022-03-23 (×4): qty 21, 28d supply, fill #0
  Filled 2022-04-14: qty 21, 28d supply, fill #1
  Filled 2022-05-12: qty 21, 28d supply, fill #2
  Filled 2022-06-09: qty 21, 28d supply, fill #3

## 2022-03-17 ENCOUNTER — Other Ambulatory Visit (HOSPITAL_COMMUNITY): Payer: Self-pay

## 2022-03-20 ENCOUNTER — Other Ambulatory Visit (HOSPITAL_COMMUNITY): Payer: Self-pay

## 2022-03-20 ENCOUNTER — Other Ambulatory Visit: Payer: Self-pay

## 2022-03-23 ENCOUNTER — Other Ambulatory Visit (HOSPITAL_COMMUNITY): Payer: Self-pay

## 2022-03-23 ENCOUNTER — Other Ambulatory Visit: Payer: Self-pay

## 2022-03-30 ENCOUNTER — Ambulatory Visit
Admission: RE | Admit: 2022-03-30 | Discharge: 2022-03-30 | Disposition: A | Discharge status: Home / Self Care | Payer: Medicare Other | Source: Ambulatory Visit | Attending: Oncology | Admitting: Oncology

## 2022-03-30 DIAGNOSIS — C7951 Secondary malignant neoplasm of bone: Secondary | ICD-10-CM | POA: Insufficient documentation

## 2022-03-30 LAB — POCT I-STAT CREATININE: Creatinine, Ser: 0.6 mg/dL (ref 0.44–1.00)

## 2022-03-30 MED ORDER — IOHEXOL 300 MG/ML  SOLN
100.0000 mL | Freq: Once | INTRAMUSCULAR | Status: AC | PRN
  Administered 2022-03-30: 100 mL via INTRAVENOUS

## 2022-03-30 MED ORDER — HEPARIN SOD (PORK) LOCK FLUSH 100 UNIT/ML IV SOLN
INTRAVENOUS | Status: AC
  Administered 2022-03-30: 500 [IU]
  Filled 2022-03-30: qty 5

## 2022-04-10 ENCOUNTER — Other Ambulatory Visit: Payer: Self-pay | Admitting: Cardiology

## 2022-04-14 ENCOUNTER — Inpatient Hospital Stay: Payer: Medicare Other | Attending: Oncology

## 2022-04-14 ENCOUNTER — Inpatient Hospital Stay: Payer: Medicare Other | Admitting: Oncology

## 2022-04-14 ENCOUNTER — Other Ambulatory Visit (HOSPITAL_COMMUNITY): Payer: Self-pay

## 2022-04-14 ENCOUNTER — Encounter: Payer: Self-pay | Admitting: Oncology

## 2022-04-14 ENCOUNTER — Inpatient Hospital Stay: Payer: Medicare Other

## 2022-04-14 VITALS — BP 163/58 | HR 68 | Temp 98.1°F | Ht 63.0 in | Wt 196.3 lb

## 2022-04-14 DIAGNOSIS — Z170 Estrogen receptor positive status [ER+]: Secondary | ICD-10-CM | POA: Insufficient documentation

## 2022-04-14 DIAGNOSIS — C50912 Malignant neoplasm of unspecified site of left female breast: Secondary | ICD-10-CM | POA: Insufficient documentation

## 2022-04-14 DIAGNOSIS — Z79811 Long term (current) use of aromatase inhibitors: Secondary | ICD-10-CM | POA: Diagnosis not present

## 2022-04-14 DIAGNOSIS — Z7189 Other specified counseling: Secondary | ICD-10-CM | POA: Diagnosis not present

## 2022-04-14 DIAGNOSIS — Z79899 Other long term (current) drug therapy: Secondary | ICD-10-CM

## 2022-04-14 DIAGNOSIS — C7951 Secondary malignant neoplasm of bone: Secondary | ICD-10-CM | POA: Insufficient documentation

## 2022-04-14 LAB — COMPREHENSIVE METABOLIC PANEL
ALT: 15 U/L (ref 0–44)
AST: 29 U/L (ref 15–41)
Albumin: 3.5 g/dL (ref 3.5–5.0)
Alkaline Phosphatase: 62 U/L (ref 38–126)
Anion gap: 10 (ref 5–15)
BUN: 15 mg/dL (ref 8–23)
CO2: 22 mmol/L (ref 22–32)
Calcium: 9.3 mg/dL (ref 8.9–10.3)
Chloride: 106 mmol/L (ref 98–111)
Creatinine, Ser: 1.17 mg/dL — ABNORMAL HIGH (ref 0.44–1.00)
GFR, Estimated: 47 mL/min — ABNORMAL LOW (ref >60)
Glucose, Bld: 169 mg/dL — ABNORMAL HIGH (ref 70–99)
Potassium: 3.9 mmol/L (ref 3.5–5.1)
Sodium: 138 mmol/L (ref 135–145)
Total Bilirubin: 1 mg/dL (ref 0.3–1.2)
Total Protein: 7.4 g/dL (ref 6.5–8.1)

## 2022-04-14 LAB — CBC WITH DIFFERENTIAL/PLATELET
Abs Immature Granulocytes: 0.02 10*3/uL (ref 0.00–0.07)
Basophils Absolute: 0 10*3/uL (ref 0.0–0.1)
Basophils Relative: 1 %
Eosinophils Absolute: 0.1 10*3/uL (ref 0.0–0.5)
Eosinophils Relative: 2 %
HCT: 32.2 % — ABNORMAL LOW (ref 36.0–46.0)
Hemoglobin: 11.4 g/dL — ABNORMAL LOW (ref 12.0–15.0)
Immature Granulocytes: 1 %
Lymphocytes Relative: 28 %
Lymphs Abs: 1 10*3/uL (ref 0.7–4.0)
MCH: 37.6 pg — ABNORMAL HIGH (ref 26.0–34.0)
MCHC: 35.4 g/dL (ref 30.0–36.0)
MCV: 106.3 fL — ABNORMAL HIGH (ref 80.0–100.0)
Monocytes Absolute: 0.2 10*3/uL (ref 0.1–1.0)
Monocytes Relative: 6 %
Neutro Abs: 2.2 10*3/uL (ref 1.7–7.7)
Neutrophils Relative %: 62 %
Platelets: 198 10*3/uL (ref 150–400)
RBC: 3.03 MIL/uL — ABNORMAL LOW (ref 3.87–5.11)
RDW: 15.3 % (ref 11.5–15.5)
Smear Review: NORMAL
WBC: 3.4 10*3/uL — ABNORMAL LOW (ref 4.0–10.5)
nRBC: 0 % (ref 0.0–0.2)

## 2022-04-14 MED ORDER — HEPARIN SOD (PORK) LOCK FLUSH 100 UNIT/ML IV SOLN
INTRAVENOUS | Status: AC
  Filled 2022-04-14: qty 5

## 2022-04-14 NOTE — Progress Notes (Signed)
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  Patient has been on Ibrance and letrozole since then.  She had cytopenias with Leslee Home and is taking 75 mg 3 weeks on 1 week off which was started in September 2018.  Patient is on Zometa for bone metastases which she is getting every 3 months. Patient had extensive RLE DVT in April 2019 and is currently on eliquis. She also underwent venous thrombectomy and thrombolytic therapy and IVC placement by Dr. Lucky Cowboy.  She also has peripheral vascular disease secondary to atherosclerosis and underwent stenting with vascular surgery as well   Interval history-tolerating Ibrance and letrozole well.  She was also seen by ENT for possible cerumen.  She tells me that there was some concern for ossicles involving the inner ear.  She also recently recovered from a bout of URI about a week ago.  ECOG PS- 1 Pain scale- 0   Review of systems- Review of Systems  Constitutional:  Negative for chills, fever, malaise/fatigue and weight loss.  HENT:  Negative for congestion, ear discharge and nosebleeds.   Eyes:  Negative for blurred vision.  Respiratory:  Negative for cough, hemoptysis, sputum production, shortness of breath and wheezing.   Cardiovascular:  Negative for chest pain, palpitations, orthopnea  and claudication.  Gastrointestinal:  Negative for abdominal pain, blood in stool, constipation, diarrhea, heartburn, melena, nausea and vomiting.  Genitourinary:  Negative for dysuria, flank pain, frequency, hematuria and urgency.  Musculoskeletal:  Negative for back pain, joint pain and myalgias.  Skin:  Negative for rash.  Neurological:  Negative for dizziness, tingling, focal weakness, seizures, weakness and headaches.  Endo/Heme/Allergies:  Does not bruise/bleed easily.  Psychiatric/Behavioral:  Negative for depression and suicidal ideas. The patient does not have insomnia.       Allergies  Allergen Reactions   Codeine Rash    hallucinations     Past Medical History:  Diagnosis Date   Coronary artery disease involving native coronary artery 08/30/2021   a. 08/2021 Cor CTA: Cor Ca2+ = 788.  Sev mRCA dzs, Mod LAD and LCx dzs; b. 10/2021 PCI: LM nl, LAD 75/pm (3.0x15 Onyx Frontier DES), 30m LCX 75p (2.5x18 Onyx Frontier DES), RCA 100p/m w/ R->R and L->R collats to distal vessel/RPDA.   Diastolic dysfunction    a. 09/2021 Echo: EF 60-65%, no rwma, GrI DD, nl RV fxn.   [place]    [place]   Hypertension    [place]    Port catheter in place 09/14/2016   Placed 09/10/2016 RT chest.     Past Surgical History:  Procedure Laterality Date   ABDOMINAL HYSTERECTOMY     APPENDECTOMY     BREAST BIOPSY     CORONARY STENT INTERVENTION N/A 10/16/2021   Procedure: CORONARY STENT INTERVENTION;  Surgeon: HLeonie Man MD;  Location: [facility] INVASIVE CV LAB: Prox LAD  75%->0% Onyx Frontier DES 3 x 15 (3.3); Mid LCx 75% (prox to OM2) -> Onyx Frontier DES 2.5 x 18 -> tapered 2.8 to 2.6 mm   CT CTA CORONARY W/CA SCORE W/CM &/OR WO/CM  09/01/2021   Coronary Calcium Score 788.  Combination of calcified and noncalcified plaque-severe lesion in mid RCA (estimated greater than 70%).  Moderate LAD and LCx 50 to 69%). => CAD RADS 4 with severe stenosis suggested in  the RCA.  FFRCT not submitted due to motion artifact.-Recommended cardiac catheterization.   CYST EXCISION Right 09/2016   Buttocks   IVC FILTER REMOVAL N/A 01/17/2018   Procedure: IVC FILTER REMOVAL;  Surgeon: Algernon Huxley, MD;  Location: [place] [facility];  Service: Cardiovascular;  Laterality: N/A;   LEFT HEART CATH AND CORONARY ANGIOGRAPHY N/A 10/16/2021   Procedure: LEFT HEART CATH AND CORONARY ANGIOGRAPHY;  Surgeon: Leonie Man, MD;  Location: [facility] INVASIVE CV LAB:: mRCA 100% CTO (L-R  & bridging collaterals); prox LAD 75%( DES PCI) - dLAD-rDPDA colleterals; mLCx 75% just proximal to the OM 2 (DES PCI).  Normal EDP.   LOWER EXTREMITY ANGIOGRAPHY Right 11/11/2017   Procedure: LOWER EXTREMITY ANGIOGRAPHY;  Surgeon: Katha Cabal, MD;  Location: [place] [facility];  Service: Cardiovascular;  Laterality: Right;   PERIPHERAL VASCULAR THROMBECTOMY Right 08/30/2017   Procedure: PERIPHERAL VASCULAR THROMBECTOMY;  Surgeon: Algernon Huxley, MD;  Location: [place] [facility];  Service: Cardiovascular;  Laterality: Right;   PORTACATH PLACEMENT Right 09/10/2016   Procedure: INSERTION PORT-A-CATH;  Surgeon: Jules Husbands, MD;  Location: [facility] OR[facility];  Service: General;  Laterality: Right;    Social History   Socioeconomic History   Marital status: Married    Spouse name: Jeneen Rinks   Number of children: 2   Years of education: 6   Highest education level: Bachelor's degree (e.g., BA, AB, BS)  Occupational History   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[employer]  Tobacco Use   Smoking status: Never   Smokeless tobacco: Never  Vaping Use   Vaping Use: Never used  Substance and Sexual Activity   Alcohol use: No   Drug use: No   Sexual activity: Never  Other Topics Concern   Not on file  Social History Narrative   Not on file   Social Determinants of Health   Financial Resource Strain: Low Risk  (04/05/2017)   Overall Financial Resource Strain (CARDIA)     Difficulty of Paying Living Expenses: 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  Food Insecurity: No Food Insecurity (04/05/2017)   Hunger Vital Sign    Worried About Running Out of Food in the Last Year: Never true    Ran Out of Food in the Last Year: Never true  Transportation Needs: No Transportation Needs (04/05/2017)   PRAPARE - Hydrologist (Medical): No    Lack of Transportation (Non-Medical): No  Physical Activity: Unknown (04/05/2017)   Exercise Vital Sign    Days of Exercise per Week: Patient refused    Minutes of Exercise per Session: Patient refused  Stress: No Stress Concern Present (04/05/2017)   [place]    Feeling of Stress : Not at all  Social Connections: Unknown (04/05/2017)   Social Connection and Isolation Panel [NHANES]    Frequency of Communication with Friends and Family: More than three times a week    Frequency of Social Gatherings with Friends and Family: Once  a week    Attends Religious Services: More than 4 times per year    Active Member of Clubs or Organizations: No    Attends Archivist Meetings: Never    Marital Status: Not on file  Intimate Partner Violence: Not on file    Family History  Problem Relation Age of Onset   Parkinson's disease Mother    Heart disease Father    Bladder Cancer Father    Lung cancer Father    Heart disease Maternal Grandfather      Current Outpatient Medications:    acetaminophen (TYLENOL) 500 MG tablet, Take 500 mg by mouth daily as needed for moderate pain or headache. , Disp: , Rfl:    apixaban (ELIQUIS) 5 MG TABS tablet, Take 1 tablet (5 mg total) by mouth 2 (two) times daily., Disp: 60 tablet, Rfl: 6   atorvastatin (LIPITOR) 40 MG tablet, Take 40 mg by mouth at bedtime., Disp: , Rfl:    Calcium Carb-Cholecalciferol (CALCIUM 600/VITAMIN D3 PO), Take 1 tablet by mouth daily., Disp: , Rfl:    clopidogrel (PLAVIX) 75 MG tablet, Take 1  tablet (75 mg total) by mouth daily., Disp: 90 tablet, Rfl: 3   isosorbide mononitrate (IMDUR) 30 MG 24 hr tablet, Take 1 tablet by mouth once daily, Disp: 90 tablet, Rfl: 0   letrozole (FEMARA) 2.5 MG tablet, Take 1 tablet (2.5 mg total) by mouth daily., Disp: 90 tablet, Rfl: 5   lidocaine-prilocaine (EMLA) cream, Apply 1 application. topically as needed (port access)., Disp: 30 g, Rfl: 2   losartan (COZAAR) 50 MG tablet, Take 1 tablet by mouth once a day  for high blood pressure- to replace 100 mg, Disp: , Rfl:    metoprolol succinate (TOPROL XL) 25 MG 24 hr tablet, Take 1 tablet (25 mg total) by mouth daily., Disp: 30 tablet, Rfl: 6   nitroGLYCERIN (NITROSTAT) 0.4 MG SL tablet, Place 1 tablet (0.4 mg total) under the tongue every 5 (five) minutes x 3 doses as needed for chest pain., Disp: 30 tablet, Rfl: 0   palbociclib (IBRANCE) 75 MG tablet, Take 1 tablet (75 mg total) by mouth daily. Take for 21 days on, 7 days off, repeat every 28 days., Disp: 21 tablet, Rfl: 3   atorvastatin (LIPITOR) 20 MG tablet, Take 20 mg by mouth daily. (Patient not taking: Reported on 01/12/2022), Disp: , Rfl:   Current Facility-Administered Medications:    sodium chloride flush (NS) 0.9 % injection 3 mL, 3 mL, Intravenous, Q12H, Leonie Man, MD  Physical exam:  Vitals:   04/14/22 1357  BP: (!) 163/58  Pulse: 68  Temp: 98.1 F (36.7 C)  TempSrc: Tympanic  Weight: 196 lb 4.8 oz (89 kg)  Height: _0  (1.6 m)   Physical Exam Constitutional:      General: She is not in acute distress. Cardiovascular:     Rate and Rhythm: Normal rate and regular rhythm.     Heart sounds: Normal heart sounds.  Pulmonary:     Effort: Pulmonary effort is normal.     Breath sounds: Normal breath sounds.  Abdominal:     General: Bowel sounds are normal.     Palpations: Abdomen is soft.  Skin:    General: Skin is warm and dry.  Neurological:     Mental Status: She is alert and oriented to person, place, and time.          Latest Ref Rng & Units 04/14/2022    1:41 PM  CMP  Glucose 70 - 99 mg/dL 169   BUN 8 - 23 mg/dL 15   Creatinine 0.44 - 1.00 mg/dL 1.17   Sodium 135 - 145 mmol/L 138   Potassium 3.5 - 5.1 mmol/L 3.9   Chloride 98 - 111 mmol/L 106   CO2 22 - 32 mmol/L 22   Calcium 8.9 - 10.3 mg/dL 9.3   Total Protein 6.5 - 8.1 g/dL 7.4   Total Bilirubin 0.3 - 1.2 mg/dL 1.0   Alkaline Phos 38 - 126 U/L 62   AST 15 - 41 U/L 29   ALT 0 - 44 U/L 15       Latest Ref Rng & Units 01/12/2022   12:54 PM  CBC  WBC 4.0 - 10.5 K/uL 2.5   Hemoglobin 12.0 - 15.0 g/dL 12.2   Hematocrit 36.0 - 46.0 % 34.4   Platelets 150 - 400 K/uL 293     No images are attached to the encounter.  CT CHEST ABDOMEN PELVIS W CONTRAST  Result Date: 03/31/2022 CLINICAL DATA:  82 year old female presents for evaluation of metastatic breast cancer, follow-up assessment. * Tracking Code: BO * EXAM: CT CHEST, ABDOMEN, AND PELVIS WITH CONTRAST TECHNIQUE: Multidetector CT imaging of the chest, abdomen and pelvis was performed following the standard protocol during bolus administration of intravenous contrast. RADIATION DOSE REDUCTION: This exam was performed according to the departmental dose-optimization program which includes automated exposure control, adjustment of the mA and/or kV according to patient size and/or use of iterative reconstruction technique. CONTRAST:  121m OMNIPAQUE IOHEXOL 300 MG/ML  SOLN COMPARISON:  October 06, 2021. FINDINGS: CT CHEST FINDINGS Cardiovascular: RIGHT IJ Port-A-Cath terminates in the low superior vena cava. Three-vessel coronary artery calcifications. Normal heart size without pericardial effusion. Normal caliber of central pulmonary vessels. Normal caliber of the thoracic aorta with aortic atherosclerotic changes. Mediastinum/Nodes: Mildly patulous esophagus. No signs of adenopathy at the thoracic inlet or LEFT or RIGHT axilla. No mediastinal or hilar lymphadenopathy. No internal mammary lymphadenopathy.  Lungs/Pleura: Lungs are clear aside from mild basilar atelectasis. Airways are patent. Musculoskeletal: Nodularity and skin thickening of the LEFT breast with similar appearance to prior imaging. No chest wall mass. See below for full musculoskeletal details. CT ABDOMEN PELVIS FINDINGS Hepatobiliary: No focal, suspicious hepatic lesion. Stable cyst in the anterior LEFT hemiliver. Cholelithiasis without biliary duct dilation or pericholecystic stranding. Portal vein is patent. Pancreas: Normal, without mass, inflammation or ductal dilatation. Spleen: Normal. Adrenals/Urinary Tract: Adrenal glands are normal. Mild cortical scarring of the bilateral kidneys without focal, suspicious renal lesion. No hydronephrosis. No perinephric stranding. No perivesical stranding. Stomach/Bowel: Colonic diverticulosis and diverticular disease of the sigmoid. No acute gastrointestinal findings. Vascular/Lymphatic: Aortic atherosclerosis. No sign of aneurysm. Smooth contour of the IVC. There is no gastrohepatic or hepatoduodenal ligament lymphadenopathy. No retroperitoneal or mesenteric lymphadenopathy. No pelvic sidewall lymphadenopathy. Reproductive: Post hysterectomy. Other: No ascites. Musculoskeletal: Diffuse bony metastatic disease involving all visualized bones without change from previous imaging. IMPRESSION: 1. Diffuse bony metastatic disease involving all visualized bones without change from previous imaging. 2. No new or progressive findings. 3. Three-vessel coronary artery calcifications. 4. Cholelithiasis without evidence of acute cholecystitis. 5. Colonic diverticulosis and diverticular disease of the sigmoid. 6. Aortic atherosclerosis. Aortic Atherosclerosis (ICD10-I70.0). Electronically Signed   By: GZetta BillsM.D.   On: 03/31/2022 09:51     Assessment and plan- Patient is a 82y.o. female with metastatic ER positive HER2 negative breast cancer with bone metastases currently on Ibrance plus letrozole here for  routine follow-up  I have reviewed CT chest abdomen and pelvis images independently and discussed findings with the patient which overall shows stable evidence of bony metastatic disease and no evidence of visceral mets.  Tumor markers have been fluctuating between 70-80 but overall stable.  No evidence of progressive disease.  Plan is to continue letrozole plus Ibrance until progression or toxicity.  She has been on this regimen for over 4 years now.   Bone metastases: Patient has been on Zometa since 2018.  She has poor dentition and we have been giving Zometa every 3 months.  Given that her bone mets have been stable I will consider switching her to yearly Reclast at this time instead of Zometa every 3 months given risk of possible osteonecrosis of the jaw.  I am holding off on doing any Zometa at this time  I will see her back in 3 months with port labs CBC with differential CMP CA 27-29 Visit Diagnosis 1. Malignant neoplasm metastatic to bone ([place])   2. Goals of care, counseling/discussion   3. High risk medication use      Dr. Randa Evens, MD, MPH [facility] [phone] 04/14/2022 2:07 PM

## 2022-04-15 ENCOUNTER — Other Ambulatory Visit (HOSPITAL_COMMUNITY): Payer: Self-pay

## 2022-04-15 ENCOUNTER — Telehealth: Payer: Self-pay

## 2022-04-15 ENCOUNTER — Encounter: Payer: Self-pay | Admitting: Oncology

## 2022-04-15 LAB — CANCER ANTIGEN 27.29: CA 27.29: 81.7 U/mL — ABNORMAL HIGH (ref 0.0–38.6)

## 2022-04-15 NOTE — Telephone Encounter (Signed)
Oral Oncology Patient Advocate Encounter  Was successful in securing patient a $15,000 grant from [employer] to provide copayment coverage for [place].  This will keep the out of pocket expense at $0.     [employer] ID: [id number]  I have spoken with the patient.   The billing information is as follows and has been shared with [place].    RxBin: [id number] PCN: [id number] Member ID: [id number] Group ID: [id number] Dates of Eligibility: 03/25/22 through 03/25/23  Fund:  [place], [facility] Patient [place]  [phone] (phone) [phone] (fax) 04/15/2022 11:54 AM

## 2022-04-16 ENCOUNTER — Other Ambulatory Visit (HOSPITAL_COMMUNITY): Payer: Self-pay

## 2022-04-17 ENCOUNTER — Other Ambulatory Visit: Payer: Self-pay

## 2022-04-19 ENCOUNTER — Other Ambulatory Visit: Payer: Self-pay | Admitting: Cardiology

## 2022-05-12 ENCOUNTER — Other Ambulatory Visit (HOSPITAL_COMMUNITY): Payer: Self-pay

## 2022-05-15 ENCOUNTER — Other Ambulatory Visit (HOSPITAL_COMMUNITY): Payer: Self-pay

## 2022-06-09 ENCOUNTER — Other Ambulatory Visit (HOSPITAL_COMMUNITY): Payer: Self-pay

## 2022-06-11 ENCOUNTER — Other Ambulatory Visit (HOSPITAL_COMMUNITY): Payer: Self-pay

## 2022-06-30 ENCOUNTER — Encounter (INDEPENDENT_AMBULATORY_CARE_PROVIDER_SITE_OTHER): Payer: Self-pay | Admitting: Vascular Surgery

## 2022-06-30 ENCOUNTER — Ambulatory Visit (INDEPENDENT_AMBULATORY_CARE_PROVIDER_SITE_OTHER): Payer: Medicare Other

## 2022-06-30 ENCOUNTER — Other Ambulatory Visit (INDEPENDENT_AMBULATORY_CARE_PROVIDER_SITE_OTHER): Payer: Self-pay | Admitting: Vascular Surgery

## 2022-06-30 ENCOUNTER — Ambulatory Visit (INDEPENDENT_AMBULATORY_CARE_PROVIDER_SITE_OTHER): Payer: Medicare Other | Admitting: Vascular Surgery

## 2022-06-30 VITALS — BP 136/73 | HR 69 | Resp 18 | Ht 62.0 in | Wt 195.8 lb

## 2022-06-30 DIAGNOSIS — I10 Essential (primary) hypertension: Secondary | ICD-10-CM

## 2022-06-30 DIAGNOSIS — C50112 Malignant neoplasm of central portion of left female breast: Secondary | ICD-10-CM | POA: Diagnosis not present

## 2022-06-30 DIAGNOSIS — I739 Peripheral vascular disease, unspecified: Secondary | ICD-10-CM

## 2022-06-30 DIAGNOSIS — I82511 Chronic embolism and thrombosis of right femoral vein: Secondary | ICD-10-CM | POA: Diagnosis not present

## 2022-06-30 DIAGNOSIS — Z170 Estrogen receptor positive status [ER+]: Secondary | ICD-10-CM

## 2022-06-30 NOTE — Progress Notes (Signed)
MRN : [id number]  Victoria Steele is a 82 y.o. (1940-08-17) female who presents with chief complaint of  Chief Complaint  Patient presents with   Follow-up    Follow up 1 year with abi   .  History of Present Illness: Patient returns today in follow up of her PAD.  She underwent right lower extremity revascularization several years ago.  She is currently doing well.  She has some mild claudication symptoms but nothing that is lifestyle limiting.  She does not have rest pain or ulceration.  Her ABIs today are stable at 0.92 on the right 0.82 on the left with multiphasic waveforms and digital pressures of 102 on the right and 90 on the left.  Current Outpatient Medications  Medication Sig Dispense Refill   acetaminophen (TYLENOL) 500 MG tablet Take 500 mg by mouth daily as needed for moderate pain or headache.      amLODipine (NORVASC) 10 MG tablet Take 10 mg by mouth daily.     apixaban (ELIQUIS) 5 MG TABS tablet Take 1 tablet (5 mg total) by mouth 2 (two) times daily. 60 tablet 6   atorvastatin (LIPITOR) 40 MG tablet Take 40 mg by mouth at bedtime.     Calcium Carb-Cholecalciferol (CALCIUM 600/VITAMIN D3 PO) Take 1 tablet by mouth daily.     clopidogrel (PLAVIX) 75 MG tablet Take 1 tablet (75 mg total) by mouth daily. 90 tablet 3   isosorbide mononitrate (IMDUR) 30 MG 24 hr tablet Take 1 tablet by mouth once daily 90 tablet 0   letrozole (FEMARA) 2.5 MG tablet Take 1 tablet (2.5 mg total) by mouth daily. 90 tablet 5   lidocaine-prilocaine (EMLA) cream Apply 1 application. topically as needed (port access). 30 g 2   losartan (COZAAR) 50 MG tablet Take 1 tablet by mouth once a day  for high blood pressure- to replace 100 mg     metoprolol succinate (TOPROL-XL) 25 MG 24 hr tablet Take 1 tablet by mouth once daily 90 tablet 2   nitroGLYCERIN (NITROSTAT) 0.4 MG SL tablet Place 1 tablet (0.4 mg total) under the tongue every 5 (five) minutes x 3 doses as needed for chest pain. 30 tablet 0    palbociclib (IBRANCE) 75 MG tablet Take 1 tablet (75 mg total) by mouth daily. Take for 21 days on, 7 days off, repeat every 28 days. 21 tablet 3   atorvastatin (LIPITOR) 20 MG tablet Take 20 mg by mouth daily. (Patient not taking: Reported on 06/30/2022)     Current Facility-Administered Medications  Medication Dose Route Frequency Provider Last Rate Last Admin   sodium chloride flush (NS) 0.9 % injection 3 mL  3 mL Intravenous Q12H Leonie Man, MD        Past Medical History:  Diagnosis Date   Coronary artery disease involving native coronary artery 08/30/2021   a. 08/2021 Cor CTA: Cor Ca2+ = 788.  Sev mRCA dzs, Mod LAD and LCx dzs; b. 10/2021 PCI: LM nl, LAD 75/pm (3.0x15 Onyx Frontier DES), 48m, LCX 75p (2.5x18 Onyx Frontier DES), RCA 100p/m w/ R->R and L->R collats to distal vessel/RPDA.   Diastolic dysfunction    a. 09/2021 Echo: EF 60-65%, no rwma, GrI DD, nl RV fxn.   DVT (deep venous thrombosis) ([place])    2019   Hypertension    Left Breast cancer ([place])    Port catheter in place 09/14/2016   Placed 09/10/2016 RT chest.    Past Surgical History:  Procedure  Laterality Date   ABDOMINAL HYSTERECTOMY     APPENDECTOMY     BREAST BIOPSY     CORONARY STENT INTERVENTION N/A 10/16/2021   Procedure: CORONARY STENT INTERVENTION;  Surgeon: Leonie Man, MD;  Location: [facility] INVASIVE CV LAB: Prox LAD 75%->0% Onyx Frontier DES 3 x 15 (3.3); Mid LCx 75% (prox to OM2) -> Onyx Frontier DES 2.5 x 18 -> tapered 2.8 to 2.6 mm   CT CTA CORONARY W/CA SCORE W/CM &/OR WO/CM  09/01/2021   Coronary Calcium Score 788.  Combination of calcified and noncalcified plaque-severe lesion in mid RCA (estimated greater than 70%).  Moderate LAD and LCx 50 to 69%). => CAD RADS 4 with severe stenosis suggested in the RCA.  FFRCT not submitted due to motion artifact.-Recommended cardiac catheterization.   CYST EXCISION Right 09/2016   Buttocks   IVC FILTER REMOVAL N/A 01/17/2018   Procedure: IVC FILTER REMOVAL;   Surgeon: Algernon Huxley, MD;  Location: [facility];  Service: Cardiovascular;  Laterality: N/A;   LEFT HEART CATH AND CORONARY ANGIOGRAPHY N/A 10/16/2021   Procedure: LEFT HEART CATH AND CORONARY ANGIOGRAPHY;  Surgeon: Leonie Man, MD;  Location: [facility] INVASIVE CV LAB:: mRCA 100% CTO (L-R  & bridging collaterals); prox LAD 75%( DES PCI) - dLAD-rDPDA colleterals; mLCx 75% just proximal to the OM 2 (DES PCI).  Normal EDP.   LOWER EXTREMITY ANGIOGRAPHY Right 11/11/2017   Procedure: LOWER EXTREMITY ANGIOGRAPHY;  Surgeon: Katha Cabal, MD;  Location: [place] [place] [facility];  Service: Cardiovascular;  Laterality: Right;   PERIPHERAL VASCULAR THROMBECTOMY Right 08/30/2017   Procedure: PERIPHERAL VASCULAR THROMBECTOMY;  Surgeon: Algernon Huxley, MD;  Location: [place];  Service: Cardiovascular;  Laterality: Right;   PORTACATH PLACEMENT Right 09/10/2016   Procedure: INSERTION PORT-A-CATH;  Surgeon: Jules Husbands, MD;  Location: [facility];  Service: General;  Laterality: Right;     Social History   Tobacco Use   Smoking status: Never   Smokeless tobacco: Never  Vaping Use   Vaping Use: Never used  Substance Use Topics   Alcohol use: No   Drug use: No      Family History  Problem Relation Age of Onset   Parkinson's disease Mother    Heart disease Father    Bladder Cancer Father    Lung cancer Father    Heart disease Maternal Grandfather     Allergies  Allergen Reactions   Codeine Rash    hallucinations    REVIEW OF SYSTEMS (Negative unless checked)   Constitutional: [] Weight loss  [] Fever  [] Chills Cardiac: [] Chest pain   [] Chest pressure   [] Palpitations   [] Shortness of breath when laying flat   [] Shortness of breath at rest   [] Shortness of breath with exertion. Vascular:  [] Pain in legs with walking   [] Pain in legs at rest   [] Pain in legs when laying flat   [] Claudication   [] Pain in feet when walking  [] Pain in feet at rest  [] Pain in feet when  laying flat   [x] History of DVT   [x] Phlebitis   [x] Swelling in legs   [] Varicose veins   [] Non-healing ulcers Pulmonary:   [] Uses home oxygen   [] Productive cough   [] Hemoptysis   [] Wheeze  [] COPD   [] Asthma Neurologic:  [] Dizziness  [] Blackouts   [] Seizures   [] History of stroke   [] History of TIA  [] Aphasia   [] Temporary blindness   [] Dysphagia   [] Weakness or numbness in arms   []   Weakness or numbness in legs Musculoskeletal:  [x] Arthritis   [] Joint swelling   [] Joint pain   [] Low back pain Hematologic:  [] Easy bruising  [] Easy bleeding   [] Hypercoagulable state   [] Anemic  [] Hepatitis Gastrointestinal:  [] Blood in stool   [] Vomiting blood  [] Gastroesophageal reflux/heartburn   [] Abdominal pain Genitourinary:  [] Chronic kidney disease   [] Difficult urination  [] Frequent urination  [] Burning with urination   [] Hematuria Skin:  [] Rashes   [] Ulcers   [] Wounds Psychological:  [] History of anxiety   []  History of major depression.  Physical Examination  BP 136/73 (BP Location: Right Arm)   Pulse 69   Resp 18   Ht 5\' 2"  (1.575 m)   Wt 195 lb 12.8 oz (88.8 kg)   BMI 35.81 kg/m  Gen:  WD/WN, NAD. Appears younger than stated age. Head: [place]/AT, No temporalis wasting. Ear/Nose/Throat: Hearing grossly intact, nares w/o erythema or drainage Eyes: Conjunctiva clear. Sclera non-icteric Neck: Supple.  Trachea midline Pulmonary:  Good air movement, no use of accessory muscles.  Cardiac: irregular Vascular:  Vessel Right Left  Radial Palpable Palpable                          PT 2+ Palpable 1+ Palpable  DP 1+ Palpable 1+ Palpable   Gastrointestinal: soft, non-tender/non-distended. No guarding/reflex.  Musculoskeletal: M/S 5/5 throughout.  No deformity or atrophy. Trace LE edema. Neurologic: Sensation grossly intact in extremities.  Symmetrical.  Speech is fluent.  Psychiatric: Judgment intact, Mood & affect appropriate for pt's clinical situation. Dermatologic: No rashes or ulcers noted.   No cellulitis or open wounds.      Labs Recent Results (from the past 2160 hour(s))  Comprehensive metabolic panel     Status: Abnormal   Collection Time: 04/14/22  1:41 PM  Result Value Ref Range   Sodium 138 135 - 145 mmol/L   Potassium 3.9 3.5 - 5.1 mmol/L   Chloride 106 98 - 111 mmol/L   CO2 22 22 - 32 mmol/L   Glucose, Bld 169 (H) 70 - 99 mg/dL    Comment: Glucose reference range applies only to samples taken after fasting for at least 8 hours.   BUN 15 8 - 23 mg/dL   Creatinine, Ser 1.17 (H) 0.44 - 1.00 mg/dL   Calcium 9.3 8.9 - 10.3 mg/dL   Total Protein 7.4 6.5 - 8.1 g/dL   Albumin 3.5 3.5 - 5.0 g/dL   AST 29 15 - 41 U/L   ALT 15 0 - 44 U/L   Alkaline Phosphatase 62 38 - 126 U/L   Total Bilirubin 1.0 0.3 - 1.2 mg/dL   GFR, Estimated 47 (L) >60 mL/min    Comment: (NOTE) Calculated using the CKD-EPI Creatinine Equation (2021)    Anion gap 10 5 - 15    Comment: Performed at [facility], [address] [place], [place] [address]  Cancer antigen 27.29     Status: Abnormal   Collection Time: 04/14/22  1:41 PM  Result Value Ref Range   CA 27.29 81.7 (H) 0.0 - 38.6 U/mL    Comment: (NOTE) Siemens Centaur Immunochemiluminometric Methodology (ICMA) Values obtained with different assay methods or kits cannot be used interchangeably. Results cannot be interpreted as absolute evidence of the presence or absence of malignant disease. Performed At: [facility] [place], [place] [id number] Rush Farmer MD [id number]   CBC with Differential/Platelet     Status: Abnormal   Collection Time:  04/14/22  1:41 PM  Result Value Ref Range   WBC 3.4 (L) 4.0 - 10.5 K/uL   RBC 3.03 (L) 3.87 - 5.11 MIL/uL   Hemoglobin 11.4 (L) 12.0 - 15.0 g/dL   HCT 32.2 (L) 36.0 - 46.0 %   MCV 106.3 (H) 80.0 - 100.0 fL   MCH 37.6 (H) 26.0 - 34.0 pg   MCHC 35.4 30.0 - 36.0 g/dL   RDW 15.3 11.5 - 15.5 %   Platelets 198 150 - 400 K/uL   nRBC 0.0 0.0 - 0.2 %    Neutrophils Relative % 62 %   Neutro Abs 2.2 1.7 - 7.7 K/uL   Lymphocytes Relative 28 %   Lymphs Abs 1.0 0.7 - 4.0 K/uL   Monocytes Relative 6 %   Monocytes Absolute 0.2 0.1 - 1.0 K/uL   Eosinophils Relative 2 %   Eosinophils Absolute 0.1 0.0 - 0.5 K/uL   Basophils Relative 1 %   Basophils Absolute 0.0 0.0 - 0.1 K/uL   WBC Morphology DIFF CONFIRMED BY MANUAL.    RBC Morphology UNREMARKABLE    Smear Review Normal platelet morphology     Comment: PLATELETS APPEAR ADEQUATE   Immature Granulocytes 1 %   Abs Immature Granulocytes 0.02 0.00 - 0.07 K/uL    Comment: Performed at [facility]: [address], [address] [place], [place] [address]    Radiology No results found.  Assessment/Plan Breast cancer in female [facility]) This increases her clotting risk and is likely a cause of her being hypercoagulable.   Hypertension blood pressure control important in reducing the progression of atherosclerotic disease. On appropriate oral medications.     DVT (deep venous thrombosis) ([place]) Status post thrombectomy about 5 years ago.  No significant pain or swelling post procedure.  PAD (peripheral artery disease) ([facility]) Her ABIs today are stable at 0.92 on the right 0.82 on the left with multiphasic waveforms and digital pressures of 102 on the right and 90 on the left.  Doing well after right lower extremity revascularization several years ago.  Perfusion is intact.  Continue current medical regimen.  Recheck in 1 year.    Leotis Pain, MD  06/30/2022 2:30 PM    This note was created with Dragon medical transcription system.  Any errors from dictation are purely unintentional

## 2022-06-30 NOTE — Assessment & Plan Note (Signed)
Her ABIs today are stable at 0.92 on the right 0.82 on the left with multiphasic waveforms and digital pressures of 102 on the right and 90 on the left.  Doing well after right lower extremity revascularization several years ago.  Perfusion is intact.  Continue current medical regimen.  Recheck in 1 year.

## 2022-07-03 LAB — VAS US ABI WITH/WO TBI
Left ABI: 0.82
Right ABI: 0.92

## 2022-07-07 ENCOUNTER — Other Ambulatory Visit: Payer: Self-pay | Admitting: Oncology

## 2022-07-07 ENCOUNTER — Other Ambulatory Visit: Payer: Self-pay

## 2022-07-07 ENCOUNTER — Other Ambulatory Visit (HOSPITAL_COMMUNITY): Payer: Self-pay

## 2022-07-07 DIAGNOSIS — C50912 Malignant neoplasm of unspecified site of left female breast: Secondary | ICD-10-CM

## 2022-07-08 ENCOUNTER — Encounter: Payer: Self-pay | Admitting: Oncology

## 2022-07-08 ENCOUNTER — Other Ambulatory Visit (HOSPITAL_COMMUNITY): Payer: Self-pay

## 2022-07-08 MED ORDER — PALBOCICLIB 75 MG PO TABS
75.0000 mg | ORAL_TABLET | Freq: Every day | ORAL | 3 refills | Status: DC
  Filled 2022-07-08: qty 21, 28d supply, fill #0
  Filled 2022-07-30: qty 21, 28d supply, fill #1
  Filled 2022-08-25: qty 21, 28d supply, fill #2
  Filled 2022-09-22: qty 21, 28d supply, fill #3

## 2022-07-10 ENCOUNTER — Other Ambulatory Visit (HOSPITAL_COMMUNITY): Payer: Self-pay

## 2022-07-13 ENCOUNTER — Other Ambulatory Visit: Payer: Self-pay | Admitting: Cardiology

## 2022-07-14 ENCOUNTER — Inpatient Hospital Stay: Payer: Medicare Other | Admitting: Oncology

## 2022-07-14 ENCOUNTER — Other Ambulatory Visit: Payer: Medicare Other

## 2022-07-14 ENCOUNTER — Encounter: Payer: Self-pay | Admitting: Oncology

## 2022-07-14 ENCOUNTER — Inpatient Hospital Stay: Payer: Medicare Other | Attending: Oncology

## 2022-07-14 ENCOUNTER — Ambulatory Visit: Payer: Medicare Other | Admitting: Oncology

## 2022-07-14 ENCOUNTER — Inpatient Hospital Stay: Payer: Medicare Other

## 2022-07-14 VITALS — BP 155/60 | HR 71 | Temp 97.7°F | Resp 18 | Ht 62.0 in | Wt 196.4 lb

## 2022-07-14 DIAGNOSIS — Z7901 Long term (current) use of anticoagulants: Secondary | ICD-10-CM | POA: Insufficient documentation

## 2022-07-14 DIAGNOSIS — Z7983 Long term (current) use of bisphosphonates: Secondary | ICD-10-CM

## 2022-07-14 DIAGNOSIS — Z79899 Other long term (current) drug therapy: Secondary | ICD-10-CM

## 2022-07-14 DIAGNOSIS — Z86718 Personal history of other venous thrombosis and embolism: Secondary | ICD-10-CM | POA: Insufficient documentation

## 2022-07-14 DIAGNOSIS — Z5181 Encounter for therapeutic drug level monitoring: Secondary | ICD-10-CM | POA: Diagnosis not present

## 2022-07-14 DIAGNOSIS — D702 Other drug-induced agranulocytosis: Secondary | ICD-10-CM | POA: Diagnosis not present

## 2022-07-14 DIAGNOSIS — Z79811 Long term (current) use of aromatase inhibitors: Secondary | ICD-10-CM | POA: Insufficient documentation

## 2022-07-14 DIAGNOSIS — Z801 Family history of malignant neoplasm of trachea, bronchus and lung: Secondary | ICD-10-CM | POA: Diagnosis not present

## 2022-07-14 DIAGNOSIS — Z8052 Family history of malignant neoplasm of bladder: Secondary | ICD-10-CM | POA: Insufficient documentation

## 2022-07-14 DIAGNOSIS — Z170 Estrogen receptor positive status [ER+]: Secondary | ICD-10-CM | POA: Insufficient documentation

## 2022-07-14 DIAGNOSIS — C50912 Malignant neoplasm of unspecified site of left female breast: Secondary | ICD-10-CM | POA: Diagnosis present

## 2022-07-14 DIAGNOSIS — C7951 Secondary malignant neoplasm of bone: Secondary | ICD-10-CM

## 2022-07-14 LAB — CBC WITH DIFFERENTIAL/PLATELET
Abs Immature Granulocytes: 0 10*3/uL (ref 0.00–0.07)
Basophils Absolute: 0 10*3/uL (ref 0.0–0.1)
Basophils Relative: 2 %
Eosinophils Absolute: 0 10*3/uL (ref 0.0–0.5)
Eosinophils Relative: 1 %
HCT: 30.8 % — ABNORMAL LOW (ref 36.0–46.0)
Hemoglobin: 10.9 g/dL — ABNORMAL LOW (ref 12.0–15.0)
Immature Granulocytes: 0 %
Lymphocytes Relative: 33 %
Lymphs Abs: 0.9 10*3/uL (ref 0.7–4.0)
MCH: 37.8 pg — ABNORMAL HIGH (ref 26.0–34.0)
MCHC: 35.4 g/dL (ref 30.0–36.0)
MCV: 106.9 fL — ABNORMAL HIGH (ref 80.0–100.0)
Monocytes Absolute: 0.2 10*3/uL (ref 0.1–1.0)
Monocytes Relative: 7 %
Neutro Abs: 1.6 10*3/uL — ABNORMAL LOW (ref 1.7–7.7)
Neutrophils Relative %: 57 %
Platelets: 145 10*3/uL — ABNORMAL LOW (ref 150–400)
RBC: 2.88 MIL/uL — ABNORMAL LOW (ref 3.87–5.11)
RDW: 15.3 % (ref 11.5–15.5)
WBC: 2.7 10*3/uL — ABNORMAL LOW (ref 4.0–10.5)
nRBC: 0 % (ref 0.0–0.2)

## 2022-07-14 LAB — CMP (CANCER CENTER ONLY)
ALT: 16 U/L (ref 0–44)
AST: 30 U/L (ref 15–41)
Albumin: 3.7 g/dL (ref 3.5–5.0)
Alkaline Phosphatase: 61 U/L (ref 38–126)
Anion gap: 9 (ref 5–15)
BUN: 14 mg/dL (ref 8–23)
CO2: 21 mmol/L — ABNORMAL LOW (ref 22–32)
Calcium: 9.2 mg/dL (ref 8.9–10.3)
Chloride: 102 mmol/L (ref 98–111)
Creatinine: 1.14 mg/dL — ABNORMAL HIGH (ref 0.44–1.00)
GFR, Estimated: 49 mL/min — ABNORMAL LOW (ref >60)
Glucose, Bld: 152 mg/dL — ABNORMAL HIGH (ref 70–99)
Potassium: 3.6 mmol/L (ref 3.5–5.1)
Sodium: 132 mmol/L — ABNORMAL LOW (ref 135–145)
Total Bilirubin: 0.9 mg/dL (ref 0.3–1.2)
Total Protein: 7.1 g/dL (ref 6.5–8.1)

## 2022-07-14 MED ORDER — SODIUM CHLORIDE 0.9% FLUSH
10.0000 mL | INTRAVENOUS | Status: DC | PRN
  Administered 2022-07-14: 10 mL via INTRAVENOUS
  Filled 2022-07-14: qty 10

## 2022-07-14 NOTE — Addendum Note (Signed)
Addended by: Luella Cook on: 07/14/2022 03:06 PM   Modules accepted: Orders

## 2022-07-14 NOTE — Progress Notes (Signed)
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  Patient has been on Ibrance and letrozole since then.  She had cytopenias with Leslee Home and is taking 75 mg 3 weeks on 1 week off which was started in September 2018.  Patient is on Zometa for bone metastases which she is getting every 3 months. Patient had extensive RLE DVT in April 2019 and is currently on eliquis. She also underwent venous thrombectomy and thrombolytic therapy and IVC placement by Dr. Lucky Cowboy.  She also has peripheral vascular disease secondary to atherosclerosis and underwent stenting with vascular surgery as well     Interval history-tolerating Ibrance and letrozole well without any significant side effects.  She feels that her left breast has a more of a lateral bulges on her right breast which sometimes makes it difficult for her to maneuver her arms around it.  ECOG PS- 1 Pain scale- 0   Review of systems- Review of Systems  Constitutional:  Negative for chills, fever, malaise/fatigue and weight loss.  HENT:  Negative for congestion, ear discharge and nosebleeds.   Eyes:  Negative for blurred vision.  Respiratory:  Negative for cough, hemoptysis, sputum production, shortness of breath and wheezing.   Cardiovascular:  Negative for chest pain,  palpitations, orthopnea and claudication.  Gastrointestinal:  Negative for abdominal pain, blood in stool, constipation, diarrhea, heartburn, melena, nausea and vomiting.  Genitourinary:  Negative for dysuria, flank pain, frequency, hematuria and urgency.  Musculoskeletal:  Negative for back pain, joint pain and myalgias.  Skin:  Negative for rash.  Neurological:  Negative for dizziness, tingling, focal weakness, seizures, weakness and headaches.  Endo/Heme/Allergies:  Does not bruise/bleed easily.  Psychiatric/Behavioral:  Negative for depression and suicidal ideas. The patient does not have insomnia.       Allergies  Allergen Reactions   Codeine Rash    hallucinations     Past Medical History:  Diagnosis Date   Coronary artery disease involving native coronary artery 08/30/2021   a. 08/2021 Cor CTA: Cor Ca2+ = 788.  Sev mRCA dzs, Mod LAD and LCx dzs; b. 10/2021 PCI: LM nl, LAD 75/pm (3.0x15 Onyx Frontier DES), 35m, LCX 75p (2.5x18 Onyx Frontier DES), RCA 100p/m w/ R->R and L->R collats to distal vessel/RPDA.   Diastolic dysfunction    a. 09/2021 Echo: EF 60-65%, no rwma, GrI DD, nl RV fxn.   DVT (deep venous thrombosis)    2019   Hypertension    Left Breast cancer ([place])    Port catheter in place 09/14/2016   Placed 09/10/2016 RT chest.     Past Surgical History:  Procedure Laterality Date   ABDOMINAL HYSTERECTOMY     APPENDECTOMY     BREAST BIOPSY     CORONARY STENT INTERVENTION N/A 10/16/2021   Procedure: CORONARY STENT INTERVENTION;  Surgeon: Leonie Man, MD;  Location: [place] [facility]:  Prox LAD 75%->0% Onyx Frontier DES 3 x 15 (3.3); Mid LCx 75% (prox to OM2) -> Onyx Frontier DES 2.5 x 18 -> tapered 2.8 to 2.6 mm   CT CTA CORONARY W/CA SCORE W/CM &/OR WO/CM  09/01/2021   Coronary Calcium Score 788.  Combination of calcified and noncalcified plaque-severe lesion in mid RCA (estimated greater than 70%).  Moderate LAD and LCx 50 to 69%). => CAD RADS 4 with severe  stenosis suggested in the RCA.  FFRCT not submitted due to motion artifact.-Recommended cardiac catheterization.   CYST EXCISION Right 09/2016   Buttocks   IVC FILTER REMOVAL N/A 01/17/2018   Procedure: IVC FILTER REMOVAL;  Surgeon: Algernon Huxley, MD;  Location: [place] [facility];  Service: Cardiovascular;  Laterality: N/A;   LEFT HEART CATH AND CORONARY ANGIOGRAPHY N/A 10/16/2021   Procedure: LEFT HEART CATH AND CORONARY ANGIOGRAPHY;  Surgeon: Leonie Man, MD;  Location: [facility]:: mRCA 100% CTO (L-R  & bridging collaterals); prox LAD 75%( DES PCI) - dLAD-rDPDA colleterals; mLCx 75% just proximal to the OM 2 (DES PCI).  Normal EDP.   LOWER EXTREMITY ANGIOGRAPHY Right 11/11/2017   Procedure: LOWER EXTREMITY ANGIOGRAPHY;  Surgeon: Katha Cabal, MD;  Location: [place] [facility];  Service: Cardiovascular;  Laterality: Right;   PERIPHERAL VASCULAR THROMBECTOMY Right 08/30/2017   Procedure: PERIPHERAL VASCULAR THROMBECTOMY;  Surgeon: Algernon Huxley, MD;  Location: [place] [facility];  Service: Cardiovascular;  Laterality: Right;   PORTACATH PLACEMENT Right 09/10/2016   Procedure: INSERTION PORT-A-CATH;  Surgeon: Jules Husbands, MD;  Location: [facility];  Service: General;  Laterality: Right;    Social History   Socioeconomic History   Marital status: Married    Spouse name: Jeneen Rinks   Number of children: 2   Years of education: 6   Highest education level: Bachelor's degree (e.g., BA, AB, BS)  Occupational History   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[employer]  Tobacco Use   Smoking status: Never   Smokeless tobacco: Never  Vaping Use   Vaping Use: Never used  Substance and Sexual Activity   Alcohol use: No   Drug use: No   Sexual activity: Never  Other Topics Concern   Not on file  Social History Narrative   Not on file   Social Determinants of Health   Financial Resource Strain: Low Risk  (04/05/2017)   Overall Financial Resource  Strain (CARDIA)    Difficulty of Paying Living Expenses: 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  Food Insecurity: No Food Insecurity (04/05/2017)   Hunger Vital Sign    Worried About Running Out of Food in the Last Year: Never true    Ran Out of Food in the Last Year: Never true  Transportation Needs: No Transportation Needs (04/05/2017)   PRAPARE - Hydrologist (Medical): No    Lack of Transportation (Non-Medical): No  Physical Activity: Unknown (04/05/2017)   Exercise Vital Sign    Days of Exercise per Week: Patient declined    Minutes of Exercise per Session: Patient declined  Stress: No Stress Concern Present (04/05/2017)   [place]    Feeling of Stress : Not at all  Social Connections: Unknown (04/05/2017)   Social Connection and Isolation Panel [NHANES]    Frequency of Communication with Friends and Family: More than three times a week    Frequency of Social Gatherings with Friends and  Family: Once a week    Attends Religious Services: More than 4 times per year    Active Member of Clubs or Organizations: No    Attends Music therapist: Never    Marital Status: Not on file  Intimate Partner Violence: Not on file    Family History  Problem Relation Age of Onset   Parkinson's disease Mother    Heart disease Father    Bladder Cancer Father    Lung cancer Father    Heart disease Maternal Grandfather      Current Outpatient Medications:    acetaminophen (TYLENOL) 500 MG tablet, Take 500 mg by mouth daily as needed for moderate pain or headache. , Disp: , Rfl:    amLODipine (NORVASC) 10 MG tablet, Take 10 mg by mouth daily., Disp: , Rfl:    apixaban (ELIQUIS) 5 MG TABS tablet, Take 1 tablet (5 mg total) by mouth 2 (two) times daily., Disp: 60 tablet, Rfl: 6   atorvastatin (LIPITOR) 20 MG tablet, Take 20 mg by mouth daily., Disp: , Rfl:    atorvastatin (LIPITOR) 40 MG tablet, Take 40 mg by  mouth at bedtime., Disp: , Rfl:    Calcium Carb-Cholecalciferol (CALCIUM 600/VITAMIN D3 PO), Take 1 tablet by mouth daily., Disp: , Rfl:    clopidogrel (PLAVIX) 75 MG tablet, Take 1 tablet (75 mg total) by mouth daily., Disp: 90 tablet, Rfl: 3   isosorbide mononitrate (IMDUR) 30 MG 24 hr tablet, Take 1 tablet by mouth once daily, Disp: 90 tablet, Rfl: 0   letrozole (FEMARA) 2.5 MG tablet, Take 1 tablet (2.5 mg total) by mouth daily., Disp: 90 tablet, Rfl: 5   lidocaine-prilocaine (EMLA) cream, Apply 1 application. topically as needed (port access)., Disp: 30 g, Rfl: 2   losartan (COZAAR) 50 MG tablet, Take 1 tablet by mouth once a day  for high blood pressure- to replace 100 mg, Disp: , Rfl:    metoprolol succinate (TOPROL-XL) 25 MG 24 hr tablet, Take 1 tablet by mouth once daily, Disp: 90 tablet, Rfl: 2   nitroGLYCERIN (NITROSTAT) 0.4 MG SL tablet, Place 1 tablet (0.4 mg total) under the tongue every 5 (five) minutes x 3 doses as needed for chest pain., Disp: 30 tablet, Rfl: 0   palbociclib (IBRANCE) 75 MG tablet, Take 1 tablet (75 mg total) by mouth daily. Take for 21 days on, 7 days off, repeat every 28 days., Disp: 21 tablet, Rfl: 3  Current Facility-Administered Medications:    sodium chloride flush (NS) 0.9 % injection 3 mL, 3 mL, Intravenous, Q12H, Leonie Man, MD  Facility-Administered Medications Ordered in Other Visits:    sodium chloride flush (NS) 0.9 % injection 10 mL, 10 mL, Intravenous, PRN, Sindy Guadeloupe, MD, 10 mL at 07/14/22 1352  Physical exam:  Vitals:   07/14/22 1420 07/14/22 1423  BP: (!) 153/53 (!) 155/60  Pulse: 66 71  Resp: 18   Temp: 97.7 F (36.5 C)   TempSrc: Tympanic   SpO2: 100%   Weight: 196 lb 6.4 oz (89.1 kg)   Height: 5\' 2"  (1.575 m)    Physical Exam HENT:     Head: Normocephalic and atraumatic.  Eyes:     Pupils: Pupils are equal, round, and reactive to light.  Cardiovascular:     Rate and Rhythm: Normal rate and regular rhythm.     Heart  sounds: Normal heart sounds.  Pulmonary:     Effort: Pulmonary effort is normal.     Breath  sounds: Normal breath sounds.  Abdominal:     General: Bowel sounds are normal.     Palpations: Abdomen is soft.  Musculoskeletal:     Cervical back: Normal range of motion.  Skin:    General: Skin is warm and dry.  Neurological:     Mental Status: She is alert and oriented to person, place, and time.   Breast exam: I am unable to palpate any distinct mass in the left breast.     Latest Ref Rng & Units 04/14/2022    1:41 PM  CMP  Glucose 70 - 99 mg/dL 169   BUN 8 - 23 mg/dL 15   Creatinine 0.44 - 1.00 mg/dL 1.17   Sodium 135 - 145 mmol/L 138   Potassium 3.5 - 5.1 mmol/L 3.9   Chloride 98 - 111 mmol/L 106   CO2 22 - 32 mmol/L 22   Calcium 8.9 - 10.3 mg/dL 9.3   Total Protein 6.5 - 8.1 g/dL 7.4   Total Bilirubin 0.3 - 1.2 mg/dL 1.0   Alkaline Phos 38 - 126 U/L 62   AST 15 - 41 U/L 29   ALT 0 - 44 U/L 15       Latest Ref Rng & Units 04/14/2022    1:41 PM  CBC  WBC 4.0 - 10.5 K/uL 3.4   Hemoglobin 12.0 - 15.0 g/dL 11.4   Hematocrit 36.0 - 46.0 % 32.2   Platelets 150 - 400 K/uL 198     No images are attached to the encounter.  VAS [place] ABI WITH/WO TBI  Result Date: 07/03/2022  LOWER EXTREMITY DOPPLER STUDY Patient Name:  Victoria Steele  Date of Exam:   06/30/2022 Medical Rec #: [id number]       Accession #:    [id number] Date of Birth: 10/24/1940       Patient Gender: F Patient Age:   31 years Exam Location:  [facility] Procedure:      VAS [place] ABI WITH/WO TBI Referring Phys: Leotis Pain --------------------------------------------------------------------------------  Indications: Claudication, peripheral artery disease, and Right foot              discoloration. High Risk Factors: Hypertension, no history of smoking, coronary artery                    disease.  Vascular Interventions: 08/30/17: Right iliac, common femoral, femoral &                         popliteal vein  thrombolysis/thrombectomy/PTA. Performing Technologist: Delorise Shiner RVT  Examination Guidelines: A complete evaluation includes at minimum, Doppler waveform signals and systolic blood pressure reading at the level of bilateral brachial, anterior tibial, and posterior tibial arteries, when vessel segments are accessible. Bilateral testing is considered an integral part of a complete examination. Photoelectric Plethysmograph (PPG) waveforms and toe systolic pressure readings are included as required and additional duplex testing as needed. Limited examinations for reoccurring indications may be performed as noted.  ABI Findings: +---------+------------------+-----+---------+--------+ Right    Rt Pressure (mmHg)IndexWaveform Comment  +---------+------------------+-----+---------+--------+ Brachial 171                                      +---------+------------------+-----+---------+--------+ PTA      158               0.92 triphasic         +---------+------------------+-----+---------+--------+  DP       156               0.91 biphasic          +---------+------------------+-----+---------+--------+ Great Toe102               0.60                   +---------+------------------+-----+---------+--------+ +---------+------------------+-----+---------+-------+ Left     Lt Pressure (mmHg)IndexWaveform Comment +---------+------------------+-----+---------+-------+ Brachial 163                                     +---------+------------------+-----+---------+-------+ PTA      140               0.82 triphasic        +---------+------------------+-----+---------+-------+ DP       125               0.73 biphasic         +---------+------------------+-----+---------+-------+ Great Toe91                0.53                  +---------+------------------+-----+---------+-------+ +-------+-----------+-----------+------------+------------+ ABI/TBIToday's ABIToday's  TBIPrevious ABIPrevious TBI +-------+-----------+-----------+------------+------------+ Right  0.92       0.60       0.92        0.75         +-------+-----------+-----------+------------+------------+ Left   0.82       0.53       0.72        0.61         +-------+-----------+-----------+------------+------------+ Right ABIs appear essentially unchanged compared to prior study on 06/24/2021. Left ABIs appear increased compared to prior study on 06/24/2021.  Summary: Right: Resting right ankle-brachial index indicates mild right lower extremity arterial disease. The right toe-brachial index is abnormal. Left: Resting left ankle-brachial index indicates mild left lower extremity arterial disease. The left toe-brachial index is abnormal. *See table(s) above for measurements and observations.  Electronically signed by Leotis Pain MD on 07/03/2022 at 11:25:50 AM.    Final      Assessment and plan- Patient is a 82 y.o. female with metastatic ER positive HER2 negative breast cancer with bone metastases.  This is a routine follow-up visit for breast cancer on letrozole plus Ibrance  Patient has been on letrozole plus Ibrance for over 5 years now.  She is tolerating the combination well without any significant side effects.  I will plan to get a repeat CT chest abdomen and pelvis with contrast and a bone scan sometime in June 2024 and see her thereafter.  Patient is bone metastases Zometa she has been giving her every 3 -6 months. Given that it has been 5 years now since starting Zometa,.  I will hold off on giving her any further Zometa due to cumulative increase in the risk of side effects  I do not palpate any distinct left breast mass at this time.  Patient does have a more of a lateral bulge with her left breast as compared to right breast.  However given that she has metastatic breast cancer with bone mets I do not see a reason to undergo a routine screening mammogram at this time.  We will await  CT scan chest to be done next month  I will see her back after systemic scans in June  or early July 2024 with port labs Visit Diagnosis 1. High risk medication use   2. Drug-induced neutropenia   3. Encounter for monitoring zoledronic acid therapy   4. Malignant neoplasm metastatic to bone   5. Current use of long term anticoagulation      Dr. Randa Evens, MD, MPH [facility] at [facility] [id number] 07/14/2022 1:58 PM

## 2022-07-15 LAB — CANCER ANTIGEN 27.29: CA 27.29: 65.1 U/mL — ABNORMAL HIGH (ref 0.0–38.6)

## 2022-07-30 ENCOUNTER — Other Ambulatory Visit (HOSPITAL_COMMUNITY): Payer: Self-pay

## 2022-08-03 ENCOUNTER — Other Ambulatory Visit: Payer: Self-pay

## 2022-08-25 ENCOUNTER — Ambulatory Visit: Admission: RE | Admit: 2022-08-25 | Payer: Medicare Other | Source: Ambulatory Visit | Admitting: *Deleted

## 2022-08-25 ENCOUNTER — Other Ambulatory Visit: Payer: Self-pay | Admitting: Family Medicine

## 2022-08-25 ENCOUNTER — Other Ambulatory Visit (HOSPITAL_COMMUNITY): Payer: Self-pay

## 2022-08-25 ENCOUNTER — Ambulatory Visit
Admission: RE | Admit: 2022-08-25 | Discharge: 2022-08-25 | Disposition: A | Discharge status: Home / Self Care | Payer: Medicare Other | Source: Ambulatory Visit | Attending: Family Medicine | Admitting: Family Medicine

## 2022-08-25 DIAGNOSIS — L03115 Cellulitis of right lower limb: Secondary | ICD-10-CM

## 2022-08-31 ENCOUNTER — Other Ambulatory Visit (HOSPITAL_COMMUNITY): Payer: Self-pay

## 2022-09-17 ENCOUNTER — Other Ambulatory Visit: Payer: Self-pay | Admitting: Oncology

## 2022-09-18 ENCOUNTER — Telehealth (INDEPENDENT_AMBULATORY_CARE_PROVIDER_SITE_OTHER): Payer: Self-pay

## 2022-09-22 ENCOUNTER — Other Ambulatory Visit (HOSPITAL_COMMUNITY): Payer: Self-pay

## 2022-09-23 NOTE — Telephone Encounter (Signed)
Pt scheduled 09/30/2022

## 2022-09-25 ENCOUNTER — Other Ambulatory Visit (HOSPITAL_COMMUNITY): Payer: Self-pay

## 2022-09-30 ENCOUNTER — Encounter (INDEPENDENT_AMBULATORY_CARE_PROVIDER_SITE_OTHER): Payer: Self-pay | Admitting: Nurse Practitioner

## 2022-09-30 ENCOUNTER — Ambulatory Visit (INDEPENDENT_AMBULATORY_CARE_PROVIDER_SITE_OTHER): Payer: Medicare Other | Admitting: Nurse Practitioner

## 2022-09-30 ENCOUNTER — Ambulatory Visit (INDEPENDENT_AMBULATORY_CARE_PROVIDER_SITE_OTHER): Payer: Medicare Other

## 2022-09-30 VITALS — BP 148/63 | HR 67 | Resp 16 | Wt 195.0 lb

## 2022-09-30 DIAGNOSIS — I739 Peripheral vascular disease, unspecified: Secondary | ICD-10-CM

## 2022-09-30 DIAGNOSIS — L03031 Cellulitis of right toe: Secondary | ICD-10-CM | POA: Diagnosis not present

## 2022-09-30 DIAGNOSIS — I10 Essential (primary) hypertension: Secondary | ICD-10-CM | POA: Diagnosis not present

## 2022-09-30 DIAGNOSIS — L02611 Cutaneous abscess of right foot: Secondary | ICD-10-CM

## 2022-10-01 ENCOUNTER — Encounter (INDEPENDENT_AMBULATORY_CARE_PROVIDER_SITE_OTHER): Payer: Self-pay | Admitting: Nurse Practitioner

## 2022-10-01 LAB — VAS US ABI WITH/WO TBI
Left ABI: 0.76
Right ABI: 0.98

## 2022-10-01 NOTE — Progress Notes (Signed)
Subjective:    Patient ID: Victoria Steele, female    DOB: [birth year], 82 y.o.   MRN: [id number] Chief Complaint  Patient presents with   Follow-up    Right big toe swollen, tip is very sensitive to touch    Victoria Steele is a 82 year old female who presents today due to redness and pain and swelling in the right lower extremity.  She notes that about a month or so ago she began to have pain and swelling in the right lower extremity primarily in the toe area.  She notes that the toe was very painful.  Initially there was some concern that there may be gout but x-rays did not show any significant evidence of gout.  Eventually she began to develop bleeding on the quarter portion of her toe and it was red and swollen and she received antibiotics.  Following antibiotics the swelling and pain have decreased but there is still some slight redness present.  There is also a scab in the medial tip of her great toe there appears to be some shadowing underneath the toe indicative of a scab underneath the toenail.  She has notable peripheral arterial disease and so we evaluate her circulation to ensure that this was not arterial disease related.  Today the patient has ABI 0.98 on the right and 0.76 on the left.  She has a TBI 0.58 on the right and 0.46 on the left.  Triphasic tibial artery waveforms in the bilateral tibial vessels.    Review of Systems  Skin:  Positive for wound.  All other systems reviewed and are negative.      Objective:   Physical Exam Vitals reviewed.  HENT:     Head: Normocephalic.  Cardiovascular:     Rate and Rhythm: Normal rate.     Pulses:          Dorsalis pedis pulses are detected w/ Doppler on the right side and detected w/ Doppler on the left side.       Posterior tibial pulses are detected w/ Doppler on the right side and detected w/ Doppler on the left side.  Pulmonary:     Effort: Pulmonary effort is normal.  Skin:    General: Skin is warm and dry.   Neurological:     Mental Status: She is alert and oriented to person, place, and time.  Psychiatric:        Mood and Affect: Mood normal.        Behavior: Behavior normal.        Thought Content: Thought content normal.        Judgment: Judgment normal.     BP (!) 148/63 (BP Location: Left Arm)   Pulse 67   Resp 16   Wt 195 lb (88.5 kg)   BMI 35.67 kg/m   Past Medical History:  Diagnosis Date   Coronary artery disease involving native coronary artery 08/30/2021   a. 08/2021 Cor CTA: Cor Ca2+ = 788.  Sev mRCA dzs, Mod LAD and LCx dzs; b. 10/2021 PCI: LM nl, LAD 75/pm (3.0x15 Onyx Frontier DES), 5m, LCX 75p (2.5x18 Onyx Frontier DES), RCA 100p/m w/ R->R and L->R collats to distal vessel/RPDA.   Diastolic dysfunction    a. 09/2021 Echo: EF 60-65%, no rwma, GrI DD, nl RV fxn.   DVT (deep venous thrombosis) (HCC)    2019   Hypertension    Left Breast cancer (HCC)    Port catheter in place 09/14/2016   Placed  09/10/2016 RT chest.    Social History   Socioeconomic History   Marital status: Married    Spouse name: Fayrene Fearing   Number of children: 2   Years of education: 6   Highest education level: Bachelor's degree (e.g., BA, AB, BS)  Occupational History   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[employer]  Tobacco Use   Smoking status: Never   Smokeless tobacco: Never  Vaping Use   Vaping Use: Never used  Substance and Sexual Activity   Alcohol use: No   Drug use: No   Sexual activity: Never  Other Topics Concern   Not on file  Social History Narrative   Not on file   Social Determinants of Health   Financial Resource Strain: Low Risk  (04/05/2017)   Overall Financial Resource Strain (CARDIA)    Difficulty of Paying Living Expenses: 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  Food Insecurity: No Food Insecurity (04/05/2017)   Hunger Vital Sign    Worried About Running Out of Food in the Last Year: Never true    Ran Out of Food in the Last Year: Never true  Transportation  Needs: No Transportation Needs (04/05/2017)   PRAPARE - Administrator, Civil Service (Medical): No    Lack of Transportation (Non-Medical): No  Physical Activity: Unknown (04/05/2017)   Exercise Vital Sign    Days of Exercise per Week: Patient declined    Minutes of Exercise per Session: Patient declined  Stress: No Stress Concern Present (04/05/2017)   [employer] of Occupational Health - Occupational Stress Questionnaire    Feeling of Stress : Not at all  Social Connections: Unknown (04/05/2017)   Social Connection and Isolation Panel [NHANES]    Frequency of Communication with Friends and Family: More than three times a week    Frequency of Social Gatherings with Friends and Family: Once a week    Attends Religious Services: More than 4 times per year    Active Member of [employer] or Organizations: No    Attends Banker Meetings: Never    Marital Status: Not on file  Intimate Partner Violence: Not on file    Past Surgical History:  Procedure Laterality Date   ABDOMINAL HYSTERECTOMY     APPENDECTOMY     BREAST BIOPSY     CORONARY STENT INTERVENTION N/A 10/16/2021   Procedure: CORONARY STENT INTERVENTION;  Surgeon: Marykay Lex, MD;  Location: [facility]: Prox LAD 75%->0% Onyx Frontier DES 3 x 15 (3.3); Mid LCx 75% (prox to OM2) -> Onyx Frontier DES 2.5 x 18 -> tapered 2.8 to 2.6 mm   CT CTA CORONARY W/CA SCORE W/CM &/OR WO/CM  09/01/2021   Coronary Calcium Score 788.  Combination of calcified and noncalcified plaque-severe lesion in mid RCA (estimated greater than 70%).  Moderate LAD and LCx 50 to 69%). => CAD RADS 4 with severe stenosis suggested in the RCA.  FFRCT not submitted due to motion artifact.-Recommended cardiac catheterization.   CYST EXCISION Right 09/2016   Buttocks   IVC FILTER REMOVAL N/A 01/17/2018   Procedure: IVC FILTER REMOVAL;  Surgeon: Annice Needy, MD;  Location: [facility];  Service: Cardiovascular;   Laterality: N/A;   LEFT HEART CATH AND CORONARY ANGIOGRAPHY N/A 10/16/2021   Procedure: LEFT HEART CATH AND CORONARY ANGIOGRAPHY;  Surgeon: Marykay Lex, MD;  Location: [facility]:: mRCA 100% CTO (L-R  & bridging collaterals); prox LAD 75%( DES PCI) -  dLAD-rDPDA colleterals; mLCx 75% just proximal to the OM 2 (DES PCI).  Normal EDP.   LOWER EXTREMITY ANGIOGRAPHY Right 11/11/2017   Procedure: LOWER EXTREMITY ANGIOGRAPHY;  Surgeon: Renford Dills, MD;  Location: [facility];  Service: Cardiovascular;  Laterality: Right;   PERIPHERAL VASCULAR THROMBECTOMY Right 08/30/2017   Procedure: PERIPHERAL VASCULAR THROMBECTOMY;  Surgeon: Annice Needy, MD;  Location: [facility];  Service: Cardiovascular;  Laterality: Right;   PORTACATH PLACEMENT Right 09/10/2016   Procedure: INSERTION PORT-A-CATH;  Surgeon: Leafy Ro, MD;  Location: [facility];  Service: General;  Laterality: Right;    Family History  Problem Relation Age of Onset   Parkinson's disease Mother    Heart disease Father    Bladder Cancer Father    Lung cancer Father    Heart disease Maternal Grandfather     Allergies  Allergen Reactions   Codeine Rash    hallucinations       Latest Ref Rng & Units 07/14/2022    1:50 PM 04/14/2022    1:41 PM 01/12/2022   12:54 PM  CBC  WBC 4.0 - 10.5 K/uL 2.7  3.4  2.5   Hemoglobin 12.0 - 15.0 g/dL 40.9  81.1  91.4   Hematocrit 36.0 - 46.0 % 30.8  32.2  34.4   Platelets 150 - 400 K/uL 145  198  293       CMP     Component Value Date/Time   NA 132 (L) 07/14/2022 1351   K 3.6 07/14/2022 1351   CL 102 07/14/2022 1351   CO2 21 (L) 07/14/2022 1351   GLUCOSE 152 (H) 07/14/2022 1351   BUN 14 07/14/2022 1351   CREATININE 1.14 (H) 07/14/2022 1351   CALCIUM 9.2 07/14/2022 1351   PROT 7.1 07/14/2022 1351   ALBUMIN 3.7 07/14/2022 1351   AST 30 07/14/2022 1351   ALT 16 07/14/2022 1351   ALKPHOS 61 07/14/2022 1351   BILITOT 0.9 07/14/2022 1351   GFRNONAA 49  (L) 07/14/2022 1351     No results found.     Assessment & Plan:   1. Abscess or cellulitis of toe, right I suspect that the cellulitis occurred due to in her toenail that caused wound on her toe.  There is still some scabbing present at the upper tip of the toe and in evaluating the toenail there appears to be some wound underneath the toenail as well.  We will send the patient to podiatry for evaluation and treatment of the area. - Ambulatory referral to Podiatry  2. PAD (peripheral artery disease) (HCC) Today the patient does have evidence of peripheral arterial disease but it is mild.  Her ABIs on the right today were not significantly changed from the previous ABIs that she had done in March.  There is some deterioration in the left but currently she has no issues or symptoms.  Currently no role for intervention.  3. Essential hypertension Continue antihypertensive medications as already ordered, these medications have been reviewed and there are no changes at this time.   Current Outpatient Medications on File Prior to Visit  Medication Sig Dispense Refill   acetaminophen (TYLENOL) 500 MG tablet Take 500 mg by mouth daily as needed for moderate pain or headache.      amLODipine (NORVASC) 10 MG tablet Take 10 mg by mouth daily.     atorvastatin (LIPITOR) 20 MG tablet Take 20 mg by mouth daily.     atorvastatin (LIPITOR) 40 MG tablet  Take 40 mg by mouth at bedtime.     Calcium Carb-Cholecalciferol (CALCIUM 600/VITAMIN D3 PO) Take 1 tablet by mouth daily.     clopidogrel (PLAVIX) 75 MG tablet Take 1 tablet (75 mg total) by mouth daily. 90 tablet 3   ELIQUIS 5 MG TABS tablet Take 1 tablet by mouth twice daily 60 tablet 0   isosorbide mononitrate (IMDUR) 30 MG 24 hr tablet Take 1 tablet by mouth once daily 90 tablet 0   letrozole (FEMARA) 2.5 MG tablet Take 1 tablet (2.5 mg total) by mouth daily. 90 tablet 5   lidocaine-prilocaine (EMLA) cream Apply 1 application. topically as needed  (port access). 30 g 2   losartan (COZAAR) 50 MG tablet Take 1 tablet by mouth once a day  for high blood pressure- to replace 100 mg     metoprolol succinate (TOPROL-XL) 25 MG 24 hr tablet Take 1 tablet by mouth once daily 90 tablet 2   nitroGLYCERIN (NITROSTAT) 0.4 MG SL tablet Place 1 tablet (0.4 mg total) under the tongue every 5 (five) minutes x 3 doses as needed for chest pain. 30 tablet 0   palbociclib (IBRANCE) 75 MG tablet Take 1 tablet (75 mg total) by mouth daily. Take for 21 days on, 7 days off, repeat every 28 days. 21 tablet 3   Current Facility-Administered Medications on File Prior to Visit  Medication Dose Route Frequency Provider Last Rate Last Admin   sodium chloride flush (NS) 0.9 % injection 3 mL  3 mL Intravenous Q12H Marykay Lex, MD        There are no Patient Instructions on file for this visit. No follow-ups on file.   Georgiana Spinner, NP

## 2022-10-07 ENCOUNTER — Encounter
Admission: RE | Admit: 2022-10-07 | Discharge: 2022-10-07 | Disposition: A | Discharge status: Home / Self Care | Payer: Medicare Other | Source: Ambulatory Visit | Attending: Oncology | Admitting: Oncology

## 2022-10-07 ENCOUNTER — Ambulatory Visit
Admission: RE | Admit: 2022-10-07 | Discharge: 2022-10-07 | Disposition: A | Discharge status: Home / Self Care | Payer: Medicare Other | Source: Ambulatory Visit | Attending: Oncology | Admitting: Oncology

## 2022-10-07 DIAGNOSIS — C7951 Secondary malignant neoplasm of bone: Secondary | ICD-10-CM

## 2022-10-07 DIAGNOSIS — D702 Other drug-induced agranulocytosis: Secondary | ICD-10-CM

## 2022-10-07 LAB — POCT I-STAT CREATININE: Creatinine, Ser: 1.1 mg/dL — ABNORMAL HIGH (ref 0.44–1.00)

## 2022-10-07 MED ORDER — IOHEXOL 300 MG/ML  SOLN
100.0000 mL | Freq: Once | INTRAMUSCULAR | Status: AC | PRN
  Administered 2022-10-07: 100 mL via INTRAVENOUS

## 2022-10-07 MED ORDER — TECHNETIUM TC 99M MEDRONATE IV KIT
20.0000 | PACK | Freq: Once | INTRAVENOUS | Status: AC | PRN
  Administered 2022-10-07: 21.45 via INTRAVENOUS

## 2022-10-10 ENCOUNTER — Other Ambulatory Visit: Payer: Self-pay | Admitting: Cardiology

## 2022-10-18 ENCOUNTER — Other Ambulatory Visit: Payer: Self-pay | Admitting: Oncology

## 2022-10-19 ENCOUNTER — Encounter: Payer: Self-pay | Admitting: Oncology

## 2022-10-20 ENCOUNTER — Encounter: Payer: Self-pay | Admitting: Oncology

## 2022-10-20 ENCOUNTER — Inpatient Hospital Stay: Payer: Medicare Other | Admitting: Oncology

## 2022-10-20 ENCOUNTER — Other Ambulatory Visit (HOSPITAL_COMMUNITY): Payer: Self-pay

## 2022-10-20 ENCOUNTER — Inpatient Hospital Stay: Payer: Medicare Other | Attending: Oncology

## 2022-10-20 VITALS — BP 123/59 | HR 69 | Temp 98.8°F | Resp 18 | Ht 62.0 in | Wt 196.0 lb

## 2022-10-20 DIAGNOSIS — I739 Peripheral vascular disease, unspecified: Secondary | ICD-10-CM | POA: Insufficient documentation

## 2022-10-20 DIAGNOSIS — D702 Other drug-induced agranulocytosis: Secondary | ICD-10-CM

## 2022-10-20 DIAGNOSIS — C50912 Malignant neoplasm of unspecified site of left female breast: Secondary | ICD-10-CM | POA: Diagnosis present

## 2022-10-20 DIAGNOSIS — Z7983 Long term (current) use of bisphosphonates: Secondary | ICD-10-CM | POA: Insufficient documentation

## 2022-10-20 DIAGNOSIS — Z86718 Personal history of other venous thrombosis and embolism: Secondary | ICD-10-CM | POA: Diagnosis not present

## 2022-10-20 DIAGNOSIS — Z801 Family history of malignant neoplasm of trachea, bronchus and lung: Secondary | ICD-10-CM | POA: Insufficient documentation

## 2022-10-20 DIAGNOSIS — Z8052 Family history of malignant neoplasm of bladder: Secondary | ICD-10-CM | POA: Diagnosis not present

## 2022-10-20 DIAGNOSIS — T451X5A Adverse effect of antineoplastic and immunosuppressive drugs, initial encounter: Secondary | ICD-10-CM | POA: Diagnosis not present

## 2022-10-20 DIAGNOSIS — Z7901 Long term (current) use of anticoagulants: Secondary | ICD-10-CM | POA: Insufficient documentation

## 2022-10-20 DIAGNOSIS — Z79811 Long term (current) use of aromatase inhibitors: Secondary | ICD-10-CM | POA: Diagnosis not present

## 2022-10-20 DIAGNOSIS — Z79899 Other long term (current) drug therapy: Secondary | ICD-10-CM | POA: Diagnosis not present

## 2022-10-20 DIAGNOSIS — Z95828 Presence of other vascular implants and grafts: Secondary | ICD-10-CM

## 2022-10-20 DIAGNOSIS — C7951 Secondary malignant neoplasm of bone: Secondary | ICD-10-CM

## 2022-10-20 DIAGNOSIS — Z170 Estrogen receptor positive status [ER+]: Secondary | ICD-10-CM | POA: Insufficient documentation

## 2022-10-20 DIAGNOSIS — D701 Agranulocytosis secondary to cancer chemotherapy: Secondary | ICD-10-CM | POA: Insufficient documentation

## 2022-10-20 LAB — COMPREHENSIVE METABOLIC PANEL
ALT: 14 U/L (ref 0–44)
AST: 24 U/L (ref 15–41)
Albumin: 3.4 g/dL — ABNORMAL LOW (ref 3.5–5.0)
Alkaline Phosphatase: 57 U/L (ref 38–126)
Anion gap: 10 (ref 5–15)
BUN: 15 mg/dL (ref 8–23)
CO2: 23 mmol/L (ref 22–32)
Calcium: 8.1 mg/dL — ABNORMAL LOW (ref 8.9–10.3)
Chloride: 104 mmol/L (ref 98–111)
Creatinine, Ser: 0.99 mg/dL (ref 0.44–1.00)
GFR, Estimated: 58 mL/min — ABNORMAL LOW (ref >60)
Glucose, Bld: 182 mg/dL — ABNORMAL HIGH (ref 70–99)
Potassium: 3.4 mmol/L — ABNORMAL LOW (ref 3.5–5.1)
Sodium: 137 mmol/L (ref 135–145)
Total Bilirubin: 1 mg/dL (ref 0.3–1.2)
Total Protein: 6.6 g/dL (ref 6.5–8.1)

## 2022-10-20 LAB — CBC WITH DIFFERENTIAL/PLATELET
Abs Immature Granulocytes: 0.01 10*3/uL (ref 0.00–0.07)
Basophils Absolute: 0 10*3/uL (ref 0.0–0.1)
Basophils Relative: 1 %
Eosinophils Absolute: 0 10*3/uL (ref 0.0–0.5)
Eosinophils Relative: 1 %
HCT: 30.8 % — ABNORMAL LOW (ref 36.0–46.0)
Hemoglobin: 10.8 g/dL — ABNORMAL LOW (ref 12.0–15.0)
Immature Granulocytes: 1 %
Lymphocytes Relative: 37 %
Lymphs Abs: 0.8 10*3/uL (ref 0.7–4.0)
MCH: 37.9 pg — ABNORMAL HIGH (ref 26.0–34.0)
MCHC: 35.1 g/dL (ref 30.0–36.0)
MCV: 108.1 fL — ABNORMAL HIGH (ref 80.0–100.0)
Monocytes Absolute: 0.1 10*3/uL (ref 0.1–1.0)
Monocytes Relative: 6 %
Neutro Abs: 1.2 10*3/uL — ABNORMAL LOW (ref 1.7–7.7)
Neutrophils Relative %: 54 %
Platelets: 174 10*3/uL (ref 150–400)
RBC: 2.85 MIL/uL — ABNORMAL LOW (ref 3.87–5.11)
RDW: 14.8 % (ref 11.5–15.5)
WBC: 2.2 10*3/uL — ABNORMAL LOW (ref 4.0–10.5)
nRBC: 0 % (ref 0.0–0.2)

## 2022-10-20 MED ORDER — SODIUM CHLORIDE 0.9% FLUSH
10.0000 mL | Freq: Once | INTRAVENOUS | Status: AC
  Administered 2022-10-20: 10 mL via INTRAVENOUS
  Filled 2022-10-20: qty 10

## 2022-10-20 MED ORDER — HEPARIN SOD (PORK) LOCK FLUSH 100 UNIT/ML IV SOLN
500.0000 [IU] | Freq: Once | INTRAVENOUS | Status: AC
  Administered 2022-10-20: 500 [IU] via INTRAVENOUS
  Filled 2022-10-20: qty 5

## 2022-10-20 NOTE — Progress Notes (Signed)
Patient reports nausea and back and forth constipation/diarrhea

## 2022-10-20 NOTE — Progress Notes (Signed)
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  Patient has been on Ibrance and letrozole since then.  She had cytopenias with [name] and is taking 75 mg 3 weeks on 1 week off which was started in September 2018.  Patient is on Zometa for bone metastases which she is getting every 3 months. Patient had extensive RLE DVT in April 2019 and is currently on eliquis. She also underwent venous thrombectomy and thrombolytic therapy and IVC placement by Dr. Wyn Quaker.  She also has peripheral vascular disease secondary to atherosclerosis and underwent stenting with vascular surgery as well.  Patient was on Zometa for bone metastases for 5 years and it was later on discontinued      Interval history-patient feels occasional hardness in her left breast which comes and goes.  Overall tolerating Ibrance and letrozole well without any significant side effects.  Occasional constipation well-controlled  ECOG PS- 1 Pain scale- 0   Review of systems- Review of Systems  Constitutional:  Negative for chills, fever, malaise/fatigue and weight loss.  HENT:  Negative for congestion, ear discharge and nosebleeds.   Eyes:  Negative for blurred vision.  Respiratory:  Negative for cough, hemoptysis, sputum  production, shortness of breath and wheezing.   Cardiovascular:  Negative for chest pain, palpitations, orthopnea and claudication.  Gastrointestinal:  Negative for abdominal pain, blood in stool, constipation, diarrhea, heartburn, melena, nausea and vomiting.  Genitourinary:  Negative for dysuria, flank pain, frequency, hematuria and urgency.  Musculoskeletal:  Negative for back pain, joint pain and myalgias.  Skin:  Negative for rash.  Neurological:  Negative for dizziness, tingling, focal weakness, seizures, weakness and headaches.  Endo/Heme/Allergies:  Does not bruise/bleed easily.  Psychiatric/Behavioral:  Negative for depression and suicidal ideas. The patient does not have insomnia.       Allergies  Allergen Reactions   Codeine Rash    hallucinations     Past Medical History:  Diagnosis Date   Coronary artery disease involving native coronary artery 08/30/2021   a. 08/2021 Cor CTA: Cor Ca2+ = 788.  Sev mRCA dzs, Mod LAD and LCx dzs; b. 10/2021 PCI: LM nl, LAD 75/pm (3.0x15 Onyx Frontier DES), 62m, LCX 75p (2.5x18 Onyx Frontier DES), RCA 100p/m w/ R->R and L->R collats to distal vessel/RPDA.   Diastolic dysfunction    a. 09/2021 Echo: EF 60-65%, no rwma, GrI DD, nl RV fxn.   DVT (deep venous thrombosis) (HCC)    2019   Hypertension    Left Breast cancer (HCC)    Port catheter in place 09/14/2016   Placed 09/10/2016 RT chest.     Past Surgical History:  Procedure Laterality Date   ABDOMINAL HYSTERECTOMY     APPENDECTOMY     BREAST BIOPSY     CORONARY STENT INTERVENTION N/A 10/16/2021   Procedure: CORONARY STENT INTERVENTION;  Surgeon: Marykay Lex, MD;  Location: [facility] INVASIVE CV LAB: Prox LAD 75%->0% Onyx Frontier DES 3 x 15 (3.3); Mid LCx 75% (prox to OM2) -> Onyx Frontier DES 2.5 x 18 -> tapered 2.8 to 2.6 mm   CT CTA CORONARY W/CA SCORE W/CM &/OR WO/CM  09/01/2021   Coronary Calcium Score 788.  Combination of calcified and noncalcified plaque-severe lesion in mid  RCA (estimated greater than 70%).  Moderate LAD and LCx 50 to 69%). => CAD RADS 4 with severe stenosis suggested in the RCA.  FFRCT not submitted due to motion artifact.-Recommended cardiac catheterization.   CYST EXCISION Right 09/2016   Buttocks   IVC FILTER REMOVAL N/A 01/17/2018   Procedure: IVC FILTER REMOVAL;  Surgeon: Annice Needy, MD;  Location: [facility] INVASIVE CV LAB;  Service: Cardiovascular;  Laterality: N/A;   LEFT HEART CATH AND CORONARY ANGIOGRAPHY N/A 10/16/2021   Procedure: LEFT HEART CATH AND CORONARY ANGIOGRAPHY;  Surgeon: Marykay Lex, MD;  Location: [facility] INVASIVE CV LAB:: mRCA 100% CTO (L-R  & bridging collaterals); prox LAD 75%( DES PCI) - dLAD-rDPDA colleterals; mLCx 75% just proximal to the OM 2 (DES PCI).  Normal EDP.   LOWER EXTREMITY ANGIOGRAPHY Right 11/11/2017   Procedure: LOWER EXTREMITY ANGIOGRAPHY;  Surgeon: Renford Dills, MD;  Location: [facility] INVASIVE CV LAB;  Service: Cardiovascular;  Laterality: Right;   PERIPHERAL VASCULAR THROMBECTOMY Right 08/30/2017   Procedure: PERIPHERAL VASCULAR THROMBECTOMY;  Surgeon: Annice Needy, MD;  Location: [facility] CV LAB;  Service: Cardiovascular;  Laterality: Right;   PORTACATH PLACEMENT Right 09/10/2016   Procedure: INSERTION PORT-A-CATH;  Surgeon: Leafy Ro, MD;  Location: [facility] ORS;  Service: General;  Laterality: Right;    Social History   Socioeconomic History   Marital status: Married    Spouse name: Fayrene Fearing   Number of children: 2   Years of education: 6   Highest education level: Bachelor's degree (e.g., BA, AB, BS)  Occupational History   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[employer]  Tobacco Use   Smoking status: Never   Smokeless tobacco: Never  Vaping Use   Vaping Use: Never used  Substance and Sexual Activity   Alcohol use: No   Drug use: No   Sexual activity: Never  Other Topics Concern   Not on file  Social History Narrative   Not on file   Social Determinants of  Health   Financial Resource Strain: Low Risk  (04/05/2017)   Overall Financial Resource Strain (CARDIA)    Difficulty of Paying Living Expenses: 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  Food Insecurity: No Food Insecurity (04/05/2017)   Hunger Vital Sign    Worried About Running Out of Food in the Last Year: Never true    Ran Out of Food in the Last Year: Never true  Transportation Needs: No Transportation Needs (04/05/2017)   PRAPARE - Administrator, Civil Service (Medical): No    Lack of Transportation (Non-Medical): No  Physical Activity: Unknown (04/05/2017)   Exercise Vital Sign    Days of Exercise per Week: Patient declined    Minutes of Exercise per Session: Patient declined  Stress: No Stress Concern Present (04/05/2017)   [employer] of Occupational Health - Occupational Stress Questionnaire    Feeling of Stress : Not at all  Social Connections: Unknown (04/05/2017)   Social Connection and Isolation Panel [NHANES]    Frequency of Communication with Friends and Family: More than three times a  week    Frequency of Social Gatherings with Friends and Family: Once a week    Attends Religious Services: More than 4 times per year    Active Member of [employer] or Organizations: No    Attends Engineer, structural: Never    Marital Status: Not on file  Intimate Partner Violence: Not on file    Family History  Problem Relation Age of Onset   Parkinson's disease Mother    Heart disease Father    Bladder Cancer Father    Lung cancer Father    Heart disease Maternal Grandfather      Current Outpatient Medications:    acetaminophen (TYLENOL) 500 MG tablet, Take 500 mg by mouth daily as needed for moderate pain or headache. , Disp: , Rfl:    amLODipine (NORVASC) 10 MG tablet, Take 10 mg by mouth daily., Disp: , Rfl:    atorvastatin (LIPITOR) 20 MG tablet, Take 20 mg by mouth daily., Disp: , Rfl:    atorvastatin (LIPITOR) 40 MG tablet, Take 40 mg by mouth at bedtime., Disp: , Rfl:     Calcium Carb-Cholecalciferol (CALCIUM 600/VITAMIN D3 PO), Take 1 tablet by mouth daily., Disp: , Rfl:    clopidogrel (PLAVIX) 75 MG tablet, Take 1 tablet (75 mg total) by mouth daily., Disp: 90 tablet, Rfl: 3   ELIQUIS 5 MG TABS tablet, Take 1 tablet by mouth twice daily, Disp: 60 tablet, Rfl: 0   letrozole (FEMARA) 2.5 MG tablet, Take 1 tablet (2.5 mg total) by mouth daily., Disp: 90 tablet, Rfl: 5   lidocaine-prilocaine (EMLA) cream, Apply 1 application. topically as needed (port access)., Disp: 30 g, Rfl: 2   losartan (COZAAR) 50 MG tablet, Take 1 tablet by mouth once a day  for high blood pressure- to replace 100 mg, Disp: , Rfl:    metoprolol succinate (TOPROL-XL) 25 MG 24 hr tablet, Take 1 tablet by mouth once daily, Disp: 90 tablet, Rfl: 2   nitroGLYCERIN (NITROSTAT) 0.4 MG SL tablet, Place 1 tablet (0.4 mg total) under the tongue every 5 (five) minutes x 3 doses as needed for chest pain., Disp: 30 tablet, Rfl: 0   palbociclib (IBRANCE) 75 MG tablet, Take 1 tablet (75 mg total) by mouth daily. Take for 21 days on, 7 days off, repeat every 28 days., Disp: 21 tablet, Rfl: 3   isosorbide mononitrate (IMDUR) 30 MG 24 hr tablet, Take 1 tablet by mouth once daily (Patient not taking: Reported on 10/20/2022), Disp: 90 tablet, Rfl: 0  Current Facility-Administered Medications:    sodium chloride flush (NS) 0.9 % injection 3 mL, 3 mL, Intravenous, Q12H, Marykay Lex, MD  Physical exam:  Vitals:   10/20/22 1319 10/20/22 1323  BP:  (!) 123/59  Pulse:  69  Resp:  18  Temp:  98.8 F (37.1 C)  SpO2:  100%  Weight: 196 lb (88.9 kg)   Height: 5\' 2"  (1.575 m)    Physical Exam Cardiovascular:     Rate and Rhythm: Normal rate and regular rhythm.     Heart sounds: Normal heart sounds.  Pulmonary:     Effort: Pulmonary effort is normal.     Breath sounds: Normal breath sounds.  Abdominal:     General: Bowel sounds are normal.     Palpations: Abdomen is soft.  Skin:    General: Skin is  warm and dry.  Neurological:     Mental Status: She is alert and oriented to person, place, and time.  Latest Ref Rng & Units 10/20/2022    1:07 PM  CMP  Glucose 70 - 99 mg/dL 161   BUN 8 - 23 mg/dL 15   Creatinine 0.96 - 1.00 mg/dL 0.45   Sodium 409 - 811 mmol/L 137   Potassium 3.5 - 5.1 mmol/L 3.4   Chloride 98 - 111 mmol/L 104   CO2 22 - 32 mmol/L 23   Calcium 8.9 - 10.3 mg/dL 8.1   Total Protein 6.5 - 8.1 g/dL 6.6   Total Bilirubin 0.3 - 1.2 mg/dL 1.0   Alkaline Phos 38 - 126 U/L 57   AST 15 - 41 U/L 24   ALT 0 - 44 U/L 14       Latest Ref Rng & Units 10/20/2022    1:07 PM  CBC  WBC 4.0 - 10.5 K/uL 2.2   Hemoglobin 12.0 - 15.0 g/dL 91.4   Hematocrit 78.2 - 46.0 % 30.8   Platelets 150 - 400 K/uL 174     No images are attached to the encounter.  NM Bone Scan Whole Body  Result Date: 10/12/2022 CLINICAL DATA:  Breast cancer with bone Mets, recent dental procedure EXAM: NUCLEAR MEDICINE WHOLE BODY BONE SCAN TECHNIQUE: Whole body anterior and posterior images were obtained approximately 3 hours after intravenous injection of radiopharmaceutical. RADIOPHARMACEUTICALS:  21.5 mCi Technetium-1m MDP IV COMPARISON:  10/06/2021 FINDINGS: Increased activity identified in the shoulders, knees ankles and feet consistent with degenerative changes which are stable findings. There is marked uptake in the right maxillary region presumably related the patient's recent periodontal procedure. This is a new finding compared to the prior study. 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 Normal bilateral renal activity was seen. IMPRESSION: 1. Stable diffuse low-level uptake predominantly along the spine consistent with known osteoblastic disease. 2. Periodontal uptake on the right presumed to be related to recent dental procedure. Electronically Signed   By: Layla Maw M.D.   On: 10/12/2022  23:12   CT CHEST ABDOMEN PELVIS W CONTRAST  Result Date: 10/08/2022 CLINICAL DATA:  Left breast cancer with bone metastasis. * Tracking Code: BO * EXAM: CT CHEST, ABDOMEN, AND PELVIS WITH CONTRAST TECHNIQUE: Multidetector CT imaging of the chest, abdomen and pelvis was performed following the standard protocol during bolus administration of intravenous contrast. RADIATION DOSE REDUCTION: This exam was performed according to the departmental dose-optimization program which includes automated exposure control, adjustment of the mA and/or kV according to patient size and/or use of iterative reconstruction technique. CONTRAST:  OMNIPAQUE IOHEXOL 300 MG/ML  SOLN COMPARISON:  03/30/2022 FINDINGS: CT CHEST FINDINGS Cardiovascular: Right Port-A-Cath tip superior caval/atrial junction. Aortic atherosclerosis. Tortuous thoracic aorta. Mild cardiomegaly, without pericardial effusion. Three vessel coronary artery calcification. No central pulmonary embolism, on this non-dedicated study. Mediastinum/Nodes: No supraclavicular adenopathy. No axillary adenopathy. No mediastinal or hilar adenopathy. Tiny hiatal hernia. No internal mammary adenopathy. Lungs/Pleura: No pleural fluid.  No  pulmonary metastasis. Musculoskeletal: Diffuse sclerotic osseous metastasis, similar. CT ABDOMEN PELVIS FINDINGS Hepatobiliary: High left hepatic lobe low-density lesion is similar and likely a cyst. Tiny gallstones without acute cholecystitis or biliary duct dilatation. Pancreas: Normal, without mass or ductal dilatation. Spleen: Normal in size, without focal abnormality. Adrenals/Urinary Tract: Normal adrenal glands. Normal kidneys, without hydronephrosis. Normal urinary bladder. Stomach/Bowel: Normal remainder of the stomach. Extensive colonic diverticulosis. Normal terminal ileum. Normal small bowel. Vascular/Lymphatic: Aortic atherosclerosis. No abdominopelvic adenopathy. Reproductive: Normal uterus and adnexa. Other: No significant  free fluid. No evidence of omental or peritoneal disease. Musculoskeletal: Diffuse sclerotic metastasis are similar to on the prior. IMPRESSION: 1. Similar diffuse sclerotic osseous metastasis. 2. No soft tissue metastasis within the chest, abdomen, or pelvis. 3. Incidental findings, including: Coronary artery atherosclerosis. Aortic Atherosclerosis (ICD10-I70.0). Cholelithiasis. Electronically Signed   By: Jeronimo Greaves M.D.   On: 10/08/2022 11:49   VAS [place] ABI WITH/WO TBI  Result Date: 10/01/2022  LOWER EXTREMITY DOPPLER STUDY Patient Name:  HAZELL COMELLA  Date of Exam:   09/30/2022 Medical Rec #: [id number]       Accession #:    [id number] Date of Birth: 08-03-1940       Patient Gender: F Patient Age:   41 years Exam Location:  [facility] Procedure:      VAS [place] ABI WITH/WO TBI Referring Phys: Festus Barren --------------------------------------------------------------------------------  Indications: Claudication, peripheral artery disease, and Right foot              discoloration. High Risk Factors: Hypertension, no history of smoking, coronary artery                    disease. Other Factors: Right great toe pain and discoloration which has mostly                resolved.  Vascular Interventions: 08/30/17: Right iliac, common femoral, femoral &                         popliteal vein thrombolysis/thrombectomy/PTA. Performing Technologist: Hardie Lora RVT  Examination Guidelines: A complete evaluation includes at minimum, Doppler waveform signals and systolic blood pressure reading at the level of bilateral brachial, anterior tibial, and posterior tibial arteries, when vessel segments are accessible. Bilateral testing is considered an integral part of a complete examination. Photoelectric Plethysmograph (PPG) waveforms and toe systolic pressure readings are included as required and additional duplex testing as needed. Limited examinations for reoccurring indications may be performed as noted.  ABI  Findings: +---------+------------------+-----+---------+--------+ Right    Rt Pressure (mmHg)IndexWaveform Comment  +---------+------------------+-----+---------+--------+ Brachial 173                                      +---------+------------------+-----+---------+--------+ PTA      155               0.90 triphasic         +---------+------------------+-----+---------+--------+ DP       156               0.90 triphasic         +---------+------------------+-----+---------+--------+ Great Toe101               0.58                   +---------+------------------+-----+---------+--------+ +---------+------------------+-----+---------+-------+ Left     Lt Pressure (mmHg)IndexWaveform Comment +---------+------------------+-----+---------+-------+ Brachial 171                                     +---------+------------------+-----+---------+-------+ PTA      131               0.76 triphasic        +---------+------------------+-----+---------+-------+ DP       128  0.74 triphasic        +---------+------------------+-----+---------+-------+ Great Toe79                0.46                  +---------+------------------+-----+---------+-------+ +-------+-----------+-----------+------------+------------+ ABI/TBIToday's ABIToday's TBIPrevious ABIPrevious TBI +-------+-----------+-----------+------------+------------+ Right  0.98       0.58       0.92        0.60         +-------+-----------+-----------+------------+------------+ Left   0.76       0.46       0.82        0.53         +-------+-----------+-----------+------------+------------+  *See table(s) above for measurements and observations.  Electronically signed by Festus Barren MD on 10/01/2022 at 3:19:25 PM.    Final      Assessment and plan- Patient is a 81 y.o. female with history of ER positive metastatic breast cancer with bone metastases currently on Ibrance and letrozole  here for routine follow-up  I have reviewed bone scan and CT images independently and discussed findings with the patient which shows overall stable bone mets and no evidence of recurrent or progressive disease.  She will continue taking Ibrance and letrozole until progression or toxicity and she has been on this regimen since September 2018 which is remarkable.  Patient reports some hardness along the left lateral breast but on exam as well as CT scan there were no concerning findings.  Continue to monitor  History of right lower extremity DVT: Continue Eliquis  Drug-induced neutropenia secondary to Ibrance.  ANC remains more than 1 and she will continue Ibrance at 75 mg 3 weeks on and 1 week off.  I will see her back in 3 months with labs   Visit Diagnosis 1. Malignant neoplasm metastatic to bone (HCC)   2. High risk medication use   3. Use of letrozole (Femara)   4. Drug-induced neutropenia (HCC)      Dr. Owens Shark, MD, MPH [facility] [phone] 10/20/2022 3:55 PM

## 2022-10-21 ENCOUNTER — Other Ambulatory Visit: Payer: Self-pay | Admitting: *Deleted

## 2022-10-21 MED ORDER — ONDANSETRON HCL 4 MG PO TABS: 4.0000 mg | ORAL_TABLET | Freq: Three times a day (TID) | ORAL | 0 refills | Status: DC | PRN

## 2022-10-22 ENCOUNTER — Other Ambulatory Visit (HOSPITAL_COMMUNITY): Payer: Self-pay

## 2022-10-22 ENCOUNTER — Other Ambulatory Visit: Payer: Self-pay

## 2022-10-22 ENCOUNTER — Other Ambulatory Visit: Payer: Self-pay | Admitting: Oncology

## 2022-10-22 DIAGNOSIS — C50912 Malignant neoplasm of unspecified site of left female breast: Secondary | ICD-10-CM

## 2022-10-22 MED ORDER — PALBOCICLIB 75 MG PO TABS
75.0000 mg | ORAL_TABLET | Freq: Every day | ORAL | 3 refills | Status: DC
Start: 2022-10-22 — End: 2023-02-09
  Filled 2022-10-22: qty 21, 28d supply, fill #0
  Filled 2022-11-18: qty 21, 28d supply, fill #1
  Filled 2022-12-15: qty 21, 28d supply, fill #2
  Filled 2023-01-12: qty 21, 28d supply, fill #3

## 2022-10-22 NOTE — Telephone Encounter (Signed)
CBC with Differential/Platelet Order: [id number] Status: Final result     Visible to patient: Yes (seen)     Next appt: 12/03/2022 at 09:20 AM in Cardiology Bryan Lemma, MD)     Dx: Drug-induced neutropenia [facility]); Malig...   0 Result Notes          Component Ref Range & Units 2 d ago (10/20/22) 3 mo ago (07/14/22) 6 mo ago (04/14/22) 9 mo ago (01/12/22) 1 yr ago (10/17/21) 1 yr ago (10/16/21) 1 yr ago (10/14/21)  WBC 4.0 - 10.5 K/uL 2.2 Low  2.7 Low  3.4 Low  2.5 Low  2.8 Low  2.8 Low  3.3 Low   RBC 3.87 - 5.11 MIL/uL 2.85 Low  2.88 Low  3.03 Low  3.24 Low  2.96 Low  3.02 Low  3.31 Low   Hemoglobin 12.0 - 15.0 g/dL 40.9 Low  81.1 Low  91.4 Low  12.2 11.0 Low  11.0 Low  12.1  HCT 36.0 - 46.0 % 30.8 Low  30.8 Low  32.2 Low  34.4 Low  31.8 Low  32.1 Low  35.3 Low   MCV 80.0 - 100.0 fL 108.1 High  106.9 High  106.3 High  106.2 High  107.4 High  106.3 High  106.6 High   MCH 26.0 - 34.0 pg 37.9 High  37.8 High  37.6 High  37.7 High  37.2 High  36.4 High  36.6 High   MCHC 30.0 - 36.0 g/dL 78.2 95.6 21.3 08.6 57.8 34.3 34.3  RDW 11.5 - 15.5 % 14.8 15.3 15.3 14.6 15.4 15.4 15.3  Platelets 150 - 400 K/uL 174 145 Low  198 293 227 235 209  nRBC 0.0 - 0.2 % 0.0 0.0 0.0 0.0 0.0 CM 0.0 CM 0.0  Neutrophils Relative % % 54 57 62 48   58  Neutro Abs 1.7 - 7.7 K/uL 1.2 Low  1.6 Low  2.2 1.2 Low    1.9  Lymphocytes Relative % 37 33 28 41   30  Lymphs Abs 0.7 - 4.0 K/uL 0.8 0.9 1.0 1.0   1.0  Monocytes Relative % 6 7 6 7   7   Monocytes Absolute 0.1 - 1.0 K/uL 0.1 0.2 0.2 0.2   0.2  Eosinophils Relative % 1 1 2 2   3   Eosinophils Absolute 0.0 - 0.5 K/uL 0.0 0.0 0.1 0.1   0.1  Basophils Relative % 1 2 1 2   2   Basophils Absolute 0.0 - 0.1 K/uL 0.0 0.0 0.0 0.1   0.1  Immature Granulocytes % 1 0 1 0   0  Abs Immature Granulocytes 0.00 - 0.07 K/uL 0.01 0.00 CM 0.02 CM 0.00 CM   0.01 CM  Comment: Performed at [facility], [address] [place], [place] [address]  WBC Morphology    DIFF CONFIRMED BY MANUAL. DIFF. CONFIRMED BY SMEAR     RBC Morphology   UNREMARKABLE MORPHOLOGY UNREMARKABLE     Smear Review   Normal platelet morphology CM Normal platelet morphology CM     Resulting Agency [facility]         Specimen Collected: 10/20/22 13:07 Last Resulted: 10/20/22 13:39      Lab Flowsheet      Order Details      View Encounter      Lab and Collection Details      Routing  Result History    View All Conversations on this Encounter      CM=Additional comments      Result Care Coordination   Patient Communication   Add Comments   Seen Back to Top    Other Results from 10/20/2022   Contains abnormal data Comprehensive metabolic panel Order: [id number] Status: Final result      Visible to patient: Yes (not seen)      Next appt: 12/03/2022 at 09:20 AM in Cardiology Bryan Lemma, MD)      Dx: Drug[facility]induced neutropenia [facility] Malig...    0 Result Notes            Component Ref Range & Units 2 d ago (10/20/22) 2 wk ago (10/07/22) 3 mo ago (07/14/22) 6 mo ago (04/14/22) 6 mo ago (03/30/22) 9 mo ago (01/12/22) 1 yr ago (10/17/21)  Sodium 135 - 145 mmol/L 137  132 Low  138  137 138  Potassium 3.5 - 5.1 mmol/L 3.4 Low   3.6 3.9  3.6 3.3 Low   Chloride 98 - 111 mmol/L 104  102 106  107 109  CO2 22 - 32 mmol/L 23  21 Low  22  26 23   Glucose, Bld 70 - 99 mg/dL 601 High   093 High  CM 169 High  CM  177 High  CM 119 High  CM  Comment: Glucose reference range applies only to samples taken after fasting for at least 8 hours.  BUN 8 - 23 mg/dL 15  14 15  13 11   Creatinine, Ser 0.44 - 1.00 mg/dL 2.35 5.73 High  2.20 High  1.17 High  0.60 1.12 High  1.15 High   Calcium 8.9 - 10.3 mg/dL 8.1 Low   9.2 9.3  9.4 8.5 Low   Total Protein 6.5 - 8.1 g/dL 6.6  7.1 7.4  7.1   Albumin 3.5 - 5.0 g/dL 3.4 Low   3.7 3.5  3.7   AST 15 - 41 U/L 24  30 29   34   ALT 0 - 44 U/L 14  16 15   23    Alkaline Phosphatase 38 - 126 U/L 57  61 62  63   Total Bilirubin 0.3 - 1.2 mg/dL 1.0  0.9 1.0  1.0   GFR, Estimated >60 mL/min 58 Low    47 Low  CM  50 Low  CM 49 Low  CM  Comment: (NOTE) Calculated using the CKD-EPI Creatinine Equation (2021)  Anion gap 5 - 15 10  9  CM 10 CM  4 Low  CM 6 CM  Comment: Performed at [facility], [address] [place], [place] [address]  Resulting Agency [facility]         Specimen Collected: 10/20/22 13:07 Last Resulted: 10/20/22 13:37

## 2022-10-23 ENCOUNTER — Other Ambulatory Visit (HOSPITAL_COMMUNITY): Payer: Self-pay

## 2022-11-18 ENCOUNTER — Other Ambulatory Visit (HOSPITAL_COMMUNITY): Payer: Self-pay

## 2022-11-19 ENCOUNTER — Other Ambulatory Visit: Payer: Self-pay | Admitting: Oncology

## 2022-11-20 ENCOUNTER — Other Ambulatory Visit (HOSPITAL_COMMUNITY): Payer: Self-pay

## 2022-12-03 ENCOUNTER — Ambulatory Visit: Payer: Medicare Other | Attending: Cardiology | Admitting: Cardiology

## 2022-12-03 ENCOUNTER — Ambulatory Visit: Payer: Medicare Other | Admitting: Cardiology

## 2022-12-03 ENCOUNTER — Encounter: Payer: Self-pay | Admitting: Cardiology

## 2022-12-03 VITALS — BP 138/58 | HR 56 | Ht 62.0 in | Wt 190.0 lb

## 2022-12-03 DIAGNOSIS — I214 Non-ST elevation (NSTEMI) myocardial infarction: Secondary | ICD-10-CM

## 2022-12-03 DIAGNOSIS — I82511 Chronic embolism and thrombosis of right femoral vein: Secondary | ICD-10-CM

## 2022-12-03 DIAGNOSIS — I25110 Atherosclerotic heart disease of native coronary artery with unstable angina pectoris: Secondary | ICD-10-CM | POA: Diagnosis not present

## 2022-12-03 DIAGNOSIS — Z7901 Long term (current) use of anticoagulants: Secondary | ICD-10-CM

## 2022-12-03 DIAGNOSIS — I739 Peripheral vascular disease, unspecified: Secondary | ICD-10-CM

## 2022-12-03 DIAGNOSIS — I10 Essential (primary) hypertension: Secondary | ICD-10-CM | POA: Diagnosis not present

## 2022-12-03 DIAGNOSIS — E785 Hyperlipidemia, unspecified: Secondary | ICD-10-CM

## 2022-12-03 NOTE — Progress Notes (Signed)
Cardiology Office Note:  .   Date:  12/19/2022  ID:  Victoria Steele, DOB 1940-12-11, MRN [id number] PCP: Oswaldo Conroy, MD  [facility] Providers Cardiologist:  Bryan Lemma, MD     Chief Complaint  Patient presents with   Follow-up    1 year f/u, pt feels well today, meds reviewed   Coronary Artery Disease    No active angina    History of Present Illness: .     Victoria Steele is a very pleasant 82 y.o. female with a PMH noted belowwho presents here for annual f/u of CAD at the request of Bender, Earl Lagos, MD.  PMH Notable for PAD, HTN, Metastatic Breast Cancer, prior PE (history of thrombectomy; on Xarelto) with recent diagnosis of CAD on Coronary CTA artery presented with non-STEMI prior to planned cardiac cath-found to have two-vessel disease treated with PCI x 2.   Victoria Steele was last seen on November 13, 2021 as a posthospital follow-up from her non-STEMI/PCI.  No further angina.  Some musculoskeletal chest wall pain in the left upper chest and right upper chest thought related to bony metastases.  Also noted some hair loss.  Otherwise doing well from a cardiac standpoint.  No melena, hematochezia, hematuria epistaxis.  Energy level also improved.  Still some mild baseline dyspnea but no significant exertional dyspnea.  No PND, orthopnea or edema. Was treated with 1 month DAPT plus Eliquis then 1 complete year of Plavix plus Eliquis.  After that we will plan to switch to Eliquis alone.  She appropriately stopped her Plavix in July. Continued on Imdur, Toprol and losartan.  Consider restarting HCTZ if blood pressure elevated.    Subjective  INTERVAL HISTORY Victoria Steele returns today for annual f/u in good spirits & no major complaints.  Shortly after last visit used NTG a few times, but no use in > 9 months.  Still gets SOB walking around, but able to rest & keep going- no real change.  Chornic ankle & foot swelling - goes down with elevation.   Activity  more limited by muscle fatigue & aching.   Cardiovascular ROS: positive for - dyspnea on exertion and edema negative for - chest pain, irregular heartbeat, orthopnea, palpitations, paroxysmal nocturnal dyspnea, rapid heart rate, shortness of breath, or syncope/near syncope, TIA/amaurosis fugax, claudication  ROS:   Review of Systems - Negative except Sx noted below. Constitutional:  Positive for malaise/fatigue (Fatigue is better. - still tired , but normal for her.. Negative for weight loss.  HENT:  Positive for hearing loss. Negative for nosebleeds.        Hair loss - thin hair. Respiratory:  Positive for cough. Negative for sputum production and shortness of breath (Notable improvement).   Cardiovascular:  Negative for chest pain, claudication and PND.  Gastrointestinal:  Negative for blood in stool and melena.  Genitourinary:  Negative for dysuria, frequency and hematuria.  Musculoskeletal:  Positive for neck pain, joint pain (shoulder) and myalgias. Negative for falls. Chest wall pain less prominent.  Neurological: . Negative for dizziness, focal weakness and weakness.  Psychiatric/Behavioral:  Negative for depression and memory loss. The patient does not have insomnia.    Objective   Studies Reviewed: Marland Kitchen       Past CV Procedures History:  Procedure Laterality Date   LEFT HEART CATH AND CORONARY ANGIOGRAPHY  10/16/2021   Procedure: LEFT HEART CATH AND CORONARY ANGIOGRAPHY;  Surgeon: Marykay Lex, MD;  Location: [facility] INVASIVE CV  LAB:: mRCA 100% CTO (L-R  & bridging collaterals); prox LAD 75%( DES PCI) - dLAD-rDPDA colleterals; mLCx 75% just proximal to the OM 2 (DES PCI).  Normal EDP.     CORONARY STENT INTERVENTION N/A 10/16/2021   Procedure: CORONARY STENT INTERVENTION;  Surgeon: Marykay Lex, MD;  Location: [facility] INVASIVE CV LAB: Prox LAD 75%->0% Onyx Frontier DES 3 x 15 (3.3); Mid LCx 75% (prox to OM2) -> Onyx Frontier DES 2.5 x 18 -> tapered 2.8 to 2.6 mm         CT CTA  CORONARY W/CA SCORE W/CM &/OR WO/CM  09/01/2021   Coronary Calcium Score 788.  Combination of calcified and noncalcified plaque-severe lesion in mid RCA (estimated greater than 70%).  Moderate LAD and LCx 50 to 69%). => CAD RADS 4 with severe stenosis suggested in the RCA.  FFRCT not submitted due to motion artifact.-Recommended cardiac catheterization.        TRANSTHORACIC ECHOCARDIOGRAM  10/01/2021   EF 60 to 65%.  No RWMA.  GR 1 DD.  Normal RV.  Normal AoV and MV.          IVC FILTER REMOVAL N/A 01/17/2018   Procedure: IVC FILTER REMOVAL;  Surgeon: Annice Needy, MD;  Location: [facility] INVASIVE CV LAB;  Service: Cardiovascular;  Laterality: N/A;    NA       LOWER EXTREMITY ANGIOGRAPHY Right 11/11/2017   Procedure: LOWER EXTREMITY ANGIOGRAPHY;  Surgeon: Renford Dills, MD;  Location: [facility] INVASIVE CV LAB;  Service: Cardiovascular;  Laterality: Right;   PERIPHERAL VASCULAR THROMBECTOMY Right 08/30/2017   Procedure: PERIPHERAL VASCULAR THROMBECTOMY;  Surgeon: Annice Needy, MD;  Location: [facility] INVASIVE CV LAB;  Service: Cardiovascular;  Laterality: Right;   PORTACATH PLACEMENT Right 09/10/2016   Procedure: INSERTION PORT-A-CATH;  Surgeon: Leafy Ro, MD;  Location: [facility] ORS;  Service: General;  Laterality: Right;    Risk Assessment/Calculations:      Physical Exam:   VS:  BP (!) 138/58   Pulse (!) 56   Ht 5\' 2"  (1.575 m)   Wt 190 lb (86.2 kg)   SpO2 95%   BMI 34.75 kg/m    Wt Readings from Last 3 Encounters:  12/03/22 190 lb (86.2 kg)  10/20/22 196 lb (88.9 kg)  09/30/22 195 lb (88.5 kg)    GEN: Well nourished, well developed in no acute distress; obese NECK: No JVD; No carotid bruits CARDIAC: Normal S1, S2; RRR (occasional ectopy), no murmurs, rubs, gallops; costosternal tenderness. RESPIRATORY:  Clear to auscultation without rales, wheezing or rhonchi ; nonlabored, good air movement. ABDOMEN: Soft, non-tender, non-distended EXTREMITIES: Mild puffy swelling; No deformity ;  borderline unsteady gait.  Lab Results  Component Value Date   CHOL 150 12/03/2022   HDL 53 12/03/2022   LDLCALC 70 12/03/2022   TRIG 161 (H) 12/03/2022   CHOLHDL 2.8 12/03/2022   Lab Results  Component Value Date   NA 137 10/20/2022   CL 104 10/20/2022   K 3.4 (L) 10/20/2022   CO2 23 10/20/2022   BUN 15 10/20/2022   CREATININE 0.99 10/20/2022   GFRNONAA 58 (L) 10/20/2022   CALCIUM 8.1 (L) 10/20/2022   ALBUMIN 3.9 12/03/2022   GLUCOSE 182 (H) 10/20/2022      ASSESSMENT AND PLAN: .    Problem List Items Addressed This Visit       Cardiology Problems   Coronary artery disease involving native coronary artery of native heart with unstable angina pectoris (HCC) - Primary (Chronic)  Initially noted to have CAD with abnormal coronary CTA.  Unfortunately, she suffered a non-STEMI while waiting for her heart catheterization.  Urgent catheterization showed severe LAD and circumflex disease with a CTO of the RCA having left leg collaterals.  Both the LAD and circumflex were treated with PCI and medical management of the RCA CTO with collaterals.  She is now active and has no complaints of any angina.  She is no longer on antiplatelet agent having stopped her Plavix 1 year out since she is on Eliquis long-term.  Plan:  Continue Lipitor-she had decreased her dose to 20 mg.  (Need to reassess lipids. Continue low-dose Toprol-cannot titrate further because of heart rate of 56. Continue amlodipine for BP and antianginal benefit with collaterals. Continue losartan at 50 mg daily.      Relevant Orders   EKG 12-Lead (Completed)   Lipid panel (Completed)   Hepatic function panel (Completed)   DVT (deep venous thrombosis) (HCC) (Chronic)    Has had a longstanding history of chronic DVTs and is therefore on long-term anticoagulation. With long-term Eliquis, no longer on antiplatelet agent.  Recommendation as to holding antiplatelet agent would be per heme-onc provider and not from this  office.      Essential hypertension (Chronic)    Borderline blood pressure today.  Her losartan dose had been reduced to 100 mg to 50 mg.  Continue to monitor.  She says her pressures are better at home..      NSTEMI (non-ST elevated myocardial infarction) (HCC) (Chronic)    Now just over a year from her non-STEMI.  Has been was fully recovered and back to her baseline level activity.  No active angina or heart failure symptoms.  Seems to doing well despite having CTO of the RCA.      PAD (peripheral artery disease) (HCC) (Chronic)    Followed by Dr. Wyn Quaker - no active claudication        Other   Chronic anticoagulation (Chronic)    Stopped Plavix in July - now on DOAC monotherapy -- per Heme-Onc  Defer to Heme-Onc re: holding for procedures.       Other Visit Diagnoses     Hyperlipidemia LDL goal <70       Relevant Orders   Lipid panel (Completed)   Hepatic function panel (Completed)           Dispo: No follow-ups on file.  Total time spent: 21 min spent with patient + 14 min spent charting = 35 min     Signed, Marykay Lex, MD, MS Bryan Lemma, M.D., M.S. Interventional Cardiologist  [facility]  Pager # [phone] Phone # [phone] [address] [place], [place] [address]

## 2022-12-03 NOTE — Assessment & Plan Note (Signed)
Followed by Dr. Wyn Quaker - no active claudication

## 2022-12-03 NOTE — Patient Instructions (Addendum)
BP see if you can follow-up at the [facility] and was playing medication Instructions:  Your physician recommends that you continue on your current medications as directed. Please refer to the Current Medication list given to you today.  *If you need a refill on your cardiac medications before your next appointment, please call your pharmacy*   Lab Work: Your provider would like for you to have following labs drawn today Lipid panel, Liver function test.   If you have labs (blood work) drawn today and your tests are completely normal, you will receive your results only by: MyChart Message (if you have MyChart) OR A paper copy in the mail If you have any lab test that is abnormal or we need to change your treatment, we will call you to review the results.   Testing/Procedures: none   Follow-Up: At [facility], you and your health needs are our priority.  As part of our continuing mission to provide you with exceptional heart care, we have created designated Provider Care Teams.  These Care Teams include your primary Cardiologist (physician) and Advanced Practice Providers (APPs -  Physician Assistants and Nurse Practitioners) who all work together to provide you with the care you need, when you need it.  We recommend signing up for the patient portal called "MyChart".  Sign up information is provided on this After Visit Summary.  MyChart is used to connect with patients for Virtual Visits (Telemedicine).  Patients are able to view lab/test results, encounter notes, upcoming appointments, etc.  Non-urgent messages can be sent to your provider as well.   To learn more about what you can do with MyChart, go to [email or web address].    Your next appointment:   1 year(s)  Provider:   You may see Bryan Lemma, MD or one of the following Advanced Practice Providers on your designated Care Team:   Nicolasa Ducking, NP Eula Listen, PA-C Cadence Fransico Michael, PA-C Charlsie Quest, NP

## 2022-12-03 NOTE — Assessment & Plan Note (Signed)
Stopped Plavix in July - now on DOAC monotherapy -- per Heme-Onc  Defer to Heme-Onc re: holding for procedures.

## 2022-12-04 ENCOUNTER — Other Ambulatory Visit: Payer: Self-pay | Admitting: Oncology

## 2022-12-04 DIAGNOSIS — C7951 Secondary malignant neoplasm of bone: Secondary | ICD-10-CM

## 2022-12-04 LAB — HEPATIC FUNCTION PANEL
ALT: 15 IU/L (ref 0–32)
AST: 23 IU/L (ref 0–40)
Albumin: 3.9 g/dL (ref 3.8–4.8)
Alkaline Phosphatase: 85 IU/L (ref 44–121)
Bilirubin Total: 1.2 mg/dL (ref 0.0–1.2)
Bilirubin, Direct: 0.28 mg/dL (ref 0.00–0.40)
Total Protein: 6.7 g/dL (ref 6.0–8.5)

## 2022-12-04 LAB — LIPID PANEL
Chol/HDL Ratio: 2.8 ratio (ref 0.0–4.4)
Cholesterol, Total: 150 mg/dL (ref 100–199)
HDL: 53 mg/dL (ref >39)
LDL Chol Calc (NIH): 70 mg/dL (ref 0–99)
Triglycerides: 161 mg/dL — ABNORMAL HIGH (ref 0–149)
VLDL Cholesterol Cal: 27 mg/dL (ref 5–40)

## 2022-12-06 ENCOUNTER — Encounter: Payer: Self-pay | Admitting: Oncology

## 2022-12-07 ENCOUNTER — Telehealth: Payer: Self-pay | Admitting: *Deleted

## 2022-12-07 NOTE — Telephone Encounter (Signed)
Pt called to ask for letrzole, I sent it in on Sunday so she should be able to pick it up at her pharmacy. Pt will go get it

## 2022-12-07 NOTE — Telephone Encounter (Signed)
Pt called this am and I told her that I sent the letrozole on Sunday. She is ok and will get it

## 2022-12-15 ENCOUNTER — Other Ambulatory Visit (HOSPITAL_COMMUNITY): Payer: Self-pay

## 2022-12-19 ENCOUNTER — Encounter: Payer: Self-pay | Admitting: Cardiology

## 2022-12-19 NOTE — Assessment & Plan Note (Signed)
Initially noted to have CAD with abnormal coronary CTA.  Unfortunately, she suffered a non-STEMI while waiting for her heart catheterization.  Urgent catheterization showed severe LAD and circumflex disease with a CTO of the RCA having left leg collaterals.  Both the LAD and circumflex were treated with PCI and medical management of the RCA CTO with collaterals.  She is now active and has no complaints of any angina.  She is no longer on antiplatelet agent having stopped her Plavix 1 year out since she is on Eliquis long-term.  Plan:  Continue Lipitor-she had decreased her dose to 20 mg.  (Need to reassess lipids. Continue low-dose Toprol-cannot titrate further because of heart rate of 56. Continue amlodipine for BP and antianginal benefit with collaterals. Continue losartan at 50 mg daily.

## 2022-12-19 NOTE — Assessment & Plan Note (Signed)
Borderline blood pressure today.  Her losartan dose had been reduced to 100 mg to 50 mg.  Continue to monitor.  She says her pressures are better at home.Marland Kitchen

## 2022-12-19 NOTE — Assessment & Plan Note (Signed)
Now just over a year from her non-STEMI.  Has been was fully recovered and back to her baseline level activity.  No active angina or heart failure symptoms.  Seems to doing well despite having CTO of the RCA.

## 2022-12-19 NOTE — Assessment & Plan Note (Addendum)
Has had a longstanding history of chronic DVTs and is therefore on long-term anticoagulation. With long-term Eliquis, no longer on antiplatelet agent.  Recommendation as to holding antiplatelet agent would be per heme-onc provider and not from this office.

## 2022-12-21 ENCOUNTER — Other Ambulatory Visit: Payer: Self-pay

## 2022-12-21 DIAGNOSIS — Z79899 Other long term (current) drug therapy: Secondary | ICD-10-CM

## 2022-12-23 ENCOUNTER — Other Ambulatory Visit (HOSPITAL_COMMUNITY): Payer: Self-pay

## 2023-01-12 ENCOUNTER — Other Ambulatory Visit: Payer: Self-pay | Admitting: Pharmacy Technician

## 2023-01-12 ENCOUNTER — Other Ambulatory Visit (HOSPITAL_COMMUNITY): Payer: Self-pay

## 2023-01-12 NOTE — Progress Notes (Signed)
Specialty Pharmacy Refill Coordination Note  Victoria Steele is a 82 y.o. female contacted today regarding refills of specialty medication(s) Palbociclib .  Patient requested Delivery  on 01/19/23  to verified address [address] [place], [place]   Medication will be filled on 01/18/23.   New cycle start on 01/23/23

## 2023-01-14 ENCOUNTER — Other Ambulatory Visit: Payer: Self-pay | Admitting: Cardiology

## 2023-01-17 NOTE — Telephone Encounter (Signed)
Yes.  I did want her to still be on this.  I had not realized that it had fallen off of her list.  Bryan Lemma, MD

## 2023-01-19 NOTE — Telephone Encounter (Signed)
Refill of Imdur 30 mg e-sent to pharamcy

## 2023-01-20 ENCOUNTER — Inpatient Hospital Stay: Payer: Medicare Other | Admitting: Oncology

## 2023-01-20 ENCOUNTER — Inpatient Hospital Stay: Payer: Medicare Other | Attending: Oncology

## 2023-01-20 ENCOUNTER — Other Ambulatory Visit: Payer: Self-pay

## 2023-01-20 ENCOUNTER — Encounter: Payer: Self-pay | Admitting: Oncology

## 2023-01-20 VITALS — BP 149/53 | HR 64 | Temp 98.2°F | Resp 18 | Ht 62.0 in | Wt 192.4 lb

## 2023-01-20 DIAGNOSIS — Z170 Estrogen receptor positive status [ER+]: Secondary | ICD-10-CM | POA: Insufficient documentation

## 2023-01-20 DIAGNOSIS — C50912 Malignant neoplasm of unspecified site of left female breast: Secondary | ICD-10-CM | POA: Insufficient documentation

## 2023-01-20 DIAGNOSIS — Z79811 Long term (current) use of aromatase inhibitors: Secondary | ICD-10-CM | POA: Insufficient documentation

## 2023-01-20 DIAGNOSIS — C7951 Secondary malignant neoplasm of bone: Secondary | ICD-10-CM | POA: Insufficient documentation

## 2023-01-20 DIAGNOSIS — D702 Other drug-induced agranulocytosis: Secondary | ICD-10-CM | POA: Diagnosis not present

## 2023-01-20 DIAGNOSIS — Z79899 Other long term (current) drug therapy: Secondary | ICD-10-CM | POA: Diagnosis not present

## 2023-01-20 DIAGNOSIS — Z95828 Presence of other vascular implants and grafts: Secondary | ICD-10-CM

## 2023-01-20 LAB — CBC WITH DIFFERENTIAL/PLATELET
Abs Immature Granulocytes: 0.01 10*3/uL (ref 0.00–0.07)
Basophils Absolute: 0.1 10*3/uL (ref 0.0–0.1)
Basophils Relative: 2 %
Eosinophils Absolute: 0 10*3/uL (ref 0.0–0.5)
Eosinophils Relative: 2 %
HCT: 35.6 % — ABNORMAL LOW (ref 36.0–46.0)
Hemoglobin: 12.5 g/dL (ref 12.0–15.0)
Immature Granulocytes: 0 %
Lymphocytes Relative: 32 %
Lymphs Abs: 0.8 10*3/uL (ref 0.7–4.0)
MCH: 37.7 pg — ABNORMAL HIGH (ref 26.0–34.0)
MCHC: 35.1 g/dL (ref 30.0–36.0)
MCV: 107.2 fL — ABNORMAL HIGH (ref 80.0–100.0)
Monocytes Absolute: 0.2 10*3/uL (ref 0.1–1.0)
Monocytes Relative: 8 %
Neutro Abs: 1.4 10*3/uL — ABNORMAL LOW (ref 1.7–7.7)
Neutrophils Relative %: 56 %
Platelets: 168 10*3/uL (ref 150–400)
RBC: 3.32 MIL/uL — ABNORMAL LOW (ref 3.87–5.11)
RDW: 15.4 % (ref 11.5–15.5)
WBC: 2.4 10*3/uL — ABNORMAL LOW (ref 4.0–10.5)
nRBC: 0 % (ref 0.0–0.2)

## 2023-01-20 LAB — CMP (CANCER CENTER ONLY)
ALT: 16 U/L (ref 0–44)
AST: 26 U/L (ref 15–41)
Albumin: 3.6 g/dL (ref 3.5–5.0)
Alkaline Phosphatase: 64 U/L (ref 38–126)
Anion gap: 8 (ref 5–15)
BUN: 13 mg/dL (ref 8–23)
CO2: 22 mmol/L (ref 22–32)
Calcium: 9.2 mg/dL (ref 8.9–10.3)
Chloride: 106 mmol/L (ref 98–111)
Creatinine: 1 mg/dL (ref 0.44–1.00)
GFR, Estimated: 57 mL/min — ABNORMAL LOW (ref >60)
Glucose, Bld: 205 mg/dL — ABNORMAL HIGH (ref 70–99)
Potassium: 3.5 mmol/L (ref 3.5–5.1)
Sodium: 136 mmol/L (ref 135–145)
Total Bilirubin: 1.1 mg/dL (ref 0.3–1.2)
Total Protein: 7.1 g/dL (ref 6.5–8.1)

## 2023-01-20 LAB — CBC WITH DIFFERENTIAL (CANCER CENTER ONLY)
Abs Immature Granulocytes: 0.01 10*3/uL (ref 0.00–0.07)
Basophils Absolute: 0 10*3/uL (ref 0.0–0.1)
Basophils Relative: 1 %
Eosinophils Absolute: 0 10*3/uL (ref 0.0–0.5)
Eosinophils Relative: 2 %
HCT: 19.9 % — ABNORMAL LOW (ref 36.0–46.0)
Hemoglobin: 6.9 g/dL — CL (ref 12.0–15.0)
Immature Granulocytes: 1 %
Lymphocytes Relative: 37 %
Lymphs Abs: 0.6 10*3/uL — ABNORMAL LOW (ref 0.7–4.0)
MCH: 38.3 pg — ABNORMAL HIGH (ref 26.0–34.0)
MCHC: 34.7 g/dL (ref 30.0–36.0)
MCV: 110.6 fL — ABNORMAL HIGH (ref 80.0–100.0)
Monocytes Absolute: 0.1 10*3/uL (ref 0.1–1.0)
Monocytes Relative: 8 %
Neutro Abs: 0.8 10*3/uL — ABNORMAL LOW (ref 1.7–7.7)
Neutrophils Relative %: 51 %
Platelet Count: 97 10*3/uL — ABNORMAL LOW (ref 150–400)
RBC: 1.8 MIL/uL — ABNORMAL LOW (ref 3.87–5.11)
RDW: 15.5 % (ref 11.5–15.5)
Smear Review: NORMAL
WBC Count: 1.5 10*3/uL — ABNORMAL LOW (ref 4.0–10.5)
nRBC: 0 % (ref 0.0–0.2)

## 2023-01-20 MED ORDER — HEPARIN SOD (PORK) LOCK FLUSH 100 UNIT/ML IV SOLN
500.0000 [IU] | Freq: Once | INTRAVENOUS | Status: AC
  Administered 2023-01-20: 500 [IU] via INTRAVENOUS
  Filled 2023-01-20: qty 5

## 2023-01-20 MED ORDER — SODIUM CHLORIDE 0.9% FLUSH
10.0000 mL | Freq: Once | INTRAVENOUS | Status: AC
  Administered 2023-01-20: 10 mL via INTRAVENOUS
  Filled 2023-01-20: qty 10

## 2023-01-21 ENCOUNTER — Encounter: Payer: Self-pay | Admitting: Oncology

## 2023-01-21 NOTE — Progress Notes (Signed)
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  Patient has been on Ibrance and letrozole since then.  She had cytopenias with [name] and is taking 75 mg 3 weeks on 1 week off which was started in September 2018.  Patient is on Zometa for bone metastases which she is getting every 3 months. Patient had extensive RLE DVT in April 2019 and is currently on eliquis. She also underwent venous thrombectomy and thrombolytic therapy and IVC placement by Dr. Wyn Quaker.  She also has peripheral vascular disease secondary to atherosclerosis and underwent stenting with vascular surgery as well.  Patient was on Zometa for bone metastases for 5 years and it was later on discontinued     Interval history- Patient is doing well and tolerating ibrance and letrozole well without significant side effects. Denies any specific complaints at this time  ECOG PS- 1 Pain scale- 0   Review of systems- Review of Systems  Constitutional:  Negative for chills, fever, malaise/fatigue and weight loss.  HENT:  Negative for congestion, ear discharge and nosebleeds.   Eyes:  Negative for blurred vision.  Respiratory:  Negative for cough, hemoptysis, sputum production, shortness of breath and wheezing.   Cardiovascular:  Negative for chest pain, palpitations, orthopnea and  claudication.  Gastrointestinal:  Negative for abdominal pain, blood in stool, constipation, diarrhea, heartburn, melena, nausea and vomiting.  Genitourinary:  Negative for dysuria, flank pain, frequency, hematuria and urgency.  Musculoskeletal:  Negative for back pain, joint pain and myalgias.  Skin:  Negative for rash.  Neurological:  Negative for dizziness, tingling, focal weakness, seizures, weakness and headaches.  Endo/Heme/Allergies:  Does not bruise/bleed easily.  Psychiatric/Behavioral:  Negative for depression and suicidal ideas. The patient does not have insomnia.       Allergies  Allergen Reactions   Codeine Rash    hallucinations     Past Medical History:  Diagnosis Date   Coronary artery disease involving native coronary artery 08/30/2021   a. 08/2021 Cor CTA: Cor Ca2+ = 788.  Sev mRCA dzs, Mod LAD and LCx dzs; b. 10/2021 PCI: LM nl, LAD 75/pm (3.0x15 Onyx Frontier DES), 2m, LCX 75p (2.5x18 Onyx Frontier DES), RCA 100p/m w/ R->R and L->R collats to distal vessel/RPDA.   Diastolic dysfunction    a. 09/2021 Echo: EF 60-65%, no rwma, GrI DD, nl RV fxn.   DVT (deep venous thrombosis) (HCC)    2019   Hypertension    Left Breast cancer (HCC)    Port catheter in place 09/14/2016   Placed 09/10/2016 RT chest.     Past Surgical History:  Procedure Laterality Date   ABDOMINAL HYSTERECTOMY     APPENDECTOMY     BREAST BIOPSY     CORONARY STENT INTERVENTION N/A 10/16/2021   Procedure: CORONARY STENT INTERVENTION;  Surgeon: Marykay Lex, MD;  Location: [facility] INVASIVE CV LAB: Prox  LAD 75%->0% Onyx Frontier DES 3 x 15 (3.3); Mid LCx 75% (prox to OM2) -> Onyx Frontier DES 2.5 x 18 -> tapered 2.8 to 2.6 mm   CT CTA CORONARY W/CA SCORE W/CM &/OR WO/CM  09/01/2021   Coronary Calcium Score 788.  Combination of calcified and noncalcified plaque-severe lesion in mid RCA (estimated greater than 70%).  Moderate LAD and LCx 50 to 69%). => CAD RADS 4 with severe stenosis suggested in the  RCA.  FFRCT not submitted due to motion artifact.-Recommended cardiac catheterization.   CYST EXCISION Right 09/2016   Buttocks   IVC FILTER REMOVAL N/A 01/17/2018   Procedure: IVC FILTER REMOVAL;  Surgeon: Annice Needy, MD;  Location: [facility] INVASIVE CV LAB;  Service: Cardiovascular;  Laterality: N/A;   LEFT HEART CATH AND CORONARY ANGIOGRAPHY N/A 10/16/2021   Procedure: LEFT HEART CATH AND CORONARY ANGIOGRAPHY;  Surgeon: Marykay Lex, MD;  Location: [facility] INVASIVE CV LAB:: mRCA 100% CTO (L-R  & bridging collaterals); prox LAD 75%( DES PCI) - dLAD-rDPDA colleterals; mLCx 75% just proximal to the OM 2 (DES PCI).  Normal EDP.   LOWER EXTREMITY ANGIOGRAPHY Right 11/11/2017   Procedure: LOWER EXTREMITY ANGIOGRAPHY;  Surgeon: Renford Dills, MD;  Location: [facility] CV LAB;  Service: Cardiovascular;  Laterality: Right;   PERIPHERAL VASCULAR THROMBECTOMY Right 08/30/2017   Procedure: PERIPHERAL VASCULAR THROMBECTOMY;  Surgeon: Annice Needy, MD;  Location: [facility] LAB;  Service: Cardiovascular;  Laterality: Right;   PORTACATH PLACEMENT Right 09/10/2016   Procedure: INSERTION PORT-A-CATH;  Surgeon: Leafy Ro, MD;  Location: [facility] ORS;  Service: General;  Laterality: Right;    Social History   Socioeconomic History   Marital status: Married    Spouse name: Fayrene Fearing   Number of children: 2   Years of education: 6   Highest education level: Bachelor's degree (e.g., BA, AB, BS)  Occupational History   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[employer]  Tobacco Use   Smoking status: Never   Smokeless tobacco: Never  Vaping Use   Vaping status: Never Used  Substance and Sexual Activity   Alcohol use: No   Drug use: No   Sexual activity: Never  Other Topics Concern   Not on file  Social History Narrative   Not on file   Social Determinants of Health   Financial Resource Strain: Low Risk  (04/05/2017)   Overall Financial Resource Strain (CARDIA)     Difficulty of Paying Living Expenses: 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  Food Insecurity: No Food Insecurity (04/05/2017)   Hunger Vital Sign    Worried About Running Out of Food in the Last Year: Never true    Ran Out of Food in the Last Year: Never true  Transportation Needs: No Transportation Needs (04/05/2017)   PRAPARE - Administrator, Civil Service (Medical): No    Lack of Transportation (Non-Medical): No  Physical Activity: Unknown (04/05/2017)   Exercise Vital Sign    Days of Exercise per Week: Patient declined    Minutes of Exercise per Session: Patient declined  Stress: No Stress Concern Present (04/05/2017)   [employer] of Occupational Health - Occupational Stress Questionnaire    Feeling of Stress : Not at all  Social Connections: Unknown (04/05/2017)   Social Connection and Isolation Panel [NHANES]    Frequency of Communication with Friends and Family: More than three times a week    Frequency of Social Gatherings with Friends and Family:  Once a week    Attends Religious Services: More than 4 times per year    Active Member of Clubs or Organizations: No    Attends Banker Meetings: Never    Marital Status: Not on file  Intimate Partner Violence: Not on file    Family History  Problem Relation Age of Onset   Parkinson's disease Mother    Heart disease Father    Bladder Cancer Father    Lung cancer Father    Heart disease Maternal Grandfather      Current Outpatient Medications:    acetaminophen (TYLENOL) 500 MG tablet, Take 500 mg by mouth daily as needed for moderate pain or headache. , Disp: , Rfl:    amLODipine (NORVASC) 10 MG tablet, Take 10 mg by mouth daily., Disp: , Rfl:    apixaban (ELIQUIS) 5 MG TABS tablet, Take 1 tablet (5 mg total) by mouth 2 (two) times daily., Disp: 60 tablet, Rfl: 3   atorvastatin (LIPITOR) 20 MG tablet, Take 20 mg by mouth daily., Disp: , Rfl:    Calcium Carb-Cholecalciferol (CALCIUM 600/VITAMIN D3 PO), Take 1 tablet  by mouth daily., Disp: , Rfl:    chlorhexidine (PERIDEX) 0.12 % solution, SMARTSIG:0.5 Ounce(s) By Mouth Morning-Night, Disp: , Rfl:    isosorbide mononitrate (IMDUR) 30 MG 24 hr tablet, Take 1 tablet (30 mg total) by mouth daily., Disp: 90 tablet, Rfl: 3   letrozole (FEMARA) 2.5 MG tablet, Take 1 tablet by mouth once daily, Disp: 90 tablet, Rfl: 2   lidocaine-prilocaine (EMLA) cream, Apply 1 application. topically as needed (port access)., Disp: 30 g, Rfl: 2   losartan (COZAAR) 50 MG tablet, Take 1 tablet by mouth once a day  for high blood pressure- to replace 100 mg, Disp: , Rfl:    metoprolol succinate (TOPROL-XL) 25 MG 24 hr tablet, Take 1 tablet by mouth once daily, Disp: 90 tablet, Rfl: 2   nitroGLYCERIN (NITROSTAT) 0.4 MG SL tablet, Place 1 tablet (0.4 mg total) under the tongue every 5 (five) minutes x 3 doses as needed for chest pain., Disp: 30 tablet, Rfl: 0   ondansetron (ZOFRAN) 4 MG tablet, Take 1 tablet (4 mg total) by mouth every 8 (eight) hours as needed for nausea or vomiting., Disp: 30 tablet, Rfl: 0   palbociclib (IBRANCE) 75 MG tablet, Take 1 tablet (75 mg total) by mouth daily. Take for 21 days on, 7 days off, repeat every 28 days., Disp: 21 tablet, Rfl: 3   atorvastatin (LIPITOR) 40 MG tablet, Take 40 mg by mouth at bedtime. (Patient not taking: Reported on 12/03/2022), Disp: , Rfl:   Current Facility-Administered Medications:    sodium chloride flush (NS) 0.9 % injection 3 mL, 3 mL, Intravenous, Q12H, Marykay Lex, MD  Physical exam:  Vitals:   01/20/23 1509  BP: (!) 149/53  Pulse: 64  Resp: 18  Temp: 98.2 F (36.8 C)  TempSrc: Tympanic  SpO2: 100%  Weight: 192 lb 6.4 oz (87.3 kg)  Height: 5\' 2"  (1.575 m)   Physical Exam Cardiovascular:     Rate and Rhythm: Normal rate and regular rhythm.     Heart sounds: Normal heart sounds.  Pulmonary:     Effort: Pulmonary effort is normal.     Breath sounds: Normal breath sounds.  Skin:    General: Skin is warm and  dry.  Neurological:     Mental Status: She is alert and oriented to person, place, and time.  Latest Ref Rng & Units 01/20/2023    2:33 PM  CMP  Glucose 70 - 99 mg/dL 621   BUN 8 - 23 mg/dL 13   Creatinine 3.08 - 1.00 mg/dL 6.57   Sodium 846 - 962 mmol/L 136   Potassium 3.5 - 5.1 mmol/L 3.5   Chloride 98 - 111 mmol/L 106   CO2 22 - 32 mmol/L 22   Calcium 8.9 - 10.3 mg/dL 9.2   Total Protein 6.5 - 8.1 g/dL 7.1   Total Bilirubin 0.3 - 1.2 mg/dL 1.1   Alkaline Phos 38 - 126 U/L 64   AST 15 - 41 U/L 26   ALT 0 - 44 U/L 16       Latest Ref Rng & Units 01/20/2023    3:58 PM  CBC  WBC 4.0 - 10.5 K/uL 2.4   Hemoglobin 12.0 - 15.0 g/dL 95.2   Hematocrit 84.1 - 46.0 % 35.6   Platelets 150 - 400 K/uL 168     Assessment and plan- Patient is a 82 y.o. female with history of ER positive metastatic breast cancer with bone metastases. This is a routine f/u visit of breast cancer on letrozole and ibrance  Patients initial cbc showed hb of 6.9. we repeated her CBC which shows hemoglobin is 12.5. wbc is 2.4 but anc is >1. Platelets are normal. She will continue letrozole and ibrance at this time. She has had stable disease for 6 years now. At some point I will consider stopping ibrance. Scans and labs in 2 months and see me  H/o DVT- continue eliquis   Visit Diagnosis 1. High risk medication use   2. Drug-induced neutropenia (HCC)   3. Malignant neoplasm metastatic to bone (HCC)   4. Use of letrozole (Femara)      Dr. Owens Shark, MD, MPH [facility] [phone] 01/21/2023 9:04 PM

## 2023-01-22 ENCOUNTER — Other Ambulatory Visit: Payer: Self-pay

## 2023-01-22 DIAGNOSIS — C7951 Secondary malignant neoplasm of bone: Secondary | ICD-10-CM

## 2023-01-22 DIAGNOSIS — Z79899 Other long term (current) drug therapy: Secondary | ICD-10-CM

## 2023-01-23 ENCOUNTER — Other Ambulatory Visit: Payer: Self-pay | Admitting: Cardiology

## 2023-02-09 ENCOUNTER — Other Ambulatory Visit: Payer: Self-pay

## 2023-02-09 ENCOUNTER — Other Ambulatory Visit: Payer: Self-pay | Admitting: Oncology

## 2023-02-09 DIAGNOSIS — C50912 Malignant neoplasm of unspecified site of left female breast: Secondary | ICD-10-CM

## 2023-02-09 MED ORDER — PALBOCICLIB 75 MG PO TABS
75.0000 mg | ORAL_TABLET | Freq: Every day | ORAL | 3 refills | Status: DC
  Filled 2023-02-09: qty 21, 28d supply, fill #0
  Filled 2023-03-04: qty 21, 28d supply, fill #1
  Filled 2023-04-06: qty 21, 28d supply, fill #2
  Filled 2023-04-29: qty 21, 28d supply, fill #3

## 2023-02-09 NOTE — Telephone Encounter (Signed)
CBC with Differential/Platelet Order: [id number] Status: Final result     Visible to patient: Yes (seen)     Next appt: 04/20/2023 at 08:00 AM in Radiology ([facility])     Dx: Port-A-Cath in place   1 Result Note          Component Ref Range & Units 2 wk ago (01/20/23) 2 wk ago (01/20/23) 3 mo ago (10/20/22) 7 mo ago (07/14/22) 10 mo ago (04/14/22) 1 yr ago (01/12/22) 1 yr ago (10/17/21)  WBC 4.0 - 10.5 K/uL 2.4 Low  1.5 Low  2.2 Low  2.7 Low  3.4 Low  2.5 Low  2.8 Low   RBC 3.87 - 5.11 MIL/uL 3.32 Low  1.80 Low  2.85 Low  2.88 Low  3.03 Low  3.24 Low  2.96 Low   Hemoglobin 12.0 - 15.0 g/dL 40.9 6.9 Low Panic  CM 10.8 Low  10.9 Low  11.4 Low  12.2 11.0 Low   HCT 36.0 - 46.0 % 35.6 Low  19.9 Low  30.8 Low  30.8 Low  32.2 Low  34.4 Low  31.8 Low   MCV 80.0 - 100.0 fL 107.2 High  110.6 High  108.1 High  106.9 High  106.3 High  106.2 High  107.4 High   MCH 26.0 - 34.0 pg 37.7 High  38.3 High  37.9 High  37.8 High  37.6 High  37.7 High  37.2 High   MCHC 30.0 - 36.0 g/dL 81.1 91.4 78.2 95.6 21.3 35.5 34.6  RDW 11.5 - 15.5 % 15.4 15.5 14.8 15.3 15.3 14.6 15.4  Platelets 150 - 400 K/uL 168 97 Low  174 145 Low  198 293 227  nRBC 0.0 - 0.2 % 0.0 0.0 0.0 0.0 0.0 0.0 0.0 CM  Neutrophils Relative % % 56 51 54 57 62 48   Neutro Abs 1.7 - 7.7 K/uL 1.4 Low  0.8 Low  1.2 Low  1.6 Low  2.2 1.2 Low    Lymphocytes Relative % 32 37 37 33 28 41   Lymphs Abs 0.7 - 4.0 K/uL 0.8 0.6 Low  0.8 0.9 1.0 1.0   Monocytes Relative % 8 8 6 7 6 7    Monocytes Absolute 0.1 - 1.0 K/uL 0.2 0.1 0.1 0.2 0.2 0.2   Eosinophils Relative % 2 2 1 1 2 2    Eosinophils Absolute 0.0 - 0.5 K/uL 0.0 0.0 0.0 0.0 0.1 0.1   Basophils Relative % 2 1 1 2 1 2    Basophils Absolute 0.0 - 0.1 K/uL 0.1 0.0 0.0 0.0 0.0 0.1   Immature Granulocytes % 0 1 1 0 1 0   Abs Immature Granulocytes 0.00 - 0.07 K/uL 0.01 0.01 CM 0.01 CM 0.00 CM 0.02 CM 0.00 CM   Comment: Performed at [facility], [address]  [place], [place] [address]  WBC Morphology  DIFF. CONFIRMED BY SMEAR   DIFF CONFIRMED BY MANUAL. DIFF. CONFIRMED BY SMEAR   RBC Morphology  MORPHOLOGY UNREMARKABLE   UNREMARKABLE MORPHOLOGY UNREMARKABLE   Smear Review  Normal platelet morphology CM   Normal platelet morphology CM Normal platelet morphology CM   Resulting Agency [facility]         Specimen Collected: 01/20/23 15:58 Last Resulted: 01/20/23

## 2023-02-09 NOTE — Progress Notes (Signed)
Specialty Pharmacy Refill Coordination Note  Victoria Steele is a 82 y.o. female contacted today regarding refills of specialty medication(s) Palbociclib   Patient requested Delivery   Delivery date: 02/16/23   Verified address: [address]   Medication will be filled on 02/15/23. Pending refill request.   Patient will start next cycle around 02/19/23.

## 2023-02-09 NOTE — Progress Notes (Signed)
Specialty Pharmacy Ongoing Clinical Assessment Note  Victoria Steele is a 82 y.o. female who is being followed by the specialty pharmacy service for RxSp Oncology   Patient's specialty medication(s) reviewed today: Palbociclib   Missed doses in the last 4 weeks: 0   Patient/Caregiver did not have any additional questions or concerns.   Therapeutic benefit summary: Patient is achieving benefit   Adverse events/side effects summary: No adverse events/side effects   Patient's therapy is appropriate to: Continue    Goals Addressed             This Visit's Progress    Stabilization of disease       Patient is on track. Patient will maintain adherence         Follow up:  6 months  [name] Specialty Pharmacist

## 2023-03-04 ENCOUNTER — Other Ambulatory Visit (HOSPITAL_COMMUNITY): Payer: Self-pay

## 2023-03-04 ENCOUNTER — Other Ambulatory Visit: Payer: Self-pay

## 2023-03-04 ENCOUNTER — Other Ambulatory Visit (HOSPITAL_COMMUNITY): Payer: Self-pay | Admitting: Pharmacy Technician

## 2023-03-04 NOTE — Progress Notes (Signed)
Specialty Pharmacy Refill Coordination Note  Victoria Steele is a 82 y.o. female contacted today regarding refills of specialty medication(s) Palbociclib   Patient requested Delivery   Delivery date: 03/16/23   Verified address: [address]  [place]   Medication will be filled on 03/15/23.

## 2023-03-15 ENCOUNTER — Other Ambulatory Visit: Payer: Self-pay

## 2023-03-18 ENCOUNTER — Other Ambulatory Visit: Payer: Self-pay | Admitting: Oncology

## 2023-03-19 ENCOUNTER — Encounter: Payer: Self-pay | Admitting: Oncology

## 2023-03-24 ENCOUNTER — Telehealth: Payer: Self-pay

## 2023-03-24 ENCOUNTER — Other Ambulatory Visit (HOSPITAL_COMMUNITY): Payer: Self-pay

## 2023-03-24 NOTE — Telephone Encounter (Signed)
Oral Oncology Patient Advocate Encounter   **While waiting for funding to open to renew grant for 2025, starting PAP application.**  Began application for assistance for Ibrance through [employer] Patient Assistance Program.   Application will be submitted upon completion of necessary supporting documentation.   [employer]'s phone number [phone].   I will continue to check the status until final determination.    Ardeen Fillers, CPhT Oncology Pharmacy Patient Advocate  [facility]  [phone] (phone) [phone] (fax) 03/24/2023 11:40 AM

## 2023-03-24 NOTE — Telephone Encounter (Signed)
Patient would like to meet in clinic Thursday, 03/25/23, to sign application. I will submit once signatures are in hand. I will continue to follow and update until final determination.    Ardeen Fillers, CPhT Oncology Pharmacy Patient Advocate  [facility]  [phone] (phone) [phone] (fax) 03/24/2023 11:41 AM

## 2023-04-06 ENCOUNTER — Other Ambulatory Visit: Payer: Self-pay

## 2023-04-06 NOTE — Progress Notes (Signed)
Specialty Pharmacy Refill Coordination Note  Victoria Steele is a 82 y.o. female contacted today regarding refills of specialty medication(s) Palbociclib [name])   Patient requested Delivery   Delivery date: 04/09/23   Verified address: [address]   [place] [address]   Medication will be filled on 04/08/23.

## 2023-04-08 ENCOUNTER — Other Ambulatory Visit: Payer: Self-pay

## 2023-04-12 ENCOUNTER — Encounter: Payer: Self-pay | Admitting: Oncology

## 2023-04-12 ENCOUNTER — Other Ambulatory Visit (HOSPITAL_COMMUNITY): Payer: Self-pay

## 2023-04-12 NOTE — Telephone Encounter (Signed)
Oral Oncology Patient Advocate Encounter  Was successful in securing patient a $15,000.00 grant from [employer] to provide copayment coverage for [place].  This will keep the out of pocket expense at $0.     [employer] ID: [id number]   The billing information is as follows and has been shared with [facility].    RxBin: [id number] PCN: [id number] Member ID: [id number] Group ID: [id number] Dates of Eligibility: 03/26/23 through 03/24/24  Fund:  Breast Cancer - Medicare Access   Ardeen Fillers, CPhT Oncology Pharmacy Patient Advocate  [facility]  [phone] (phone) [phone] (fax) 04/12/2023 9:59 AM

## 2023-04-12 NOTE — Telephone Encounter (Signed)
Called and left VM for patient informing them of grant renewal.    Ardeen Fillers, CPhT Oncology Pharmacy Patient Advocate  [facility]  [phone] (phone) [phone] (fax) 04/12/2023 10:04 AM

## 2023-04-15 ENCOUNTER — Other Ambulatory Visit: Payer: Self-pay | Admitting: *Deleted

## 2023-04-15 ENCOUNTER — Other Ambulatory Visit: Payer: Self-pay | Admitting: Oncology

## 2023-04-15 ENCOUNTER — Other Ambulatory Visit: Payer: Self-pay

## 2023-04-15 ENCOUNTER — Encounter: Payer: Self-pay | Admitting: Oncology

## 2023-04-15 DIAGNOSIS — C7951 Secondary malignant neoplasm of bone: Secondary | ICD-10-CM

## 2023-04-15 DIAGNOSIS — Z79899 Other long term (current) drug therapy: Secondary | ICD-10-CM

## 2023-04-15 NOTE — Telephone Encounter (Signed)
 error

## 2023-04-16 ENCOUNTER — Encounter: Payer: Self-pay | Admitting: Oncology

## 2023-04-16 ENCOUNTER — Other Ambulatory Visit: Payer: Self-pay | Admitting: Oncology

## 2023-04-16 DIAGNOSIS — C7951 Secondary malignant neoplasm of bone: Secondary | ICD-10-CM

## 2023-04-16 DIAGNOSIS — Z79899 Other long term (current) drug therapy: Secondary | ICD-10-CM

## 2023-04-20 ENCOUNTER — Encounter
Admission: RE | Admit: 2023-04-20 | Discharge: 2023-04-20 | Disposition: A | Discharge status: Home / Self Care | Payer: Medicare Other | Source: Ambulatory Visit | Attending: Oncology | Admitting: Oncology

## 2023-04-20 DIAGNOSIS — C7951 Secondary malignant neoplasm of bone: Secondary | ICD-10-CM | POA: Insufficient documentation

## 2023-04-20 DIAGNOSIS — Z79899 Other long term (current) drug therapy: Secondary | ICD-10-CM | POA: Diagnosis present

## 2023-04-20 MED ORDER — TECHNETIUM TC 99M MEDRONATE IV KIT
20.0000 | PACK | Freq: Once | INTRAVENOUS | Status: AC | PRN
  Administered 2023-04-20: 21.1 via INTRAVENOUS

## 2023-04-21 ENCOUNTER — Telehealth: Payer: Self-pay | Admitting: *Deleted

## 2023-04-21 NOTE — Telephone Encounter (Signed)
 I called the pt. And said that we found a form that she can come by and get it and fill the form in  and then turn the paper in and we have to have Smith Robert to sign the paper and then I can fax it in for her. She will come and get the form. tom

## 2023-04-22 ENCOUNTER — Ambulatory Visit
Admission: RE | Admit: 2023-04-22 | Discharge: 2023-04-22 | Disposition: A | Discharge status: Home / Self Care | Payer: Medicare Other | Source: Ambulatory Visit | Attending: Oncology | Admitting: Oncology

## 2023-04-22 DIAGNOSIS — C7951 Secondary malignant neoplasm of bone: Secondary | ICD-10-CM | POA: Insufficient documentation

## 2023-04-22 LAB — POCT I-STAT CREATININE: Creatinine, Ser: 1 mg/dL (ref 0.44–1.00)

## 2023-04-22 MED ORDER — HEPARIN SOD (PORK) LOCK FLUSH 100 UNIT/ML IV SOLN
INTRAVENOUS | Status: AC
  Filled 2023-04-22: qty 5

## 2023-04-22 MED ORDER — IOHEXOL 300 MG/ML  SOLN
100.0000 mL | Freq: Once | INTRAMUSCULAR | Status: AC | PRN
  Administered 2023-04-22: 100 mL via INTRAVENOUS

## 2023-04-23 ENCOUNTER — Telehealth: Payer: Self-pay | Admitting: *Deleted

## 2023-04-23 NOTE — Telephone Encounter (Signed)
 Faxed the papers to see if pt can get assistance for the eliquis. Te tranmission of fax went through.

## 2023-04-29 ENCOUNTER — Other Ambulatory Visit: Payer: Self-pay

## 2023-04-29 NOTE — Progress Notes (Signed)
Specialty Pharmacy Refill Coordination Note  Victoria Steele is a 83 y.o. female contacted today regarding refills of specialty medication(s) Palbociclib [name])   Patient requested Delivery   Delivery date: 05/04/23   Verified address: Patient address [address]  [place] [address]   Medication will be filled on 05/03/23.

## 2023-04-30 ENCOUNTER — Other Ambulatory Visit: Payer: Self-pay

## 2023-05-03 ENCOUNTER — Other Ambulatory Visit: Payer: Self-pay

## 2023-05-07 ENCOUNTER — Inpatient Hospital Stay (HOSPITAL_BASED_OUTPATIENT_CLINIC_OR_DEPARTMENT_OTHER): Payer: Medicare Other | Admitting: Oncology

## 2023-05-07 ENCOUNTER — Other Ambulatory Visit: Payer: Self-pay

## 2023-05-07 ENCOUNTER — Encounter: Payer: Self-pay | Admitting: Oncology

## 2023-05-07 ENCOUNTER — Telehealth: Payer: Self-pay | Admitting: *Deleted

## 2023-05-07 ENCOUNTER — Telehealth: Payer: Self-pay

## 2023-05-07 ENCOUNTER — Inpatient Hospital Stay: Payer: Medicare Other | Attending: Oncology

## 2023-05-07 VITALS — BP 150/57 | HR 68 | Temp 97.2°F | Resp 17 | Wt 192.0 lb

## 2023-05-07 DIAGNOSIS — Z79899 Other long term (current) drug therapy: Secondary | ICD-10-CM

## 2023-05-07 DIAGNOSIS — C50912 Malignant neoplasm of unspecified site of left female breast: Secondary | ICD-10-CM | POA: Diagnosis not present

## 2023-05-07 DIAGNOSIS — C7951 Secondary malignant neoplasm of bone: Secondary | ICD-10-CM | POA: Diagnosis not present

## 2023-05-07 DIAGNOSIS — Z95828 Presence of other vascular implants and grafts: Secondary | ICD-10-CM

## 2023-05-07 DIAGNOSIS — Z170 Estrogen receptor positive status [ER+]: Secondary | ICD-10-CM | POA: Diagnosis not present

## 2023-05-07 DIAGNOSIS — Z79811 Long term (current) use of aromatase inhibitors: Secondary | ICD-10-CM | POA: Insufficient documentation

## 2023-05-07 LAB — CMP (CANCER CENTER ONLY)
ALT: 15 U/L (ref 0–44)
AST: 29 U/L (ref 15–41)
Albumin: 3.5 g/dL (ref 3.5–5.0)
Alkaline Phosphatase: 59 U/L (ref 38–126)
Anion gap: 12 (ref 5–15)
BUN: 15 mg/dL (ref 8–23)
CO2: 21 mmol/L — ABNORMAL LOW (ref 22–32)
Calcium: 9.3 mg/dL (ref 8.9–10.3)
Chloride: 103 mmol/L (ref 98–111)
Creatinine: 1.11 mg/dL — ABNORMAL HIGH (ref 0.44–1.00)
GFR, Estimated: 50 mL/min — ABNORMAL LOW (ref >60)
Glucose, Bld: 218 mg/dL — ABNORMAL HIGH (ref 70–99)
Potassium: 3.5 mmol/L (ref 3.5–5.1)
Sodium: 136 mmol/L (ref 135–145)
Total Bilirubin: 1.2 mg/dL (ref 0.0–1.2)
Total Protein: 7 g/dL (ref 6.5–8.1)

## 2023-05-07 LAB — CBC WITH DIFFERENTIAL (CANCER CENTER ONLY)
Abs Immature Granulocytes: 0.02 10*3/uL (ref 0.00–0.07)
Basophils Absolute: 0.1 10*3/uL (ref 0.0–0.1)
Basophils Relative: 2 %
Eosinophils Absolute: 0 10*3/uL (ref 0.0–0.5)
Eosinophils Relative: 1 %
HCT: 33.9 % — ABNORMAL LOW (ref 36.0–46.0)
Hemoglobin: 11.7 g/dL — ABNORMAL LOW (ref 12.0–15.0)
Immature Granulocytes: 1 %
Lymphocytes Relative: 39 %
Lymphs Abs: 1 10*3/uL (ref 0.7–4.0)
MCH: 36.7 pg — ABNORMAL HIGH (ref 26.0–34.0)
MCHC: 34.5 g/dL (ref 30.0–36.0)
MCV: 106.3 fL — ABNORMAL HIGH (ref 80.0–100.0)
Monocytes Absolute: 0.1 10*3/uL (ref 0.1–1.0)
Monocytes Relative: 5 %
Neutro Abs: 1.3 10*3/uL — ABNORMAL LOW (ref 1.7–7.7)
Neutrophils Relative %: 52 %
Platelet Count: 138 10*3/uL — ABNORMAL LOW (ref 150–400)
RBC: 3.19 MIL/uL — ABNORMAL LOW (ref 3.87–5.11)
RDW: 15.6 % — ABNORMAL HIGH (ref 11.5–15.5)
Smear Review: NORMAL
WBC Count: 2.5 10*3/uL — ABNORMAL LOW (ref 4.0–10.5)
nRBC: 0 % (ref 0.0–0.2)

## 2023-05-07 MED ORDER — HEPARIN SOD (PORK) LOCK FLUSH 100 UNIT/ML IV SOLN
500.0000 [IU] | Freq: Once | INTRAVENOUS | Status: AC
  Administered 2023-05-07: 500 [IU] via INTRAVENOUS
  Filled 2023-05-07: qty 5

## 2023-05-07 MED ORDER — SODIUM CHLORIDE 0.9% FLUSH
10.0000 mL | Freq: Once | INTRAVENOUS | Status: AC
  Administered 2023-05-07: 10 mL via INTRAVENOUS
  Filled 2023-05-07: qty 10

## 2023-05-07 NOTE — Progress Notes (Signed)
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  Patient has been on Ibrance and letrozole since then.  She had cytopenias with [name] and is taking 75 mg 3 weeks on 1 week off which was started in September 2018.  Patient is on Zometa for bone metastases which she is getting every 3 months. Patient had extensive RLE DVT in April 2019 and is currently on eliquis. She also underwent venous thrombectomy and thrombolytic therapy and IVC placement by Dr. Wyn Quaker.  She also has peripheral vascular disease secondary to atherosclerosis and underwent stenting with vascular surgery as well.  Patient was on Zometa for bone metastases for 5 years and it was later on discontinued    Interval history-patient is not able to get any further financial assistance for Eliquis and therefore decided to stop anticoagulation for now.  She is tolerating letrozole and Ibrance well without any significant side effects  ECOG PS- 1 Pain scale- 0   Review of systems- Review of Systems  Constitutional:  Negative for chills, fever, malaise/fatigue and weight loss.  HENT:  Negative for congestion, ear discharge and nosebleeds.   Eyes:  Negative for blurred vision.  Respiratory:  Negative for cough, hemoptysis, sputum production, shortness of  breath and wheezing.   Cardiovascular:  Negative for chest pain, palpitations, orthopnea and claudication.  Gastrointestinal:  Negative for abdominal pain, blood in stool, constipation, diarrhea, heartburn, melena, nausea and vomiting.  Genitourinary:  Negative for dysuria, flank pain, frequency, hematuria and urgency.  Musculoskeletal:  Negative for back pain, joint pain and myalgias.  Skin:  Negative for rash.  Neurological:  Negative for dizziness, tingling, focal weakness, seizures, weakness and headaches.  Endo/Heme/Allergies:  Does not bruise/bleed easily.  Psychiatric/Behavioral:  Negative for depression and suicidal ideas. The patient does not have insomnia.       Allergies  Allergen Reactions   Codeine Rash    hallucinations     Past Medical History:  Diagnosis Date   Coronary artery disease involving native coronary artery 08/30/2021   a. 08/2021 Cor CTA: Cor Ca2+ = 788.  Sev mRCA dzs, Mod LAD and LCx dzs; b. 10/2021 PCI: LM nl, LAD 75/pm (3.0x15 Onyx Frontier DES), 103m, LCX 75p (2.5x18 Onyx Frontier DES), RCA 100p/m w/ R->R and L->R collats to distal vessel/RPDA.   Diastolic dysfunction    a. 09/2021 Echo: EF 60-65%, no rwma, GrI DD, nl RV fxn.   DVT (deep venous thrombosis) (HCC)    2019   Hypertension    Left Breast cancer (HCC)    Port catheter in place 09/14/2016   Placed 09/10/2016 RT chest.     Past Surgical History:  Procedure Laterality Date   ABDOMINAL HYSTERECTOMY     APPENDECTOMY     BREAST BIOPSY     CORONARY STENT INTERVENTION N/A 10/16/2021   Procedure: CORONARY STENT INTERVENTION;  Surgeon:  Marykay Lex, MD;  Location: [facility] INVASIVE CV LAB: Prox LAD 75%->0% Onyx Frontier DES 3 x 15 (3.3); Mid LCx 75% (prox to OM2) -> Onyx Frontier DES 2.5 x 18 -> tapered 2.8 to 2.6 mm   CT CTA CORONARY W/CA SCORE W/CM &/OR WO/CM  09/01/2021   Coronary Calcium Score 788.  Combination of calcified and noncalcified plaque-severe lesion in mid RCA (estimated greater  than 70%).  Moderate LAD and LCx 50 to 69%). => CAD RADS 4 with severe stenosis suggested in the RCA.  FFRCT not submitted due to motion artifact.-Recommended cardiac catheterization.   CYST EXCISION Right 09/2016   Buttocks   IVC FILTER REMOVAL N/A 01/17/2018   Procedure: IVC FILTER REMOVAL;  Surgeon: Annice Needy, MD;  Location: [facility] INVASIVE CV LAB;  Service: Cardiovascular;  Laterality: N/A;   LEFT HEART CATH AND CORONARY ANGIOGRAPHY N/A 10/16/2021   Procedure: LEFT HEART CATH AND CORONARY ANGIOGRAPHY;  Surgeon: Marykay Lex, MD;  Location: [facility] INVASIVE CV LAB:: mRCA 100% CTO (L-R  & bridging collaterals); prox LAD 75%( DES PCI) - dLAD-rDPDA colleterals; mLCx 75% just proximal to the OM 2 (DES PCI).  Normal EDP.   LOWER EXTREMITY ANGIOGRAPHY Right 11/11/2017   Procedure: LOWER EXTREMITY ANGIOGRAPHY;  Surgeon: Renford Dills, MD;  Location: [facility];  Service: Cardiovascular;  Laterality: Right;   PERIPHERAL VASCULAR THROMBECTOMY Right 08/30/2017   Procedure: PERIPHERAL VASCULAR THROMBECTOMY;  Surgeon: Annice Needy, MD;  Location: [facility];  Service: Cardiovascular;  Laterality: Right;   PORTACATH PLACEMENT Right 09/10/2016   Procedure: INSERTION PORT-A-CATH;  Surgeon: Leafy Ro, MD;  Location: [facility] OR[facility];  Service: General;  Laterality: Right;    Social History   Socioeconomic History   Marital status: Married    Spouse name: Fayrene Fearing   Number of children: 2   Years of education: 6   Highest education level: Bachelor's degree (e.g., BA, AB, BS)  Occupational History   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[employer]  Tobacco Use   Smoking status: Never   Smokeless tobacco: Never  Vaping Use   Vaping status: Never Used  Substance and Sexual Activity   Alcohol use: No   Drug use: No   Sexual activity: Never  Other Topics Concern   Not on file  Social History Narrative   Not on file   Social Drivers of Health   Financial  Resource Strain: Low Risk  (04/05/2017)   Overall Financial Resource Strain (CARDIA)    Difficulty of Paying Living Expenses: 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  Food Insecurity: No Food Insecurity (04/05/2017)   Hunger Vital Sign    Worried About Running Out of Food in the Last Year: Never true    Ran Out of Food in the Last Year: Never true  Transportation Needs: No Transportation Needs (04/05/2017)   PRAPARE - Administrator, Civil Service (Medical): No    Lack of Transportation (Non-Medical): No  Physical Activity: Unknown (04/05/2017)   Exercise Vital Sign    Days of Exercise per Week: Patient declined    Minutes of Exercise per Session: Patient declined  Stress: No Stress Concern Present (04/05/2017)   [employer] of Occupational Health - Occupational Stress Questionnaire    Feeling of Stress : Not at all  Social Connections: Unknown (04/05/2017)   Social Connection and Isolation Panel [NHANES]    Frequency of Communication with Friends and Family: More than three times a week  Frequency of Social Gatherings with Friends and Family: Once a week    Attends Religious Services: More than 4 times per year    Active Member of [employer] or Organizations: No    Attends Engineer, structural: Never    Marital Status: Not on file  Intimate Partner Violence: Not on file    Family History  Problem Relation Age of Onset   Parkinson's disease Mother    Heart disease Father    Bladder Cancer Father    Lung cancer Father    Heart disease Maternal Grandfather      Current Outpatient Medications:    acetaminophen (TYLENOL) 500 MG tablet, Take 500 mg by mouth daily as needed for moderate pain or headache. , Disp: , Rfl:    amLODipine (NORVASC) 10 MG tablet, Take 10 mg by mouth daily., Disp: , Rfl:    atorvastatin (LIPITOR) 20 MG tablet, Take 20 mg by mouth daily., Disp: , Rfl:    atorvastatin (LIPITOR) 40 MG tablet, Take 40 mg by mouth at bedtime., Disp: , Rfl:    Calcium  Carb-Cholecalciferol (CALCIUM 600/VITAMIN D3 PO), Take 1 tablet by mouth daily., Disp: , Rfl:    chlorhexidine (PERIDEX) 0.12 % solution, SMARTSIG:0.5 Ounce(s) By Mouth Morning-Night, Disp: , Rfl:    isosorbide mononitrate (IMDUR) 30 MG 24 hr tablet, Take 1 tablet (30 mg total) by mouth daily., Disp: 90 tablet, Rfl: 3   letrozole (FEMARA) 2.5 MG tablet, Take 1 tablet by mouth once daily, Disp: 90 tablet, Rfl: 2   lidocaine-prilocaine (EMLA) cream, Apply 1 application. topically as needed (port access)., Disp: 30 g, Rfl: 2   losartan (COZAAR) 50 MG tablet, Take 1 tablet by mouth once a day  for high blood pressure- to replace 100 mg, Disp: , Rfl:    metoprolol succinate (TOPROL-XL) 25 MG 24 hr tablet, Take 1 tablet by mouth once daily, Disp: 90 tablet, Rfl: 3   nitroGLYCERIN (NITROSTAT) 0.4 MG SL tablet, Place 1 tablet (0.4 mg total) under the tongue every 5 (five) minutes x 3 doses as needed for chest pain., Disp: 30 tablet, Rfl: 0   ondansetron (ZOFRAN) 4 MG tablet, TAKE 1 TABLET BY MOUTH EVERY 8 HOURS AS NEEDED FOR NAUSEA OR VOMITING, Disp: 30 tablet, Rfl: 0   palbociclib (IBRANCE) 75 MG tablet, Take 1 tablet (75 mg total) by mouth daily. Take for 21 days on, 7 days off, repeat every 28 days., Disp: 21 tablet, Rfl: 3   ELIQUIS 5 MG TABS tablet, Take 1 tablet by mouth twice daily (Patient not taking: Reported on 05/07/2023), Disp: 60 tablet, Rfl: 0  Current Facility-Administered Medications:    sodium chloride flush (NS) 0.9 % injection 3 mL, 3 mL, Intravenous, Q12H, Marykay Lex, MD  Physical exam:  Vitals:   05/07/23 1135 05/07/23 1137  BP: (!) 151/61 (!) 150/57  Pulse: 73 68  Resp: 17   Temp: (!) 97.2 F (36.2 C)   TempSrc: Tympanic   SpO2: 100%   Weight: 192 lb (87.1 kg)    Physical Exam Cardiovascular:     Rate and Rhythm: Normal rate and regular rhythm.     Heart sounds: Normal heart sounds.  Pulmonary:     Effort: Pulmonary effort is normal.     Breath sounds: Normal  breath sounds.  Abdominal:     General: Bowel sounds are normal.     Palpations: Abdomen is soft.  Skin:    General: Skin is warm and dry.  Neurological:  Mental Status: She is alert and oriented to person, place, and time.         Latest Ref Rng & Units 05/07/2023   11:08 AM  CMP  Glucose 70 - 99 mg/dL 409   BUN 8 - 23 mg/dL 15   Creatinine 8.11 - 1.00 mg/dL 9.14   Sodium 782 - 956 mmol/L 136   Potassium 3.5 - 5.1 mmol/L 3.5   Chloride 98 - 111 mmol/L 103   CO2 22 - 32 mmol/L 21   Calcium 8.9 - 10.3 mg/dL 9.3   Total Protein 6.5 - 8.1 g/dL 7.0   Total Bilirubin 0.0 - 1.2 mg/dL 1.2   Alkaline Phos 38 - 126 U/L 59   AST 15 - 41 U/L 29   ALT 0 - 44 U/L 15       Latest Ref Rng & Units 05/07/2023   11:08 AM  CBC  WBC 4.0 - 10.5 K/uL 2.5   Hemoglobin 12.0 - 15.0 g/dL 21.3   Hematocrit 08.6 - 46.0 % 33.9   Platelets 150 - 400 K/uL 138     No images are attached to the encounter.  NM Bone Scan Whole Body Result Date: 04/28/2023 CLINICAL DATA:  Breast cancer, invasive, stage IV, pre-therapy or re-staging breast cancer with bone mets EXAM: NUCLEAR MEDICINE WHOLE BODY BONE SCAN TECHNIQUE: Whole body anterior and posterior images were obtained approximately 3 hours after intravenous injection of radiopharmaceutical. RADIOPHARMACEUTICALS:  23.44 mCi Technetium-66m MDP IV COMPARISON:  October 07, 2022, October 06, 2021. FINDINGS: Revisualization of irregular uptake in the RIGHT axilla, unchanged in comparison to prior. No new suspicious foci in this patient with history of known widespread osseous metastatic disease. There is uptake throughout the LEFT breast, increased since 2023 There is radiotracer uptake within the sternoclavicular joints, bilateral knees, RIGHT great toe, RIGHT ankle, bilateral wrists and bilateral shoulders This is favored to be degenerative in etiology. Expected physiologic distribution of radiotracer. IMPRESSION: 1. Similar uptake within the RIGHT maxilla compared to  prior. 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 Recommend correlation with clinical history this would be best assessed with dedicated CT maxillofacial without contrast. 2. Soft tissue uptake throughout the LEFT breast, overall increased since 2023. This may reflect sequela of prior lumpectomy and radiation. Recommend correlation with mammographic history. 3. Otherwise no new suspicious foci in this patient with known treated widespread osseous metastatic disease. Electronically Signed   By: Meda Klinefelter M.D.   On: 04/28/2023 13:57   CT CHEST ABDOMEN PELVIS W CONTRAST Result Date: 04/23/2023 CLINICAL DATA:  breast cancer with mets to bone EXAM: CT CHEST, ABDOMEN, AND PELVIS WITH CONTRAST TECHNIQUE: Multidetector CT imaging of the chest, abdomen and pelvis was performed following the standard protocol during bolus administration of intravenous contrast. RADIATION DOSE REDUCTION: This exam was performed according to the departmental dose-optimization program which includes automated exposure control, adjustment of the mA and/or kV according to patient size and/or use of iterative reconstruction technique. CONTRAST:  OMNIPAQUE IOHEXOL 300 MG/ML  SOLN COMPARISON:  CT chest abdomen and pelvis 10/08/2022. FINDINGS: CT CHEST FINDINGS Cardiovascular: No significant vascular findings. Normal heart size. No pericardial effusion. Mild thoracic aorta atherosclerosis. Right chest wall infusion port tip terminating in the superior cavoatrial junction. Mediastinum/Nodes: No enlarged mediastinal, hilar, or axillary lymph nodes. Thyroid gland, trachea, and esophagus demonstrate no significant findings. Lungs/Pleura: Lungs are clear. No pleural effusion or pneumothorax. Musculoskeletal: Similar diffuse sclerotic osseous metastases. CT ABDOMEN PELVIS FINDINGS Hepatobiliary: No focal liver abnormality  is seen. Cholelithiasis without gallbladder wall thickening or biliary dilatation. Pancreas: Unremarkable. No pancreatic  ductal dilatation or surrounding inflammatory changes. Spleen: Normal in size without focal abnormality. Adrenals/Urinary Tract: Adrenal glands are unremarkable. Kidneys are normal, without renal calculi, focal lesion, or hydronephrosis. Bladder is unremarkable. Stomach/Bowel: Stomach is within normal limits. Distal duodenal diverticulum. Appendix appears normal. Colonic diverticulosis. No evidence of bowel wall thickening, distention, or inflammatory changes. Vascular/Lymphatic: Aortic atherosclerosis. No enlarged abdominal or pelvic lymph nodes. Reproductive: Status post hysterectomy. No adnexal masses. Other: No abdominal wall hernia or abnormality. No abdominopelvic ascites. Musculoskeletal: Similar diffuse sclerotic osseous metastases. IMPRESSION: 1. Similar diffuse sclerotic osseous metastases. 2. No soft tissue metastases in the chest, abdomen or pelvis. Electronically Signed   By: Levi Aland M.D.   On: 04/23/2023 13:02     Assessment and plan- Patient is a 83 y.o. female with history of metastatic ER/PR positive HER2 negative breast cancer currently on Ibrance plus letrozole here for routine follow-up  Patient has been on letrozole and Ibrance combination For over 6 years now.  Her recent CT chest abdomen and pelvis as well as bone scan from January 2025 does not show any evidence of recurrent or progressive disease.  I am not sure what the radiologist means by adult Janik disease but patient does not complain of any pain in her right maxilla.  She will continue with letrozole and Ibrance combination and I will see her back in 3 months with labs and a bone density scan prior.  She has mild drug-induced neutropenia from Ibrance but ANC has remained more than 1.  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  Given that her breast cancer has been stable and she is unable to afford Eliquis we are holding off on anticoagulation at this time.  Should patient have recurrent episodes of DVT we will  consider putting her back on anticoagulation.  Patient is concerned about her gait and at times she feels that her left knee gives way and she may be prone to falls.  She is requesting walker for the same which I think would be reasonable to avoid any future falls   Visit Diagnosis 1. Malignant neoplasm metastatic to bone (HCC)   2. High risk medication use   3. Use of letrozole (Femara)      Dr. Owens Shark, MD, MPH [facility] at [facility] [phone] 05/07/2023 2:08 PM

## 2023-05-07 NOTE — Progress Notes (Signed)
Patient here for oncology follow-up appointment, concerns of restarting Eliquis

## 2023-05-07 NOTE — Telephone Encounter (Signed)
The patient had a visit with Dr. Smith Robert today and so she sent me some papers to look at and it was a [employer] and she wanted to make sure is that something that is legitimate or that something that somebody is trying to do something bad.  I called Alyson and she said that the [employer] well foundation is what covers her [name] that she takes for the breast cancer.  She was happy with that, also she was trying to get help for her Eliquis and when I called to check on that with her partner the she has to have out-of-pocket of $1029 before they would help her with the Eliquis.  When I called her back this evening she said that she had talked to Dr. Smith Robert and she has been off of the Eliquis for 20 days because she just could not afford it and she wanted to just see if she can just stay off of it.  Dr. Smith Robert says that she is okay to try that but if she gets another blood clot she is definitely going to have to go back on it again and so Dr. Smith Robert and the patient is okay for now not being on

## 2023-05-07 NOTE — Telephone Encounter (Signed)
Faxed walker request to [facility] to [phone]

## 2023-05-10 LAB — CA 27.29 (SERIAL MONITOR): CA 27.29: 86.4 U/mL — ABNORMAL HIGH (ref 0.0–38.6)

## 2023-05-20 ENCOUNTER — Other Ambulatory Visit: Payer: Self-pay | Admitting: Oncology

## 2023-05-20 ENCOUNTER — Other Ambulatory Visit (HOSPITAL_COMMUNITY): Payer: Self-pay

## 2023-05-20 ENCOUNTER — Other Ambulatory Visit: Payer: Self-pay

## 2023-05-20 DIAGNOSIS — C50912 Malignant neoplasm of unspecified site of left female breast: Secondary | ICD-10-CM

## 2023-05-20 NOTE — Progress Notes (Signed)
 Specialty Pharmacy Refill Coordination Note  Victoria Steele is a 83 y.o. female contacted today regarding refills of specialty medication(s) Palbociclib  (IBRANCE )   Patient requested Delivery   Delivery date: 06/09/23   Verified address: [address]   [place] [address]   Medication will be filled on 06/08/23.   Pending refill request  This fill date is pending response to refill request from provider. Patient is aware and if they have not received fill by intended date, they must follow up with pharmacy.

## 2023-05-21 ENCOUNTER — Other Ambulatory Visit (HOSPITAL_COMMUNITY): Payer: Self-pay

## 2023-05-26 ENCOUNTER — Other Ambulatory Visit: Payer: Self-pay

## 2023-05-27 ENCOUNTER — Other Ambulatory Visit: Payer: Self-pay | Admitting: Oncology

## 2023-05-27 ENCOUNTER — Other Ambulatory Visit: Payer: Self-pay

## 2023-05-27 DIAGNOSIS — C50912 Malignant neoplasm of unspecified site of left female breast: Secondary | ICD-10-CM

## 2023-05-28 ENCOUNTER — Other Ambulatory Visit: Payer: Self-pay

## 2023-05-28 MED ORDER — PALBOCICLIB 75 MG PO TABS
75.0000 mg | ORAL_TABLET | Freq: Every day | ORAL | 3 refills | Status: DC
Start: 2023-05-28 — End: 2023-09-21
  Filled 2023-05-28: qty 21, 28d supply, fill #0
  Filled 2023-06-30: qty 21, 28d supply, fill #1
  Filled 2023-07-27: qty 21, 28d supply, fill #2
  Filled 2023-08-27: qty 21, 28d supply, fill #3

## 2023-06-30 ENCOUNTER — Other Ambulatory Visit: Payer: Self-pay

## 2023-06-30 NOTE — Progress Notes (Signed)
 Specialty Pharmacy Refill Coordination Note  Victoria Steele is a 83 y.o. female contacted today regarding refills of specialty medication(s) Palbociclib [name])   Patient requested (Patient-Rptd) Delivery   Delivery date: 07/06/23   Verified address: (Patient-Rptd) [address] [place], [place] [address]   Medication will be filled on 03.24.25.

## 2023-07-05 ENCOUNTER — Other Ambulatory Visit (INDEPENDENT_AMBULATORY_CARE_PROVIDER_SITE_OTHER): Payer: Self-pay | Admitting: Vascular Surgery

## 2023-07-05 DIAGNOSIS — I739 Peripheral vascular disease, unspecified: Secondary | ICD-10-CM

## 2023-07-06 ENCOUNTER — Encounter (INDEPENDENT_AMBULATORY_CARE_PROVIDER_SITE_OTHER): Payer: Self-pay | Admitting: Vascular Surgery

## 2023-07-06 ENCOUNTER — Ambulatory Visit (INDEPENDENT_AMBULATORY_CARE_PROVIDER_SITE_OTHER): Payer: Medicare Other

## 2023-07-06 ENCOUNTER — Ambulatory Visit (INDEPENDENT_AMBULATORY_CARE_PROVIDER_SITE_OTHER): Payer: Medicare Other | Admitting: Vascular Surgery

## 2023-07-06 VITALS — BP 150/70 | HR 81 | Resp 18 | Ht 62.0 in | Wt 195.0 lb

## 2023-07-06 DIAGNOSIS — I739 Peripheral vascular disease, unspecified: Secondary | ICD-10-CM

## 2023-07-06 DIAGNOSIS — I82511 Chronic embolism and thrombosis of right femoral vein: Secondary | ICD-10-CM | POA: Diagnosis not present

## 2023-07-06 DIAGNOSIS — I10 Essential (primary) hypertension: Secondary | ICD-10-CM

## 2023-07-06 DIAGNOSIS — M79674 Pain in right toe(s): Secondary | ICD-10-CM

## 2023-07-06 DIAGNOSIS — C50112 Malignant neoplasm of central portion of left female breast: Secondary | ICD-10-CM | POA: Diagnosis not present

## 2023-07-06 DIAGNOSIS — Z170 Estrogen receptor positive status [ER+]: Secondary | ICD-10-CM

## 2023-07-06 NOTE — Assessment & Plan Note (Signed)
 ABIs today are 1.01 on the right 0.84 on the left with biphasic waveforms.  She has good digital pressures and waveforms bilaterally.  No changes in medication.  Follow-up in 1 year.

## 2023-07-06 NOTE — Assessment & Plan Note (Signed)
 ABIs today are 1.01 on the right 0.84 on the left with biphasic waveforms.  She has good digital pressures and waveforms bilaterally. Unclear source.  Referral to Podiatry

## 2023-07-06 NOTE — Progress Notes (Signed)
 MRN : [id number]  Victoria Steele is a 83 y.o. (05-24-1940) female who presents with chief complaint of  Chief Complaint  Patient presents with   Follow-up    followup in 1 year with abi  .  History of Present Illness: Patient returns today in follow up of her PAD as well as previous history of DVT.  She actually underwent venous thrombectomy over 5 years ago.  She has been followed for mild peripheral arterial disease over time.  Over the past several months, she has had pain, swelling, and purplish discoloration of her right great toe.  ABIs today are 1.01 on the right 0.84 on the left with biphasic waveforms.  She has good digital pressures and waveforms bilaterally.  Current Outpatient Medications  Medication Sig Dispense Refill   acetaminophen (TYLENOL) 500 MG tablet Take 500 mg by mouth daily as needed for moderate pain or headache.      amLODipine (NORVASC) 10 MG tablet Take 10 mg by mouth daily.     atorvastatin (LIPITOR) 20 MG tablet Take 20 mg by mouth daily.     atorvastatin (LIPITOR) 40 MG tablet Take 40 mg by mouth at bedtime.     Calcium Carb-Cholecalciferol (CALCIUM 600/VITAMIN D3 PO) Take 1 tablet by mouth daily.     chlorhexidine (PERIDEX) 0.12 % solution SMARTSIG:0.5 Ounce(s) By Mouth Morning-Night     isosorbide mononitrate (IMDUR) 30 MG 24 hr tablet Take 1 tablet (30 mg total) by mouth daily. 90 tablet 3   letrozole (FEMARA) 2.5 MG tablet Take 1 tablet by mouth once daily 90 tablet 2   lidocaine-prilocaine (EMLA) cream Apply 1 application. topically as needed (port access). 30 g 2   losartan (COZAAR) 50 MG tablet Take 1 tablet by mouth once a day  for high blood pressure- to replace 100 mg     metoprolol succinate (TOPROL-XL) 25 MG 24 hr tablet Take 1 tablet by mouth once daily 90 tablet 3   nitroGLYCERIN (NITROSTAT) 0.4 MG SL tablet Place 1 tablet (0.4 mg total) under the tongue every 5 (five) minutes x 3 doses as needed for chest pain. 30 tablet 0   ondansetron  (ZOFRAN) 4 MG tablet TAKE 1 TABLET BY MOUTH EVERY 8 HOURS AS NEEDED FOR NAUSEA OR VOMITING 30 tablet 0   palbociclib (IBRANCE) 75 MG tablet Take 1 tablet (75 mg total) by mouth daily. Take for 21 days on, 7 days off, repeat every 28 days. 21 tablet 3   ELIQUIS 5 MG TABS tablet Take 1 tablet by mouth twice daily (Patient not taking: Reported on 07/06/2023) 60 tablet 0   Current Facility-Administered Medications  Medication Dose Route Frequency Provider Last Rate Last Admin   sodium chloride flush (NS) 0.9 % injection 3 mL  3 mL Intravenous Q12H Marykay Lex, MD        Past Medical History:  Diagnosis Date   Coronary artery disease involving native coronary artery 08/30/2021   a. 08/2021 Cor CTA: Cor Ca2+ = 788.  Sev mRCA dzs, Mod LAD and LCx dzs; b. 10/2021 PCI: LM nl, LAD 75/pm (3.0x15 Onyx Frontier DES), 71m, LCX 75p (2.5x18 Onyx Frontier DES), RCA 100p/m w/ R->R and L->R collats to distal vessel/RPDA.   Diastolic dysfunction    a. 09/2021 Echo: EF 60-65%, no rwma, GrI DD, nl RV fxn.   DVT (deep venous thrombosis) (HCC)    2019   Hypertension    Left Breast cancer (HCC)    Port catheter in place  09/14/2016   Placed 09/10/2016 RT chest.    Past Surgical History:  Procedure Laterality Date   ABDOMINAL HYSTERECTOMY     APPENDECTOMY     BREAST BIOPSY     CORONARY STENT INTERVENTION N/A 10/16/2021   Procedure: CORONARY STENT INTERVENTION;  Surgeon: Marykay Lex, MD;  Location: [facility]: Prox LAD 75%->0% Onyx Frontier DES 3 x 15 (3.3); Mid LCx 75% (prox to OM2) -> Onyx Frontier DES 2.5 x 18 -> tapered 2.8 to 2.6 mm   CT CTA CORONARY W/CA SCORE W/CM &/OR WO/CM  09/01/2021   Coronary Calcium Score 788.  Combination of calcified and noncalcified plaque-severe lesion in mid RCA (estimated greater than 70%).  Moderate LAD and LCx 50 to 69%). => CAD RADS 4 with severe stenosis suggested in the RCA.  FFRCT not submitted due to motion artifact.-Recommended cardiac catheterization.    CYST EXCISION Right 09/2016   Buttocks   IVC FILTER REMOVAL N/A 01/17/2018   Procedure: IVC FILTER REMOVAL;  Surgeon: Annice Needy, MD;  Location: [facility];  Service: Cardiovascular;  Laterality: N/A;   LEFT HEART CATH AND CORONARY ANGIOGRAPHY N/A 10/16/2021   Procedure: LEFT HEART CATH AND CORONARY ANGIOGRAPHY;  Surgeon: Marykay Lex, MD;  Location: [facility] CV LAB:: mRCA 100% CTO (L-R  & bridging collaterals); prox LAD 75%( DES PCI) - dLAD-rDPDA colleterals; mLCx 75% just proximal to the OM 2 (DES PCI).  Normal EDP.   LOWER EXTREMITY ANGIOGRAPHY Right 11/11/2017   Procedure: LOWER EXTREMITY ANGIOGRAPHY;  Surgeon: Renford Dills, MD;  Location: [facility];  Service: Cardiovascular;  Laterality: Right;   PERIPHERAL VASCULAR THROMBECTOMY Right 08/30/2017   Procedure: PERIPHERAL VASCULAR THROMBECTOMY;  Surgeon: Annice Needy, MD;  Location: [facility];  Service: Cardiovascular;  Laterality: Right;   PORTACATH PLACEMENT Right 09/10/2016   Procedure: INSERTION PORT-A-CATH;  Surgeon: Leafy Ro, MD;  Location: [facility];  Service: General;  Laterality: Right;     Social History   Tobacco Use   Smoking status: Never   Smokeless tobacco: Never  Vaping Use   Vaping status: Never Used  Substance Use Topics   Alcohol use: No   Drug use: No      Family History  Problem Relation Age of Onset   Parkinson's disease Mother    Heart disease Father    Bladder Cancer Father    Lung cancer Father    Heart disease Maternal Grandfather      Allergies  Allergen Reactions   Codeine Rash    hallucinations     REVIEW OF SYSTEMS (Negative unless checked)   Constitutional: [] Weight loss  [] Fever  [] Chills Cardiac: [] Chest pain   [] Chest pressure   [] Palpitations   [] Shortness of breath when laying flat   [] Shortness of breath at rest   [] Shortness of breath with exertion. Vascular:  [] Pain in legs with walking   [] Pain in legs at rest   [] Pain in  legs when laying flat   [] Claudication   [] Pain in feet when walking  [] Pain in feet at rest  [] Pain in feet when laying flat   [x] History of DVT   [x] Phlebitis   [x] Swelling in legs   [] Varicose veins   [] Non-healing ulcers Pulmonary:   [] Uses home oxygen   [] Productive cough   [] Hemoptysis   [] Wheeze  [] COPD   [] Asthma Neurologic:  [] Dizziness  [] Blackouts   [] Seizures   [] History of stroke   [] History of TIA  []   Aphasia   [] Temporary blindness   [] Dysphagia   [] Weakness or numbness in arms   [] Weakness or numbness in legs Musculoskeletal:  [x] Arthritis   [] Joint swelling   [] Joint pain   [] Low back pain Hematologic:  [] Easy bruising  [] Easy bleeding   [] Hypercoagulable state   [] Anemic  [] Hepatitis Gastrointestinal:  [] Blood in stool   [] Vomiting blood  [] Gastroesophageal reflux/heartburn   [] Abdominal pain Genitourinary:  [] Chronic kidney disease   [] Difficult urination  [] Frequent urination  [] Burning with urination   [] Hematuria Skin:  [] Rashes   [] Ulcers   [] Wounds Psychological:  [] History of anxiety   []  History of major depression.  Physical Examination  BP (!) 150/70   Pulse 81   Resp 18   Ht 5\' 2"  (1.575 m)   Wt 195 lb (88.5 kg)   BMI 35.67 kg/m  Gen:  WD/WN, NAD. Appears younger than stated age. Head: [place]/AT, No temporalis wasting. Ear/Nose/Throat: Hearing grossly intact, nares w/o erythema or drainage Eyes: Conjunctiva clear. Sclera non-icteric Neck: Supple.  Trachea midline Pulmonary:  Good air movement, no use of accessory muscles.  Cardiac: RRR, no JVD Vascular:  Vessel Right Left  Radial Palpable Palpable                          PT 2+ Palpable 2+ Palpable  DP 2+ Palpable 1+ Palpable   Gastrointestinal: soft, non-tender/non-distended. No guarding/reflex.  Musculoskeletal: M/S 5/5 throughout.  No deformity or atrophy.  Right great toe is purple and swollen.  No significant lower extremity edema otherwise. Neurologic: Sensation grossly intact in extremities.   Symmetrical.  Speech is fluent.  Psychiatric: Judgment intact, Mood & affect appropriate for pt's clinical situation. Dermatologic: No rashes or ulcers noted.  No cellulitis or open wounds.      Labs Recent Results (from the past 2160 hours)  I-STAT creatinine     Status: None   Collection Time: 04/22/23  3:57 PM  Result Value Ref Range   Creatinine, Ser 1.00 0.44 - 1.00 mg/dL  CA 16.10 (SERIAL MONITOR)     Status: Abnormal   Collection Time: 05/07/23 11:08 AM  Result Value Ref Range   CA 27.29 86.4 (H) 0.0 - 38.6 U/mL    Comment: (NOTE) Siemens Centaur Immunochemiluminometric Methodology (ICMA) Values obtained with different assay methods or kits cannot be used interchangeably. Results cannot be interpreted as absolute evidence of the presence or absence of malignant disease. Performed At: [facility] [address] [place], [place] [id number] Jolene Schimke MD JX:[id number]   CMP (Cancer Center only)     Status: Abnormal   Collection Time: 05/07/23 11:08 AM  Result Value Ref Range   Sodium 136 135 - 145 mmol/L   Potassium 3.5 3.5 - 5.1 mmol/L   Chloride 103 98 - 111 mmol/L   CO2 21 (L) 22 - 32 mmol/L   Glucose, Bld 218 (H) 70 - 99 mg/dL    Comment: Glucose reference range applies only to samples taken after fasting for at least 8 hours.   BUN 15 8 - 23 mg/dL   Creatinine 1.30 (H) 8.65 - 1.00 mg/dL   Calcium 9.3 8.9 - 78.4 mg/dL   Total Protein 7.0 6.5 - 8.1 g/dL   Albumin 3.5 3.5 - 5.0 g/dL   AST 29 15 - 41 U/L   ALT 15 0 - 44 U/L   Alkaline Phosphatase 59 38 - 126 U/L   Total Bilirubin 1.2 0.0 - 1.2 mg/dL  GFR, Estimated 50 (L) >60 mL/min    Comment: (NOTE) Calculated using the CKD-EPI Creatinine Equation (2021)    Anion gap 12 5 - 15    Comment: Performed at [facility], [address] [place], [place] [address]  CBC with Differential (Cancer Center Only)     Status: Abnormal   Collection Time: 05/07/23 11:08 AM  Result Value Ref Range    WBC Count 2.5 (L) 4.0 - 10.5 K/uL   RBC 3.19 (L) 3.87 - 5.11 MIL/uL   Hemoglobin 11.7 (L) 12.0 - 15.0 g/dL   HCT 08.6 (L) 57.8 - 46.9 %   MCV 106.3 (H) 80.0 - 100.0 fL   MCH 36.7 (H) 26.0 - 34.0 pg   MCHC 34.5 30.0 - 36.0 g/dL   RDW 62.9 (H) 52.8 - 41.3 %   Platelet Count 138 (L) 150 - 400 K/uL   nRBC 0.0 0.0 - 0.2 %   Neutrophils Relative % 52 %   Neutro Abs 1.3 (L) 1.7 - 7.7 K/uL   Lymphocytes Relative 39 %   Lymphs Abs 1.0 0.7 - 4.0 K/uL   Monocytes Relative 5 %   Monocytes Absolute 0.1 0.1 - 1.0 K/uL   Eosinophils Relative 1 %   Eosinophils Absolute 0.0 0.0 - 0.5 K/uL   Basophils Relative 2 %   Basophils Absolute 0.1 0.0 - 0.1 K/uL   WBC Morphology DIFF. CONFIRMED BY SMEAR    RBC Morphology MORPHOLOGY UNREMARKABLE    Smear Review Normal platelet morphology     Comment: PLATELETS APPEAR DECREASED   Immature Granulocytes 1 %   Abs Immature Granulocytes 0.02 0.00 - 0.07 K/uL    Comment: Performed at [facility], [address] [place], [place] [address]    Radiology No results found.  Assessment/Plan  Great toe pain, right ABIs today are 1.01 on the right 0.84 on the left with biphasic waveforms.  She has good digital pressures and waveforms bilaterally. Unclear source.  Referral to Podiatry  PAD (peripheral artery disease) (HCC) ABIs today are 1.01 on the right 0.84 on the left with biphasic waveforms.  She has good digital pressures and waveforms bilaterally.  No changes in medication.  Follow-up in 1 year.  Breast cancer in female [facility]) This increases her clotting risk and is likely a cause of her being hypercoagulable.   Hypertension blood pressure control important in reducing the progression of atherosclerotic disease. On appropriate oral medications.     DVT (deep venous thrombosis) (HCC) Status post thrombectomy about 5 years ago.  No significant pain or swelling post procedure.  Festus Barren, MD  07/06/2023 2:07 PM    This note was created with  Dragon medical transcription system.  Any errors from dictation are purely unintentional

## 2023-07-08 LAB — VAS US ABI WITH/WO TBI
Left ABI: 0.84
Right ABI: 1.01

## 2023-07-14 ENCOUNTER — Ambulatory Visit (INDEPENDENT_AMBULATORY_CARE_PROVIDER_SITE_OTHER)

## 2023-07-14 ENCOUNTER — Ambulatory Visit: Admitting: Podiatry

## 2023-07-14 ENCOUNTER — Encounter: Payer: Self-pay | Admitting: Podiatry

## 2023-07-14 DIAGNOSIS — M2011 Hallux valgus (acquired), right foot: Secondary | ICD-10-CM

## 2023-07-14 DIAGNOSIS — L03031 Cellulitis of right toe: Secondary | ICD-10-CM | POA: Diagnosis not present

## 2023-07-14 DIAGNOSIS — M2041 Other hammer toe(s) (acquired), right foot: Secondary | ICD-10-CM | POA: Diagnosis not present

## 2023-07-14 NOTE — Progress Notes (Signed)
 Subjective:  Patient ID: Victoria Steele, female    DOB: 1940/09/25,  MRN: [id number]  Chief Complaint  Patient presents with   Toe Pain    "My right big toe hurts all over."    Discussed the use of AI scribe software for clinical note transcription with the patient, who gave verbal consent to proceed.  History of Present Illness Victoria Steele is an 83 year old female who presents with right great toe pain and swelling. She was referred to [place] after evaluation by vascular surgery.  She experiences pain and swelling in her right great toe, describing it as significantly larger than the other toe. The pain can be severe, and she recalls an incident where the sheet touching her toe caused discomfort. No trauma, such as dropping something on it or kicking anything, has occurred. No drainage from the toenail, but there is peeling skin around the area.  Her toenails are not growing well and are described as 'awfully crumbly,' which she attributes to her chemotherapy treatment, believing it slows down her growth processes. She has been using Epsom salt soaks for about six months without improvement.  An x-ray was performed by her primary care doctor, showing no fracture or damage. Despite this, she mentions that the pain can be severe, stating that 'if I had been here yesterday, you would have heard some screaming when you were touching it.'      Objective:    Physical Exam VASCULAR: DP and PT pulse palpable. Foot is warm and well-perfused. Capillary fill time is brisk. DERMATOLOGIC: Normal skin turgor, texture, and temperature. No open lesions, rashes, or ulcerations. NEUROLOGIC: Normal sensation to light touch and pressure. No paresthesias. ORTHOPEDIC: Smooth, pain-free range of motion of all examined joints. No ecchymosis, bruising. No pain to palpation. EXTREMITIES: Right great toenail mycotic, dystrophic with erythema around hallux, ingrowing incurvated medial border, lamellar dystrophic  nail growth.    Results Procedure: Total Nail Avulsion Description: Following consent and digital block with lidocaine and bupivacaine (Marcaine) 1.5 cc each, the hallux was prepped with betadine, secured around the base of the toe, and exsanguinated. The nail plate was removed. The nail bed was debrided of all nail debris, irrigated, and dressed with a compressive bandage. Informed Consent: Counseling included the use of local anesthetics (lidocaine and bupivacaine), the removal of the toenail, the expected regrowth time of approximately one year, and post-procedure care instructions including the use of topical antibiotic ointment (Neosporin), adhesive bandages (Band-Aid), and Epsom salt soaks.  RADIOLOGY Right foot radiographs: Mild hallux valgus deformity, no major arthritic changes, no fracture or dislocation (07/13/2023)  DIAGNOSTIC ABI: 1.01 with good digital waveforms and pressures   Assessment:   1. Acquired hallux valgus of right foot   2. Paronychia of great toe, right      Plan:  Patient was evaluated and treated and all questions answered.  Assessment and Plan Assessment & Plan Ingrown toenail with paronychia [age]-year-old female with pain and swelling in the right great toe. The toenail is loose, mycotic, and dystrophic with erythema around the hallux and an incurvated medial border, indicating chronic lamellar dystrophic nail growth. Paronychia is developing. Chemotherapy may affect nail growth and healing. Total nail avulsion is planned to alleviate the ingrown nail and mycosis. Informed consent obtained, discussing procedure, risks, and benefits. Anticipated outcome: pain relief and infection prevention, with toenail regrowth in approximately one year. -Performed total nail avulsion as noted above. -Follow-up as needed -We discussed if redness does not improve in  the next few days then I would recommend further oral antibiotics and she will notify me if this is the  case.      No follow-ups on file.

## 2023-07-14 NOTE — Patient Instructions (Signed)

## 2023-07-15 ENCOUNTER — Emergency Department

## 2023-07-15 ENCOUNTER — Other Ambulatory Visit

## 2023-07-15 ENCOUNTER — Encounter: Payer: Self-pay | Admitting: Internal Medicine

## 2023-07-15 ENCOUNTER — Other Ambulatory Visit: Payer: Self-pay | Admitting: Emergency Medicine

## 2023-07-15 ENCOUNTER — Inpatient Hospital Stay
Admission: EM | Admit: 2023-07-15 | Discharge: 2023-07-17 | DRG: 444 | Disposition: A | Discharge status: Home / Self Care | Attending: Internal Medicine | Admitting: Internal Medicine

## 2023-07-15 ENCOUNTER — Inpatient Hospital Stay

## 2023-07-15 ENCOUNTER — Other Ambulatory Visit: Payer: Self-pay

## 2023-07-15 DIAGNOSIS — I82431 Acute embolism and thrombosis of right popliteal vein: Secondary | ICD-10-CM | POA: Diagnosis present

## 2023-07-15 DIAGNOSIS — I2693 Single subsegmental pulmonary embolism without acute cor pulmonale: Secondary | ICD-10-CM | POA: Diagnosis present

## 2023-07-15 DIAGNOSIS — Z7901 Long term (current) use of anticoagulants: Secondary | ICD-10-CM

## 2023-07-15 DIAGNOSIS — Z8052 Family history of malignant neoplasm of bladder: Secondary | ICD-10-CM | POA: Diagnosis not present

## 2023-07-15 DIAGNOSIS — Z8249 Family history of ischemic heart disease and other diseases of the circulatory system: Secondary | ICD-10-CM

## 2023-07-15 DIAGNOSIS — Z86718 Personal history of other venous thrombosis and embolism: Secondary | ICD-10-CM

## 2023-07-15 DIAGNOSIS — C799 Secondary malignant neoplasm of unspecified site: Secondary | ICD-10-CM | POA: Diagnosis present

## 2023-07-15 DIAGNOSIS — Z801 Family history of malignant neoplasm of trachea, bronchus and lung: Secondary | ICD-10-CM | POA: Diagnosis not present

## 2023-07-15 DIAGNOSIS — I10 Essential (primary) hypertension: Secondary | ICD-10-CM | POA: Diagnosis present

## 2023-07-15 DIAGNOSIS — I2699 Other pulmonary embolism without acute cor pulmonale: Secondary | ICD-10-CM | POA: Diagnosis not present

## 2023-07-15 DIAGNOSIS — C50912 Malignant neoplasm of unspecified site of left female breast: Secondary | ICD-10-CM | POA: Diagnosis present

## 2023-07-15 DIAGNOSIS — Z9049 Acquired absence of other specified parts of digestive tract: Secondary | ICD-10-CM

## 2023-07-15 DIAGNOSIS — Z820 Family history of epilepsy and other diseases of the nervous system: Secondary | ICD-10-CM | POA: Diagnosis not present

## 2023-07-15 DIAGNOSIS — R079 Chest pain, unspecified: Secondary | ICD-10-CM | POA: Diagnosis present

## 2023-07-15 DIAGNOSIS — Z79811 Long term (current) use of aromatase inhibitors: Secondary | ICD-10-CM | POA: Diagnosis not present

## 2023-07-15 DIAGNOSIS — Z79899 Other long term (current) drug therapy: Secondary | ICD-10-CM

## 2023-07-15 DIAGNOSIS — E785 Hyperlipidemia, unspecified: Secondary | ICD-10-CM | POA: Diagnosis present

## 2023-07-15 DIAGNOSIS — E876 Hypokalemia: Secondary | ICD-10-CM | POA: Diagnosis present

## 2023-07-15 DIAGNOSIS — Z885 Allergy status to narcotic agent status: Secondary | ICD-10-CM | POA: Diagnosis not present

## 2023-07-15 DIAGNOSIS — Z955 Presence of coronary angioplasty implant and graft: Secondary | ICD-10-CM | POA: Diagnosis not present

## 2023-07-15 DIAGNOSIS — Z7982 Long term (current) use of aspirin: Secondary | ICD-10-CM | POA: Diagnosis not present

## 2023-07-15 DIAGNOSIS — K810 Acute cholecystitis: Secondary | ICD-10-CM | POA: Insufficient documentation

## 2023-07-15 DIAGNOSIS — K8000 Calculus of gallbladder with acute cholecystitis without obstruction: Secondary | ICD-10-CM | POA: Diagnosis present

## 2023-07-15 DIAGNOSIS — Z90710 Acquired absence of both cervix and uterus: Secondary | ICD-10-CM | POA: Diagnosis not present

## 2023-07-15 DIAGNOSIS — Z853 Personal history of malignant neoplasm of breast: Secondary | ICD-10-CM

## 2023-07-15 DIAGNOSIS — I2510 Atherosclerotic heart disease of native coronary artery without angina pectoris: Secondary | ICD-10-CM | POA: Diagnosis present

## 2023-07-15 DIAGNOSIS — I4510 Unspecified right bundle-branch block: Secondary | ICD-10-CM | POA: Diagnosis present

## 2023-07-15 LAB — CBC WITH DIFFERENTIAL/PLATELET
Abs Immature Granulocytes: 0.01 10*3/uL (ref 0.00–0.07)
Basophils Absolute: 0.1 10*3/uL (ref 0.0–0.1)
Basophils Relative: 1 %
Eosinophils Absolute: 0 10*3/uL (ref 0.0–0.5)
Eosinophils Relative: 0 %
HCT: 33.5 % — ABNORMAL LOW (ref 36.0–46.0)
Hemoglobin: 11.6 g/dL — ABNORMAL LOW (ref 12.0–15.0)
Immature Granulocytes: 0 %
Lymphocytes Relative: 16 %
Lymphs Abs: 0.7 10*3/uL (ref 0.7–4.0)
MCH: 37.4 pg — ABNORMAL HIGH (ref 26.0–34.0)
MCHC: 34.6 g/dL (ref 30.0–36.0)
MCV: 108.1 fL — ABNORMAL HIGH (ref 80.0–100.0)
Monocytes Absolute: 0.2 10*3/uL (ref 0.1–1.0)
Monocytes Relative: 5 %
Neutro Abs: 3.4 10*3/uL (ref 1.7–7.7)
Neutrophils Relative %: 78 %
Platelets: 206 10*3/uL (ref 150–400)
RBC: 3.1 MIL/uL — ABNORMAL LOW (ref 3.87–5.11)
RDW: 15.5 % (ref 11.5–15.5)
WBC: 4.4 10*3/uL (ref 4.0–10.5)
nRBC: 0 % (ref 0.0–0.2)

## 2023-07-15 LAB — COMPREHENSIVE METABOLIC PANEL WITH GFR
ALT: 18 U/L (ref 0–44)
AST: 31 U/L (ref 15–41)
Albumin: 3.6 g/dL (ref 3.5–5.0)
Alkaline Phosphatase: 60 U/L (ref 38–126)
Anion gap: 12 (ref 5–15)
BUN: 14 mg/dL (ref 8–23)
CO2: 23 mmol/L (ref 22–32)
Calcium: 9.6 mg/dL (ref 8.9–10.3)
Chloride: 103 mmol/L (ref 98–111)
Creatinine, Ser: 0.96 mg/dL (ref 0.44–1.00)
GFR, Estimated: 60 mL/min — ABNORMAL LOW (ref >60)
Glucose, Bld: 239 mg/dL — ABNORMAL HIGH (ref 70–99)
Potassium: 3.3 mmol/L — ABNORMAL LOW (ref 3.5–5.1)
Sodium: 138 mmol/L (ref 135–145)
Total Bilirubin: 1.3 mg/dL — ABNORMAL HIGH (ref 0.0–1.2)
Total Protein: 7 g/dL (ref 6.5–8.1)

## 2023-07-15 LAB — LIPASE, BLOOD: Lipase: 31 U/L (ref 11–51)

## 2023-07-15 LAB — HEPARIN LEVEL (UNFRACTIONATED): Heparin Unfractionated: 0.93 [IU]/mL — ABNORMAL HIGH (ref 0.30–0.70)

## 2023-07-15 LAB — PROTIME-INR
INR: 1.2 (ref 0.8–1.2)
Prothrombin Time: 15.7 s — ABNORMAL HIGH (ref 11.4–15.2)

## 2023-07-15 LAB — APTT: aPTT: 103 s — ABNORMAL HIGH (ref 24–36)

## 2023-07-15 LAB — TROPONIN I (HIGH SENSITIVITY)
Troponin I (High Sensitivity): 11 ng/L (ref <18)
Troponin I (High Sensitivity): 13 ng/L (ref <18)

## 2023-07-15 MED ORDER — BACITRACIN-NEOMYCIN-POLYMYXIN OINTMENT TUBE
TOPICAL_OINTMENT | Freq: Every day | CUTANEOUS | Status: DC
  Filled 2023-07-15 (×2): qty 14.17

## 2023-07-15 MED ORDER — LOSARTAN POTASSIUM 50 MG PO TABS: 50.0000 mg | ORAL_TABLET | Freq: Every day | ORAL | Status: DC

## 2023-07-15 MED ORDER — HEPARIN BOLUS VIA INFUSION
4550.0000 [IU] | Freq: Once | INTRAVENOUS | Status: AC
  Administered 2023-07-15: 4550 [IU] via INTRAVENOUS
  Filled 2023-07-15: qty 4550

## 2023-07-15 MED ORDER — ATORVASTATIN CALCIUM 20 MG PO TABS
40.0000 mg | ORAL_TABLET | Freq: Every day | ORAL | Status: DC
  Administered 2023-07-15 – 2023-07-16 (×2): 40 mg via ORAL
  Filled 2023-07-15 (×2): qty 2

## 2023-07-15 MED ORDER — HYDROMORPHONE HCL 1 MG/ML IJ SOLN: 0.5000 mg | INTRAMUSCULAR | Status: DC | PRN

## 2023-07-15 MED ORDER — SODIUM CHLORIDE 0.9 % IV SOLN: INTRAVENOUS | Status: AC

## 2023-07-15 MED ORDER — PIPERACILLIN-TAZOBACTAM 3.375 G IVPB
3.3750 g | Freq: Once | INTRAVENOUS | Status: AC
  Administered 2023-07-15: 3.375 g via INTRAVENOUS

## 2023-07-15 MED ORDER — ACETAMINOPHEN 500 MG PO TABS: 500.0000 mg | ORAL_TABLET | Freq: Every day | ORAL | Status: DC | PRN

## 2023-07-15 MED ORDER — LETROZOLE 2.5 MG PO TABS
2.5000 mg | ORAL_TABLET | Freq: Every day | ORAL | Status: DC
  Administered 2023-07-15 – 2023-07-17 (×3): 2.5 mg via ORAL
  Filled 2023-07-15 (×3): qty 1

## 2023-07-15 MED ORDER — HEPARIN (PORCINE) 25000 UT/250ML-% IV SOLN
950.0000 [IU]/h | INTRAVENOUS | Status: DC
  Administered 2023-07-15: 1150 [IU]/h via INTRAVENOUS
  Administered 2023-07-16: 950 [IU]/h via INTRAVENOUS
  Filled 2023-07-15 (×2): qty 250

## 2023-07-15 MED ORDER — PIPERACILLIN-TAZOBACTAM 3.375 G IVPB 30 MIN
3.3750 g | Freq: Once | INTRAVENOUS | Status: DC
  Filled 2023-07-15: qty 50

## 2023-07-15 MED ORDER — POTASSIUM CHLORIDE CRYS ER 20 MEQ PO TBCR
40.0000 meq | EXTENDED_RELEASE_TABLET | Freq: Once | ORAL | Status: AC
  Administered 2023-07-15: 40 meq via ORAL
  Filled 2023-07-15: qty 2

## 2023-07-15 MED ORDER — ONDANSETRON HCL 4 MG/2ML IJ SOLN: 4.0000 mg | Freq: Four times a day (QID) | INTRAMUSCULAR | Status: DC | PRN

## 2023-07-15 MED ORDER — AMLODIPINE BESYLATE 5 MG PO TABS: 10.0000 mg | ORAL_TABLET | Freq: Every day | ORAL | Status: DC

## 2023-07-15 MED ORDER — METOPROLOL SUCCINATE ER 25 MG PO TB24
25.0000 mg | ORAL_TABLET | Freq: Every day | ORAL | Status: DC
  Administered 2023-07-15 – 2023-07-17 (×3): 25 mg via ORAL
  Filled 2023-07-15 (×3): qty 1

## 2023-07-15 MED ORDER — IOHEXOL 350 MG/ML SOLN
100.0000 mL | Freq: Once | INTRAVENOUS | Status: AC | PRN
  Administered 2023-07-15: 100 mL via INTRAVENOUS

## 2023-07-15 MED ORDER — ASPIRIN 81 MG PO TBEC
81.0000 mg | DELAYED_RELEASE_TABLET | Freq: Every day | ORAL | Status: DC
  Administered 2023-07-17: 81 mg via ORAL
  Filled 2023-07-15 (×3): qty 1

## 2023-07-15 MED ORDER — ONDANSETRON HCL 4 MG PO TABS: 4.0000 mg | ORAL_TABLET | Freq: Four times a day (QID) | ORAL | Status: DC | PRN

## 2023-07-15 MED ORDER — PIPERACILLIN-TAZOBACTAM 3.375 G IVPB
3.3750 g | Freq: Three times a day (TID) | INTRAVENOUS | Status: DC
  Administered 2023-07-15 – 2023-07-17 (×6): 3.375 g via INTRAVENOUS
  Filled 2023-07-15 (×6): qty 50

## 2023-07-15 MED ORDER — NON FORMULARY: 75.0000 mg | Freq: Every day | Status: DC

## 2023-07-15 MED ORDER — ISOSORBIDE MONONITRATE ER 30 MG PO TB24
30.0000 mg | ORAL_TABLET | Freq: Every day | ORAL | Status: DC
  Administered 2023-07-15 – 2023-07-17 (×3): 30 mg via ORAL
  Filled 2023-07-15 (×3): qty 1

## 2023-07-15 MED ORDER — HYDRALAZINE HCL 20 MG/ML IJ SOLN: 5.0000 mg | Freq: Four times a day (QID) | INTRAMUSCULAR | Status: DC | PRN

## 2023-07-15 NOTE — Consult Note (Signed)
 WOC Nurse Consult Note: Reason for Consult: Consult requested for right toe. Pt had toenail removed yesterday by Dr Lilian Kapur of the podiatry service.  He has ordered Neosporin dressings Q day. Soaked previously dressing with NS to remove slowly and reduce discomfort. Right anterior great toe with red moist full thickness wound where previous toenail was removed.  Small amt yellow drainage and edema, painful to touch, .4X.4X.2cm.  Dressing procedure/placement/frequency: continue present plan of care as previously ordered.  Topical treatment orders provided for bedside nurses to perform as follows: Apply Neosporin to right toe wound Q day, then cover with foam dressing. Pt should follow-up with the podiatry team after discharge.  Please re-consult if further assistance is needed.  Thank-you,  Cammie Mcgee MSN, RN, CWOCN, [place], CNS [phone]

## 2023-07-15 NOTE — Consult Note (Signed)
 Pharmacy Antibiotic Note  Victoria Steele is a 83 y.o. female admitted on 07/15/2023 with  acute PE and cholecystitis .  Pharmacy has been consulted for Zosyn dosing.  Plan:  Zosyn 3.375g IV q8h (4 hour infusion).  Temp (24hrs), Avg:97.9 F (36.6 C), Min:97.9 F (36.6 C), Max:97.9 F (36.6 C)  Recent Labs  Lab 07/15/23 0208  WBC 4.4  CREATININE 0.96    Estimated Creatinine Clearance: 48.3 mL/min (by C-G formula based on SCr of 0.96 mg/dL).    Allergies  Allergen Reactions   Codeine Rash    hallucinations    Antimicrobials this admission: Zosyn 4/3 >>   Dose adjustments this admission: N/A  Microbiology results: N/A  Thank you for allowing pharmacy to be a part of this patient's care.  Tressie Ellis 07/15/2023 8:28 AM

## 2023-07-15 NOTE — Consult Note (Signed)
 Subjective:   CC: Acute cholecystitis  HPI:  Victoria Steele is a 83 y.o. female who is consulted by Katrinka Blazing for evaluation of above cc.  Initially presented to the ED for chest pain.  History of metastatic breast cancer recently on anticoagulations but discontinued few months ago.  Workup noted PE on CT angio with incidental finding of gallbladder wall thickening and gallstones.  For previous notes, patient has not been reporting any specific right upper quadrant pain.   Past Medical History:  has a past medical history of Coronary artery disease involving native coronary artery (08/30/2021), Diastolic dysfunction, DVT (deep venous thrombosis) (HCC), Hypertension, Left Breast cancer (HCC), and Port catheter in place (09/14/2016).  Past Surgical History:  has a past surgical history that includes Abdominal hysterectomy; Portacath placement (Right, 09/10/2016); Appendectomy; Breast biopsy; Cyst excision (Right, 09/2016); PERIPHERAL VASCULAR THROMBECTOMY (Right, 08/30/2017); Lower Extremity Angiography (Right, 11/11/2017); IVC FILTER REMOVAL (N/A, 01/17/2018); CT CTA CORONARY W/CA SCORE W/CM &/OR WO/CM (09/01/2021); LEFT HEART CATH AND CORONARY ANGIOGRAPHY (N/A, 10/16/2021); and CORONARY STENT INTERVENTION (N/A, 10/16/2021).  Family History: family history includes Bladder Cancer in her father; Heart disease in her father and maternal grandfather; Lung cancer in her father; Parkinson's disease in her mother.  Social History:  reports that she has never smoked. She has never used smokeless tobacco. She reports that she does not drink alcohol and does not use drugs.  Current Medications:  Prior to Admission medications   Medication Sig Start Date End Date Taking? Authorizing Provider  acetaminophen (TYLENOL) 500 MG tablet Take 500 mg by mouth daily as needed for moderate pain or headache.     [provider]  amLODipine (NORVASC) 10 MG tablet Take 10 mg by mouth daily. 06/29/22   [provider]  atorvastatin (LIPITOR) 20 MG tablet Take 20 mg by mouth daily. 10/08/21   [provider]  atorvastatin (LIPITOR) 40 MG tablet Take 40 mg by mouth at bedtime. 03/17/21   [provider]  Calcium Carb-Cholecalciferol (CALCIUM 600/VITAMIN D3 PO) Take 1 tablet by mouth daily.    [provider]  chlorhexidine (PERIDEX) 0.12 % solution SMARTSIG:0.5 Ounce(s) By Mouth Morning-Night 12/29/22   [provider]  ELIQUIS 5 MG TABS tablet Take 1 tablet by mouth twice daily Patient not taking: Reported on 05/07/2023 04/16/23   Alinda Dooms, NP  isosorbide mononitrate (IMDUR) 30 MG 24 hr tablet Take 1 tablet (30 mg total) by mouth daily. 01/19/23   Marykay Lex, MD  letrozole [facility]) 2.5 MG tablet Take 1 tablet by mouth once daily 12/06/22   Creig Hines, MD  lidocaine-prilocaine (EMLA) cream Apply 1 application. topically as needed (port access). 07/09/21   Creig Hines, MD  losartan (COZAAR) 50 MG tablet Take 1 tablet by mouth once a day  for high blood pressure- to replace 100 mg 03/26/20   [provider]  metoprolol succinate (TOPROL-XL) 25 MG 24 hr tablet Take 1 tablet by mouth once daily 01/25/23   Marykay Lex, MD  nitroGLYCERIN (NITROSTAT) 0.4 MG SL tablet Place 1 tablet (0.4 mg total) under the tongue every 5 (five) minutes x 3 doses as needed for chest pain. 10/17/21   Sunnie Nielsen, DO  ondansetron (ZOFRAN) 4 MG tablet TAKE 1 TABLET BY MOUTH EVERY 8 HOURS AS NEEDED FOR NAUSEA OR VOMITING 04/16/23   Alinda Dooms, NP  palbociclib [facility]) 75 MG tablet Take 1 tablet (75 mg total) by mouth daily. Take for 21 days on,  7 days off, repeat every 28 days. 05/28/23   Creig Hines, MD    Allergies:  Allergies as of 07/15/2023 - Reviewed 07/15/2023  Allergen Reaction Noted   Codeine Rash 06/24/2021    ROS:  General: Denies weight loss, weight gain, fatigue, fevers, chills, and night sweats. Eyes: Denies blurry vision, double  vision, eye pain, itchy eyes, and tearing. Ears: Denies hearing loss, earache, and ringing in ears. Nose: Denies sinus pain, congestion, infections, runny nose, and nosebleeds. Mouth/throat: Denies hoarseness, sore throat, bleeding gums, and difficulty swallowing. Heart: Denies chest pain, palpitations, racing heart, irregular heartbeat, leg pain or swelling, and decreased activity tolerance. Respiratory: Denies breathing difficulty, shortness of breath, wheezing, cough, and sputum. GI: Denies change in appetite, heartburn, nausea, vomiting, constipation, diarrhea, and blood in stool. GU: Denies difficulty urinating, pain with urinating, urgency, frequency, blood in urine. Musculoskeletal: Denies joint stiffness, pain, swelling, muscle weakness. Skin: Denies rash, itching, mass, tumors, sores, and boils Neurologic: Denies headache, fainting, dizziness, seizures, numbness, and tingling. Psychiatric: Denies depression, anxiety, difficulty sleeping, and memory loss. Endocrine: Denies heat or cold intolerance, and increased thirst or urination. Blood/lymph: Denies easy bruising, and swollen glands     Objective:     BP (!) 168/55   Pulse 77   Temp 97.9 F (36.6 C) (Oral)   Resp (!) 21   SpO2 100%    Constitutional :  alert, cooperative, appears stated age, and no distress  Lymphatics/Throat:  no asymmetry, masses, or scars  Respiratory:  clear to auscultation bilaterally  Cardiovascular:  regular rate and rhythm  Gastrointestinal: soft, non-tender; bowel sounds normal; no masses,  no organomegaly.   Musculoskeletal: Steady movement  Skin: Cool and moist  Psychiatric: Normal affect, non-agitated, not confused       LABS:     Latest Ref Rng & Units 07/15/2023    2:08 AM 05/07/2023   11:08 AM 04/22/2023    3:57 PM  CMP  Glucose 70 - 99 mg/dL 324  401    BUN 8 - 23 mg/dL 14  15    Creatinine 0.27 - 1.00 mg/dL 2.53  6.64  4.03   Sodium 135 - 145 mmol/L 138  136    Potassium 3.5 -  5.1 mmol/L 3.3  3.5    Chloride 98 - 111 mmol/L 103  103    CO2 22 - 32 mmol/L 23  21    Calcium 8.9 - 10.3 mg/dL 9.6  9.3    Total Protein 6.5 - 8.1 g/dL 7.0  7.0    Total Bilirubin 0.0 - 1.2 mg/dL 1.3  1.2    Alkaline Phos 38 - 126 U/L 60  59    AST 15 - 41 U/L 31  29    ALT 0 - 44 U/L 18  15        Latest Ref Rng & Units 07/15/2023    2:08 AM 05/07/2023   11:08 AM 01/20/2023    3:58 PM  CBC  WBC 4.0 - 10.5 K/uL 4.4  2.5  2.4   Hemoglobin 12.0 - 15.0 g/dL 47.4  25.9  56.3   Hematocrit 36.0 - 46.0 % 33.5  33.9  35.6   Platelets 150 - 400 K/uL 206  138  168      RADS: CLINICAL DATA:  83 year old female with chest pain. Metastatic breast cancer.   EXAM: CT ANGIOGRAPHY CHEST, ABDOMEN AND PELVIS   TECHNIQUE: Non-contrast CT of the chest was initially obtained.   Multidetector CT imaging  through the chest, abdomen and pelvis was performed using the standard protocol during bolus administration of intravenous contrast. Multiplanar reconstructed images and MIPs were obtained and reviewed to evaluate the vascular anatomy.   RADIATION DOSE REDUCTION: This exam was performed according to the departmental dose-optimization program which includes automated exposure control, adjustment of the mA and/or kV according to patient size and/or use of iterative reconstruction technique.   CONTRAST:  OMNIPAQUE IOHEXOL 350 MG/ML SOLN   COMPARISON:  Restaging  CT Chest, Abdomen, and Pelvis 04/22/2023.   FINDINGS: CTA CHEST FINDINGS   Cardiovascular: Calcified aortic atherosclerosis. Coronary artery calcified atherosclerosis and/or stents. Stable heart size. No pericardial effusion.   Following contrast negative for thoracic aortic aneurysm or dissection.   Pulmonary arteries are well opacified on this exam also. There is no central, saddle, or lobar pulmonary artery filling defect. But there is a right lower lobe segmental thrombus beginning on series 6, image 71 and  continuing toward the posterior basal segment (image 80). This appears partially occlusive. No other convincing pulmonary artery thrombus.   Right chest Port-A-Cath appears stable.   Mediastinum/Nodes: Negative for mediastinal mass or lymphadenopathy.   Lungs/Pleura: Similar lung volumes and ventilation to January. Some chronic interstitial lung disease suspected. Major airways remain patent. Mild mosaic attenuation which is most pronounced in the right lower lobe. No pleural effusion, infarct, consolidation. No suspicious pulmonary nodule.   Musculoskeletal: Diffuse sclerotic skeletal metastases redemonstrated. No acute pathologic fracture identified in the chest.   Review of the MIP images confirms the above findings.   CTA ABDOMEN AND PELVIS FINDINGS   VASCULAR   Chronic IVC calcification series 6, image 150 appears stable and inconsequential. Extensive Aortoiliac calcified atherosclerosis. Normal caliber abdominal aorta. Negative for abdominal aortic aneurysm. Major aortic branches, pelvic arteries remain patent despite the atherosclerosis. Proximal femoral arteries remain patent despite atherosclerosis.   Review of the MIP images confirms the above findings.   NON-VASCULAR   Hepatobiliary: Small chronic gallstone in the neck of the gallbladder and indistinct appearance of the gallbladder wall now with either wall thickening or pericholecystic edema (series 9, image 47 and series 6, image 139. Mildly heterogeneous enhancement of the liver but no convincing gallbladder fossa related hyperenhancement. No discrete liver metastasis.   Pancreas: Stable, negative.   Spleen: Stable, negative.   Adrenals/Urinary Tract: Stable, negative.   Stomach/Bowel: Extensive diverticulosis of the descending and sigmoid colon. Similar indistinct appearance of the sigmoid since January, no definite progression or mesenteric stranding. Probable appendectomy change series 6, image 177  at the cecum. No dilated large or small bowel. Small volume retained fluid in the stomach and duodenum. No free air, free fluid.   Lymphatic: No lymphadenopathy identified.   Reproductive: Absent uterus.  Normal ovaries.   Other: No pelvis free fluid.   Musculoskeletal: Diffuse sclerotic skeletal metastases redemonstrated. Stable discontinuity of the L4 anterior superior endplate which might be congenital limbus deformity. No acute pathologic fracture identified in the abdomen or pelvis.   Review of the MIP images confirms the above findings.   IMPRESSION: 1. Positive for a Right Lower Lobe Segmental Pulmonary Embolus. No lobar or central embolus. No pulmonary infarct or pleural effusion. 2. Positive also for chronic cholelithiasis with evidence of abnormal gallbladder wall thickening or pericholecystic edema suspicious for Acute Cholecystitis. 3. Advanced Aortic Atherosclerosis (ICD10-I70.0). Negative for aortic dissection or aneurysm. 4. Otherwise stable CT appearance of the Chest, Abdomen, and Pelvis since restaging in January. Including Diffuse skeletal metastases.   Electronically Signed:  By: Odessa Fleming M.D. On: 07/15/2023 05:52  CLINICAL DATA:  Wall thickening and a stone in the gallbladder on CTA chest, abdomen and pelvis.   EXAM: ULTRASOUND ABDOMEN LIMITED RIGHT UPPER QUADRANT   COMPARISON:  CTA chest, abdomen and pelvis earlier today.   FINDINGS: Gallbladder:   There is a 1.1 cm shadowing stone in the gallbladder neck. The technologist does not indicate whether or not this is impacted, but there is also free wall thickening to 8.5 mm, positive sonographic Murphy sign, and pericholecystic fluid, findings which may be seen with acute cholecystitis.   There are no other visible stones but there is moderate layering sludge in the lumen.   Common bile duct:   Diameter: 4.3 mm.  No intrahepatic bile duct dilatation.   Liver:   No focal lesion identified. The  liver is echogenic consistent with the steatosis noted on CT. The liver also had a mildly cirrhotic appearance on the CT but this is not well redemonstrated sonographically. Portal vein is patent on color Doppler imaging with normal direction of blood flow towards the liver. The portal vein is not dilated.   Other: No ascites is seen.   IMPRESSION: 1. 1.1 cm shadowing stone in the gallbladder neck with gallbladder wall thickening, positive sonographic Murphy sign, and pericholecystic fluid, findings which may be seen with acute cholecystitis. The liver appears at least mildly cirrhotic however, and some of the wall thickening could be due to hepatic dysfunction. Consultation with a surgeon is recommended. 2. No bile duct dilatation. 3. Hepatic steatosis.     Electronically Signed   By: Almira Bar M.D.   On: 07/15/2023 07:28 Assessment:      Acute cholecystitis PE Metastatic breast cancer  Plan:    No surgical intervention due to PE and need for anticoagulation, will monitor for on ABX for now, but may need to consider cholecystostomy tube based on clinical course.   Further care per primary for now  labs/images/medications/previous chart entries reviewed personally and relevant changes/updates noted above.

## 2023-07-15 NOTE — ED Provider Notes (Signed)
 [facility] Provider Note    Event Date/Time   First MD Initiated Contact with Patient 07/15/23 [phone]     (approximate)  History   Chief Complaint: Chest pain  HPI  Victoria Steele is a 83 y.o. female with a past medical history of CAD status post 2 stents, hypertension, presents to the emergency department for chest pain.  According to the patient since earlier this evening she has been experiencing central chest pain as well as 1 episode of vomiting.  Patient has been nauseated and feels like her abdomen is full of "gas" which she believes could be the cause of the pain.  EMS gave the patient nitroglycerin with no relief.  Here patient continues state diffuse pain across the chest.  No shortness of breath.  Physical Exam   Triage Vital Signs: ED Triage Vitals  Encounter Vitals Group     BP      Systolic BP Percentile      Diastolic BP Percentile      Pulse      Resp      Temp      Temp src      SpO2      Weight      Height      Head Circumference      Peak Flow      Pain Score      Pain Loc      Pain Education      Exclude from Growth Chart     Most recent vital signs: There were no vitals filed for this visit.  General: Awake, no distress.  CV:  Good peripheral perfusion.  Regular rate and rhythm  Resp:  Normal effort.  Equal breath sounds bilaterally.  Chest wall is nontender. Abd:  No distention.  Soft, nontender.  No rebound or guarding.  Benign abdomen.  ED Results / Procedures / Treatments   EKG  EKG viewed and interpreted by myself shows a normal sinus rhythm at 79 bpm with a widened QRS, normal axis, largely normal intervals besides slight QTc prolongation, nonspecific ST changes.  RSR prime consistent with right bundle branch block.  RADIOLOGY  I reviewed interpreted chest x-ray images.  Patient has mild opacities bilaterally possibly mild edema. Radiology is read the x-ray is cardiomegaly with vascular  congestion.   MEDICATIONS ORDERED IN ED: Medications - No data to display   IMPRESSION / MDM / ASSESSMENT AND PLAN / ED COURSE  I reviewed the triage vital signs and the nursing notes.  Patient's presentation is most consistent with acute presentation with potential threat to life or bodily function.  Patient presents to the emergency department for chest pain starting earlier this evening.  Overall patient appears well speaking in full sentences, no distress.  Continues to say moderate central chest pain.  No shortness of breath.  No pleuritic pain.  No cough or fever.  Patient states she has been feeling like there is gas in her abdomen which she believes is the cause of her pain.  She has a benign abdomen on exam.  We will check labs including CBC chemistry lipase and troponin.  Will treat pain and nausea while awaiting results.  Will obtain a chest x-ray as well.  Patient's lab work shows no significant findings, reassuring CBC, reassuring chemistry.  Normal LFTs, normal troponin.  Normal lipase.  We will repeat a troponin.  Patient is feeling better after pain medication.  Patient CT scan has resulted showing  what appears to be a pulmonary embolus as well as possible acute cholecystitis.  Patient has no focal abdominal pain in the right upper quadrant.  Patient states she was on anticoagulants until January when her doctor took her off of them.  We will start the patient on a heparin infusion we will obtain a right upper quadrant ultrasound to evaluate for acute cholecystitis.  If the ultrasound is negative patient will require admission to the hospital service for further workup and treatment of her pulmonary embolism.  If positive patient may require surgical consultation for acute cholecystitis.  Patient care signed out to oncoming provider ultrasound pending.  CRITICAL CARE Performed by: Minna Antis   Total critical care time: 30 minutes  Critical care time was exclusive of  separately billable procedures and treating other patients.  Critical care was necessary to treat or prevent imminent or life-threatening deterioration.  Critical care was time spent personally by me on the following activities: development of treatment plan with patient and/or surrogate as well as nursing, discussions with consultants, evaluation of patient's response to treatment, examination of patient, obtaining history from patient or surrogate, ordering and performing treatments and interventions, ordering and review of laboratory studies, ordering and review of radiographic studies, pulse oximetry and re-evaluation of patient's condition.   FINAL CLINICAL IMPRESSION(S) / ED DIAGNOSES   Chest pain Pulmonary embolism  Note:  This document was prepared using Dragon voice recognition software and may include unintentional dictation errors.   Minna Antis, MD 07/15/23 [phone]

## 2023-07-15 NOTE — Progress Notes (Signed)
 Prior-To-Admission Oral Chemotherapy for Treatment of Oncologic Disease   Order noted from Dr. Mikey College to continue prior-to-admission oral chemotherapy regimen of IBRANCE.  Procedure Per Pharmacy & Therapeutics Committee Policy: Orders for continuation of home oral chemotherapy for treatment of an oncologic disease will be held unless approved by an oncologist during current admission.    For patients receiving oncology care at [facility], inpatient pharmacist contacts patient's oncologist during regular office hours to review. If earlier review is medically necessary, attending physician consults [facility] on-call oncologist   For patients receiving oncology care outside of [facility], attending physician consults patient's oncologist to review. If this oncologist or their coverage cannot be reached, attending physician consults [facility] on-call oncologist   Oral chemotherapy continuation order is on hold pending oncologist review,  Dr. Sharyon Cable: MD OK to hold Victoria Steele PharmD Clinical Pharmacist 07/15/2023

## 2023-07-15 NOTE — ED Provider Notes (Signed)
 Clinical Course as of 07/15/23 0750  Thu Jul 15, 2023  0705 Patient received in signout from Dr. Lenard Lance pending RUQ ultrasound to evaluate for signs of acute cholecystitis.  CT with signs of acute PE.  She is on heparin drip.  Remote history of DVT in the setting of breast cancer 2018.  Discontinued Eliquis 3 months ago.  [DS]  0730 I update patient of u/s results.  We discussed having both acute PE as well as acute cholecystitis.  We discussed antibiotics, heparin drip, medical admission and surgical consultation and possible outcomes of the situation.  Answered her questions.  She is appreciative. [DS]  1610 I discuss with an OR scrub nurse. Dr. Tonna Boehringer is currently scrubbed in and busy. I give her an overview of this case and also send Dr. Tonna Boehringer a secure chat message with relevant patient info.  [DS]  9604 I consult with Dr. Chipper Herb who agrees to admit [DS]    Clinical Course User Index [DS] Delton Prairie, MD    .Critical Care  Performed by: Delton Prairie, MD Authorized by: Delton Prairie, MD   Critical care provider statement:    Critical care time (minutes):  30   Critical care time was exclusive of:  Separately billable procedures and treating other patients   Critical care was necessary to treat or prevent imminent or life-threatening deterioration of the following conditions:  Circulatory failure and cardiac failure   Critical care was time spent personally by me on the following activities:  Development of treatment plan with patient or surrogate, discussions with consultants, evaluation of patient's response to treatment, examination of patient, ordering and review of laboratory studies, ordering and review of radiographic studies, ordering and performing treatments and interventions, pulse oximetry, re-evaluation of patient's condition and review of old charts      Delton Prairie, MD 07/15/23 [phone]

## 2023-07-15 NOTE — Progress Notes (Signed)
 ANTICOAGULATION CONSULT NOTE  Pharmacy Consult for heparin infusion Indication: pulmonary embolus  Allergies  Allergen Reactions   Codeine Rash    hallucinations    Patient Measurements:   Heparin Dosing Weight: 70.4 kg  Vital Signs: BP: 137/83 (04/03 0323) Pulse Rate: 71 (04/03 0323)  Labs: Recent Labs    07/15/23 0208 07/15/23 0350  HGB 11.6*  --   HCT 33.5*  --   PLT 206  --   CREATININE 0.96  --   TROPONINIHS 11 13    Estimated Creatinine Clearance: 48.3 mL/min (by C-G formula based on SCr of 0.96 mg/dL).   Medical History: Past Medical History:  Diagnosis Date   Coronary artery disease involving native coronary artery 08/30/2021   a. 08/2021 Cor CTA: Cor Ca2+ = 788.  Sev mRCA dzs, Mod LAD and LCx dzs; b. 10/2021 PCI: LM nl, LAD 75/pm (3.0x15 Onyx Frontier DES), 67m, LCX 75p (2.5x18 Onyx Frontier DES), RCA 100p/m w/ R->R and L->R collats to distal vessel/RPDA.   Diastolic dysfunction    a. 09/2021 Echo: EF 60-65%, no rwma, GrI DD, nl RV fxn.   DVT (deep venous thrombosis) (HCC)    2019   Hypertension    Left Breast cancer (HCC)    Port catheter in place 09/14/2016   Placed 09/10/2016 RT chest.    Medications:  PTA Meds: Eliquis 5 mg BID, pt reported not taking 07/06/23 per progress note from Dr. Wyn Quaker.  Assessment: Pt is a 83 yo female presenting to ED found "Positive for a Right Lower Lobe Segmental Pulmonary Embolus."   Goal of Therapy:  Heparin level 0.3-0.7 units/ml aPTT 66-102 seconds Monitor platelets by anticoagulation protocol: Yes   Plan:  Bolus 4550 units x 1 Start heparin infusion at 1150 units/hr Will check baseline labs to confirm HL < 0.1 Will check HL in 8 hr after start of infusion CBC daily while on heparin  Otelia Sergeant, PharmD, [facility] 07/15/2023 6:10 AM

## 2023-07-15 NOTE — ED Notes (Signed)
 Pt ambulatory to bathroom

## 2023-07-15 NOTE — H&P (Signed)
 History and Physical    Victoria Steele XBJ:[id number] DOB: 01/04/1941 DOA: 07/15/2023  PCP: Oswaldo Conroy, MD (Confirm with patient/family/NH records and if not entered, this has to be entered at [facility] point of entry) Patient coming from: Home  I have personally briefly reviewed patient's old medical records in [facility]  Chief Complaint: Chest-epigastric pain, N/V, fever/chills  HPI: Victoria Steele is a 83 y.o. female with medical history significant of CAD s/p stenting x 3, DVT off Eliquis January 2025, breast cancer on hormonal-mmunotherapy, HTN, HLD, presented with worsening of chest pain-epigastric pain, nausea vomiting fever chills.  Symptoms started 7 days ago, patient started develop intermittent epigastric to lower chest cramping-like abdominal pain, associate with nausea but no vomiting, symptoms usually happens after eating meals.  Yesterday evening, patient started to have more severe symptoms 8-9/10 pressure-like pain in the lower chest and epigastric area, severe nauseous vomiting stomach content nonbloody none bile, then became dry heaves, associated with subjective fever and chills and severe sweating. "  Worries about heart attack", patient decided to come to ED.  Patient was diagnosed with DVT in 2018 and has been taking Eliquis until January 2025, when Eliquis was discontinued.  ED Course: Afebrile, nontachycardic nonhypotensive nonhypoxic.  CT abdomen pelvis showed incidental finding of right lower lobe segmental PE, no lobular or central embolus.  Positive findings for chronic cholelithiasis and thickening and pericholecystic edema suspicious for acute cholecystitis.  Blood work showed WBC 4.4, hemoglobin 11.6, K3.3, glucose 233.  Troponin negative x 2, EKG showed sinus rhythm no acute ST changes, chronic RBBB  Patient was given Zofran pain medication and Zosyn x 1 in the ED.  Review of Systems: As per HPI otherwise 14 point review of systems negative.    Past  Medical History:  Diagnosis Date   Coronary artery disease involving native coronary artery 08/30/2021   a. 08/2021 Cor CTA: Cor Ca2+ = 788.  Sev mRCA dzs, Mod LAD and LCx dzs; b. 10/2021 PCI: LM nl, LAD 75/pm (3.0x15 Onyx Frontier DES), 77m, LCX 75p (2.5x18 Onyx Frontier DES), RCA 100p/m w/ R->R and L->R collats to distal vessel/RPDA.   Diastolic dysfunction    a. 09/2021 Echo: EF 60-65%, no rwma, GrI DD, nl RV fxn.   DVT (deep venous thrombosis) (HCC)    2019   Hypertension    Left Breast cancer (HCC)    Port catheter in place 09/14/2016   Placed 09/10/2016 RT chest.    Past Surgical History:  Procedure Laterality Date   ABDOMINAL HYSTERECTOMY     APPENDECTOMY     BREAST BIOPSY     CORONARY STENT INTERVENTION N/A 10/16/2021   Procedure: CORONARY STENT INTERVENTION;  Surgeon: Marykay Lex, MD;  Location: [facility] INVASIVE CV LAB: Prox LAD 75%->0% Onyx Frontier DES 3 x 15 (3.3); Mid LCx 75% (prox to OM2) -> Onyx Frontier DES 2.5 x 18 -> tapered 2.8 to 2.6 mm   CT CTA CORONARY W/CA SCORE W/CM &/OR WO/CM  09/01/2021   Coronary Calcium Score 788.  Combination of calcified and noncalcified plaque-severe lesion in mid RCA (estimated greater than 70%).  Moderate LAD and LCx 50 to 69%). => CAD RADS 4 with severe stenosis suggested in the RCA.  FFRCT not submitted due to motion artifact.-Recommended cardiac catheterization.   CYST EXCISION Right 09/2016   Buttocks   IVC FILTER REMOVAL N/A 01/17/2018   Procedure: IVC FILTER REMOVAL;  Surgeon: Annice Needy, MD;  Location: [facility] INVASIVE CV LAB;  Service: Cardiovascular;  Laterality: N/A;   LEFT HEART CATH AND CORONARY ANGIOGRAPHY N/A 10/16/2021   Procedure: LEFT HEART CATH AND CORONARY ANGIOGRAPHY;  Surgeon: Marykay Lex, MD;  Location: [facility] INVASIVE CV LAB:: mRCA 100% CTO (L-R  & bridging collaterals); prox LAD 75%( DES PCI) - dLAD-rDPDA colleterals; mLCx 75% just proximal to the OM 2 (DES PCI).  Normal EDP.   LOWER EXTREMITY ANGIOGRAPHY Right  11/11/2017   Procedure: LOWER EXTREMITY ANGIOGRAPHY;  Surgeon: Renford Dills, MD;  Location: [facility] INVASIVE CV LAB;  Service: Cardiovascular;  Laterality: Right;   PERIPHERAL VASCULAR THROMBECTOMY Right 08/30/2017   Procedure: PERIPHERAL VASCULAR THROMBECTOMY;  Surgeon: Annice Needy, MD;  Location: [facility] INVASIVE CV LAB;  Service: Cardiovascular;  Laterality: Right;   PORTACATH PLACEMENT Right 09/10/2016   Procedure: INSERTION PORT-A-CATH;  Surgeon: Leafy Ro, MD;  Location: [facility] ORS;  Service: General;  Laterality: Right;     reports that she has never smoked. She has never used smokeless tobacco. She reports that she does not drink alcohol and does not use drugs.  Allergies  Allergen Reactions   Codeine Rash    hallucinations    Family History  Problem Relation Age of Onset   Parkinson's disease Mother    Heart disease Father    Bladder Cancer Father    Lung cancer Father    Heart disease Maternal Grandfather      Prior to Admission medications   Medication Sig Start Date End Date Taking? Authorizing Provider  acetaminophen (TYLENOL) 500 MG tablet Take 500 mg by mouth daily as needed for moderate pain or headache.     [provider]  amLODipine (NORVASC) 10 MG tablet Take 10 mg by mouth daily. 06/29/22   [provider]  atorvastatin (LIPITOR) 20 MG tablet Take 20 mg by mouth daily. 10/08/21   [provider]  atorvastatin (LIPITOR) 40 MG tablet Take 40 mg by mouth at bedtime. 03/17/21   [provider]  Calcium Carb-Cholecalciferol (CALCIUM 600/VITAMIN D3 PO) Take 1 tablet by mouth daily.    [provider]  chlorhexidine (PERIDEX) 0.12 % solution SMARTSIG:0.5 Ounce(s) By Mouth Morning-Night 12/29/22   [provider]  ELIQUIS 5 MG TABS tablet Take 1 tablet by mouth twice daily Patient not taking: Reported on 05/07/2023 04/16/23   Alinda Dooms, NP  isosorbide mononitrate (IMDUR) 30 MG 24 hr tablet Take 1 tablet (30 mg  total) by mouth daily. 01/19/23   Marykay Lex, MD  letrozole [facility]) 2.5 MG tablet Take 1 tablet by mouth once daily 12/06/22   Creig Hines, MD  lidocaine-prilocaine (EMLA) cream Apply 1 application. topically as needed (port access). 07/09/21   Creig Hines, MD  losartan (COZAAR) 50 MG tablet Take 1 tablet by mouth once a day  for high blood pressure- to replace 100 mg 03/26/20   [provider]  metoprolol succinate (TOPROL-XL) 25 MG 24 hr tablet Take 1 tablet by mouth once daily 01/25/23   Marykay Lex, MD  nitroGLYCERIN (NITROSTAT) 0.4 MG SL tablet Place 1 tablet (0.4 mg total) under the tongue every 5 (five) minutes x 3 doses as needed for chest pain. 10/17/21   Sunnie Nielsen, DO  ondansetron (ZOFRAN) 4 MG tablet TAKE 1 TABLET BY MOUTH EVERY 8 HOURS AS NEEDED FOR NAUSEA OR VOMITING 04/16/23   Alinda Dooms, NP  palbociclib [facility]) 75 MG tablet Take 1 tablet (75 mg total) by mouth daily. Take for 21 days on, 7  days off, repeat every 28 days. 05/28/23   Creig Hines, MD    Physical Exam: Vitals:   07/15/23 0430 07/15/23 0500 07/15/23 0618 07/15/23 0642  BP: 111/62 (!) 132/54  (!) 168/55  Pulse: 72 (!) 58 80 77  Resp: (!) 22 (!) 21 17 (!) 21  Temp:   97.9 F (36.6 C)   TempSrc:   Oral   SpO2: 98% 100%  100%    Constitutional: NAD, calm, comfortable Vitals:   07/15/23 0430 07/15/23 0500 07/15/23 0618 07/15/23 0642  BP: 111/62 (!) 132/54  (!) 168/55  Pulse: 72 (!) 58 80 77  Resp: (!) 22 (!) 21 17 (!) 21  Temp:   97.9 F (36.6 C)   TempSrc:   Oral   SpO2: 98% 100%  100%   Eyes: PERRL, lids and conjunctivae normal ENMT: Mucous membranes are moist. Posterior pharynx clear of any exudate or lesions.Normal dentition.  Neck: normal, supple, no masses, no thyromegaly Respiratory: clear to auscultation bilaterally, no wheezing, no crackles. Normal respiratory effort. No accessory muscle use.  Cardiovascular: Regular rate and rhythm, no murmurs / rubs / gallops.  No extremity edema. 2+ pedal pulses. No carotid bruits.  Abdomen: RUQ tenderness, no rebound no guarding, no masses palpated. No hepatosplenomegaly. Bowel sounds positive.  Musculoskeletal: no clubbing / cyanosis. No joint deformity upper and lower extremities. Good ROM, no contractures. Normal muscle tone.  Skin: no rashes, lesions, ulcers. No induration Neurologic: CN 2-12 grossly intact. Sensation intact, DTR normal. Strength 5/5 in all 4.  Psychiatric: Normal judgment and insight. Alert and oriented x 3. Normal mood.     Labs on Admission: I have personally reviewed following labs and imaging studies  CBC: Recent Labs  Lab 07/15/23 0208  WBC 4.4  NEUTROABS 3.4  HGB 11.6*  HCT 33.5*  MCV 108.1*  PLT 206   Basic Metabolic Panel: Recent Labs  Lab 07/15/23 0208  NA 138  K 3.3*  CL 103  CO2 23  GLUCOSE 239*  BUN 14  CREATININE 0.96  CALCIUM 9.6   GFR: Estimated Creatinine Clearance: 48.3 mL/min (by C-G formula based on SCr of 0.96 mg/dL). Liver Function Tests: Recent Labs  Lab 07/15/23 0208  AST 31  ALT 18  ALKPHOS 60  BILITOT 1.3*  PROT 7.0  ALBUMIN 3.6   Recent Labs  Lab 07/15/23 0208  LIPASE 31   No results for input(s): "AMMONIA" in the last 168 hours. Coagulation Profile: No results for input(s): "INR", "PROTIME" in the last 168 hours. Cardiac Enzymes: No results for input(s): "CKTOTAL", "CKMB", "CKMBINDEX", "TROPONINI" in the last 168 hours. BNP (last 3 results) No results for input(s): "PROBNP" in the last 8760 hours. HbA1C: No results for input(s): "HGBA1C" in the last 72 hours. CBG: No results for input(s): "GLUCAP" in the last 168 hours. Lipid Profile: No results for input(s): "CHOL", "HDL", "LDLCALC", "TRIG", "CHOLHDL", "LDLDIRECT" in the last 72 hours. Thyroid Function Tests: No results for input(s): "TSH", "T4TOTAL", "FREET4", "T3FREE", "THYROIDAB" in the last 72 hours. Anemia Panel: No results for input(s): "VITAMINB12", "FOLATE",  "FERRITIN", "TIBC", "IRON", "RETICCTPCT" in the last 72 hours. Urine analysis:    Component Value Date/Time   COLORURINE YELLOW (A) 10/09/2016 1526   APPEARANCEUR CLEAR (A) 10/09/2016 1526   LABSPEC 1.014 10/09/2016 1526   PHURINE 6.0 10/09/2016 1526   GLUCOSEU NEGATIVE 10/09/2016 1526   HGBUR SMALL (A) 10/09/2016 1526   BILIRUBINUR NEGATIVE 10/09/2016 1526   KETONESUR NEGATIVE 10/09/2016 1526   PROTEINUR NEGATIVE 10/09/2016  1526   NITRITE NEGATIVE 10/09/2016 1526   LEUKOCYTESUR NEGATIVE 10/09/2016 1526    Radiological Exams on Admission: US ABDOMEN LIMITED RUQ (LIVER/GB) Result Date: 07/15/2023 CLINICAL DATA:  Wall thickening and a stone in the gallbladder on CTA chest, abdomen and pelvis. EXAM: ULTRASOUND ABDOMEN LIMITED RIGHT UPPER QUADRANT COMPARISON:  CTA chest, abdomen and pelvis earlier today. FINDINGS: Gallbladder: There is a 1.1 cm shadowing stone in the gallbladder neck. The technologist does not indicate whether or not this is impacted, but there is also free wall thickening to 8.5 mm, positive sonographic Murphy sign, and pericholecystic fluid, findings which may be seen with acute cholecystitis. There are no other visible stones but there is moderate layering sludge in the lumen. Common bile duct: Diameter: 4.3 mm.  No intrahepatic bile duct dilatation. Liver: No focal lesion identified. The liver is echogenic consistent with the steatosis noted on CT. The liver also had a mildly cirrhotic appearance on the CT but this is not well redemonstrated sonographically. Portal vein is patent on color Doppler imaging with normal direction of blood flow towards the liver. The portal vein is not dilated. Other: No ascites is seen. IMPRESSION: 1. 1.1 cm shadowing stone in the gallbladder neck with gallbladder wall thickening, positive sonographic Murphy sign, and pericholecystic fluid, findings which may be seen with acute cholecystitis. The liver appears at least mildly cirrhotic however, and  some of the wall thickening could be due to hepatic dysfunction. Consultation with a surgeon is recommended. 2. No bile duct dilatation. 3. Hepatic steatosis. Electronically Signed   By: Almira Bar M.D.   On: 07/15/2023 07:28   CT Angio Chest/Abd/Pel for Dissection W and/or Wo Contrast Addendum Date: 07/15/2023 ADDENDUM REPORT: 07/15/2023 06:12 ADDENDUM: Study discussed by telephone with Dr. Caryn Bee PADUCHOWSKI on 07/15/2023 at 0600 hours. Electronically Signed   By: Odessa Fleming M.D.   On: 07/15/2023 06:12   Result Date: 07/15/2023 CLINICAL DATA:  83 year old female with chest pain. Metastatic breast cancer. EXAM: CT ANGIOGRAPHY CHEST, ABDOMEN AND PELVIS TECHNIQUE: Non-contrast CT of the chest was initially obtained. Multidetector CT imaging through the chest, abdomen and pelvis was performed using the standard protocol during bolus administration of intravenous contrast. Multiplanar reconstructed images and MIPs were obtained and reviewed to evaluate the vascular anatomy. RADIATION DOSE REDUCTION: This exam was performed according to the departmental dose-optimization program which includes automated exposure control, adjustment of the mA and/or kV according to patient size and/or use of iterative reconstruction technique. CONTRAST:  OMNIPAQUE IOHEXOL 350 MG/ML SOLN COMPARISON:  Restaging  CT Chest, Abdomen, and Pelvis 04/22/2023. FINDINGS: CTA CHEST FINDINGS Cardiovascular: Calcified aortic atherosclerosis. Coronary artery calcified atherosclerosis and/or stents. Stable heart size. No pericardial effusion. Following contrast negative for thoracic aortic aneurysm or dissection. Pulmonary arteries are well opacified on this exam also. There is no central, saddle, or lobar pulmonary artery filling defect. But there is a right lower lobe segmental thrombus beginning on series 6, image 71 and continuing toward the posterior basal segment (image 80). This appears partially occlusive. No other convincing pulmonary  artery thrombus. Right chest Port-A-Cath appears stable. Mediastinum/Nodes: Negative for mediastinal mass or lymphadenopathy. Lungs/Pleura: Similar lung volumes and ventilation to January. Some chronic interstitial lung disease suspected. Major airways remain patent. Mild mosaic attenuation which is most pronounced in the right lower lobe. No pleural effusion, infarct, consolidation. No suspicious pulmonary nodule. Musculoskeletal: Diffuse sclerotic skeletal metastases redemonstrated. No acute pathologic fracture identified in the chest. Review of the MIP images confirms the above findings.  CTA ABDOMEN AND PELVIS FINDINGS VASCULAR Chronic IVC calcification series 6, image 150 appears stable and inconsequential. Extensive Aortoiliac calcified atherosclerosis. Normal caliber abdominal aorta. Negative for abdominal aortic aneurysm. Major aortic branches, pelvic arteries remain patent despite the atherosclerosis. Proximal femoral arteries remain patent despite atherosclerosis. Review of the MIP images confirms the above findings. NON-VASCULAR Hepatobiliary: Small chronic gallstone in the neck of the gallbladder and indistinct appearance of the gallbladder wall now with either wall thickening or pericholecystic edema (series 9, image 47 and series 6, image 139. Mildly heterogeneous enhancement of the liver but no convincing gallbladder fossa related hyperenhancement. No discrete liver metastasis. Pancreas: Stable, negative. Spleen: Stable, negative. Adrenals/Urinary Tract: Stable, negative. Stomach/Bowel: Extensive diverticulosis of the descending and sigmoid colon. Similar indistinct appearance of the sigmoid since January, no definite progression or mesenteric stranding. Probable appendectomy change series 6, image 177 at the cecum. No dilated large or small bowel. Small volume retained fluid in the stomach and duodenum. No free air, free fluid. Lymphatic: No lymphadenopathy identified. Reproductive: Absent uterus.   Normal ovaries. Other: No pelvis free fluid. Musculoskeletal: Diffuse sclerotic skeletal metastases redemonstrated. Stable discontinuity of the L4 anterior superior endplate which might be congenital limbus deformity. No acute pathologic fracture identified in the abdomen or pelvis. Review of the MIP images confirms the above findings. IMPRESSION: 1. Positive for a Right Lower Lobe Segmental Pulmonary Embolus. No lobar or central embolus. No pulmonary infarct or pleural effusion. 2. Positive also for chronic cholelithiasis with evidence of abnormal gallbladder wall thickening or pericholecystic edema suspicious for Acute Cholecystitis. 3. Advanced Aortic Atherosclerosis (ICD10-I70.0). Negative for aortic dissection or aneurysm. 4. Otherwise stable CT appearance of the Chest, Abdomen, and Pelvis since restaging in January. Including Diffuse skeletal metastases. Electronically Signed: By: Odessa Fleming M.D. On: 07/15/2023 05:52   DG Chest 1 View Result Date: 07/15/2023 CLINICAL DATA:  Chest pain for several hours today. EXAM: CHEST  1 VIEW COMPARISON:  10/14/2021 and CT 04/22/2023 FINDINGS: Stable enlargement of the cardiomediastinal silhouette. Coronary stenting. Right chest wall Port-A-Cath tip in the mid SVC. Pulmonary vascular congestion. Scattered sclerotic osseous metastases. IMPRESSION: 1. Cardiomegaly and pulmonary vascular congestion. 2. Scattered sclerotic osseous metastases. Electronically Signed   By: Minerva Fester M.D.   On: 07/15/2023 03:38   DG Foot Complete Right Result Date: 07/14/2023 Please see detailed radiograph report in office note.   EKG: Independently reviewed.  Sinus rhythm, no acute ST changes, chronic RBBB  Assessment/Plan Principal Problem:   PE (pulmonary thromboembolism) (HCC) Active Problems:   Acute pulmonary embolism (HCC)   Acute cholecystitis  (please populate well all problems here in Problem List. (For example, if patient is on BP meds at home and you resume or decide  to hold them, it is a problem that needs to be her. 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  Chest pain appears to be referred pain from acute cholecystitis. -Most recent LHC was done less than 2 years ago in July 2023, and 2 stents placed in, mid LAD and LCx stents placement, RCA lesion was found to be bridged with L-R collaterals. -Continue aspirin statin -Echocardiogram  HTN -Blood pressure  elevated, resume metoprolol, hold off long-acting losartan and amlodipine -As needed hydralazine  Hypokalemia -P.o. replacement  Metastatic breast cancer -Continue immunotherapy, continue normal work    DVT prophylaxis: Heparin drip Code Status: Full code Family Communication: None at bedside Disposition Plan: Patient is sick with acute cholecystitis requiring inpatient surgical consultation and IV antibiotics and n.p.o., and PE requiring parenteral anticoagulation in the perisurgical window.  Expect more than 2 midnight hospital stay Consults called: General Surgery Admission status: PCU admit   Emeline General MD [facility] Pager [phone]  07/15/2023, 8:12 AM

## 2023-07-15 NOTE — Progress Notes (Signed)
 ANTICOAGULATION CONSULT NOTE  Pharmacy Consult for heparin infusion Indication: pulmonary embolus  Allergies  Allergen Reactions   Codeine Rash    hallucinations    Patient Measurements: Height: 5\' 2"  (157.5 cm) IBW/kg (Calculated) : 50.1 Heparin Dosing Weight: 70.4 kg  Vital Signs: Temp: 98 F (36.7 C) (04/03 1211) Temp Source: Oral (04/03 1211) BP: 156/58 (04/03 1211) Pulse Rate: 79 (04/03 1211)  Labs: Recent Labs    07/15/23 0208 07/15/23 0350 07/15/23 1500  HGB 11.6*  --   --   HCT 33.5*  --   --   PLT 206  --   --   APTT  --   --  103*  LABPROT  --   --  15.7*  INR  --   --  1.2  HEPARINUNFRC  --   --  0.93*  CREATININE 0.96  --   --   TROPONINIHS 11 13  --     Estimated Creatinine Clearance: 48.3 mL/min (by C-G formula based on SCr of 0.96 mg/dL).   Medical History: Past Medical History:  Diagnosis Date   Coronary artery disease involving native coronary artery 08/30/2021   a. 08/2021 Cor CTA: Cor Ca2+ = 788.  Sev mRCA dzs, Mod LAD and LCx dzs; b. 10/2021 PCI: LM nl, LAD 75/pm (3.0x15 Onyx Frontier DES), 17m, LCX 75p (2.5x18 Onyx Frontier DES), RCA 100p/m w/ R->R and L->R collats to distal vessel/RPDA.   Diastolic dysfunction    a. 09/2021 Echo: EF 60-65%, no rwma, GrI DD, nl RV fxn.   DVT (deep venous thrombosis) (HCC)    2019   Hypertension    Left Breast cancer (HCC)    Port catheter in place 09/14/2016   Placed 09/10/2016 RT chest.    Medications:  PTA Meds: Eliquis 5 mg BID, pt reported not taking 07/06/23 per progress note from Dr. Wyn Quaker.  Assessment: Pt is a 83 yo female presenting to ED found "Positive for a Right Lower Lobe Segmental Pulmonary Embolus." CBC is stable. Last fill of apixaban in 12/6, per ED provider note apixaban was stopped 3 months ago. Pt had a therapeutic heparin level in 10/2021 which was running at 700 units/hr.   4/3 1500 aPTT 103 HL 0.93  Goal of Therapy:  Heparin level 0.3-0.7 units/ml Monitor platelets by  anticoagulation protocol: Yes   Plan:  I do not there is false elevation in the heparin level. Will decrease heparin infusion to 950 units/hr. Recheck heparin level in 8 hours. CBC daily while on heparin.   Paschal Dopp, PharmD, 07/15/2023 4:02 PM

## 2023-07-16 ENCOUNTER — Other Ambulatory Visit (HOSPITAL_COMMUNITY): Payer: Self-pay

## 2023-07-16 ENCOUNTER — Inpatient Hospital Stay (HOSPITAL_COMMUNITY)
Admission: EM | Admit: 2023-07-16 | Discharge: 2023-07-16 | Disposition: A | Discharge status: Home / Self Care | Source: Home / Self Care | Attending: Internal Medicine | Admitting: Internal Medicine

## 2023-07-16 DIAGNOSIS — I2693 Single subsegmental pulmonary embolism without acute cor pulmonale: Secondary | ICD-10-CM

## 2023-07-16 DIAGNOSIS — I2699 Other pulmonary embolism without acute cor pulmonale: Secondary | ICD-10-CM | POA: Diagnosis not present

## 2023-07-16 LAB — CBC
HCT: 30.2 % — ABNORMAL LOW (ref 36.0–46.0)
Hemoglobin: 10.6 g/dL — ABNORMAL LOW (ref 12.0–15.0)
MCH: 37.7 pg — ABNORMAL HIGH (ref 26.0–34.0)
MCHC: 35.1 g/dL (ref 30.0–36.0)
MCV: 107.5 fL — ABNORMAL HIGH (ref 80.0–100.0)
Platelets: 207 10*3/uL (ref 150–400)
RBC: 2.81 MIL/uL — ABNORMAL LOW (ref 3.87–5.11)
RDW: 15.8 % — ABNORMAL HIGH (ref 11.5–15.5)
WBC: 3.3 10*3/uL — ABNORMAL LOW (ref 4.0–10.5)
nRBC: 0 % (ref 0.0–0.2)

## 2023-07-16 LAB — COMPREHENSIVE METABOLIC PANEL WITH GFR
ALT: 27 U/L (ref 0–44)
AST: 41 U/L (ref 15–41)
Albumin: 3 g/dL — ABNORMAL LOW (ref 3.5–5.0)
Alkaline Phosphatase: 51 U/L (ref 38–126)
Anion gap: 8 (ref 5–15)
BUN: 11 mg/dL (ref 8–23)
CO2: 25 mmol/L (ref 22–32)
Calcium: 8.4 mg/dL — ABNORMAL LOW (ref 8.9–10.3)
Chloride: 105 mmol/L (ref 98–111)
Creatinine, Ser: 1.01 mg/dL — ABNORMAL HIGH (ref 0.44–1.00)
GFR, Estimated: 56 mL/min — ABNORMAL LOW (ref >60)
Glucose, Bld: 113 mg/dL — ABNORMAL HIGH (ref 70–99)
Potassium: 3.8 mmol/L (ref 3.5–5.1)
Sodium: 138 mmol/L (ref 135–145)
Total Bilirubin: 1.3 mg/dL — ABNORMAL HIGH (ref 0.0–1.2)
Total Protein: 6.1 g/dL — ABNORMAL LOW (ref 6.5–8.1)

## 2023-07-16 LAB — ECHOCARDIOGRAM COMPLETE
AR max vel: 2.43 cm2
AV Area VTI: 2.5 cm2
AV Area mean vel: 2.53 cm2
AV Mean grad: 8 mmHg
AV Peak grad: 16 mmHg
Ao pk vel: 2 m/s
Area-P 1/2: 2.62 cm2
Calc EF: 68.4 %
Height: 62 in
MV VTI: 2.6 cm2
S' Lateral: 2.9 cm
Single Plane A2C EF: 64.6 %
Single Plane A4C EF: 71.9 %
Weight: 3120 [oz_av]

## 2023-07-16 LAB — HEPARIN LEVEL (UNFRACTIONATED)
Heparin Unfractionated: 0.49 [IU]/mL (ref 0.30–0.70)
Heparin Unfractionated: 0.68 [IU]/mL (ref 0.30–0.70)

## 2023-07-16 MED ORDER — HEPARIN (PORCINE) 25000 UT/250ML-% IV SOLN
950.0000 [IU]/h | INTRAVENOUS | Status: DC
  Administered 2023-07-16 – 2023-07-17 (×2): 950 [IU]/h via INTRAVENOUS
  Filled 2023-07-16: qty 250

## 2023-07-16 MED ORDER — APIXABAN 5 MG PO TABS: 5.0000 mg | ORAL_TABLET | Freq: Two times a day (BID) | ORAL | Status: DC

## 2023-07-16 MED ORDER — PALBOCICLIB 75 MG PO TABS
75.0000 mg | ORAL_TABLET | Freq: Every day | ORAL | Status: AC
  Administered 2023-07-16 – 2023-07-17 (×2): 75 mg via ORAL
  Filled 2023-07-16 (×4): qty 1

## 2023-07-16 MED ORDER — APIXABAN 5 MG PO TABS: 10.0000 mg | ORAL_TABLET | Freq: Two times a day (BID) | ORAL | Status: DC

## 2023-07-16 NOTE — Progress Notes (Signed)
 Progress Note   Patient: Victoria Steele:[id number] DOB: [birth year] DOA: 07/15/2023     1 DOS: the patient was seen and examined on 07/16/2023   Brief hospital course: "Victoria Steele is a 83 y.o. female with medical history significant of CAD s/p stenting x 3, DVT off Eliquis January 2025, breast cancer on hormonal-mmunotherapy, HTN, HLD, presented with worsening of chest pain-epigastric pain, nausea vomiting fever chills.   Symptoms started 7 days ago, patient started develop intermittent epigastric to lower chest cramping-like abdominal pain, associate with nausea but no vomiting, symptoms usually happens after eating meals.  Yesterday evening, patient started to have more severe symptoms 8-9/10 pressure-like pain in the lower chest and epigastric area, severe nauseous vomiting stomach content nonbloody none bile, then became dry heaves, associated with subjective fever and chills and severe sweating. "  Worries about heart attack", patient decided to come to ED.  Patient was diagnosed with DVT in 2018 and has been taking Eliquis until January 2025, when Eliquis was discontinued.   ED Course: Afebrile, nontachycardic nonhypotensive nonhypoxic.  CT abdomen pelvis showed incidental finding of right lower lobe segmental PE, no lobular or central embolus.  Positive findings for chronic cholelithiasis and thickening and pericholecystic edema suspicious for acute cholecystitis.  Blood work showed WBC 4.4, hemoglobin 11.6, K3.3, glucose 233.  Troponin negative x 2, EKG showed sinus rhythm no acute ST changes, chronic RBBB   Patient was given Zofran pain medication and Zosyn x 1 in the ED."  Further hospital course and management as outlined below.    Assessment and Plan:  Acute cholecystitis --Continue IV antibiotics - Zosyn --General Surgery following, recommend conservative mgmt with antibiotics since symptoms have resolved as of this AM. --Diet resumed --Antiemetics and pain control PRN    Acute RLL subsegmental PE RLE DVT --Echocardiogram pending follow --Continue IV heparin --Likely transition to Eliquis tomorrow --Pharm consult for cost - pt reported stopping Eliquis in Jan due to price increase being cost-prohibitive   CAD Chest pain - likely due to PE -Troponin negative x 2 and EKG showed no acute ST-T changes, ACS ruled out.  Chest pain appears to be referred pain from acute cholecystitis. -Most recent LHC was done less than 2 years ago in July 2023, and 2 stents placed in, mid LAD and LCx stents placement, RCA lesion was found to be bridged with L-R collaterals. -Continue aspirin statin -Echocardiogram   HTN - BP 120's to 140's systolic today --Metoprolol --Holding long-acting losartan and amlodipine --As needed hydralazine   Hypokalemia K 3.3 on admission was replaced --Monitor BMP   Metastatic breast cancer -Continue immunotherapy, continue letrozole  Right toe wound - s/p ingrown toenail removal recently --Dressing changes and Neosporin daily --Follow up with Podiatry as scheduled      Subjective: Pt seen in ED holding for a bed, husband at bedside. She denies chest pain or SOB.  No abdominal pain or N/V since eating breakfast earlier.  No fever/chills or other acute complaints.    Physical Exam: Vitals:   07/16/23 0930 07/16/23 1100 07/16/23 1155 07/16/23 1200  BP: 132/72 (!) 141/43  (!) 140/46  Pulse: 64 (!) 56  (!) 57  Resp: 18 (!) 30  (!) 21  Temp:   97.8 F (36.6 C)   TempSrc:   Oral   SpO2: 97% 99%  96%  Weight:      Height:       General exam: awake, alert, no acute distress HEENT: moist mucus membranes, hearing  grossly normal  Respiratory system: CTA, no wheezes, rales or rhonchi, normal respiratory effort. Cardiovascular system: normal S1/S2, RRR, no JVD, murmurs, rubs, gallops, no pedal edema.   Gastrointestinal system: soft, NT, ND, no HSM felt, +bowel sounds. Central nervous system: A&O x 3. no gross focal neurologic  deficits, normal speech Extremities: R great toe with dressing in place Skin: dry, intact, normal temperature Psychiatry: normal mood, congruent affect, judgement and insight appear normal   Data Reviewed:  Notable labs --  Glucose 113 Cr 1.01 Ca 8.4 Albumin 3.0 T bili 1.3 stable WBC 3.3 Hbg 10.6  Family Communication: husband at bedside  Disposition: Status is: Inpatient Remains inpatient appropriate because: on IV heparin pending echo and monitoring closely for stability of cholecystitis and respiratory status given PE.    Planned Discharge Destination: Home    Time spent: 45 minutes  Author: Pennie Banter, DO 07/16/2023 1:35 PM  For on call review [email or web address].

## 2023-07-16 NOTE — ED Notes (Signed)
 MD at bedside.

## 2023-07-16 NOTE — Progress Notes (Signed)
 ANTICOAGULATION CONSULT NOTE  Pharmacy Consult for apixaban Indication: pulmonary embolus  Allergies  Allergen Reactions   Codeine Rash    hallucinations    Patient Measurements: Height: 5\' 2"  (157.5 cm) Weight: 88.5 kg (195 lb) IBW/kg (Calculated) : 50.1  Vital Signs: Temp: 97.8 F (36.6 C) (04/04 1155) Temp Source: Oral (04/04 1155) BP: 140/46 (04/04 1200) Pulse Rate: 74 (04/04 1330)  Labs: Recent Labs    07/15/23 0208 07/15/23 0350 07/15/23 1500 07/16/23 0009 07/16/23 0629 07/16/23 0750  HGB 11.6*  --   --   --  10.6*  --   HCT 33.5*  --   --   --  30.2*  --   PLT 206  --   --   --  207  --   APTT  --   --  103*  --   --   --   LABPROT  --   --  15.7*  --   --   --   INR  --   --  1.2  --   --   --   HEPARINUNFRC  --   --  0.93* 0.68  --  0.49  CREATININE 0.96  --   --   --  1.01*  --   TROPONINIHS 11 13  --   --   --   --     Estimated Creatinine Clearance: 45.9 mL/min (A) (by C-G formula based on SCr of 1.01 mg/dL (H)).   Medical History: Past Medical History:  Diagnosis Date   Coronary artery disease involving native coronary artery 08/30/2021   a. 08/2021 Cor CTA: Cor Ca2+ = 788.  Sev mRCA dzs, Mod LAD and LCx dzs; b. 10/2021 PCI: LM nl, LAD 75/pm (3.0x15 Onyx Frontier DES), 19m, LCX 75p (2.5x18 Onyx Frontier DES), RCA 100p/m w/ R->R and L->R collats to distal vessel/RPDA.   Diastolic dysfunction    a. 09/2021 Echo: EF 60-65%, no rwma, GrI DD, nl RV fxn.   DVT (deep venous thrombosis) (HCC)    2019   Hypertension    Left Breast cancer (HCC)    Port catheter in place 09/14/2016   Placed 09/10/2016 RT chest.    Medications:  PTA Meds: Eliquis 5 mg BID, pt reported not taking 07/06/23 per progress note from Dr. Wyn Quaker.  Assessment: Pt is a 83 yo female presenting to ED found "Positive for a Right Lower Lobe Segmental Pulmonary Embolus." CBC is stable. Last fill of apixaban in 12/6, per ED provider note apixaban was stopped 3 months ago. Pt had a  therapeutic heparin level in 10/2021 which was running at 700 units/hr.    Goal of Therapy:  Heparin level 0.3-0.7 units/ml Monitor platelets by anticoagulation protocol: Yes   Plan:  Switching heparin to apixaban. Apixaban 10 mg BID x 7 days followed by 5 mg BID thereafter.   Paschal Dopp, PharmD Clinical Pharmacist 07/16/2023

## 2023-07-16 NOTE — Progress Notes (Signed)
 ANTICOAGULATION CONSULT NOTE  Pharmacy Consult for heparin infusion Indication: pulmonary embolus  Allergies  Allergen Reactions   Codeine Rash    hallucinations    Patient Measurements: Height: 5\' 2"  (157.5 cm) IBW/kg (Calculated) : 50.1 Heparin Dosing Weight: 70.4 kg  Vital Signs: Temp: 98 F (36.7 C) (04/03 2348) Temp Source: Oral (04/03 2348) BP: 124/49 (04/03 2348) Pulse Rate: 64 (04/03 2348)  Labs: Recent Labs    07/15/23 0208 07/15/23 0350 07/15/23 1500 07/16/23 0009  HGB 11.6*  --   --   --   HCT 33.5*  --   --   --   PLT 206  --   --   --   APTT  --   --  103*  --   LABPROT  --   --  15.7*  --   INR  --   --  1.2  --   HEPARINUNFRC  --   --  0.93* 0.68  CREATININE 0.96  --   --   --   TROPONINIHS 11 13  --   --     Estimated Creatinine Clearance: 48.3 mL/min (by C-G formula based on SCr of 0.96 mg/dL).   Medical History: Past Medical History:  Diagnosis Date   Coronary artery disease involving native coronary artery 08/30/2021   a. 08/2021 Cor CTA: Cor Ca2+ = 788.  Sev mRCA dzs, Mod LAD and LCx dzs; b. 10/2021 PCI: LM nl, LAD 75/pm (3.0x15 Onyx Frontier DES), 16m, LCX 75p (2.5x18 Onyx Frontier DES), RCA 100p/m w/ R->R and L->R collats to distal vessel/RPDA.   Diastolic dysfunction    a. 09/2021 Echo: EF 60-65%, no rwma, GrI DD, nl RV fxn.   DVT (deep venous thrombosis) (HCC)    2019   Hypertension    Left Breast cancer (HCC)    Port catheter in place 09/14/2016   Placed 09/10/2016 RT chest.    Medications:  PTA Meds: Eliquis 5 mg BID, pt reported not taking 07/06/23 per progress note from Dr. Wyn Quaker.  Assessment: Pt is a 83 yo female presenting to ED found "Positive for a Right Lower Lobe Segmental Pulmonary Embolus." CBC is stable. Last fill of apixaban in 12/6, per ED provider note apixaban was stopped 3 months ago. Pt had a therapeutic heparin level in 10/2021 which was running at 700 units/hr.   4/3 1500 aPTT 103 HL 0.93 4/4 0009 HL 0.68,  therapeutic x 1  Goal of Therapy:  Heparin level 0.3-0.7 units/ml Monitor platelets by anticoagulation protocol: Yes   Plan:  Continue heparin infusion at 950 units/hr.  Recheck heparin level in 8 hours to confirm.  CBC daily while on heparin.   Otelia Sergeant, PharmD, MBA 07/16/2023 1:32 AM

## 2023-07-16 NOTE — TOC Benefit Eligibility Note (Signed)
 Pharmacy Patient Advocate Encounter  Insurance verification completed.    The patient is insured through  [employer] Part D . Patient has Medicare and is not eligible for a copay card, but may be able to apply for patient assistance or Medicare RX Payment Plan (Patient Must reach out to their plan, if eligible for payment plan), if available.    Ran test claim for Eliquis 5mg  and the current 30 day co-pay is $0.  Ran test claim for Xarelto 20mg  and the current 30 day co-pay is $0.   This test claim was processed through [employer]- copay amounts may vary at other pharmacies due to [employer], or as the patient moves through the different stages of their insurance plan.

## 2023-07-16 NOTE — Progress Notes (Signed)
 Subjective:  CC: Victoria Steele is a 83 y.o. female  Hospital stay day 1,   PE, acute cholecystitis  HPI: No complaints this am.  Tolerated reg food, denies any N/V, abd pain  ROS:  General: Denies weight loss, weight gain, fatigue, fevers, chills, and night sweats. Heart: Denies chest pain, palpitations, racing heart, irregular heartbeat, leg pain or swelling, and decreased activity tolerance. Respiratory: Denies breathing difficulty, shortness of breath, wheezing, cough, and sputum. GI: Denies change in appetite, heartburn, nausea, vomiting, constipation, diarrhea, and blood in stool. GU: Denies difficulty urinating, pain with urinating, urgency, frequency, blood in urine.   Objective:   Temp:  [97.6 F (36.4 C)-98.3 F (36.8 C)] 98.3 F (36.8 C) (04/04 0747) Pulse Rate:  [56-79] 67 (04/04 0747) Resp:  [20-22] 20 (04/04 0747) BP: (122-156)/(46-103) 132/103 (04/04 0747) SpO2:  [96 %-100 %] 100 % (04/04 0747) Weight:  [88.5 kg] 88.5 kg (04/04 0717)     Height: 5\' 2"  (157.5 cm) Weight: 88.5 kg BMI (Calculated): 35.66   Intake/Output this shift:  No intake or output data in the 24 hours ending 07/16/23 0955  Constitutional :  alert, cooperative, appears stated age, and no distress  Respiratory:  clear to auscultation bilaterally  Cardiovascular:  regular rate and rhythm  Gastrointestinal: soft, non-tender; bowel sounds normal; no masses,  no organomegaly.   Skin: Cool and moist.   Psychiatric: Normal affect, non-agitated, not confused       LABS:     Latest Ref Rng & Units 07/16/2023    6:29 AM 07/15/2023    2:08 AM 05/07/2023   11:08 AM  CMP  Glucose 70 - 99 mg/dL 914  782  956   BUN 8 - 23 mg/dL 11  14  15    Creatinine 0.44 - 1.00 mg/dL 2.13  0.86  5.78   Sodium 135 - 145 mmol/L 138  138  136   Potassium 3.5 - 5.1 mmol/L 3.8  3.3  3.5   Chloride 98 - 111 mmol/L 105  103  103   CO2 22 - 32 mmol/L 25  23  21    Calcium 8.9 - 10.3 mg/dL 8.4  9.6  9.3   Total Protein 6.5 -  8.1 g/dL 6.1  7.0  7.0   Total Bilirubin 0.0 - 1.2 mg/dL 1.3  1.3  1.2   Alkaline Phos 38 - 126 U/L 51  60  59   AST 15 - 41 U/L 41  31  29   ALT 0 - 44 U/L 27  18  15        Latest Ref Rng & Units 07/16/2023    6:29 AM 07/15/2023    2:08 AM 05/07/2023   11:08 AM  CBC  WBC 4.0 - 10.5 K/uL 3.3  4.4  2.5   Hemoglobin 12.0 - 15.0 g/dL 46.9  62.9  52.8   Hematocrit 36.0 - 46.0 % 30.2  33.5  33.9   Platelets 150 - 400 K/uL 207  206  138     RADS: N/a Assessment:   PE, acute cholecystitis  Stable and symptoms seemed to have resolved.  Labs stable.  Due to PE and need for anticoagulation, will continue with IV abx for now.   labs/images/medications/previous chart entries reviewed personally and relevant changes/updates noted above.

## 2023-07-16 NOTE — ED Notes (Signed)
 Echo at bedside

## 2023-07-16 NOTE — Progress Notes (Signed)
 ANTICOAGULATION CONSULT NOTE  Pharmacy Consult for heparin infusion Indication: pulmonary embolus  Allergies  Allergen Reactions   Codeine Rash    hallucinations    Patient Measurements: Height: 5\' 2"  (157.5 cm) Weight: 88.5 kg (195 lb) IBW/kg (Calculated) : 50.1 Heparin Dosing Weight: 70.4 kg  Vital Signs: Temp: 98.3 F (36.8 C) (04/04 0747) Temp Source: Oral (04/04 0747) BP: 132/103 (04/04 0747) Pulse Rate: 67 (04/04 0747)  Labs: Recent Labs    07/15/23 0208 07/15/23 0350 07/15/23 1500 07/16/23 0009 07/16/23 0629 07/16/23 0750  HGB 11.6*  --   --   --  10.6*  --   HCT 33.5*  --   --   --  30.2*  --   PLT 206  --   --   --  207  --   APTT  --   --  103*  --   --   --   LABPROT  --   --  15.7*  --   --   --   INR  --   --  1.2  --   --   --   HEPARINUNFRC  --   --  0.93* 0.68  --  0.49  CREATININE 0.96  --   --   --  1.01*  --   TROPONINIHS 11 13  --   --   --   --     Estimated Creatinine Clearance: 45.9 mL/min (A) (by C-G formula based on SCr of 1.01 mg/dL (H)).   Medical History: Past Medical History:  Diagnosis Date   Coronary artery disease involving native coronary artery 08/30/2021   a. 08/2021 Cor CTA: Cor Ca2+ = 788.  Sev mRCA dzs, Mod LAD and LCx dzs; b. 10/2021 PCI: LM nl, LAD 75/pm (3.0x15 Onyx Frontier DES), 56m, LCX 75p (2.5x18 Onyx Frontier DES), RCA 100p/m w/ R->R and L->R collats to distal vessel/RPDA.   Diastolic dysfunction    a. 09/2021 Echo: EF 60-65%, no rwma, GrI DD, nl RV fxn.   DVT (deep venous thrombosis) (HCC)    2019   Hypertension    Left Breast cancer (HCC)    Port catheter in place 09/14/2016   Placed 09/10/2016 RT chest.    Medications:  PTA Meds: Eliquis 5 mg BID, pt reported not taking 07/06/23 per progress note from Dr. Wyn Quaker.  Assessment: Pt is a 83 yo female presenting to ED found "Positive for a Right Lower Lobe Segmental Pulmonary Embolus." CBC is stable. Last fill of apixaban in 12/6, per ED provider note apixaban was  stopped 3 months ago. Pt had a therapeutic heparin level in 10/2021 which was running at 700 units/hr.   4/3 1500 aPTT 103 HL 0.93 4/4 0009 HL 0.68, therapeutic x 1 4/4 0750 HL 0.49, therapeutic x 2  Goal of Therapy:  Heparin level 0.3-0.7 units/ml Monitor platelets by anticoagulation protocol: Yes   Plan:  Continue heparin infusion at 950 units/hr.  check heparin level with am labs CBC daily while on heparin.   Bari Mantis PharmD Clinical Pharmacist 07/16/2023

## 2023-07-16 NOTE — Plan of Care (Signed)

## 2023-07-16 NOTE — Progress Notes (Signed)
*  PRELIMINARY RESULTS* Echocardiogram 2D Echocardiogram has been performed.  Carolyne Fiscal 07/16/2023, 10:33 AM

## 2023-07-17 DIAGNOSIS — I2699 Other pulmonary embolism without acute cor pulmonale: Secondary | ICD-10-CM | POA: Diagnosis not present

## 2023-07-17 LAB — CBC
HCT: 28.4 % — ABNORMAL LOW (ref 36.0–46.0)
Hemoglobin: 10 g/dL — ABNORMAL LOW (ref 12.0–15.0)
MCH: 37.3 pg — ABNORMAL HIGH (ref 26.0–34.0)
MCHC: 35.2 g/dL (ref 30.0–36.0)
MCV: 106 fL — ABNORMAL HIGH (ref 80.0–100.0)
Platelets: 233 10*3/uL (ref 150–400)
RBC: 2.68 MIL/uL — ABNORMAL LOW (ref 3.87–5.11)
RDW: 15.5 % (ref 11.5–15.5)
WBC: 2.5 10*3/uL — ABNORMAL LOW (ref 4.0–10.5)
nRBC: 0 % (ref 0.0–0.2)

## 2023-07-17 LAB — HEPARIN LEVEL (UNFRACTIONATED): Heparin Unfractionated: 0.72 [IU]/mL — ABNORMAL HIGH (ref 0.30–0.70)

## 2023-07-17 MED ORDER — APIXABAN 5 MG PO TABS: 5.0000 mg | ORAL_TABLET | Freq: Two times a day (BID) | ORAL | Status: DC

## 2023-07-17 MED ORDER — BACITRACIN-NEOMYCIN-POLYMYXIN OINTMENT TUBE: 1.0000 | TOPICAL_OINTMENT | Freq: Every day | CUTANEOUS | Status: DC

## 2023-07-17 MED ORDER — APIXABAN 5 MG PO TABS
10.0000 mg | ORAL_TABLET | Freq: Two times a day (BID) | ORAL | Status: DC
  Administered 2023-07-17: 10 mg via ORAL
  Filled 2023-07-17: qty 2

## 2023-07-17 MED ORDER — APIXABAN (ELIQUIS) VTE STARTER PACK (10MG AND 5MG): ORAL_TABLET | ORAL | 0 refills | Status: DC

## 2023-07-17 MED ORDER — AMOXICILLIN-POT CLAVULANATE 875-125 MG PO TABS: 1.0000 | ORAL_TABLET | Freq: Two times a day (BID) | ORAL | 0 refills | Status: AC

## 2023-07-17 NOTE — Plan of Care (Signed)
  Problem: Education: Goal: Knowledge of General Education information will improve Description: Including pain rating scale, medication(s)/side effects and non-pharmacologic comfort measures Outcome: Progressing   Problem: Health Behavior/Discharge Planning: Goal: Ability to manage health-related needs will improve Outcome: Progressing   Problem: Clinical Measurements: Goal: Ability to maintain clinical measurements within normal limits will improve Outcome: Progressing Goal: Diagnostic test results will improve Outcome: Progressing Goal: Respiratory complications will improve Outcome: Progressing Goal: Cardiovascular complication will be avoided Outcome: Progressing   Problem: Activity: Goal: Risk for activity intolerance will decrease Outcome: Progressing   Problem: Nutrition: Goal: Adequate nutrition will be maintained Outcome: Progressing   Problem: Coping: Goal: Level of anxiety will decrease Outcome: Progressing   Problem: Elimination: Goal: Will not experience complications related to bowel motility Outcome: Progressing Goal: Will not experience complications related to urinary retention Outcome: Progressing   Problem: Pain Managment: Goal: General experience of comfort will improve and/or be controlled Outcome: Progressing   Problem: Safety: Goal: Ability to remain free from injury will improve Outcome: Progressing   Problem: Skin Integrity: Goal: Risk for impaired skin integrity will decrease Outcome: Progressing

## 2023-07-17 NOTE — Discharge Summary (Signed)
 Physician Discharge Summary   Patient: Victoria Steele MRN: [id number] DOB: 1940-10-20  Admit date:     07/15/2023  Discharge date: 07/17/23  Discharge Physician: Pennie Banter   PCP: Oswaldo Conroy, MD   Recommendations at discharge:    Follow up with Primary Care in 1 weeks Follow up as scheduled with Podiatry Repeat CBC, CMP at follow up  Discharge Diagnoses: Principal Problem:   PE (pulmonary thromboembolism) (HCC) Active Problems:   Acute pulmonary embolism (HCC)   Acute cholecystitis  Resolved Problems:   * No resolved hospital problems. *  Hospital Course:  "Victoria Steele is a 83 y.o. female with medical history significant of CAD s/p stenting x 3, DVT off Eliquis January 2025, breast cancer on hormonal-mmunotherapy, HTN, HLD, presented with worsening of chest pain-epigastric pain, nausea vomiting fever chills.   Symptoms started 7 days ago, patient started develop intermittent epigastric to lower chest cramping-like abdominal pain, associate with nausea but no vomiting, symptoms usually happens after eating meals.  Yesterday evening, patient started to have more severe symptoms 8-9/10 pressure-like pain in the lower chest and epigastric area, severe nauseous vomiting stomach content nonbloody none bile, then became dry heaves, associated with subjective fever and chills and severe sweating. "  Worries about heart attack", patient decided to come to ED.  Patient was diagnosed with DVT in 2018 and has been taking Eliquis until January 2025, when Eliquis was discontinued.   ED Course: Afebrile, nontachycardic nonhypotensive nonhypoxic.  CT abdomen pelvis showed incidental finding of right lower lobe segmental PE, no lobular or central embolus.  Positive findings for chronic cholelithiasis and thickening and pericholecystic edema suspicious for acute cholecystitis.  Blood work showed WBC 4.4, hemoglobin 11.6, K3.3, glucose 233.  Troponin negative x 2, EKG showed sinus rhythm  no acute ST changes, chronic RBBB   Patient was given Zofran pain medication and Zosyn x 1 in the ED."   Further hospital course and management as outlined below.   4/5 -- pt doing well today, no complaints. Seen with husband this AM.  No abdominal pain or nausea/vomiting and tolerating regular diet.   Pt transitioned off IV heparin to Eliquis this morning. Pt is medically stable and requests discharge home today.    Assessment and Plan:  Acute cholecystitis --Treated with empiric IV antibiotics - Zosyn --Discharge on PO Augmentin --General Surgery was consulted and recommend conservative mgmt with antibiotics since symptoms have resolved --Diet resumed -- tolerating well --Antiemetics and pain control PRN   Acute RLL subsegmental PE RLE DVT --Echocardiogram normal EF, no right heart strain --Treated IV heparin --Started PO Eliquis this AM -- 10 mg BID x 7 days, then 5 mg BID --PCP follow up   CAD Chest pain - likely due to PE -Troponin negative x 2 and EKG showed no acute ST-T changes, ACS ruled out.  Chest pain appears to be referred pain from acute cholecystitis. -Most recent LHC was done less than 2 years ago in July 2023, and 2 stents placed in, mid LAD and LCx stents placement, RCA lesion was found to be bridged with L-R collaterals. -Continue aspirin statin -Echocardiogram as above   HTN - BP 120's to 140's systolic today --Metoprolol --Holding long-acting losartan and amlodipine -- pt instructed to hold these  at d/c to prevent hypotension.  Home BP monitoring and close PCP follow up.  Okay to resume if home BP's above 130/80    Hypokalemia K 3.3 on admission was replaced --Monitor BMP  Metastatic breast cancer -Continue immunotherapy, continue letrozole   Right toe wound - s/p ingrown toenail removal recently --Dressing changes and Neosporin daily --Follow up with Podiatry as scheduled       Consultants: General surgery Procedures performed: Echo   Disposition: Home Diet recommendation:  Cardiac diet DISCHARGE MEDICATION: Allergies as of 07/17/2023       Reactions   Codeine Rash   hallucinations        Medication List     PAUSE taking these medications    amLODipine 10 MG tablet Wait to take this until your doctor or other care provider tells you to start again. Commonly known as: NORVASC Take 10 mg by mouth daily.   losartan 50 MG tablet Wait to take this until your doctor or other care provider tells you to start again. Commonly known as: COZAAR Take 1 tablet by mouth once a day  for high blood pressure- to replace 100 mg       STOP taking these medications    Eliquis 5 MG Tabs tablet Generic drug: apixaban Replaced by: Apixaban Starter Pack (10mg  and 5mg )       TAKE these medications    acetaminophen 500 MG tablet Commonly known as: TYLENOL Take 500 mg by mouth daily as needed for moderate pain or headache.   amoxicillin-clavulanate 875-125 MG tablet Commonly known as: AUGMENTIN Take 1 tablet by mouth 2 (two) times daily for 7 days.   Apixaban Starter Pack (10mg  and 5mg ) Commonly known as: ELIQUIS STARTER PACK Take as directed on package: start with two-5mg  tablets twice daily for 7 days. On day 8, switch to one-5mg  tablet twice daily. Replaces: Eliquis 5 MG Tabs tablet   atorvastatin 40 MG tablet Commonly known as: LIPITOR Take 40 mg by mouth at bedtime. What changed: Another medication with the same name was removed. Continue taking this medication, and follow the directions you see here.   CALCIUM 600/VITAMIN D3 PO Take 1 tablet by mouth daily.   chlorhexidine 0.12 % solution Commonly known as: PERIDEX Use as directed 15 mLs in the mouth or throat 2 (two) times daily. After breakfast and before bedtime   Ibrance 75 MG tablet Generic drug: palbociclib Take 1 tablet (75 mg total) by mouth daily. Take for 21 days on, 7 days off, repeat every 28 days.   isosorbide mononitrate 30 MG 24 hr  tablet Commonly known as: IMDUR Take 1 tablet (30 mg total) by mouth daily.   letrozole 2.5 MG tablet Commonly known as: FEMARA Take 1 tablet by mouth once daily   lidocaine-prilocaine cream Commonly known as: EMLA Apply 1 application. topically as needed (port access).   metoprolol succinate 25 MG 24 hr tablet Commonly known as: TOPROL-XL Take 1 tablet by mouth once daily   neomycin-bacitracin-polymyxin Oint Commonly known as: NEOSPORIN Apply 1 Application topically daily. Start taking on: July 18, 2023   nitroGLYCERIN 0.4 MG SL tablet Commonly known as: NITROSTAT Place 1 tablet (0.4 mg total) under the tongue every 5 (five) minutes x 3 doses as needed for chest pain.   ondansetron 4 MG tablet Commonly known as: ZOFRAN TAKE 1 TABLET BY MOUTH EVERY 8 HOURS AS NEEDED FOR NAUSEA OR VOMITING               Discharge Care Instructions  (From admission, onward)           Start     Ordered   07/17/23 0000  Discharge wound care:  Comments: Apply Neosporin to right toe wound and cover with dressing. Change daily.   07/17/23 1036            Discharge Exam: Filed Weights   07/16/23 0717  Weight: 88.5 kg   General exam: awake, alert, no acute distress HEENT: atraumatic, clear conjunctiva, anicteric sclera,moist mucus membranes, hearing grossly normal  Respiratory system: CTAB, no wheezes, rales or rhonchi, normal respiratory effort. Cardiovascular system: normal S1/S2, RRR, no JVD, murmurs, rubs, gallops,  no pedal edema.   Gastrointestinal system: soft, NT, ND, no HSM felt, +bowel sounds. Central nervous system: A&O x4. no gross focal neurologic deficits, normal speech Extremities: left great toe dressing in place, no edema, normal tone Skin: dry, intact, normal temperature, normal color, No rashes, lesions or ulcers Psychiatry: normal mood, congruent affect, judgement and insight appear normal   Condition at discharge: stable  The results of  significant diagnostics from this hospitalization (including imaging, microbiology, ancillary and laboratory) are listed below for reference.   Imaging Studies: ECHOCARDIOGRAM COMPLETE Result Date: 07/16/2023    ECHOCARDIOGRAM REPORT   Patient Name:   CHERYLEE RAWLINSON Date of Exam: 07/16/2023 Medical Rec #:  [id number]      Height:       62.0 in Accession #:    [id number]     Weight:       195.0 lb Date of Birth:  05-04-1940      BSA:          1.891 m Patient Age:    82 years       BP:           132/103 mmHg Patient Gender: F              HR:           62 bpm. Exam Location:  [facility] Procedure: 2D Echo, Cardiac Doppler, Color Doppler and Strain Analysis (Both            Spectral and Color Flow Doppler were utilized during procedure). Indications:     Pulmonary embolus  History:         Patient has prior history of Echocardiogram examinations, most                  recent 10/01/2021. Angina, CAD and Previous Myocardial                  Infarction, PAD, Signs/Symptoms:Dyspnea; Risk                  Factors:Hypertension. Breast CA.  Sonographer:     Mikki Harbor Referring Phys:  [id number] Emeline General Diagnosing Phys: Debbe Odea MD  Sonographer Comments: Global longitudinal strain was attempted. IMPRESSIONS  1. Left ventricular ejection fraction, by estimation, is 60 to 65%. Left ventricular ejection fraction by 2D MOD biplane is 68.4 %. Left ventricular ejection fraction by PLAX is 65 %. The left ventricle has normal function. The left ventricle has no regional wall motion abnormalities. There is mild left ventricular hypertrophy. Left ventricular diastolic parameters are consistent with Grade I diastolic dysfunction (impaired relaxation).  2. Right ventricular systolic function is normal. The right ventricular size is normal.  3. The mitral valve is normal in structure. No evidence of mitral valve regurgitation.  4. The aortic valve is grossly normal. Aortic valve regurgitation is not visualized.  5. The inferior  vena cava is normal in size with greater than 50% respiratory variability, suggesting right atrial pressure of 3 mmHg. FINDINGS  Left Ventricle: Left ventricular ejection fraction, by estimation, is 60 to 65%. Left ventricular ejection fraction by PLAX is 65 %. Left ventricular ejection fraction by 2D MOD biplane is 68.4 %. The left ventricle has normal function. The left ventricle has no regional wall motion abnormalities. Global longitudinal strain performed but not reported based on interpreter judgement due to suboptimal tracking. The left ventricular internal cavity size was normal in size. There is mild left ventricular hypertrophy. Left ventricular diastolic parameters are consistent with Grade I diastolic dysfunction (impaired relaxation). Right Ventricle: The right ventricular size is normal. No increase in right ventricular wall thickness. Right ventricular systolic function is normal. Left Atrium: Left atrial size was normal in size. Right Atrium: Right atrial size was normal in size. Pericardium: There is no evidence of pericardial effusion. Mitral Valve: The mitral valve is normal in structure. No evidence of mitral valve regurgitation. MV peak gradient, 7.8 mmHg. The mean mitral valve gradient is 3.0 mmHg. Tricuspid Valve: The tricuspid valve is normal in structure. Tricuspid valve regurgitation is trivial. Aortic Valve: The aortic valve is grossly normal. Aortic valve regurgitation is not visualized. Aortic valve mean gradient measures 8.0 mmHg. Aortic valve peak gradient measures 16.0 mmHg. Aortic valve area, by VTI measures 2.50 cm. Pulmonic Valve: The pulmonic valve was normal in structure. Pulmonic valve regurgitation is not visualized. Aorta: The aortic root is normal in size and structure. Venous: The inferior vena cava is normal in size with greater than 50% respiratory variability, suggesting right atrial pressure of 3 mmHg. IAS/Shunts: No atrial level shunt detected by color flow Doppler.   LEFT VENTRICLE PLAX 2D                        Biplane EF (MOD) LV EF:         Left            LV Biplane EF:   Left                ventricular                      ventricular                ejection                         ejection                fraction by                      fraction by                PLAX is 65                       2D MOD                %.                               biplane is LVIDd:         4.50 cm                          68.4 %. LVIDs:         2.90 cm LV PW:  1.40 cm         Diastology LV IVS:        1.10 cm         LV e' medial:    8.16 cm/s LVOT diam:     2.00 cm         LV E/e' medial:  9.4 LV SV:         115             LV e' lateral:   8.16 cm/s LV SV Index:   61              LV E/e' lateral: 9.4 LVOT Area:     3.14 cm  LV Volumes (MOD) LV vol d, MOD    48.9 ml A2C: LV vol d, MOD    81.1 ml A4C: LV vol s, MOD    17.3 ml A2C: LV vol s, MOD    22.8 ml A4C: LV SV MOD A2C:   31.6 ml LV SV MOD A4C:   81.1 ml LV SV MOD BP:    43.1 ml RIGHT VENTRICLE RV Basal diam:  3.55 cm RV Mid diam:    2.70 cm RV S prime:     13.10 cm/s TAPSE (M-mode): 3.0 cm LEFT ATRIUM             Index        RIGHT ATRIUM           Index LA diam:        4.20 cm 2.22 cm/m   RA Area:     14.50 cm LA Vol (A2C):   56.7 ml 29.98 ml/m  RA Volume:   32.50 ml  17.18 ml/m LA Vol (A4C):   52.8 ml 27.92 ml/m LA Biplane Vol: 56.0 ml 29.61 ml/m  AORTIC VALVE                     PULMONIC VALVE AV Area (Vmax):    2.43 cm      PV Vmax:       1.17 m/s AV Area (Vmean):   2.53 cm      PV Peak grad:  5.5 mmHg AV Area (VTI):     2.50 cm AV Vmax:           200.00 cm/s AV Vmean:          133.000 cm/s AV VTI:            0.462 m AV Peak Grad:      16.0 mmHg AV Mean Grad:      8.0 mmHg LVOT Vmax:         155.00 cm/s LVOT Vmean:        107.000 cm/s LVOT VTI:          0.367 m LVOT/AV VTI ratio: 0.79  AORTA Ao Root diam: 3.30 cm MITRAL VALVE MV Area (PHT): 2.62 cm     SHUNTS MV Area VTI:   2.60 cm     Systemic VTI:  0.37 m  MV Peak grad:  7.8 mmHg     Systemic Diam: 2.00 cm MV Mean grad:  3.0 mmHg MV Vmax:       1.40 m/s MV Vmean:      80.8 cm/s MV Decel Time: 290 msec MV E velocity: 76.50 cm/s MV A velocity: 119.00 cm/s MV E/A ratio:  0.64 Debbe Odea MD Electronically signed by Debbe Odea MD Signature Date/Time: 07/16/2023/10:37:34 AM    Final  US Venous Img Lower Bilateral (DVT) Result Date: 07/15/2023 CLINICAL DATA:  Pulmonary embolus on chest CTA earlier today. EXAM: BILATERAL LOWER EXTREMITY VENOUS DOPPLER ULTRASOUND TECHNIQUE: Gray-scale sonography with graded compression, as well as color Doppler and duplex ultrasound were performed to evaluate the lower extremity deep venous systems from the level of the common femoral vein and including the common femoral, femoral, profunda femoral, popliteal and calf veins including the posterior tibial, peroneal and gastrocnemius veins when visible. The superficial great saphenous vein was also interrogated. Spectral Doppler was utilized to evaluate flow at rest and with distal augmentation maneuvers in the common femoral, femoral and popliteal veins. COMPARISON:  Right lower extremity ultrasound 08/23/2017 FINDINGS: RIGHT LOWER EXTREMITY Common Femoral Vein: No evidence of thrombus. Normal compressibility, respiratory phasicity and response to augmentation. Saphenofemoral Junction: No evidence of thrombus. Normal compressibility and flow on color Doppler imaging. Profunda Femoral Vein: No evidence of thrombus. Normal compressibility and flow on color Doppler imaging. Femoral Vein: No evidence of thrombus. Normal compressibility, respiratory phasicity and response to augmentation. Popliteal Vein: Nonocclusive thrombus identified right popliteal vein with decreased compressibility. Patient had occlusive thrombus in this region on the previous study. Calf Veins: Occlusive thrombus in the posterior tibial vein. Peroneal vein could not be visualized. Other Findings:  None. LEFT  LOWER EXTREMITY Common Femoral Vein: No evidence of thrombus. Normal compressibility, respiratory phasicity and response to augmentation. Saphenofemoral Junction: No evidence of thrombus. Normal compressibility and flow on color Doppler imaging. Profunda Femoral Vein: No evidence of thrombus. Normal compressibility and flow on color Doppler imaging. Femoral Vein: No evidence of thrombus. Normal compressibility, respiratory phasicity and response to augmentation. Popliteal Vein: No evidence of thrombus. Normal compressibility, respiratory phasicity and response to augmentation. Calf Veins: No evidence of thrombus. Normal compressibility and flow on color Doppler imaging. Superficial Great Saphenous Vein: No evidence of thrombus. Normal compressibility. Venous Reflux:  None. Other Findings:  None. IMPRESSION: 1. Nonocclusive thrombus in the right popliteal vein. Patient had thrombus in this region on the previous study. There is occlusive thrombus in the right posterior tibial vein on the current study that was not present on the previous exam. As such, thrombus in the right posterior tibial vein is felt to be acute with probable acute nonocclusive thrombus in the right popliteal vein although residual chronic thrombus in this right popliteal vein is not entirely excluded. 2. No evidence for left lower extremity deep venous thrombosis. Electronically Signed   By: Kennith Center M.D.   On: 07/15/2023 09:16   US ABDOMEN LIMITED RUQ (LIVER/GB) Result Date: 07/15/2023 CLINICAL DATA:  Wall thickening and a stone in the gallbladder on CTA chest, abdomen and pelvis. EXAM: ULTRASOUND ABDOMEN LIMITED RIGHT UPPER QUADRANT COMPARISON:  CTA chest, abdomen and pelvis earlier today. FINDINGS: Gallbladder: There is a 1.1 cm shadowing stone in the gallbladder neck. The technologist does not indicate whether or not this is impacted, but there is also free wall thickening to 8.5 mm, positive sonographic Murphy sign, and  pericholecystic fluid, findings which may be seen with acute cholecystitis. There are no other visible stones but there is moderate layering sludge in the lumen. Common bile duct: Diameter: 4.3 mm.  No intrahepatic bile duct dilatation. Liver: No focal lesion identified. The liver is echogenic consistent with the steatosis noted on CT. The liver also had a mildly cirrhotic appearance on the CT but this is not well redemonstrated sonographically. Portal vein is patent on color Doppler imaging with normal direction of blood flow towards the  liver. The portal vein is not dilated. Other: No ascites is seen. IMPRESSION: 1. 1.1 cm shadowing stone in the gallbladder neck with gallbladder wall thickening, positive sonographic Murphy sign, and pericholecystic fluid, findings which may be seen with acute cholecystitis. The liver appears at least mildly cirrhotic however, and some of the wall thickening could be due to hepatic dysfunction. Consultation with a surgeon is recommended. 2. No bile duct dilatation. 3. Hepatic steatosis. Electronically Signed   By: Almira Bar M.D.   On: 07/15/2023 07:28   CT Angio Chest/Abd/Pel for Dissection W and/or Wo Contrast Addendum Date: 07/15/2023 ADDENDUM REPORT: 07/15/2023 06:12 ADDENDUM: Study discussed by telephone with Dr. Caryn Bee PADUCHOWSKI on 07/15/2023 at 0600 hours. Electronically Signed   By: Odessa Fleming M.D.   On: 07/15/2023 06:12   Result Date: 07/15/2023 CLINICAL DATA:  83 year old female with chest pain. Metastatic breast cancer. EXAM: CT ANGIOGRAPHY CHEST, ABDOMEN AND PELVIS TECHNIQUE: Non-contrast CT of the chest was initially obtained. Multidetector CT imaging through the chest, abdomen and pelvis was performed using the standard protocol during bolus administration of intravenous contrast. Multiplanar reconstructed images and MIPs were obtained and reviewed to evaluate the vascular anatomy. RADIATION DOSE REDUCTION: This exam was performed according to the departmental  dose-optimization program which includes automated exposure control, adjustment of the mA and/or kV according to patient size and/or use of iterative reconstruction technique. CONTRAST:  OMNIPAQUE IOHEXOL 350 MG/ML SOLN COMPARISON:  Restaging  CT Chest, Abdomen, and Pelvis 04/22/2023. FINDINGS: CTA CHEST FINDINGS Cardiovascular: Calcified aortic atherosclerosis. Coronary artery calcified atherosclerosis and/or stents. Stable heart size. No pericardial effusion. Following contrast negative for thoracic aortic aneurysm or dissection. Pulmonary arteries are well opacified on this exam also. There is no central, saddle, or lobar pulmonary artery filling defect. But there is a right lower lobe segmental thrombus beginning on series 6, image 71 and continuing toward the posterior basal segment (image 80). This appears partially occlusive. No other convincing pulmonary artery thrombus. Right chest Port-A-Cath appears stable. Mediastinum/Nodes: Negative for mediastinal mass or lymphadenopathy. Lungs/Pleura: Similar lung volumes and ventilation to January. Some chronic interstitial lung disease suspected. Major airways remain patent. Mild mosaic attenuation which is most pronounced in the right lower lobe. No pleural effusion, infarct, consolidation. No suspicious pulmonary nodule. Musculoskeletal: Diffuse sclerotic skeletal metastases redemonstrated. No acute pathologic fracture identified in the chest. Review of the MIP images confirms the above findings. CTA ABDOMEN AND PELVIS FINDINGS VASCULAR Chronic IVC calcification series 6, image 150 appears stable and inconsequential. Extensive Aortoiliac calcified atherosclerosis. Normal caliber abdominal aorta. Negative for abdominal aortic aneurysm. Major aortic branches, pelvic arteries remain patent despite the atherosclerosis. Proximal femoral arteries remain patent despite atherosclerosis. Review of the MIP images confirms the above findings. NON-VASCULAR  Hepatobiliary: Small chronic gallstone in the neck of the gallbladder and indistinct appearance of the gallbladder wall now with either wall thickening or pericholecystic edema (series 9, image 47 and series 6, image 139. Mildly heterogeneous enhancement of the liver but no convincing gallbladder fossa related hyperenhancement. No discrete liver metastasis. Pancreas: Stable, negative. Spleen: Stable, negative. Adrenals/Urinary Tract: Stable, negative. Stomach/Bowel: Extensive diverticulosis of the descending and sigmoid colon. Similar indistinct appearance of the sigmoid since January, no definite progression or mesenteric stranding. Probable appendectomy change series 6, image 177 at the cecum. No dilated large or small bowel. Small volume retained fluid in the stomach and duodenum. No free air, free fluid. Lymphatic: No lymphadenopathy identified. Reproductive: Absent uterus.  Normal ovaries. Other: No pelvis free fluid. Musculoskeletal:  Diffuse sclerotic skeletal metastases redemonstrated. Stable discontinuity of the L4 anterior superior endplate which might be congenital limbus deformity. No acute pathologic fracture identified in the abdomen or pelvis. Review of the MIP images confirms the above findings. IMPRESSION: 1. Positive for a Right Lower Lobe Segmental Pulmonary Embolus. No lobar or central embolus. No pulmonary infarct or pleural effusion. 2. Positive also for chronic cholelithiasis with evidence of abnormal gallbladder wall thickening or pericholecystic edema suspicious for Acute Cholecystitis. 3. Advanced Aortic Atherosclerosis (ICD10-I70.0). Negative for aortic dissection or aneurysm. 4. Otherwise stable CT appearance of the Chest, Abdomen, and Pelvis since restaging in January. Including Diffuse skeletal metastases. Electronically Signed: By: Odessa Fleming M.D. On: 07/15/2023 05:52   DG Chest 1 View Result Date: 07/15/2023 CLINICAL DATA:  Chest pain for several hours today. EXAM: CHEST  1 VIEW  COMPARISON:  10/14/2021 and CT 04/22/2023 FINDINGS: Stable enlargement of the cardiomediastinal silhouette. Coronary stenting. Right chest wall Port-A-Cath tip in the mid SVC. Pulmonary vascular congestion. Scattered sclerotic osseous metastases. IMPRESSION: 1. Cardiomegaly and pulmonary vascular congestion. 2. Scattered sclerotic osseous metastases. Electronically Signed   By: Minerva Fester M.D.   On: 07/15/2023 03:38   DG Foot Complete Right Result Date: 07/14/2023 Please see detailed radiograph report in office note.  VAS [place] ABI WITH/WO TBI Result Date: 07/08/2023  LOWER EXTREMITY DOPPLER STUDY Patient Name:  OLAMAE FERRARA  Date of Exam:   07/06/2023 Medical Rec #: [id number]       Accession #:    [id number] Date of Birth: 1940/12/19       Patient Gender: F Patient Age:   70 years Exam Location:  [facility] Procedure:      VAS [place] ABI WITH/WO TBI Referring Phys: Festus Barren --------------------------------------------------------------------------------  Indications: Claudication, peripheral artery disease, and Right foot              discoloration. High Risk Factors: Hypertension, coronary artery disease. Other Factors: Right great toe pain and discoloration which has mostly                resolved.  Vascular Interventions: 08/30/17: Right iliac, common femoral, femoral &                         popliteal vein thrombolysis/thrombectomy/PTA. Comparison Study: 09/30/2022 Performing Technologist: Debbe Bales RVS  Examination Guidelines: A complete evaluation includes at minimum, Doppler waveform signals and systolic blood pressure reading at the level of bilateral brachial, anterior tibial, and posterior tibial arteries, when vessel segments are accessible. Bilateral testing is considered an integral part of a complete examination. Photoelectric Plethysmograph (PPG) waveforms and toe systolic pressure readings are included as required and additional duplex testing as needed. Limited examinations  for reoccurring indications may be performed as noted.  ABI Findings: +---------+------------------+-----+---------+--------+ Right    Rt Pressure (mmHg)IndexWaveform Comment  +---------+------------------+-----+---------+--------+ Brachial 158                                      +---------+------------------+-----+---------+--------+ ATA      134               0.84 biphasic          +---------+------------------+-----+---------+--------+ PTA      161               1.01 triphasic         +---------+------------------+-----+---------+--------+ [place]  Toe151               0.94 Normal            +---------+------------------+-----+---------+--------+ +---------+------------------+-----+--------+-------+ Left     Lt Pressure (mmHg)IndexWaveformComment +---------+------------------+-----+--------+-------+ Brachial 160                                    +---------+------------------+-----+--------+-------+ ATA      119               0.74 biphasic        +---------+------------------+-----+--------+-------+ PTA      135               0.84 biphasic        +---------+------------------+-----+--------+-------+ Great Toe151               0.94 Normal          +---------+------------------+-----+--------+-------+ +-------+-----------+-----------+------------+------------+ ABI/TBIToday's ABIToday's TBIPrevious ABIPrevious TBI +-------+-----------+-----------+------------+------------+ Right  1.01       .94        .98         .58          +-------+-----------+-----------+------------+------------+ Left   .84        .94        .76         .46          +-------+-----------+-----------+------------+------------+ Bilateral ABIs appear increased compared to prior study on 09/30/2022. Bilateral TBIs appear increased compared to prior study on 09/30/2022.  Summary: Right: Resting right ankle-brachial index is within normal range. The right toe-brachial index is  normal. Left: Resting left ankle-brachial index indicates mild left lower extremity arterial disease. The left toe-brachial index is normal. *See table(s) above for measurements and observations.  Electronically signed by Festus Barren MD on 07/08/2023 at 7:13:25 AM.    Final     Microbiology: Results for orders placed or performed during the hospital encounter of 10/14/21  MRSA Next Gen by PCR, Nasal     Status: None   Collection Time: 10/15/21  9:16 PM   Specimen: Nasal Mucosa; Nasal Swab  Result Value Ref Range Status   MRSA by PCR Next Gen NOT DETECTED NOT DETECTED Final    Comment: (NOTE) The GeneXpert MRSA Assay (FDA approved for NASAL specimens only), is one component of a comprehensive MRSA colonization surveillance program. It is not intended to diagnose MRSA infection nor to guide or monitor treatment for MRSA infections. Test performance is not FDA approved in patients less than 44 years old. Performed at [facility], [address] [place], [place] [address]     Labs: CBC: Recent Labs  Lab 07/15/23 [phone] 07/16/23 0629 07/17/23 0441  WBC 4.4 3.3* 2.5*  NEUTROABS 3.4  --   --   HGB 11.6* 10.6* 10.0*  HCT 33.5* 30.2* 28.4*  MCV 108.1* 107.5* 106.0*  PLT 206 207 233   Basic Metabolic Panel: Recent Labs  Lab 07/15/23 0208 07/16/23 0629  NA 138 138  K 3.3* 3.8  CL 103 105  CO2 23 25  GLUCOSE 239* 113*  BUN 14 11  CREATININE 0.96 1.01*  CALCIUM 9.6 8.4*   Liver Function Tests: Recent Labs  Lab 07/15/23 0208 07/16/23 0629  AST 31 41  ALT 18 27  ALKPHOS 60 51  BILITOT 1.3* 1.3*  PROT 7.0 6.1*  ALBUMIN 3.6 3.0*   CBG: No results for input(s): "GLUCAP" in  the last 168 hours.  Discharge time spent: less than 30 minutes.  Signed: Pennie Banter, DO [facility] 07/17/2023

## 2023-07-17 NOTE — Progress Notes (Signed)
 CC: Cholecystitis, PE Subjective: Patient reports doing well.  She denies any abdominal pain.  Denies any nausea or vomiting.  She is tolerating a heart healthy diet without any problems.  She is having bowel function.  Denies any shortness of breath  Objective: Vital signs in last 24 hours: Temp:  [97.8 F (36.6 C)-98.3 F (36.8 C)] 98.3 F (36.8 C) (04/05 0423) Pulse Rate:  [56-74] 66 (04/05 0423) Resp:  [17-30] 17 (04/05 0423) BP: (131-141)/(43-71) 133/49 (04/05 0423) SpO2:  [94 %-100 %] 95 % (04/05 0423) Last BM Date : 07/14/23  Intake/Output from previous day: 04/04 0701 - 04/05 0700 In: 518.9 [I.V.:370.4; IV Piggyback:148.6] Out: 450 [Urine:450] Intake/Output this shift: No intake/output data recorded.  Physical exam:  Abdomen soft, nontender nondistended.  No evidence of jaundice.  Moving all extremities spontaneously.  Alert and oriented x 3, PERRLA  Lab Results: CBC  Recent Labs    07/16/23 0629 07/17/23 0441  WBC 3.3* 2.5*  HGB 10.6* 10.0*  HCT 30.2* 28.4*  PLT 207 233   BMET Recent Labs    07/15/23 0208 07/16/23 0629  NA 138 138  K 3.3* 3.8  CL 103 105  CO2 23 25  GLUCOSE 239* 113*  BUN 14 11  CREATININE 0.96 1.01*  CALCIUM 9.6 8.4*   PT/INR Recent Labs    07/15/23 1500  LABPROT 15.7*  INR 1.2   ABG No results for input(s): "PHART", "HCO3" in the last 72 hours.  Invalid input(s): "PCO2", "PO2"  Studies/Results: ECHOCARDIOGRAM COMPLETE Result Date: 07/16/2023    ECHOCARDIOGRAM REPORT   Patient Name:   Victoria Steele Date of Exam: 07/16/2023 Medical Rec #:  [id number]      Height:       62.0 in Accession #:    [id number]     Weight:       195.0 lb Date of Birth:  [birth year]      BSA:          1.891 m Patient Age:    83 years       BP:           132/103 mmHg Patient Gender: F              HR:           62 bpm. Exam Location:  [facility] Procedure: 2D Echo, Cardiac Doppler, Color Doppler and Strain Analysis (Both            Spectral and Color Flow  Doppler were utilized during procedure). Indications:     Pulmonary embolus  History:         Patient has prior history of Echocardiogram examinations, most                  recent 10/01/2021. Angina, CAD and Previous Myocardial                  Infarction, PAD, Signs/Symptoms:Dyspnea; Risk                  Factors:Hypertension. Breast CA.  Sonographer:     Mikki Harbor Referring Phys:  [id number] Emeline General Diagnosing Phys: Debbe Odea MD  Sonographer Comments: Global longitudinal strain was attempted. IMPRESSIONS  1. Left ventricular ejection fraction, by estimation, is 60 to 65%. Left ventricular ejection fraction by 2D MOD biplane is 68.4 %. Left ventricular ejection fraction by PLAX is 65 %. The left ventricle has normal function. The left ventricle has no regional wall motion abnormalities. There  is mild left ventricular hypertrophy. Left ventricular diastolic parameters are consistent with Grade I diastolic dysfunction (impaired relaxation).  2. Right ventricular systolic function is normal. The right ventricular size is normal.  3. The mitral valve is normal in structure. No evidence of mitral valve regurgitation.  4. The aortic valve is grossly normal. Aortic valve regurgitation is not visualized.  5. The inferior vena cava is normal in size with greater than 50% respiratory variability, suggesting right atrial pressure of 3 mmHg. FINDINGS  Left Ventricle: Left ventricular ejection fraction, by estimation, is 60 to 65%. Left ventricular ejection fraction by PLAX is 65 %. Left ventricular ejection fraction by 2D MOD biplane is 68.4 %. The left ventricle has normal function. The left ventricle has no regional wall motion abnormalities. Global longitudinal strain performed but not reported based on interpreter judgement due to suboptimal tracking. The left ventricular internal cavity size was normal in size. There is mild left ventricular hypertrophy. Left ventricular diastolic parameters are  consistent with Grade I diastolic dysfunction (impaired relaxation). Right Ventricle: The right ventricular size is normal. No increase in right ventricular wall thickness. Right ventricular systolic function is normal. Left Atrium: Left atrial size was normal in size. Right Atrium: Right atrial size was normal in size. Pericardium: There is no evidence of pericardial effusion. Mitral Valve: The mitral valve is normal in structure. No evidence of mitral valve regurgitation. MV peak gradient, 7.8 mmHg. The mean mitral valve gradient is 3.0 mmHg. Tricuspid Valve: The tricuspid valve is normal in structure. Tricuspid valve regurgitation is trivial. Aortic Valve: The aortic valve is grossly normal. Aortic valve regurgitation is not visualized. Aortic valve mean gradient measures 8.0 mmHg. Aortic valve peak gradient measures 16.0 mmHg. Aortic valve area, by VTI measures 2.50 cm. Pulmonic Valve: The pulmonic valve was normal in structure. Pulmonic valve regurgitation is not visualized. Aorta: The aortic root is normal in size and structure. Venous: The inferior vena cava is normal in size with greater than 50% respiratory variability, suggesting right atrial pressure of 3 mmHg. IAS/Shunts: No atrial level shunt detected by color flow Doppler.  LEFT VENTRICLE PLAX 2D                        Biplane EF (MOD) LV EF:         Left            LV Biplane EF:   Left                ventricular                      ventricular                ejection                         ejection                fraction by                      fraction by                PLAX is 65                       2D MOD                %.  biplane is LVIDd:         4.50 cm                          68.4 %. LVIDs:         2.90 cm LV PW:         1.40 cm         Diastology LV IVS:        1.10 cm         LV e' medial:    8.16 cm/s LVOT diam:     2.00 cm         LV E/e' medial:  9.4 LV SV:         115             LV e' lateral:   8.16  cm/s LV SV Index:   61              LV E/e' lateral: 9.4 LVOT Area:     3.14 cm  LV Volumes (MOD) LV vol d, MOD    48.9 ml A2C: LV vol d, MOD    81.1 ml A4C: LV vol s, MOD    17.3 ml A2C: LV vol s, MOD    22.8 ml A4C: LV SV MOD A2C:   31.6 ml LV SV MOD A4C:   81.1 ml LV SV MOD BP:    43.1 ml RIGHT VENTRICLE RV Basal diam:  3.55 cm RV Mid diam:    2.70 cm RV S prime:     13.10 cm/s TAPSE (M-mode): 3.0 cm LEFT ATRIUM             Index        RIGHT ATRIUM           Index LA diam:        4.20 cm 2.22 cm/m   RA Area:     14.50 cm LA Vol (A2C):   56.7 ml 29.98 ml/m  RA Volume:   32.50 ml  17.18 ml/m LA Vol (A4C):   52.8 ml 27.92 ml/m LA Biplane Vol: 56.0 ml 29.61 ml/m  AORTIC VALVE                     PULMONIC VALVE AV Area (Vmax):    2.43 cm      PV Vmax:       1.17 m/s AV Area (Vmean):   2.53 cm      PV Peak grad:  5.5 mmHg AV Area (VTI):     2.50 cm AV Vmax:           200.00 cm/s AV Vmean:          133.000 cm/s AV VTI:            0.462 m AV Peak Grad:      16.0 mmHg AV Mean Grad:      8.0 mmHg LVOT Vmax:         155.00 cm/s LVOT Vmean:        107.000 cm/s LVOT VTI:          0.367 m LVOT/AV VTI ratio: 0.79  AORTA Ao Root diam: 3.30 cm MITRAL VALVE MV Area (PHT): 2.62 cm     SHUNTS MV Area VTI:   2.60 cm     Systemic VTI:  0.37 m MV Peak grad:  7.8 mmHg     Systemic Diam: 2.00 cm MV  Mean grad:  3.0 mmHg MV Vmax:       1.40 m/s MV Vmean:      80.8 cm/s MV Decel Time: 290 msec MV E velocity: 76.50 cm/s MV A velocity: 119.00 cm/s MV E/A ratio:  0.64 Debbe Odea MD Electronically signed by Debbe Odea MD Signature Date/Time: 07/16/2023/10:37:34 AM    Final     Anti-infectives: Anti-infectives (From admission, onward)    Start     Dose/Rate Route Frequency Ordered Stop   07/15/23 1600  piperacillin-tazobactam (ZOSYN) IVPB 3.375 g       Placed in "Followed by" Linked Group   3.375 g 12.5 mL/hr over 240 Minutes Intravenous Every 8 hours 07/15/23 0826     07/15/23 0900  piperacillin-tazobactam  (ZOSYN) IVPB 3.375 g       Placed in "Followed by" Linked Group   3.375 g 100 mL/hr over 30 Minutes Intravenous  Once 07/15/23 0826 07/15/23 1052   07/15/23 0745  piperacillin-tazobactam (ZOSYN) IVPB 3.375 g  Status:  Discontinued        3.375 g 100 mL/hr over 30 Minutes Intravenous  Once 07/15/23 0731 07/15/23 [id number]       Assessment/Plan:  Patient with acute cholecystitis but also was found to have pulmonary embolism and started on anticoagulation.  This morning she was still on heparin drip but it appears that they will transition her over to p.o. anticoagulation today.  Recommend continued course of antibiotics for a total of 7 days.  Her pain has resolved and she is tolerating a regular diet so okay to discharge from surgical standpoint.  She will need to follow-up with Dr. Tonna Boehringer in 1 week.  Baker Pierini, M.D. [facility]

## 2023-07-27 ENCOUNTER — Other Ambulatory Visit (HOSPITAL_COMMUNITY): Payer: Self-pay

## 2023-07-27 ENCOUNTER — Other Ambulatory Visit: Payer: Self-pay

## 2023-07-27 ENCOUNTER — Telehealth: Payer: Self-pay | Admitting: *Deleted

## 2023-07-27 NOTE — Progress Notes (Signed)
 Specialty Pharmacy Ongoing Clinical Assessment Note  Victoria Steele is a 83 y.o. female who is being followed by the specialty pharmacy service for RxSp Oncology   Patient's specialty medication(s) reviewed today: Palbociclib [name]   Missed doses in the last 4 weeks: 1 (pt was in hospital for blood clot in lung)   Patient/Caregiver did not have any additional questions or concerns.   Therapeutic benefit summary: Patient is achieving benefit   Adverse events/side effects summary: No adverse events/side effects   Patient's therapy is appropriate to: Continue    Goals Addressed             This Visit's Progress    Stabilization of disease   On track    Patient is on track. Patient will maintain adherence.  Dr. Randy Buttery described her disease as stable at last office visit on 01/20/23.          Follow up:  6 months  Malachi Screws Specialty Pharmacist

## 2023-07-27 NOTE — Telephone Encounter (Signed)
 Megan please have her see lauren this week to discuss anticoagulation options

## 2023-07-27 NOTE — Telephone Encounter (Signed)
 Patient called today and trying to get back on Eliquis. She had a acute RLL PE. She needs to have help because she can't afford,

## 2023-07-27 NOTE — Progress Notes (Signed)
 Specialty Pharmacy Refill Coordination Note  Victoria Steele is a 83 y.o. female contacted today regarding refills of specialty medication(s) Palbociclib [name]   Patient requested Delivery   Delivery date: 08/04/23   Verified address: [address],  [place] [address]   Medication will be filled on 08/03/23.

## 2023-07-28 ENCOUNTER — Encounter: Payer: Self-pay | Admitting: Oncology

## 2023-07-28 NOTE — Telephone Encounter (Signed)
 She says that she has met the amount of paying in with [facility] medicare and now she can get the eliquis free. So she does not need to come over tomorrow and does not need [employer].

## 2023-07-29 ENCOUNTER — Inpatient Hospital Stay: Admitting: Nurse Practitioner

## 2023-08-03 ENCOUNTER — Other Ambulatory Visit: Payer: Self-pay

## 2023-08-24 ENCOUNTER — Other Ambulatory Visit: Payer: Medicare Other

## 2023-08-24 ENCOUNTER — Ambulatory Visit: Payer: Medicare Other | Admitting: Oncology

## 2023-08-27 ENCOUNTER — Other Ambulatory Visit: Payer: Self-pay

## 2023-08-27 ENCOUNTER — Other Ambulatory Visit: Payer: Self-pay | Admitting: Pharmacy Technician

## 2023-08-27 NOTE — Progress Notes (Signed)
 Specialty Pharmacy Refill Coordination Note  Victoria Steele is a 83 y.o. female contacted today regarding refills of specialty medication(s) Palbociclib  (IBRANCE )   Patient requested Delivery   Delivery date: 09/01/23   Verified address: [address] [place], [place] [address]   Medication will be filled on 08/31/23.

## 2023-08-31 ENCOUNTER — Other Ambulatory Visit (HOSPITAL_COMMUNITY): Payer: Self-pay

## 2023-08-31 ENCOUNTER — Other Ambulatory Visit: Payer: Self-pay

## 2023-08-31 ENCOUNTER — Encounter (INDEPENDENT_AMBULATORY_CARE_PROVIDER_SITE_OTHER): Payer: Self-pay

## 2023-09-01 ENCOUNTER — Ambulatory Visit
Admission: RE | Admit: 2023-09-01 | Discharge: 2023-09-01 | Disposition: A | Discharge status: Home / Self Care | Source: Ambulatory Visit | Attending: Oncology | Admitting: Oncology

## 2023-09-01 DIAGNOSIS — Z13820 Encounter for screening for osteoporosis: Secondary | ICD-10-CM | POA: Insufficient documentation

## 2023-09-01 DIAGNOSIS — C7951 Secondary malignant neoplasm of bone: Secondary | ICD-10-CM | POA: Insufficient documentation

## 2023-09-01 DIAGNOSIS — Z780 Asymptomatic menopausal state: Secondary | ICD-10-CM | POA: Diagnosis not present

## 2023-09-01 DIAGNOSIS — C801 Malignant (primary) neoplasm, unspecified: Secondary | ICD-10-CM | POA: Insufficient documentation

## 2023-09-04 ENCOUNTER — Other Ambulatory Visit: Payer: Self-pay | Admitting: Oncology

## 2023-09-04 DIAGNOSIS — C7951 Secondary malignant neoplasm of bone: Secondary | ICD-10-CM

## 2023-09-07 ENCOUNTER — Encounter: Payer: Self-pay | Admitting: Oncology

## 2023-09-13 ENCOUNTER — Inpatient Hospital Stay (HOSPITAL_BASED_OUTPATIENT_CLINIC_OR_DEPARTMENT_OTHER): Admitting: Oncology

## 2023-09-13 ENCOUNTER — Encounter: Payer: Self-pay | Admitting: Oncology

## 2023-09-13 ENCOUNTER — Inpatient Hospital Stay: Attending: Oncology

## 2023-09-13 ENCOUNTER — Other Ambulatory Visit: Payer: Self-pay | Admitting: Nurse Practitioner

## 2023-09-13 VITALS — BP 151/55 | HR 81 | Temp 97.3°F | Resp 19 | Ht 62.0 in | Wt 190.1 lb

## 2023-09-13 DIAGNOSIS — Z8052 Family history of malignant neoplasm of bladder: Secondary | ICD-10-CM | POA: Insufficient documentation

## 2023-09-13 DIAGNOSIS — Z95828 Presence of other vascular implants and grafts: Secondary | ICD-10-CM

## 2023-09-13 DIAGNOSIS — C50912 Malignant neoplasm of unspecified site of left female breast: Secondary | ICD-10-CM

## 2023-09-13 DIAGNOSIS — Z79811 Long term (current) use of aromatase inhibitors: Secondary | ICD-10-CM

## 2023-09-13 DIAGNOSIS — I2699 Other pulmonary embolism without acute cor pulmonale: Secondary | ICD-10-CM | POA: Insufficient documentation

## 2023-09-13 DIAGNOSIS — Z7901 Long term (current) use of anticoagulants: Secondary | ICD-10-CM | POA: Diagnosis not present

## 2023-09-13 DIAGNOSIS — C7951 Secondary malignant neoplasm of bone: Secondary | ICD-10-CM | POA: Diagnosis not present

## 2023-09-13 DIAGNOSIS — Z170 Estrogen receptor positive status [ER+]: Secondary | ICD-10-CM | POA: Insufficient documentation

## 2023-09-13 DIAGNOSIS — Z79899 Other long term (current) drug therapy: Secondary | ICD-10-CM | POA: Insufficient documentation

## 2023-09-13 DIAGNOSIS — Z86718 Personal history of other venous thrombosis and embolism: Secondary | ICD-10-CM | POA: Insufficient documentation

## 2023-09-13 DIAGNOSIS — Z801 Family history of malignant neoplasm of trachea, bronchus and lung: Secondary | ICD-10-CM | POA: Diagnosis not present

## 2023-09-13 DIAGNOSIS — Z7983 Long term (current) use of bisphosphonates: Secondary | ICD-10-CM | POA: Diagnosis not present

## 2023-09-13 LAB — CMP (CANCER CENTER ONLY)
ALT: 17 U/L (ref 0–44)
AST: 30 U/L (ref 15–41)
Albumin: 3.4 g/dL — ABNORMAL LOW (ref 3.5–5.0)
Alkaline Phosphatase: 66 U/L (ref 38–126)
Anion gap: 13 (ref 5–15)
BUN: 11 mg/dL (ref 8–23)
CO2: 20 mmol/L — ABNORMAL LOW (ref 22–32)
Calcium: 10 mg/dL (ref 8.9–10.3)
Chloride: 105 mmol/L (ref 98–111)
Creatinine: 0.95 mg/dL (ref 0.44–1.00)
GFR, Estimated: >60 mL/min (ref >60)
Glucose, Bld: 198 mg/dL — ABNORMAL HIGH (ref 70–99)
Potassium: 3.6 mmol/L (ref 3.5–5.1)
Sodium: 138 mmol/L (ref 135–145)
Total Bilirubin: 1.2 mg/dL (ref 0.0–1.2)
Total Protein: 7.4 g/dL (ref 6.5–8.1)

## 2023-09-13 LAB — CBC WITH DIFFERENTIAL (CANCER CENTER ONLY)
Abs Immature Granulocytes: 0.01 10*3/uL (ref 0.00–0.07)
Basophils Absolute: 0.1 10*3/uL (ref 0.0–0.1)
Basophils Relative: 2 %
Eosinophils Absolute: 0.1 10*3/uL (ref 0.0–0.5)
Eosinophils Relative: 3 %
HCT: 34.3 % — ABNORMAL LOW (ref 36.0–46.0)
Hemoglobin: 11.8 g/dL — ABNORMAL LOW (ref 12.0–15.0)
Immature Granulocytes: 0 %
Lymphocytes Relative: 38 %
Lymphs Abs: 1.2 10*3/uL (ref 0.7–4.0)
MCH: 37.2 pg — ABNORMAL HIGH (ref 26.0–34.0)
MCHC: 34.4 g/dL (ref 30.0–36.0)
MCV: 108.2 fL — ABNORMAL HIGH (ref 80.0–100.0)
Monocytes Absolute: 0.2 10*3/uL (ref 0.1–1.0)
Monocytes Relative: 5 %
Neutro Abs: 1.7 10*3/uL (ref 1.7–7.7)
Neutrophils Relative %: 52 %
Platelet Count: 287 10*3/uL (ref 150–400)
RBC: 3.17 MIL/uL — ABNORMAL LOW (ref 3.87–5.11)
RDW: 15 % (ref 11.5–15.5)
Smear Review: NORMAL
WBC Count: 3.3 10*3/uL — ABNORMAL LOW (ref 4.0–10.5)
nRBC: 0 % (ref 0.0–0.2)

## 2023-09-13 MED ORDER — HEPARIN SOD (PORK) LOCK FLUSH 100 UNIT/ML IV SOLN
500.0000 [IU] | Freq: Once | INTRAVENOUS | Status: AC
  Administered 2023-09-13: 500 [IU] via INTRAVENOUS
  Filled 2023-09-13: qty 5

## 2023-09-13 MED ORDER — APIXABAN 5 MG PO TABS: 5.0000 mg | ORAL_TABLET | Freq: Two times a day (BID) | ORAL | 2 refills | Status: DC

## 2023-09-13 MED ORDER — SODIUM CHLORIDE 0.9% FLUSH
10.0000 mL | Freq: Once | INTRAVENOUS | Status: AC
  Administered 2023-09-13: 10 mL via INTRAVENOUS
  Filled 2023-09-13: qty 10

## 2023-09-13 NOTE — Progress Notes (Signed)
 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  Patient has been on Ibrance  and letrozole  since then.  She had cytopenias with Ibrance  and is taking 75 mg 3 weeks on 1 week off which was started in September 2018.  Patient is on Zometa  for bone metastases which she is getting every 3 months. Patient had extensive RLE DVT in April 2019 and is currently on eliquis . She also underwent venous thrombectomy and thrombolytic therapy and IVC placement by Dr. Vonna Guardian.  She also has peripheral vascular disease secondary to atherosclerosis and underwent stenting with vascular surgery as well.  Patient was on Zometa  for bone metastases for 5 years and it was later on discontinued  Pain was on Eliquis  for history of prior DVT for 6 years and it was stopped in January 2025 as it was getting expensive for her as well.  Patient was noted to have new Right lower extremity DVT and segmental pulmonary embolism in April 2025 and therefore Eliquis  has been restarted    Interval history-patient was able to get her Eliquis  with $0 co-pay presently.  She is tolerating Eliquis  well without any significant bleeding issues.  She is also on letrozole  plus Ibrance  for her breast cancer which she is tolerating it well.  ECOG PS- 1 Pain scale-  0 Opioid associated constipation- no  Review of systems- Review of Systems  Constitutional:  Negative for chills, fever, malaise/fatigue and weight loss.  HENT:  Negative for congestion, ear discharge and nosebleeds.   Eyes:  Negative for blurred vision.  Respiratory:  Negative for cough, hemoptysis, sputum production, shortness of breath and wheezing.   Cardiovascular:  Negative for chest pain, palpitations, orthopnea and claudication.  Gastrointestinal:  Negative for abdominal pain, blood in stool, constipation, diarrhea, heartburn, melena, nausea and vomiting.  Genitourinary:  Negative for dysuria, flank pain, frequency, hematuria and urgency.  Musculoskeletal:  Negative for back pain, joint pain and myalgias.  Skin:  Negative for rash.  Neurological:  Negative for dizziness, tingling, focal weakness, seizures, weakness and headaches.  Endo/Heme/Allergies:  Does not bruise/bleed easily.  Psychiatric/Behavioral:  Negative for depression and suicidal ideas. The patient does not have insomnia.       Allergies  Allergen Reactions   Codeine Rash    hallucinations     Past Medical History:  Diagnosis Date   Coronary artery disease involving native coronary artery 08/30/2021   a. 08/2021 Cor CTA: Cor Ca2+ = 788.  Sev mRCA dzs, Mod LAD and LCx dzs; b. 10/2021 PCI: LM nl, LAD 75/pm (3.0x15 Onyx Frontier DES), 59m, LCX 75p (2.5x18 Onyx Frontier DES), RCA 100p/m w/ R->R and L->R collats to distal vessel/RPDA.   Diastolic dysfunction    a. 09/2021 Echo: EF 60-65%, no rwma, GrI DD, nl RV fxn.   DVT (deep venous thrombosis) (HCC)    2019   Hypertension  Left Breast cancer [facility])    Port catheter in place 09/14/2016   Placed 09/10/2016 RT chest.     Past Surgical History:  Procedure Laterality Date   ABDOMINAL HYSTERECTOMY     APPENDECTOMY     BREAST BIOPSY     CORONARY STENT INTERVENTION N/A 10/16/2021   Procedure: CORONARY STENT INTERVENTION;  Surgeon: Arleen Lacer, MD;   Location: [facility] INVASIVE CV LAB: Prox LAD 75%->0% Onyx Frontier DES 3 x 15 (3.3); Mid LCx 75% (prox to OM2) -> Onyx Frontier DES 2.5 x 18 -> tapered 2.8 to 2.6 mm   CT CTA CORONARY W/CA SCORE W/CM &/OR WO/CM  09/01/2021   Coronary Calcium  Score 788.  Combination of calcified and noncalcified plaque-severe lesion in mid RCA (estimated greater than 70%).  Moderate LAD and LCx 50 to 69%). => CAD RADS 4 with severe stenosis suggested in the RCA.  FFRCT not submitted due to motion artifact.-Recommended cardiac catheterization.   CYST EXCISION Right 09/2016   Buttocks   IVC FILTER REMOVAL N/A 01/17/2018   Procedure: IVC FILTER REMOVAL;  Surgeon: Celso College, MD;  Location: [facility] INVASIVE CV LAB;  Service: Cardiovascular;  Laterality: N/A;   LEFT HEART CATH AND CORONARY ANGIOGRAPHY N/A 10/16/2021   Procedure: LEFT HEART CATH AND CORONARY ANGIOGRAPHY;  Surgeon: Arleen Lacer, MD;  Location: [facility] INVASIVE CV LAB:: mRCA 100% CTO (L-R  & bridging collaterals); prox LAD 75%( DES PCI) - dLAD-rDPDA colleterals; mLCx 75% just proximal to the OM 2 (DES PCI).  Normal EDP.   LOWER EXTREMITY ANGIOGRAPHY Right 11/11/2017   Procedure: LOWER EXTREMITY ANGIOGRAPHY;  Surgeon: Jackquelyn Mass, MD;  Location: [facility] INVASIVE CV LAB;  Service: Cardiovascular;  Laterality: Right;   PERIPHERAL VASCULAR THROMBECTOMY Right 08/30/2017   Procedure: PERIPHERAL VASCULAR THROMBECTOMY;  Surgeon: Celso College, MD;  Location: [facility] INVASIVE CV LAB;  Service: Cardiovascular;  Laterality: Right;   PORTACATH PLACEMENT Right 09/10/2016   Procedure: INSERTION PORT-A-CATH;  Surgeon: Alben Alma, MD;  Location: [facility] ORS;  Service: General;  Laterality: Right;    Social History   Socioeconomic History   Marital status: Married    Spouse name: Royston Cornea   Number of children: 2   Years of education: 6   Highest education level: Bachelor's degree (e.g., BA, AB, BS)  Occupational History   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[employer]  Tobacco Use   Smoking status: Never   Smokeless tobacco: Never  Vaping Use   Vaping status: Never Used  Substance and Sexual Activity   Alcohol use: No   Drug use: No   Sexual activity: Never  Other Topics Concern   Not on file  Social History Narrative   Not on file   Social Drivers of Health   Financial Resource Strain: Low Risk  (04/05/2017)   Overall Financial Resource Strain (CARDIA)    Difficulty of Paying Living Expenses: 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  Food Insecurity: No Food Insecurity (07/15/2023)   Hunger Vital Sign    Worried About Running Out of Food in the Last Year: Never true    Ran Out of Food in the Last Year: Never true  Transportation Needs: No Transportation Needs (07/15/2023)   PRAPARE - Administrator, Civil Service (Medical): No    Lack of Transportation (Non-Medical): No  Physical Activity: Unknown (04/05/2017)   Exercise Vital Sign    Days of Exercise per Week: Patient declined    Minutes of Exercise  per Session: Patient declined  Stress: No Stress Concern Present (04/05/2017)   [employer] of Occupational Health - Occupational Stress Questionnaire    Feeling of Stress : Not at all  Social Connections: Moderately Integrated (07/15/2023)   Social Connection and Isolation Panel [NHANES]    Frequency of Communication with Friends and Family: More than three times a week    Frequency of Social Gatherings with Friends and Family: More than three times a week    Attends Religious Services: More than 4 times per year    Active Member of [employer] or Organizations: No    Attends Banker Meetings: Never    Marital Status: Married  Catering manager Violence: Not At Risk (07/15/2023)   Humiliation, Afraid, Rape, and Kick questionnaire    Fear of Current or Ex-Partner: No    Emotionally Abused: No    Physically Abused: No    Sexually Abused: No    Family History  Problem Relation Age of Onset   Parkinson's disease Mother     Heart disease Father    Bladder Cancer Father    Lung cancer Father    Heart disease Maternal Grandfather      Current Outpatient Medications:    acetaminophen  (TYLENOL ) 500 MG tablet, Take 500 mg by mouth daily as needed for moderate pain or headache. , Disp: , Rfl:    [Paused] amLODipine  (NORVASC ) 10 MG tablet, Take 10 mg by mouth daily., Disp: , Rfl:    apixaban  (ELIQUIS ) 5 MG TABS tablet, Take 1 tablet (5 mg total) by mouth 2 (two) times daily., Disp: 90 tablet, Rfl: 2   APIXABAN  (ELIQUIS ) VTE STARTER PACK (10MG  AND 5MG ), Take as directed on package: start with two-5mg  tablets twice daily for 7 days. On day 8, switch to one-5mg  tablet twice daily., Disp: 74 each, Rfl: 0   atorvastatin  (LIPITOR) 40 MG tablet, Take 40 mg by mouth at bedtime., Disp: , Rfl:    Calcium  Carb-Cholecalciferol (CALCIUM  600/VITAMIN D3 PO), Take 1 tablet by mouth daily., Disp: , Rfl:    chlorhexidine  (PERIDEX ) 0.12 % solution, Use as directed 15 mLs in the mouth or throat 2 (two) times daily. After breakfast and before bedtime, Disp: , Rfl:    isosorbide  mononitrate (IMDUR ) 30 MG 24 hr tablet, Take 1 tablet (30 mg total) by mouth daily., Disp: 90 tablet, Rfl: 3   letrozole  (FEMARA ) 2.5 MG tablet, Take 1 tablet by mouth once daily, Disp: 90 tablet, Rfl: 0   lidocaine -prilocaine  (EMLA ) cream, Apply 1 application. topically as needed (port access)., Disp: 30 g, Rfl: 2   [Paused] losartan  (COZAAR ) 50 MG tablet, Take 1 tablet by mouth once a day  for high blood pressure- to replace 100 mg, Disp: , Rfl:    metoprolol  succinate (TOPROL -XL) 25 MG 24 hr tablet, Take 1 tablet by mouth once daily, Disp: 90 tablet, Rfl: 3   neomycin -bacitracin -polymyxin (NEOSPORIN) OINT, Apply 1 Application topically daily., Disp: , Rfl:    nitroGLYCERIN  (NITROSTAT ) 0.4 MG SL tablet, Place 1 tablet (0.4 mg total) under the tongue every 5 (five) minutes x 3 doses as needed for chest pain., Disp: 30 tablet, Rfl: 0   ondansetron  (ZOFRAN ) 4 MG  tablet, TAKE 1 TABLET BY MOUTH EVERY 8 HOURS AS NEEDED FOR NAUSEA OR VOMITING, Disp: 30 tablet, Rfl: 0   palbociclib  (IBRANCE ) 75 MG tablet, Take 1 tablet (75 mg total) by mouth daily. Take for 21 days on, 7 days off, repeat every 28 days., Disp: 21 tablet,  Rfl: 3  Physical exam:  Vitals:   09/13/23 1048  BP: (!) 151/55  Pulse: 81  Resp: 19  Temp: (!) 97.3 F (36.3 C)  TempSrc: Tympanic  SpO2: 100%  Weight: 190 lb 1.6 oz (86.2 kg)  Height: 5\' 2"  (1.575 m)   Physical Exam Cardiovascular:     Rate and Rhythm: Normal rate and regular rhythm.     Heart sounds: Normal heart sounds.  Pulmonary:     Effort: Pulmonary effort is normal.     Breath sounds: Normal breath sounds.  Skin:    General: Skin is warm and dry.  Neurological:     Mental Status: She is alert and oriented to person, place, and time.      I have personally reviewed labs listed below:    Latest Ref Rng & Units 09/13/2023   10:06 AM  CMP  Glucose 70 - 99 mg/dL 478   BUN 8 - 23 mg/dL 11   Creatinine 2.95 - 1.00 mg/dL 6.21   Sodium 308 - 657 mmol/L 138   Potassium 3.5 - 5.1 mmol/L 3.6   Chloride 98 - 111 mmol/L 105   CO2 22 - 32 mmol/L 20   Calcium  8.9 - 10.3 mg/dL 84.6   Total Protein 6.5 - 8.1 g/dL 7.4   Total Bilirubin 0.0 - 1.2 mg/dL 1.2   Alkaline Phos 38 - 126 U/L 66   AST 15 - 41 U/L 30   ALT 0 - 44 U/L 17       Latest Ref Rng & Units 09/13/2023   10:06 AM  CBC  WBC 4.0 - 10.5 K/uL 3.3   Hemoglobin 12.0 - 15.0 g/dL 96.2   Hematocrit 95.2 - 46.0 % 34.3   Platelets 150 - 400 K/uL 287    I have personally reviewed Radiology images listed below: No images are attached to the encounter.  DG Bone Density Result Date: 09/01/2023 EXAM: DUAL X-RAY ABSORPTIOMETRY (DXA) FOR BONE MINERAL DENSITY 09/01/2023 10:13 am CLINICAL DATA:  83 year old Female Postmenopausal. Screening for osteoporosis TECHNIQUE: An axial (e.g., hips, spine) and/or appendicular (e.g., radius) exam was performed, as appropriate, using  GE Secretary/administrator at [facility]. Images are obtained for bone mineral density measurement and are not obtained for diagnostic purposes. [id number] Exclusions: Lumbar spine due to advanced degenerative changes COMPARISON:  12/18/2020 FINDINGS: Scan quality: Good. LEFT FEMORAL NECK: BMD (in g/cm2): 1.099 T-score: 0.4 Z-score: 2.6 Rate of change from previous exam: No significant rate of change from previous exam. LEFT TOTAL HIP: BMD (in g/cm2): 1.365 T-score: 2.8 Z-score: 4.9 RIGHT FEMORAL NECK: BMD (in g/cm2): 1.228 T-score: 1.4 Z-score: 3.5 RIGHT TOTAL HIP: BMD (in g/cm2): 1.326 T-score: 2.5 Z-score: 4.6 DUAL-FEMUR TOTAL MEAN: Rate of change from previous exam: No significant rate of change from previous exam.% LEFT FOREARM (RADIUS 33%): BMD (in g/cm2): 0.833 T-score: -0.5 Z-score: 2.3 Rate of change from previous exam: No significant rate of change from previous exam. FRAX 10-YEAR PROBABILITY OF FRACTURE: FRAX not reported as the lowest BMD is not in the osteopenia range. IMPRESSION: Normal based on BMD. Fracture risk is not increased. RECOMMENDATIONS: 1. All patients should optimize calcium  and vitamin D intake. 2. Consider FDA-approved medical therapies in postmenopausal women and men aged 80 years and older, based on the following: - A hip or vertebral (clinical or morphometric) fracture - T-score less than or equal to -2.5 and secondary causes have been excluded. - Low bone mass (T-score between -1.0 and -  2.5) and a 10-year probability of a hip fracture greater than or equal to 3% or a 10-year probability of a major osteoporosis-related fracture greater than or equal to 20% based on the [place] -adapted WHO algorithm. - Clinician judgment and/or patient preferences may indicate treatment for people with 10-year fracture probabilities above or below these levels 3. Patients with diagnosis of osteoporosis or at high risk for fracture should have regular bone mineral density tests. For  patients eligible for Medicare, routine testing is allowed once every 2 years. The testing frequency can be increased to one year for patients who have rapidly progressing disease, those who are receiving or discontinuing medical therapy to restore bone mass, or have additional risk factors. Electronically Signed   By: Dina  Arceo M.D.   On: 09/01/2023 11:33     Assessment and plan- Patient is a 83 y.o. female with history of metastatic ER/PR positive HER2 negative breast cancer with bony metastases currently on letrozole  plus Ibrance  here for routine follow-up  Patient will continue letrozole  along with Ibrance  until progression of toxicity.  White cell count is mildly low at 3.3 but ANC remains more than 1.  I will see her back in 3 months with labs and I will plan to get a CT chest abdomen pelvis with contrast prior..  Discussed results of bone density scan which were normal as well.  Patient was previously on anticoagulation from 2019 apparently January 2025.  It was stopped recently given cost issues as well.  However patient had a recurrent right lower extremity DVT in segment for PE in April 2025.  She will therefore now remain on Eliquis  indefinitely.  Her present co-pay $0.  I did renew her Eliquis  prescription for today   Visit Diagnosis 1. High risk medication use   2. Malignant neoplasm metastatic to bone (HCC)   3. Use of letrozole  (Femara )   4. Chronic anticoagulation      Dr. Seretha Dance, MD, MPH [facility] [phone] 09/13/2023 12:49 PM

## 2023-09-14 LAB — CANCER ANTIGEN 27.29: CA 27.29: 85.6 U/mL — ABNORMAL HIGH (ref 0.0–38.6)

## 2023-09-16 ENCOUNTER — Encounter: Payer: Self-pay | Admitting: Oncology

## 2023-09-21 ENCOUNTER — Other Ambulatory Visit: Payer: Self-pay

## 2023-09-21 ENCOUNTER — Other Ambulatory Visit: Payer: Self-pay | Admitting: Oncology

## 2023-09-21 ENCOUNTER — Other Ambulatory Visit (HOSPITAL_COMMUNITY): Payer: Self-pay

## 2023-09-21 DIAGNOSIS — C50912 Malignant neoplasm of unspecified site of left female breast: Secondary | ICD-10-CM

## 2023-09-21 MED ORDER — PALBOCICLIB 75 MG PO TABS
75.0000 mg | ORAL_TABLET | Freq: Every day | ORAL | 3 refills | Status: DC
  Filled 2023-09-21: qty 21, 28d supply, fill #0
  Filled 2023-10-13: qty 21, 28d supply, fill #1
  Filled 2023-11-02 – 2023-11-17 (×3): qty 21, 28d supply, fill #2
  Filled 2023-12-09: qty 21, 28d supply, fill #3

## 2023-09-21 NOTE — Progress Notes (Addendum)
 Specialty Pharmacy Refill Coordination Note  Victoria Steele is a 83 y.o. female contacted today regarding refills of specialty medication(s) Palbociclib  (IBRANCE )   Patient requested Delivery   Delivery date: 09/27/23   Verified address: [address], [place], [place] [address]   Medication will be filled on 09/24/23. This fill date is pending response to refill request from provider. Patient is aware and if they have not received fill by intended date they must follow up with pharmacy.

## 2023-09-22 ENCOUNTER — Other Ambulatory Visit: Payer: Self-pay

## 2023-09-23 ENCOUNTER — Other Ambulatory Visit: Payer: Self-pay

## 2023-10-13 ENCOUNTER — Other Ambulatory Visit: Payer: Self-pay

## 2023-10-13 NOTE — Progress Notes (Signed)
 Specialty Pharmacy Refill Coordination Note  Victoria Steele is a 83 y.o. female contacted today regarding refills of specialty medication(s) Palbociclib  (IBRANCE )   Patient requested Delivery   Delivery date: 10/19/23   Verified address: [address], [place], [place] [address]   Medication will be filled on 10/18/23.

## 2023-11-02 ENCOUNTER — Other Ambulatory Visit: Payer: Self-pay

## 2023-11-03 ENCOUNTER — Other Ambulatory Visit: Payer: Self-pay

## 2023-11-03 ENCOUNTER — Other Ambulatory Visit (HOSPITAL_COMMUNITY): Payer: Self-pay

## 2023-11-17 ENCOUNTER — Other Ambulatory Visit: Payer: Self-pay

## 2023-11-17 NOTE — Progress Notes (Signed)
 Specialty Pharmacy Refill Coordination Note  Victoria Steele is a 83 y.o. female contacted today regarding refills of specialty medication(s) Palbociclib  (IBRANCE )   Patient requested Delivery   Delivery date: 11/19/23   Verified address: [address], [place], [place] [address]   Medication will be filled on 11/18/23.

## 2023-12-02 ENCOUNTER — Ambulatory Visit
Admission: RE | Admit: 2023-12-02 | Discharge: 2023-12-02 | Disposition: A | Discharge status: Home / Self Care | Source: Ambulatory Visit | Attending: Oncology | Admitting: Oncology

## 2023-12-02 DIAGNOSIS — C7951 Secondary malignant neoplasm of bone: Secondary | ICD-10-CM | POA: Insufficient documentation

## 2023-12-02 MED ORDER — TECHNETIUM TC 99M MEDRONATE IV KIT
20.0000 | PACK | Freq: Once | INTRAVENOUS | Status: AC | PRN
  Administered 2023-12-02: 21.91 via INTRAVENOUS

## 2023-12-03 ENCOUNTER — Other Ambulatory Visit: Payer: Self-pay | Admitting: Oncology

## 2023-12-03 DIAGNOSIS — C7951 Secondary malignant neoplasm of bone: Secondary | ICD-10-CM

## 2023-12-07 ENCOUNTER — Encounter: Payer: Self-pay | Admitting: Oncology

## 2023-12-09 ENCOUNTER — Encounter

## 2023-12-09 ENCOUNTER — Other Ambulatory Visit: Payer: Self-pay

## 2023-12-09 ENCOUNTER — Ambulatory Visit
Admission: RE | Admit: 2023-12-09 | Discharge: 2023-12-09 | Disposition: A | Discharge status: Home / Self Care | Source: Ambulatory Visit | Attending: Oncology | Admitting: Oncology

## 2023-12-09 ENCOUNTER — Other Ambulatory Visit: Payer: Self-pay | Admitting: Pharmacy Technician

## 2023-12-09 DIAGNOSIS — C7951 Secondary malignant neoplasm of bone: Secondary | ICD-10-CM | POA: Diagnosis present

## 2023-12-09 LAB — POCT I-STAT CREATININE: Creatinine, Ser: 1.1 mg/dL — ABNORMAL HIGH (ref 0.44–1.00)

## 2023-12-09 MED ORDER — IOHEXOL 300 MG/ML  SOLN
100.0000 mL | Freq: Once | INTRAMUSCULAR | Status: AC | PRN
  Administered 2023-12-09: 100 mL via INTRAVENOUS

## 2023-12-09 NOTE — Progress Notes (Signed)
 Specialty Pharmacy Refill Coordination Note  Victoria Steele is a 83 y.o. female contacted today regarding refills of specialty medication(s) Palbociclib  (IBRANCE )   Patient requested Delivery   Delivery date: 12/21/23   Verified address: [address]  [place] [address]   Medication will be filled on 12/20/23.  Break on 9.7 - 9/14 & will resume on 9/15

## 2023-12-17 ENCOUNTER — Encounter: Payer: Self-pay | Admitting: *Deleted

## 2023-12-20 ENCOUNTER — Other Ambulatory Visit: Payer: Self-pay

## 2023-12-21 ENCOUNTER — Encounter: Payer: Self-pay | Admitting: Oncology

## 2023-12-21 ENCOUNTER — Inpatient Hospital Stay: Admitting: Oncology

## 2023-12-21 ENCOUNTER — Inpatient Hospital Stay: Attending: Oncology

## 2023-12-21 VITALS — BP 135/71 | HR 63 | Temp 96.9°F | Resp 18 | Ht 62.0 in | Wt 193.0 lb

## 2023-12-21 DIAGNOSIS — Z7901 Long term (current) use of anticoagulants: Secondary | ICD-10-CM | POA: Insufficient documentation

## 2023-12-21 DIAGNOSIS — Z170 Estrogen receptor positive status [ER+]: Secondary | ICD-10-CM | POA: Diagnosis not present

## 2023-12-21 DIAGNOSIS — C7951 Secondary malignant neoplasm of bone: Secondary | ICD-10-CM

## 2023-12-21 DIAGNOSIS — D702 Other drug-induced agranulocytosis: Secondary | ICD-10-CM | POA: Insufficient documentation

## 2023-12-21 DIAGNOSIS — Z79899 Other long term (current) drug therapy: Secondary | ICD-10-CM

## 2023-12-21 DIAGNOSIS — Z86711 Personal history of pulmonary embolism: Secondary | ICD-10-CM | POA: Insufficient documentation

## 2023-12-21 DIAGNOSIS — Z79811 Long term (current) use of aromatase inhibitors: Secondary | ICD-10-CM | POA: Insufficient documentation

## 2023-12-21 DIAGNOSIS — Z86718 Personal history of other venous thrombosis and embolism: Secondary | ICD-10-CM | POA: Diagnosis not present

## 2023-12-21 DIAGNOSIS — C50412 Malignant neoplasm of upper-outer quadrant of left female breast: Secondary | ICD-10-CM | POA: Insufficient documentation

## 2023-12-21 LAB — CBC WITH DIFFERENTIAL (CANCER CENTER ONLY)
Abs Immature Granulocytes: 0.01 K/uL (ref 0.00–0.07)
Basophils Absolute: 0.1 K/uL (ref 0.0–0.1)
Basophils Relative: 3 %
Eosinophils Absolute: 0 K/uL (ref 0.0–0.5)
Eosinophils Relative: 2 %
HCT: 32.9 % — ABNORMAL LOW (ref 36.0–46.0)
Hemoglobin: 11.8 g/dL — ABNORMAL LOW (ref 12.0–15.0)
Immature Granulocytes: 0 %
Lymphocytes Relative: 35 %
Lymphs Abs: 0.9 K/uL (ref 0.7–4.0)
MCH: 38.7 pg — ABNORMAL HIGH (ref 26.0–34.0)
MCHC: 35.9 g/dL (ref 30.0–36.0)
MCV: 107.9 fL — ABNORMAL HIGH (ref 80.0–100.0)
Monocytes Absolute: 0.1 K/uL (ref 0.1–1.0)
Monocytes Relative: 5 %
Neutro Abs: 1.4 K/uL — ABNORMAL LOW (ref 1.7–7.7)
Neutrophils Relative %: 55 %
Platelet Count: 124 K/uL — ABNORMAL LOW (ref 150–400)
RBC: 3.05 MIL/uL — ABNORMAL LOW (ref 3.87–5.11)
RDW: 15.1 % (ref 11.5–15.5)
WBC Count: 2.5 K/uL — ABNORMAL LOW (ref 4.0–10.5)
nRBC: 0 % (ref 0.0–0.2)

## 2023-12-21 LAB — CMP (CANCER CENTER ONLY)
ALT: 19 U/L (ref 0–44)
AST: 35 U/L (ref 15–41)
Albumin: 3.6 g/dL (ref 3.5–5.0)
Alkaline Phosphatase: 58 U/L (ref 38–126)
Anion gap: 11 (ref 5–15)
BUN: 14 mg/dL (ref 8–23)
CO2: 20 mmol/L — ABNORMAL LOW (ref 22–32)
Calcium: 9 mg/dL (ref 8.9–10.3)
Chloride: 103 mmol/L (ref 98–111)
Creatinine: 1.01 mg/dL — ABNORMAL HIGH (ref 0.44–1.00)
GFR, Estimated: 56 mL/min — ABNORMAL LOW (ref >60)
Glucose, Bld: 174 mg/dL — ABNORMAL HIGH (ref 70–99)
Potassium: 3.9 mmol/L (ref 3.5–5.1)
Sodium: 134 mmol/L — ABNORMAL LOW (ref 135–145)
Total Bilirubin: 1.4 mg/dL — ABNORMAL HIGH (ref 0.0–1.2)
Total Protein: 7.1 g/dL (ref 6.5–8.1)

## 2023-12-21 NOTE — Progress Notes (Signed)
 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  Patient has been on Ibrance  and letrozole  since then.  She had cytopenias with Ibrance  and is taking 75 mg 3 weeks on 1 week off which was started in September 2018.  Patient is on Zometa  for bone metastases which she is getting every 3 months. Patient had extensive RLE DVT in April 2019 and is currently on eliquis . She also underwent venous thrombectomy and thrombolytic therapy and IVC placement by Dr. Marea.  She also has peripheral vascular disease secondary to atherosclerosis and underwent stenting with vascular surgery as well.  Patient was on Zometa  for bone metastases for 5 years and it was later on discontinued   Pain was on Eliquis  for history of prior DVT for 6 years and it was stopped in January 2025 as it was getting expensive for her as well.  Patient was noted to have new Right lower extremity DVT and segmental pulmonary embolism in April 2025 and therefore Eliquis  has been restarted    Interval history-she is tolerating letrozole  plus Ibrance  well.  Occasional left knee pain which is self-limited.  ECOG PS- 1 Pain scale- 0   Review of systems- Review of Systems  Constitutional:  Negative for chills, fever, malaise/fatigue and weight loss.   HENT:  Negative for congestion, ear discharge and nosebleeds.   Eyes:  Negative for blurred vision.  Respiratory:  Negative for cough, hemoptysis, sputum production, shortness of breath and wheezing.   Cardiovascular:  Negative for chest pain, palpitations, orthopnea and claudication.  Gastrointestinal:  Negative for abdominal pain, blood in stool, constipation, diarrhea, heartburn, melena, nausea and vomiting.  Genitourinary:  Negative for dysuria, flank pain, frequency, hematuria and urgency.  Musculoskeletal:  Negative for back pain, joint pain and myalgias.  Skin:  Negative for rash.  Neurological:  Negative for dizziness, tingling, focal weakness, seizures, weakness and headaches.  Endo/Heme/Allergies:  Does not bruise/bleed easily.  Psychiatric/Behavioral:  Negative for depression and suicidal ideas. The patient does not have insomnia.       Allergies  Allergen Reactions   Codeine Rash    hallucinations     Past Medical History:  Diagnosis Date   Coronary artery disease involving native coronary artery 08/30/2021   a. 08/2021 Cor CTA: Cor Ca2+ = 788.  Sev mRCA dzs, Mod LAD and LCx dzs; b. 10/2021 PCI: LM nl, LAD 75/pm (3.0x15 Onyx Frontier DES), 75m, LCX 75p (2.5x18 Onyx Frontier DES), RCA 100p/m w/ R->R and L->R collats to distal vessel/RPDA.   Diastolic dysfunction    a. 09/2021 Echo: EF 60-65%, no rwma, GrI DD, nl RV fxn.   DVT (deep venous thrombosis) (HCC)    2019   Hypertension    Left Breast cancer (HCC)    Port catheter in place 09/14/2016   Placed 09/10/2016 RT chest.     Past Surgical  History:  Procedure Laterality Date   ABDOMINAL HYSTERECTOMY     APPENDECTOMY     BREAST BIOPSY     CORONARY STENT INTERVENTION N/A 10/16/2021   Procedure: CORONARY STENT INTERVENTION;  Surgeon: Anner Alm ORN, MD;  Location: [facility] INVASIVE CV LAB: Prox LAD 75%->0% Onyx Frontier DES 3 x 15 (3.3); Mid LCx 75% (prox to OM2) -> Onyx Frontier DES 2.5 x 18 -> tapered 2.8 to 2.6 mm    CT CTA CORONARY W/CA SCORE W/CM &/OR WO/CM  09/01/2021   Coronary Calcium  Score 788.  Combination of calcified and noncalcified plaque-severe lesion in mid RCA (estimated greater than 70%).  Moderate LAD and LCx 50 to 69%). => CAD RADS 4 with severe stenosis suggested in the RCA.  FFRCT not submitted due to motion artifact.-Recommended cardiac catheterization.   CYST EXCISION Right 09/2016   Buttocks   IVC FILTER REMOVAL N/A 01/17/2018   Procedure: IVC FILTER REMOVAL;  Surgeon: Marea Selinda RAMAN, MD;  Location: [facility] INVASIVE CV LAB;  Service: Cardiovascular;  Laterality: N/A;   LEFT HEART CATH AND CORONARY ANGIOGRAPHY N/A 10/16/2021   Procedure: LEFT HEART CATH AND CORONARY ANGIOGRAPHY;  Surgeon: Anner Alm ORN, MD;  Location: [facility] INVASIVE CV LAB:: mRCA 100% CTO (L-R  & bridging collaterals); prox LAD 75%( DES PCI) - dLAD-rDPDA colleterals; mLCx 75% just proximal to the OM 2 (DES PCI).  Normal EDP.   LOWER EXTREMITY ANGIOGRAPHY Right 11/11/2017   Procedure: LOWER EXTREMITY ANGIOGRAPHY;  Surgeon: Jama Cordella MATSU, MD;  Location: [facility] INVASIVE CV LAB;  Service: Cardiovascular;  Laterality: Right;   PERIPHERAL VASCULAR THROMBECTOMY Right 08/30/2017   Procedure: PERIPHERAL VASCULAR THROMBECTOMY;  Surgeon: Marea Selinda RAMAN, MD;  Location: [facility] INVASIVE CV LAB;  Service: Cardiovascular;  Laterality: Right;   PORTACATH PLACEMENT Right 09/10/2016   Procedure: INSERTION PORT-A-CATH;  Surgeon: Jordis Laneta FALCON, MD;  Location: [facility] ORS;  Service: General;  Laterality: Right;    Social History   Socioeconomic History   Marital status: Married    Spouse name: Lynwood   Number of children: 2   Years of education: 6   Highest education level: Bachelor's degree (e.g., BA, AB, BS)  Occupational History   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[employer]  Tobacco Use   Smoking status: Never   Smokeless tobacco: Never  Vaping Use   Vaping status: Never Used  Substance and Sexual Activity    Alcohol use: No   Drug use: No   Sexual activity: Never  Other Topics Concern   Not on file  Social History Narrative   Not on file   Social Drivers of Health   Financial Resource Strain: Low Risk  (04/05/2017)   Overall Financial Resource Strain (CARDIA)    Difficulty of Paying Living Expenses: 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  Food Insecurity: No Food Insecurity (07/15/2023)   Hunger Vital Sign    Worried About Running Out of Food in the Last Year: Never true    Ran Out of Food in the Last Year: Never true  Transportation Needs: No Transportation Needs (07/15/2023)   PRAPARE - Administrator, Civil Service (Medical): No    Lack of Transportation (Non-Medical): No  Physical Activity: Unknown (04/05/2017)   Exercise Vital Sign    Days of Exercise per Week: Patient declined    Minutes of Exercise per Session: Patient declined  Stress: No Stress Concern Present (04/05/2017)   [employer] of Occupational Health - Occupational Stress Questionnaire  Feeling of Stress : Not at all  Social Connections: Moderately Integrated (07/15/2023)   Social Connection and Isolation Panel    Frequency of Communication with Friends and Family: More than three times a week    Frequency of Social Gatherings with Friends and Family: More than three times a week    Attends Religious Services: More than 4 times per year    Active Member of [employer] or Organizations: No    Attends Banker Meetings: Never    Marital Status: Married  Catering manager Violence: Not At Risk (07/15/2023)   Humiliation, Afraid, Rape, and Kick questionnaire    Fear of Current or Ex-Partner: No    Emotionally Abused: No    Physically Abused: No    Sexually Abused: No    Family History  Problem Relation Age of Onset   Parkinson's disease Mother    Heart disease Father    Bladder Cancer Father    Lung cancer Father    Heart disease Maternal Grandfather      Current Outpatient Medications:    acetaminophen   (TYLENOL ) 500 MG tablet, Take 500 mg by mouth daily as needed for moderate pain or headache. , Disp: , Rfl:    [Paused] amLODipine  (NORVASC ) 10 MG tablet, Take 10 mg by mouth daily., Disp: , Rfl:    apixaban  (ELIQUIS ) 5 MG TABS tablet, Take 1 tablet (5 mg total) by mouth 2 (two) times daily., Disp: 90 tablet, Rfl: 2   APIXABAN  (ELIQUIS ) VTE STARTER PACK (10MG  AND 5MG ), Take as directed on package: start with two-5mg  tablets twice daily for 7 days. On day 8, switch to one-5mg  tablet twice daily., Disp: 74 each, Rfl: 0   atorvastatin  (LIPITOR) 40 MG tablet, Take 40 mg by mouth at bedtime., Disp: , Rfl:    Calcium  Carb-Cholecalciferol (CALCIUM  600/VITAMIN D3 PO), Take 1 tablet by mouth daily., Disp: , Rfl:    chlorhexidine  (PERIDEX ) 0.12 % solution, Use as directed 15 mLs in the mouth or throat 2 (two) times daily. After breakfast and before bedtime, Disp: , Rfl:    isosorbide  mononitrate (IMDUR ) 30 MG 24 hr tablet, Take 1 tablet (30 mg total) by mouth daily., Disp: 90 tablet, Rfl: 3   letrozole  (FEMARA ) 2.5 MG tablet, Take 1 tablet by mouth once daily, Disp: 90 tablet, Rfl: 0   lidocaine -prilocaine  (EMLA ) cream, Apply 1 application. topically as needed (port access)., Disp: 30 g, Rfl: 2   [Paused] losartan  (COZAAR ) 50 MG tablet, Take 1 tablet by mouth once a day  for high blood pressure- to replace 100 mg, Disp: , Rfl:    metoprolol  succinate (TOPROL -XL) 25 MG 24 hr tablet, Take 1 tablet by mouth once daily, Disp: 90 tablet, Rfl: 3   neomycin -bacitracin -polymyxin (NEOSPORIN) OINT, Apply 1 Application topically daily., Disp: , Rfl:    nitroGLYCERIN  (NITROSTAT ) 0.4 MG SL tablet, Place 1 tablet (0.4 mg total) under the tongue every 5 (five) minutes x 3 doses as needed for chest pain., Disp: 30 tablet, Rfl: 0   ondansetron  (ZOFRAN ) 4 MG tablet, TAKE 1 TABLET BY MOUTH EVERY 8 HOURS AS NEEDED FOR NAUSEA OR VOMITING, Disp: 30 tablet, Rfl: 0   palbociclib  (IBRANCE ) 75 MG tablet, Take 1 tablet (75 mg total) by  mouth daily. Take for 21 days on, 7 days off, repeat every 28 days., Disp: 21 tablet, Rfl: 3  Physical exam:  Vitals:   12/21/23 1318 12/21/23 1331  BP: (!) 148/48 135/71  Pulse: 67 63  Resp: 18  Temp: (!) 96.9 F (36.1 C)   TempSrc: Tympanic   SpO2: 100%   Weight: 193 lb (87.5 kg)   Height: 5' 2 (1.575 m)    Physical Exam Cardiovascular:     Rate and Rhythm: Normal rate and regular rhythm.     Heart sounds: Normal heart sounds.  Pulmonary:     Effort: Pulmonary effort is normal.     Breath sounds: Normal breath sounds.  Skin:    General: Skin is warm and dry.  Neurological:     Mental Status: She is alert and oriented to person, place, and time.      I have personally reviewed labs listed below:    Latest Ref Rng & Units 12/09/2023    9:45 AM  CMP  Creatinine 0.44 - 1.00 mg/dL 8.89       Latest Ref Rng & Units 12/21/2023   12:41 PM  CBC  WBC 4.0 - 10.5 K/uL 2.5   Hemoglobin 12.0 - 15.0 g/dL 88.1   Hematocrit 63.9 - 46.0 % 32.9   Platelets 150 - 400 K/uL 124    I have personally reviewed Radiology images listed below: No images are attached to the encounter.  CT ABDOMEN PELVIS W CONTRAST Result Date: 12/11/2023 CLINICAL DATA:  Metastatic breast cancer, Malignant neoplasm metastatic to bone, * Tracking Code: BO * EXAM: CT ABDOMEN AND PELVIS WITH CONTRAST TECHNIQUE: Multidetector CT imaging of the abdomen and pelvis was performed using the standard protocol following bolus administration of intravenous contrast. RADIATION DOSE REDUCTION: This exam was performed according to the departmental dose-optimization program which includes automated exposure control, adjustment of the mA and/or kV according to patient size and/or use of iterative reconstruction technique. CONTRAST:  OMNIPAQUE  IOHEXOL  300 MG/ML  SOLN COMPARISON:  07/15/2023 FINDINGS: Lower chest: No acute abnormality. Moderate coronary artery calcification. Mild cardiomegaly. Hepatobiliary: Cholelithiasis  without superimposed pericholecystic inflammatory change. Moderate hepatic steatosis. Liver otherwise unremarkable; no enhancing intrahepatic mass identified. No intra or extrahepatic biliary ductal dilation. Pancreas: Unremarkable Spleen: Unremarkable Adrenals/Urinary Tract: Adrenal glands are unremarkable. Kidneys are normal, without renal calculi, focal lesion, or hydronephrosis. Bladder is unremarkable. Stomach/Bowel: Severe descending and sigmoid diverticulosis. Stomach, small bowel, and large bowel are otherwise unremarkable. Appendix absent. No evidence of obstruction or focal inflammation. No free intraperitoneal gas or fluid. Vascular/Lymphatic: Aortic atherosclerosis. No enlarged abdominal or pelvic lymph nodes. Reproductive: Prostate is unremarkable. Other: No abdominal wall hernia or abnormality. No abdominopelvic ascites. Musculoskeletal: Widespread sclerotic metastases seen throughout the visualized axial skeleton appear grossly stable since prior examination. No pathologic fracture identified IMPRESSION: 1. Stable widespread sclerotic metastases throughout the visualized axial skeleton. No pathologic fracture identified. 2. Moderate coronary artery calcification. Mild cardiomegaly. 3. Moderate hepatic steatosis. 4. Cholelithiasis. 5. Severe distal colonic diverticulosis without superimposed acute inflammatory change. 6. Aortic atherosclerosis. Aortic Atherosclerosis (ICD10-I70.0). Electronically Signed   By: Dorethia Molt M.D.   On: 12/11/2023 01:29   NM Bone Scan Whole Body Result Date: 12/11/2023 CLINICAL DATA:  Metastatic breast cancer EXAM: NUCLEAR MEDICINE WHOLE BODY BONE SCAN TECHNIQUE: Whole body anterior and posterior images were obtained approximately 3 hours after intravenous injection of radiopharmaceutical. RADIOPHARMACEUTICALS:  21.91 mCi Technetium-26m MDP IV COMPARISON:  CT chest, abdomen, and pelvis 07/15/2023, prior bone scan 04/20/2023, 10/07/2022, 10/06/2021 FINDINGS: Extensive  uptake within the right maxilla is again identified and may relate to prior report dental procedure or reflect chronic periodontal disease. Uptake within the shoulders, sternoclavicular articulations, right elbow, lumbar spine, knees, and ankles bilaterally are in keeping  with degenerative change. Mild focal uptake within the left greater trochanter appears stable from multiple prior examinations and is nonspecific. No new foci of increased radiotracer deposition. The widespread sclerotic metastases normal prior CT examination appear scintigraphically quiescent. Normal soft tissue distribution. Normal uptake and excretion within the kidneys and bladder. IMPRESSION: Known widespread sclerotic metastases appear quiescent, similar to prior examination. No new foci of radiotracer uptake. Extensive uptake right maxilla again identified in keeping with postsurgical changes or periodontal disease. This could be better assessed with maxillofacial CT imaging if indicated. Electronically Signed   By: Dorethia Molt M.D.   On: 12/11/2023 01:25     Assessment and plan- Patient is a 83 y.o. female with metastatic ER/PR positive HER2 negative breast cancer with bone only metastases presently on letrozole  plus Ibrance  here for routine follow-up  Patient has been on letrozole  and Ibrance  combination since June 2018.  I have reviewed CT chest abdomen and pelvis images and bone scan images independently and discussed findings with the patient which does not show any evidence of recurrent or progressive disease.  Stable areas of bony metastases.  She was noted to have extensive uptake in the right maxilla which has been chronic.  She is not on any bisphosphonates at this time.  Patient will continue with letrozole  plus Ibrance  until progression or toxicity.  She did have bone density scan in May 2025 which was normal.  She has mild drug-induced leukopenia/neutropenia but ANC remains more than 1 and she will continue with  Ibrance  at 75 mg dosing.  History of right lower extremity DVT and pulmonary embolism: Continue Eliquis    Visit Diagnosis 1. High risk medication use   2. Use of letrozole  (Femara )   3. Current use of long term anticoagulation   4. Malignant neoplasm of upper-outer quadrant of left breast in female, estrogen receptor positive (HCC)      Dr. Annah Skene, MD, MPH [facility] at [facility] [phone] 12/21/2023 1:38 PM

## 2023-12-21 NOTE — Progress Notes (Signed)
 Patient doing okay, has a few things she wants to speak with Dr. Melanee about.

## 2023-12-22 ENCOUNTER — Other Ambulatory Visit

## 2023-12-22 ENCOUNTER — Ambulatory Visit: Admitting: Oncology

## 2023-12-23 ENCOUNTER — Encounter: Payer: Self-pay | Admitting: Cardiology

## 2023-12-23 ENCOUNTER — Ambulatory Visit: Admitting: Cardiology

## 2023-12-23 VITALS — BP 136/52 | HR 67 | Ht 62.0 in | Wt 192.4 lb

## 2023-12-23 DIAGNOSIS — R600 Localized edema: Secondary | ICD-10-CM

## 2023-12-23 DIAGNOSIS — I214 Non-ST elevation (NSTEMI) myocardial infarction: Secondary | ICD-10-CM | POA: Diagnosis not present

## 2023-12-23 DIAGNOSIS — I25110 Atherosclerotic heart disease of native coronary artery with unstable angina pectoris: Secondary | ICD-10-CM

## 2023-12-23 DIAGNOSIS — E785 Hyperlipidemia, unspecified: Secondary | ICD-10-CM

## 2023-12-23 DIAGNOSIS — R0609 Other forms of dyspnea: Secondary | ICD-10-CM | POA: Diagnosis not present

## 2023-12-23 DIAGNOSIS — Z79899 Other long term (current) drug therapy: Secondary | ICD-10-CM

## 2023-12-23 DIAGNOSIS — I25118 Atherosclerotic heart disease of native coronary artery with other forms of angina pectoris: Secondary | ICD-10-CM

## 2023-12-23 DIAGNOSIS — I10 Essential (primary) hypertension: Secondary | ICD-10-CM

## 2023-12-23 DIAGNOSIS — I2699 Other pulmonary embolism without acute cor pulmonale: Secondary | ICD-10-CM

## 2023-12-23 DIAGNOSIS — I25119 Atherosclerotic heart disease of native coronary artery with unspecified angina pectoris: Secondary | ICD-10-CM

## 2023-12-23 LAB — CANCER ANTIGEN 27.29: CA 27.29: 93.5 U/mL — ABNORMAL HIGH (ref 0.0–38.6)

## 2023-12-23 MED ORDER — NITROGLYCERIN 0.4 MG SL SUBL: 0.4000 mg | SUBLINGUAL_TABLET | SUBLINGUAL | 0 refills | Status: AC | PRN

## 2023-12-23 NOTE — Patient Instructions (Signed)
 Medication Instructions:  Your physician recommends that you continue on your current medications as directed. Please refer to the Current Medication list given to you today.   *If you need a refill on your cardiac medications before your next appointment, please call your pharmacy*  Lab Work: Your provider would like for you to return in December to have the following labs drawn: Hepatic Function Panel, and Lipid Panel.   Please go to [facility] [address] ([facility] [address], [place] [address] You do not need an appointment.  They are open from 8 am- 4:30 pm.  Lunch from 1:00 pm- 2:00 pm You DO need to be fasting.   If you have labs (blood work) drawn today and your tests are completely normal, you will receive your results only by: MyChart Message (if you have MyChart) OR A paper copy in the mail If you have any lab test that is abnormal or we need to change your treatment, we will call you to review the results.  Testing/Procedures: None ordered at this time   Follow-Up: At [facility], you and your health needs are our priority.  As part of our continuing mission to provide you with exceptional heart care, our providers are all part of one team.  This team includes your primary Cardiologist (physician) and Advanced Practice Providers or APPs (Physician Assistants and Nurse Practitioners) who all work together to provide you with the care you need, when you need it.  Your next appointment:   1 year(s)  Provider:   You may see Alm Clay, MD or one of the following Advanced Practice Providers on your designated Care Team:   Lonni Meager, NP Lesley Maffucci, PA-C Bernardino Bring, PA-C Cadence [place], PA-C Tylene Lunch, NP Barnie Hila, NP    We recommend signing up for the patient portal called MyChart.  Sign up information is provided on this After Visit Summary.  MyChart is used to connect with patients for Virtual Visits (Telemedicine).   Patients are able to view lab/test results, encounter notes, upcoming appointments, etc.  Non-urgent messages can be sent to your provider as well.   To learn more about what you can do with MyChart, go to [email or web address].

## 2023-12-23 NOTE — Progress Notes (Signed)
 Cardiology Office Note:  .   Date:  12/27/2023  ID:  Victoria Steele, DOB Jun 24, 1940, MRN [id number] PCP: Kandis Stefano Iles, MD  [facility] Providers Cardiologist:  Alm Clay, MD     Chief Complaint  Patient presents with   12 month follow up     Patient c/o shortness of breath with over exertion.    Coronary Artery Disease    No angina    Patient Profile: .     Victoria Steele is a 83 y.o. female with a PMH noted below who presents here for annual f/u at the request of Bender, Stefano Iles, MD.  PMH Notable for PAD, HTN, Metastatic Breast Cancer, prior PE (history of thrombectomy; on Eliquis ), and 3V  CAD (initially noted on coronary CTA, prior to presenting with a non-STEM => multivessel PCI I prior to planned cardiac cath-found to have two-vessel disease treated with PCI x 2 (LAD and LCx with RCA CTO.   Victoria Steele was last seen on November 13, 2021 as a posthospital follow-up from her non-STEMI/PCI.  No further angina.  Some musculoskeletal chest wall pain in the left upper chest and right upper chest thought related to bony metastases.  Also noted some hair loss.  Otherwise doing well from a cardiac standpoint.  No melena, hematochezia, hematuria epistaxis.  Energy level also improved.  Still some mild baseline dyspnea but no significant exertional dyspnea.  No PND, orthopnea or edema. Was treated with 1 month DAPT plus Eliquis  then 1 complete year of Plavix  plus Eliquis .  After that we will plan to switch to Eliquis  alone.  She appropriately stopped her Plavix  in July. Continued on Imdur , Toprol  and losartan .  Consider restarting HCTZ if blood pressure elevated.      Victoria Steele was last seen in Aug 2024:   Subjective  Discussed the use of AI scribe software for clinical note transcription with the patient, who gave verbal consent to proceed.  History of Present Illness Victoria Steele is an 83 year old female with breast cancer and coronary artery disease who  presents for follow-up of her cardiac and oncological conditions.  She is currently undergoing treatment for breast cancer with bone metastasis. Her regimen includes Ibrance  and letrozole , and her cancer remains stable with regular CT and PET scans showing no new developments.  In July 2023, she experienced a myocardial infarction, leading to the placement of two stents.  With her history of recurrent VTE/PE, she was initially on Xarelto but switched to Eliquis , taken twice daily for anticoagulation.  She reports occasional mild chest pressure, but denies significant chest pain or shortness of breath during daily activities. However, she does experience shortness of breath with exertion and reports swelling in her ankles that resolves with elevation. No episodes of atrial fibrillation, palpitations, dizziness, or syncope. No recent episodes of deep pressure similar to her myocardial infarction. She carries nitroglycerin  but has not needed to use it recently.  Her blood pressure typically ranges from 130s to 150s systolic, and she is on losartan  50 mg daily. Her blood pressure was 135/70 at her last oncology visit.  No bleeding issues such as hematochezia, hematuria, or epistaxis. No neurological symptoms such as sudden weakness, numbness, dysarthria, or vision changes.  She also takes amlodipine  10 mg daily for hypertension, atorvastatin  40 mg daily for hyperlipidemia, and isosorbide  mononitrate 30 mg daily.      Objective   Current Meds  Medication Sig   amLODipine  (NORVASC ) 10  MG tablet Take 10 mg by mouth daily.   apixaban  (ELIQUIS ) 5 MG TABS tablet Take 1 tablet (5 mg total) by mouth 2 (two) times daily.   atorvastatin  (LIPITOR) 40 MG tablet Take 40 mg by mouth at bedtime.   isosorbide  mononitrate (IMDUR ) 30 MG 24 hr tablet Take 1 tablet (30 mg total) by mouth daily.   losartan  (COZAAR ) 50 MG tablet Take 1 tablet by mouth once a day  for high blood pressure- to replace 100 mg    metoprolol  succinate (TOPROL -XL) 25 MG 24 hr tablet Take 1 tablet by mouth once daily   [Refilled] nitroGLYCERIN  (NITROSTAT ) 0.4 MG SL tablet Place 1 tablet (0.4 mg total) under the tongue every 5 (five) minutes x 3 doses as needed for chest pain.    acetaminophen  (TYLENOL ) 500 MG tablet Take 500 mg by mouth daily as needed for moderate pain or headache.    Calcium  Carb-Cholecalciferol (CALCIUM  600/VITAMIN D3 PO) Take 1 tablet by mouth daily.   chlorhexidine  (PERIDEX ) 0.12 % solution Use as directed 15 mLs in the mouth or throat 2 (two) times daily. After breakfast and before bedtime   letrozole  (FEMARA ) 2.5 MG tablet Take 1 tablet by mouth once daily   lidocaine -prilocaine  (EMLA ) cream Apply 1 application. topically as needed (port access).   ondansetron  (ZOFRAN ) 4 MG tablet TAKE 1 TABLET BY MOUTH EVERY 8 HOURS AS NEEDED FOR NAUSEA OR VOMITING   palbociclib  (IBRANCE ) 75 MG tablet Take 1 tablet (75 mg total) by mouth daily. Take for 21 days on, 7 days off, repeat every 28 days.    Studies Reviewed: SABRA   EKG Interpretation Date/Time:  Thursday December 23 2023 09:47:27 EDT Ventricular Rate:  67 PR Interval:  168 QRS Duration:  134 QT Interval:  468 QTC Calculation: 494 R Axis:   42  Text Interpretation: Normal sinus rhythm Right bundle branch block When compared with ECG of 15-Jul-2023 02:06, No significant change was found Confirmed by Anner Lenis ([id number]) on 12/23/2023 9:54:34 AM    Lab Results  Component Value Date   NA 134 (L) 12/21/2023   K 3.9 12/21/2023   CREATININE 1.01 (H) 12/21/2023   GFRNONAA 56 (L) 12/21/2023   GLUCOSE 174 (H) 12/21/2023   Lab Results  Component Value Date   CHOL 150 12/03/2022   HDL 53 12/03/2022   LDLCALC 70 12/03/2022   TRIG 161 (H) 12/03/2022   CHOLHDL 2.8 12/03/2022   Lab Results  Component Value Date   WBC 2.5 (L) 12/21/2023   HGB 11.8 (L) 12/21/2023   HCT 32.9 (L) 12/21/2023   MCV 107.9 (H) 12/21/2023   PLT 124 (L) 12/21/2023     ECHO: 07/16/2023 1. Left ventricular ejection fraction, by estimation, is 60 to 65%. Left ventricular ejection fraction by 2D MOD biplane is 68.4 %. Left ventricular ejection fraction by PLAX is 65 %. The left ventricle has normal function. The left ventricle has no regional wall motion abnormalities. There is mild left ventricular hypertrophy. Left ventricular diastolic parameters are consistent with Grade I diastolic dysfunction (impaired relaxation).   2. Right ventricular systolic function is normal. The right ventricular size is normal.   3. The mitral valve is normal in structure. No evidence of mitral valve regurgitation.   4. The aortic valve is grossly normal. Aortic valve regurgitation is not visualized.   5. The inferior vena cava is normal in size with greater than 50% respiratory variability, suggesting right atrial pressure of 3 mmHg.     LEFT  HEART CATH AND CORONARY ANGIOGRAPHY; Surgeon: Anner Alm ORN, MD;  Location: [facility] INVASIVE CV LAB:: mRCA 100% CTO (L-R  & bridging collaterals); prox LAD 75%( DES PCI) - dLAD-rDPDA colleterals; mLCx 75% just proximal to the OM 2 (DES PCI).  Normal EDP. 10/16/2021        CORONARY STENT INTERVENTION :Surgeon: Anner Alm ORN, MD;  Location: [facility] INVASIVE CV LAB: Prox LAD 75%->0% Onyx Frontier DES 3 x 15 (3.3); Mid LCx 75% (prox to OM2) -> Onyx Frontier DES 2.5 x 18 -> tapered 2.8 to 2.6 mm 10/16/2021           Risk Assessment/Calculations:         Physical Exam:   VS:  BP (!) 136/52   Pulse 67   Ht 5' 2 (1.575 m)   Wt 192 lb 6 oz (87.3 kg)   SpO2 98%   BMI 35.19 kg/m    Wt Readings from Last 3 Encounters:  12/23/23 192 lb 6 oz (87.3 kg)  12/21/23 193 lb (87.5 kg)  09/13/23 190 lb 1.6 oz (86.2 kg)      GEN: Well nourished, well groomed in no acute distress; moderately obese, but relatively healthy appearing based on her age and medical history. NECK: No JVD; No carotid bruits CARDIAC: Normal S1, S2; RRR, no murmurs, rubs,  gallops RESPIRATORY:  Clear to auscultation without rales, wheezing or rhonchi ; nonlabored, good air movement. ABDOMEN: Soft, non-tender, non-distended EXTREMITIES:  No edema; No deformity      ASSESSMENT AND PLAN: .    Problem List Items Addressed This Visit       Cardiology Problems   Coronary artery disease involving native coronary artery of native heart with angina pectoris (HCC) - Primary (Chronic)   Abnormal Coronary CTA following non-STEMI found to have RCA CTO.  Mid LAD and LCx PCI.  Lateral right collaterals fill distal RCA. She only notes having dyspnea or some chest tightness with vigorous exertion.  Has done well with Imdur  at current dose along with zotepine and Toprol  for antianginal management.  - Continue Toprol  25 mg daily along with amlodipine  10 mg daily and Imdur  at 30 mg daily - Continue losartan  at 50 mg daily for afterload reduction but low threshold to increase if blood pressures continue to be. - She is not on aspirin  or Plavix  anymore because she is on longstanding Eliquis  5 mg twice daily - Continue 40 mg Lipitor for management.      Relevant Medications   nitroGLYCERIN  (NITROSTAT ) 0.4 MG SL tablet   Essential hypertension (Chronic)   Blood pressure ranges from 130s to 150s systolic. Current regimen includes amlodipine  and losartan . Discussed potential losartan  increase if hypertension persists, but prefer to avoid due to age. - Continue current antihypertensive regimen: Amlodipine  10 mg daily, losartan  50 mL daily, Toprol  25 mg daily and Imdur  30 mg daily - Consider increasing losartan  to 100 mg if blood pressure remains high.      Relevant Medications   nitroGLYCERIN  (NITROSTAT ) 0.4 MG SL tablet   Other Relevant Orders   EKG 12-Lead (Completed)   Hyperlipidemia with target low density lipoprotein (LDL) cholesterol less than 55 mg/dL (Chronic)   Cholesterol levels need re-evaluation. Last checked a year ago, within acceptable range but not optimal. On  atorvastatin  40 mg daily. - Order fasting cholesterol panel with next oncology blood work. - Continue atorvastatin  40 mg daily.      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 I no active angina or heart failure symptoms.      Relevant Medications   nitroGLYCERIN  (NITROSTAT ) 0.4 MG SL tablet   Other Relevant Orders   EKG 12-Lead (Completed)   PE (pulmonary thromboembolism) (HCC)   With ongoing malignancy and history of VTE-PE she is now on long-term Eliquis  5 mg twice daily managed by oncology. -Would defer to oncology, but likely okay to hold 2 to 3 days preop for surgeries or procedures.      Relevant Medications   nitroGLYCERIN  (NITROSTAT ) 0.4 MG SL tablet     Other   Bilateral lower extremity edema (Chronic)   Mild pitting edema resolves with leg elevation, likely venous insufficiency especially in light of history of VTE. - Continue leg elevation to manage edema.      DOE (dyspnea on exertion) (Chronic)   She is quite deconditioned although she seems to be plugging along with her cancer therapy.  I think a lot of this is due to deconditioning, obesity and some diastolic dysfunction.  Echo was pretty reassuring with only grade 1 diastolic dysfunction  Continue to encourage exercise.      Relevant Orders   EKG 12-Lead (Completed)   Other Visit Diagnoses       Medication management       Relevant Orders   Hepatic function panel   Lipid panel               Follow-Up: Return in about 1 year (around 12/22/2024) for [place] office.  I spent 41 minutes in the care of Victoria Steele today including reviewing labs (1 minute), reviewing studies (cath films and echo images reviewed-5 minutes), face to face time discussing treatment options (19), reviewing records  from previous clinic notes including oncology notes (6 minutes), 10 minutes dictating, and documenting in the encounter.      Signed, Alm MICAEL Clay, MD, MS Alm Clay, M.D., M.S. Interventional Cardiologist  [facility] Pager # [phone]

## 2023-12-26 ENCOUNTER — Encounter: Payer: Self-pay | Admitting: Cardiology

## 2023-12-26 NOTE — Progress Notes (Incomplete)
 Cardiology Office Note:  .   Date:  12/23/2023  ID:  Victoria Steele, DOB 1941-03-21, MRN [id number] PCP: Kandis Stefano Iles, MD  [facility] Providers Cardiologist:  Alm Clay, MD { Click to update primary MD,subspecialty MD or APP then REFRESH:1}    Chief Complaint  Patient presents with  . 12 month follow up     Patient c/o shortness of breath with over exertion.     Patient Profile: .     Victoria Steele is a 83 y.o. female with a PMH noted below who presents here for annual f/u at the request of Bender, Stefano Iles, MD.  PMH Notable for PAD, HTN, Metastatic Breast Cancer, prior PE (history of thrombectomy; on Eliquis ), and 3V  CAD (initially noted on coronary CTA, prior to presenting with a non-STEM => multivessel PCI I prior to planned cardiac cath-found to have two-vessel disease treated with PCI x 2 (LAD and LCx with RCA CTO.   Victoria Steele was last seen on November 13, 2021 as a posthospital follow-up from her non-STEMI/PCI.  No further angina.  Some musculoskeletal chest wall pain in the left upper chest and right upper chest thought related to bony metastases.  Also noted some hair loss.  Otherwise doing well from a cardiac standpoint.  No melena, hematochezia, hematuria epistaxis.  Energy level also improved.  Still some mild baseline dyspnea but no significant exertional dyspnea.  No PND, orthopnea or edema. Was treated with 1 month DAPT plus Eliquis  then 1 complete year of Plavix  plus Eliquis .  After that we will plan to switch to Eliquis  alone.  She appropriately stopped her Plavix  in July. Continued on Imdur , Toprol  and losartan .  Consider restarting HCTZ if blood pressure elevated.      [name] was last seen in Aug 2024:   Subjective  Discussed the use of AI scribe software for clinical note transcription with the patient, who gave verbal consent to proceed.  History of Present Illness Victoria Steele is an 83 year old female with breast cancer and  coronary artery disease who presents for follow-up of her cardiac and oncological conditions.  She is currently undergoing treatment for breast cancer with bone metastasis. Her regimen includes Ibrance  and letrozole , and her cancer remains stable with regular CT and PET scans showing no new developments.  In July 2023, she experienced a myocardial infarction, leading to the placement of two stents.  With her history of recurrent VTE/PE, she was initially on Xarelto but switched to Eliquis , taken twice daily for anticoagulation.  She reports occasional mild chest pressure, but denies significant chest pain or shortness of breath during daily activities. However, she does experience shortness of breath with exertion and reports swelling in her ankles that resolves with elevation. No episodes of atrial fibrillation, palpitations, dizziness, or syncope. No recent episodes of deep pressure similar to her myocardial infarction. She carries nitroglycerin  but has not needed to use it recently.  Her blood pressure typically ranges from 130s to 150s systolic, and she is on losartan  50 mg daily. Her blood pressure was 135/70 at her last oncology visit.  No bleeding issues such as hematochezia, hematuria, or epistaxis. No neurological symptoms such as sudden weakness, numbness, dysarthria, or vision changes.  She also takes amlodipine  10 mg daily for hypertension, atorvastatin  40 mg daily for hyperlipidemia, and isosorbide  mononitrate 30 mg daily.      Objective   Current Meds  Medication Sig  . amLODipine  (NORVASC ) 10  MG tablet Take 10 mg by mouth daily.  . apixaban  (ELIQUIS ) 5 MG TABS tablet Take 1 tablet (5 mg total) by mouth 2 (two) times daily.  . atorvastatin  (LIPITOR) 40 MG tablet Take 40 mg by mouth at bedtime.  . isosorbide  mononitrate (IMDUR ) 30 MG 24 hr tablet Take 1 tablet (30 mg total) by mouth daily.  . losartan  (COZAAR ) 50 MG tablet Take 1 tablet by mouth once a day  for high blood  pressure- to replace 100 mg  . metoprolol  succinate (TOPROL -XL) 25 MG 24 hr tablet Take 1 tablet by mouth once daily  . [Refilled] nitroGLYCERIN  (NITROSTAT ) 0.4 MG SL tablet Place 1 tablet (0.4 mg total) under the tongue every 5 (five) minutes x 3 doses as needed for chest pain.   . acetaminophen  (TYLENOL ) 500 MG tablet Take 500 mg by mouth daily as needed for moderate pain or headache.   . Calcium  Carb-Cholecalciferol (CALCIUM  600/VITAMIN D3 PO) Take 1 tablet by mouth daily.  . chlorhexidine  (PERIDEX ) 0.12 % solution Use as directed 15 mLs in the mouth or throat 2 (two) times daily. After breakfast and before bedtime  . letrozole  (FEMARA ) 2.5 MG tablet Take 1 tablet by mouth once daily  . lidocaine -prilocaine  (EMLA ) cream Apply 1 application. topically as needed (port access).  . ondansetron  (ZOFRAN ) 4 MG tablet TAKE 1 TABLET BY MOUTH EVERY 8 HOURS AS NEEDED FOR NAUSEA OR VOMITING  . palbociclib  (IBRANCE ) 75 MG tablet Take 1 tablet (75 mg total) by mouth daily. Take for 21 days on, 7 days off, repeat every 28 days.    Studies Reviewed: SABRA   EKG Interpretation Date/Time:  Thursday December 23 2023 09:47:27 EDT Ventricular Rate:  67 PR Interval:  168 QRS Duration:  134 QT Interval:  468 QTC Calculation: 494 R Axis:   42  Text Interpretation: Normal sinus rhythm Right bundle branch block When compared with ECG of 15-Jul-2023 02:06, No significant change was found Confirmed by Anner Lenis ([id number]) on 12/23/2023 9:54:34 AM    Lab Results  Component Value Date   NA 134 (L) 12/21/2023   K 3.9 12/21/2023   CREATININE 1.01 (H) 12/21/2023   GFRNONAA 56 (L) 12/21/2023   GLUCOSE 174 (H) 12/21/2023   Lab Results  Component Value Date   CHOL 150 12/03/2022   HDL 53 12/03/2022   LDLCALC 70 12/03/2022   TRIG 161 (H) 12/03/2022   CHOLHDL 2.8 12/03/2022   Lab Results  Component Value Date   WBC 2.5 (L) 12/21/2023   HGB 11.8 (L) 12/21/2023   HCT 32.9 (L) 12/21/2023   MCV 107.9 (H)  12/21/2023   PLT 124 (L) 12/21/2023    ECHO: 07/16/2023 1. Left ventricular ejection fraction, by estimation, is 60 to 65%. Left ventricular ejection fraction by 2D MOD biplane is 68.4 %. Left ventricular ejection fraction by PLAX is 65 %. The left ventricle has normal function. The left ventricle has no regional wall motion abnormalities. There is mild left ventricular hypertrophy. Left ventricular diastolic parameters are consistent with Grade I diastolic dysfunction (impaired relaxation).   2. Right ventricular systolic function is normal. The right ventricular size is normal.   3. The mitral valve is normal in structure. No evidence of mitral valve regurgitation.   4. The aortic valve is grossly normal. Aortic valve regurgitation is not visualized.   5. The inferior vena cava is normal in size with greater than 50% respiratory variability, suggesting right atrial pressure of 3 mmHg.    SABRA LEFT  HEART CATH AND CORONARY ANGIOGRAPHY; Surgeon: Anner Alm ORN, MD;  Location: [facility] INVASIVE CV LAB:: mRCA 100% CTO (L-R  & bridging collaterals); prox LAD 75%( DES PCI) - dLAD-rDPDA colleterals; mLCx 75% just proximal to the OM 2 (DES PCI).  Normal EDP. 10/16/2021       . CORONARY STENT INTERVENTION :Surgeon: Anner Alm ORN, MD;  Location: [facility] INVASIVE CV LAB: Prox LAD 75%->0% Onyx Frontier DES 3 x 15 (3.3); Mid LCx 75% (prox to OM2) -> Onyx Frontier DES 2.5 x 18 -> tapered 2.8 to 2.6 mm 10/16/2021           Risk Assessment/Calculations:             Physical Exam:   VS:  BP (!) 136/52   Pulse 67   Ht 5' 2 (1.575 m)   Wt 192 lb 6 oz (87.3 kg)   SpO2 98%   BMI 35.19 kg/m    Wt Readings from Last 3 Encounters:  12/23/23 192 lb 6 oz (87.3 kg)  12/21/23 193 lb (87.5 kg)  09/13/23 190 lb 1.6 oz (86.2 kg)      GEN: Well nourished, well groomed in no acute distress; moderately obese, but relatively healthy appearing based on her age and medical history. NECK: No JVD; No carotid  bruits CARDIAC: Normal S1, S2; RRR, no murmurs, rubs, gallops RESPIRATORY:  Clear to auscultation without rales, wheezing or rhonchi ; nonlabored, good air movement. ABDOMEN: Soft, non-tender, non-distended EXTREMITIES:  No edema; No deformity      ASSESSMENT AND PLAN: .    Problem List Items Addressed This Visit       Cardiology Problems   Coronary artery disease involving native coronary artery of native heart with unstable angina pectoris (HCC) - Primary (Chronic)   Relevant Orders   EKG 12-Lead (Completed)   Essential hypertension (Chronic)   Relevant Orders   EKG 12-Lead (Completed)   NSTEMI (non-ST elevated myocardial infarction) (HCC) (Chronic)   Relevant Orders   EKG 12-Lead (Completed)     Other   DOE (dyspnea on exertion) (Chronic)   Relevant Orders   EKG 12-Lead (Completed)    Assessment and Plan Assessment & Plan Atherosclerotic heart disease of native coronary artery with prior stent placement and history of NSTEMI History of NSTEMI with two stents in the right coronary artery. No angina or dyspnea. Normal ejection fraction and no heart failure signs. On Eliquis  for anticoagulation. - Continue Eliquis  for anticoagulation. - Continue atorvastatin  40 mg daily. - Continue isosorbide  mononitrate 30 mg daily.  Essential hypertension Blood pressure ranges from 130s to 150s systolic. Current regimen includes amlodipine  and losartan . Discussed potential losartan  increase if hypertension persists, but prefer to avoid due to age. - Continue current antihypertensive regimen. - Consider increasing losartan  if blood pressure remains high.  Hyperlipidemia Cholesterol levels need re-evaluation. Last checked a year ago, within acceptable range but not optimal. On atorvastatin  40 mg daily. - Order fasting cholesterol panel with next oncology blood work. - Continue atorvastatin  40 mg daily.  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 - Continue leg elevation to manage edema.  Recording duration: 19 minutes           Follow-Up: No follow-ups on file.  I spent *** minutes in the care of KELLINA DREESE today including {CHL AMB CAR Time Based Billing Options STW (Optional):[phone]::documenting in the encounter.}      Signed, Alm MICAEL Anner, MD, MS  Alm Clay, M.D., M.S. Interventional Cardiologist  [facility] Pager # [phone]

## 2023-12-27 ENCOUNTER — Telehealth: Payer: Self-pay | Admitting: Cardiology

## 2023-12-27 DIAGNOSIS — R600 Localized edema: Secondary | ICD-10-CM | POA: Insufficient documentation

## 2023-12-27 DIAGNOSIS — E785 Hyperlipidemia, unspecified: Secondary | ICD-10-CM | POA: Insufficient documentation

## 2023-12-27 NOTE — Assessment & Plan Note (Signed)
 Mild pitting edema resolves with leg elevation, likely venous insufficiency especially in light of history of VTE. - Continue leg elevation to manage edema.

## 2023-12-27 NOTE — Assessment & Plan Note (Addendum)
 Abnormal Coronary CTA following non-STEMI found to have RCA CTO.  Mid LAD and LCx PCI.  Lateral right collaterals fill distal RCA. She only notes having dyspnea or some chest tightness with vigorous exertion.  Has done well with Imdur  at current dose along with zotepine and Toprol  for antianginal management.  - Continue Toprol  25 mg daily along with amlodipine  10 mg daily and Imdur  at 30 mg daily - Continue losartan  at 50 mg daily for afterload reduction but low threshold to increase if blood pressures continue to be. - She is not on aspirin  or Plavix  anymore because she is on longstanding Eliquis  5 mg twice daily - Continue 40 mg Lipitor for management.

## 2023-12-27 NOTE — Assessment & Plan Note (Signed)
 Cholesterol levels need re-evaluation. Last checked a year ago, within acceptable range but not optimal. On atorvastatin  40 mg daily. - Order fasting cholesterol panel with next oncology blood work. - Continue atorvastatin  40 mg daily.

## 2023-12-27 NOTE — Assessment & Plan Note (Signed)
 2 years out from non-STEMI preserved LVEF on Echo despite having occluded RCA indicating adequate collateral flow. I no active angina or heart failure symptoms.

## 2023-12-27 NOTE — Telephone Encounter (Signed)
 Notes were faxed from pt's PCP that were previously requested for their appt on 12/23/23. Notes were printed and placed in Dr. Genice nurse box.

## 2023-12-27 NOTE — Assessment & Plan Note (Addendum)
 Blood pressure ranges from 130s to 150s systolic. Current regimen includes amlodipine  and losartan . Discussed potential losartan  increase if hypertension persists, but prefer to avoid due to age. - Continue current antihypertensive regimen: Amlodipine  10 mg daily, losartan  50 mL daily, Toprol  25 mg daily and Imdur  30 mg daily - Consider increasing losartan  to 100 mg if blood pressure remains high.

## 2023-12-27 NOTE — Assessment & Plan Note (Signed)
 She is quite deconditioned although she seems to be plugging along with her cancer therapy.  I think a lot of this is due to deconditioning, obesity and some diastolic dysfunction.  Echo was pretty reassuring with only grade 1 diastolic dysfunction  Continue to encourage exercise.

## 2023-12-27 NOTE — Assessment & Plan Note (Signed)
 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 -Would defer to oncology, but likely okay to hold 2 to 3 days preop for surgeries or procedures.

## 2024-01-10 ENCOUNTER — Other Ambulatory Visit: Payer: Self-pay

## 2024-01-10 ENCOUNTER — Other Ambulatory Visit: Payer: Self-pay | Admitting: Oncology

## 2024-01-10 DIAGNOSIS — C50912 Malignant neoplasm of unspecified site of left female breast: Secondary | ICD-10-CM

## 2024-01-10 NOTE — Progress Notes (Signed)
 Specialty Pharmacy Refill Coordination Note  Victoria Steele is a 83 y.o. female contacted today regarding refills of specialty medication(s) Palbociclib  (IBRANCE )   Patient requested Delivery   Delivery date: 01/17/24   Verified address: [address]  [place] [address]   Medication will be filled on 01/14/24.   This fill date is pending response to refill request from provider. Patient is aware and if they have not received fill by intended date they must follow up with pharmacy.

## 2024-01-11 ENCOUNTER — Encounter: Payer: Self-pay | Admitting: Oncology

## 2024-01-11 ENCOUNTER — Other Ambulatory Visit: Payer: Self-pay

## 2024-01-11 ENCOUNTER — Other Ambulatory Visit (HOSPITAL_COMMUNITY): Payer: Self-pay

## 2024-01-11 MED ORDER — PALBOCICLIB 75 MG PO TABS
75.0000 mg | ORAL_TABLET | Freq: Every day | ORAL | 3 refills | Status: DC
  Filled 2024-01-11: qty 21, 28d supply, fill #0
  Filled 2024-02-04: qty 21, 28d supply, fill #1
  Filled 2024-03-01: qty 21, 28d supply, fill #2
  Filled 2024-04-04: qty 21, 28d supply, fill #3

## 2024-01-13 ENCOUNTER — Other Ambulatory Visit: Payer: Self-pay

## 2024-01-16 ENCOUNTER — Other Ambulatory Visit: Payer: Self-pay | Admitting: Cardiology

## 2024-01-19 ENCOUNTER — Other Ambulatory Visit: Payer: Self-pay

## 2024-01-19 NOTE — Progress Notes (Signed)
 Specialty Pharmacy Ongoing Clinical Assessment Note  Victoria Steele is a 83 y.o. female who is being followed by the specialty pharmacy service for RxSp Oncology   Patient's specialty medication(s) reviewed today: Palbociclib  (IBRANCE )   Missed doses in the last 4 weeks: 0   Patient/Caregiver did not have any additional questions or concerns.   Therapeutic benefit summary: Patient is achieving benefit   Adverse events/side effects summary: No adverse events/side effects   Patient's therapy is appropriate to: Continue    Goals Addressed             This Visit's Progress    Stabilization of disease   On track    Patient is on track. Patient will maintain adherence.  Dr. Melanee described her disease as stable at last office visit on 12/21/23.          Follow up: 6 months  [facility]

## 2024-01-26 ENCOUNTER — Other Ambulatory Visit: Payer: Self-pay | Admitting: Oncology

## 2024-01-26 ENCOUNTER — Other Ambulatory Visit: Payer: Self-pay | Admitting: Cardiology

## 2024-02-04 ENCOUNTER — Other Ambulatory Visit: Payer: Self-pay

## 2024-02-04 NOTE — Progress Notes (Signed)
 Specialty Pharmacy Refill Coordination Note  Victoria Steele is a 83 y.o. female contacted today regarding refills of specialty medication(s) Palbociclib  (IBRANCE )   Patient requested Delivery   Delivery date: 02/11/24   Verified address: [address]  [place] [address]   Medication will be filled on: 02/10/24

## 2024-02-10 ENCOUNTER — Other Ambulatory Visit: Payer: Self-pay

## 2024-02-29 ENCOUNTER — Telehealth: Payer: Self-pay | Admitting: Pharmacy Technician

## 2024-02-29 ENCOUNTER — Other Ambulatory Visit (HOSPITAL_COMMUNITY): Payer: Self-pay

## 2024-02-29 ENCOUNTER — Encounter: Payer: Self-pay | Admitting: Oncology

## 2024-02-29 NOTE — Telephone Encounter (Signed)
 Oral Oncology Patient Advocate Encounter   Was successful in securing patient a $7000 grant from [employer] ([employer]) to provide copayment coverage for Ibrance .  This will keep the out of pocket expense at $0.     The billing information is as follows and has been shared with [facility].   RxBin: [id number] PCN:  [id number] Member ID: [id number] Group ID: [id number] Dates of Eligibility: 09/01/2023 through 02/27/2025    Victoria Steele (Victoria Steele) [name], CPhT  [name], [name], [name] Phone: [phone]  Fax: [phone]

## 2024-03-01 ENCOUNTER — Other Ambulatory Visit: Payer: Self-pay

## 2024-03-01 ENCOUNTER — Other Ambulatory Visit (HOSPITAL_COMMUNITY): Payer: Self-pay

## 2024-03-01 NOTE — Progress Notes (Signed)
 Specialty Pharmacy Refill Coordination Note  Victoria Steele is a 83 y.o. female contacted today regarding refills of specialty medication(s) Palbociclib  (IBRANCE )   Patient requested Delivery   Delivery date: 03/14/24   Verified address: [address]  [place] [address]   Medication will be filled on: 03/13/24

## 2024-03-02 ENCOUNTER — Other Ambulatory Visit: Payer: Self-pay | Admitting: Oncology

## 2024-03-02 DIAGNOSIS — C7951 Secondary malignant neoplasm of bone: Secondary | ICD-10-CM

## 2024-03-08 ENCOUNTER — Other Ambulatory Visit: Payer: Self-pay | Admitting: Oncology

## 2024-03-13 ENCOUNTER — Other Ambulatory Visit: Payer: Self-pay

## 2024-03-13 ENCOUNTER — Encounter: Payer: Self-pay | Admitting: Oncology

## 2024-03-20 ENCOUNTER — Telehealth: Payer: Self-pay | Admitting: Oncology

## 2024-03-20 NOTE — Telephone Encounter (Signed)
 Pt was scheduled for port flush 12/9 and stated she wants to cancel and schedule for January due to the weather. Pt is currently scheduled on 1/20 for port flush.lab and MD. Pt stated she will keep those appts as is.

## 2024-03-21 ENCOUNTER — Inpatient Hospital Stay

## 2024-04-03 ENCOUNTER — Other Ambulatory Visit (HOSPITAL_COMMUNITY): Payer: Self-pay

## 2024-04-04 ENCOUNTER — Other Ambulatory Visit: Payer: Self-pay

## 2024-04-04 NOTE — Progress Notes (Signed)
 Specialty Pharmacy Refill Coordination Note  Victoria Steele is a 83 y.o. female contacted today regarding refills of specialty medication(s) Palbociclib  (IBRANCE )   Patient requested Delivery   Delivery date: 04/12/24   Verified address: [address]  [place] [address]   Medication will be filled on: 04/11/24

## 2024-04-21 ENCOUNTER — Other Ambulatory Visit

## 2024-04-21 ENCOUNTER — Ambulatory Visit: Admitting: Oncology

## 2024-04-25 ENCOUNTER — Other Ambulatory Visit: Payer: Self-pay | Admitting: Oncology

## 2024-04-28 ENCOUNTER — Telehealth: Payer: Self-pay | Admitting: *Deleted

## 2024-04-28 NOTE — Telephone Encounter (Signed)
 Caller verified using pt's full name and dob prior to discussing PHI    Patient called triage. She will be out of her Eliquis  on Tuesday. She is requesting patient assistance and asked to speak to Dickey Pap to see how the cancer center can assist with her medication.

## 2024-04-29 NOTE — Telephone Encounter (Signed)
 Please let patient know of the above and switch her to xarelto  20 mg daily

## 2024-05-01 ENCOUNTER — Telehealth: Payer: Self-pay | Admitting: Pharmacy Technician

## 2024-05-01 ENCOUNTER — Encounter: Payer: Self-pay | Admitting: Oncology

## 2024-05-01 MED ORDER — RIVAROXABAN 20 MG PO TABS: 20.0000 mg | ORAL_TABLET | Freq: Every day | ORAL | 3 refills | Status: DC

## 2024-05-01 NOTE — Telephone Encounter (Signed)
 Outbound call to patient, informed of below. Patient states she has not spoken w/ Dickey Pap; informed her I would reach out to Ms. Pap to touch base with her.  Patient requested script be sent to [facility] in [place].  Script sent.

## 2024-05-01 NOTE — Telephone Encounter (Signed)
 Do we need to send her a prescription for Xarelto  20 mg.  If she is taking a 2.5 mg tablet she will need to take 8 tablets daily

## 2024-05-01 NOTE — Telephone Encounter (Signed)
 Spoke with patient.  Explained that Eliquis  comes from [facility] and they require that patient have spent 3% out-of-pocket on prescription medications in order to receive the medication.  Patient stated that she has not spent that much for this calendar year.  Told patient that we could get Xarelto .  Patient hesitant about taking Xarelto .  Wants to discuss the differences between the two medications with Dr. Melanee tomorrow.  Patient is to see me after talking with Dr. Melanee.  Also, made patient aware that it will take about 2 weeks to obtain the Xarelto .  Patient can obtain a 30 day supply of Xarelto  from [facility] for $127.57 using the Single Care coupon.  However, the discounted rate is only for the 2.5mg  of Xarelto  for a quantity of 240.  Patient would have to take 8 tablets daily of the 2.5mg  to receive the daily dose of 20mg .  Patient stated that she could afford to purchase the Xarelto  2.5mg  and use until we can get the medication from [facility].  Made her medical team aware.  Dickey DOROTHA Fritter Patient Services Navigator [facility]

## 2024-05-01 NOTE — Telephone Encounter (Signed)
 Mercedes please follow-up with this patient and see where we are at with her anticoagulation based on above

## 2024-05-01 NOTE — Telephone Encounter (Addendum)
 Outbound call to [facility] to inquire about pricing information for Xarelto  sent earlier today as well as to inquire if the script has been picked up already.  Spoke to [place] who indicated the 45-day supply costs $358.86; confirmed patient has not picked up yet.  Patient will discuss with Dr. Melanee tomorrow at appointment if she would like to move forward with Xarelto .   Per Dickey Pap she Spoke with patient. Explained that Eliquis  comes from [facility] and they require that patient have spent 3% out-of-pocket on prescription medications in order to receive the medication. Patient stated that she has not spent that much for this calendar year. Told patient that we could get Xarelto . Patient hesitant about taking Xarelto . Wants to discuss the differences between the two medications with Dr. Melanee tomorrow. Patient is to see me after talking with Dr. Melanee. Also, made patient aware that it will take about 2 weeks to obtain the Xarelto . Patient can obtain a 30 day supply of Xarelto  from [facility] for $127.57 using the Single Care coupon. However, the discounted rate is only for the 2.5mg  of Xarelto  for a quantity of 240. Patient would have to take 8 tablets daily of the 2.5mg  to receive the daily dose of 20mg . Patient stated that she could afford to purchase the Xarelto  2.5mg  and use until we can get the medication from [facility].  If patient decides to go the route of the Xarelto , we will need to [facility] a [facility] 2.5mg  of Xarelto , take 8 tablets daily with a quantity of 240. This is to bridge the patient until we can get the 20mg  Xarelto  from the pharmaceutical company [employer].

## 2024-05-02 ENCOUNTER — Other Ambulatory Visit: Payer: Self-pay | Admitting: Oncology

## 2024-05-02 ENCOUNTER — Encounter: Payer: Self-pay | Admitting: Oncology

## 2024-05-02 ENCOUNTER — Inpatient Hospital Stay: Admitting: Oncology

## 2024-05-02 ENCOUNTER — Inpatient Hospital Stay: Attending: Oncology

## 2024-05-02 ENCOUNTER — Other Ambulatory Visit: Payer: Self-pay

## 2024-05-02 VITALS — BP 133/68 | HR 63 | Temp 99.0°F | Resp 19 | Ht 62.0 in | Wt 193.1 lb

## 2024-05-02 DIAGNOSIS — C7951 Secondary malignant neoplasm of bone: Secondary | ICD-10-CM | POA: Diagnosis not present

## 2024-05-02 DIAGNOSIS — D702 Other drug-induced agranulocytosis: Secondary | ICD-10-CM | POA: Diagnosis not present

## 2024-05-02 DIAGNOSIS — Z79899 Other long term (current) drug therapy: Secondary | ICD-10-CM | POA: Diagnosis not present

## 2024-05-02 DIAGNOSIS — C50412 Malignant neoplasm of upper-outer quadrant of left female breast: Secondary | ICD-10-CM

## 2024-05-02 DIAGNOSIS — I82511 Chronic embolism and thrombosis of right femoral vein: Secondary | ICD-10-CM | POA: Diagnosis not present

## 2024-05-02 DIAGNOSIS — Z79811 Long term (current) use of aromatase inhibitors: Secondary | ICD-10-CM | POA: Diagnosis not present

## 2024-05-02 DIAGNOSIS — C50912 Malignant neoplasm of unspecified site of left female breast: Secondary | ICD-10-CM

## 2024-05-02 LAB — CBC WITH DIFFERENTIAL (CANCER CENTER ONLY)
Abs Immature Granulocytes: 0.04 K/uL (ref 0.00–0.07)
Basophils Absolute: 0.1 K/uL (ref 0.0–0.1)
Basophils Relative: 2 %
Eosinophils Absolute: 0.1 K/uL (ref 0.0–0.5)
Eosinophils Relative: 2 %
HCT: 34.5 % — ABNORMAL LOW (ref 36.0–46.0)
Hemoglobin: 12.2 g/dL (ref 12.0–15.0)
Immature Granulocytes: 1 %
Lymphocytes Relative: 33 %
Lymphs Abs: 1.2 K/uL (ref 0.7–4.0)
MCH: 38.4 pg — ABNORMAL HIGH (ref 26.0–34.0)
MCHC: 35.4 g/dL (ref 30.0–36.0)
MCV: 108.5 fL — ABNORMAL HIGH (ref 80.0–100.0)
Monocytes Absolute: 0.2 K/uL (ref 0.1–1.0)
Monocytes Relative: 5 %
Neutro Abs: 2.1 K/uL (ref 1.7–7.7)
Neutrophils Relative %: 57 %
Platelet Count: 193 K/uL (ref 150–400)
RBC: 3.18 MIL/uL — ABNORMAL LOW (ref 3.87–5.11)
RDW: 15.1 % (ref 11.5–15.5)
Smear Review: NORMAL
WBC Count: 3.6 K/uL — ABNORMAL LOW (ref 4.0–10.5)
nRBC: 0 % (ref 0.0–0.2)

## 2024-05-02 LAB — CMP (CANCER CENTER ONLY)
ALT: 17 U/L (ref 0–44)
AST: 33 U/L (ref 15–41)
Albumin: 3.9 g/dL (ref 3.5–5.0)
Alkaline Phosphatase: 75 U/L (ref 38–126)
Anion gap: 15 (ref 5–15)
BUN: 14 mg/dL (ref 8–23)
CO2: 20 mmol/L — ABNORMAL LOW (ref 22–32)
Calcium: 9.9 mg/dL (ref 8.9–10.3)
Chloride: 105 mmol/L (ref 98–111)
Creatinine: 1.06 mg/dL — ABNORMAL HIGH (ref 0.44–1.00)
GFR, Estimated: 53 mL/min — ABNORMAL LOW
Glucose, Bld: 194 mg/dL — ABNORMAL HIGH (ref 70–99)
Potassium: 4.2 mmol/L (ref 3.5–5.1)
Sodium: 139 mmol/L (ref 135–145)
Total Bilirubin: 1.2 mg/dL (ref 0.0–1.2)
Total Protein: 7 g/dL (ref 6.5–8.1)

## 2024-05-02 MED ORDER — PALBOCICLIB 75 MG PO TABS
75.0000 mg | ORAL_TABLET | Freq: Every day | ORAL | 3 refills | Status: AC
  Filled 2024-05-02: qty 21, 28d supply, fill #0

## 2024-05-02 MED ORDER — RIVAROXABAN 2.5 MG PO TABS: 20.0000 mg | ORAL_TABLET | Freq: Every day | ORAL | 0 refills | Status: AC

## 2024-05-02 NOTE — Progress Notes (Signed)
 Specialty Pharmacy Refill Coordination Note  Victoria Steele is a 84 y.o. female contacted today regarding refills of specialty medication(s) Palbociclib  (IBRANCE )   Patient requested Delivery   Delivery date: 05/04/24   Verified address: [address]  [place] [address]   Medication will be filled on: 05/03/24    This fill date is pending response to refill request from provider. Patient is aware and if they have not received fill by intended date they must follow up with pharmacy.

## 2024-05-02 NOTE — Patient Instructions (Signed)

## 2024-05-02 NOTE — Progress Notes (Unsigned)
 Patient is feeling dizzy today; requesting refills but would like to speak with Dr. Melanee first.

## 2024-05-02 NOTE — Telephone Encounter (Signed)
 Per patient request xarelto  [facility] [address].  Outbound call to [facility] to cancel previous xarelto  script sent.  Spoke to [facility] who said 30 day supply of xarelto  20mg  tablet daily costs $43.41 and that it's ready for pick up.  Informed not to cancel script just yet and I'd touch base shortly.

## 2024-05-02 NOTE — Telephone Encounter (Signed)
 Spoke to [facility] who confirmed 30 day supply is $43.41.  I asked her if I need to reactivate the order for [facility] since it was already discontinued. Was advised to leave script as is to avoid any complications.

## 2024-05-03 ENCOUNTER — Telehealth: Payer: Self-pay

## 2024-05-03 ENCOUNTER — Other Ambulatory Visit: Payer: Self-pay

## 2024-05-03 LAB — CANCER ANTIGEN 27.29: CA 27.29: 90.7 U/mL — ABNORMAL HIGH (ref 0.0–38.6)

## 2024-05-03 NOTE — Telephone Encounter (Signed)
 Voicemail received from patient today 05/03/24 at 12:21pm requesting a copy of her after visit summary from yesterday, said she didn't get one and needs for her records.  Best call back number is [phone]   Outbound call to patient; patient requested it be mailed out. Informed would mail out today.

## 2024-05-04 ENCOUNTER — Encounter: Payer: Self-pay | Admitting: Oncology

## 2024-05-04 ENCOUNTER — Telehealth: Payer: Self-pay | Admitting: Oncology

## 2024-05-04 NOTE — Telephone Encounter (Signed)
 Called pt to sched CT - checked w/Donna about inj after - [facility]

## 2024-05-04 NOTE — Progress Notes (Signed)
 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  Patient has been on Ibrance  and letrozole  since then.  She had cytopenias with Ibrance  and is taking 75 mg 3 weeks on 1 week off which was started in September 2018.  Patient is on Zometa  for bone metastases which she is getting every 3 months. Patient had extensive RLE DVT in April 2019 and is currently on eliquis . She also underwent venous thrombectomy and thrombolytic therapy and IVC placement by Dr. Marea.  She also has peripheral vascular disease secondary to atherosclerosis and underwent stenting with vascular surgery as well.  Patient was on Zometa  for bone metastases for 5 years and it was later on discontinued   Pain was on Eliquis  for history of prior DVT for 6 years and it was stopped in January 2025 as it was getting expensive for her as well.  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  She has metastatic breast cancer with osseous involvement,  diagnosed prior to 2018, and has been maintained on letrozole  and Ibrance  since that time. Her most recent imaging in August demonstrated stable bone metastases.  She has recurrent venous thromboembolism, with an initial right lower extremity DVT approximately six years ago. Eliquis  was discontinued in January due to cost, after which she developed a new right lower extremity DVT and pulmonary embolism in April. She resumed Eliquis  twice daily and took her last dose tonight. She is currently out of Eliquis  and has not missed any doses prior to today.  She has hypertension with previously elevated blood pressures. Today, her blood pressure was 133/50s. She reports persistent dizziness throughout the day, which she describes as unusual and kind of scary, and has been cautious with ambulation, requiring assistance. She denies chest pain or palpitations. She will continue to monitor her blood pressure at home.       ECOG PS- 1 Pain scale- 0   Review of systems- Review of Systems  Constitutional:  Negative for chills, fever, malaise/fatigue and weight loss.  HENT:  Negative for congestion, ear discharge and nosebleeds.   Eyes:  Negative for blurred vision.  Respiratory:  Negative for cough, hemoptysis, sputum production, shortness of breath and wheezing.   Cardiovascular:  Negative for chest pain, palpitations, orthopnea and claudication.  Gastrointestinal:  Negative for abdominal pain, blood in stool, constipation, diarrhea, heartburn, melena, nausea and vomiting.  Genitourinary:  Negative for dysuria, flank pain, frequency, hematuria and urgency.  Musculoskeletal:  Negative for back pain, joint pain and myalgias.  Skin:  Negative for rash.  Neurological:  Negative for dizziness, tingling, focal  weakness, seizures, weakness and headaches.  Endo/Heme/Allergies:  Does not bruise/bleed easily.  Psychiatric/Behavioral:  Negative for depression and suicidal ideas. The patient does not have insomnia.        Allergies[1]   Past Medical History:  Diagnosis Date   Coronary artery disease involving native coronary artery 08/30/2021   a. 08/2021 Cor CTA: Cor Ca2+ = 788.  Sev mRCA dzs, Mod LAD and LCx dzs; b. 10/2021 PCI: LM nl, LAD 75/pm (3.0x15 Onyx Frontier DES), 68m, LCX 75p (2.5x18 Onyx Frontier DES), RCA 100p/m w/ R->R and L->R collats to distal vessel/RPDA.   Diastolic dysfunction    a. 09/2021 Echo: EF 60-65%, no rwma, GrI DD, nl RV fxn.   DVT (deep venous thrombosis) (HCC)    2019   Hypertension    Left Breast cancer (HCC)    Port catheter in place 09/14/2016   Placed 09/10/2016 RT chest.     Past Surgical History:  Procedure Laterality Date   ABDOMINAL HYSTERECTOMY     APPENDECTOMY     BREAST BIOPSY     CORONARY STENT INTERVENTION N/A 10/16/2021   Procedure: CORONARY STENT INTERVENTION;  Surgeon: Anner Alm ORN, MD;  Location: [facility] INVASIVE CV LAB: Prox LAD 75%->0% Onyx Frontier DES 3 x 15 (3.3); Mid LCx 75% (prox to OM2) -> Onyx Frontier DES 2.5 x 18 -> tapered 2.8 to 2.6 mm   CT CTA CORONARY W/CA SCORE W/CM &/OR WO/CM  09/01/2021   Coronary Calcium  Score 788.  Combination of calcified and noncalcified plaque-severe lesion in mid RCA (estimated greater than 70%).  Moderate LAD and LCx 50 to 69%). => CAD RADS 4 with severe stenosis suggested in the RCA.  FFRCT not submitted due to motion artifact.-Recommended cardiac catheterization.   CYST EXCISION Right 09/2016   Buttocks   IVC FILTER REMOVAL N/A 01/17/2018   Procedure: IVC FILTER REMOVAL;  Surgeon: Marea Selinda RAMAN, MD;  Location: [facility] INVASIVE CV LAB;  Service: Cardiovascular;  Laterality: N/A;   LEFT HEART CATH AND CORONARY ANGIOGRAPHY N/A 10/16/2021   Procedure: LEFT HEART CATH AND CORONARY ANGIOGRAPHY;  Surgeon: Anner Alm ORN, MD;  Location: [facility] INVASIVE CV LAB:: mRCA 100% CTO (L-R  & bridging collaterals); prox LAD 75%( DES PCI) - dLAD-rDPDA colleterals; mLCx 75% just proximal to the OM 2 (DES PCI).  Normal EDP.    LOWER EXTREMITY ANGIOGRAPHY Right 11/11/2017   Procedure: LOWER EXTREMITY ANGIOGRAPHY;  Surgeon: Jama Cordella MATSU, MD;  Location: [facility] INVASIVE CV LAB;  Service: Cardiovascular;  Laterality: Right;   PERIPHERAL VASCULAR THROMBECTOMY Right 08/30/2017   Procedure: PERIPHERAL VASCULAR THROMBECTOMY;  Surgeon: Marea Selinda RAMAN, MD;  Location: [facility] INV[facility] CV LAB;  Service: Cardiovascular;  Laterality: Right;   PORTACATH PLACEMENT Right 09/10/2016   Procedure: INSERTION PORT-A-CATH;  Surgeon: Jordis Laneta FALCON, MD;  Location: [facility] ORS;  Service: General;  Laterality: Right;    Social History   Socioeconomic History   Marital status: Married    Spouse name: Lynwood   Number of children: 2   Years of education: 6   Highest education level: Bachelor's degree (e.g., BA, AB, BS)  Occupational History   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[employer]  Tobacco Use   Smoking status: Never   Smokeless tobacco: Never  Vaping Use   Vaping status: Never Used  Substance and Sexual Activity   Alcohol use: No   Drug use: No   Sexual activity: Never  Other Topics Concern   Not on file  Social  History Narrative   Not on file   Social Drivers of Health   Tobacco Use: Low Risk (05/02/2024)   Patient History    Smoking Tobacco Use: Never    Smokeless Tobacco Use: Never    Passive Exposure: Not on file  Financial Resource Strain: Not on file  Food Insecurity: No Food Insecurity (07/15/2023)   Hunger Vital Sign    Worried About Running Out of Food in the Last Year: Never true    Ran Out of Food in the Last Year: Never true  Transportation Needs: No Transportation Needs (07/15/2023)   PRAPARE - Administrator, Civil Service (Medical): No    Lack of Transportation (Non-Medical): No  Physical Activity: Not on file  Stress: Not on file  Social Connections: Moderately Integrated (07/15/2023)   Social Connection and Isolation Panel    Frequency of Communication with Friends and  Family: More than three times a week    Frequency of Social Gatherings with Friends and Family: More than three times a week    Attends Religious Services: More than 4 times per year    Active Member of [employer] or Organizations: No    Attends Banker Meetings: Never    Marital Status: Married  Catering Manager Violence: Not At Risk (07/15/2023)   Humiliation, Afraid, Rape, and Kick questionnaire    Fear of Current or Ex-Partner: No    Emotionally Abused: No    Physically Abused: No    Sexually Abused: No  Depression (PHQ2-9): Low Risk (05/02/2024)   Depression (PHQ2-9)    PHQ-2 Score: 0  Alcohol Screen: Not on file  Housing: Low Risk (07/15/2023)   Housing Stability Vital Sign    Unable to Pay for Housing in the Last Year: No    Number of Times Moved in the Last Year: 0    Homeless in the Last Year: No  Utilities: Not At Risk (07/15/2023)   AHC Utilities    Threatened with loss of utilities: No  Health Literacy: Not on file    Family History  Problem Relation Age of Onset   Parkinson's disease Mother    Heart disease Father    Bladder Cancer Father    Lung cancer Father    Heart disease Maternal Grandfather     Current Medications[2]  Physical exam:  Vitals:   05/02/24 1319  BP: 133/68  Pulse: 63  Resp: 19  Temp: 99 F (37.2 C)  TempSrc: Tympanic  SpO2: 98%  Weight: 193 lb 1.6 oz (87.6 kg)  Height: 5' 2 (1.575 m)   Physical Exam Cardiovascular:     Rate and Rhythm: Normal rate and regular rhythm.     Heart sounds: Normal heart sounds.  Pulmonary:     Effort: Pulmonary effort is normal.     Breath sounds: Normal breath sounds.  Skin:    General: Skin is warm and dry.  Neurological:     Mental Status: She is alert and oriented to person, place, and time.      I have personally reviewed labs listed below:    Latest Ref Rng & Units 05/02/2024   12:54 PM  CMP  Glucose 70 - 99 mg/dL 805   BUN 8 - 23 mg/dL 14   Creatinine 9.55 - 1.00 mg/dL 8.93    Sodium 864 - 854 mmol/L 139   Potassium 3.5 - 5.1 mmol/L 4.2   Chloride 98 - 111 mmol/L 105   CO2 22 - 32 mmol/L 20  Calcium  8.9 - 10.3 mg/dL 9.9   Total Protein 6.5 - 8.1 g/dL 7.0   Total Bilirubin 0.0 - 1.2 mg/dL 1.2   Alkaline Phos 38 - 126 U/L 75   AST 15 - 41 U/L 33   ALT 0 - 44 U/L 17       Latest Ref Rng & Units 05/02/2024   12:54 PM  CBC  WBC 4.0 - 10.5 K/uL 3.6   Hemoglobin 12.0 - 15.0 g/dL 87.7   Hematocrit 63.9 - 46.0 % 34.5   Platelets 150 - 400 K/uL 193       Assessment and plan- Patient is a 84 y.o. 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 Xarelto  chosen for cost and assistance program access, equally effective as Eliquis . Emphasized uninterrupted anticoagulation. - Transitioned from Eliquis  to Xarelto  due to cost and assistance program availability. - Coordinated expedited access to Xarelto  to prevent therapy interruption. - Instructed to avoid missing anticoagulation for more than a few days. - Confirmed last dose of Eliquis  taken tonight; will initiate Xarelto  as soon as available.  Metastatic breast cancer to bone Disease well-controlled on letrozole  and Ibrance  with stable imaging and no new symptoms since 2018 - Continued letrozole  and Ibrance  therapy. - Deferred repeat imaging for 3-4 months given stable disease and absence of new symptoms.         Visit Diagnosis 1. Chronic deep vein thrombosis (DVT) of femoral vein of right lower extremity (HCC)   2. Drug-induced neutropenia   3. Malignant neoplasm metastatic to bone (HCC)   4. High risk medication use   5. Use of letrozole  (Femara )      Dr. Annah Skene, MD, MPH [facility] [phone] 05/04/2024 2:25 PM                   [1]  Allergies Allergen Reactions   Codeine Rash    hallucinations  [2]  Current Outpatient Medications:    acetaminophen  (TYLENOL ) 500 MG tablet, Take 500 mg by mouth daily as needed for moderate pain or headache. , Disp: , Rfl:    amLODipine  (NORVASC ) 10 MG tablet, Take 10 mg by mouth daily., Disp: , Rfl:    atorvastatin  (LIPITOR) 40 MG tablet, Take 40 mg by mouth at bedtime., Disp: , Rfl:    Calcium  Carb-Cholecalciferol (CALCIUM  600/VITAMIN D3 PO), Take 1 tablet by mouth daily., Disp: , Rfl:    chlorhexidine  (PERIDEX ) 0.12 % solution, Use as directed 15 mLs in the mouth or throat 2 (two) times daily. After breakfast and before bedtime, Disp: , Rfl:    ELIQUIS  5 MG TABS tablet, Take 1 tablet by mouth twice daily, Disp: 90 tablet, Rfl: 0   isosorbide  mononitrate (IMDUR ) 30 MG 24 hr tablet, Take 1 tablet by mouth once daily, Disp: 90 tablet, Rfl: 3   letrozole  (FEMARA ) 2.5 MG tablet, Take 1 tablet by mouth once daily, Disp: 90 tablet, Rfl: 0   lidocaine -prilocaine  (EMLA ) cream, Apply 1 application. topically as needed (port access)., Disp: 30 g, Rfl: 2   losartan  (COZAAR ) 25 MG tablet, Take 25 mg by mouth daily., Disp: , Rfl:    metoprolol  succinate (TOPROL -XL) 25 MG 24 hr tablet, Take 1 tablet by mouth once daily, Disp:  90 tablet, Rfl: 3   nitroGLYCERIN  (NITROSTAT ) 0.4 MG SL tablet, Place 1 tablet (0.4 mg total) under the tongue every 5 (five) minutes x 3 doses as needed for chest pain., Disp: 30 tablet, Rfl: 0   ondansetron  (ZOFRAN ) 4 MG tablet, TAKE 1 TABLET BY MOUTH EVERY 8 HOURS AS NEEDED FOR NAUSEA OR VOMITING, Disp: 30 tablet, Rfl: 0   rivaroxaban  (XARELTO ) 2.5 MG TABS tablet, Take 8 tablets (20 mg total) by mouth daily., Disp: 240 tablet, Rfl: 0   palbociclib  (IBRANCE ) 75 MG tablet, Take 1 tablet (75 mg total) by mouth daily. Take for 21 days on, 7 days off, repeat every 28 days., Disp: 21 tablet, Rfl: 3  "

## 2024-05-09 ENCOUNTER — Telehealth: Payer: Self-pay | Admitting: Pharmacy Technician

## 2024-05-09 NOTE — Telephone Encounter (Signed)
 Patient approved to receive Xarelto  from [place] [facility] [place] PAP program till 04/12/25.  Patient will have to reapply in January of 2027 to continue to have assistance with this medication.  First shipment of medication will be mailed to the patient's home on 05/12/24.  Victoria Steele Patient Services Navigator [facility]

## 2024-07-04 ENCOUNTER — Encounter (INDEPENDENT_AMBULATORY_CARE_PROVIDER_SITE_OTHER)

## 2024-07-04 ENCOUNTER — Ambulatory Visit (INDEPENDENT_AMBULATORY_CARE_PROVIDER_SITE_OTHER): Admitting: Vascular Surgery

## 2024-08-30 ENCOUNTER — Other Ambulatory Visit

## 2024-08-30 ENCOUNTER — Other Ambulatory Visit (HOSPITAL_COMMUNITY)

## 2024-09-13 ENCOUNTER — Inpatient Hospital Stay: Admitting: Oncology

## 2024-09-13 ENCOUNTER — Inpatient Hospital Stay
